# Patient Record
Sex: Female | Born: 1935 | Race: White | Hispanic: No | Marital: Married | Smoking: Former smoker
Health system: Southern US, Community
[De-identification: ages and names within clinical notes are randomized; demographics above are authoritative.]

## PROBLEM LIST (undated history)

## (undated) DIAGNOSIS — Z9229 Personal history of other drug therapy: Secondary | ICD-10-CM

## (undated) DIAGNOSIS — E039 Hypothyroidism, unspecified: Secondary | ICD-10-CM

## (undated) DIAGNOSIS — Z86010 Personal history of colonic polyps: Secondary | ICD-10-CM

## (undated) DIAGNOSIS — I639 Cerebral infarction, unspecified: Secondary | ICD-10-CM

## (undated) DIAGNOSIS — I4891 Unspecified atrial fibrillation: Secondary | ICD-10-CM

## (undated) DIAGNOSIS — M1990 Unspecified osteoarthritis, unspecified site: Secondary | ICD-10-CM

## (undated) DIAGNOSIS — K52831 Collagenous colitis: Secondary | ICD-10-CM

## (undated) DIAGNOSIS — I341 Nonrheumatic mitral (valve) prolapse: Secondary | ICD-10-CM

## (undated) DIAGNOSIS — Z860101 Personal history of adenomatous and serrated colon polyps: Secondary | ICD-10-CM

## (undated) HISTORY — DX: Personal history of adenomatous and serrated colon polyps: Z86.0101

## (undated) HISTORY — DX: Hypothyroidism, unspecified: E03.9

## (undated) HISTORY — DX: Nonrheumatic mitral (valve) prolapse: I34.1

## (undated) HISTORY — DX: Unspecified atrial fibrillation: I48.91

## (undated) HISTORY — DX: Personal history of colonic polyps: Z86.010

## (undated) HISTORY — PX: TONSILLECTOMY: SUR1361

## (undated) HISTORY — DX: Unspecified osteoarthritis, unspecified site: M19.90

## (undated) HISTORY — PX: CHOLECYSTECTOMY: SHX55

## (undated) HISTORY — DX: Collagenous colitis: K52.831

## (undated) HISTORY — DX: Personal history of other drug therapy: Z92.29

## (undated) HISTORY — PX: LUMBAR FUSION: SHX111

## (undated) HISTORY — PX: CATARACT EXTRACTION: SUR2

## (undated) HISTORY — PX: DILATION AND CURETTAGE OF UTERUS: SHX78

## (undated) HISTORY — DX: Cerebral infarction, unspecified: I63.9

---

## 1986-04-28 ENCOUNTER — Encounter (INDEPENDENT_AMBULATORY_CARE_PROVIDER_SITE_OTHER): Payer: Self-pay | Admitting: *Deleted

## 1986-04-30 ENCOUNTER — Encounter (INDEPENDENT_AMBULATORY_CARE_PROVIDER_SITE_OTHER): Payer: Self-pay | Admitting: *Deleted

## 1987-10-05 ENCOUNTER — Encounter (INDEPENDENT_AMBULATORY_CARE_PROVIDER_SITE_OTHER): Payer: Self-pay | Admitting: *Deleted

## 1998-11-08 ENCOUNTER — Encounter: Payer: Self-pay | Admitting: Gastroenterology

## 2002-06-07 ENCOUNTER — Other Ambulatory Visit: Admission: RE | Admit: 2002-06-07 | Discharge: 2002-06-07 | Payer: Self-pay | Admitting: Internal Medicine

## 2003-08-10 ENCOUNTER — Encounter: Admission: RE | Admit: 2003-08-10 | Discharge: 2003-08-10 | Payer: Self-pay | Admitting: Internal Medicine

## 2003-08-22 ENCOUNTER — Encounter: Admission: RE | Admit: 2003-08-22 | Discharge: 2003-08-22 | Payer: Self-pay | Admitting: Internal Medicine

## 2003-11-08 ENCOUNTER — Encounter: Payer: Self-pay | Admitting: Gastroenterology

## 2004-01-18 ENCOUNTER — Ambulatory Visit: Payer: Self-pay | Admitting: Internal Medicine

## 2004-01-30 ENCOUNTER — Ambulatory Visit: Payer: Self-pay | Admitting: Internal Medicine

## 2004-05-17 ENCOUNTER — Ambulatory Visit: Payer: Self-pay | Admitting: Internal Medicine

## 2004-07-17 ENCOUNTER — Ambulatory Visit: Payer: Self-pay | Admitting: Internal Medicine

## 2004-10-17 ENCOUNTER — Ambulatory Visit: Payer: Self-pay | Admitting: Internal Medicine

## 2004-11-06 ENCOUNTER — Inpatient Hospital Stay (HOSPITAL_COMMUNITY): Admission: RE | Admit: 2004-11-06 | Discharge: 2004-11-10 | Payer: Self-pay | Admitting: Orthopaedic Surgery

## 2005-01-17 ENCOUNTER — Ambulatory Visit: Payer: Self-pay | Admitting: Internal Medicine

## 2005-02-20 ENCOUNTER — Ambulatory Visit: Payer: Self-pay | Admitting: Internal Medicine

## 2005-02-20 ENCOUNTER — Other Ambulatory Visit: Admission: RE | Admit: 2005-02-20 | Discharge: 2005-02-20 | Payer: Self-pay | Admitting: Neurosurgery

## 2005-02-20 ENCOUNTER — Encounter: Payer: Self-pay | Admitting: Internal Medicine

## 2005-02-21 ENCOUNTER — Ambulatory Visit: Payer: Self-pay

## 2005-04-23 ENCOUNTER — Ambulatory Visit: Payer: Self-pay | Admitting: Internal Medicine

## 2005-04-28 ENCOUNTER — Ambulatory Visit: Payer: Self-pay | Admitting: Internal Medicine

## 2005-06-24 ENCOUNTER — Ambulatory Visit: Payer: Self-pay | Admitting: Internal Medicine

## 2005-08-22 ENCOUNTER — Encounter: Admission: RE | Admit: 2005-08-22 | Discharge: 2005-08-22 | Payer: Self-pay | Admitting: Internal Medicine

## 2005-08-26 ENCOUNTER — Ambulatory Visit: Payer: Self-pay | Admitting: Internal Medicine

## 2005-09-23 ENCOUNTER — Ambulatory Visit: Payer: Self-pay | Admitting: Internal Medicine

## 2005-10-17 ENCOUNTER — Encounter (INDEPENDENT_AMBULATORY_CARE_PROVIDER_SITE_OTHER): Payer: Self-pay | Admitting: Specialist

## 2005-10-17 ENCOUNTER — Ambulatory Visit (HOSPITAL_COMMUNITY): Admission: RE | Admit: 2005-10-17 | Discharge: 2005-10-17 | Payer: Self-pay | Admitting: Obstetrics and Gynecology

## 2005-12-11 ENCOUNTER — Ambulatory Visit: Payer: Self-pay | Admitting: Internal Medicine

## 2006-01-09 ENCOUNTER — Ambulatory Visit: Payer: Self-pay | Admitting: Internal Medicine

## 2006-02-16 ENCOUNTER — Ambulatory Visit: Payer: Self-pay | Admitting: Internal Medicine

## 2006-02-16 LAB — CONVERTED CEMR LAB
CO2: 29 meq/L (ref 19–32)
Calcium: 9.4 mg/dL (ref 8.4–10.5)
Chloride: 106 meq/L (ref 96–112)
GFR calc non Af Amer: 65 mL/min
Glucose, Bld: 105 mg/dL — ABNORMAL HIGH (ref 70–99)
Phosphorus Concentration: 5 mg/dL — ABNORMAL HIGH (ref 2.3–4.6)
Potassium: 4.4 meq/L (ref 3.5–5.1)
Sodium: 142 meq/L (ref 135–145)

## 2006-02-24 ENCOUNTER — Encounter: Payer: Self-pay | Admitting: Cardiology

## 2006-02-24 ENCOUNTER — Ambulatory Visit: Payer: Self-pay

## 2006-03-17 HISTORY — PX: MITRAL VALVE REPAIR: SHX2039

## 2006-03-30 ENCOUNTER — Ambulatory Visit: Payer: Self-pay | Admitting: Cardiology

## 2006-04-02 ENCOUNTER — Ambulatory Visit: Payer: Self-pay | Admitting: Internal Medicine

## 2006-04-07 ENCOUNTER — Ambulatory Visit: Payer: Self-pay | Admitting: Cardiology

## 2006-04-09 ENCOUNTER — Ambulatory Visit (HOSPITAL_COMMUNITY): Admission: RE | Admit: 2006-04-09 | Discharge: 2006-04-09 | Payer: Self-pay | Admitting: Cardiology

## 2006-04-09 ENCOUNTER — Encounter: Payer: Self-pay | Admitting: Cardiology

## 2006-04-15 ENCOUNTER — Ambulatory Visit: Payer: Self-pay | Admitting: Cardiology

## 2006-04-27 ENCOUNTER — Ambulatory Visit: Payer: Self-pay | Admitting: Cardiology

## 2006-04-27 ENCOUNTER — Ambulatory Visit (HOSPITAL_COMMUNITY): Admission: RE | Admit: 2006-04-27 | Discharge: 2006-04-27 | Payer: Self-pay | Admitting: Cardiovascular Disease

## 2006-04-27 LAB — CONVERTED CEMR LAB: Creatinine, Ser: 0.7 mg/dL (ref 0.4–1.2)

## 2006-05-21 ENCOUNTER — Ambulatory Visit: Payer: Self-pay | Admitting: Internal Medicine

## 2006-07-07 ENCOUNTER — Ambulatory Visit: Payer: Self-pay | Admitting: Internal Medicine

## 2006-07-07 LAB — CONVERTED CEMR LAB
Basophils Absolute: 0 10*3/uL (ref 0.0–0.1)
Basophils Relative: 0.5 % (ref 0.0–1.0)
Eosinophils Absolute: 0.2 10*3/uL (ref 0.0–0.6)
Free T4: 1 ng/dL (ref 0.6–1.6)
HCT: 35 % — ABNORMAL LOW (ref 36.0–46.0)
MCHC: 32.8 g/dL (ref 30.0–36.0)
Monocytes Absolute: 0.5 10*3/uL (ref 0.2–0.7)
Neutro Abs: 2 10*3/uL (ref 1.4–7.7)
Platelets: 308 10*3/uL (ref 150–400)
Potassium: 4.9 meq/L (ref 3.5–5.1)
T3, Free: 5.7 pg/mL — ABNORMAL HIGH (ref 2.3–4.2)
TSH: 0.01 microintl units/mL — ABNORMAL LOW (ref 0.35–5.50)

## 2006-08-11 ENCOUNTER — Ambulatory Visit: Payer: Self-pay | Admitting: Internal Medicine

## 2006-08-12 ENCOUNTER — Ambulatory Visit: Payer: Self-pay | Admitting: Cardiology

## 2006-08-12 LAB — CONVERTED CEMR LAB
Eosinophils Absolute: 0.1 10*3/uL (ref 0.0–0.6)
Eosinophils Relative: 3.7 % (ref 0.0–5.0)
Lymphocytes Relative: 23.4 % (ref 12.0–46.0)
MCHC: 34.4 g/dL (ref 30.0–36.0)
MCV: 92.8 fL (ref 78.0–100.0)
Neutrophils Relative %: 58.7 % (ref 43.0–77.0)
Pap Smear: NORMAL
RBC: 4.07 M/uL (ref 3.87–5.11)
RDW: 12.3 % (ref 11.5–14.6)
WBC: 4 10*3/uL — ABNORMAL LOW (ref 4.5–10.5)

## 2006-09-11 DIAGNOSIS — Z9889 Other specified postprocedural states: Secondary | ICD-10-CM | POA: Insufficient documentation

## 2006-09-11 DIAGNOSIS — M1990 Unspecified osteoarthritis, unspecified site: Secondary | ICD-10-CM | POA: Insufficient documentation

## 2006-09-14 ENCOUNTER — Ambulatory Visit: Payer: Self-pay | Admitting: Internal Medicine

## 2006-09-14 LAB — CONVERTED CEMR LAB
Free T4: 1.1 ng/dL (ref 0.6–1.6)
TSH: <0.03 microintl units/mL — ABNORMAL LOW (ref 0.35–5.50)

## 2006-09-15 ENCOUNTER — Telehealth: Payer: Self-pay | Admitting: *Deleted

## 2006-09-16 ENCOUNTER — Ambulatory Visit: Payer: Self-pay | Admitting: Internal Medicine

## 2006-09-21 ENCOUNTER — Encounter: Payer: Self-pay | Admitting: Internal Medicine

## 2006-09-21 ENCOUNTER — Ambulatory Visit: Payer: Self-pay | Admitting: Internal Medicine

## 2006-10-09 ENCOUNTER — Encounter: Payer: Self-pay | Admitting: Internal Medicine

## 2006-11-06 ENCOUNTER — Ambulatory Visit: Payer: Self-pay | Admitting: Cardiology

## 2006-11-23 ENCOUNTER — Ambulatory Visit: Payer: Self-pay | Admitting: Internal Medicine

## 2006-11-23 DIAGNOSIS — M8580 Other specified disorders of bone density and structure, unspecified site: Secondary | ICD-10-CM | POA: Insufficient documentation

## 2006-11-23 DIAGNOSIS — E039 Hypothyroidism, unspecified: Secondary | ICD-10-CM | POA: Insufficient documentation

## 2006-11-23 LAB — CONVERTED CEMR LAB: T3 Uptake Ratio: 45.9 % — ABNORMAL HIGH (ref 22.5–37.0)

## 2006-12-18 ENCOUNTER — Ambulatory Visit: Payer: Self-pay | Admitting: Internal Medicine

## 2007-02-24 ENCOUNTER — Ambulatory Visit: Payer: Self-pay | Admitting: Internal Medicine

## 2007-02-24 DIAGNOSIS — M19049 Primary osteoarthritis, unspecified hand: Secondary | ICD-10-CM | POA: Insufficient documentation

## 2007-04-01 ENCOUNTER — Ambulatory Visit: Payer: Self-pay | Admitting: Internal Medicine

## 2007-05-25 ENCOUNTER — Encounter: Payer: Self-pay | Admitting: Cardiology

## 2007-05-25 ENCOUNTER — Ambulatory Visit: Payer: Self-pay | Admitting: Cardiology

## 2007-05-25 ENCOUNTER — Ambulatory Visit: Payer: Self-pay

## 2007-06-11 ENCOUNTER — Encounter: Admission: RE | Admit: 2007-06-11 | Discharge: 2007-06-11 | Payer: Self-pay | Admitting: Orthopaedic Surgery

## 2007-07-07 ENCOUNTER — Ambulatory Visit: Payer: Self-pay | Admitting: Internal Medicine

## 2007-07-07 DIAGNOSIS — I480 Paroxysmal atrial fibrillation: Secondary | ICD-10-CM | POA: Insufficient documentation

## 2007-07-07 DIAGNOSIS — I4891 Unspecified atrial fibrillation: Secondary | ICD-10-CM | POA: Insufficient documentation

## 2007-07-07 LAB — CONVERTED CEMR LAB
BUN: 24 mg/dL — ABNORMAL HIGH (ref 6–23)
Chloride: 103 meq/L (ref 96–112)
Creatinine, Ser: 0.8 mg/dL (ref 0.4–1.2)
GFR calc non Af Amer: 75 mL/min
Sodium: 142 meq/L (ref 135–145)

## 2007-08-04 ENCOUNTER — Encounter: Payer: Self-pay | Admitting: Internal Medicine

## 2007-10-15 ENCOUNTER — Encounter: Payer: Self-pay | Admitting: Internal Medicine

## 2007-11-04 ENCOUNTER — Ambulatory Visit: Payer: Self-pay | Admitting: Internal Medicine

## 2007-12-24 ENCOUNTER — Encounter: Admission: RE | Admit: 2007-12-24 | Discharge: 2007-12-24 | Payer: Self-pay | Admitting: Orthopaedic Surgery

## 2007-12-28 ENCOUNTER — Ambulatory Visit: Payer: Self-pay | Admitting: Internal Medicine

## 2008-01-25 ENCOUNTER — Encounter: Payer: Self-pay | Admitting: Internal Medicine

## 2008-01-31 ENCOUNTER — Ambulatory Visit: Payer: Self-pay | Admitting: Internal Medicine

## 2008-01-31 LAB — CONVERTED CEMR LAB
BUN: 27 mg/dL — ABNORMAL HIGH (ref 6–23)
CO2: 28 meq/L (ref 19–32)
Calcium: 9.6 mg/dL (ref 8.4–10.5)
Chloride: 106 meq/L (ref 96–112)
Creatinine, Ser: 0.8 mg/dL (ref 0.4–1.2)
Free T4: 2.8 ng/dL — ABNORMAL HIGH (ref 0.6–1.6)
GFR calc Af Amer: 90 mL/min
GFR calc non Af Amer: 75 mL/min
Glucose, Bld: 95 mg/dL (ref 70–99)
Potassium: 4.2 meq/L (ref 3.5–5.1)
Sodium: 141 meq/L (ref 135–145)
T3, Free: 3.6 pg/mL (ref 2.3–4.2)
TSH: 0.22 microintl units/mL — ABNORMAL LOW (ref 0.35–5.50)
Vit D, 1,25-Dihydroxy: 51 (ref 30–89)

## 2008-03-28 ENCOUNTER — Telehealth (INDEPENDENT_AMBULATORY_CARE_PROVIDER_SITE_OTHER): Payer: Self-pay | Admitting: *Deleted

## 2008-05-31 ENCOUNTER — Ambulatory Visit: Payer: Self-pay | Admitting: Internal Medicine

## 2008-05-31 DIAGNOSIS — L02519 Cutaneous abscess of unspecified hand: Secondary | ICD-10-CM | POA: Insufficient documentation

## 2008-05-31 DIAGNOSIS — L03119 Cellulitis of unspecified part of limb: Secondary | ICD-10-CM

## 2008-06-13 ENCOUNTER — Telehealth: Payer: Self-pay | Admitting: Internal Medicine

## 2008-06-29 ENCOUNTER — Ambulatory Visit: Payer: Self-pay | Admitting: Internal Medicine

## 2008-06-30 ENCOUNTER — Encounter: Payer: Self-pay | Admitting: Internal Medicine

## 2008-07-20 ENCOUNTER — Telehealth: Payer: Self-pay | Admitting: Internal Medicine

## 2008-07-21 ENCOUNTER — Encounter: Payer: Self-pay | Admitting: Internal Medicine

## 2008-07-27 ENCOUNTER — Telehealth: Payer: Self-pay | Admitting: Internal Medicine

## 2008-07-28 ENCOUNTER — Encounter: Payer: Self-pay | Admitting: Internal Medicine

## 2008-08-01 ENCOUNTER — Telehealth: Payer: Self-pay | Admitting: Speech Pathology

## 2008-08-29 ENCOUNTER — Ambulatory Visit: Payer: Self-pay | Admitting: Internal Medicine

## 2008-08-29 LAB — CONVERTED CEMR LAB: TSH: 0.11 microintl units/mL — ABNORMAL LOW (ref 0.35–5.50)

## 2008-09-05 ENCOUNTER — Ambulatory Visit: Payer: Self-pay | Admitting: Internal Medicine

## 2008-09-05 DIAGNOSIS — J309 Allergic rhinitis, unspecified: Secondary | ICD-10-CM | POA: Insufficient documentation

## 2008-09-14 ENCOUNTER — Encounter (INDEPENDENT_AMBULATORY_CARE_PROVIDER_SITE_OTHER): Payer: Self-pay | Admitting: *Deleted

## 2008-09-26 ENCOUNTER — Telehealth: Payer: Self-pay | Admitting: Cardiology

## 2008-10-16 ENCOUNTER — Encounter: Payer: Self-pay | Admitting: Internal Medicine

## 2008-10-17 ENCOUNTER — Ambulatory Visit: Payer: Self-pay | Admitting: Gastroenterology

## 2008-10-23 ENCOUNTER — Ambulatory Visit: Payer: Self-pay | Admitting: Internal Medicine

## 2008-10-23 ENCOUNTER — Encounter: Payer: Self-pay | Admitting: Internal Medicine

## 2008-10-23 LAB — HM DEXA SCAN

## 2008-11-06 ENCOUNTER — Encounter: Payer: Self-pay | Admitting: Cardiology

## 2008-11-06 ENCOUNTER — Ambulatory Visit: Payer: Self-pay

## 2008-11-07 ENCOUNTER — Ambulatory Visit: Payer: Self-pay | Admitting: Gastroenterology

## 2008-11-09 ENCOUNTER — Ambulatory Visit: Payer: Self-pay | Admitting: Cardiology

## 2008-12-26 ENCOUNTER — Ambulatory Visit: Payer: Self-pay | Admitting: Internal Medicine

## 2008-12-26 LAB — CONVERTED CEMR LAB
ALT: 17 units/L (ref 0–35)
Alkaline Phosphatase: 68 units/L (ref 39–117)
Basophils Absolute: 0 10*3/uL (ref 0.0–0.1)
Basophils Relative: 0.8 % (ref 0.0–3.0)
Bilirubin, Direct: 0.1 mg/dL (ref 0.0–0.3)
CO2: 30 meq/L (ref 19–32)
Direct LDL: 146.9 mg/dL
Eosinophils Absolute: 0.1 10*3/uL (ref 0.0–0.7)
GFR calc non Af Amer: 74.21 mL/min (ref >60)
Glucose, Bld: 104 mg/dL — ABNORMAL HIGH (ref 70–99)
Glucose, Urine, Semiquant: NEGATIVE
HCT: 41 % (ref 36.0–46.0)
Lymphs Abs: 1.3 10*3/uL (ref 0.7–4.0)
MCV: 101.5 fL — ABNORMAL HIGH (ref 78.0–100.0)
Monocytes Absolute: 0.4 10*3/uL (ref 0.1–1.0)
Neutro Abs: 2.5 10*3/uL (ref 1.4–7.7)
Neutrophils Relative %: 57.8 % (ref 43.0–77.0)
Platelets: 218 10*3/uL (ref 150.0–400.0)
RBC: 4.04 M/uL (ref 3.87–5.11)
RDW: 13.1 % (ref 11.5–14.6)
Sodium: 143 meq/L (ref 135–145)
Total Protein: 7.2 g/dL (ref 6.0–8.3)
Triglycerides: 97 mg/dL (ref 0.0–149.0)
Urobilinogen, UA: 0.2
VLDL: 19.4 mg/dL (ref 0.0–40.0)
pH: 5.5

## 2009-01-03 ENCOUNTER — Ambulatory Visit: Payer: Self-pay | Admitting: Internal Medicine

## 2009-01-03 DIAGNOSIS — M4120 Other idiopathic scoliosis, site unspecified: Secondary | ICD-10-CM | POA: Insufficient documentation

## 2009-03-06 ENCOUNTER — Ambulatory Visit: Payer: Self-pay | Admitting: Internal Medicine

## 2009-03-06 DIAGNOSIS — I7300 Raynaud's syndrome without gangrene: Secondary | ICD-10-CM | POA: Insufficient documentation

## 2009-04-17 ENCOUNTER — Ambulatory Visit: Payer: Self-pay | Admitting: Internal Medicine

## 2009-04-17 DIAGNOSIS — B354 Tinea corporis: Secondary | ICD-10-CM | POA: Insufficient documentation

## 2009-06-04 ENCOUNTER — Ambulatory Visit: Payer: Self-pay | Admitting: Internal Medicine

## 2009-06-04 DIAGNOSIS — K219 Gastro-esophageal reflux disease without esophagitis: Secondary | ICD-10-CM | POA: Insufficient documentation

## 2009-06-04 LAB — CONVERTED CEMR LAB
Free T4: 2.7 ng/dL — ABNORMAL HIGH (ref 0.6–1.6)
T3, Free: 3 pg/mL (ref 2.3–4.2)
TSH: 0.12 microintl units/mL — ABNORMAL LOW (ref 0.35–5.50)

## 2009-07-02 ENCOUNTER — Telehealth: Payer: Self-pay | Admitting: Internal Medicine

## 2009-07-17 ENCOUNTER — Telehealth: Payer: Self-pay | Admitting: Internal Medicine

## 2009-07-20 ENCOUNTER — Encounter: Payer: Self-pay | Admitting: Internal Medicine

## 2009-07-23 ENCOUNTER — Telehealth: Payer: Self-pay | Admitting: Internal Medicine

## 2009-07-26 ENCOUNTER — Telehealth: Payer: Self-pay | Admitting: Internal Medicine

## 2009-08-15 ENCOUNTER — Telehealth: Payer: Self-pay | Admitting: Internal Medicine

## 2009-08-27 ENCOUNTER — Telehealth: Payer: Self-pay | Admitting: Internal Medicine

## 2009-08-28 ENCOUNTER — Ambulatory Visit: Payer: Self-pay | Admitting: Internal Medicine

## 2009-08-30 ENCOUNTER — Encounter: Payer: Self-pay | Admitting: Internal Medicine

## 2009-09-03 ENCOUNTER — Telehealth (INDEPENDENT_AMBULATORY_CARE_PROVIDER_SITE_OTHER): Payer: Self-pay | Admitting: *Deleted

## 2009-09-03 DIAGNOSIS — R197 Diarrhea, unspecified: Secondary | ICD-10-CM | POA: Insufficient documentation

## 2009-09-05 ENCOUNTER — Ambulatory Visit: Payer: Self-pay | Admitting: Gastroenterology

## 2009-09-05 ENCOUNTER — Emergency Department (HOSPITAL_COMMUNITY): Admission: EM | Admit: 2009-09-05 | Discharge: 2009-09-05 | Payer: Self-pay | Admitting: Emergency Medicine

## 2009-09-05 ENCOUNTER — Telehealth (INDEPENDENT_AMBULATORY_CARE_PROVIDER_SITE_OTHER): Payer: Self-pay | Admitting: *Deleted

## 2009-09-05 ENCOUNTER — Encounter: Payer: Self-pay | Admitting: Cardiology

## 2009-09-05 ENCOUNTER — Encounter: Payer: Self-pay | Admitting: Nurse Practitioner

## 2009-09-05 ENCOUNTER — Ambulatory Visit: Payer: Self-pay | Admitting: Cardiovascular Disease

## 2009-09-10 ENCOUNTER — Telehealth: Payer: Self-pay | Admitting: Nurse Practitioner

## 2009-09-11 ENCOUNTER — Ambulatory Visit: Payer: Self-pay | Admitting: Nurse Practitioner

## 2009-09-11 ENCOUNTER — Encounter: Payer: Self-pay | Admitting: Internal Medicine

## 2009-09-11 LAB — CONVERTED CEMR LAB
IgA: 127 mg/dL (ref 68–378)
Tissue Transglutaminase Ab, IgA: 5.7 units (ref <20)

## 2009-09-19 ENCOUNTER — Encounter (INDEPENDENT_AMBULATORY_CARE_PROVIDER_SITE_OTHER): Payer: Self-pay | Admitting: *Deleted

## 2009-09-20 ENCOUNTER — Telehealth: Payer: Self-pay | Admitting: Nurse Practitioner

## 2009-09-20 ENCOUNTER — Encounter (INDEPENDENT_AMBULATORY_CARE_PROVIDER_SITE_OTHER): Payer: Self-pay | Admitting: *Deleted

## 2009-09-20 DIAGNOSIS — R198 Other specified symptoms and signs involving the digestive system and abdomen: Secondary | ICD-10-CM | POA: Insufficient documentation

## 2009-09-21 ENCOUNTER — Ambulatory Visit: Payer: Self-pay | Admitting: Gastroenterology

## 2009-09-24 ENCOUNTER — Ambulatory Visit: Payer: Self-pay | Admitting: Cardiology

## 2009-09-25 ENCOUNTER — Encounter: Payer: Self-pay | Admitting: Internal Medicine

## 2009-09-27 ENCOUNTER — Ambulatory Visit: Payer: Self-pay | Admitting: Gastroenterology

## 2009-09-28 ENCOUNTER — Telehealth: Payer: Self-pay | Admitting: Gastroenterology

## 2009-10-01 ENCOUNTER — Encounter: Payer: Self-pay | Admitting: Gastroenterology

## 2009-10-02 ENCOUNTER — Encounter: Payer: Self-pay | Admitting: Gastroenterology

## 2009-10-11 ENCOUNTER — Encounter: Payer: Self-pay | Admitting: Cardiology

## 2009-10-11 ENCOUNTER — Ambulatory Visit: Payer: Self-pay

## 2009-10-11 ENCOUNTER — Ambulatory Visit (HOSPITAL_COMMUNITY): Admission: RE | Admit: 2009-10-11 | Discharge: 2009-10-11 | Payer: Self-pay | Admitting: Cardiology

## 2009-10-11 ENCOUNTER — Encounter (INDEPENDENT_AMBULATORY_CARE_PROVIDER_SITE_OTHER): Payer: Self-pay | Admitting: *Deleted

## 2009-10-11 ENCOUNTER — Ambulatory Visit: Payer: Self-pay | Admitting: Cardiology

## 2009-10-22 ENCOUNTER — Ambulatory Visit: Payer: Self-pay | Admitting: Gastroenterology

## 2009-10-22 DIAGNOSIS — Z860100 Personal history of colon polyps, unspecified: Secondary | ICD-10-CM | POA: Insufficient documentation

## 2009-10-22 DIAGNOSIS — K5289 Other specified noninfective gastroenteritis and colitis: Secondary | ICD-10-CM | POA: Insufficient documentation

## 2009-10-29 ENCOUNTER — Encounter: Payer: Self-pay | Admitting: Internal Medicine

## 2009-11-26 ENCOUNTER — Ambulatory Visit: Payer: Self-pay | Admitting: Cardiology

## 2009-12-03 ENCOUNTER — Ambulatory Visit: Payer: Self-pay | Admitting: Cardiovascular Disease

## 2009-12-03 LAB — CONVERTED CEMR LAB: INR: 2.8

## 2009-12-10 ENCOUNTER — Ambulatory Visit: Payer: Self-pay | Admitting: Cardiovascular Disease

## 2009-12-17 ENCOUNTER — Ambulatory Visit: Payer: Self-pay | Admitting: Internal Medicine

## 2009-12-17 LAB — CONVERTED CEMR LAB: POC INR: 2.9

## 2009-12-19 ENCOUNTER — Ambulatory Visit: Payer: Self-pay | Admitting: Internal Medicine

## 2009-12-19 LAB — CONVERTED CEMR LAB
AST: 32 units/L (ref 0–37)
Albumin: 3.9 g/dL (ref 3.5–5.2)
Alkaline Phosphatase: 77 units/L (ref 39–117)
Basophils Absolute: 0.1 10*3/uL (ref 0.0–0.1)
Bilirubin, Direct: 0.1 mg/dL (ref 0.0–0.3)
Blood in Urine, dipstick: NEGATIVE
Calcium: 9.8 mg/dL (ref 8.4–10.5)
Cholesterol: 246 mg/dL — ABNORMAL HIGH (ref 0–200)
Creatinine, Ser: 0.9 mg/dL (ref 0.4–1.2)
Eosinophils Absolute: 0.2 10*3/uL (ref 0.0–0.7)
GFR calc non Af Amer: 69.02 mL/min (ref >60)
Glucose, Bld: 93 mg/dL (ref 70–99)
HDL: 79.3 mg/dL (ref >39.00)
Hemoglobin: 12.9 g/dL (ref 12.0–15.0)
Lymphocytes Relative: 24.9 % (ref 12.0–46.0)
Monocytes Relative: 8.6 % (ref 3.0–12.0)
Neutro Abs: 4.1 10*3/uL (ref 1.4–7.7)
Neutrophils Relative %: 62.7 % (ref 43.0–77.0)
Nitrite: NEGATIVE
Protein, U semiquant: NEGATIVE
RBC: 3.76 M/uL — ABNORMAL LOW (ref 3.87–5.11)
RDW: 13.5 % (ref 11.5–14.6)
Sodium: 145 meq/L (ref 135–145)
Total CHOL/HDL Ratio: 3
Triglycerides: 77 mg/dL (ref 0.0–149.0)
Urobilinogen, UA: 0.2

## 2009-12-24 ENCOUNTER — Ambulatory Visit: Payer: Self-pay | Admitting: Cardiology

## 2009-12-24 LAB — CONVERTED CEMR LAB: POC INR: 2.4

## 2009-12-26 ENCOUNTER — Ambulatory Visit: Payer: Self-pay | Admitting: Internal Medicine

## 2010-01-07 ENCOUNTER — Ambulatory Visit: Payer: Self-pay | Admitting: Internal Medicine

## 2010-01-07 LAB — CONVERTED CEMR LAB: POC INR: 2.6

## 2010-02-04 ENCOUNTER — Ambulatory Visit: Payer: Self-pay | Admitting: Internal Medicine

## 2010-02-04 LAB — CONVERTED CEMR LAB: INR: 3.1

## 2010-02-25 ENCOUNTER — Telehealth: Payer: Self-pay | Admitting: Internal Medicine

## 2010-03-04 ENCOUNTER — Ambulatory Visit: Payer: Self-pay | Admitting: Internal Medicine

## 2010-03-26 ENCOUNTER — Ambulatory Visit
Admission: RE | Admit: 2010-03-26 | Discharge: 2010-03-26 | Payer: Self-pay | Source: Home / Self Care | Attending: Internal Medicine | Admitting: Internal Medicine

## 2010-04-07 ENCOUNTER — Encounter: Payer: Self-pay | Admitting: Cardiovascular Disease

## 2010-04-08 ENCOUNTER — Ambulatory Visit
Admission: RE | Admit: 2010-04-08 | Discharge: 2010-04-08 | Payer: Self-pay | Source: Home / Self Care | Attending: Internal Medicine | Admitting: Internal Medicine

## 2010-04-08 LAB — CONVERTED CEMR LAB: INR: 2.2

## 2010-04-16 NOTE — Progress Notes (Signed)
Summary: stool culture results.  Phone Note Call from Patient   Caller: Patient Call For: Stacie Glaze MD Reason for Call: Privacy/Consent Authorization Summary of Call: Pt is asking for stool results. [phone] Is still having diarrhea. Initial call taken by: Lynann Beaver CMA,  Jul 23, 2009 11:11 AM  Follow-up for Phone Call        per dr Lovell Sheehan- all neg an-start colesit 1scoop once daily  Follow-up by: Willy Eddy, LPN,  Jul 24, 8839 11:30 AM    New/Updated Medications: COLESTID 5 GM PACK (COLESTIPOL HCL) 1 pack once daily Prescriptions: COLESTID 5 GM PACK (COLESTIPOL HCL) 1 pack once daily  #30 x 3   Entered by:   Willy Eddy, LPN   Authorized by:   Stacie Glaze MD   Signed by:   Willy Eddy, LPN on 66/08/3014   Method used:   Electronically to        [facility]* (retail)       [address] [place], [place]  [address]       Ph: [phone]       Fax: [phone]   RxID:   [id number]

## 2010-04-16 NOTE — Progress Notes (Signed)
Summary: Diarrhea  Phone Note From Other Clinic   Caller: [facility] @ Dr Lovell Sheehan Call For: Dr Russella Dar Summary of Call: Diarrhea for 3 or 23month. Would like pt seen this week. Call back to [place] at [phone] Initial call taken by: Leanor Kail [facility],  September 03, 2009 11:23 AM  Follow-up for Phone Call        scheduled to see Willette Cluster RNP 09/05/09 1:30 Follow-up by: Darcey Lee-Ann RN, CGRN,  September 03, 2009 11:52 AM

## 2010-04-16 NOTE — Miscellaneous (Signed)
Summary: gi med entocort  Clinical Lists Changes  Medications: Added new medication of ENTOCORT EC 3 MG  CP24 (BUDESONIDE) Take 3 capsules daily - Signed Rx of ENTOCORT EC 3 MG  CP24 (BUDESONIDE) Take 3 capsules daily;  #90 x 2;  Signed;  Entered by: Eual Fines RN;  Authorized by: Meryl Dare MD Clementeen Graham;  Method used: Electronically to [facility]*, [address] [place], [place], [place]  [address], Ph: [phone], Fax: [phone] Observations: Added new observation of ALLERGY REV: Done (10/02/2009 13:20)    Prescriptions: ENTOCORT EC 3 MG  CP24 (BUDESONIDE) Take 3 capsules daily  #90 x 2   Entered by:   Eual Fines RN   Authorized by:   Meryl Dare MD [facility]   Signed by:   Eual Fines RN on 10/02/2009   Method used:   Electronically to        [facility]* (retail)       [address] [place], [place]  [address]       Ph: [phone]       Fax: [phone]   RxID:   [id number]

## 2010-04-16 NOTE — Progress Notes (Signed)
Summary: sooner appt  Phone Note Call from Patient Call back at Home Phone [phone]   Caller: Patient Call For: Theresa [name] Reason for Call: Talk to Nurse Summary of Call: Patient states that Dr Theresa [name] wanted to see her for a 2-3 weeks f.u after procedure but his first available is 8-31. Initial call taken by: Tawni Levy,  September 28, 2009 4:08 PM  Follow-up for Phone Call        rev scheduled for 10/22/09 10:15.  patient aware Follow-up by: Darcey Nikie RN, CGRN,  October 01, 2009 8:28 AM

## 2010-04-16 NOTE — Progress Notes (Signed)
Summary: Schedule colonoscopy   Celiac testing negative. Proceed with colonoscopy with biopsies.   ---- Converted from flag ---- ---- 09/20/2009 2:21 PM, Lowry Ram NCMA wrote:   ---- 09/13/2009 1:06 PM, Willette Cluster NP wrote: Elita Quick, please get let patient know this plan. Thanks  ---- 09/11/2009 9:02 AM, Meryl Dare MD [facility] wrote: Await celiac panel, and if negative and diarrhea persists, would proceed to colonoscopy as lymphocytic colitis/collagenous colitis can be missed with left sided biopsies from a flex sig.  ---- 09/11/2009 12:05 AM, Willette Cluster NP wrote: This is your patient, please see my office note. I plan to get celiac panel and if negative set her up for flex with biopsies. Your thoughts ??? Thanks ------------------------------  Appended Document: Schedule colonoscopy Made pt appt for Previsit for 09-21-09 Rm 50  at 10:00 AM.  Made appt for the Colonoscopy for 09-27-09 at 3:30 PM with Dr. Claudette Head.  Pt aware.

## 2010-04-16 NOTE — Letter (Signed)
Summary: [facility] Instructions  [facility]  [address] [place], [place] [address]   Phone: [phone]  Fax: [phone]       ATLANTIS DELONG    10/15/1931    MRN: [id number]        Procedure Day /Date:  Thursday 09/27/2009     Arrival Time: 2:30 pm      Procedure Time:  3:30 pm     Location of Procedure:                    _ x_  [facility] (4th Floor)                        PREPARATION FOR COLONOSCOPY WITH MOVIPREP   Starting 5 days prior to your procedure Saturday 7/9 do not eat nuts, seeds, popcorn, corn, beans, peas,  salads, or any raw vegetables.  Do not take any fiber supplements (e.g. Metamucil, Citrucel, and Benefiber).  THE DAY BEFORE YOUR PROCEDURE         DATE: Wednesday 7/13  1.  Drink clear liquids the entire day-NO SOLID FOOD  2.  Do not drink anything colored red or purple.  Avoid juices with pulp.  No orange juice.  3.  Drink at least 64 oz. (8 glasses) of fluid/clear liquids during the day to prevent dehydration and help the prep work efficiently.  CLEAR LIQUIDS INCLUDE: Water Jello Ice Popsicles Tea (sugar ok, no milk/cream) Powdered fruit flavored drinks Coffee (sugar ok, no milk/cream) Gatorade Juice: apple, white grape, white cranberry  Lemonade Clear bullion, consomm, broth Carbonated beverages (any kind) Strained chicken noodle soup Hard Candy                             4.  In the morning, mix first dose of MoviPrep solution:    Empty 1 Pouch A and 1 Pouch B into the disposable container    Add lukewarm drinking water to the top line of the container. Mix to dissolve    Refrigerate (mixed solution should be used within 24 hrs)  5.  Begin drinking the prep at 5:00 p.m. The MoviPrep container is divided by 4 marks.   Every 15 minutes drink the solution down to the next mark (approximately 8 oz) until the full liter is complete.   6.  Follow completed prep with 16 oz of clear liquid of your choice  (Nothing red or purple).  Continue to drink clear liquids until bedtime.  7.  Before going to bed, mix second dose of MoviPrep solution:    Empty 1 Pouch A and 1 Pouch B into the disposable container    Add lukewarm drinking water to the top line of the container. Mix to dissolve    Refrigerate  THE DAY OF YOUR PROCEDURE      DATE: Thursday 7/14  Beginning at 10:30 a.m. (5 hours before procedure):         1. Every 15 minutes, drink the solution down to the next mark (approx 8 oz) until the full liter is complete.  2. Follow completed prep with 16 oz. of clear liquid of your choice.    3. You may drink clear liquids until 1:30 pm (2 HOURS BEFORE PROCEDURE).   MEDICATION INSTRUCTIONS  Unless otherwise instructed, you should take regular prescription medications with a small sip of water   as early as possible the morning  of your procedure.           OTHER INSTRUCTIONS  You will need a responsible adult at least 75 years of age to accompany you and drive you home.   This person must remain in the waiting room during your procedure.  Wear loose fitting clothing that is easily removed.  Leave jewelry and other valuables at home.  However, you may wish to bring a book to read or  an iPod/MP3 player to listen to music as you wait for your procedure to start.  Remove all body piercing jewelry and leave at home.  Total time from sign-in until discharge is approximately 2-3 hours.  You should go home directly after your procedure and rest.  You can resume normal activities the  day after your procedure.  The day of your procedure you should not:   Drive   Make legal decisions   Operate machinery   Drink alcohol   Return to work  You will receive specific instructions about eating, activities and medications before you leave.    The above instructions have been reviewed and explained to me by   Ezra Sites RN  September 21, 2009 10:22 AM     I fully understand  and can verbalize these instructions _____________________________ Date _________

## 2010-04-16 NOTE — Assessment & Plan Note (Signed)
Summary: 3 MNTH ROV//SLM   Vital Signs:  Patient profile:   77 year old female Height:      62 inches Weight:      117 pounds BMI:     21.48 Temp:     98.4 degrees F rectal Pulse rate:   72 / minute Resp:     14 per minute BP sitting:   116 / 70  (left arm)  Vitals Entered By: Willy Eddy, LPN (June 04, 2009 10:41 AM) CC: roa, Abdominal Pain   Primary Care Provider:  Stacie Glaze MD  CC:  roa and Abdominal Pain.  History of Present Illness: monitering for the OA and had finger pain thjat was relieved with the shots the pt has been stable otherwize  Dyspepsia History:      She has no alarm features of dyspepsia including no history of melena, hematochezia, dysphagia, persistent vomiting, or involuntary weight loss > 5%.  There is a prior history of GERD.  She notes that she has never had an upper endoscopy or GI consultation.     Preventive Screening-Counseling & Management  Alcohol-Tobacco     Smoking Status: quit     Year Quit: 1962  Problems Prior to Update: 1)  Dermatophytosis of The Body  (ICD-110.5) 2)  Raynaud's Syndrome  (ICD-443.0) 3)  Scoliosis, Lumbar Spine  (ICD-737.30) 4)  Preventive Health Care  (ICD-V70.0) 5)  Vasomotor Rhinitis  (ICD-477.9) 6)  Cellulitis, Hand, Left  (ICD-682.4) 7)  Osteoarthritis, Hands, Bilateral  (ICD-715.94) 8)  Osteoporosis  (ICD-733.00) 9)  Hypothyroidism  (ICD-244.9) 10)  Mitral Regurgitation  (ICD-424.0) 11)  Mitral Valve Prolapse  (ICD-424.0) 12)  Osteoarthritis  (ICD-715.90) 13)  Hx of Atrial Fibrillation  (ICD-427.31)  Current Problems (verified): 1)  Dermatophytosis of The Body  (ICD-110.5) 2)  Raynaud's Syndrome  (ICD-443.0) 3)  Scoliosis, Lumbar Spine  (ICD-737.30) 4)  Preventive Health Care  (ICD-V70.0) 5)  Vasomotor Rhinitis  (ICD-477.9) 6)  Cellulitis, Hand, Left  (ICD-682.4) 7)  Osteoarthritis, Hands, Bilateral  (ICD-715.94) 8)  Osteoporosis  (ICD-733.00) 9)  Hypothyroidism  (ICD-244.9) 10)   Mitral Regurgitation  (ICD-424.0) 11)  Mitral Valve Prolapse  (ICD-424.0) 12)  Osteoarthritis  (ICD-715.90) 13)  Hx of Atrial Fibrillation  (ICD-427.31)  Medications Prior to Update: 1)  Synthroid 100 Mcg  Tabs (Levothyroxine Sodium) .... Take 1 Tablet By Mouth Once A Day 2)  Cleocin-T 1 % Lotn (Clindamycin Phosphate) .... Apply As Directed 3)  Caltrate 600+d   Tabs (Calcium Carbonate-Vitamin D Tabs) .... Take 1 Tablet By Mouth Two Times A Day 4)  Aspirin 325  Mg  Tabs (Aspirin) .... Take 2 Tablet By Mouth Two Times A Day 5)  Triveen-Cf Nac 6-2-600 Mg Tabs (Methylfol-Methylcob-Acetylcyst) .... One P0 Daily ( Hold For Her To Calls) 6)  Fish Oil   Caps (Omega-3 Fatty Acids Caps) .... Take 1 By Mouth Two Times A Day Qd 7)  Prilosec 20 Mg  Cpdr (Omeprazole) .... One By Mouth Q Od 8)  Metoprolol Succinate 50 Mg  Tb24 (Metoprolol Succinate) .... One in Am and 1/2 in Pm 9)  Glucosamine-Chondroitin 500-400 Mg  Caps (Glucosamine-Chondroitin) .... Two Times A Day 10)  Vitamin D 1000 Unit  Caps (Cholecalciferol) .... Once Daily 11)  Ultram 50 Mg  Tabs (Tramadol Hcl) .... One By Mouth Q 6 Hours As Needed Pain 12)  Muscadine Pus .... Daily 13)  Vitamin C 500 Mg  Tabs (Ascorbic Acid) .Marland Kitchen.. 1 Tab Once Daily 14)  Vitamin  A .... 1 Tab Once Daily 15)  Multi V .... 1 Tab Once Daily 16)  Cyclobenzaprine Hcl 5 Mg Tabs (Cyclobenzaprine Hcl) .... One By Mouth Q Hs 17)  Fluconazole 100 Mg Tabs (Fluconazole) .... One By Mouth Daily For 10 Day  Current Medications (verified): 1)  Synthroid 100 Mcg  Tabs (Levothyroxine Sodium) .... Take 1 Tablet By Mouth Once A Day 2)  Cleocin-T 1 % Lotn (Clindamycin Phosphate) .... Apply As Directed 3)  Caltrate 600+d   Tabs (Calcium Carbonate-Vitamin D Tabs) .... Take 1 Tablet By Mouth Two Times A Day 4)  Aspirin 325  Mg  Tabs (Aspirin) .... Take 2 Tablet By Mouth Two Times A Day 5)  Triveen-Cf Nac 6-2-600 Mg Tabs (Methylfol-Methylcob-Acetylcyst) .... One P0 Daily ( Hold For Her  To Calls) 6)  Fish Oil   Caps (Omega-3 Fatty Acids Caps) .... Take 1 By Mouth Two Times A Day Qd 7)  Prilosec 20 Mg  Cpdr (Omeprazole) .... One By Mouth Q Od 8)  Metoprolol Succinate 50 Mg  Tb24 (Metoprolol Succinate) .... One in Am and 1/2 in Pm 9)  Glucosamine-Chondroitin 500-400 Mg  Caps (Glucosamine-Chondroitin) .... Two Times A Day 10)  Vitamin D 1000 Unit  Caps (Cholecalciferol) .... Once Daily 11)  Ultram 50 Mg  Tabs (Tramadol Hcl) .... One By Mouth Q 6 Hours As Needed Pain 12)  Muscadine Pus .... Daily 13)  Vitamin C 500 Mg  Tabs (Ascorbic Acid) .Marland Kitchen.. 1 Tab Once Daily 14)  Vitamin A .Marland Kitchen.. 1 Tab Once Daily 15)  Multi V .... 1 Tab Once Daily 16)  Cyclobenzaprine Hcl 5 Mg Tabs (Cyclobenzaprine Hcl) .... One By Mouth Q Hs  Allergies (verified): 1)  ! Vioxx 2)  ! * Voltaren Eye Gtts. and Gel  Past History:  Family History: Last updated: 09/11/2006 Family History of Stroke F 1st degree relative <60  Social History: Last updated: 09/11/2006 Married Former Smoker  Risk Factors: Smoking Status: quit (06/04/2009)  Past medical, surgical, family and social histories (including risk factors) reviewed, and no changes noted (except as noted below).  Past Medical History: Reviewed history from 11/08/2008 and no changes required. Atrial fibrillation Osteoarthritis HRT-ovarian MVP Hypothyroidism Osteoporosis  1. Myxomatous degeneration of the mitral valve, status post mitral     valve repair with a good result and now stable. 2. Postoperative atrial fibrillation with no evidence of recurrence.   Past Surgical History: Reviewed history from 05/31/2008 and no changes required. Cholecystectomy Lumbar fusion Tonsillectomy GYN surgery  D andC  Cataract extraction mitral valve repair  2008  Family History: Reviewed history from 09/11/2006 and no changes required. Family History of Stroke F 1st degree relative <60  Social History: Reviewed history from 09/11/2006 and no  changes required. Married Former Smoker  Review of Systems  The patient denies anorexia, fever, weight loss, weight gain, vision loss, decreased hearing, hoarseness, chest pain, syncope, dyspnea on exertion, peripheral edema, prolonged cough, headaches, hemoptysis, abdominal pain, melena, hematochezia, severe indigestion/heartburn, hematuria, incontinence, genital sores, muscle weakness, suspicious skin lesions, transient blindness, difficulty walking, depression, unusual weight change, abnormal bleeding, enlarged lymph nodes, angioedema, and breast masses.    Physical Exam  General:  alert and pale.   Head:  normocephalic and atraumatic.   Eyes:  pupils equal and pupils round.   Ears:  R ear normal and L ear normal.   Nose:  no external deformity and no nasal discharge.   Mouth:  good dentition and pharynx pink and moist.  Neck:  supple.   Lungs:  normal respiratory effort and no wheezes.   Heart:  normal rate, regular rhythm, and no murmur!!!!! Abdomen:  soft and non-tender.   Msk:  decreased ROM, joint tenderness, joint swelling, and redness over joint.   Pulses:  R and L carotid,radial,femoral,dorsalis pedis and posterior tibial pulses are full and equal bilaterally Extremities:  No clubbing, cyanosis, edema, or deformity noted with normal full range of motion of all joints.   Neurologic:  alert & oriented X3 and finger-to-nose normal.     Impression & Recommendations:  Problem # 1:  OSTEOARTHRITIS, HANDS, BILATERAL (ICD-715.94) aspirin 350 two by mouth two times a day  Her updated medication list for this problem includes:    Ultram 50 Mg Tabs (Tramadol hcl) ..... One by mouth q 6 hours as needed pain  Discussed use of medications, application of heat or cold, and exercises.   Problem # 2:  HYPOTHYROIDISM (ICD-244.9)  Her updated medication list for this problem includes:    Synthroid 100 Mcg Tabs (Levothyroxine sodium) .Marland Kitchen... Take 1 tablet by mouth once a day  Labs  Reviewed: TSH: 0.14 (12/26/2008)   Free T4: 1.2 (08/29/2008)    Chol: 252 (12/26/2008)   HDL: 82.40 (12/26/2008)   TG: 97.0 (12/26/2008)  Orders: Venipuncture (24268) TLB-TSH (Thyroid Stimulating Hormone) (84443-TSH) TLB-T4 (Thyrox), Free [phone]) TLB-T3, Free (Triiodothyronine) (84481-T3FREE)  Problem # 3:  MITRAL REGURGITATION (ICD-424.0) post valve surgery Her updated medication list for this problem includes:    Metoprolol Succinate 50 Mg Tb24 (Metoprolol succinate) ..... One in am and 1/2 in pm  Problem # 4:  GASTROESOPHAGEAL REFLUX DISEASE, MILD (ICD-530.81)  Her updated medication list for this problem includes:    Prilosec 20 Mg Cpdr (Omeprazole) ..... One by mouth q od  change with put problems  Complete Medication List: 1)  Synthroid 100 Mcg Tabs (Levothyroxine sodium) .... Take 1 tablet by mouth once a day 2)  Cleocin-t 1 % Lotn (Clindamycin phosphate) .... Apply as directed 3)  Caltrate 600+d Tabs (Calcium carbonate-vitamin d tabs) .... Take 1 tablet by mouth two times a day 4)  Aspirin 325 Mg Tabs (aspirin)  .... Take 2 tablet by mouth two times a day 5)  Triveen-cf Nac 6-2-600 Mg Tabs (Methylfol-methylcob-acetylcyst) .... One p0 daily ( hold for her to calls) 6)  Fish Oil Caps (Omega-3 fatty acids caps) .... Take 1 by mouth two times a day qd 7)  Prilosec 20 Mg Cpdr (Omeprazole) .... One by mouth q od 8)  Metoprolol Succinate 50 Mg Tb24 (Metoprolol succinate) .... One in am and 1/2 in pm 9)  Glucosamine-chondroitin 500-400 Mg Caps (Glucosamine-chondroitin) .... Two times a day 10)  Vitamin D 1000 Unit Caps (Cholecalciferol) .... Once daily 11)  Ultram 50 Mg Tabs (Tramadol hcl) .... One by mouth q 6 hours as needed pain 12)  Muscadine Pus  .... Daily 13)  Vitamin C 500 Mg Tabs (Ascorbic acid) .Marland Kitchen.. 1 tab once daily 14)  Vitamin A  .Marland Kitchen.. 1 tab once daily 15)  Multi V  .... 1 tab once daily 16)  Cyclobenzaprine Hcl 5 Mg Tabs (Cyclobenzaprine hcl) .... One by mouth q  hs  Other Orders: T-Vitamin D (25-Hydroxy) [id number])  Patient Instructions: 1)  If we have a flair of  the eyes Dr Dareen Piano has taken over Dr Renaldo Reel office   OCT CPX

## 2010-04-16 NOTE — Medication Information (Signed)
Summary: rov/jaj  Anticoagulant Therapy  Managed by: Bethena Midget, RN, BSN Referring MD: Juanda Chance PCP: Stacie Glaze MD Supervising MD: Gala Romney MD, Daniel Indication 1: Atrial Fibrillation Lab Used: [facility] [place] Site: [address] INR POC 2.9 INR RANGE 2.0-3.0  Dietary changes: no    Health status changes: yes       Details: having intermittent diarrhea  Bleeding/hemorrhagic complications: no    Recent/future hospitalizations: no    Any changes in medication regimen? no    Recent/future dental: yes     Details: routine cleaning  Any missed doses?: no       Is patient compliant with meds? yes       Allergies: 1)  ! Vioxx 2)  ! * Voltaren Eye Gtts. and Gel  Anticoagulation Management History:      The patient is taking warfarin and comes in today for a routine follow up visit.  Positive risk factors for bleeding include an age of 75 years or older.  The bleeding index is 'intermediate risk'.  Positive CHADS2 values include Age > 75 years old.  Her last INR was 2.8.  Anticoagulation responsible Jocelyn Nold: Bensimhon MD, Reuel Boom.  INR POC: 2.9.  Cuvette Lot#: [id number].  Exp: 01/2011.    Anticoagulation Management Assessment/Plan:      The patient's current anticoagulation dose is Coumadin 5 mg tabs: 1 once daily AS DIRECTED.  The target INR is 2.0-3.0.  The next INR is due 12/24/2009.  Anticoagulation instructions were given to patient.  Results were reviewed/authorized by Bethena Midget, RN, BSN.  She was notified by Ilean Skill D candidate.         Prior Anticoagulation Instructions: INR 4.5  Do not take any Coumadin tomorrow (Tuesday) or Wednesday. Then change dose to 1 tablet everyday except take 1/2 tablet on Tuesdays and Saturdays. Re-check INR in 1 week.   Current Anticoagulation Instructions: INR 2.9  Changed dose to 1/2 tablet on Thursday. Pt will now take 1 tablet everyday except 1/2 tablet on Tuesday, Thursday, and Saturday.

## 2010-04-16 NOTE — Medication Information (Signed)
Summary: rov/cs  Anticoagulant Therapy  Managed by: Inactive Referring MD: Juanda Chance PCP: Stacie Glaze MD Supervising MD: Johney Frame MD, Fayrene Fearing Indication 1: Atrial Fibrillation Lab Used: [facility] [place] Site: [address] INR POC 2.6 INR RANGE 2.0-3.0  Dietary changes: no    Health status changes: no    Bleeding/hemorrhagic complications: no    Recent/future hospitalizations: no    Any changes in medication regimen? yes       Details: Took Amoxicillin 4 tabs prior to dental appt  Recent/future dental: no  Any missed doses?: no       Is patient compliant with meds? yes       Allergies: 1)  ! Vioxx 2)  ! * Voltaren Eye Gtts. and Gel  Anticoagulation Management History:      The patient is taking warfarin and comes in today for a routine follow up visit.  Positive risk factors for bleeding include an age of 75 years or older.  The bleeding index is 'intermediate risk'.  Positive CHADS2 values include Age > 75 years old.  Her last INR was 2.8.  Anticoagulation responsible provider: Chin Wachter MD, Fayrene Fearing.  INR POC: 2.6.  Cuvette Lot#: [id number].  Exp: 01/2011.    Anticoagulation Management Assessment/Plan:      The patient's current anticoagulation dose is Coumadin 5 mg tabs: 1 once daily m,w,f,su--1/2 tue,thur, and sat.  The target INR is 2.0-3.0.  The next INR is due 02/04/2010.  Anticoagulation instructions were given to patient.  Results were reviewed/authorized by Inactive.  She was notified by Bethena Midget, RN, BSN.         Prior Anticoagulation Instructions: INR 2.4  Continue Coumadin as scheduled:  1 tablet every day of the week, except 1/2 tablet on Tuesday, Thursday, and Saturday.  Return to clinic in 2 weeks.    Current Anticoagulation Instructions: INR 2.6 Continue 5mg s daily except 2.5mg s on Tuesdays, Thursdays and Saturdays. Recheck in 4 weeks at [facility] office- Dr Lovell Sheehan at 215pm

## 2010-04-16 NOTE — Miscellaneous (Signed)
  Clinical Lists Changes  Medications: Added new medication of COUMADIN 5 MG TABS (WARFARIN SODIUM) 1 once daily AS DIRECTED - Signed Rx of COUMADIN 5 MG TABS (WARFARIN SODIUM) 1 once daily AS DIRECTED;  #30 x 2;  Signed;  Entered by: Scherrie Bateman, LPN;  Authorized by: Lenoria Farrier, MD, [facility];  Method used: Electronically to [facility]*, [address] [place], [place], [place]  [address], Ph: [phone], Fax: [phone]    Prescriptions: COUMADIN 5 MG TABS (WARFARIN SODIUM) 1 once daily AS DIRECTED  #30 x 2   Entered by:   Scherrie Bateman, LPN   Authorized by:   Lenoria Farrier, MD, [facility]   Signed by:   Scherrie Bateman, LPN on 13/10/6576   Method used:   Electronically to        [facility]* (retail)       [address] [place], [place]  [address]       Ph: [phone]       Fax: [phone]   RxID:   [id number]

## 2010-04-16 NOTE — Assessment & Plan Note (Signed)
Summary: rov per pt call/post echo/lg   Visit Type:  Follow-up Referring Provider:  Peri Jefferson Primary Provider:  Stacie Glaze MD  CC:  no complaints.  History of Present Illness: The patient is 77 years old and returns for management of valvular heart disease and atrial fibrillation. In 2008 she had mitral valve repair at [facility] for mitral regurgitation. Postoperatively she had atrial fibrillation which resolved. She did have one history of atrial fibrillation in the remote past prior to this. She did well after that. On September 25, 1999 she had an episode of atrial fibrillation with a fast heart rate which was asymptomatic and which was noted in the GI clinic. She went to the emergency department and converted with diltiazem and was sent home on diltiazem in addition to her metoprolol. I saw her back in followup and she had no recurrence at that time. We ordered an echocardiogram prior to her visit today. She has had no recurrences since her last visit as far as she can tell although she was asymptomatic with her last recurrence.  We did not put her on Coumadin since he was 12 one. She also drank caffeine prior to this episode which may have been a contributing factor. Her echocardiogram prior to this visit showed an ejection fraction of 55-60%. There was mild to moderate MR which hadn't changed from her last echocardiogram.  She also has collagenous colitis which is managed by Dr. Russella Dar.  She met Dr. Eden Emms during her recent emergency room visit him I can very much. We will arrange followup with Dr. Eden Emms in 6 months.  Current Medications (verified): 1)  Synthroid 100 Mcg  Tabs (Levothyroxine Sodium) .... Take 1 Tablet By Mouth Once A Day 2)  Cleocin-T 1 % Lotn (Clindamycin Phosphate) .... Apply As Directed 3)  Caltrate 600+d   Tabs (Calcium Carbonate-Vitamin D Tabs) .... Take 1 Tablet By Mouth Two Times A Day 4)  Neurpath-B 3-35-2 Mg Tabs (L-Methylfolate-B6-B12) .Marland Kitchen.. 1 Once  Daily 5)  Fish Oil   Caps (Omega-3 Fatty Acids Caps) .... Take 1 By Mouth Two Times A Day Qd 6)  Prilosec 20 Mg  Cpdr (Omeprazole) .... One By Mouth Q Od 7)  Metoprolol Succinate 50 Mg Xr24h-Tab (Metoprolol Succinate) .... Take 1 Tablet  Twice A Day 8)  Glucosamine-Chondroitin 500-400 Mg  Caps (Glucosamine-Chondroitin) .... Two Times A Day 9)  Vitamin C 500 Mg  Tabs (Ascorbic Acid) .Marland Kitchen.. 1 Tab Once Daily 10)  Vitamin A .Marland Kitchen.. 1 Tab Once Daily 11)  Multi V .... 1 Tab Once Daily 12)  Cyclobenzaprine Hcl 5 Mg Tabs (Cyclobenzaprine Hcl) .... One By Mouth Q Hs 13)  Vitamin D 2000 Unit Tabs (Cholecalciferol) .... Once Daily 14)  Etodolac 400 Mg Tabs (Etodolac) .Marland Kitchen.. 1 By Mouth Two Times A Day 15)  Diltiazem Hcl Er Beads 120 Mg Xr24h-Cap (Diltiazem Hcl Er Beads) .... Take One Capsule By Mouth Daily 16)  Pa Resveratrol 250 Mg Caps (Resveratrol) .Marland Kitchen.. 1 By Mouth Once Daily 17)  Aspirin 81 Mg Tbec (Aspirin) .... Take One Tablet By Mouth Daily  Allergies (verified): 1)  ! Vioxx 2)  ! * Voltaren Eye Gtts. and Gel  Past History:  Past Medical History: Reviewed history from 10/22/2009 and no changes required. Atrial fibrillation Osteoarthritis HRT-ovarian MVP Hypothyroidism Osteoporosis 1. Myxomatous degeneration of the mitral valve, status post mitral     valve repair with a good result and now stable. 2. Postoperative atrial fibrillation with no evidence of recurrence.  Hemorrhoids Adenomatous Colon Polyps 04/1986 Collagenous colitis  Review of Systems       ROS is negative except as outlined in HPI.   Vital Signs:  Patient profile:   77 year old female Height:      62 inches Weight:      115 pounds BMI:     21.11 Pulse rate:   92 / minute BP sitting:   126 / 75  (left arm) Cuff size:   regular  Vitals Entered By: Burnett Kanaris, CNA (November 26, 2009 3:19 PM)  Physical Exam  Additional Exam:  Gen. Well-nourished, in no distress   Neck: No JVD, thyroid not enlarged, no carotid  bruits Lungs: No tachypnea, clear without rales, rhonchi or wheezes Cardiovascular: Rhythm regular, PMI not displaced,  heart sounds  normal, no murmurs or gallops, no peripheral edema, pulses normal in all 4 extremities. Abdomen: BS normal, abdomen soft and non-tender without masses or organomegaly, no hepatosplenomegaly. MS: No deformities, no cyanosis or clubbing   Neuro:  No focal sns   Skin:  no lesions    Impression & Recommendations:  Problem # 1:  MITRAL REGURGITATION (ICD-424.0)  She is status post mitral valve repair for mitral regurgitation. A recent echocardiogram shows mild to moderate regurgitation which hasn't changed from her previous echocardiograms. I could not hear a murmur today. I was concerned that the atrial fibrillation might be an indication of some worsening problem with her bowel but this does not appear to be the case. Her updated medication list for this problem includes:    Metoprolol Succinate 100 Mg Xr24h-tab (Metoprolol succinate) .Marland Kitchen... 1 1/2 tab qd  Her updated medication list for this problem includes:    Metoprolol Succinate 100 Mg Xr24h-tab (Metoprolol succinate) .Marland Kitchen... 1 1/2 tab qd  Problem # 2:  Hx of ATRIAL FIBRILLATION (ICD-427.31) She has had paroxysmal fibrillation. As far as we know she hasn't had any recurrence although she was asymptomatic with her previous episode. She is currently on diltiazem and metoprolol for this. Her resting heart rate is fast and I think we'll try to simplify her medications by increasing her metoprolol to control both her resting heart rate and has prophylactics against recurrent atrial fibrillation with fast heart rate. We will DC her diltiazem as we increase her metoprolol.  She is CHAD2 score = 1 and [place] Vasc score = 3 which means risks of stroke is about 3% per year. I did not discuss this on her visit today but I think with this wrist she may be better with Coumadin. She will call and discuss this with her. I will  check with Dr. Russella Dar to see if her colitis puts her at an increased risk of bleeding.  Her updated medication list for this problem includes:    Metoprolol Succinate 50 Mg Xr24h-tab (Metoprolol succinate) .Marland Kitchen... Take 1 tablet  twice a day    Aspirin 81 Mg Tbec (Aspirin) .Marland Kitchen... Take one tablet by mouth daily  Patient Instructions: 1)  Your physician recommends that you schedule a follow-up appointment in: 6 MONTHS WITH DR Eden Emms 2)  Your physician has recommended you make the following change in your medication:  3)  STOP DILTIAZEM 4)  INCREASE METOPROLOL TO 150 MG once daily Prescriptions: METOPROLOL SUCCINATE 100 MG XR24H-TAB (METOPROLOL SUCCINATE) 1 1/2 TAB QD  #135 x 4   Entered by:   Scherrie Bateman, LPN   Authorized by:   Lenoria Farrier, MD, [facility]   Signed by:   Scherrie Bateman,  LPN on 56/21/3086   Method used:   Faxed to ...       [facility])             , [place]         Ph:        Fax: [phone]   RxID:   [id number]   Appended Document: rov per pt call/post echo/lg Based on her [place] Vasc score I think it would be best to treat her with Coumadin. I discussed this with her by phone and her she is agreeable. We will start her on 5 mg of Coumadin daily and have her seen in our Coumadin clinic about 5 days later. I told her there may be other options as I Coumadin and we can consider in the future.

## 2010-04-16 NOTE — Procedures (Signed)
Summary: Soil scientist   Imported By: Sherian Rein 10/22/2009 14:13:02  _____________________________________________________________________  External Attachment:    Type:   Image     Comment:   External Document

## 2010-04-16 NOTE — Progress Notes (Signed)
Summary: lab results    Appended Document: lab results Called pt and advised her that Per Dr Russella Dar and Willette Cluster NP we should order a Colonoscopy. Scheduled the Colonoscopy for 7-14 11. Previsit scheduled for Fri 09-21-09 at 10Am.   Gave order to Dwan Bolt to [employer].

## 2010-04-16 NOTE — Progress Notes (Signed)
Summary: stools loose & watery since April  Phone Note Call from Patient Call back at [facility] Phone [phone]   Summary of Call: Still same problem with bowels, loose stools about 3 during night large amounts fluid very fragmented matter & disintegrates & several small times am. Normal discomfort before bms.   Eating as normal cereal & fruit, sandwich lunch, vegetables & meat supper.  Metronizadole taken, medicines stopped levothyroxine 2d & 1/2 day, back to 1 day now, colestipol no difference & stopped.  Urinating & don't feel bad.  Doesn't drink juice & only milk is in cereal.  Last colon within last couple years normal, Dr. Russella Dar.  Wonders about vit & supp causing.  Evening primorse for thin hair.  Just found on internet that it can cause loose stools.  Will stop it.  Others taking long time.  This started April 6 & no better since.  It doesn't bother her that much days.  [facility] for short term.  [facility] others.  NKDA.  Initial call taken by: Rudy Jew, RN,  August 27, 2009 9:43 AM  Follow-up for Phone Call        agree with holding vitalmin and monitering referral to dr stark iof this does not help repeat stool sudies ( complete)  again Follow-up by: Stacie Glaze MD,  August 27, 2009 2:08 PM  Additional Follow-up for Phone Call Additional follow up Details #1::        Phone Call Completed Additional Follow-up by: Rudy Jew, RN,  August 27, 2009 2:18 PM

## 2010-04-16 NOTE — Procedures (Signed)
Summary: Colonoscopy  Patient: Theresa [name] Note: All result statuses are Final unless otherwise noted.  Tests: (1) Colonoscopy (COL)   COL Colonoscopy           DONE     [facility]     [address]     [place], [place]  [address]           COLONOSCOPY PROCEDURE REPORT     PATIENT:  Theresa [name]  MR#:  [id number]     BIRTHDATE:  10/23/1931, 78 yrs. old  GENDER:  female     ENDOSCOPIST:  Judie Petit T. Russella Dar, MD, [facility]           PROCEDURE DATE:  09/27/2009     PROCEDURE:  Colonoscopy with biopsy     ASA CLASS:  Class II     INDICATIONS:  1) unexplained diarrhea  2) follow-up of polyp  3)     family history of colon cancer adenomatous polyp,1998. Father had     colon cancer.     MEDICATIONS:   Fentanyl 75 mcg IV, Versed 6 mg IV     DESCRIPTION OF PROCEDURE:   After the risks benefits and     alternatives of the procedure were thoroughly explained, informed     consent was obtained.  Digital rectal exam was performed and     revealed no abnormalities.   The LB [id number]-[id number] endoscope     was introduced through the anus and advanced to the terminal ileum     which was intubated for a short distance, without limitations.     The quality of the prep was good, using MoviPrep.  The instrument     was then slowly withdrawn as the colon was fully examined.     <<PROCEDUREIMAGES>>     FINDINGS:  A sessile polyp was found in the sigmoid colon. It was     4 mm in size. The polyp was removed using cold biopsy forceps.  A     normal appearing cecum, ileocecal valve, and appendiceal orifice     were identified. The ascending, hepatic flexure, transverse,     splenic flexure, descending  colon, and rectum appeared     unremarkable. Random biopsies were obtained and sent to pathology.     The terminal ileum appeared normal. Retroflexed views in the     rectum revealed no abnormalities.  The time to cecum =  1.67     minutes. The scope was then withdrawn (time =  8  min) from the     patient and the procedure completed.           COMPLICATIONS:  None           ENDOSCOPIC IMPRESSION:     1) 4 mm sessile polyp in the sigmoid colon     2) Normal terminal ileum     RECOMMENDATIONS:     1) Await pathology results     2) Repeat Colonoscopy in 5 years pending pathology review     3) call office next 1-3 days to schedule followup visit in 2-3     weeks     4) Imodium 1-2 po hs prn           Mindi Akerson T. Russella Dar, MD, [facility]           CC: Stacie Glaze, MD           n.     Rosalie DoctorVenita Lick. Russella Dar  at 09/27/2009 03:44 PM           Marin Comment, [phone]  Note: An exclamation mark (!) indicates a result that was not dispersed into the flowsheet. Document Creation Date: 09/27/2009 3:45 PM _______________________________________________________________________  (1) Order result status: Final Collection or observation date-time: 09/27/2009 15:39 Requested date-time:  Receipt date-time:  Reported date-time:  Referring Physician:   Ordering Physician: Claudette Head [phone]) Specimen Source:  Source: Launa Grill Order Number: [id number] Lab site:   Appended Document: Colonoscopy     Procedures Next Due Date:    Colonoscopy: 09/2014

## 2010-04-16 NOTE — Assessment & Plan Note (Signed)
Summary: diarrhea/sheri   History of Present Illness Visit Type: Follow-up Visit Primary GI MD: Elie Goody MD [facility] Primary Provider: Stacie Glaze MD Requesting Provider: Peri Jefferson Chief Complaint: Diarrhea has been going on since April, was up three times in the night last night. History of Present Illness:   Patient followed by Dr. Russella Dar for history of colon polyps. Her last colonoscopy was Aug. 2010. She is here for evaluation of diarrhea which started April 6th. Prior to that BMs were normal at 1-2 times a day. Her diarrhea is nocturnal  and in the mornings. Usually has 3 episodes during the night and up to six, low volume stools in the morning. She has associated urgency but no cramps. No nausea. No rectal bleeding. Recent 4-5 pound weight loss. Feels fine during the day. No recent medication or dietary changes. Tried Citrucel, it helped but not significantly. PCP gave patient 10 days course of Flagyl but that didn't help. Patient stopped ASA and Lodine for several days but didn't make a difference. Stool studies including C-Diff x1, Culture, O&P, giardia, and cryptosporidium were negative. Stool lactoferrin positive.    GI Review of Systems    Reports abdominal pain.      Denies acid reflux, belching, bloating, chest pain, dysphagia with liquids, dysphagia with solids, heartburn, loss of appetite, nausea, vomiting, vomiting blood, weight loss, and  weight gain.      Reports change in bowel habits and  diarrhea.     Denies anal fissure, black tarry stools, constipation, diverticulosis, fecal incontinence, heme positive stool, hemorrhoids, irritable bowel syndrome, jaundice, light color stool, liver problems, rectal bleeding, and  rectal pain. Preventive Screening-Counseling & Management      Drug Use:  no.      Current Medications (verified): 1)  Synthroid 100 Mcg  Tabs (Levothyroxine Sodium) .... Take 1 Tablet By Mouth Once A Day 2)  Cleocin-T 1 % Lotn (Clindamycin  Phosphate) .... Apply As Directed 3)  Caltrate 600+d   Tabs (Calcium Carbonate-Vitamin D Tabs) .... Take 1 Tablet By Mouth Two Times A Day 4)  Triveen-Cf Nac 6-2-600 Mg Tabs (Methylfol-Methylcob-Acetylcyst) .... One P0 Daily ( Hold For Her To Calls) 5)  Fish Oil   Caps (Omega-3 Fatty Acids Caps) .... Take 1 By Mouth Two Times A Day Qd 6)  Prilosec 20 Mg  Cpdr (Omeprazole) .... One By Mouth Q Od 7)  Metoprolol Succinate 50 Mg  Tb24 (Metoprolol Succinate) .... One in Am and 1/2 in Pm 8)  Glucosamine-Chondroitin 500-400 Mg  Caps (Glucosamine-Chondroitin) .... Two Times A Day 9)  Vitamin D 1000 Unit  Caps (Cholecalciferol) .... Once Daily 10)  [place] Pus .... Daily 11)  Vitamin C 500 Mg  Tabs (Ascorbic Acid) .Marland Kitchen.. 1 Tab Once Daily 12)  Vitamin A .Marland Kitchen.. 1 Tab Once Daily 13)  Multi V .... 1 Tab Once Daily 14)  Cyclobenzaprine Hcl 5 Mg Tabs (Cyclobenzaprine Hcl) .... One By Mouth Q Hs 15)  Vitamin D 2000 Unit Tabs (Cholecalciferol) .... Once Daily 16)  Etodolac 400 Mg Tabs (Etodolac) .Marland Kitchen.. 1 By Mouth Two Times A Day  Allergies (verified): 1)  ! Vioxx 2)  ! * Voltaren Eye Gtts. and Gel  Past History:  Past Medical History: Reviewed history from 11/08/2008 and no changes required. Atrial fibrillation Osteoarthritis HRT-ovarian MVP Hypothyroidism Osteoporosis  1. Myxomatous degeneration of the mitral valve, status post mitral     valve repair with a good result and now stable. 2. Postoperative atrial fibrillation with  no evidence of recurrence.   Past Surgical History: Reviewed history from 05/31/2008 and no changes required. Cholecystectomy Lumbar fusion Tonsillectomy GYN surgery  D andC  Cataract extraction mitral valve repair  2008  Family History: Family History of Stroke F 1st degree relative <60 Family History of Colon Cancer: Father had colon surgery for cancer  Social History: Married Former Smoker Alcohol Use - yes wine daily Illicit Drug Use - no Occupation:   retired Drug Use:  no  Review of Systems       The patient complains of arthritis/joint pain and sleeping problems.    Vital Signs:  Patient profile:   77 year old female Height:      62 inches Weight:      114 pounds BMI:     20.93 BSA:     1.51 Pulse rate:   128 / minute Pulse rhythm:   regular BP sitting:   132 / 84  (right arm)  Vitals Entered By: Merri Ray CMA Duncan Dull) (September 05, 2009 1:32 PM)  Physical Exam  General:  Well developed, well nourished, no acute distress. Head:  Normocephalic and atraumatic. Eyes:  Conjunctiva pink, no icterus.  Mouth:  No oral lesions. Tongue moist.  Neck:  no obvious masses  Lungs:  Clear throughout to auscultation. Heart:  Regular rate and rhythm; no murmurs, rubs,  or bruits. Abdomen:  Abdomen soft, nontender, nondistended. No obvious masses or hepatomegaly.Normal bowel sounds.  Msk:  Symmetrical with no gross deformities. Normal posture. Extremities:  No palmar erythema, no edema.  Neurologic:  Alert and  oriented x4;  grossly normal neurologically. Skin:  Intact without significant lesions or rashes. Cervical Nodes:  No significant cervical adenopathy. Psych:  Alert and cooperative. Normal mood and affect.   Impression & Recommendations:  Problem # 1:  DIARRHEA (ICD-787.91) Assessment New Over two month history of nocturnal and morningtime diarrhea with mild weight loss. Stool studies negative except for lactoferrin. No response to Flagyl. In March her TSH and Free T4 were abnormal suggesting over replacement. Apparently Synthroid held for few days at the time. Rule out hyperthyroidism especially given tachycardia. Rule out celiac. If above negative she may need flexible sigmoidoscopy with random biopsies.   Problem # 2:  TACHYCARDIA (ICD-785.0) Assessment: Deteriorated Heart rate 128-148 in office now. No dizziness, chest pain or SOB-looks okay. No signs of dehydration (not orthostatic, mucous membranes moist. I called  her cardiologist Dr. Eden Emms, we did an EKG at his request and patient found to be in rapid atrial flutter. Dr. Eden Emms advised patient go to ER for rate control and admission by [facility]. Patient was sent to ER. We will arrange for our labs to be done while in the hospital or patient can come back to our lab when acute issues resolve. Patient's husband in waiting room and will take her to ER now.  Orthostatics: sitting pulse 100    and BP   130/68 laying pulse 120    and BP   132/98 Standing pulse 120 and BP 132/100

## 2010-04-16 NOTE — Progress Notes (Signed)
Summary: next step  Phone Note Call from Patient Call back at Home Phone [phone]   Caller: Patient Call For: Dr. Russella Dar Reason for Call: Talk to Nurse Summary of Call: would like to know "where to go from here" after having labs, etc. Initial call taken by: Vallarie Mare,  September 10, 2009 3:29 PM  Follow-up for Phone Call        Patient  wants to know the next step.  Willette Cluster RNP I have pasted the TSH and T4 into her chart please review and advise next step.  She wants to know what the next step is. Follow-up by: Darcey Leslie RN, CGRN,  September 10, 2009 3:57 PM  Additional Follow-up for Phone Call Additional follow up Details #1::        Thyroid okay. Please have her come for celiac panel and if negative then I need to talk with her over telephone about next step. Thanks Additional Follow-up by: Willette Cluster NP,  September 11, 2009 9:17 AM    Additional Follow-up for Phone Call Additional follow up Details #2::    Patient  aware, she will come today for celiac panel.  Follow-up by: Darcey Malya RN, CGRN,  September 11, 2009 9:24 AM

## 2010-04-16 NOTE — Procedures (Signed)
Summary: Colonoscopy/[place]  Colonoscopy/   Imported By: Sherian Rein 10/22/2009 14:11:46  _____________________________________________________________________  External Attachment:    Type:   Image     Comment:   External Document

## 2010-04-16 NOTE — Progress Notes (Signed)
Summary: diarrhea x 3-4 weeks  Phone Note Call from Patient   Caller: Patient Call For: Stacie Glaze MD Reason for Call: Acute Illness, Privacy/Consent Authorization Summary of Call: Pt is still having diarrhea x 3- 4 weeks.  Appetite is fine.  After taking Flagyl, episodes got fewer but still having 4-6 episodes of diarrhea daily. Pt is off ASA and Lodine.  Needs RX for arthritis pain.  [employer]. [phone] Initial call taken by: Lynann Beaver CMA,  Jul 17, 2009 10:49 AM  Follow-up for Phone Call        stool studies-ova and parasite, c&s,c-diff,giardia, and tthen questran 1 scoop two times a day- may have tramadol 1 every 4-6 hours as needed pain.   #30 per dr Lovell Sheehan Follow-up by: Willy Eddy, LPN,  Jul 17, 1912 3:59 PM    New/Updated Medications: TRAMADOL HCL 50 MG TABS (TRAMADOL HCL) one by mouth q 4-6 hrs as needed as needed pain Prescriptions: TRAMADOL HCL 50 MG TABS (TRAMADOL HCL) one by mouth q 4-6 hrs as needed as needed pain  #30 x 0   Entered by:   Lynann Beaver CMA   Authorized by:   Stacie Glaze MD   Signed by:   Lynann Beaver CMA on 07/17/2009   Method used:   Electronically to        [facility]* (retail)       [address] [place], [place]  [address]       Ph: [phone]       Fax: [phone]   RxID:   [id number]  Pt. notified.

## 2010-04-16 NOTE — Assessment & Plan Note (Signed)
Summary: follow up colon/sheri   History of Present Illness Visit Type: Follow-up Visit Primary GI MD: Elie Goody MD [facility] Primary Kavi Almquist: Stacie Glaze MD Requesting Mahamed Zalewski: Peri Jefferson Chief Complaint: follow-up colonoscopy History of Present Illness:   Theresa [name], returns today with complete resolution of her diarrhea after starting Entocort. Collagenous colitis was found on biopsies at colonoscopy. She states that after 2-3 days on Entocort her diarrhea completely abated and she has no gastrointestinal complaints since that time.   GI Review of Systems      Denies abdominal pain, acid reflux, belching, bloating, chest pain, dysphagia with liquids, dysphagia with solids, heartburn, loss of appetite, nausea, vomiting, vomiting blood, weight loss, and  weight gain.        Denies anal fissure, black tarry stools, change in bowel habit, constipation, diarrhea, diverticulosis, fecal incontinence, heme positive stool, hemorrhoids, irritable bowel syndrome, jaundice, light color stool, liver problems, rectal bleeding, and  rectal pain.   Current Medications (verified): 1)  Synthroid 100 Mcg  Tabs (Levothyroxine Sodium) .... Take 1 Tablet By Mouth Once A Day 2)  Cleocin-T 1 % Lotn (Clindamycin Phosphate) .... Apply As Directed 3)  Caltrate 600+d   Tabs (Calcium Carbonate-Vitamin D Tabs) .... Take 1 Tablet By Mouth Two Times A Day 4)  Triveen-Cf Nac 6-2-600 Mg Tabs (Methylfol-Methylcob-Acetylcyst) .... One P0 Daily ( Hold For Her To Calls) 5)  Fish Oil   Caps (Omega-3 Fatty Acids Caps) .... Take 1 By Mouth Two Times A Day Qd 6)  Prilosec 20 Mg  Cpdr (Omeprazole) .... One By Mouth Q Od 7)  Metoprolol Succinate 50 Mg Xr24h-Tab (Metoprolol Succinate) .... Take 1 Tablet  Twice A Day 8)  Glucosamine-Chondroitin 500-400 Mg  Caps (Glucosamine-Chondroitin) .... Two Times A Day 9)  Vitamin C 500 Mg  Tabs (Ascorbic Acid) .Marland Kitchen.. 1 Tab Once Daily 10)  Vitamin A .Marland Kitchen.. 1 Tab Once Daily 11)   Multi V .... 1 Tab Once Daily 12)  Cyclobenzaprine Hcl 5 Mg Tabs (Cyclobenzaprine Hcl) .... One By Mouth Q Hs 13)  Vitamin D 2000 Unit Tabs (Cholecalciferol) .... Once Daily 14)  Etodolac 400 Mg Tabs (Etodolac) .Marland Kitchen.. 1 By Mouth Two Times A Day 15)  Diltiazem Hcl Er Beads 120 Mg Xr24h-Cap (Diltiazem Hcl Er Beads) .... Take One Capsule By Mouth Daily 16)  Entocort Ec 3 Mg  Cp24 (Budesonide) .... Take 3 Capsules Daily 17)  Pa Resveratrol 250 Mg Caps (Resveratrol) .Marland Kitchen.. 1 By Mouth Once Daily  Allergies (verified): 1)  ! Vioxx 2)  ! * Voltaren Eye Gtts. and Gel  Past History:  Past Medical History: Atrial fibrillation Osteoarthritis HRT-ovarian MVP Hypothyroidism Osteoporosis 1. Myxomatous degeneration of the mitral valve, status post mitral     valve repair with a good result and now stable. 2. Postoperative atrial fibrillation with no evidence of recurrence.  Hemorrhoids Adenomatous Colon Polyps 04/1986 Collagenous colitis  Past Surgical History: Reviewed history from 05/31/2008 and no changes required. Cholecystectomy Lumbar fusion Tonsillectomy GYN surgery  D andC  Cataract extraction mitral valve repair  2008  Family History: Reviewed history from 09/05/2009 and no changes required. Family History of Stroke F 1st degree relative <60 Family History of Colon Cancer: Father had colon surgery for cancer  Social History: Reviewed history from 09/05/2009 and no changes required. Married Former Smoker Alcohol Use - yes wine daily Illicit Drug Use - no Occupation:  retired  Review of Systems       The pertinent positives and negatives  are noted as above and in the HPI. All other ROS were reviewed and were negative.   Vital Signs:  Patient profile:   77 year old female Height:      62 inches Weight:      112 pounds BMI:     20.56 Pulse rate:   92 / minute Pulse rhythm:   regular BP sitting:   128 / 78  (left arm)  Vitals Entered By: Milford Cage NCMA (October 22, 2009 10:17 AM)  Physical Exam  General:  Well developed, well nourished, no acute distress. Head:  Normocephalic and atraumatic. Eyes:  PERRLA, no icterus. Mouth:  No deformity or lesions, dentition normal. Lungs:  Clear throughout to auscultation. Heart:  Regular rate and rhythm; no murmurs, rubs,  or bruits. Abdomen:  Soft, nontender and nondistended. No masses, hepatosplenomegaly or hernias noted. Normal bowel sounds. Psych:  Alert and cooperative. Normal mood and affect.  Impression & Recommendations:  Problem # 1:  OTH&UNSPEC NONINFECTIOUS GASTROENTERITIS&COLITIS (ICD-558.9) Collagenous colitis, which has completely responded to Entocort. Plan for Entocort taper and discontinue as outlined. If her symptoms recur while she was traveling in [place], she will resume Entocort 9 mg daily until she returns to [place]. She has another flare in the future, consider the use of a 5-ASA agent.  Problem # 2:  GASTROESOPHAGEAL REFLUX DISEASE, MILD (ICD-530.81) Continue Prilosec 20 mg q. day.  Problem # 3:  PERSONAL HX COLONIC POLYPS (ICD-V12.72) Prior history of adenomatous colon polyps. Surveillance colonoscopy in 09/2014.  Patient Instructions: 1)  Decrease your  Entocort to 2 capsules by mouth every morning x 1 week, then reduce to 1 capsule by mouth every morning x 1 week, then reduce to 1 capsule by mouth every other day x 1 week then discontinue.  2)  Please schedule a follow-up appointment as needed.  3)  Copy sent to : Darryll Capers, MD 4)  The medication list was reviewed and reconciled.  All changed / newly prescribed medications were explained.  A complete medication list was provided to the patient / caregiver.

## 2010-04-16 NOTE — Assessment & Plan Note (Signed)
Summary: Theresa [name]   Visit Type:  Follow-up Referring Provider:  Peri Jefferson Primary Provider:  Stacie Glaze MD  CC:  no complaints.  History of Present Illness: The patient is 77 years old and came in today for an unscheduled visit because of recent atrial fibrillation. She was in the GI office being evaluated for symptoms of diarrhea and was found to be in rapid atrial fibrillation. She was not aware of this. She was sent for emergent with a long and converted. She was sent home on diltiazem by Dr. Eden Emms. She has had no symptomatic recurrences since that time.  she had mitral valve repair at [facility] in 2008 for severe mitral regurgitation. She did have paroxysmal atrial fibrillation postoperatively and was on amiodarone for a short period of time. She also has a remote history of atrial fibrillation. She says she had a cup of caffeinated coffee, which was unusual for her, prior to her recent episode.  Her last echocardiogram performed in August of 2010 did show some moderate mitral regurgitation into a slightly different than her previous echoes.  Current Medications (verified): 1)  Synthroid 100 Mcg  Tabs (Levothyroxine Sodium) .... Take 1 Tablet By Mouth Once A Day 2)  Cleocin-T 1 % Lotn (Clindamycin Phosphate) .... Apply As Directed 3)  Caltrate 600+d   Tabs (Calcium Carbonate-Vitamin D Tabs) .... Take 1 Tablet By Mouth Two Times A Day 4)  Triveen-Cf Nac 6-2-600 Mg Tabs (Methylfol-Methylcob-Acetylcyst) .... One P0 Daily ( Hold For Her To Calls) 5)  Fish Oil   Caps (Omega-3 Fatty Acids Caps) .... Take 1 By Mouth Two Times A Day Qd 6)  Prilosec 20 Mg  Cpdr (Omeprazole) .... One By Mouth Q Od 7)  Metoprolol Succinate 50 Mg  Tb24 (Metoprolol Succinate) .... One in Am and 1/2 in Pm 8)  Glucosamine-Chondroitin 500-400 Mg  Caps (Glucosamine-Chondroitin) .... Two Times A Day 9)  Vitamin D 1000 Unit  Caps (Cholecalciferol) .... Once Daily 10)  Vitamin C 500 Mg  Tabs (Ascorbic Acid)  .Marland Kitchen.. 1 Tab Once Daily 11)  Vitamin A .Marland Kitchen.. 1 Tab Once Daily 12)  Multi V .... 1 Tab Once Daily 13)  Cyclobenzaprine Hcl 5 Mg Tabs (Cyclobenzaprine Hcl) .... One By Mouth Q Hs 14)  Vitamin D 2000 Unit Tabs (Cholecalciferol) .... Once Daily 15)  Etodolac 400 Mg Tabs (Etodolac) .Marland Kitchen.. 1 By Mouth Two Times A Day 16)  Diltiazem Hcl Er Beads 120 Mg Xr24h-Cap (Diltiazem Hcl Er Beads) .... Take One Capsule By Mouth Daily 17)  Moviprep 100 Gm  Solr (Peg-Kcl-Nacl-Nasulf-Na Asc-C) .... As Per Prep Instructions.  Allergies (verified): 1)  ! Vioxx 2)  ! * Voltaren Eye Gtts. and Gel  Past History:  Past Medical History: Reviewed history from 11/08/2008 and no changes required. Atrial fibrillation Osteoarthritis HRT-ovarian MVP Hypothyroidism Osteoporosis  1. Myxomatous degeneration of the mitral valve, status post mitral     valve repair with a good result and now stable. 2. Postoperative atrial fibrillation with no evidence of recurrence.   Review of Systems       ROS is negative except as outlined in HPI.   Vital Signs:  Patient profile:   77 year old female Height:      62 inches Weight:      112 pounds BMI:     20.56 Pulse rate:   95 / minute Resp:     16 per minute BP sitting:   124 / 68  (left arm)  Vitals Entered  By: Marrion Coy, CNA (September 24, 2009 4:25 PM)  Physical Exam  Additional Exam:  Gen. Well-nourished, in no distress   Neck: No JVD, thyroid not enlarged, no carotid bruits Lungs: No tachypnea, clear without rales, rhonchi or wheezes Cardiovascular: Rhythm regular, PMI not displaced,  heart sounds  normal, no murmurs or gallops, no peripheral edema, pulses normal in all 4 extremities. Abdomen: BS normal, abdomen soft and non-tender without masses or organomegaly, no hepatosplenomegaly. MS: No deformities, no cyanosis or clubbing   Neuro:  No focal sns   Skin:  no lesions    Impression & Recommendations:  Problem # 1:  Hx of ATRIAL FIBRILLATION (ICD-427.31) She  has had a recent episode of paroxysmal atrial fibrillation. Unfortunately we don't have ECG documentation. It appears that the ECG performed in GI was not scanned into the system and the ECG from [facility] long emergency room was taken after she converted to sinus rhythm. Her resting heart rate is still little bit fast we'll increase her metoprolol from 50 in the morning and 25 an afternoon to 50 twice a day. We will get a 2-D echocardiogram to reevaluate her valve and make sure the age of fibrillation is not an indication of any new valve dysfunction. I do not hear a murmur today.  her TSH was normal.  She is Chand score 1. We do have precipitating causes for both of her episodes of atrial fibrillation. One occurred postoperatively and the other occurred after caffeine which was unusual for her. We will not committed to Coumadin at this point. Her updated medication list for this problem includes:    Metoprolol Succinate 50 Mg Xr24h-tab (Metoprolol succinate) .Marland Kitchen... Take 1 tablet  twice a day  Orders: EKG w/ Interpretation (93000) Echocardiogram (Echo)  Problem # 2:  MITRAL REGURGITATION (ICD-424.0) She is status post mitral valve repair at [facility]. I don't hear a murmur. We will repeat her echocardiogram to reevaluate this in view of her atrial fibrillation. I'll see her back in September. Her updated medication list for this problem includes:    Metoprolol Succinate 50 Mg Xr24h-tab (Metoprolol succinate) .Marland Kitchen... Take 1 tablet  twice a day  Patient Instructions: 1)  Your physician recommends that you schedule a follow-up appointment in: 3 months with Dr. Juanda Chance 2)  Your physician has recommended you make the following change in your medication: Increase metoprolol to 50mg  twice a day. 3)  Avoid Caffeine 4)  Your physician has requested that you have an echocardiogram.  Echocardiography is a painless test that uses sound waves to create images of your heart. It provides your doctor with information about  the size and shape of your heart and how well your heart's chambers and valves are working.  This procedure takes approximately one hour. There are no restrictions for this procedure.

## 2010-04-16 NOTE — Letter (Signed)
Summary: Outpatient Coinsurance Notice  Outpatient Coinsurance Notice   Imported By: Marylou Mccoy 10/24/2009 18:44:00  _____________________________________________________________________  External Attachment:    Type:   Image     Comment:   External Document

## 2010-04-16 NOTE — Assessment & Plan Note (Signed)
Summary: annual visi9t/bmw   Vital Signs:  Patient profile:   77 year old female Height:      62 inches Weight:      162 pounds BMI:     29.74 Temp:     98.2 degrees F oral Pulse rate:   80 / minute Resp:     14 per minute BP sitting:   114 / 70  (left arm)  Vitals Entered By: Willy Eddy, LPN (December 26, 2009 12:31 PM)  Contraindications/Deferment of Procedures/Staging:    Test/Procedure: PAP Smear    Reason for deferment: patient declined  CC: annual visit for disease management Is Patient Diabetic? No   Primary Care Provider:  Stacie Glaze MD  CC:  annual visit for disease management.  History of Present Illness: The pt was asked about all immunizations, health maint. services that are appropriate to their age and was given guidance on diet exercize  and weight management   reviewed recent Diarhea episode with the working diagnosis of calagenous colitis placed on budesonide and the symptoms stopped almost immediately now tapered off felt well on the steroids!!!  High HDL with high total due to other medications complications would not treat with statin at 89   Dr brody placed on coumadin  Preventive Screening-Counseling & Management  Alcohol-Tobacco     Smoking Status: quit     Year Quit: 1962     Tobacco Counseling: not indicated; no tobacco use  Problems Prior to Update: 1)  Personal Hx Colonic Polyps  (ICD-V12.72) 2)  Oth&unspec Noninfectious Gastroenteritis&colitis  (ICD-558.9) 3)  Change in Bowels  (ICD-787.99) 4)  Tachycardia  (ICD-785.0) 5)  Diarrhea  (ICD-787.91) 6)  Diarrhea  (ICD-787.91) 7)  Gastroesophageal Reflux Disease, Mild  (ICD-530.81) 8)  Dermatophytosis of The Body  (ICD-110.5) 9)  Raynaud's Syndrome  (ICD-443.0) 10)  Scoliosis, Lumbar Spine  (ICD-737.30) 11)  Preventive Health Care  (ICD-V70.0) 12)  Vasomotor Rhinitis  (ICD-477.9) 13)  Cellulitis, Hand, Left  (ICD-682.4) 14)  Osteoarthritis, Hands, Bilateral   (ICD-715.94) 15)  Osteoporosis  (ICD-733.00) 16)  Hypothyroidism  (ICD-244.9) 17)  Mitral Regurgitation  (ICD-424.0) 18)  Mitral Valve Prolapse  (ICD-424.0) 19)  Osteoarthritis  (ICD-715.90) 20)  Hx of Atrial Fibrillation  (ICD-427.31)  Current Problems (verified): 1)  Personal Hx Colonic Polyps  (ICD-V12.72) 2)  Oth&unspec Noninfectious Gastroenteritis&colitis  (ICD-558.9) 3)  Change in Bowels  (ICD-787.99) 4)  Tachycardia  (ICD-785.0) 5)  Diarrhea  (ICD-787.91) 6)  Diarrhea  (ICD-787.91) 7)  Gastroesophageal Reflux Disease, Mild  (ICD-530.81) 8)  Dermatophytosis of The Body  (ICD-110.5) 9)  Raynaud's Syndrome  (ICD-443.0) 10)  Scoliosis, Lumbar Spine  (ICD-737.30) 11)  Preventive Health Care  (ICD-V70.0) 12)  Vasomotor Rhinitis  (ICD-477.9) 13)  Cellulitis, Hand, Left  (ICD-682.4) 14)  Osteoarthritis, Hands, Bilateral  (ICD-715.94) 15)  Osteoporosis  (ICD-733.00) 16)  Hypothyroidism  (ICD-244.9) 17)  Mitral Regurgitation  (ICD-424.0) 18)  Mitral Valve Prolapse  (ICD-424.0) 19)  Osteoarthritis  (ICD-715.90) 20)  Hx of Atrial Fibrillation  (ICD-427.31)  Medications Prior to Update: 1)  Synthroid 100 Mcg  Tabs (Levothyroxine Sodium) .... Take 1 Tablet By Mouth Once A Day 2)  Cleocin-T 1 % Lotn (Clindamycin Phosphate) .... Apply As Directed 3)  Caltrate 600+d   Tabs (Calcium Carbonate-Vitamin D Tabs) .... Take 1 Tablet By Mouth Two Times A Day 4)  Neurpath-B 3-35-2 Mg Tabs (L-Methylfolate-B6-B12) .Marland Kitchen.. 1 Once Daily 5)  Fish Oil   Caps (Omega-3 Fatty Acids Caps) .... Take 1  By Mouth Two Times A Day Qd 6)  Prilosec 20 Mg  Cpdr (Omeprazole) .... One By Mouth Q Od 7)  Metoprolol Succinate 100 Mg Xr24h-Tab (Metoprolol Succinate) .Marland Kitchen.. 1 1/2 Tab Qd 8)  Glucosamine-Chondroitin 500-400 Mg  Caps (Glucosamine-Chondroitin) .... Two Times A Day 9)  Vitamin C 500 Mg  Tabs (Ascorbic Acid) .Marland Kitchen.. 1 Tab Once Daily 10)  Vitamin A .Marland Kitchen.. 1 Tab Once Daily 11)  Multi V .... 1 Tab Once Daily 12)   Cyclobenzaprine Hcl 5 Mg Tabs (Cyclobenzaprine Hcl) .... One By Mouth Q Hs 13)  Vitamin D 2000 Unit Tabs (Cholecalciferol) .... Once Daily 14)  Etodolac 400 Mg Tabs (Etodolac) .Marland Kitchen.. 1 By Mouth Two Times A Day 15)  Diltiazem Hcl Er Beads 120 Mg Xr24h-Cap (Diltiazem Hcl Er Beads) .... Take One Capsule By Mouth Daily 16)  Pa Resveratrol 250 Mg Caps (Resveratrol) .Marland Kitchen.. 1 By Mouth Once Daily 17)  Aspirin 81 Mg Tbec (Aspirin) .... Take One Tablet By Mouth Daily 18)  Coumadin 5 Mg Tabs (Warfarin Sodium) .Marland Kitchen.. 1 Once Daily As Directed  Current Medications (verified): 1)  Synthroid 100 Mcg  Tabs (Levothyroxine Sodium) .... Take 1 Tablet By Mouth Once A Day 2)  Cleocin-T 1 % Lotn (Clindamycin Phosphate) .... Apply As Directed 3)  Caltrate 600+d   Tabs (Calcium Carbonate-Vitamin D Tabs) .... Take 1 Tablet By Mouth Two Times A Day 4)  Folbee 2.5-25-1 Mg Tabs (Folic Acid-Vit B6-Vit B12) .... One By Mouth Daily 5)  Fish Oil   Caps (Omega-3 Fatty Acids Caps) .... Take 1 By Mouth Two Times A Day Qd 6)  Prilosec 20 Mg  Cpdr (Omeprazole) .... One By Mouth Q Od 7)  Metoprolol Succinate 100 Mg Xr24h-Tab (Metoprolol Succinate) .Marland Kitchen.. 1 1/2 Tab Qd 8)  Glucosamine-Chondroitin 500-400 Mg  Caps (Glucosamine-Chondroitin) .... Two Times A Day 9)  Vitamin C 500 Mg  Tabs (Ascorbic Acid) .Marland Kitchen.. 1 Tab Once Daily 10)  Vitamin A .Marland Kitchen.. 1 Tab Once Daily 11)  Multi V .... 1 Tab Once Daily 12)  Cyclobenzaprine Hcl 5 Mg Tabs (Cyclobenzaprine Hcl) .... One By Mouth Q Hs 13)  Vitamin D 2000 Unit Tabs (Cholecalciferol) .... Once Daily 14)  Etodolac 400 Mg Tabs (Etodolac) .Marland Kitchen.. 1 By Mouth Two Times A Day 15)  Pa Resveratrol 250 Mg Caps (Resveratrol) .Marland Kitchen.. 1 By Mouth Once Daily 16)  Aspirin 81 Mg Tbec (Aspirin) .... Take One Tablet By Mouth Daily 17)  Coumadin 5 Mg Tabs (Warfarin Sodium) .Marland Kitchen.. 1 Once Daily M,w,f,su--1/2 Tue,thur, and Sat  Allergies (verified): 1)  ! Vioxx 2)  ! * Voltaren Eye Gtts. and Gel  Past History:  Family  History: Last updated: 09/05/2009 Family History of Stroke F 1st degree relative <60 Family History of Colon Cancer: Father had colon surgery for cancer  Social History: Last updated: 09/05/2009 Married Former Smoker Alcohol Use - yes wine daily Illicit Drug Use - no Occupation:  retired  Risk Factors: Smoking Status: quit (12/26/2009)  Past medical, surgical, family and social histories (including risk factors) reviewed, and no changes noted (except as noted below).  Past Medical History: Reviewed history from 10/22/2009 and no changes required. Atrial fibrillation Osteoarthritis HRT-ovarian MVP Hypothyroidism Osteoporosis 1. Myxomatous degeneration of the mitral valve, status post mitral     valve repair with a good result and now stable. 2. Postoperative atrial fibrillation with no evidence of recurrence.  Hemorrhoids Adenomatous Colon Polyps 04/1986 Collagenous colitis  Past Surgical History: Reviewed history from 05/31/2008 and no  changes required. Cholecystectomy Lumbar fusion Tonsillectomy GYN surgery  D andC  Cataract extraction mitral valve repair  2008  Family History: Reviewed history from 09/05/2009 and no changes required. Family History of Stroke F 1st degree relative <60 Family History of Colon Cancer: Father had colon surgery for cancer  Social History: Reviewed history from 09/05/2009 and no changes required. Married Former Smoker Alcohol Use - yes wine daily Illicit Drug Use - no Occupation:  retired  Review of Systems  The patient denies anorexia, fever, weight loss, weight gain, vision loss, decreased hearing, hoarseness, chest pain, syncope, dyspnea on exertion, peripheral edema, prolonged cough, headaches, hemoptysis, abdominal pain, melena, hematochezia, severe indigestion/heartburn, hematuria, incontinence, genital sores, muscle weakness, suspicious skin lesions, transient blindness, difficulty walking, depression, unusual weight change,  abnormal bleeding, enlarged lymph nodes, angioedema, and breast masses.    Physical Exam  General:  alert and pale.   Head:  normocephalic and atraumatic.   Eyes:  pupils equal and pupils round.   Ears:  R ear normal and L ear normal.   Nose:  no external deformity and no nasal discharge.   Mouth:  good dentition and pharynx pink and moist.   Neck:  supple.   Lungs:  normal respiratory effort and no wheezes.   Heart:  normal rate, regular rhythm, and no murmur!!!!! Abdomen:  soft and non-tender.   Msk:  decreased ROM, joint tenderness, joint swelling, and redness over joint.   Pulses:  R and L carotid,radial,femoral,dorsalis pedis and posterior tibial pulses are full and equal bilaterally Extremities:  No clubbing, cyanosis, edema, or deformity noted with normal full range of motion of all joints.   Neurologic:  alert & oriented X3 and finger-to-nose normal.     Impression & Recommendations:  Problem # 1:  PREVENTIVE HEALTH CARE (ICD-V70.0) The pt was asked about all immunizations, health maint. services that are appropriate to their age and was given guidance on diet exercize  and weight management  Mammogram: normal (11/14/2009) Pap smear: normal (08/12/2006) Colonoscopy: DONE (09/27/2009) Bone Density: normal (10/23/2008) Td Booster: Td (09/15/2006)   Flu Vax: Fluvax 3+ (12/26/2009)   Pneumovax: Pneumovax (Medicare) (09/16/2006) Chol: 246 (12/19/2009)   HDL: 79.30 (12/19/2009)   TG: 77.0 (12/19/2009) TSH: 0.50 (12/19/2009)   Next mammogram due:: 11/2010 (12/26/2009) Next Colonoscopy due:: 09/2014 (09/27/2009) Next Bone Density due:: 10/2010 (01/03/2009)  Discussed using sunscreen, use of alcohol, drug use, self breast exam, routine dental care, routine eye care, schedule for GYN exam, routine physical exam, seat belts, multiple vitamins, osteoporosis prevention, adequate calcium intake in diet, recommendations for immunizations, mammograms and Pap smears.  Discussed exercise and  checking cholesterol.  Discussed gun safety, safe sex, and contraception.  Problem # 2:  TACHYCARDIA (ICD-785.0) PAT is the working diagnosis and the pt was placed on coumadin trial of diltiazem failed and the pt was placed on increased BB the pt due to increased stroke risk place on coumadin the pt has mitral valve repair  Problem # 3:  MITRAL VALVE PROLAPSE (ICD-424.0) with repair Her updated medication list for this problem includes:    Metoprolol Succinate 100 Mg Xr24h-tab (Metoprolol succinate) .Marland Kitchen... 1 1/2 tab qd    Aspirin 81 Mg Tbec (Aspirin) .Marland Kitchen... Take one tablet by mouth daily    Coumadin 5 Mg Tabs (Warfarin sodium) .Marland Kitchen... 1 once daily m,w,f,su--1/2 tue,thur, and sat  Echocardiogram:  - Left ventricle: The cavity size was normal. Wall thickness was     normal. Systolic function was normal. The estimated ejection  fraction was in the range of 55% to 60%. Wall motion was normal;     there were no regional wall motion abnormalities. Doppler     parameters are consistent with high ventricular filling pressure.   - Mitral valve: Prior procedures included surgical repair. Mild to     moderate regurgitation. Valve area by continuity equation (using     LVOT flow): 1.76cm^2.   - Left atrium: The atrium was mildly dilated. (10/11/2009)  Problem # 4:  OTH&UNSPEC NONINFECTIOUS GASTROENTERITIS&COLITIS (ICD-558.9)  if the clolitis is recurrent would d/c the coumadin  Discussed use of medication and role of diet. Encouraged clear liquids and electrolyte replacement fluids. Instructed to call if any signs of worsening dehydration.   Complete Medication List: 1)  Synthroid 100 Mcg Tabs (Levothyroxine sodium) .... Take 1 tablet by mouth once a day 2)  Cleocin-t 1 % Lotn (Clindamycin phosphate) .... Apply as directed 3)  Caltrate 600+d Tabs (Calcium carbonate-vitamin d tabs) .... Take 1 tablet by mouth two times a day 4)  Folbee 2.5-25-1 Mg Tabs (Folic acid-vit b6-vit b12) .... One by  mouth daily 5)  Fish Oil Caps (Omega-3 fatty acids caps) .... Take 1 by mouth two times a day qd 6)  Prilosec 20 Mg Cpdr (Omeprazole) .... One by mouth q od 7)  Metoprolol Succinate 100 Mg Xr24h-tab (Metoprolol succinate) .Marland Kitchen.. 1 1/2 tab qd 8)  Glucosamine-chondroitin 500-400 Mg Caps (Glucosamine-chondroitin) .... Two times a day 9)  Vitamin C 500 Mg Tabs (Ascorbic acid) .Marland Kitchen.. 1 tab once daily 10)  Vitamin A  .Marland Kitchen.. 1 tab once daily 11)  Multi V  .... 1 tab once daily 12)  Cyclobenzaprine Hcl 5 Mg Tabs (Cyclobenzaprine hcl) .... One by mouth q hs 13)  Vitamin D 2000 Unit Tabs (Cholecalciferol) .... Once daily 14)  Etodolac 400 Mg Tabs (Etodolac) .Marland Kitchen.. 1 by mouth two times a day 15)  Pa Resveratrol 250 Mg Caps (Resveratrol) .Marland Kitchen.. 1 by mouth once daily 16)  Aspirin 81 Mg Tbec (Aspirin) .... Take one tablet by mouth daily 17)  Coumadin 5 Mg Tabs (Warfarin sodium) .Marland Kitchen.. 1 once daily m,w,f,su--1/2 tue,thur, and sat  Other Orders: Flu Vaccine 43yrs + MEDICARE PATIENTS (Z6109) Administration Flu vaccine - MCR (U0454)  Patient Instructions: 1)  Please schedule a follow-up appointment in 3 months. Prescriptions: FOLBEE 2.5-25-1 MG TABS (FOLIC ACID-VIT B6-VIT B12) one by mouth daily  #30 x 11   Entered and Authorized by:   Stacie Glaze MD   Signed by:   Stacie Glaze MD on 12/26/2009   Method used:   Electronically to        [facility]* (retail)       [address] [place], [place]  [address]       Ph: [phone]       Fax: [phone]   RxID:   [id number]    Preventive Care Screening  Mammogram:    Date:  11/14/2009    Next Due:  11/2010    Results:  normal      Flu Vaccine Consent Questions     Do you have a history of severe allergic reactions to this vaccine? no    Any prior history of allergic reactions to egg and/or gelatin? no    Do you have a sensitivity to the preservative Thimersol? no    Do you have a past history of Guillan-Barre  Syndrome? no    Do you currently have an acute febrile illness? no    Have you ever had a severe reaction to latex? no    Vaccine information given and explained to patient? yes    Are you currently pregnant? no    Lot Number:[id number]   Exp Date:09/14/2010   Site Given  Left Deltoid IM

## 2010-04-16 NOTE — Miscellaneous (Signed)
Summary: [name] September 05 2009 [name]  Clinical Lists Changes September 05, 2009 [name]                                 CONSULTATION      Theresa [name] is a 77 year old patient of [name].  We are asked to see   her in the [name] for rapid atrial flutter.      The patient initially was being seen in our GI department for some   diarrhea that she has had since April.  She was noted to have a rapid   heart rate.  I had been sent an EKG to our [name] and she   was in rapid atrial flutter at a rate of 140.      In talking to the patient, she really was not aware of such a rapid   heart rate.  She has had some fatigue and shortness of breath, though   she attributed this to the diarrhea that is being worked up.  She is   status post mitral valve repair at [name] in 2008.  I do not have any   records regarding this except that she definitely had an annuloplasty   ring.  I am unsure as to whether she had a maze or had a left atrial   appendage ligated.      Since that time in followup, she has had 2 echos, the most recent echo   done last year showed moderate residual MR with only mild left atrial   enlargement and normal LV function.      The patient normally takes Lopressor 50 in the morning and 25 at night.      As far as I can tell from reading [name] note, she has not had   recurrences of atrial arrhythmia since her surgery.  She did have a   brief period of paroxysmal atrial fibrillation at the time of her   surgery.      She has no documented coronary artery disease.  She is not having chest   pain.  She has mild exertional dyspnea.  There has been no TIA-type   symptoms.  She has not been on blood thinners and her aspirin has   actually been stopped recently due to her diarrhea.  She is currently   asymptomatic while in the emergency [name].  She converted from atrial   flutter at a rate of 140 to a normal sinus  rhythm at a rate of 80.      I discussed with [name]'s followup care.  I do not think that she   needs to have Coumadin started.  I will have her follow up with Dr.   Juanda [name] in 3-4 weeks.  She will continue to have her GI problems worked   up, and we will add Cardizem CD 120 mg to her beta-blocker.      The patient is anxious to go home.      REVIEW OF SYSTEMS:  Her 10-point review of systems is otherwise   negative.      PAST MEDICAL HISTORY:  Remarkable for:   1. Scoliosis.   2. Spinal stenosis.   3. Hypothyroidism.   4. Hypertension.   5. Osteoporosis.   6. History of mitral valve repair in 2008.   7. Recent diarrheal illness.  ALLERGIES:  She denies any allergies.      The patient is happily married.  [name] is her primary care doctor.   Her husband was with her in the [name].  She is active, though she has   been a bit weak lately due to her diarrheal illness.  She does not smoke   or drink.      MEDICATIONS:   1. Levothyroxine.   2. Metoprolol 50 in morning 25 at night.   3. Premarin and aspirin, which have recently been held.      GENERAL:  Remarkable for a thin, somewhat pale female in no distress.   VITAL SIGNS:  Blood pressure is 127/80, pulse is 80 and regular,   respiratory rate 14, afebrile.   HEENT:  Unremarkable.  Carotids are without bruit.  No lymphadenopathy,   thyromegaly, or JVP elevation.   LUNGS:  Clear diaphragmatic motion.  No wheezing.   HEART:  S1and S2 without any murmur at the apex.  She is status post   minimally invasive right thoracotomy for mitral valve repair.   ABDOMEN:  Benign.  Bowel sounds positive.  No AAA.  No tenderness.  No   bruit.  No hepatosplenomegaly, hepatojugular reflux or tenderness.   EXTREMITIES:  Distal pulses are intact with no edema.   NEUROLOGIC:  Nonfocal.   SKIN:  Warm and dry.  No muscle weakness.      EKG reviewed in the [name] was read as accelerated junctional rhythm,   but she was in atrial  flutter at a rate of 140s.  There were no ischemic   changes.  Telemetry review here in the ER shows normal sinus rhythm at a   rate of 80.  She has no current blood work.  Chest x-ray shows mitral   valve repair and annuloplasty ring with no congestive heart failure or   active disease.  There is a retained epicardial pacer wire.      IMPRESSION:   1. Paroxysmal atrial flutter, short lived with only mild dyspnea, not       certainly related to rhythm.  Add Cardizem CD 120 mg a day,       discharge the patient home to follow up with [name].  No       indication for Coumadin at this time.  We would need to review       operative report to see if her left atrial appendage had been       ligated.  The patient will have her Cardizem CD, called into [facility] for 1 month supply.  The[name] may stop this on followup       visit and just increase her Lopressor to 50 b.i.d.  If she were to       have recurrent arrhythmias, it would be reasonable to have her see       our EP doctors since her initial rhythm appeared to be flutter.   2. Diarrheal illness.  Follow up with GI department.  She has been       treated with antibiotics.  There was no clear positive titer for       Clostridium difficile that I could see.  There is no clinical       history to support ischemic colitis.  She had a colonoscopy that       was essentially normal back in 2010 and will follow up with Dr.  Russella Dar for this.   3. Hypothyroidism.  TSH last year was normal.  She does not appear to       be hyperthyroid at this time and follow up TSH and T4 with Dr.       Lovell [name].               The[name]. Theresa Emms, MD, [name]            PCN/MEDQ  D:  09/05/2009  T:  09/06/2009  Job:  [id number]

## 2010-04-16 NOTE — Medication Information (Signed)
Summary: [name] pt/started Coumadin on 11/28/09/5mg  daily/ewj  Anticoagulant Therapy  Managed by: Louann Sjogren, PharmD Referring MD: [name] PCP: Theresa Glaze MD Supervising MD: Excell Seltzer MD,Michael Indication 1: Atrial Fibrillation Lab Used: [facility] [place] Site: [address] INR POC 2.8 INR RANGE 2.0-3.0  Dietary changes: no    Health status changes: no    Bleeding/hemorrhagic complications: no    Recent/future hospitalizations: no    Any changes in medication regimen? no    Recent/future dental: no  Any missed doses?: no       Is patient compliant with meds? yes      Comments: This is a new start of Coumadin for afib-- patient in on day 6 of Coumadin initiation.  Pt started on 5mg  daily.  Pt educated on bleeding risks, dietary concerns, medication interactions, etc.  Given instruction book.  Expressed understanding.   Current Medications (verified): 1)  Synthroid 100 Mcg  Tabs (Levothyroxine Sodium) .... Take 1 Tablet By Mouth Once A Day 2)  Cleocin-T 1 % Lotn (Clindamycin Phosphate) .... Apply As Directed 3)  Caltrate 600+d   Tabs (Calcium Carbonate-Vitamin D Tabs) .... Take 1 Tablet By Mouth Two Times A Day 4)  Neurpath-B 3-35-2 Mg Tabs (L-Methylfolate-B6-B12) .Marland Kitchen.. 1 Once Daily 5)  Fish Oil   Caps (Omega-3 Fatty Acids Caps) .... Take 1 By Mouth Two Times A Day Qd 6)  Prilosec 20 Mg  Cpdr (Omeprazole) .... One By Mouth Q Od 7)  Metoprolol Succinate 100 Mg Xr24h-Tab (Metoprolol Succinate) .Marland Kitchen.. 1 1/2 Tab Qd 8)  Glucosamine-Chondroitin 500-400 Mg  Caps (Glucosamine-Chondroitin) .... Two Times A Day 9)  Vitamin C 500 Mg  Tabs (Ascorbic Acid) .Marland Kitchen.. 1 Tab Once Daily 10)  Vitamin A .Marland Kitchen.. 1 Tab Once Daily 11)  Multi V .... 1 Tab Once Daily 12)  Cyclobenzaprine Hcl 5 Mg Tabs (Cyclobenzaprine Hcl) .... One By Mouth Q Hs 13)  Vitamin D 2000 Unit Tabs (Cholecalciferol) .... Once Daily 14)  Etodolac 400 Mg Tabs (Etodolac) .Marland Kitchen.. 1 By Mouth Two Times A Day 15)   Diltiazem Hcl Er Beads 120 Mg Xr24h-Cap (Diltiazem Hcl Er Beads) .... Take One Capsule By Mouth Daily 16)  Pa Resveratrol 250 Mg Caps (Resveratrol) .Marland Kitchen.. 1 By Mouth Once Daily 17)  Aspirin 81 Mg Tbec (Aspirin) .... Take One Tablet By Mouth Daily 18)  Coumadin 5 Mg Tabs (Warfarin Sodium) .Marland Kitchen.. 1 Once Daily As Directed  Allergies (verified): 1)  ! Vioxx 2)  ! * Voltaren Eye Gtts. and Gel  Anticoagulation Management History:      The patient comes in today for her initial visit for anticoagulation therapy.  Positive risk factors for bleeding include an age of 75 years or older.  The bleeding index is 'intermediate risk'.  Positive CHADS2 values include Age > 95 years old.  Today's INR is 2.8.  Anticoagulation responsible provider: Excell Seltzer MD,Michael.  INR POC: 2.8.  Cuvette Lot#: [id number].    Anticoagulation Management Assessment/Plan:      The patient's current anticoagulation dose is Coumadin 5 mg tabs: 1 once daily AS DIRECTED.  The target INR is 2.0-3.0.  The next INR is due 12/10/2009.  Anticoagulation instructions were given to patient.  Results were reviewed/authorized by Louann Sjogren, PharmD.  She was notified by Louann Sjogren, PharmD.         Current Anticoagulation Instructions: 12/03/2009  INR 2.8  Continue with 5mg  daily.  Follow up to clinic in 1 week.

## 2010-04-16 NOTE — Procedures (Signed)
Summary: Colonoscopy   Colonoscopy  Procedure date:  11/08/2003  Findings:      Results: Hemorrhoids.     Location:  [facility].    Procedures Next Due Date:    Colonoscopy: 11/2008  Colonoscopy  Procedure date:  11/08/2003  Findings:      Results: Hemorrhoids.     Location:  [facility].    Procedures Next Due Date:    Colonoscopy: 11/2008 Patient Name: Theresa, [name]. MRN:  Procedure Procedures: Colonoscopy CPT: [phone].  Personnel: Endoscopist: Venita Lick. Russella Dar, MD, Clementeen Graham.  Exam Location: Exam performed in Outpatient Clinic. Outpatient  Patient Consent: Procedure, Alternatives, Risks and Benefits discussed, consent obtained, from patient. Consent was obtained by the RN.  Indications  Surveillance of: Adenomatous Polyp(s).  Comments: Father with colon ca. History  Current Medications: Patient is not currently taking Coumadin.  Pre-Exam Physical: Performed Nov 08, 2003. Entire physical exam was normal.  Exam Exam: Extent of exam reached: Cecum, extent intended: Cecum.  The cecum was identified by appendiceal orifice and IC valve. Colon retroflexion performed. ASA Classification: II. Tolerance: excellent.  Monitoring: Pulse and BP monitoring, Oximetry used. Supplemental O2 given.  Colon Prep Used Miralax for colon prep. Prep results: good.  Sedation Meds: Patient assessed and found to be appropriate for moderate (conscious) sedation. Fentanyl 75 mcg. given IV. Versed 8 mg. given IV.  Comments: Tortuous colon-moderately difficult procedure Findings NORMAL EXAM: Cecum to Sigmoid Colon.  HEMORRHOIDS: Internal. Size: Small. Not bleeding. Not thrombosed. ICD9: Hemorrhoids, Internal: 455.0.   Assessment  Diagnoses: 455.0: Hemorrhoids, Internal.   Events  Unplanned Interventions: No intervention was required.  Unplanned Events: There were no complications. Plans Medication Plan: Continue current  medications.  Disposition: After procedure patient sent to recovery. After recovery patient sent home.  Scheduling/Referral: Colonoscopy, to [facility] T. Russella Dar, MD, [facility], around Nov 07, 2008.    This report was created from the original endoscopy report, which was reviewed and signed by the above listed endoscopist.    cc: Stacie Glaze, MD

## 2010-04-16 NOTE — Letter (Signed)
Summary: Patient Notice- Colon Biospy Results  [facility]  [address] [place], [place] [address]   Phone: [phone]  Fax: [phone]        October 01, 2009 MRN: [id number]    Theresa [name] [address] [place], [place]  [address]    Dear Ms. Kate Sable,  I am pleased to inform you that the biopsies taken during your recent colonoscopy did not show any evidence of cancer upon pathologic examination. The biopsies showed collagenous colitis.  I recommend that you have follow up colonoscopy in 5 years given your polyps.  Continue with the treatment plan as outlined on the day of your      exam.  Please call us if you are having persistent problems or have questions about your condition that have not been fully answered at this time.  Sincerely,  Meryl Dare MD [facility]  This letter has been electronically signed by your physician.   Appended Document: Patient Notice- Colon Biospy Results letter mailed

## 2010-04-16 NOTE — Progress Notes (Signed)
Summary: continued diarhea.  Phone Note Call from Patient   Caller: Patient Call For: Stacie Glaze MD Summary of Call: Pt is no better.......still having diarrhea>  ?  What next? [phone] Initial call taken by: Lynann Beaver CMA,  August 15, 2009 10:35 AM  Follow-up for Phone Call        if the cloestid is not working hold the synthroid for 2 dats and resume 1/2 tablet call with effect of this in 1 week Follow-up by: Stacie Glaze MD,  August 15, 2009 11:18 AM  Additional Follow-up for Phone Call Additional follow up Details #1::        Pt given Dr Lovell Sheehan" recommendations. Additional Follow-up by: Lynann Beaver CMA,  August 15, 2009 11:31 AM     Appended Document: continued diarhea. Pt continues with diarrhea with the change in her thyroid med.  Diarrhea got worse when she went back to the ASA, so she stopped it.  Arthritis is bothering her, and has gone back on the Lodine 400 mg. daily. [phone] Do you think it could it be the [facility] that she takes at bedtime.

## 2010-04-16 NOTE — Progress Notes (Signed)
  Phone Note Other Incoming   Caller: dr [name] Summary of Call: dr Eden Emms is seeing pt in the ER, will set up appt for pt to see dr Juanda Chance in 3-4 weeks for atrial flutter. script called to pharm per dr Verdis Prime, RN  September 05, 2009 5:12 PM     New/Updated Medications: DILTIAZEM HCL ER BEADS 120 MG XR24H-CAP (DILTIAZEM HCL ER BEADS) Take one capsule by mouth daily Prescriptions: DILTIAZEM HCL ER BEADS 120 MG XR24H-CAP (DILTIAZEM HCL ER BEADS) Take one capsule by mouth daily  #30 x 12   Entered by:   Deliah Goody, RN   Authorized by:   Colon Branch, MD, [facility]   Signed by:   Deliah Goody, RN on 09/05/2009   Method used:   Electronically to        [facility]* (retail)       [address] [place], [place]  [address]       Ph: [phone]       Fax: [phone]   RxID:   [id number]

## 2010-04-16 NOTE — Miscellaneous (Signed)
Summary: [facility]  Clinical Lists Changes  Medications: Added new medication of MOVIPREP 100 GM  SOLR (PEG-KCL-NACL-NASULF-NA ASC-C) As per prep instructions. - Signed Rx of MOVIPREP 100 GM  SOLR (PEG-KCL-NACL-NASULF-NA ASC-C) As per prep instructions.;  #1 x 0;  Signed;  Entered by: Ezra Sites RN;  Authorized by: Meryl Dare MD Clementeen Graham;  Method used: Electronically to [facility]*, [address] [place] [place], [place], [place]  [address], Ph: [phone], Fax: [phone] Observations: Added new observation of ALLERGY REV: Done (09/21/2009 9:54)    Prescriptions: MOVIPREP 100 GM  SOLR (PEG-KCL-NACL-NASULF-NA ASC-C) As per prep instructions.  #1 x 0   Entered by:   Ezra Sites RN   Authorized by:   Meryl Dare MD [facility]   Signed by:   Ezra Sites RN on 09/21/2009   Method used:   Electronically to        [facility]* (retail)       [address] [place], [place]  [address]       Ph: [phone]       Fax: [phone]   RxID:   [id number]

## 2010-04-16 NOTE — Procedures (Signed)
Summary: Colonoscopy/[place]  Colonoscopy/[place]   Imported By: Sherian Rein 10/22/2009 14:09:16  _____________________________________________________________________  External Attachment:    Type:   Image     Comment:   External Document

## 2010-04-16 NOTE — Progress Notes (Signed)
Summary: questions  Phone Note Call from Patient   Caller: Patient Call For: Stacie Glaze MD Summary of Call: How long should she take the Colestd?  Diarrhea is better right now. Can she go back on ASA? Lodine? [phone] Initial call taken by: Lynann Beaver CMA,  Jul 26, 2009 10:02 AM  Follow-up for Phone Call        take for the remainder of week and then next week can take every other day and may start back on lodine and asa now per dr Lovell Sheehan Follow-up by: Willy Eddy, LPN,  Jul 26, 2009 11:34 AM  Additional Follow-up for Phone Call Additional follow up Details #1::        Pt notified and per Dr. Lovell Sheehan .....Marland KitchenMarland KitchenTake ASA 325 mg 2 two times a day and DC Lodine for now. Additional Follow-up by: Lynann Beaver CMA,  Jul 26, 2009 11:41 AM    New/Updated Medications: ASPIRIN 325 MG TABS (ASPIRIN) 2 by mouth bid

## 2010-04-16 NOTE — Assessment & Plan Note (Signed)
Summary: ARTHIRITIS IN FINGERS/REQ INJ/MED CHECK/CJR   Vital Signs:  Patient profile:   77 year old female Height:      62 inches Weight:      118 pounds BMI:     21.66 Temp:     98.2 degrees F oral Pulse rate:   76 / minute Resp:     14 per minute BP sitting:   132 / 70  (left arm)  Vitals Entered By: Willy Eddy, LPN (April 17, 2009 1:53 PM) CC: c/o finger joint pain- requesting injection in 2 joints   Primary Care Provider:  Stacie Glaze MD  CC:  c/o finger joint pain- requesting injection in 2 joints.  History of Present Illness: increased pan in the finger joints wth hx of osteoarthritis follow up for hypothrodism reveiwed the tsh resistant rash under arms previouslyu felt to be  "ring worm"? improved with the lotrimen but then recurred itches  and sometime burns   Preventive Screening-Counseling & Management  Alcohol-Tobacco     Smoking Status: quit     Year Quit: 1962  Current Problems (verified): 1)  Raynaud's Syndrome  (ICD-443.0) 2)  Scoliosis, Lumbar Spine  (ICD-737.30) 3)  Preventive Health Care  (ICD-V70.0) 4)  Vasomotor Rhinitis  (ICD-477.9) 5)  Cellulitis, Hand, Left  (ICD-682.4) 6)  Osteoarthritis, Hands, Bilateral  (ICD-715.94) 7)  Osteoporosis  (ICD-733.00) 8)  Hypothyroidism  (ICD-244.9) 9)  Mitral Regurgitation  (ICD-424.0) 10)  Mitral Valve Prolapse  (ICD-424.0) 11)  Osteoarthritis  (ICD-715.90) 12)  Hx of Atrial Fibrillation  (ICD-427.31)  Current Medications (verified): 1)  Synthroid 100 Mcg  Tabs (Levothyroxine Sodium) .... Take 1 Tablet By Mouth Once A Day 2)  Cleocin-T 1 % Lotn (Clindamycin Phosphate) .... Apply As Directed 3)  Caltrate 600+d   Tabs (Calcium Carbonate-Vitamin D Tabs) .... Take 1 Tablet By Mouth Two Times A Day 4)  Aspirin 81 Mg  Tabs (Aspirin) .... Take 1 Tablet By Mouth Once A Day 5)  Triveen-Cf Nac 6-2-600 Mg Tabs (Methylfol-Methylcob-Acetylcyst) .... One P0 Daily ( Hold For Her To Calls) 6)  Fish Oil    Caps (Omega-3 Fatty Acids Caps) .... Take 1 By Mouth Two Times A Day Qd 7)  Prilosec 20 Mg  Cpdr (Omeprazole) .... Once Daily 8)  Metoprolol Succinate 50 Mg  Tb24 (Metoprolol Succinate) .... One in Am and 1/2 in Pm 9)  Etodolac 400 Mg  Tabs (Etodolac) .... One By Mouth Bid 10)  Glucosamine-Chondroitin 500-400 Mg  Caps (Glucosamine-Chondroitin) .... Two Times A Day 11)  Vitamin D 1000 Unit  Caps (Cholecalciferol) .... Once Daily 12)  Ultram 50 Mg  Tabs (Tramadol Hcl) .... One By Mouth Q 6 Hours As Needed Pain 13)  Muscadine Pus .... Daily 14)  Vitamin C 500 Mg  Tabs (Ascorbic Acid) .Marland Kitchen.. 1 Tab Once Daily 15)  Vitamin A .Marland Kitchen.. 1 Tab Once Daily 16)  Multi V .... 1 Tab Once Daily 17)  Pennsaid 1.5 % Soln (Diclofenac Sodium) .... Apply To Hand Joints 10 Drops  Four  Time A Day 18)  Cyclobenzaprine Hcl 5 Mg Tabs (Cyclobenzaprine Hcl) .... One By Mouth Q Hs  Allergies: 1)  ! Vioxx 2)  ! * Voltaren Eye Gtts. and Gel   Impression & Recommendations:  Problem # 1:  DERMATOPHYTOSIS OF THE BODY (ICD-110.5) Assessment Unchanged diflucan 100 mg by mouth daily for 10 dasy and continue the topical applications  Problem # 2:  OSTEOARTHRITIS, HANDS, BILATERAL (ICD-715.94) Assessment: Deteriorated  Informed  consent obtained and then three finger jjoint was prepped in a sterile manor and 10 mg depo and 1/8 cc 1% lidocaine injected into the synovial space. After care discussed. Pt tolerated procedure well.  The following medications were removed from the medication list:    Etodolac 400 Mg Tabs (Etodolac) ..... One by mouth bid Her updated medication list for this problem includes:    Ultram 50 Mg Tabs (Tramadol hcl) ..... One by mouth q 6 hours as needed pain  Discussed use of medications, application of heat or cold, and exercises.   Orders: Joint Aspirate / Injection, Small (11914) Depo-Medrol 20mg  (J1020)  Problem # 3:  RAYNAUD'S SYNDROME (ICD-443.0) increased aspirin   Problem # 4:   HYPOTHYROIDISM (ICD-244.9)  Her updated medication list for this problem includes:    Synthroid 100 Mcg Tabs (Levothyroxine sodium) .Marland Kitchen... Take 1 tablet by mouth once a day  Labs Reviewed: TSH: 0.14 (12/26/2008)   Free T4: 1.2 (08/29/2008)    Chol: 252 (12/26/2008)   HDL: 82.40 (12/26/2008)   TG: 97.0 (12/26/2008)  Complete Medication List: 1)  Synthroid 100 Mcg Tabs (Levothyroxine sodium) .... Take 1 tablet by mouth once a day 2)  Cleocin-t 1 % Lotn (Clindamycin phosphate) .... Apply as directed 3)  Caltrate 600+d Tabs (Calcium carbonate-vitamin d tabs) .... Take 1 tablet by mouth two times a day 4)  Aspirin 325 Mg Tabs (aspirin)  .... Take 2 tablet by mouth two times a day 5)  Triveen-cf Nac 6-2-600 Mg Tabs (Methylfol-methylcob-acetylcyst) .... One p0 daily ( hold for her to calls) 6)  Fish Oil Caps (Omega-3 fatty acids caps) .... Take 1 by mouth two times a day qd 7)  Prilosec 20 Mg Cpdr (Omeprazole) .... One by mouth q od 8)  Metoprolol Succinate 50 Mg Tb24 (Metoprolol succinate) .... One in am and 1/2 in pm 9)  Glucosamine-chondroitin 500-400 Mg Caps (Glucosamine-chondroitin) .... Two times a day 10)  Vitamin D 1000 Unit Caps (Cholecalciferol) .... Once daily 11)  Ultram 50 Mg Tabs (Tramadol hcl) .... One by mouth q 6 hours as needed pain 12)  Muscadine Pus  .... Daily 13)  Vitamin C 500 Mg Tabs (Ascorbic acid) .Marland Kitchen.. 1 tab once daily 14)  Vitamin A  .Marland Kitchen.. 1 tab once daily 15)  Multi V  .... 1 tab once daily 16)  Cyclobenzaprine Hcl 5 Mg Tabs (Cyclobenzaprine hcl) .... One by mouth q hs 17)  Fluconazole 100 Mg Tabs (Fluconazole) .... One by mouth daily for 10 day  Patient Instructions: 1)  Please schedule a follow-up appointment keep march appointment Prescriptions: FLUCONAZOLE 100 MG TABS (FLUCONAZOLE) one by mouth daily for 10 day  #10 x 0   Entered and Authorized by:   Stacie Glaze MD   Signed by:   Stacie Glaze MD on 04/17/2009   Method used:   Electronically to         [facility]* (retail)       [address] [place], [place]  [address]       Ph: [phone]       Fax: [phone]   RxID:   [id number]   Appended Document: Orders Update    Clinical Lists Changes  Orders: Added new Service order of Prescription Created Electronically [phone]) - Signed

## 2010-04-16 NOTE — Medication Information (Signed)
Summary: rov/sel  Anticoagulant Therapy  Managed by: Weston Brass, PharmD Referring MD: Juanda Chance PCP: Stacie Glaze MD Supervising MD: Antoine Poche MD, Fayrene Fearing Indication 1: Atrial Fibrillation Lab Used: [facility] [place] Site: [address] INR POC 2.4 INR RANGE 2.0-3.0  Dietary changes: no    Health status changes: no    Bleeding/hemorrhagic complications: no    Recent/future hospitalizations: no    Any changes in medication regimen? no    Recent/future dental: yes     Details: Has tooth cleaning appointment towards end of October  Any missed doses?: no       Is patient compliant with meds? yes       Allergies: 1)  ! Vioxx 2)  ! * Voltaren Eye Gtts. and Gel  Anticoagulation Management History:      The patient is taking warfarin and comes in today for a routine follow up visit.  Positive risk factors for bleeding include an age of 75 years or older.  The bleeding index is 'intermediate risk'.  Positive CHADS2 values include Age > 26 years old.  Her last INR was 2.8.  Anticoagulation responsible provider: Antoine Poche MD, Fayrene Fearing.  INR POC: 2.4.  Cuvette Lot#: [id number].  Exp: 01/2011.    Anticoagulation Management Assessment/Plan:      The patient's current anticoagulation dose is Coumadin 5 mg tabs: 1 once daily AS DIRECTED.  The target INR is 2.0-3.0.  The next INR is due 01/07/2010.  Anticoagulation instructions were given to patient.  Results were reviewed/authorized by Weston Brass, PharmD.  She was notified by Haynes Hoehn, PharmD Candidate.         Prior Anticoagulation Instructions: INR 2.9  Changed dose to 1/2 tablet on Thursday. Pt will now take 1 tablet everyday except 1/2 tablet on Tuesday, Thursday, and Saturday.  Current Anticoagulation Instructions: INR 2.4  Continue Coumadin as scheduled:  1 tablet every day of the week, except 1/2 tablet on Tuesday, Thursday, and Saturday.  Return to clinic in 2 weeks.

## 2010-04-16 NOTE — Miscellaneous (Signed)
Summary: Appointment Canceled  Appointment status changed to canceled by [employer] on 09/25/2009 10:42 AM.  Cancellation Comments --------------------- echo/mitral valve repair/hm  Appointment Information ----------------------- Appt Type:  CARDIOLOGY ANCILLARY VISIT      Date:  Wednesday, November 28, 2009      Time:  9:30 AM for 60 min   Urgency:  Routine   Made By:  Pearson Grippe  To Visit:  [id number]    Reason:  echo/mitral valve repair/hm  Appt Comments ------------- -- 09/25/09 10:42: ([facility]) CANCELED -- echo/mitral valve repair/hm -- 09/25/09 8:50: ([facility]) BOOKED -- Routine CARDIOLOGY ANCILLARY VISIT at 11/28/2009 9:30 AM for 60 min echo/mitral valve repair/hm

## 2010-04-16 NOTE — Progress Notes (Signed)
Summary: loose stools x2 weeks  Phone Note Call from Patient Call back at [facility] Phone [phone]   Summary of Call: Diarhea & loose stools x 2 weeks.  Taking Citracel  3-4 days hoping it would firm up stool.  Has helped a litltle.  2Xs  one am & one pm that are still very loose.  No fever.  No abd pain or nausea.  Wondered if med changes a month or so ago would be cause.  Started ASA 325 mg 2 two times a day in place of lodine.  Triveen-CF mac, generic for cerefollin, started.  Prilosec back up to one daily about a week ago thinking that part of problem.  Vit D increased to 2000 from 1000 International Units.  Mucle relaxant cyclobenzaprine a new med at hs.  [facility] or [facility] for longterm changes.  NKDA.   Initial call taken by: Rudy Jew, RN,  July 02, 2009 12:12 PM  Follow-up for Phone Call        per dr Lovell Sheehan- stop asa and give flagyl 500 two times a day for 10 days Follow-up by: Willy Eddy, LPN,  July 02, 2009 12:44 PM  Additional Follow-up for Phone Call Additional follow up Details #1::        Phone Call Completed Additional Follow-up by: Rudy Jew, RN,  July 02, 2009 12:57 PM    New/Updated Medications: FLAGYL 500 MG TABS (METRONIDAZOLE) One two times a day x 10 days Prescriptions: FLAGYL 500 MG TABS (METRONIDAZOLE) One two times a day x 10 days  #20 x 0   Entered by:   Rudy Jew, RN   Authorized by:   Stacie Glaze MD   Signed by:   Rudy Jew, RN on 07/02/2009   Method used:   Electronically to        [facility]* (retail)       [address] [place], [place]  [address]       Ph: [phone]       Fax: [phone]   RxID:   [id number]

## 2010-04-16 NOTE — Medication Information (Signed)
Summary: rov/amr  Anticoagulant Therapy  Managed by: Weston Brass, PharmD Referring MD: Juanda Chance PCP: Stacie Glaze MD Supervising MD: Excell [place] MD,Michael Indication 1: Atrial Fibrillation Lab Used: [facility] [place] Site: [address] INR POC 4.5 INR RANGE 2.0-3.0  Dietary changes: no    Health status changes: yes       Details: Has had some diarrhea over the past week (thinks it may be because of the Coumadin)  Bleeding/hemorrhagic complications: no    Recent/future hospitalizations: no    Any changes in medication regimen? no    Recent/future dental: no  Any missed doses?: no       Is patient compliant with meds? yes       Allergies: 1)  ! Vioxx 2)  ! * Voltaren Eye Gtts. and Gel  Anticoagulation Management History:      The patient is taking warfarin and comes in today for a routine follow up visit.  Positive risk factors for bleeding include an age of 75 years or older.  The bleeding index is 'intermediate risk'.  Positive CHADS2 values include Age > 75 years old.  Her last INR was 2.8.  Anticoagulation responsible provider: Excell [place] MD,Michael.  INR POC: 4.5.  Cuvette Lot#: [id number].  Exp: 01/2011.    Anticoagulation Management Assessment/Plan:      The patient's current anticoagulation dose is Coumadin 5 mg tabs: 1 once daily AS DIRECTED.  The target INR is 2.0-3.0.  The next INR is due 12/17/2009.  Anticoagulation instructions were given to patient.  Results were reviewed/authorized by Weston Brass, PharmD.  She was notified by Harrel Carina, PharmD candidate.         Prior Anticoagulation Instructions: 12/03/2009  INR 2.8  Continue with 5mg  daily.  Follow up to clinic in 1 week.  Current Anticoagulation Instructions: INR 4.5  Do not take any Coumadin tomorrow (Tuesday) or Wednesday. Then change dose to 1 tablet everyday except take 1/2 tablet on Tuesdays and Saturdays. Re-check INR in 1 week.

## 2010-04-18 NOTE — Assessment & Plan Note (Signed)
Summary: 3 MNTH ROV//SLM   Vital Signs:  Patient profile:   77 year old female Height:      62 inches Weight:      110 pounds BMI:     20.19 Temp:     98.2 degrees F oral Pulse rate:   68 / minute Resp:     14 per minute BP sitting:   122 / 70  (left arm)  Vitals Entered By: Willy Eddy, LPN (March 26, 2010 10:11 AM) CC: roa--, Hypertension Management Is Patient Diabetic? No   Primary Care Provider:  Stacie Glaze MD  CC:  roa-- and Hypertension Management.  History of Present Illness: The pt has been stable on the coumadin but does not report and complications from therapy. He is complaint and the PT have been relatively stable. GERD has been stable on the prilosec without gastritis ( increased risk wit the coumadin noted) Severe OA with multiple joint deformaties... hx of medrol injections. Has back pain and stiffness in the AM ( has back "hardware") AF stable     Hypertension History:      She denies headache, chest pain, palpitations, dyspnea with exertion, orthopnea, PND, peripheral edema, visual symptoms, neurologic problems, syncope, and side effects from treatment.  the metoprolol is for control of the AF.        Positive major cardiovascular risk factors include female age 15 years old or older.  Negative major cardiovascular risk factors include non-tobacco-user status.     Preventive Screening-Counseling & Management  Alcohol-Tobacco     Smoking Status: quit     Year Quit: 1962     Tobacco Counseling: not indicated; no tobacco use  Problems Prior to Update: 1)  Coumadin Therapy  (ICD-V58.61) 2)  Encounter For Therapeutic Drug Monitoring  (ICD-V58.83) 3)  Personal Hx Colonic Polyps  (ICD-V12.72) 4)  Oth&unspec Noninfectious Gastroenteritis&colitis  (ICD-558.9) 5)  Change in Bowels  (ICD-787.99) 6)  Atrial Fibrillation, Paroxysmal  (ICD-427.31) 7)  Diarrhea  (ICD-787.91) 8)  Diarrhea  (ICD-787.91) 9)  Gastroesophageal Reflux Disease, Mild   (ICD-530.81) 10)  Dermatophytosis of The Body  (ICD-110.5) 11)  Raynaud's Syndrome  (ICD-443.0) 12)  Scoliosis, Lumbar Spine  (ICD-737.30) 13)  Preventive Health Care  (ICD-V70.0) 14)  Vasomotor Rhinitis  (ICD-477.9) 15)  Cellulitis, Hand, Left  (ICD-682.4) 16)  Osteoarthritis, Hands, Bilateral  (ICD-715.94) 17)  Osteoporosis  (ICD-733.00) 18)  Hypothyroidism  (ICD-244.9) 19)  Mitral Regurgitation  (ICD-424.0) 20)  Mitral Valve Prolapse  (ICD-424.0) 21)  Osteoarthritis  (ICD-715.90) 22)  Hx of Atrial Fibrillation  (ICD-427.31)  Current Problems (verified): 1)  Coumadin Therapy  (ICD-V58.61) 2)  Encounter For Therapeutic Drug Monitoring  (ICD-V58.83) 3)  Personal Hx Colonic Polyps  (ICD-V12.72) 4)  Oth&unspec Noninfectious Gastroenteritis&colitis  (ICD-558.9) 5)  Change in Bowels  (ICD-787.99) 6)  Atrial Fibrillation, Paroxysmal  (ICD-427.31) 7)  Diarrhea  (ICD-787.91) 8)  Diarrhea  (ICD-787.91) 9)  Gastroesophageal Reflux Disease, Mild  (ICD-530.81) 10)  Dermatophytosis of The Body  (ICD-110.5) 11)  Raynaud's Syndrome  (ICD-443.0) 12)  Scoliosis, Lumbar Spine  (ICD-737.30) 13)  Preventive Health Care  (ICD-V70.0) 14)  Vasomotor Rhinitis  (ICD-477.9) 15)  Cellulitis, Hand, Left  (ICD-682.4) 16)  Osteoarthritis, Hands, Bilateral  (ICD-715.94) 17)  Osteoporosis  (ICD-733.00) 18)  Hypothyroidism  (ICD-244.9) 19)  Mitral Regurgitation  (ICD-424.0) 20)  Mitral Valve Prolapse  (ICD-424.0) 21)  Osteoarthritis  (ICD-715.90) 22)  Hx of Atrial Fibrillation  (ICD-427.31)  Medications Prior to Update: 1)  Synthroid 100  Mcg  Tabs (Levothyroxine Sodium) .... Take 1 Tablet By Mouth Once A Day 2)  Cleocin-T 1 % Lotn (Clindamycin Phosphate) .... Apply As Directed 3)  Caltrate 600+d   Tabs (Calcium Carbonate-Vitamin D Tabs) .... Take 1 Tablet By Mouth Two Times A Day 4)  Folbee 2.5-25-1 Mg Tabs (Folic Acid-Vit B6-Vit B12) .... One By Mouth Daily 5)  Fish Oil   Caps (Omega-3 Fatty Acids Caps)  .... Take 1 By Mouth Two Times A Day Qd 6)  Prilosec 20 Mg  Cpdr (Omeprazole) .... One By Mouth Q Od 7)  Metoprolol Succinate 100 Mg Xr24h-Tab (Metoprolol Succinate) .Marland Kitchen.. 1 1/2 Tab Qd 8)  Glucosamine-Chondroitin 500-400 Mg  Caps (Glucosamine-Chondroitin) .... Two Times A Day 9)  Vitamin C 500 Mg  Tabs (Ascorbic Acid) .Marland Kitchen.. 1 Tab Once Daily 10)  Vitamin A .Marland Kitchen.. 1 Tab Once Daily 11)  Multi V .... 1 Tab Once Daily 12)  Cyclobenzaprine Hcl 5 Mg Tabs (Cyclobenzaprine Hcl) .... One By Mouth Q Hs 13)  Vitamin D 2000 Unit Tabs (Cholecalciferol) .... Once Daily 14)  Etodolac 400 Mg Tabs (Etodolac) .Marland Kitchen.. 1 By Mouth Two Times A Day 15)  Pa Resveratrol 250 Mg Caps (Resveratrol) .Marland Kitchen.. 1 By Mouth Once Daily 16)  Aspirin 81 Mg Tbec (Aspirin) .... Take One Tablet By Mouth Daily 17)  Coumadin 5 Mg Tabs (Warfarin Sodium) .Marland Kitchen.. 1 Once Daily M,w,f,su--1/2 Tue,thur, and Sat  Current Medications (verified): 1)  Synthroid 100 Mcg  Tabs (Levothyroxine Sodium) .... Take 1 Tablet By Mouth Once A Day 2)  Cleocin-T 1 % Lotn (Clindamycin Phosphate) .... Apply As Directed 3)  Caltrate 600+d   Tabs (Calcium Carbonate-Vitamin D Tabs) .... Take 1 Tablet By Mouth Two Times A Day 4)  Folbee 2.5-25-1 Mg Tabs (Folic Acid-Vit B6-Vit B12) .... One By Mouth Daily 5)  Fish Oil   Caps (Omega-3 Fatty Acids Caps) .... Take 1 By Mouth Two Times A Day Qd 6)  Prilosec 20 Mg  Cpdr (Omeprazole) .... One By Mouth Q Od 7)  Metoprolol Succinate 100 Mg Xr24h-Tab (Metoprolol Succinate) .Marland Kitchen.. 1 1/2 Tab Qd 8)  Glucosamine-Chondroitin 500-400 Mg  Caps (Glucosamine-Chondroitin) .... Two Times A Day 9)  Vitamin C 500 Mg  Tabs (Ascorbic Acid) .Marland Kitchen.. 1 Tab Once Daily 10)  Vitamin A .Marland Kitchen.. 1 Tab Once Daily 11)  Multi V .... 1 Tab Once Daily 12)  Cyclobenzaprine Hcl 5 Mg Tabs (Cyclobenzaprine Hcl) .... One By Mouth Q Hs 13)  Vitamin D 2000 Unit Tabs (Cholecalciferol) .... Once Daily 14)  Etodolac 400 Mg Tabs (Etodolac) .Marland Kitchen.. 1 By Mouth Two Times A Day 15)  Pa  Resveratrol 250 Mg Caps (Resveratrol) .Marland Kitchen.. 1 By Mouth Once Daily 16)  Aspirin 81 Mg Tbec (Aspirin) .... Take One Tablet By Mouth Daily 17)  Coumadin 5 Mg Tabs (Warfarin Sodium) .Marland Kitchen.. 1 Once Daily M,w,f,su--1/2 Tue,thur, and Sat 18)  Viviscal Hair Supplment .Marland Kitchen.. 1 Two Times A Day  Allergies (verified): 1)  ! Vioxx 2)  ! * Voltaren Eye Gtts. and Gel  Past History:  Family History: Last updated: 09/05/2009 Family History of Stroke F 1st degree relative <60 Family History of Colon Cancer: Father had colon surgery for cancer  Social History: Last updated: 09/05/2009 Married Former Smoker Alcohol Use - yes wine daily Illicit Drug Use - no Occupation:  retired  Risk Factors: Smoking Status: quit (03/26/2010)  Past medical, surgical, family and social histories (including risk factors) reviewed, and no changes noted (except as noted below).  Past Medical History: Reviewed history from 10/22/2009 and no changes required. Atrial fibrillation Osteoarthritis HRT-ovarian MVP Hypothyroidism Osteoporosis 1. Myxomatous degeneration of the mitral valve, status post mitral     valve repair with a good result and now stable. 2. Postoperative atrial fibrillation with no evidence of recurrence.  Hemorrhoids Adenomatous Colon Polyps 04/1986 Collagenous colitis  Past Surgical History: Reviewed history from 05/31/2008 and no changes required. Cholecystectomy Lumbar fusion Tonsillectomy GYN surgery  D andC  Cataract extraction mitral valve repair  2008  Family History: Reviewed history from 09/05/2009 and no changes required. Family History of Stroke F 1st degree relative <60 Family History of Colon Cancer: Father had colon surgery for cancer  Social History: Reviewed history from 09/05/2009 and no changes required. Married Former Smoker Alcohol Use - yes wine daily Illicit Drug Use - no Occupation:  retired  Review of Systems  The patient denies anorexia, fever, weight loss,  weight gain, vision loss, decreased hearing, hoarseness, chest pain, syncope, dyspnea on exertion, peripheral edema, prolonged cough, headaches, hemoptysis, abdominal pain, melena, hematochezia, severe indigestion/heartburn, hematuria, incontinence, genital sores, muscle weakness, suspicious skin lesions, transient blindness, difficulty walking, depression, unusual weight change, abnormal bleeding, enlarged lymph nodes, angioedema, and breast masses.         severe OA  Physical Exam  General:  alert and pale.   Head:  normocephalic and atraumatic.   Eyes:  pupils equal and pupils round.   Ears:  R ear normal and L ear normal.   Nose:  no external deformity and no nasal discharge.   Mouth:  good dentition and pharynx pink and moist.   Neck:  supple.  full ROM.   Lungs:  normal respiratory effort and no wheezes.   Abdomen:  soft and non-tender.   Msk:  severe MCP deformitiesjoint tenderness and joint swelling.   Neurologic:  alert & oriented X3 and gait normal.     Impression & Recommendations:  Problem # 1:  COUMADIN THERAPY (ICD-V58.61) Assessment Unchanged reviewed PT  PT in 2 weeks  Problem # 2:  ATRIAL FIBRILLATION, PAROXYSMAL (ICD-427.31) Assessment: Unchanged in SR today Her updated medication list for this problem includes:    Metoprolol Succinate 100 Mg Xr24h-tab (Metoprolol succinate) .Marland Kitchen... 1 1/2 tab qd    Aspirin 81 Mg Tbec (Aspirin) .Marland Kitchen... Take one tablet by mouth daily    Coumadin 5 Mg Tabs (Warfarin sodium) .Marland Kitchen... 1 once daily m,w,f,su--1/2 tue,thur, and sat  Reviewed the following: INR: 2.2 (03/04/2010) Coumadin Dose (weekly): 27.50 mg (01/07/2010) Prior Coumadin Dose (weekly): 27.50 mg (01/07/2010) Next Protime: 4 weeks (dated on 03/04/2010)  Problem # 3:  GASTROESOPHAGEAL REFLUX DISEASE, MILD (ICD-530.81) Assessment: Unchanged  Her updated medication list for this problem includes:    Prilosec 20 Mg Cpdr (Omeprazole) ..... One by mouth q od  Labs  Reviewed: Hgb: 12.9 (12/19/2009)   Hct: 38.1 (12/19/2009)  Problem # 4:  OSTEOARTHRITIS, HANDS, BILATERAL (ICD-715.94) Assessment: Deteriorated  Her updated medication list for this problem includes:    Etodolac 400 Mg Tabs (Etodolac) .Marland Kitchen... 1 by mouth two times a day    Aspirin 81 Mg Tbec (Aspirin) .Marland Kitchen... Take one tablet by mouth daily  Discussed use of medications, application of heat or cold, and exercises.   Complete Medication List: 1)  Synthroid 100 Mcg Tabs (Levothyroxine sodium) .... Take 1 tablet by mouth once a day 2)  Cleocin-t 1 % Lotn (Clindamycin phosphate) .... Apply as directed 3)  Caltrate 600+d Tabs (Calcium carbonate-vitamin d tabs) .... Take  1 tablet by mouth two times a day 4)  Folbee 2.5-25-1 Mg Tabs (Folic acid-vit b6-vit b12) .... One by mouth daily 5)  Fish Oil Caps (Omega-3 fatty acids caps) .... Take 1 by mouth two times a day qd 6)  Prilosec 20 Mg Cpdr (Omeprazole) .... One by mouth q od 7)  Metoprolol Succinate 100 Mg Xr24h-tab (Metoprolol succinate) .Marland Kitchen.. 1 1/2 tab qd 8)  Glucosamine-chondroitin 500-400 Mg Caps (Glucosamine-chondroitin) .... Two times a day 9)  Vitamin C 500 Mg Tabs (Ascorbic acid) .Marland Kitchen.. 1 tab once daily 10)  Vitamin A  .Marland Kitchen.. 1 tab once daily 11)  Multi V  .... 1 tab once daily 12)  Cyclobenzaprine Hcl 5 Mg Tabs (Cyclobenzaprine hcl) .... One by mouth q hs 13)  Vitamin D 2000 Unit Tabs (Cholecalciferol) .... Once daily 14)  Etodolac 400 Mg Tabs (Etodolac) .Marland Kitchen.. 1 by mouth two times a day 15)  Pa Resveratrol 250 Mg Caps (Resveratrol) .Marland Kitchen.. 1 by mouth once daily 16)  Aspirin 81 Mg Tbec (Aspirin) .... Take one tablet by mouth daily 17)  Coumadin 5 Mg Tabs (Warfarin sodium) .Marland Kitchen.. 1 once daily m,w,f,su--1/2 tue,thur, and sat 18)  Viviscal Hair Supplment  .Marland Kitchen.. 1 two times a day  Hypertension Assessment/Plan:      The patient's hypertensive risk group is category B: At least one risk factor (excluding diabetes) with no target organ damage.  Today's  blood pressure is 122/70.  Her blood pressure goal is < 140/90.  Patient Instructions: 1)  Please schedule a follow-up appointment in 5  months.   Orders Added: 1)  Est. Patient Level IV [04540]

## 2010-04-18 NOTE — Assessment & Plan Note (Signed)
Summary: PT // RS  Nurse Visit   Allergies: 1)  ! Vioxx 2)  ! * Voltaren Eye Gtts. and Gel Laboratory Results   Blood Tests      INR: 2.2   (Normal Range: 0.88-1.12   Therap INR: 2.0-3.5) Comments: Rita Ohara  March 04, 2010 2:57 PM     Orders Added: 1)  Est. Patient Level I [99211]   ANTICOAGULATION RECORD PREVIOUS REGIMEN & LAB RESULTS Anticoagulation Diagnosis:  Atrial Fibrillation on  12/03/2009 Previous INR Goal Range:  2.0-3.0 on  12/03/2009 Previous INR:  3.1 on  02/04/2010 Previous Coumadin Dose(mg):  2.5mg  on tue,thu & sat 5mg  on other days on  02/04/2010 Previous Regimen:  Same Dose on  02/04/2010  NEW REGIMEN & LAB RESULTS Current INR: 2.2 Regimen: same  Repeat testing in: 4 weeks  Anticoagulation Visit Questionnaire Coumadin dose missed/changed:  No Abnormal Bleeding Symptoms:  No  Any diet changes including alcohol intake, vegetables or greens since the last visit:  No Any illnesses or hospitalizations since the last visit:  No Any signs of clotting since the last visit (including chest discomfort, dizziness, shortness of breath, arm tingling, slurred speech, swelling or redness in leg):  No  MEDICATIONS SYNTHROID 100 MCG  TABS (LEVOTHYROXINE SODIUM) Take 1 tablet by mouth once a day CLEOCIN-T 1 % LOTN (CLINDAMYCIN PHOSPHATE) apply as directed CALTRATE 600+D   TABS (CALCIUM CARBONATE-VITAMIN D TABS) Take 1 tablet by mouth two times a day FOLBEE 2.5-25-1 MG TABS (FOLIC ACID-VIT B6-VIT B12) one by mouth daily FISH OIL   CAPS (OMEGA-3 FATTY ACIDS CAPS) Take 1 by mouth two times a day qd PRILOSEC 20 MG  CPDR (OMEPRAZOLE) one by mouth q od METOPROLOL SUCCINATE 100 MG XR24H-TAB (METOPROLOL SUCCINATE) 1 1/2 TAB QD GLUCOSAMINE-CHONDROITIN 500-400 MG  CAPS (GLUCOSAMINE-CHONDROITIN) two times a day VITAMIN C 500 MG  TABS (ASCORBIC ACID) 1 tab once daily * VITAMIN A 1 tab once daily * MULTI V 1 tab once daily CYCLOBENZAPRINE HCL 5 MG TABS  (CYCLOBENZAPRINE HCL) ONE by mouth Q hs VITAMIN D 2000 UNIT TABS (CHOLECALCIFEROL) once daily ETODOLAC 400 MG TABS (ETODOLAC) 1 by mouth two times a day PA RESVERATROL 250 MG CAPS (RESVERATROL) 1 by mouth once daily ASPIRIN 81 MG TBEC (ASPIRIN) Take one tablet by mouth daily COUMADIN 5 MG TABS (WARFARIN SODIUM) 1 once daily m,w,f,su--1/2 tue,thur, and sat

## 2010-04-18 NOTE — Progress Notes (Signed)
Summary: Pt req script for Coumadin 5mg  to [facility]   Phone Note Refill Request Call back at Home Phone [phone]   Refills Requested: Medication #1:  COUMADIN 5 MG TABS 1 once daily m   Dosage confirmed as above?Dosage Confirmed Pls call this in to [facility] pharmacy.      Method Requested: Telephone to [facility] Initial call taken by: Lucy Antigua,  February 25, 2010 9:38 AM    Prescriptions: COUMADIN 5 MG TABS (WARFARIN SODIUM) 1 once daily m,w,f,su--1/2 tue,thur, and sat  #100 x 3   Entered by:   Willy Eddy, LPN   Authorized by:   Stacie Glaze MD   Signed by:   Willy Eddy, LPN on 09/81/1914   Method used:   Electronically to        [facility]* (retail)             ,          Ph: [phone]       Fax: [phone]   RxID:   [id number]

## 2010-04-18 NOTE — Assessment & Plan Note (Signed)
Summary: PT/NJR  Nurse Visit   Allergies: 1)  ! Vioxx 2)  ! * Voltaren Eye Gtts. and Gel Laboratory Results   Blood Tests      INR: 2.2   (Normal Range: 0.88-1.12   Therap INR: 2.0-3.5) Comments: Rita Ohara  April 08, 2010 1:54 PM     Orders Added: 1)  Est. Patient Level I [99211] 2)  Protime [[id number]]   ANTICOAGULATION RECORD PREVIOUS REGIMEN & LAB RESULTS Anticoagulation Diagnosis:  Atrial Fibrillation on  12/03/2009 Previous INR Goal Range:  2.0-3.0 on  12/03/2009 Previous INR:  2.2 on  03/04/2010 Previous Coumadin Dose(mg):  2.5mg  on tue,thu & sat 5mg  on other days on  02/04/2010 Previous Regimen:  same on  03/04/2010  NEW REGIMEN & LAB RESULTS Current INR: 2.2 Regimen: same  Repeat testing in: 4 weeks  Anticoagulation Visit Questionnaire Coumadin dose missed/changed:  No Abnormal Bleeding Symptoms:  No  Any diet changes including alcohol intake, vegetables or greens since the last visit:  No Any illnesses or hospitalizations since the last visit:  No Any signs of clotting since the last visit (including chest discomfort, dizziness, shortness of breath, arm tingling, slurred speech, swelling or redness in leg):  No  MEDICATIONS SYNTHROID 100 MCG  TABS (LEVOTHYROXINE SODIUM) Take 1 tablet by mouth once a day CLEOCIN-T 1 % LOTN (CLINDAMYCIN PHOSPHATE) apply as directed CALTRATE 600+D   TABS (CALCIUM CARBONATE-VITAMIN D TABS) Take 1 tablet by mouth two times a day FOLBEE 2.5-25-1 MG TABS (FOLIC ACID-VIT B6-VIT B12) one by mouth daily FISH OIL   CAPS (OMEGA-3 FATTY ACIDS CAPS) Take 1 by mouth two times a day qd PRILOSEC 20 MG  CPDR (OMEPRAZOLE) one by mouth q od METOPROLOL SUCCINATE 100 MG XR24H-TAB (METOPROLOL SUCCINATE) 1 1/2 TAB QD GLUCOSAMINE-CHONDROITIN 500-400 MG  CAPS (GLUCOSAMINE-CHONDROITIN) two times a day VITAMIN C 500 MG  TABS (ASCORBIC ACID) 1 tab once daily * VITAMIN A 1 tab once daily * MULTI V 1 tab once daily CYCLOBENZAPRINE HCL 5 MG  TABS (CYCLOBENZAPRINE HCL) ONE by mouth Q hs VITAMIN D 2000 UNIT TABS (CHOLECALCIFEROL) once daily ETODOLAC 400 MG TABS (ETODOLAC) 1 by mouth two times a day PA RESVERATROL 250 MG CAPS (RESVERATROL) 1 by mouth once daily ASPIRIN 81 MG TBEC (ASPIRIN) Take one tablet by mouth daily COUMADIN 5 MG TABS (WARFARIN SODIUM) 1 once daily m,w,f,su--1/2 tue,thur, and sat * VIVISCAL HAIR SUPPLMENT 1 two times a day

## 2010-05-06 ENCOUNTER — Other Ambulatory Visit: Payer: Medicare Other | Admitting: Internal Medicine

## 2010-05-06 DIAGNOSIS — I4891 Unspecified atrial fibrillation: Secondary | ICD-10-CM

## 2010-05-15 ENCOUNTER — Other Ambulatory Visit: Payer: Self-pay | Admitting: Internal Medicine

## 2010-05-25 ENCOUNTER — Encounter: Payer: Self-pay | Admitting: Cardiovascular Disease

## 2010-06-02 LAB — COMPREHENSIVE METABOLIC PANEL
AST: 23 U/L (ref 0–37)
Albumin: 3.6 g/dL (ref 3.5–5.2)
BUN: 28 mg/dL — ABNORMAL HIGH (ref 6–23)
Calcium: 8.9 mg/dL (ref 8.4–10.5)
Creatinine, Ser: 0.95 mg/dL (ref 0.4–1.2)
GFR calc Af Amer: >60 mL/min (ref >60)
Total Bilirubin: 0.8 mg/dL (ref 0.3–1.2)
Total Protein: 6 g/dL (ref 6.0–8.3)

## 2010-06-02 LAB — CBC
MCH: 32.7 pg (ref 26.0–34.0)
MCHC: 33.1 g/dL (ref 30.0–36.0)
MCV: 98.7 fL (ref 78.0–100.0)
Platelets: 254 10*3/uL (ref 150–400)
RBC: 3.91 MIL/uL (ref 3.87–5.11)
RDW: 14.3 % (ref 11.5–15.5)

## 2010-06-02 LAB — DIFFERENTIAL
Basophils Absolute: 0 10*3/uL (ref 0.0–0.1)
Eosinophils Relative: 3 % (ref 0–5)
Lymphocytes Relative: 21 % (ref 12–46)
Lymphs Abs: 1.1 10*3/uL (ref 0.7–4.0)
Monocytes Absolute: 0.5 10*3/uL (ref 0.1–1.0)
Neutro Abs: 3.3 10*3/uL (ref 1.7–7.7)

## 2010-06-02 LAB — POCT CARDIAC MARKERS
CKMB, poc: <1 ng/mL — ABNORMAL LOW (ref 1.0–8.0)
Myoglobin, poc: 47.5 ng/mL (ref 12–200)

## 2010-06-04 ENCOUNTER — Other Ambulatory Visit: Payer: Medicare Other

## 2010-06-04 DIAGNOSIS — I4891 Unspecified atrial fibrillation: Secondary | ICD-10-CM

## 2010-06-04 NOTE — Patient Instructions (Signed)
Same dose 

## 2010-06-07 ENCOUNTER — Encounter: Payer: Self-pay | Admitting: Cardiovascular Disease

## 2010-06-07 ENCOUNTER — Ambulatory Visit (INDEPENDENT_AMBULATORY_CARE_PROVIDER_SITE_OTHER): Payer: Medicare Other | Admitting: Cardiovascular Disease

## 2010-06-07 DIAGNOSIS — I4891 Unspecified atrial fibrillation: Secondary | ICD-10-CM

## 2010-06-07 DIAGNOSIS — I059 Rheumatic mitral valve disease, unspecified: Secondary | ICD-10-CM

## 2010-06-07 NOTE — Progress Notes (Signed)
The patient is 77 years old and returns for management of valvular heart disease and atrial fibrillation. In 2008 she had mitral valve repair at [facility] for mitral regurgitation. Postoperatively she had atrial fibrillation which resolved. She did have one history of atrial fibrillation in the remote past prior to this. She did well after that. On September 24, 2009 she had an episode of atrial fibrillation with a fast heart rate which was asymptomatic and which was noted in the GI clinic. She went to the emergency department and converted with diltiazem and was sent home on diltiazem in addition to her metoprolol.  She was started on coumadin then.  She has had Rx INR's with no difficulty.  She has been observing SBE prophylaxis. She also has collagenous colitis which is managed by Dr. Russella Dar.  Reviewed last echo 7/11 with mild to moderate MR through repair and normal EF  ROS: Denies fever, malais, weight loss, blurry vision, decreased visual acuity, cough, sputum, SOB, hemoptysis, pleuritic pain, palpitaitons, heartburn, abdominal pain, melena, lower extremity edema, claudication, or rash.   General: Affect appropriate Healthy:  appears stated age HEENT: normal Neck supple with no adenopathy JVP normal no bruits no thyromegaly Lungs clear with no wheezing and good diaphragmatic motion Heart:  S1/S2 Mr murmur no rub, gallop or click PMI normal Abdomen: benighn, BS positve, no tenderness, no AAA no bruit.  No HSM or HJR Distal pulses intact with no bruits No edema Neuro non-focal Skin warm and dry No muscular weakness   Current Outpatient Prescriptions  Medication Sig Dispense Refill  . Ascorbic Acid (VITAMIN C) 500 MG tablet Take 500 mg by mouth daily.        Marland Kitchen aspirin 81 MG EC tablet Take 81 mg by mouth daily.        . Calcium Carbonate-Vitamin D (CALTRATE 600+D) 600-400 MG-UNIT per tablet Take 1 tablet by mouth 2 (two) times daily.        . Cholecalciferol (VITAMIN D) 2000 UNITS  CAPS Take 1 capsule by mouth daily.        . clindamycin (CLEOCIN T) 1 % lotion APPLY AS DIRECTED  60 mL  5  . cyclobenzaprine (FLEXERIL) 5 MG tablet Take 5 mg by mouth at bedtime.        Marland Kitchen etodolac (LODINE) 400 MG tablet Take 400 mg by mouth 2 (two) times daily.        . fish oil-omega-3 fatty acids 1000 MG capsule 2 tabs po qd      . Folic Acid-Vit B6-Vit B12 (FOLBEE) 2.5-25-1 MG TABS Take 1 tablet by mouth daily.        Marland Kitchen glucosamine-chondroitin 500-400 MG tablet Take 1 tablet by mouth 2 (two) times daily.        Marland Kitchen levothyroxine (SYNTHROID, LEVOTHROID) 100 MCG tablet Take 100 mcg by mouth daily.        . metoprolol (TOPROL-XL) 100 MG 24 hr tablet Take 100 mg by mouth daily. TAKE 1 AND 1/2 TABS QD       . Multiple Vitamin (MULTIVITAMIN) tablet Take 1 tablet by mouth daily.        Marland Kitchen omeprazole (PRILOSEC) 20 MG capsule Take 20 mg by mouth daily.        Marland Kitchen Resveratrol 250 MG CAPS Take 1 capsule by mouth daily.        . vitamin A 7500 UNIT capsule Take 7,500 Units by mouth daily.        Marland Kitchen warfarin (COUMADIN) 5 MG  tablet Take 5 mg by mouth as directed.          Allergies  Rofecoxib   Assessment and Plan

## 2010-06-07 NOTE — Assessment & Plan Note (Signed)
S/P repair [facility] 2008  Echo 10/11/09 mild to moderate MR normal EF  F/U echo 7/12

## 2010-06-07 NOTE — Patient Instructions (Signed)
Your physician recommends that you schedule a follow-up appointment in: July with an ultasound

## 2010-06-07 NOTE — Assessment & Plan Note (Signed)
Continue anticoagulation F/U clinic.  Rate control is fine

## 2010-07-04 ENCOUNTER — Ambulatory Visit (INDEPENDENT_AMBULATORY_CARE_PROVIDER_SITE_OTHER): Payer: Medicare Other | Admitting: Internal Medicine

## 2010-07-04 DIAGNOSIS — I4891 Unspecified atrial fibrillation: Secondary | ICD-10-CM

## 2010-07-04 NOTE — Patient Instructions (Signed)
Same dose of coumadin 

## 2010-07-30 NOTE — Assessment & Plan Note (Signed)
[facility]CARE                            CARDIOLOGY OFFICE NOTE   Theresa, [name]                      MRN:          [id number]  DATE:05/25/2007                            DOB:          Nov 11, 1931    PRIMARY CARE PHYSICIAN:  Dr. Darryll Capers.   CLINICAL HISTORY:  Theresa [name] returned for follow-up management of  her valvular heart disease.  She developed severe mitral regurgitation  related to myxomatous degeneration of her mitral valve and underwent  mitral valve repair by Dr. Silvestre Mesi at [facility] in March 2008.  She  did quite well.  She did have postoperative atrial fibrillation which  resolved but she was temporarily on sotalol and Coumadin.   She has had no recent symptoms of shortness of breath, palpitations or  chest pain.   PAST MEDICAL HISTORY:  Significant for  1. Hypothyroidism which is treated.  2. She also has GERD.   CURRENT MEDICATIONS:  1. Levothyroxine.  2. Metoprolol 50 mg in the morning and 25 mg in the evening.  This was      just increased by Dr. Lovell Sheehan because of fast heart rate.  3. She also takes Premarin, aspirin.   PHYSICAL EXAMINATION:  VITAL SIGNS:  Blood pressure was 122/72 and the  pulse 74 and regular.  NECK:  There was no venous distension.  The carotid pulses were full  without bruits.  CHEST:  Clear without rales or rhonchi.  CARDIAC:  Rhythm was regular.  Heart sounds were normal.  S2 was  possibly slightly accentuated.  I could hear no murmur.  There was no  gallop.  ABDOMEN:  Soft with normal bowel sounds.  There was no  hepatosplenomegaly.  EXTREMITIES:  Distal pulses were full with no peripheral edema.   We did an echocardiogram prior to visit today and she had a myxomatous  redundancy of the mitral valve involving the anterior leaflet and there  was trace mitral regurgitation only with a gradient of 3 mm.  She had  mild dilatation of the left atrium and her left ventricular end-  diastolic dimension had decreased to 41.   IMPRESSION:  1. Myxomatous degeneration of the mitral valve, status post mitral      valve repair with a good result and now stable.  2. Postoperative atrial fibrillation with no evidence of recurrence.   RECOMMENDATIONS:  I think Ms. Rehm is doing quite well.  We will not  make any change in her medication.  I spoke with Dr. Silvestre Mesi last year  and he did recommend subacute bacterial endocarditis prophylaxis  although he acknowledged this was at some deviance from the standard  guideline, so we will continue that recommendation.  I will plan to see  her back in a year, will do an echo on her before that visit.  We may  need to do echoes every year after that.     Bruce Elvera Lennox Juanda Chance, MD, [facility]  Electronically Signed    BRB/MedQ  DD: 05/25/2007  DT: 05/26/2007  Job #: [id number]

## 2010-07-30 NOTE — Letter (Signed)
November 06, 2006    Brion Aliment. Glower, MD  Division of Cardiovascular and Thoracic Surgery  [facility] [address]  [place], [place] [address]  FAX: [phone]   RE:  Theresa, [name]  MRN:  [id number]  /  DOB:  June 27, 1931   Dear Roe Coombs:   I saw Theresa [name] back for followup visit today. She is doing  extremely well and has no heart murmur and is back to all her regular  activities.   She mentioned to you that you would recommend that she have SBE  prophylaxis realizing that this was a deviation from the revised  guidelines. Just wanted to make sure that we were together on this. If  you could please let us know what your formal recommendations is  regarding SBE prophylaxis for Theresa [name] after her mitral valve  repair.   Hope you are doing well.    Sincerely,      Bruce R. Juanda Chance, MD, [facility]  Electronically Signed    BRB/MedQ  DD: 11/06/2006  DT: 11/07/2006  Job #: [id number]

## 2010-07-30 NOTE — Assessment & Plan Note (Signed)
[facility]ALTH[facility]                            CARDIOLOGY OFFICE NOTE   Theresa [name], Theresa [name]                      MRN:          [id number]  DATE:08/12/2006                            DOB:          Dec 14, 1931    PRIMARY CARE PHYSICIAN:  Dr. Darryll Capers.   CLINICAL HISTORY:  Theresa [name] returned for her followup visit after  her recent mitral valve surgery at [facility] by Dr. Silvestre Mesi. She  had developed mitral regurgitation with symptoms of dyspnea on exertion  and was evaluated with an echocardiogram, a transesophageal  echocardiogram, and CT angiography. We referred her to Dr. Silvestre Mesi at  [facility] and she underwent noninvasive repair of her mitral  valve. She did well and has gradually increased her activities. She  developed some right-sided chest pain last week and then a rash, and Dr.  Cato Mulligan made a diagnosis of shingles.   She and her husband are now back to walking their 2 miles a day. She has  had LV function.   PAST MEDICAL HISTORY:  Significant for history of paroxysmal atrial  fibrillation and GERD.   Her current medications include; thyroid, Lodine, Fosamax, Prilosec,  sotalol, Lasix, potassium, chloride.   On examination, the blood pressure is 120/74, and the pulse 100 and  regular.  There was no venous distention. The carotid pulses were full without  bruits.  CHEST: Clear.  CARDIAC: Rhythm was regular. The heart sounds were normal and I could  hear no murmur.  ABDOMEN: Soft with normal bowel sounds. There is no hepatosplenomegaly.  Peripheral pulses were full. There is no peripheral edema.   An ECG was normal with a heart rate of 100.   IMPRESSION:  1. Two months status post mitral valve repair for severe mitral      regurgitation.  2. Good left ventricular function.  3. History of paroxysmal atrial fibrillation.  4. Gastroesophageal reflux disease.   RECOMMENDATIONS:  I think Theresa [name] is doing quite well. We will  get  a repeat CBC to see if her hemoglobin has normalized. We will add Toprol  25 because of her relative  tachycardia. I will plan to see her back in follow-up in 3 months. My  inclination is to keep her on sotalol longterm since she does have a  history of paroxysmal atrial fibrillation.     Bruce Elvera Lennox Juanda Chance, MD, [facility]  Electronically Signed    BRB/MedQ  DD: 08/12/2006  DT: 08/12/2006  Job #: [id number]   cc:   Theresa [name]

## 2010-07-30 NOTE — Assessment & Plan Note (Signed)
[name]                            CARDIOLOGY OFFICE NOTE   BLESS, LISENBY                      MRN:          [id number]  DATE:11/06/2006                            DOB:          10-25-1931    PRIMARY CARE PHYSICIAN:  Theresa Glaze, MD   CLINICAL HISTORY:  Theresa [name] was back for followup management of valvular  heart disease. She had developed severe mitral regurgitation and  underwent mitral valve repair by Dr.  Silvestre [name] at [facility] on March  31 of this year. The surgery went well. She had perioperative atrial  fibrillation, which resolved and she was discharged on sotalol.  According to [name], Dr.  Silvestre [name] had also recommended SBE  prophylaxis which he acknowledged was deviation from our standard  guidelines.   She has felt quite well and has had no recurrent chest pain, shortness  of breath, or palpitations.   PAST MEDICAL HISTORY:  1. Hypothyroidism, which has been treated. She recently was put on a      new medication and her TSH level was less than 0.03 and Dr.      Lovell [name] has reduced her medication and has a followup pending.  2. Gastroesophageal reflux disease.   CURRENT MEDICATIONS:  1. Sotalol.  2. Lasix.  3. Potassium.  4. Toprol.  5. Levothyroxine.   PHYSICAL EXAMINATION:  Blood pressure was 113/69, pulse 86 and regular.  There was no venous distention. The carotid pulses were full without  bruits.  CHEST: Was clear without rales or rhonchi.  CARDIAC: Rhythm was regular. I can hear no murmurs or gallops.  ABDOMEN: Was soft with normal bowel sounds. There was no  hepatosplenomegaly.  The peripheral pulses were full and there was no peripheral edema.   IMPRESSION:  1. Status post mitral valve repair for severe mitral regurgitation      March 2009.  2. Good left ventricular function.  3. Perioperative paroxysmal atrial fibrillation.  4. Gastroesophageal reflux disease.  5. Iatrogenic hyperthyroidism.   RECOMMENDATIONS:  I think Theresa [name] is doing very well. Her atrial  fibrillation occurred related to her mitral regurgitation from surgery  so I am hopeful that she will not have recurrence and I think we can get  by without rhythm control medications. We have asked her to stop the  sotalol and increase her Toprol from 25 to 50 a day. Dr.  Lovell [name] has  already adjusted the thyroid medication and plans to followup on this. I  will plan to see her back the year anniversary of her surgery which will  be March of 2009. Will plan to do a 2D echocardiogram prior to that  visit. Will also plan to stop her Lasix and potassium. I will  communicate with Dr.  Silvestre [name] regarding the need for SBE prophylaxis.     Bruce Elvera Lennox Juanda Chance, MD, [facility]  Electronically Signed    BRB/MedQ  DD: 11/06/2006  DT: 11/07/2006  Job #: [id number]   cc:   Theresa Glaze, MD

## 2010-08-02 NOTE — Discharge Summary (Signed)
NAMECOOPER, STAMP               ACCOUNT NO.:  [id number]   MEDICAL RECORD NO.:  [id number]          PATIENT TYPE:  INP   LOCATION:  5006                         FACILITY:  [facility]   PHYSICIAN:  Sharolyn Douglas, M.D.        DATE OF BIRTH:  07/05/1931   DATE OF ADMISSION:  11/06/2004  DATE OF DISCHARGE:  11/10/2004                                 DISCHARGE SUMMARY   ADMITTING DIAGNOSES:  1.  Degenerative scoliosis and spinal stenosis L2-S1.  2.  Hypothyroidism.  3.  Hypertension.  4.  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  5.  Osteoporosis.  6.  Hypokalemia.   DISCHARGE DIAGNOSES:  1.  Status post L3-S1 posterior spinal fusion.  2.  Postoperative anemia requiring transfusion.  3.  Postoperative hyperglycemia.  4.  Postoperative hypokalemia.   PROCEDURE:  On November 06, 2004, the patient was taken to the operating room  for an L3-S1 posterior spinal fusion with pedicle screws as well as an L4-L5  and L5-S1 PLIF. The surgeon was Sharolyn Douglas, MD. Assistant: Verlin Fester,  PAC. Anesthesia used was general.   CONSULTS:  None.   LABS:  Preoperative CBC with diff had a white count of 3.23, hematocrit of  35.2, MCV of 109.1, RBW % of 15.9, otherwise, within normal limits.  Postoperatively, on postop day 1, the patient's hemoglobin was 8.9,  hematocrit of 25.5. The following day after transfusion, increased up to  10.8 and 31.2 and then back to 8.6 and increased up to 11.7 and 33.5. PT,  INR and PTT preoperatively within normal limits. Complete metabolic panel  preoperatively within normal limits with the exception of glucose of 101 and  an AST of 42. Postoperatively, basic metabolic panel was monitored x 3 days.  She did have an elevated glucose ranging from 112-160 through those 3 days  and one episode of decreased potassium to 3.3 on November 08, 2004 and calcium  was decreased throughout her postoperative course ranging from 7.8 to 8.1.  UA preoperatively was negative. Blood typing on August 18 was  type O, Rh  type positive, antibody screen negative. EKG from November 01, 2004 shows  normal sinus rhythm, left ventricular hypertrophy, no uptracing, read by  Chanda Busing. X-rays on August 23: Portal spinal fusion intraoperatively  for localization and to confirm screw placement L3-S1. August 27: X-rays  lumbar previews again showed L3-S1 fusion in good position. No complication  features.   BRIEF HISTORY:  The patient is a 77 year old female who has had longstanding  problems with her back that have been progressively getting worse over the  years. The pain has gotten to this point it is interfering with her  activities of daily living and she is suffering on a daily basis. She has  gotten to the point that her epidural steroid injections are not giving her  any lasting relief. Her activity modification and medications are not  helping her either. It was thought her best course of management because of  the progression of scoliosis and her significant dysfunction and disability  would be a posterior spinal fusion procedure  and decompression. Dr. Noel Gerold  discussed this with her at length the risks and benefits of this procedure  and she indicated understanding and opted to proceed with the surgery.   HOSPITAL COURSE:  On November 06, 2004, the patient was taken to the operating  room for the above procedure. She tolerated the procedure well without any  intraoperative complications and was transferred to the recovery room in  stable condition.   Routine orthopedic spine protocol was followed postoperatively and she did  relatively well and did not develop any significant postoperative  complications.   On postoperative day 1 she did have anemia of 8.9. She was mildly  symptomatic and therefore it was felt in the best interest to undergo a  transfusion. We did discuss this with her and she agreed and consented to  this. She was transfused 2 units of packed red blood cells and she did  very  well. I did not know of any complications with this. Her hemoglobin did  increase over the next couple of days as previously dictated.   Physical therapy worked with her on a daily basis on a progressive  ambulation program, brace use as well as back precautions and she did well  with them getting to the point that she was independent, safe and understood  all precautions prior to discharge.   She did develop mild postoperative hypokalemia on postoperative day 2 but  after advancing her diet this did resolve with no further intervention. The  patient's dressing was changed on postoperative day 2. Hemovac drain was  discontinued without difficulty and daily dressing changes were done until  the day of discharge. Incision continued to look good throughout her  hospital stay.   By November 10, 2004, the patient had met all orthopedic goals. She was stable  and ready for discharge from a medical standpoint as well.   DISCHARGE PLAN:  The patient is a 77 year old female status post posterior  spinal fusion.  Activity: Daily ambulation program, brace should be on when  she is up, back precautions at all times, no lifting heavier than 5 pounds,  daily dressing changes. Follow-up 2 weeks postoperatively in Dr. [facility]iles  clinic, call for an appointment.   MEDICATIONS ON DISCHARGE:  1.  Vicodin for pain.  2.  Robaxin for muscle pain.  3.  Multivitamin daily.  4.  Calcium 1200 daily.  5.  Laxative as needed.  6.  Colace 100 mg twice daily as needed.  7.  Resume home medications.   DIET:  Regular home diet.   CONDITION ON DISCHARGE:  Stable, improved.   DISPOSITION:  The patient will be discharged to her home with home health,  physical therapy with Genevieve Norlander and occupational therapy as well as her family  physician.      Verlin Fester, P.A.      Sharolyn Douglas, M.D.  Electronically Signed    CM/MEDQ  D:  02/26/2005  T:  02/27/2005  Job:  [id number]   cc:   Sharolyn Douglas, M.D. Fax:  [phone]

## 2010-08-02 NOTE — Op Note (Signed)
Theresa [name], Theresa [name]               ACCOUNT NO.:  [id number]   MEDICAL RECORD NO.:  [id number]          PATIENT TYPE:  AMB   LOCATION:  SDC                           FACILITY:  [facility]   PHYSICIAN:  Carrington Clamp, M.D. DATE OF BIRTH:  [birth year]   DATE OF PROCEDURE:  10/17/2005  DATE OF DISCHARGE:                                 OPERATIVE REPORT   PREOPERATIVE DIAGNOSIS:  Postmenopausal bleeding and thickened endometrium.   POSTOPERATIVE DIAGNOSIS:  Postmenopausal bleeding and thickened endometrium.   PROCEDURE:  Dilation and curettage with hysteroscopy.   SURGEON:  Carrington Clamp, M.D.   ASSISTANT:  None.   ANESTHESIA:  General.   SPECIMENS:  Endometrial curettings.   ESTIMATED BLOOD LOSS:  Minimal.   IV FLUIDS:  not excessive   URINE OUTPUT:  Not measured.   COMPLICATIONS:  None.   FINDINGS:  Atrophic endometrium without any masses or evidence of polyps or  fibroids.   COUNTS:  Correct x3.   MEDICATIONS:  Ampicillin preoperatively.   TECHNIQUE:  After adequate LMA anesthesia was achieved, the patient was  prepped and draped in sterile fashion in dorsal lithotomy position.  Speculum was placed in the vagina and it was noted that the cervix was  tethered posteriorly to the posterior vaginal wall but the cervical os was  able to be seen.  The bladder had been emptied with a red rubber catheter  and the cervix was then grasped with a single-tooth tenaculum.  A soft os  dilator was placed in the os to begin dilation.  The Pratt dilators then  were used to dilate the cervix up to a level with which the scope could be  placed in.  The uterus sounded to about 6.5 cm.  The scope was then placed  in the endometrial canal and the above findings noted.  The polyp forceps  was used to try and remove some tissue near the right cornu.  This was  followed by a sharp curettage with the curette.  It was uncertain if there  was a small polyp in the right cornu area as  the  endometrium appeared shaggy after this curetting.  Sharp curettage and  polyp forceps were used to remove as much tissue as possible though there  was not and overwhelming amount.  All instruments were then withdrawn from  the vagina and the patient returned to recovery room in stable condition.      Carrington Clamp, M.D.  Electronically Signed     MH/MEDQ  D:  10/17/2005  T:  10/17/2005  Job:  [id number]   cc:   Stacie Glaze, MD  [address] [place]  [place] [address]

## 2010-08-02 NOTE — Assessment & Plan Note (Signed)
[name]                            CARDIOLOGY OFFICE NOTE   Theresa [name], Theresa [name]                      MRN:          [id number]  DATE:03/30/2006                            DOB:          05-Nov-1931    Primary care physician and referring physician is Dr. Darryll Capers.   REASON FOR REFERRAL:  Heart murmur and worsening mitral regurgitation on  echocardiography.   CLINICAL HISTORY:  Theresa [name] is 77 years old.  She has a previous  history of mitral valve prolapse and mitral regurgitation.  She also has  a history of paroxysmal atrial fibrillation.  She has done quite well  since that time.  Several weeks ago, while walking up the hill, she  noticed increased shortness of breath which was much more pronounced  than usual.  She had 3 similar episodes of this, and had an  echocardiogram done on December 11th ordered by Dr. Lovell Sheehan, which  showed a thickening of the mitral valve with moderate prolapse of the  anterior leaflet, and severe mitral regurgitation with an eccentric jet  directed posteriorly.  The left atrium was moderately dilated.  This  represented a change from her previous echocardiogram performed in 2005  where the mitral regurgitation was only moderate.   She says that after these 3 episodes of shortness of breath, she has had  no other episodes of shortness of breath.  She has had no chest pain and  no palpitations.   PAST MEDICAL HISTORY:  Significant for previous gallbladder surgery and  lumbar spine surgery by Dr. Noel Gerold.  She has a past history of paroxysmal  atrial fibrillation.  There is no history of diabetes, hypertension, or  hyperlipidemia.   CURRENT MEDICATIONS:  1. Thyroid.  2. Metoprolol for her atrial fibrillation.  3. Minocycline.  4. Cleocin.  5. AcipHex.  6. Glucosamine.  7. Aspirin.  8. Fish oil.   SOCIAL HISTORY:  She has just been married for the first time 5 years  ago.  She has no children.  She  quit smoking about 40 years ago.   FAMILY HISTORY:  Her father died of probable dementia.  Her mother had a  stroke and died at age 23.   REVIEW OF SYSTEMS:  Positive for back pain related to her lumbosacral  spine disease.   PHYSICAL EXAMINATION:  Today the blood pressure was 140/70, pulse 65 and  regular (her normal blood pressure is 120-130).  There was no venous distension.  The carotid pulses were full and there  were no bruits.  CHEST:  Clear without rales or rhonchi.  CARDIAC:  Rhythm was regular.  First heart sound was normal.  Second  heart sound was nominally split, and the S2 was somewhat accentuated.  There was a grade 3/6 pansystolic murmur with late accentuation at the  apex and left sternal edge, running to the axial and to the base.  There  was no diastolic murmur, I could not hear a definite gallop.  ABDOMEN:  Soft with normal bowel sounds.  There was no  hepatosplenomegaly.  Peripheral pulses were full and  there was no  peripheral edema.  MUSCULOSKELETAL SYSTEM:  Showed no deformities.  SKIN:  Warm and dry.  NEUROLOGIC EXAMINATION:  Showed no focal or neurological signs.   Electrocardiogram was normal.   IMPRESSION:  1. Mitral valve prolapse with evidence of severe mitral regurgitation      by transthoracic echocardiography, worse than previous the previous      echo.  2. Symptoms of shortness of breath with exertion.  3. History of paroxysmal atrial fibrillation.  4. History of scleritis.  5. History of lumbosacral spine disease.   RECOMMENDATIONS:  Although Theresa [name] had 3 episodes of shortness of  breath, she says that, overall, now she is not having those symptoms,  and she feels like her exercise tolerance is similar to what it was 6  months ago.  Nevertheless, I think she should be evaluated further with  transesophageal echo, and we made arrangements for her to come in the  week after next for this with Dr. Jens Som.  I told her I would be in   touch by phone after we have the results of her echo.   ADDENDUM:  She has an unusual bluish discoloration around her eyes.  I  am certainly not sure what this is, and Dr. Lovell Sheehan is working her up  for autoimmune causes for this.     Bruce Elvera Lennox Juanda Chance, MD, [facility]  Electronically Signed    BRB/MedQ  DD: 03/30/2006  DT: 03/31/2006  Job #: [id number]   cc:   Stacie Glaze, MD

## 2010-08-02 NOTE — Op Note (Signed)
NAMEMADGIE, DHALIWAL               ACCOUNT NO.:  [id number]   MEDICAL RECORD NO.:  [id number]          PATIENT TYPE:  INP   LOCATION:  5006                         FACILITY:  [facility]   PHYSICIAN:  Sharolyn Douglas, M.D.        DATE OF BIRTH:  03/04/1932   DATE OF PROCEDURE:  11/06/2004  DATE OF DISCHARGE:                                 OPERATIVE REPORT   DIAGNOSIS:  1.  Degenerative lumbar scoliosis.  2.  Spinal stenosis.  3.  Intractable back and right greater than left leg pain.  4.  Rheumatoid arthritis steroid dependent.  5.  Osteoporosis.   PROCEDURE:  1.  L3-S1 lumbar laminectomy with wide decompression of the thecal sac and      nerve roots bilaterally.  2.  L3-S1 posterior spinal arthrodesis.  3.  Segmental pedicle screw instrumentation, L3-S1 using the [employer]      [employer] system.  4.  Transforaminal lumbar interbody fusion, L4-5 and L5-S1 with placement of      two PEEK cages.  5.  Local autogenous bone graft supplemented with Grafton allograft.  6.  Bone marrow aspiration.   SURGEON:  Sharolyn Douglas, MD.   ASSISTANT:  Verlin Fester, P.A.   ANESTHESIA:  General endotracheal.   COMPLICATIONS:  None.   INDICATIONS:  The patient is a very pleasant 77 year old female with  intractable back and right greater than left lower extremity pain. She has a  severe collapsing degenerative scoliosis with underlying spinal stenosis and  rotatory subluxations. She has failed to respond to conservative measures  and has elected to undergo lumbar decompression and fusion in hopes of  improving her symptoms. Risk and benefits were reviewed and she elected to  proceed.   PROCEDURE:  The patient was identified in the holding area, taken to the  operating room underwent general endotracheal anesthesia without difficulty  given prophylactic IV antibiotics. Carefully turned prone onto the Acromed  four-poster positioning frame. All bony prominences padded. Face and eyes  protected at all  times. Back prepped, draped usual sterile fashion. Midline  incision made from L2 down to the sacrum. Dissection was carried sharply  through the deep fascia. The paraspinal muscles were elevated out to the  tips of the transverse processes of L3, L4, L5 and sacral ala bilaterally.  The spine was very degenerative and the facette joints hypertrophied and  deformed from the scoliosis. On the right side the L3-4 and L4-5 joints were  subluxated and collapsed upon each other. Deep retractors were placed an  intraoperative x-ray taken to confirm location. We then completed a wide  laminectomy by removing the entire spinous process and lamina of L4 and L5.  We removed the inferior two thirds of the L3 lamina and spinous process  bearing the interspinous ligaments between L2 and L3 to try and prevent  adjacent segment instability. The laminectomy was completed by carrying out  the decompression flush with the pedicles. We found severe spinal stenosis  at L4-5 secondary to the rotatory subluxation and enlargement of the  ligamentum flavum and facette joints. Loupes and headlight  magnification  utilized to carefully remove the adherent ligamentum and facette cyst from  the thecal sac. The nerve roots were identified and decompressed. The L4  nerve root was being severely compressed within the L4-5 foramen secondary  to the rotatory subluxation and collapse of the L4-5 disk space. The L5-S1  foramen was also quite stenotic on the left side secondary to up down  stenosis and disk osteophyte complex within the foramen itself. We therefore  elected to complete transforaminal lumbar interbody fusions at L5-S1 on the  left side and L4-5 on the right side to further decompress the foramina. We  turned our attention to placing pedicle screws at L3, L4, L5 and S1  bilaterally. We utilized an anatomic probing technique. We could also  palpate the pedicles from within the spinal canal. 6.5 mm screws were  used  at L3, L4 and L5.  7.5 mm screws used in the sacrum. Considering the  patient's age and history of steroid use, the screw purchase was quite good.  Before placing the screws, bone marrow was aspirated from the vertebral  bodies using a fenestrated tap. This bone marrow was mixed with the local  bone graft which had been obtained from the laminectomy as well as with the  Grafton allograft. After placing the screws, they were stimulated with  triggered EMGs. The left L4 screw had a threshold of 6 and therefore that  screw was removed and the pedicle carefully palpated again. There may have  been a small breech medially secondary to the vertebral rotation at this  level. We therefore redirected the screw more laterally. The pedicle was  explored from within the spinal canal and there did not appear to be any  impingement of the neural structures. We then turned our attention to doing  the transforaminal lumbar interbody fusions. On the left side at L5-S1, the  transverse exiting nerve roots were identified.  Free running EMGs were  monitored. Sharp annulotomy completed. Radical diskectomy carried across to  the contralateral side using curved curettes. The disk space was distracted  up to 9 mm. The disk space was then packed with local bone graft and bone  marrow aspirate. A 9 mm PEEK cage was then packed with bone graft. The cage  was inserted into the interspace, carefully tamped anteriorly and across the  midline. We then reevaluated the L5-S1 foramen and found that it was widely  patent. We then completed a similar procedure on the right side at L4-5. At  this level there was a rotatory subluxation which partially reduced as we  distracted the interspace. We utilized a 7 mm spacer at this level. We  achieved excellent distraction of the foramen which was now widely patent.  There were no deleterious changes in the free running EMGs throughout the decompression or TLIF procedure. We  then completed the posterior arthrodesis  by decorticating the transverse processes of L3, L4, L5 and sacral ala  bilaterally. The remaining bone graft was packed into the lateral gutters.  We place 100 mm titanium rods which were bent into lordosis into the  polyaxial screw heads. Compression was applied on the left side at L4-5 and  on the right side at L5-S1 before shearing off the locking caps.. A cross  connector was placed. The wound was irrigated. Gelfoam was left over the  exposed epidural space. Final x-ray was taken confirming appropriate  positioning of the instrumentation. Deep Hemovac drain left in place. Deep  fascia closed with a  running #1 Vicryl suture. Subcutaneous layer closed  with 2-0 Vicryl, followed by a running 3-0 subcuticular Vicryl suture on the  skin edges. Benzoin, Steri-Strips placed.  Sterile dressing applied. The  patient was extubated without difficulty, transferred to recovery in stable  condition.      Sharolyn Douglas, M.D.  Electronically Signed     MC/MEDQ  D:  11/06/2004  T:  11/07/2004  Job:  [id number]

## 2010-08-02 NOTE — H&P (Signed)
NAME:  Theresa [name], Theresa [name] NO.:  [id number]   MEDICAL RECORD NO.:  [id number]          PATIENT TYPE:  INP   LOCATION:                               FACILITY:  [facility]   PHYSICIAN:  Sharolyn Douglas, M.D.        DATE OF BIRTH:  [birth year]   DATE OF ADMISSION:  11/06/2004  DATE OF DISCHARGE:                                HISTORY & PHYSICAL   CHIEF COMPLAINT:  Pain in my back and my left leg.   HISTORY OF PRESENT ILLNESS:  This 77 year old lady seen by [place] for continued  progressive problems  concerning pain into her lumbrosacral area with  radiation to the buttocks and into the right leg.  She has degenerative  scoliosis well documented by x-ray.  She has had epidural steroid injections  in the past which has given her short term relief.  She now has noted that  the overall quality of her life and her ability to do day to day activities  is markedly interfered by the pain.  She has had conservative care which  first was offered by Dr. Darryll Capers, her primary care physician.  MRI has  shown advanced, multi level degenerative spondylosis and neuro compromise at  multiple sites.  Her severe degenerative changes are noted at L2-L3, L4-L5  with foraminal stenosis seen at L4-5.  L5-S1 is also markedly involved.  After much discussion including risks and benefits of surgery as well as the  fact that overall conservative care has not provided her with any long-term  relief, it was decided to go ahead with surgical intervention, with an L2-S1  laminectomy with fusion.  This history and physical is performed in our  office on November 01, 2004 and the patient is fitted with an EBI lumbosacral  orthosis which will be used postoperatively.   This patient has been cleared preoperatively by Dr. Darryll Capers for this  surgical procedure.   PAST MEDICAL HISTORY:  This patient has no medical allergies.  Current  medications are Armour thyroid 120 mg 1 daily (micrograms), Toprol 50 mg  t.i.d.,  Minocycline 100 mg daily, Cleocin-T lotion b.i.d., Imuran 100 mg  daily (not taken at present time), prednisone 2.5 mg q.o.d., Voltaren 100 mg  1 daily (will stop prior to surgery), Benadryl 25 mg h.s., Fosamax,  Serefolin 1 cap daily. AcipHex 20 mg 1 daily, multivitamin, vitamin A,  glucosamine chondroitin, Caltrate, aspirin 81 mg once a day (will stop prior  to surgery) and vitamin C and vitamin D.   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   Surgically, she has had tonsillectomy, adenoidectomy back in the 1940s,  cholecystectomy in 1997.   FAMILY HISTORY:  Positive for cancer in the father, mini strokes in the  mother, as well as osteoarthritis.  Father had esophageal diverticula.   SOCIAL HISTORY:  The patient is married, retired, has no intake of tobacco  products, has 1-3 glasses of wine daily.  No children.  Her husband is  Pricilla Handler who will be primary caregiver after surgery.   REVIEW OF SYSTEMS:  CNS:  No seizure or paralysis or double vision, but the  patient does have radiculitis as mentioned above from the lumbar spine.  CARDIOVASCULAR:  No chest pain, no angina or orthopnea.  RESPIRATORY:  No  productive cough or shortness of breath.  GASTROINTESTINAL:  No nausea,  vomiting, or bloody stool. GENITOURINARY:  No discharge or hematuria.  MUSCULOSKELETAL:  Primarily in history of present illness.   PHYSICAL EXAMINATION:  Alert and cooperative, friendly, 77 year old lady who  looks younger than her stated age.  Vital signs are blood pressure 130/80,  pulse 76, respirations 16.  HEENT:  Normocephalic, PERRLA.  Oropharynx is clear.  CHEST:  Clear to auscultation, no rhonchi, no rales.  HEART:  Regular rate and rhythm.  There is a grade 4/6 holosystolic murmur  heard best at the apex.  ABDOMEN:  Soft, nontender.  Liver and spleen not felt.  GENITALIA:  Rectal, pelvic, breast not done,  not important to present  illness.  EXTREMITIES:  The patient had negative straight leg raise bilaterally, is  tender to palpitation in the lumbar spine.   ADMITTING DIAGNOSES:  Degenerative scoliosis with spinal stenosis L2 to S1.   PLAN:  The patient will undergo L2-S1 laminectomy and posterior spinal  fusion with pedicle screws, allograft and bone, morphogenic protein.  Should  she have any medical problems we will certainly ask Dr. Darryll Capers to  follow along with [place] during this patient's hospitalization.      Dooley L. Cherlynn June.      Sharolyn Douglas, M.D.  Electronically Signed    DLU/MEDQ  D:  11/02/2004  T:  11/02/2004  Job:  [id number]   cc:   Stacie Glaze, M.D. [facility]  [address] [place]  [place] [address]

## 2010-08-02 NOTE — Assessment & Plan Note (Signed)
[facility]                            CARDIOLOGY OFFICE NOTE   Theresa [name], Theresa [name]                      MRN:          [id number]  DATE:04/15/2006                            DOB:          1931-07-19    PRIMARY CARE PHYSICIAN:  Dr. Darryll Capers.   CLINICAL HISTORY:  Theresa [name] returned for a followup visit after her  recent evaluation with a transesophageal echocardiogram.  She is 77  years old and has a history of mitral valve prolapse and mitral  regurgitation.  Recently, she developed some transient symptoms of  shortness of breath, and was evaluated by Dr. Lovell Sheehan who felt her heart  murmur was louder, and did an echocardiogram which showed her mitral  regurgitation had become severe with a posteriorly directed jet.  I saw  her in consultation.  We arranged for her to have a transesophageal  echo.  The transesophageal echo showed moderate mitral valve prolapse  involving the anterior leaflet with severe mitral regurgitation with an  eccentric jet directed posteriorly.  The left atrium was moderately  dilated, and the left ventricular function was well preserved with an  ejection fraction of 55%-65%.  The estimated pulmonary artery pressure  was mildly elevated.   She came back today to talk about the results of her TE, and plan  further treatment strategies.   Her past medical history is significant for a lumbar spine surgery and  gallbladder surgery.  She has a remote history of paroxysmal atrial  fibrillation.  There is no history of hypertension, diabetes, or  hyperlipidemia.   Her current medications include:  1. Thyroid supplement.  2. Metoprolol.  3. Minocycline.  4. AcipHex.  5. Lodine.  6. Aspirin.  7. Fish oil.   On examination today, blood pressure is 123/72 and the pulse is 71 and  regular.  There was no venous distension.  The carotid pulses were full without  bruits.  The chest was clear without rales or rhonchi.  The cardiac rhythm was regular.  The heart sounds were normal.  There  was a grade 3/6, high pitched, systolic murmur with late systolic  accentuation at the apex, and toward the axilla, left sternal border and  the base.  The abdomen was soft with normal bowel sounds.  Peripheral pulses were  full and there was no peripheral edema.   IMPRESSION:  1. Severe mitral regurgitation related to mitral valve prolapse.  2. Good left ventricular function.  3. History of paroxysmal atrial fibrillation.   RECOMMENDATIONS:  I discussed with Theresa [name] regarding the possible  need for mitral valve repair, despite the fact she is having minimal  symptoms.  She has had a few episodes of shortness of breath with  exertion when she walks with her husband, which she does on a regular  basis every morning.  She feels that has returned to baseline however,  and that her exercise tolerance is as good now as it was a year ago.  I  told her I thought if the valve could be repaired with a high  probability of success, this  was probably the best option.  Will plan to  arrange for Dr. Silvestre Mesi at [facility] to review her transthoracic  and transesophageal echocardiograms regarding the feasibility of repair.  She will check to make sure that her insurance will cover [facility]  [facility].  I told her I would be in touch with her by phone as soon as  I have had communication with Dr. Judd Gaudier.     Bruce Elvera Lennox Juanda Chance, MD, [facility]  Electronically Signed    BRB/MedQ  DD: 04/15/2006  DT: 04/15/2006  Job #: [id number]   cc:   Stacie Glaze, MD

## 2010-08-05 ENCOUNTER — Ambulatory Visit (INDEPENDENT_AMBULATORY_CARE_PROVIDER_SITE_OTHER): Payer: Medicare Other | Admitting: Internal Medicine

## 2010-08-05 DIAGNOSIS — I4891 Unspecified atrial fibrillation: Secondary | ICD-10-CM

## 2010-08-05 LAB — POCT INR: INR: 1.5

## 2010-08-05 NOTE — Patient Instructions (Signed)
Take 5 mg only tomorrow then back to Same dose

## 2010-08-27 ENCOUNTER — Encounter: Payer: Self-pay | Admitting: Internal Medicine

## 2010-08-27 ENCOUNTER — Ambulatory Visit (INDEPENDENT_AMBULATORY_CARE_PROVIDER_SITE_OTHER): Payer: Medicare Other | Admitting: Internal Medicine

## 2010-08-27 DIAGNOSIS — M810 Age-related osteoporosis without current pathological fracture: Secondary | ICD-10-CM

## 2010-08-27 DIAGNOSIS — M19049 Primary osteoarthritis, unspecified hand: Secondary | ICD-10-CM

## 2010-08-27 DIAGNOSIS — E039 Hypothyroidism, unspecified: Secondary | ICD-10-CM

## 2010-08-27 DIAGNOSIS — I059 Rheumatic mitral valve disease, unspecified: Secondary | ICD-10-CM

## 2010-08-27 DIAGNOSIS — I4891 Unspecified atrial fibrillation: Secondary | ICD-10-CM

## 2010-08-27 MED ORDER — METHYLPREDNISOLONE ACETATE 40 MG/ML IJ SUSP: 20.0000 mg | Freq: Once | INTRAMUSCULAR | Status: DC

## 2010-08-27 NOTE — Assessment & Plan Note (Signed)
Patient has joint deformity of the MCPs on all fingers with exception of the ring finger on the left hand they are the joint appears to have some sent synovitis and may respond to steroid injection.  Informed consent was obtained and 20 mg of Depo-Medrol was injected into the joint space patient tolerated the procedure well.

## 2010-09-05 ENCOUNTER — Ambulatory Visit (INDEPENDENT_AMBULATORY_CARE_PROVIDER_SITE_OTHER): Payer: Medicare Other | Admitting: Internal Medicine

## 2010-09-05 DIAGNOSIS — I4891 Unspecified atrial fibrillation: Secondary | ICD-10-CM

## 2010-09-05 LAB — POCT INR: INR: 1.6

## 2010-09-05 NOTE — Patient Instructions (Addendum)
Take 5mg . Today. Then take 2.5mg  on Mondays and Thursdays and all other days take 5mg . Check in 3 weeks

## 2010-09-26 ENCOUNTER — Ambulatory Visit: Payer: Medicare Other | Admitting: Internal Medicine

## 2010-09-26 DIAGNOSIS — I4891 Unspecified atrial fibrillation: Secondary | ICD-10-CM

## 2010-09-26 LAB — POCT INR: INR: 1.9

## 2010-09-26 NOTE — Patient Instructions (Signed)
Same dose,Take 2.5mg  on Monday and Thursday and all other days take 5mg . Dr. Kirtland Bouchard approved

## 2010-10-02 ENCOUNTER — Telehealth: Payer: Self-pay | Admitting: *Deleted

## 2010-10-02 ENCOUNTER — Other Ambulatory Visit (HOSPITAL_COMMUNITY): Payer: Self-pay | Admitting: Cardiovascular Disease

## 2010-10-02 ENCOUNTER — Ambulatory Visit (INDEPENDENT_AMBULATORY_CARE_PROVIDER_SITE_OTHER): Payer: Medicare Other | Admitting: Cardiovascular Disease

## 2010-10-02 ENCOUNTER — Encounter: Payer: Self-pay | Admitting: Cardiovascular Disease

## 2010-10-02 ENCOUNTER — Ambulatory Visit (HOSPITAL_COMMUNITY): Payer: Medicare Other | Attending: Cardiovascular Disease | Admitting: Radiology

## 2010-10-02 DIAGNOSIS — M4120 Other idiopathic scoliosis, site unspecified: Secondary | ICD-10-CM | POA: Insufficient documentation

## 2010-10-02 DIAGNOSIS — I059 Rheumatic mitral valve disease, unspecified: Secondary | ICD-10-CM

## 2010-10-02 DIAGNOSIS — I4891 Unspecified atrial fibrillation: Secondary | ICD-10-CM

## 2010-10-02 DIAGNOSIS — I7300 Raynaud's syndrome without gangrene: Secondary | ICD-10-CM | POA: Insufficient documentation

## 2010-10-02 DIAGNOSIS — I379 Nonrheumatic pulmonary valve disorder, unspecified: Secondary | ICD-10-CM | POA: Insufficient documentation

## 2010-10-02 DIAGNOSIS — I079 Rheumatic tricuspid valve disease, unspecified: Secondary | ICD-10-CM | POA: Insufficient documentation

## 2010-10-02 NOTE — Progress Notes (Signed)
The patient is 77 years old and returns for management of valvular heart disease and atrial fibrillation. In 2008 she had mitral valve repair at [facility] for mitral regurgitation. Postoperatively she had atrial fibrillation which resolved. She did have one history of atrial fibrillation in the remote past prior to this. She did well after that. On September 24, 2009 she had an episode of atrial fibrillation with a fast heart rate which was asymptomatic and which was noted in the GI clinic. She went to the emergency department and converted with diltiazem and was sent home on diltiazem in addition to her metoprolol. She was started on coumadin then. She has had Rx INR's with no difficulty. She has been observing SBE prophylaxis. She also has collagenous colitis which is managed by Dr. Russella Dar.   Reviewed last echo 7/11 with mild to moderate MR through repair and normal EF Reviewed echo today and mild MR with good EF  She is an elderly frail light weight female and at higher risk of bleeding on coumadin.  I showed her how to take her pulse.  I think the risk of bleeding (especially since she has been running high)  Is higher than risk of CVA so long as she is in NSR.  Will stop coumadin  ROS: Denies fever, malais, weight loss, blurry vision, decreased visual acuity, cough, sputum, SOB, hemoptysis, pleuritic pain, palpitaitons, heartburn, abdominal pain, melena, lower extremity edema, claudication, or rash.  All other systems reviewed and negative  General: Affect appropriate Healthy:  appears stated age HEENT: normal Neck supple with no adenopathy JVP normal no bruits no thyromegaly Lungs clear with no wheezing and good diaphragmatic motion Heart:  S1/S2 SEM  murmur,rub, gallop or click PMI normal Abdomen: benighn, BS positve, no tenderness, no AAA no bruit.  No HSM or HJR Distal pulses intact with no bruits No edema Neuro non-focal Skin warm and dry No muscular weakness   Current Outpatient  Prescriptions  Medication Sig Dispense Refill  . Ascorbic Acid (VITAMIN C) 500 MG tablet Take 500 mg by mouth daily.        Marland Kitchen aspirin 81 MG EC tablet Take 81 mg by mouth daily.        . Calcium Carbonate-Vitamin D (CALTRATE 600+D) 600-400 MG-UNIT per tablet Take 1 tablet by mouth 2 (two) times daily.        . Cholecalciferol (VITAMIN D) 2000 UNITS CAPS Take 1 capsule by mouth daily.        . clindamycin (CLEOCIN T) 1 % lotion APPLY AS DIRECTED  60 mL  5  . cyclobenzaprine (FLEXERIL) 5 MG tablet Take 5 mg by mouth at bedtime.        Marland Kitchen etodolac (LODINE) 400 MG tablet Take 400 mg by mouth 2 (two) times daily.        . fish oil-omega-3 fatty acids 1000 MG capsule 2 tabs po qd      . Folic Acid-Vit B6-Vit B12 (FOLBEE) 2.5-25-1 MG TABS Take 1 tablet by mouth daily.        Marland Kitchen glucosamine-chondroitin 500-400 MG tablet Take 1 tablet by mouth 2 (two) times daily.        Marland Kitchen levothyroxine (SYNTHROID, LEVOTHROID) 100 MCG tablet Take 100 mcg by mouth daily.        . metoprolol (TOPROL-XL) 100 MG 24 hr tablet Take 100 mg by mouth daily. TAKE 1 AND 1/2 TABS QD       . Multiple Vitamin (MULTIVITAMIN) tablet Take 1 tablet by  mouth daily.        Marland Kitchen omeprazole (PRILOSEC) 20 MG capsule Take 20 mg by mouth daily.        Marland Kitchen Resveratrol 250 MG CAPS Take 1 capsule by mouth daily.        . vitamin A 7500 UNIT capsule Take 7,500 Units by mouth daily.        Marland Kitchen warfarin (COUMADIN) 5 MG tablet Take 5 mg by mouth as directed.         Current Facility-Administered Medications  Medication Dose Route Frequency Provider Last Rate Last Dose  . methylPREDNISolone acetate (DEPO-MEDROL) injection 20 mg  20 mg Intra-articular Once Carrie Mew        Allergies  Rofecoxib  Electrocardiogram: NSR 80 PR 216 PVC  Assessment and Plan

## 2010-10-02 NOTE — Telephone Encounter (Signed)
FYI:  Dr. Eden Emms has d/c patient's coumadin.

## 2010-10-02 NOTE — Assessment & Plan Note (Signed)
Stable MV repair by echo today  Continue SBE prophylaxis

## 2010-10-02 NOTE — Patient Instructions (Signed)
Your physician recommends that you schedule a follow-up appointment in: 6 MONTHS WITH DR [facility]  Your physician has recommended you make the following change in your medication: STOP COUMADIN

## 2010-10-02 NOTE — Assessment & Plan Note (Signed)
Been in NSR over a year.  Higher risk of bleeding.  Stop coumadin and she will monitor her pulse for irregularities

## 2010-10-02 NOTE — Telephone Encounter (Signed)
Dr jenkins is aware 

## 2010-10-24 ENCOUNTER — Ambulatory Visit: Payer: Medicare Other

## 2010-10-30 ENCOUNTER — Other Ambulatory Visit: Payer: Self-pay | Admitting: Internal Medicine

## 2010-11-05 ENCOUNTER — Encounter: Payer: Self-pay | Admitting: Internal Medicine

## 2010-11-11 ENCOUNTER — Other Ambulatory Visit: Payer: Self-pay | Admitting: Internal Medicine

## 2010-11-12 ENCOUNTER — Other Ambulatory Visit: Payer: Self-pay | Admitting: Internal Medicine

## 2010-12-12 ENCOUNTER — Telehealth: Payer: Self-pay | Admitting: Internal Medicine

## 2010-12-12 NOTE — Telephone Encounter (Signed)
Pt called and wants to know when she had last tetanus shot? Wondering if she can get one on Tuesday when she comes in for Flu vax? Pls advise.

## 2010-12-12 NOTE — Telephone Encounter (Signed)
Needs tdap to go on trip- last td was 2009 -will get when receiving flu sh otd

## 2010-12-17 ENCOUNTER — Ambulatory Visit (INDEPENDENT_AMBULATORY_CARE_PROVIDER_SITE_OTHER): Payer: Medicare Other

## 2010-12-17 ENCOUNTER — Other Ambulatory Visit (INDEPENDENT_AMBULATORY_CARE_PROVIDER_SITE_OTHER): Payer: Medicare Other | Admitting: Internal Medicine

## 2010-12-17 DIAGNOSIS — Z23 Encounter for immunization: Secondary | ICD-10-CM

## 2010-12-17 DIAGNOSIS — Z0000 Encounter for general adult medical examination without abnormal findings: Secondary | ICD-10-CM

## 2011-01-23 ENCOUNTER — Other Ambulatory Visit (INDEPENDENT_AMBULATORY_CARE_PROVIDER_SITE_OTHER): Payer: Medicare Other

## 2011-01-23 DIAGNOSIS — Z79899 Other long term (current) drug therapy: Secondary | ICD-10-CM

## 2011-01-23 DIAGNOSIS — Z0000 Encounter for general adult medical examination without abnormal findings: Secondary | ICD-10-CM

## 2011-01-23 LAB — CBC WITH DIFFERENTIAL/PLATELET
Eosinophils Absolute: 0.3 10*3/uL (ref 0.0–0.7)
Lymphs Abs: 1.9 10*3/uL (ref 0.7–4.0)
MCHC: 33.7 g/dL (ref 30.0–36.0)
MCV: 99.6 fl (ref 78.0–100.0)
Monocytes Absolute: 0.5 10*3/uL (ref 0.1–1.0)
Neutrophils Relative %: 56.4 % (ref 43.0–77.0)
Platelets: 303 10*3/uL (ref 150.0–400.0)

## 2011-01-23 LAB — POCT URINALYSIS DIPSTICK
Nitrite, UA: NEGATIVE
Spec Grav, UA: 1.025
pH, UA: 5.5

## 2011-01-23 LAB — BASIC METABOLIC PANEL
BUN: 32 mg/dL — ABNORMAL HIGH (ref 6–23)
CO2: 29 mEq/L (ref 19–32)
Chloride: 105 mEq/L (ref 96–112)
Creatinine, Ser: 0.9 mg/dL (ref 0.4–1.2)
Glucose, Bld: 107 mg/dL — ABNORMAL HIGH (ref 70–99)

## 2011-01-23 LAB — LIPID PANEL
Cholesterol: 263 mg/dL — ABNORMAL HIGH (ref 0–200)
Total CHOL/HDL Ratio: 3
Triglycerides: 69 mg/dL (ref 0.0–149.0)

## 2011-01-23 LAB — TSH: TSH: 0.86 u[IU]/mL (ref 0.35–5.50)

## 2011-01-23 LAB — HEPATIC FUNCTION PANEL
Bilirubin, Direct: 0.1 mg/dL (ref 0.0–0.3)
Total Bilirubin: 1 mg/dL (ref 0.3–1.2)
Total Protein: 7.2 g/dL (ref 6.0–8.3)

## 2011-01-29 ENCOUNTER — Encounter: Payer: Medicare Other | Admitting: Internal Medicine

## 2011-01-30 ENCOUNTER — Ambulatory Visit (INDEPENDENT_AMBULATORY_CARE_PROVIDER_SITE_OTHER): Payer: Medicare Other | Admitting: Internal Medicine

## 2011-01-30 ENCOUNTER — Encounter: Payer: Self-pay | Admitting: Internal Medicine

## 2011-01-30 VITALS — BP 136/80 | HR 72 | Temp 98.2°F | Resp 16 | Ht 61.0 in | Wt 116.0 lb

## 2011-01-30 DIAGNOSIS — Z0000 Encounter for general adult medical examination without abnormal findings: Secondary | ICD-10-CM

## 2011-01-30 NOTE — Progress Notes (Signed)
Subjective:    Patient ID: Theresa [name], female    DOB: 10-16-1931, 77 y.o.   MRN: [id number]  HPI Patient presents for annual health maintenance physical examination.  Her past medical history is significant for paroxysmal atrial fibrillation mitral valve prolapse and osteoarthritis.  She has mild/moderate gastroesophageal reflux   Review of Systems  Constitutional: Negative for activity change, appetite change and fatigue.  HENT: Negative for ear pain, congestion, neck pain, postnasal drip and sinus pressure.   Eyes: Negative for redness and visual disturbance.  Respiratory: Negative for cough, shortness of breath and wheezing.   Gastrointestinal: Negative for abdominal pain and abdominal distention.  Genitourinary: Negative for dysuria, frequency and menstrual problem.  Musculoskeletal: Negative for myalgias, joint swelling and arthralgias.  Skin: Negative for rash and wound.  Neurological: Negative for dizziness, weakness and headaches.  Hematological: Negative for adenopathy. Does not bruise/bleed easily.  Psychiatric/Behavioral: Negative for sleep disturbance and decreased concentration.   Past Medical History  Diagnosis Date  . Arrhythmia     AFIB  . Osteoarthritis   . Osteoporosis   . History of postmenopausal HRT   . MVP (mitral valve prolapse)   . Thyroid disease     HYPOTHYROIDISM  . Hx of adenomatous colonic polyps   . Collagenous colitis     History   Social History  . Marital Status: Married    Spouse Name: N/A    Number of Children: N/A  . Years of Education: N/A   Occupational History  . RETIRED    Social History Main Topics  . Smoking status: Former Games developer  . Smokeless tobacco: Not on file  . Alcohol Use: Yes     DRINKS WINE DAILY  . Drug Use: No  . Sexually Active: Not Currently   Other Topics Concern  . Not on file   Social History Narrative  . No narrative on file    Past Surgical History  Procedure Date  . Cholecystectomy   .  Lumbar fusion   . Tonsillectomy   . Dilation and curettage of uterus   . Cataract extraction   . Mitral valve repair 2008    Family History  Problem Relation Age of Onset  . Cancer Father     COLON  . Stroke Neg Hx     1ST DEGREE RELATIVE <60    Allergies  Allergen Reactions  . Rofecoxib     REACTION: Elevated LFT's    Current Outpatient Prescriptions on File Prior to Visit  Medication Sig Dispense Refill  . Ascorbic Acid (VITAMIN C) 500 MG tablet Take 500 mg by mouth daily.        Marland Kitchen aspirin 81 MG EC tablet Take 81 mg by mouth daily.        . Calcium Carbonate-Vitamin D (CALTRATE 600+D) 600-400 MG-UNIT per tablet Take 1 tablet by mouth 2 (two) times daily.        . Cholecalciferol (VITAMIN D) 2000 UNITS CAPS Take 1 capsule by mouth daily.        . clindamycin (CLEOCIN T) 1 % lotion APPLY AS DIRECTED  60 mL  5  . cyclobenzaprine (FLEXERIL) 5 MG tablet TAKE 1 TABLET AT BEDTIME  90 tablet  1  . etodolac (LODINE) 400 MG tablet TAKE 1 TABLET TWICE A DAY  180 tablet  1  . fish oil-omega-3 fatty acids 1000 MG capsule 2 tabs po qd      . FOLBEE 2.5-25-1 MG TABS TAKE ONE TABLET BY MOUTH EVERY DAY  30 each  10  . glucosamine-chondroitin 500-400 MG tablet Take 1 tablet by mouth 2 (two) times daily.        Marland Kitchen levothyroxine (SYNTHROID, LEVOTHROID) 100 MCG tablet TAKE 1 TABLET DAILY  90 tablet  1  . metoprolol (TOPROL-XL) 100 MG 24 hr tablet Take 100 mg by mouth daily. TAKE 1 AND 1/2 TABS QD       . Multiple Vitamin (MULTIVITAMIN) tablet Take 1 tablet by mouth daily.        Marland Kitchen omeprazole (PRILOSEC) 20 MG capsule TAKE 1 CAPSULE DAILY  90 capsule  1  . Resveratrol 250 MG CAPS Take 1 capsule by mouth daily.        . vitamin A 7500 UNIT capsule Take 7,500 Units by mouth daily.         Current Facility-Administered Medications on File Prior to Visit  Medication Dose Route Frequency Provider Last Rate Last Dose  . methylPREDNISolone acetate (DEPO-MEDROL) injection 20 mg  20 mg Intra-articular  Once Carrie Mew        BP 136/80  Pulse 72  Temp 98.2 F (36.8 C)  Resp 16  Ht 5\' 1"  (1.549 m)  Wt 116 lb (52.617 kg)  BMI 21.92 kg/m2       Objective:   Physical Exam  Nursing note and vitals reviewed. Constitutional: She is oriented to person, place, and time. She appears well-developed and well-nourished. No distress.  HENT:  Head: Normocephalic and atraumatic.  Right Ear: External ear normal.  Left Ear: External ear normal.  Nose: Nose normal.  Mouth/Throat: Oropharynx is clear and moist.  Eyes: Conjunctivae and EOM are normal. Pupils are equal, round, and reactive to light.  Neck: Normal range of motion. Neck supple. No JVD present. No tracheal deviation present. No thyromegaly present.  Cardiovascular: Normal rate, regular rhythm, normal heart sounds and intact distal pulses.   No murmur heard. Pulmonary/Chest: Effort normal and breath sounds normal. She has no wheezes. She exhibits no tenderness.  Abdominal: Soft. Bowel sounds are normal.  Musculoskeletal: Normal range of motion. She exhibits no edema and no tenderness.  Lymphadenopathy:    She has no cervical adenopathy.  Neurological: She is alert and oriented to person, place, and time. She has normal reflexes. No cranial nerve deficit.  Skin: Skin is warm and dry. She is not diaphoretic.  Psychiatric: She has a normal mood and affect. Her behavior is normal.          Assessment & Plan:   This is a routine physical examination for this healthy  Female. Reviewed all health maintenance protocols including mammography colonoscopy bone density and reviewed appropriate screening labs. Her immunization history was reviewed as well as her current medications and allergies refills of her chronic medications were given and the plan for yearly health maintenance was discussed all orders and referrals were made as appropriate.  Generally doing well her primary complaint is the osteoarthritis of her hands at this  point she does not want to do any injection therapy we discussed the possibility of using a topical diclofenac preparation samples of this progression given to the patient to try.  Atrial fibrillation appears to be stable she is followed by cardiology she has not been on any anticoagulation at this point because of the paroxysmal nature of rate it was decided to discontinue the Coumadin

## 2011-02-27 ENCOUNTER — Telehealth: Payer: Self-pay | Admitting: *Deleted

## 2011-02-27 MED ORDER — BECLOMETHASONE DIPROPIONATE 80 MCG/ACT NA AERS: 2.0000 | INHALATION_SPRAY | Freq: Every day | NASAL | Status: DC

## 2011-02-27 NOTE — Telephone Encounter (Signed)
Was given samples of q nasal wants script send [facility]

## 2011-03-03 ENCOUNTER — Telehealth: Payer: Self-pay

## 2011-03-03 MED ORDER — FLUTICASONE PROPIONATE 50 MCG/ACT NA SUSP: 2.0000 | Freq: Every day | NASAL | Status: DC

## 2011-03-03 NOTE — Telephone Encounter (Signed)
Per dr Lovell Sheehan may call in generic flonase--

## 2011-03-03 NOTE — Telephone Encounter (Signed)
Pt called and stated that Q-nasalwas not covered by her insurance and it cost $100 at [facility].  Pt would like to know if a generic could be sent in or another rx that would be cheaper.  Pls advise.

## 2011-03-03 NOTE — Telephone Encounter (Signed)
Sent in

## 2011-03-19 ENCOUNTER — Telehealth: Payer: Self-pay

## 2011-03-19 NOTE — Telephone Encounter (Signed)
Pt was treated in [place] for a stroke.  Pt is home and needs an appt with Dr. Lovell Sheehan in 1 week.  Pt's records from [place] are being faxed.  Pls advise.

## 2011-03-19 NOTE — Telephone Encounter (Signed)
Ov given for 1-3-husband aware

## 2011-03-20 ENCOUNTER — Encounter: Payer: Self-pay | Admitting: Internal Medicine

## 2011-03-20 ENCOUNTER — Encounter: Payer: Self-pay | Admitting: Neurology

## 2011-03-20 ENCOUNTER — Ambulatory Visit (INDEPENDENT_AMBULATORY_CARE_PROVIDER_SITE_OTHER): Payer: Medicare Other | Admitting: Internal Medicine

## 2011-03-20 DIAGNOSIS — I69922 Dysarthria following unspecified cerebrovascular disease: Secondary | ICD-10-CM

## 2011-03-20 DIAGNOSIS — E039 Hypothyroidism, unspecified: Secondary | ICD-10-CM

## 2011-03-20 DIAGNOSIS — I6350 Cerebral infarction due to unspecified occlusion or stenosis of unspecified cerebral artery: Secondary | ICD-10-CM

## 2011-03-20 DIAGNOSIS — IMO0002 Reserved for concepts with insufficient information to code with codable children: Secondary | ICD-10-CM

## 2011-03-20 DIAGNOSIS — I63512 Cerebral infarction due to unspecified occlusion or stenosis of left middle cerebral artery: Secondary | ICD-10-CM

## 2011-03-20 NOTE — Progress Notes (Signed)
Subjective:    Patient ID: Theresa [name], female    DOB: [birth year], 78 y.o.   MRN: [id number]  HPI Patient is a 78 year old white female who had an acute infarct of the brain in December of 2012 diagnosed at the [facility] an MRI showed a 3.8 cm x 3 cm left parietal acute infarct without hemorrhage. She had carotid Dopplers which were normal and an echo with a bubble test that did not show any problems with the septum of the heart. There was some comment about ejection fraction or diastolic dysfunction we do not have a copy of the echocardiogram. In the discharge summary it simply states that the echocardiogram showed mitral stenosis with moderate mitral regurgitation. She does have a history of atrial fibrillation states that during the hospitalization they did not comment on her being out of she is on Toprol-XL 100 one and a half tablet daily.  She is currently on a combination of Lipitor and Plavix the aspirin was held at the time of her discharge.  Due to her valvular disease she was placed on one of the newer agents    Review of Systems  Constitutional: Negative for activity change, appetite change and fatigue.  HENT: Negative for ear pain, congestion, neck pain, postnasal drip and sinus pressure.   Eyes: Negative for redness and visual disturbance.  Respiratory: Negative for cough, shortness of breath and wheezing.   Gastrointestinal: Negative for abdominal pain and abdominal distention.  Genitourinary: Negative for dysuria, frequency and menstrual problem.  Musculoskeletal: Negative for myalgias, joint swelling and arthralgias.  Skin: Negative for rash and wound.  Neurological: Positive for facial asymmetry, speech difficulty, weakness and numbness. Negative for dizziness and headaches.  Hematological: Negative for adenopathy. Does not bruise/bleed easily.  Psychiatric/Behavioral: Negative for sleep disturbance and decreased concentration.       Past Medical History   Diagnosis Date  . Arrhythmia     AFIB  . Osteoarthritis   . Osteoporosis   . History of postmenopausal HRT   . MVP (mitral valve prolapse)   . Thyroid disease     HYPOTHYROIDISM  . Hx of adenomatous colonic polyps   . Collagenous colitis     History   Social History  . Marital Status: Married    Spouse Name: N/A    Number of Children: N/A  . Years of Education: N/A   Occupational History  . RETIRED    Social History Main Topics  . Smoking status: Former Games developer  . Smokeless tobacco: Not on file  . Alcohol Use: Yes     DRINKS WINE DAILY  . Drug Use: No  . Sexually Active: Not Currently   Other Topics Concern  . Not on file   Social History Narrative  . No narrative on file    Past Surgical History  Procedure Date  . Cholecystectomy   . Lumbar fusion   . Tonsillectomy   . Dilation and curettage of uterus   . Cataract extraction   . Mitral valve repair 2008    Family History  Problem Relation Age of Onset  . Cancer Father     COLON  . Stroke Neg Hx     1ST DEGREE RELATIVE <60    Allergies  Allergen Reactions  . Rofecoxib     REACTION: Elevated LFT's    Current Outpatient Prescriptions on File Prior to Visit  Medication Sig Dispense Refill  . Ascorbic Acid (VITAMIN C) 500 MG tablet Take 500 mg by mouth  daily.        . Beclomethasone Dipropionate 80 MCG/ACT AERS Place 2 sprays into the nose daily.  3 Inhaler  3  . Calcium Carbonate-Vitamin D (CALTRATE 600+D) 600-400 MG-UNIT per tablet Take 1 tablet by mouth 2 (two) times daily.        . Cholecalciferol (VITAMIN D) 2000 UNITS CAPS Take 1 capsule by mouth daily.        . clindamycin (CLEOCIN T) 1 % lotion APPLY AS DIRECTED  60 mL  5  . cyclobenzaprine (FLEXERIL) 5 MG tablet TAKE 1 TABLET AT BEDTIME  90 tablet  1  . etodolac (LODINE) 400 MG tablet TAKE 1 TABLET TWICE A DAY  180 tablet  1  . fish oil-omega-3 fatty acids 1000 MG capsule 2 tabs po qd      . fluticasone (FLONASE) 50 MCG/ACT nasal spray  Place 2 sprays into the nose daily.  16 g  6  . FOLBEE 2.5-25-1 MG TABS TAKE ONE TABLET BY MOUTH EVERY DAY  30 each  10  . glucosamine-chondroitin 500-400 MG tablet Take 1 tablet by mouth 2 (two) times daily.        Marland Kitchen levothyroxine (SYNTHROID, LEVOTHROID) 100 MCG tablet TAKE 1 TABLET DAILY  90 tablet  1  . metoprolol (TOPROL-XL) 100 MG 24 hr tablet Take 100 mg by mouth daily. TAKE 1 AND 1/2 TABS QD       . Multiple Vitamin (MULTIVITAMIN) tablet Take 1 tablet by mouth daily.        Marland Kitchen omeprazole (PRILOSEC) 20 MG capsule TAKE 1 CAPSULE DAILY  90 capsule  1  . Resveratrol 250 MG CAPS Take 1 capsule by mouth daily.        . vitamin A 7500 UNIT capsule Take 7,500 Units by mouth daily.         Current Facility-Administered Medications on File Prior to Visit  Medication Dose Route Frequency Provider Last Rate Last Dose  . methylPREDNISolone acetate (DEPO-MEDROL) injection 20 mg  20 mg Intra-articular Once Nanda Quinton Ayvah Caroll        BP 150/90  Pulse 72  Temp 97.9 F (36.6 C)  Resp 16  Ht 5\' 1"  (1.549 m)  Wt 112 lb (50.803 kg)  BMI 21.16 kg/m2    Objective:   Physical Exam  Nursing note and vitals reviewed. Constitutional: She is oriented to person, place, and time. She appears well-developed and well-nourished. No distress.  HENT:  Head: Normocephalic and atraumatic.  Right Ear: External ear normal.  Left Ear: External ear normal.  Nose: Nose normal.  Mouth/Throat: Oropharynx is clear and moist.  Eyes: Conjunctivae and EOM are normal. Pupils are equal, round, and reactive to light.  Neck: Normal range of motion. Neck supple. No JVD present. No tracheal deviation present. No thyromegaly present.  Cardiovascular: Normal rate, regular rhythm, normal heart sounds and intact distal pulses.   No murmur heard. Pulmonary/Chest: Effort normal and breath sounds normal. She has no wheezes. She exhibits no tenderness.  Abdominal: Soft. Bowel sounds are normal.  Musculoskeletal: Normal range of  motion. She exhibits no edema and no tenderness.  Lymphadenopathy:    She has no cervical adenopathy.  Neurological: She is alert and oriented to person, place, and time. She displays abnormal reflex. No cranial nerve deficit. Coordination normal.       Equal grips  Skin: Skin is warm and dry. She is not diaphoretic.  Psychiatric: She has a normal mood and affect. Her behavior is normal.  Assessment & Plan:  Patient has a history of atrial fibrillation and had an acute left brain infarct. Her blood pressure is well controlled and she was placed on Plavix and Lipitor but not started on aspirin she has a history of mitral valvular disease therefore was not started on one of the newer anticoagulation agents she was not placed on Coumadin.  The residual from the stroke is primarily dysarthria she has clear mentation and has equal grips she had some minor right-sided weakness that has completely resolved.  I am going to refer her to Dr. Harley Alto wall for neurological evaluation and to determine whether or not a baby aspirin should be added to the Plavix.  I do not have a copy of her echocardiogram and we'll need to obtain the full study.  I believe she should be referred to speech and occupational therapy  I feel that she should be on coumadin but will defer to neurology and cardiology.

## 2011-03-20 NOTE — Patient Instructions (Signed)
The patient is instructed to continue all medications as prescribed. Schedule followup with check out clerk upon leaving the clinic  

## 2011-03-21 LAB — TSH: TSH: 1.55 u[IU]/mL (ref 0.35–5.50)

## 2011-03-25 ENCOUNTER — Ambulatory Visit: Payer: Medicare Other | Attending: Internal Medicine

## 2011-03-25 DIAGNOSIS — Z5189 Encounter for other specified aftercare: Secondary | ICD-10-CM | POA: Insufficient documentation

## 2011-03-25 DIAGNOSIS — R209 Unspecified disturbances of skin sensation: Secondary | ICD-10-CM | POA: Insufficient documentation

## 2011-03-25 DIAGNOSIS — I69920 Aphasia following unspecified cerebrovascular disease: Secondary | ICD-10-CM | POA: Insufficient documentation

## 2011-03-27 ENCOUNTER — Other Ambulatory Visit: Payer: Self-pay | Admitting: Cardiovascular Disease

## 2011-03-27 MED ORDER — METOPROLOL SUCCINATE ER 100 MG PO TB24: 100.0000 mg | ORAL_TABLET | Freq: Every day | ORAL | Status: DC

## 2011-03-28 ENCOUNTER — Ambulatory Visit: Payer: Medicare Other | Admitting: Physical Therapy

## 2011-03-28 ENCOUNTER — Other Ambulatory Visit: Payer: Self-pay | Admitting: *Deleted

## 2011-03-28 ENCOUNTER — Ambulatory Visit: Payer: Medicare Other | Admitting: *Deleted

## 2011-03-28 MED ORDER — ETODOLAC 400 MG PO TABS: 400.0000 mg | ORAL_TABLET | Freq: Every day | ORAL | Status: DC

## 2011-03-28 MED ORDER — LEVOTHYROXINE SODIUM 100 MCG PO TABS: 100.0000 ug | ORAL_TABLET | ORAL | Status: DC

## 2011-03-28 MED ORDER — OMEPRAZOLE 20 MG PO CPDR: 20.0000 mg | DELAYED_RELEASE_CAPSULE | Freq: Every day | ORAL | Status: DC

## 2011-03-28 MED ORDER — CYCLOBENZAPRINE HCL 5 MG PO TABS: 5.0000 mg | ORAL_TABLET | Freq: Every evening | ORAL | Status: DC | PRN

## 2011-04-01 ENCOUNTER — Ambulatory Visit: Payer: Medicare Other

## 2011-04-01 ENCOUNTER — Other Ambulatory Visit: Payer: Self-pay | Admitting: Cardiovascular Disease

## 2011-04-01 MED ORDER — METOPROLOL SUCCINATE ER 100 MG PO TB24: ORAL_TABLET | ORAL | Status: DC

## 2011-04-07 ENCOUNTER — Other Ambulatory Visit: Payer: Self-pay | Admitting: *Deleted

## 2011-04-07 ENCOUNTER — Ambulatory Visit: Payer: Medicare Other

## 2011-04-07 MED ORDER — CYCLOBENZAPRINE HCL 5 MG PO TABS: 5.0000 mg | ORAL_TABLET | Freq: Every evening | ORAL | Status: DC | PRN

## 2011-04-07 MED ORDER — ETODOLAC 400 MG PO TABS: 400.0000 mg | ORAL_TABLET | Freq: Every day | ORAL | Status: DC

## 2011-04-07 MED ORDER — LEVOTHYROXINE SODIUM 100 MCG PO TABS: 100.0000 ug | ORAL_TABLET | ORAL | Status: DC

## 2011-04-07 MED ORDER — OMEPRAZOLE 20 MG PO CPDR: 20.0000 mg | DELAYED_RELEASE_CAPSULE | Freq: Every day | ORAL | Status: DC

## 2011-04-08 ENCOUNTER — Encounter: Payer: Self-pay | Admitting: Cardiovascular Disease

## 2011-04-08 ENCOUNTER — Ambulatory Visit (INDEPENDENT_AMBULATORY_CARE_PROVIDER_SITE_OTHER): Payer: Medicare Other | Admitting: Cardiovascular Disease

## 2011-04-08 DIAGNOSIS — I4891 Unspecified atrial fibrillation: Secondary | ICD-10-CM

## 2011-04-08 DIAGNOSIS — Z8673 Personal history of transient ischemic attack (TIA), and cerebral infarction without residual deficits: Secondary | ICD-10-CM

## 2011-04-08 DIAGNOSIS — I639 Cerebral infarction, unspecified: Secondary | ICD-10-CM

## 2011-04-08 DIAGNOSIS — I059 Rheumatic mitral valve disease, unspecified: Secondary | ICD-10-CM

## 2011-04-08 DIAGNOSIS — I6350 Cerebral infarction due to unspecified occlusion or stenosis of unspecified cerebral artery: Secondary | ICD-10-CM

## 2011-04-08 HISTORY — DX: Personal history of transient ischemic attack (TIA), and cerebral infarction without residual deficits: Z86.73

## 2011-04-08 NOTE — Assessment & Plan Note (Signed)
Dont think Plavix will be protective of recurrent CVA.  Has appt with Dr Modesto Charon 2/12  With history of MV disease and PAF start coumadin

## 2011-04-08 NOTE — Assessment & Plan Note (Signed)
IN NSR and no mention of afib during stroke.  However with previous PAF and known MV disease with high likelyhood of embolic stroke would stop plavix and start coumadin.  She will get PT checked at Dr Lovell Sheehan office which is where she use to have it done

## 2011-04-08 NOTE — Assessment & Plan Note (Signed)
NO clincial MS MR stable  SBE prophylaxis

## 2011-04-08 NOTE — Patient Instructions (Signed)
Your physician wants you to follow-up in: 3 months.  You will receive a reminder letter in the mail two months in advance. If you don't receive a letter, please call our office to schedule the follow-up appointment.  Stop Plavix and Aspirin.  Start Coumdain 5mg  daily for 3 days and have PT/INR checked on Friday 04/11/2011.

## 2011-04-08 NOTE — Progress Notes (Signed)
The patient is 78 years old and returns for management of valvular heart disease and atrial fibrillation. In 2008 she had mitral valve repair at [facility] for mitral regurgitation. Postoperatively she had atrial fibrillation which resolved. She did have one history of atrial fibrillation in the remote past prior to this. She did well after that. On September 24, 2009 she had an episode of atrial fibrillation with a fast heart rate which was asymptomatic and which was noted in the GI clinic. She went to the emergency department and converted with diltiazem and was sent home on diltiazem in addition to her metoprolol. She was started on coumadin then. She has had Rx INR's with no difficulty. She has been observing SBE prophylaxis. She also has collagenous colitis which is managed by Dr. Russella Dar.  Reviewed last echo 7/11 with mild to moderate MR through repair and normal EF  Reviewed echo 7/12 and mild MR with good EF  She is an elderly frail light weight female and at higher risk of bleeding on coumadin. I showed her how to take her pulse. I think the risk of bleeding (especially since she has been running high) Is higher than risk of CVA so long as she is in NSR. Coumadin stopped 7/12  CVA of the brain in December of 2012 diagnosed at the [facility] an MRI showed a 3.8 cm x 3 cm left parietal acute infarct without hemorrhage. She had carotid Dopplers which were normal and an echo with a bubble test that did not show any problems with the septum of the heart. There was some comment about ejection fraction or diastolic dysfunction we do not have a copy of the echocardiogram. In the discharge summary it simply states that the echocardiogram showed mitral stenosis with moderate mitral regurgitation. Obvioiusly they did not appreciate mitral repair  No mention of afib during hospitalization  She is currently on a combination of Lipitor and Plavix the aspirin was held at the time of her discharge.  Due  to her valvular disease she was placed on one of the newer agents      ROS: Denies fever, malais, weight loss, blurry vision, decreased visual acuity, cough, sputum, SOB, hemoptysis, pleuritic pain, palpitaitons, heartburn, abdominal pain, melena, lower extremity edema, claudication, or rash.  All other systems reviewed and negative  General: Affect appropriate Healthy:  appears stated age HEENT: normal Neck supple with no adenopathy JVP normal no bruits no thyromegaly Lungs clear with no wheezing and good diaphragmatic motion Heart:  S1/S2 MRmurmur,rub, gallop or click PMI normal Abdomen: benighn, BS positve, no tenderness, no AAA no bruit.  No HSM or HJR Distal pulses intact with no bruits No edema Neuro non-focal dysarthria improved Skin warm and dry No muscular weakness   Current Outpatient Prescriptions  Medication Sig Dispense Refill  . Ascorbic Acid (VITAMIN C) 500 MG tablet Take 500 mg by mouth daily.        Marland Kitchen atorvastatin (LIPITOR) 20 MG tablet Take 20 mg by mouth daily.        . Beclomethasone Dipropionate 80 MCG/ACT AERS Place 2 sprays into the nose daily.  3 Inhaler  3  . Calcium Carbonate-Vitamin D (CALTRATE 600+D) 600-400 MG-UNIT per tablet Take 1 tablet by mouth 2 (two) times daily.        . Cholecalciferol (VITAMIN D) 2000 UNITS CAPS Take 1 capsule by mouth daily.        . clindamycin (CLEOCIN T) 1 % lotion APPLY AS DIRECTED  60  mL  5  . clopidogrel (PLAVIX) 75 MG tablet Take 75 mg by mouth daily.        . cyclobenzaprine (FLEXERIL) 5 MG tablet Take 1 tablet (5 mg total) by mouth at bedtime as needed for muscle spasms.  90 tablet  3  . etodolac (LODINE) 400 MG tablet Take 400 mg by mouth 2 (two) times daily.      . fish oil-omega-3 fatty acids 1000 MG capsule 2 tabs po qd      . fluticasone (FLONASE) 50 MCG/ACT nasal spray Place 2 sprays into the nose daily.  16 g  6  . FOLBEE 2.5-25-1 MG TABS TAKE ONE TABLET BY MOUTH EVERY DAY  30 each  10  .  glucosamine-chondroitin 500-400 MG tablet Take 1 tablet by mouth 2 (two) times daily.        Marland Kitchen levothyroxine (SYNTHROID, LEVOTHROID) 100 MCG tablet Take 1 tablet (100 mcg total) by mouth 1 day or 1 dose.  90 tablet  3  . metoprolol succinate (TOPROL-XL) 100 MG 24 hr tablet TAKE 1 AND 1/2 TABS (150 MG) daily  135 tablet  2  . Multiple Vitamin (MULTIVITAMIN) tablet Take 1 tablet by mouth daily.        Marland Kitchen omeprazole (PRILOSEC) 20 MG capsule Take 1 capsule (20 mg total) by mouth daily.  90 capsule  3  . Resveratrol 250 MG CAPS Take 1 capsule by mouth daily.        . vitamin A 7500 UNIT capsule Take 7,500 Units by mouth daily.         Current Facility-Administered Medications  Medication Dose Route Frequency Provider Last Rate Last Dose  . methylPREDNISolone acetate (DEPO-MEDROL) injection 20 mg  20 mg Intra-articular Once Carrie Mew, MD        Allergies  Rofecoxib  Electrocardiogram:  Assessment and Plan

## 2011-04-09 ENCOUNTER — Ambulatory Visit: Payer: Medicare Other

## 2011-04-11 ENCOUNTER — Other Ambulatory Visit (INDEPENDENT_AMBULATORY_CARE_PROVIDER_SITE_OTHER): Payer: Medicare Other | Admitting: Internal Medicine

## 2011-04-11 ENCOUNTER — Other Ambulatory Visit: Payer: Medicare Other

## 2011-04-11 DIAGNOSIS — I4891 Unspecified atrial fibrillation: Secondary | ICD-10-CM

## 2011-04-11 DIAGNOSIS — I639 Cerebral infarction, unspecified: Secondary | ICD-10-CM

## 2011-04-11 DIAGNOSIS — I6350 Cerebral infarction due to unspecified occlusion or stenosis of unspecified cerebral artery: Secondary | ICD-10-CM

## 2011-04-11 LAB — POCT INR: INR: 1.9

## 2011-04-11 NOTE — Patient Instructions (Signed)
  Latest dosing instructions   Total Sun Mon Tue Wed Thu Fri Sat   15 5 mg Hold Hold Hold Hold 5 mg 5 mg    (5 mg1) Hold Hold Hold Hold (5 mg1) (5 mg1)

## 2011-04-14 ENCOUNTER — Ambulatory Visit: Payer: Medicare Other

## 2011-04-14 DIAGNOSIS — Z5181 Encounter for therapeutic drug level monitoring: Secondary | ICD-10-CM

## 2011-04-14 DIAGNOSIS — Z7901 Long term (current) use of anticoagulants: Secondary | ICD-10-CM

## 2011-04-14 DIAGNOSIS — I4891 Unspecified atrial fibrillation: Secondary | ICD-10-CM

## 2011-04-14 NOTE — Patient Instructions (Signed)
  Latest dosing instructions   Total Sun Mon Tue Wed Thu Fri Sat   30 5 mg 2.5 mg 5 mg 5 mg 2.5 mg 5 mg 5 mg    (5 mg1) (5 mg0.5) (5 mg1) (5 mg1) (5 mg0.5) (5 mg1) (5 mg1)        

## 2011-04-16 ENCOUNTER — Ambulatory Visit: Payer: Medicare Other

## 2011-04-21 ENCOUNTER — Ambulatory Visit: Payer: Medicare Other | Attending: Internal Medicine | Admitting: Speech Pathology

## 2011-04-21 DIAGNOSIS — I69920 Aphasia following unspecified cerebrovascular disease: Secondary | ICD-10-CM | POA: Insufficient documentation

## 2011-04-21 DIAGNOSIS — IMO0001 Reserved for inherently not codable concepts without codable children: Secondary | ICD-10-CM | POA: Insufficient documentation

## 2011-04-23 NOTE — Progress Notes (Signed)
  Subjective:    Patient ID: Theresa [name], female    DOB: [birth year], 78 y.o.   MRN: [id number]  HPI    Review of Systems     Objective:   Physical Exam        Assessment & Plan:

## 2011-04-24 ENCOUNTER — Encounter: Payer: Self-pay | Admitting: Internal Medicine

## 2011-04-24 ENCOUNTER — Ambulatory Visit (INDEPENDENT_AMBULATORY_CARE_PROVIDER_SITE_OTHER): Payer: Medicare Other | Admitting: Internal Medicine

## 2011-04-24 VITALS — BP 130/78 | HR 72 | Temp 98.3°F | Resp 16 | Ht 61.0 in | Wt 113.0 lb

## 2011-04-24 DIAGNOSIS — I4891 Unspecified atrial fibrillation: Secondary | ICD-10-CM

## 2011-04-24 DIAGNOSIS — E785 Hyperlipidemia, unspecified: Secondary | ICD-10-CM

## 2011-04-24 DIAGNOSIS — I6990 Unspecified sequelae of unspecified cerebrovascular disease: Secondary | ICD-10-CM

## 2011-04-24 MED ORDER — WARFARIN SODIUM 5 MG PO TABS: 5.0000 mg | ORAL_TABLET | Freq: Every day | ORAL | Status: DC

## 2011-04-24 MED ORDER — ATORVASTATIN CALCIUM 20 MG PO TABS: 20.0000 mg | ORAL_TABLET | Freq: Every day | ORAL | Status: DC

## 2011-04-24 NOTE — Progress Notes (Signed)
  Subjective:    Patient ID: Theresa [name], female    DOB: 18-Apr-1931, 78 y.o.   MRN: [id number]  HPI patient is a 78 year old white female who had a probable embolic stroke due to atrial patient who presents for monitoring.  Has history of hypothyroidism a history of mitral valve prolapse and a history of paroxysmal atrial fibrillation.  She is doing well on her Coumadin. She has questions about continuing the statin. And she has completed her physical therapy for her speech deficits and physical deficits.  She has a chief complaint of difficulty cutting her toenails and some difficulty with persisting with speech    Review of Systems  Constitutional: Negative for activity change, appetite change and fatigue.  HENT: Negative for ear pain, congestion, neck pain, postnasal drip and sinus pressure.   Eyes: Negative for redness and visual disturbance.  Respiratory: Negative for cough, shortness of breath and wheezing.   Gastrointestinal: Negative for abdominal pain and abdominal distention.  Genitourinary: Negative for dysuria, frequency and menstrual problem.  Musculoskeletal: Negative for myalgias, joint swelling and arthralgias.  Skin: Negative for rash and wound.  Neurological: Negative for dizziness, weakness and headaches.  Hematological: Negative for adenopathy. Does not bruise/bleed easily.  Psychiatric/Behavioral: Negative for sleep disturbance and decreased concentration.       Objective:   Physical Exam  Nursing note and vitals reviewed. Constitutional: She is oriented to person, place, and time. She appears well-developed and well-nourished. No distress.  HENT:  Head: Normocephalic and atraumatic.  Right Ear: External ear normal.  Left Ear: External ear normal.  Nose: Nose normal.  Mouth/Throat: Oropharynx is clear and moist.  Eyes: Conjunctivae and EOM are normal. Pupils are equal, round, and reactive to light.  Neck: Normal range of motion. Neck supple. No JVD  present. No tracheal deviation present. No thyromegaly present.  Cardiovascular: Normal rate, regular rhythm, normal heart sounds and intact distal pulses.   No murmur heard. Pulmonary/Chest: Effort normal and breath sounds normal. She has no wheezes. She exhibits no tenderness.  Abdominal: Soft. Bowel sounds are normal.  Musculoskeletal: Normal range of motion. She exhibits no edema and no tenderness.  Lymphadenopathy:    She has no cervical adenopathy.  Neurological: She is alert and oriented to person, place, and time. She has normal reflexes. No cranial nerve deficit.  Skin: Skin is warm and dry. She is not diaphoretic.  Psychiatric: She has a normal mood and affect. Her behavior is normal.          Assessment & Plan:  We discussed using tongue posterior is and vocal exercises such as "the star shine brightly "and "right on the tip of the tongue" as tools to help her with her speech. We gave her the indication that this will take several months to fully make her change from one side of the brain to other automatic rather than have to be purposeful. We emphasized the need to continue exercise We emphasized compliance with anticoagulation In we believe that the added benefit of cholesterol lowering in stroke risk reduction is worth continuing the Lipitor at this time We will review her mitral valve disease to see if One of the newer agents might be possible or if we have to continue Coumadin therapy. I have spent more than 30 minutes examining this patient face-to-face of which over half was spent in counseling

## 2011-04-24 NOTE — Patient Instructions (Signed)
Tongue twisters and  Vocal exercizes

## 2011-04-29 ENCOUNTER — Encounter: Payer: Self-pay | Admitting: Neurology

## 2011-04-29 ENCOUNTER — Ambulatory Visit (INDEPENDENT_AMBULATORY_CARE_PROVIDER_SITE_OTHER): Payer: Medicare Other | Admitting: Neurology

## 2011-04-29 VITALS — BP 130/80 | HR 80 | Wt 113.0 lb

## 2011-04-29 DIAGNOSIS — I6930 Unspecified sequelae of cerebral infarction: Secondary | ICD-10-CM

## 2011-04-29 DIAGNOSIS — Z8673 Personal history of transient ischemic attack (TIA), and cerebral infarction without residual deficits: Secondary | ICD-10-CM

## 2011-04-29 NOTE — Progress Notes (Signed)
Dear Dr. Lovell Sheehan,  Thank you for having me see Theresa [name] in consultation today at [facility] for her problem with an ischemic stroke.  As you may recall, she is a 78 y.o. year old female with a history of ophthalmic migraines,  paroxysmal atrial fibrillation as well as MVP who had the sudden onset of aphasia and right facial weakness and right hand numbness on 12/27.  She was seen emergently in [place] and an MRI brain revealed a left parietal ischemic stroke.  Carotid doppler was unremarkable and echo revealed mitral stenosis and mitral regurg although they did not know she had an MVR.  She was discharged on clopidogrel and Lipitor having been on aspirin before this.  Upon seeing you and Dr. Eden Emms it was felt that she her stroke was likely from her PAF.  She has had no other events since then.  She has had periods of up to 45 minutes of sparkles in her eyes that move slowly across her vision.  She has had six of these events since her stroke.  This is increased from before the stroke, but she has had a long history of having these "ophthalmic migraines".  She feels her speech is improving.  She notes no other current deficits.  Past Medical History  Diagnosis Date  . Arrhythmia     AFIB  . Osteoarthritis   . Osteoporosis   . History of postmenopausal HRT   . MVP (mitral valve prolapse)   . Thyroid disease     HYPOTHYROIDISM  . Hx of adenomatous colonic polyps   . Collagenous colitis     Past Surgical History  Procedure Date  . Cholecystectomy   . Lumbar fusion   . Tonsillectomy   . Dilation and curettage of uterus   . Cataract extraction   . Mitral valve repair 2008    History   Social History  . Marital Status: Married    Spouse Name: N/A    Number of Children: N/A  . Years of Education: N/A   Occupational History  . RETIRED    Social History Main Topics  . Smoking status: Former Smoker    Quit date: 04/28/1966  . Smokeless tobacco: Never Used  . Alcohol  Use: Yes     DRINKS WINE DAILY  . Drug Use: No  . Sexually Active: Not Currently   Other Topics Concern  . None   Social History Narrative  . None    Family History  Problem Relation Age of Onset  . Cancer Father     COLON  . Stroke Neg Hx     1ST DEGREE RELATIVE <60    Current Outpatient Prescriptions on File Prior to Visit  Medication Sig Dispense Refill  . Ascorbic Acid (VITAMIN C) 500 MG tablet Take 500 mg by mouth daily.        Marland Kitchen atorvastatin (LIPITOR) 20 MG tablet Take 1 tablet (20 mg total) by mouth daily.  90 tablet  3  . Calcium Carbonate-Vitamin D (CALTRATE 600+D) 600-400 MG-UNIT per tablet Take 1 tablet by mouth 2 (two) times daily.        . Cholecalciferol (VITAMIN D) 2000 UNITS CAPS Take 1 capsule by mouth daily.        . clindamycin (CLEOCIN T) 1 % lotion APPLY AS DIRECTED  60 mL  5  . cyclobenzaprine (FLEXERIL) 5 MG tablet Take 1 tablet (5 mg total) by mouth at bedtime as needed for muscle spasms.  90 tablet  3  . etodolac (LODINE) 400 MG tablet Take 400 mg by mouth 2 (two) times daily.      . fish oil-omega-3 fatty acids 1000 MG capsule 2 tabs po qd      . FOLBEE 2.5-25-1 MG TABS TAKE ONE TABLET BY MOUTH EVERY DAY  30 each  10  . glucosamine-chondroitin 500-400 MG tablet Take 1 tablet by mouth 2 (two) times daily.        Marland Kitchen levothyroxine (SYNTHROID, LEVOTHROID) 100 MCG tablet Take 1 tablet (100 mcg total) by mouth 1 day or 1 dose.  90 tablet  3  . metoprolol succinate (TOPROL-XL) 100 MG 24 hr tablet TAKE 1 AND 1/2 TABS (150 MG) daily  135 tablet  2  . Multiple Vitamin (MULTIVITAMIN) tablet Take 1 tablet by mouth daily.        Marland Kitchen omeprazole (PRILOSEC) 20 MG capsule Take 1 capsule (20 mg total) by mouth daily.  90 capsule  3  . Resveratrol 250 MG CAPS Take 1 capsule by mouth daily.        . vitamin A 7500 UNIT capsule Take 7,500 Units by mouth daily.        Marland Kitchen warfarin (COUMADIN) 5 MG tablet Take 1 tablet (5 mg total) by mouth daily.  90 tablet  3  . fluticasone  (FLONASE) 50 MCG/ACT nasal spray Place 2 sprays into the nose daily.  16 g  6   Current Facility-Administered Medications on File Prior to Visit  Medication Dose Route Frequency Provider Last Rate Last Dose  . methylPREDNISolone acetate (DEPO-MEDROL) injection 20 mg  20 mg Intra-articular Once Carrie Mew, MD        Allergies  Allergen Reactions  . Rofecoxib     REACTION: Elevated LFT's      ROS:  13 systems were reviewed and are notable for numbness in her feet at night.  She also has severe arthritis of her hands.  All other review of systems are unremarkable.   Examination:  Filed Vitals:   04/29/11 1311  BP: 130/80  Pulse: 80  Weight: 113 lb (51.256 kg)     In general, well appearing older women.  Cardiovascular: The patient has a regular rate and rhythm, loud S1 no bruits.  Fundoscopy:  Disks are flat. Vessel caliber within normal limits.  Mental status:   The patient is oriented to person, place and time. Recent and remote memory are intact. Attention span and concentration are normal. Language including repetition, naming, following commands are intact. Fund of knowledge of current and historical events, as well as vocabulary are normal.  Spontaneous speech is impaired.  Naming intact.  Repetition slow.    Cranial Nerves: Pupils are equally round and reactive to light. Visual fields full to confrontation. Extraocular movements are intact without nystagmus. Facial sensation and muscles of mastication are intact. Muscles of facial expression reveal mild right lower facial droop. Hearing intact to bilateral finger rub. Tongue protrusion, uvula, palate midline.  Shoulder shrug intact  Motor:  The patient has normal bulk and tone, no pronator drift.  There are no adventitious movements.  5/5 muscle strength bilaterally.  Reflexes:   Biceps  Triceps Brachioradialis Knee Ankle  Right 3+  3+  3+   3+ 0  Left  2+  2+  2+   2+ 0  Toes down  Coordination:   Normal finger to nose.  No dysdiadokinesia.  Sensation is decreased in feet in length dep manner to vibration and temperature.  Gait  and Station are normal.   Romberg is negative Reports were reviewed from outside hospital.  MRI was reported as showing a solitary acute ischemic lesion in the left parietal lobe.  Impression: Ischemic stroke likely secondary to paroxysmal afib.  Patient also has signs of a subclinical peripheral neuropathy.   Recommendations: 1.  Ischemic stroke - Obviously I agree with her need for coumadin.  I am going to get copies of the MRI brain to see if there truly is one lesion.  I doubt this will change my management for now, but this will convince me it was embolic if there are scattered lesions.  If she had another event, then I would get an MRA of her head and neck. 2.  Subclinical peripheral neuropathy - I forgot to have the patients blood drawn.  I have put in screening labs for her to have drawn at her next visit with yourself, Dr. Eden Emms or myself.  I would hold off on an EMG for now.   We will see the patient back in 2 months.  Thank you for having [place] see Wendall Mola in consultation.  Feel free to contact me with any questions.  Lupita Raider Modesto Charon, MD [facility], [place] [address] [place], [place] [address] Phone: [phone] Fax: [phone].

## 2011-04-29 NOTE — Patient Instructions (Signed)
We will see you on April 30th at 9:30am.

## 2011-05-05 ENCOUNTER — Ambulatory Visit (INDEPENDENT_AMBULATORY_CARE_PROVIDER_SITE_OTHER): Payer: Medicare Other | Admitting: Internal Medicine

## 2011-05-05 DIAGNOSIS — I4891 Unspecified atrial fibrillation: Secondary | ICD-10-CM

## 2011-05-05 LAB — POCT INR: INR: 2.2

## 2011-05-05 NOTE — Patient Instructions (Signed)
  Latest dosing instructions   Total Sun Mon Tue Wed Thu Fri Sat   30 5 mg 2.5 mg 5 mg 5 mg 2.5 mg 5 mg 5 mg    (5 mg1) (5 mg0.5) (5 mg1) (5 mg1) (5 mg0.5) (5 mg1) (5 mg1)        

## 2011-05-06 ENCOUNTER — Other Ambulatory Visit: Payer: Self-pay | Admitting: *Deleted

## 2011-05-08 ENCOUNTER — Telehealth: Payer: Self-pay | Admitting: *Deleted

## 2011-05-08 NOTE — Telephone Encounter (Signed)
Pt is having cataract surgery in March, and Dr. Dagoberto Ligas wants to know how long to keep her off Coumadin?  Dr. Dagoberto Ligas is ok up to 3 days.  Please let her know.

## 2011-05-08 NOTE — Telephone Encounter (Signed)
Per dr Lovell Sheehan- 3 days is fine

## 2011-05-08 NOTE — Telephone Encounter (Signed)
Notified pt. 

## 2011-05-30 ENCOUNTER — Encounter: Payer: Self-pay | Admitting: Internal Medicine

## 2011-05-30 ENCOUNTER — Ambulatory Visit (INDEPENDENT_AMBULATORY_CARE_PROVIDER_SITE_OTHER): Payer: Medicare Other | Admitting: Internal Medicine

## 2011-05-30 VITALS — BP 110/74 | HR 72 | Temp 98.2°F | Resp 16 | Ht 61.0 in | Wt 110.0 lb

## 2011-05-30 DIAGNOSIS — E079 Disorder of thyroid, unspecified: Secondary | ICD-10-CM

## 2011-05-30 DIAGNOSIS — I4891 Unspecified atrial fibrillation: Secondary | ICD-10-CM

## 2011-05-30 DIAGNOSIS — I69922 Dysarthria following unspecified cerebrovascular disease: Secondary | ICD-10-CM | POA: Insufficient documentation

## 2011-05-30 DIAGNOSIS — I6990 Unspecified sequelae of unspecified cerebrovascular disease: Secondary | ICD-10-CM

## 2011-05-30 DIAGNOSIS — L84 Corns and callosities: Secondary | ICD-10-CM

## 2011-05-30 DIAGNOSIS — E039 Hypothyroidism, unspecified: Secondary | ICD-10-CM

## 2011-05-30 DIAGNOSIS — G589 Mononeuropathy, unspecified: Secondary | ICD-10-CM

## 2011-05-30 LAB — POCT INR: INR: 2

## 2011-05-30 NOTE — Progress Notes (Signed)
Addended by: Rita Ohara R on: 05/30/2011 10:21 AM   Modules accepted: Orders

## 2011-05-30 NOTE — Progress Notes (Signed)
Subjective:    Patient ID: Theresa [name], female    DOB: Oct 17, 1931, 78 y.o.   MRN: [id number]  HPI Foot calluses and requests referral to the podiatrist Patient is a 78 year old female who is followed for hypothyroidism hyperlipidemia and late effects of an ischemic left middle cerebral artery stroke.  She has been followed by Dr.Wong in neurology and he has requested that we do nutritional labs which I assume are the B12 B6 folic acid and a sedimentation rate because she has had associated neuropathy which I believe is not due to her stroke but possibly related to her long history of hypothyroidism or possible other idiopathic etiology.      Review of Systems  Constitutional: Negative for activity change, appetite change and fatigue.  HENT: Negative for ear pain, congestion, neck pain, postnasal drip and sinus pressure.   Eyes: Negative for redness and visual disturbance.  Respiratory: Negative for cough, shortness of breath and wheezing.   Gastrointestinal: Negative for abdominal pain and abdominal distention.  Genitourinary: Negative for dysuria, frequency and menstrual problem.  Musculoskeletal: Negative for myalgias, joint swelling and arthralgias.  Skin: Negative for rash and wound.  Neurological: Negative for dizziness, weakness and headaches.  Hematological: Negative for adenopathy. Does not bruise/bleed easily.  Psychiatric/Behavioral: Negative for sleep disturbance and decreased concentration.   Past Medical History  Diagnosis Date  . Arrhythmia     AFIB  . Osteoarthritis   . Osteoporosis   . History of postmenopausal HRT   . MVP (mitral valve prolapse)   . Thyroid disease     HYPOTHYROIDISM  . Hx of adenomatous colonic polyps   . Collagenous colitis     History   Social History  . Marital Status: Married    Spouse Name: N/A    Number of Children: N/A  . Years of Education: N/A   Occupational History  . RETIRED    Social History Main Topics  . Smoking  status: Former Smoker    Quit date: 04/28/1966  . Smokeless tobacco: Never Used  . Alcohol Use: Yes     DRINKS WINE DAILY  . Drug Use: No  . Sexually Active: Not Currently   Other Topics Concern  . Not on file   Social History Narrative  . No narrative on file    Past Surgical History  Procedure Date  . Cholecystectomy   . Lumbar fusion   . Tonsillectomy   . Dilation and curettage of uterus   . Cataract extraction   . Mitral valve repair 2008    Family History  Problem Relation Age of Onset  . Cancer Father     COLON  . Stroke Neg Hx     1ST DEGREE RELATIVE <60    Allergies  Allergen Reactions  . Rofecoxib     REACTION: Elevated LFT's    Current Outpatient Prescriptions on File Prior to Visit  Medication Sig Dispense Refill  . Ascorbic Acid (VITAMIN C) 500 MG tablet Take 500 mg by mouth daily.        Marland Kitchen atorvastatin (LIPITOR) 20 MG tablet Take 1 tablet (20 mg total) by mouth daily.  90 tablet  3  . Calcium Carbonate-Vitamin D (CALTRATE 600+D) 600-400 MG-UNIT per tablet Take 1 tablet by mouth 2 (two) times daily.        . Cholecalciferol (VITAMIN D) 2000 UNITS CAPS Take 1 capsule by mouth daily.        . clindamycin (CLEOCIN T) 1 % lotion APPLY AS DIRECTED  60 mL  5  . cyclobenzaprine (FLEXERIL) 5 MG tablet Take 1 tablet (5 mg total) by mouth at bedtime as needed for muscle spasms.  90 tablet  3  . etodolac (LODINE) 400 MG tablet Take 400 mg by mouth 2 (two) times daily.      . fish oil-omega-3 fatty acids 1000 MG capsule 2 tabs po qd      . fluticasone (FLONASE) 50 MCG/ACT nasal spray Place 2 sprays into the nose daily.  16 g  6  . FOLBEE 2.5-25-1 MG TABS TAKE ONE TABLET BY MOUTH EVERY DAY  30 each  10  . glucosamine-chondroitin 500-400 MG tablet Take 1 tablet by mouth 2 (two) times daily.        Marland Kitchen levothyroxine (SYNTHROID, LEVOTHROID) 100 MCG tablet Take 1 tablet (100 mcg total) by mouth 1 day or 1 dose.  90 tablet  3  . metoprolol succinate (TOPROL-XL) 100 MG 24  hr tablet TAKE 1 AND 1/2 TABS (150 MG) daily  135 tablet  2  . Multiple Vitamin (MULTIVITAMIN) tablet Take 1 tablet by mouth daily.        Marland Kitchen omeprazole (PRILOSEC) 20 MG capsule Take 1 capsule (20 mg total) by mouth daily.  90 capsule  3  . vitamin A 7500 UNIT capsule Take 7,500 Units by mouth daily.         Current Facility-Administered Medications on File Prior to Visit  Medication Dose Route Frequency Provider Last Rate Last Dose  . methylPREDNISolone acetate (DEPO-MEDROL) injection 20 mg  20 mg Intra-articular Once Stacie Glaze, MD        BP 110/74  Pulse 72  Temp 98.2 F (36.8 C)  Resp 16  Ht 5\' 1"  (1.549 m)  Wt 110 lb (49.896 kg)  BMI 20.78 kg/m2       Objective:   Physical Exam  Nursing note and vitals reviewed. Constitutional: She appears well-developed and well-nourished.  HENT:  Head: Normocephalic and atraumatic.       dysarthria  Eyes: Conjunctivae and EOM are normal.  Neck: Normal range of motion. Neck supple.  Cardiovascular: Normal rate and regular rhythm.   Pulmonary/Chest: Effort normal and breath sounds normal.  Neurological: A cranial nerve deficit is present. Coordination abnormal.  Skin: Skin is warm and dry.          Assessment & Plan:  We will draw a B6 B12 and folic acid today as well as look a sedimentation rate for peripheral neuropathy we will refer her to a podiatrist for the calluses and blood blister formation on the feet.  We will continue her current medications are thyroid and her current dose she continues Coumadin for anticoagulation given her history of atrial fibrillation and a stroke

## 2011-05-30 NOTE — Patient Instructions (Addendum)
The patient is instructed to continue all medications as prescribed. Schedule followup with check out clerk upon leaving the clinic     Latest dosing instructions   Total Sun Mon Tue Wed Thu Fri Sat   30 5 mg 2.5 mg 5 mg 5 mg 2.5 mg 5 mg 5 mg    (5 mg1) (5 mg0.5) (5 mg1) (5 mg1) (5 mg0.5) (5 mg1) (5 mg1)

## 2011-06-02 ENCOUNTER — Ambulatory Visit: Payer: Medicare Other

## 2011-06-02 DIAGNOSIS — G589 Mononeuropathy, unspecified: Secondary | ICD-10-CM

## 2011-06-03 LAB — METHYLMALONIC ACID, SERUM: Methylmalonic Acid, Quant: <0.09 umol/L (ref <0.40)

## 2011-06-03 LAB — FOLATE: Folate: >20 ng/mL

## 2011-06-11 NOTE — Progress Notes (Addendum)
Reviewed MRI -? all appear to be left MCA distribution infarcts, but may be multiple foci.  Particularly left MCA and left lateral ventricle choroid plexus.  consistent with embolic phenomenon.

## 2011-06-17 NOTE — Patient Instructions (Signed)
The patient is instructed to continue all medications as prescribed. Schedule followup with check out clerk upon leaving the clinic  

## 2011-06-24 ENCOUNTER — Encounter (INDEPENDENT_AMBULATORY_CARE_PROVIDER_SITE_OTHER): Payer: Medicare Other

## 2011-06-24 ENCOUNTER — Other Ambulatory Visit: Payer: Self-pay | Admitting: Cardiology

## 2011-06-24 DIAGNOSIS — I739 Peripheral vascular disease, unspecified: Secondary | ICD-10-CM

## 2011-06-24 DIAGNOSIS — E1159 Type 2 diabetes mellitus with other circulatory complications: Secondary | ICD-10-CM

## 2011-06-27 ENCOUNTER — Ambulatory Visit (INDEPENDENT_AMBULATORY_CARE_PROVIDER_SITE_OTHER): Payer: Medicare Other | Admitting: Internal Medicine

## 2011-06-27 DIAGNOSIS — I4891 Unspecified atrial fibrillation: Secondary | ICD-10-CM

## 2011-06-27 NOTE — Patient Instructions (Signed)
  Latest dosing instructions   Total Sun Mon Tue Wed Thu Fri Sat   30 5 mg 2.5 mg 5 mg 5 mg 2.5 mg 5 mg 5 mg    (5 mg1) (5 mg0.5) (5 mg1) (5 mg1) (5 mg0.5) (5 mg1) (5 mg1)        

## 2011-07-01 ENCOUNTER — Telehealth: Payer: Self-pay | Admitting: Cardiovascular Disease

## 2011-07-01 ENCOUNTER — Ambulatory Visit: Payer: Medicare Other | Admitting: Neurology

## 2011-07-01 ENCOUNTER — Other Ambulatory Visit: Payer: Self-pay | Admitting: Internal Medicine

## 2011-07-01 NOTE — Telephone Encounter (Signed)
Doppler faxed to [facility]/ [phone] 07/01/11/KM

## 2011-07-15 ENCOUNTER — Other Ambulatory Visit (INDEPENDENT_AMBULATORY_CARE_PROVIDER_SITE_OTHER): Payer: Medicare Other

## 2011-07-15 ENCOUNTER — Ambulatory Visit (INDEPENDENT_AMBULATORY_CARE_PROVIDER_SITE_OTHER): Payer: Medicare Other | Admitting: Neurology

## 2011-07-15 ENCOUNTER — Encounter: Payer: Self-pay | Admitting: Neurology

## 2011-07-15 VITALS — BP 126/84 | HR 88 | Wt 114.0 lb

## 2011-07-15 DIAGNOSIS — G609 Hereditary and idiopathic neuropathy, unspecified: Secondary | ICD-10-CM

## 2011-07-15 LAB — VITAMIN B12: Vitamin B-12: 1329 pg/mL — ABNORMAL HIGH (ref 211–911)

## 2011-07-15 NOTE — Progress Notes (Signed)
Dear Dr. Lovell Sheehan,  I saw  Theresa [name] back in [facility] for her problem with left parietal ischemic stroke.  As you may recall, she is a 78 y.o. year old female with a history of MVR, newly diagnosed PAF, ophthalmic migraines who had the sudden onset of aphasia and right facial weakness in 12/27.  MRI brain revealed a left parietal ischemic stroke, that appeared embolic to my eyes.  Carotid doppler was unremarkable and echo revealed MS and MR although they did not know she had an MVR.  She was initially switched to Plavix, but this was change to coumadin by Dr. Eden Emms due to her history of PAF.    She has had no other new events.  She was having relatively frequent ophthalmic migraines after her stroke but these have remitted.  She is still having problems with her speech.  At her first appointment, she was noted to have some subclinical signs of peripheral neuropathy.  Unfortunately, I did not check her labs at that time, but you were kind enough to check a B6, MMA, flate and sed rate.  These were unremarkable.  She does say that in November she had some time where she had pain in the ends of her toes.  She saw podiatry who thought she might have Raynaud's phenomenon, although she denies "attacks" brought on by cold.  She has had a remission of the painful toes, although still gets color changes in her feet.  Medical history, social history, and family history were reviewed and have not changed since the last clinic visit.  Current Outpatient Prescriptions on File Prior to Visit  Medication Sig Dispense Refill  . Ascorbic Acid (VITAMIN C) 500 MG tablet Take 500 mg by mouth daily.        Marland Kitchen atorvastatin (LIPITOR) 20 MG tablet Take 1 tablet (20 mg total) by mouth daily.  90 tablet  3  . Calcium Carbonate-Vitamin D (CALTRATE 600+D) 600-400 MG-UNIT per tablet Take 1 tablet by mouth 2 (two) times daily.        . Cholecalciferol (VITAMIN D) 2000 UNITS CAPS Take 1 capsule by mouth daily.         . clindamycin (CLEOCIN T) 1 % lotion APPLY AS DIRECTED  60 mL  5  . cyclobenzaprine (FLEXERIL) 5 MG tablet Take 1 tablet (5 mg total) by mouth at bedtime as needed for muscle spasms.  90 tablet  3  . etodolac (LODINE) 400 MG tablet Take 400 mg by mouth 2 (two) times daily.      . fish oil-omega-3 fatty acids 1000 MG capsule 2 tabs po qd      . fluticasone (FLONASE) 50 MCG/ACT nasal spray Place 2 sprays into the nose daily.  16 g  6  . FOLBEE 2.5-25-1 MG TABS TAKE ONE TABLET BY MOUTH EVERY DAY  30 each  10  . glucosamine-chondroitin 500-400 MG tablet Take 1 tablet by mouth 2 (two) times daily.        Marland Kitchen levothyroxine (SYNTHROID, LEVOTHROID) 100 MCG tablet Take 1 tablet (100 mcg total) by mouth 1 day or 1 dose.  90 tablet  3  . metoprolol succinate (TOPROL-XL) 100 MG 24 hr tablet TAKE 1 AND 1/2 TABS (150 MG) daily  135 tablet  2  . Multiple Vitamin (MULTIVITAMIN) tablet Take 1 tablet by mouth daily.        Marland Kitchen omeprazole (PRILOSEC) 20 MG capsule Take 1 capsule (20 mg total) by mouth daily.  90 capsule  3  . vitamin A 7500 UNIT capsule Take 7,500 Units by mouth daily.        Marland Kitchen warfarin (COUMADIN) 5 MG tablet Take by mouth daily. Takes 5 mg qd except 2.5 on Monday and thursday       Current Facility-Administered Medications on File Prior to Visit  Medication Dose Route Frequency Provider Last Rate Last Dose  . methylPREDNISolone acetate (DEPO-MEDROL) injection 20 mg  20 mg Intra-articular Once Stacie Glaze, MD        Allergies  Allergen Reactions  . Rofecoxib     REACTION: Elevated LFT's    ROS:  13 systems were reviewed and are notable for history of back pain after lumbar fusion.  All other review of systems are unremarkable.  Exam: . Filed Vitals:   07/15/11 0914  BP: 126/84  Pulse: 88  Weight: 114 lb (51.71 kg)    In general, well appearing older women.  Mental status:   The patient is oriented to person, place and time. Recent and remote memory are intact. Attention span  and concentration are normal. Language including repetition, naming, following commands are intact. Fund of knowledge of current and historical events, as well as vocabulary are normal.  + hesitancy of speech.  Cranial Nerves:  Visual fields full to confrontation. Extraocular movements are intact without nystagmus. Facial sensation and muscles of mastication are intact. Muscles of facial expression are symmetric. Tongue protrusion, uvula, palate midline.  Shoulder shrug intact  Motor:  Normal bulk and tone, no drift and 5/5 muscle strength bilaterally.  Reflexes:   Biceps  Triceps  Brachioradialis   Knee  Ankle  Right  3+   3+    3+    3+  0  Left  2+   2+    2+    2+  0  Toes down.  Sensation:  Absent vibration at toes, normal at fingers.  Decreased temp to wrist, and lower calf.  Normal position.   Coordination:  Normal finger to nose  Gait:  Mildly antalgic gait with the right leg impaired.  Romberg negative.  Impression/Recommendations: 1.  Ischemic stroke - Her major risk factor, the atrial fibrillation is obvious mitigated to some degree by the coumadin.  I didn't see an LDL, and we usually like to see this below 100 for secondary prevention, but given her high HDL, I am not sure I am as worried about this.  A repeat lipid panel probably makes sense when she returns to see you, but I will leave this up to you. 2.  ?PN - she clinically has a peripheral neuropathy.  I am going to check and SPEP, B12, RF and ANA although I think these are unlikely to be abnormal.  We will just follow her clinically.  If she begins to have pain again we can try a neuropathic medication.  We will see the patient back in  6 months.  Lupita Raider Modesto Charon, MD [facility], [place]

## 2011-07-15 NOTE — Patient Instructions (Signed)
Go to the basement to have your labs drawn today.  We will send you a reminder in the mail to make your six month follow up appointment.

## 2011-07-16 LAB — ANA: Anti Nuclear Antibody(ANA): NEGATIVE

## 2011-07-17 LAB — PROTEIN ELECTROPHORESIS, SERUM
Alpha-2-Globulin: 8 % (ref 7.1–11.8)
Beta Globulin: 5.3 % (ref 4.7–7.2)
Total Protein, Serum Electrophoresis: 6.9 g/dL (ref 6.0–8.3)

## 2011-07-21 ENCOUNTER — Telehealth: Payer: Self-pay

## 2011-07-21 NOTE — Telephone Encounter (Signed)
Pt notified of normal lab results.

## 2011-07-21 NOTE — Progress Notes (Signed)
Pt.notified

## 2011-07-22 ENCOUNTER — Ambulatory Visit: Payer: Medicare Other

## 2011-07-22 DIAGNOSIS — I4891 Unspecified atrial fibrillation: Secondary | ICD-10-CM

## 2011-07-22 DIAGNOSIS — Z8673 Personal history of transient ischemic attack (TIA), and cerebral infarction without residual deficits: Secondary | ICD-10-CM

## 2011-07-22 LAB — POCT INR: INR: 2.8

## 2011-07-22 NOTE — Patient Instructions (Signed)
  Latest dosing instructions   Total Sun Mon Tue Wed Thu Fri Sat   30 5 mg 2.5 mg 5 mg 5 mg 2.5 mg 5 mg 5 mg    (5 mg1) (5 mg0.5) (5 mg1) (5 mg1) (5 mg0.5) (5 mg1) (5 mg1)        

## 2011-08-20 ENCOUNTER — Ambulatory Visit (INDEPENDENT_AMBULATORY_CARE_PROVIDER_SITE_OTHER): Payer: Medicare Other | Admitting: Family

## 2011-08-20 DIAGNOSIS — I4891 Unspecified atrial fibrillation: Secondary | ICD-10-CM

## 2011-08-20 DIAGNOSIS — Z8673 Personal history of transient ischemic attack (TIA), and cerebral infarction without residual deficits: Secondary | ICD-10-CM

## 2011-08-20 LAB — POCT INR: INR: 2

## 2011-08-20 NOTE — Patient Instructions (Signed)
Continue same dosage and recheck in 4 weeks.     Latest dosing instructions   Total Glynis Smiles Tue Wed Thu Fri Sat   30 5 mg 2.5 mg 5 mg 5 mg 2.5 mg 5 mg 5 mg    (5 mg1) (5 mg0.5) (5 mg1) (5 mg1) (5 mg0.5) (5 mg1) (5 mg1)

## 2011-08-25 ENCOUNTER — Encounter: Payer: Self-pay | Admitting: Cardiovascular Disease

## 2011-08-25 ENCOUNTER — Ambulatory Visit (INDEPENDENT_AMBULATORY_CARE_PROVIDER_SITE_OTHER): Payer: Medicare Other | Admitting: Cardiovascular Disease

## 2011-08-25 VITALS — BP 140/78 | HR 76 | Ht 61.0 in | Wt 112.0 lb

## 2011-08-25 DIAGNOSIS — I7300 Raynaud's syndrome without gangrene: Secondary | ICD-10-CM

## 2011-08-25 DIAGNOSIS — I4891 Unspecified atrial fibrillation: Secondary | ICD-10-CM

## 2011-08-25 DIAGNOSIS — Z8673 Personal history of transient ischemic attack (TIA), and cerebral infarction without residual deficits: Secondary | ICD-10-CM

## 2011-08-25 NOTE — Assessment & Plan Note (Signed)
Likely embolic.  Continue coumadin.  May need lovenox bridge in future if coumadin stopped

## 2011-08-25 NOTE — Progress Notes (Signed)
Patient ID: Theresa [name], female   DOB: [birth year], 78 y.o.   MRN: [id number] The patient is 78 years old and returns for management of valvular heart disease and atrial fibrillation. In 2008 she had mitral valve repair at [facility] for mitral regurgitation. Postoperatively she had atrial fibrillation which resolved. She did have one history of atrial fibrillation in the remote past prior to this. She did well after that. On September 24, 2009 she had an episode of atrial fibrillation with a fast heart rate which was asymptomatic and which was noted in the GI clinic. She went to the emergency department and converted with diltiazem and was sent home on diltiazem in addition to her metoprolol. She was started on coumadin then. She has had Rx INR's with no difficulty. She has been observing SBE prophylaxis. She also has collagenous colitis which is managed by Dr. Russella Dar.  Reviewed last echo 7/11 with mild to moderate MR through repair and normal EF  Reviewed echo 7/12 and mild MR with good EF   CVA of the brain in December of 2012 diagnosed at the [facility] an MRI showed a 3.8 cm x 3 cm left parietal acute infarct without hemorrhage. She had carotid Dopplers which were normal and an echo with a bubble test that did not show any problems with the septum of the heart. There was some comment about ejection fraction or diastolic dysfunction we do not have a copy of the echocardiogram. In the discharge summary it simply states that the echocardiogram showed mitral stenosis with moderate mitral regurgitation. Obvioiusly they did not appreciate mitral repair  No mention of afib during hospitalization   Currently on coumadin with no bleeding problems  INR chart reviewed and RX  ? Raynauds in the winter no fixed PVD improved now.  W/U by podiatrist   ROS: Denies fever, malais, weight loss, blurry vision, decreased visual acuity, cough, sputum, SOB, hemoptysis, pleuritic pain, palpitaitons, heartburn,  abdominal pain, melena, lower extremity edema, claudication, or rash.  All other systems reviewed and negative  General: Affect appropriate Healthy:  appears stated age HEENT: normal Neck supple with no adenopathy JVP normal no bruits no thyromegaly Lungs clear with no wheezing and good diaphragmatic motion Heart:  S1/S2 no murmur, no rub, gallop or click PMI normal Abdomen: benighn, BS positve, no tenderness, no AAA no bruit.  No HSM or HJR Distal pulses intact with no bruits No edema Neuro non-focal Skin warm and dry Arthritic changes in hands No muscular weakness   Current Outpatient Prescriptions  Medication Sig Dispense Refill  . Ascorbic Acid (VITAMIN C) 500 MG tablet Take 500 mg by mouth daily.        Marland Kitchen atorvastatin (LIPITOR) 20 MG tablet Take 1 tablet (20 mg total) by mouth daily.  90 tablet  3  . Calcium Carbonate-Vitamin D (CALTRATE 600+D) 600-400 MG-UNIT per tablet Take 1 tablet by mouth 2 (two) times daily.        . Cholecalciferol (VITAMIN D) 2000 UNITS CAPS Take 1 capsule by mouth daily.        . clindamycin (CLEOCIN T) 1 % lotion APPLY AS DIRECTED  60 mL  5  . cyclobenzaprine (FLEXERIL) 5 MG tablet Take 1 tablet (5 mg total) by mouth at bedtime as needed for muscle spasms.  90 tablet  3  . etodolac (LODINE) 400 MG tablet Take 400 mg by mouth 2 (two) times daily.      . fish oil-omega-3 fatty acids 1000 MG  capsule 2 tabs po qd      . fluticasone (FLONASE) 50 MCG/ACT nasal spray Place 2 sprays into the nose daily.  16 g  6  . FOLBEE 2.5-25-1 MG TABS TAKE ONE TABLET BY MOUTH EVERY DAY  30 each  10  . glucosamine-chondroitin 500-400 MG tablet Take 1 tablet by mouth 2 (two) times daily.        Marland Kitchen levothyroxine (SYNTHROID, LEVOTHROID) 100 MCG tablet Take 1 tablet (100 mcg total) by mouth 1 day or 1 dose.  90 tablet  3  . metoprolol succinate (TOPROL-XL) 100 MG 24 hr tablet TAKE 1 AND 1/2 TABS (150 MG) daily  135 tablet  2  . Multiple Vitamin (MULTIVITAMIN) tablet Take 1  tablet by mouth daily.        Marland Kitchen omeprazole (PRILOSEC) 20 MG capsule Take 1 capsule (20 mg total) by mouth daily.  90 capsule  3  . Resveratrol 250 MG CAPS Take by mouth daily.      . vitamin A 7500 UNIT capsule Take 7,500 Units by mouth daily.        Marland Kitchen warfarin (COUMADIN) 5 MG tablet Take by mouth daily. Takes 5 mg qd except 2.5 on Monday and thursday       Current Facility-Administered Medications  Medication Dose Route Frequency Provider Last Rate Last Dose  . methylPREDNISolone acetate (DEPO-MEDROL) injection 20 mg  20 mg Intra-articular Once Stacie Glaze, MD        Allergies  Rofecoxib  Electrocardiogram:  Assessment and Plan

## 2011-08-25 NOTE — Patient Instructions (Signed)
Your physician wants you to follow-up in: YEAR WITH DR NISHAN  You will receive a reminder letter in the mail two months in advance. If you don't receive a letter, please call our office to schedule the follow-up appointment.  Your physician recommends that you continue on your current medications as directed. Please refer to the Current Medication list given to you today. 

## 2011-08-25 NOTE — Assessment & Plan Note (Signed)
Maint NSR  With CVA maint coumadin and beta blocker

## 2011-08-25 NOTE — Assessment & Plan Note (Signed)
No fixed PAD.  Keep feet warm.  Improvied this summer

## 2011-09-01 ENCOUNTER — Ambulatory Visit (INDEPENDENT_AMBULATORY_CARE_PROVIDER_SITE_OTHER): Payer: Medicare Other | Admitting: Internal Medicine

## 2011-09-01 ENCOUNTER — Encounter: Payer: Self-pay | Admitting: Internal Medicine

## 2011-09-01 VITALS — BP 132/80 | HR 72 | Temp 98.2°F | Resp 16 | Ht 61.0 in | Wt 112.0 lb

## 2011-09-01 DIAGNOSIS — M19049 Primary osteoarthritis, unspecified hand: Secondary | ICD-10-CM

## 2011-09-01 DIAGNOSIS — I7300 Raynaud's syndrome without gangrene: Secondary | ICD-10-CM

## 2011-09-01 DIAGNOSIS — M5416 Radiculopathy, lumbar region: Secondary | ICD-10-CM

## 2011-09-01 DIAGNOSIS — E039 Hypothyroidism, unspecified: Secondary | ICD-10-CM

## 2011-09-01 DIAGNOSIS — IMO0002 Reserved for concepts with insufficient information to code with codable children: Secondary | ICD-10-CM

## 2011-09-01 DIAGNOSIS — I4891 Unspecified atrial fibrillation: Secondary | ICD-10-CM

## 2011-09-01 NOTE — Patient Instructions (Signed)
Back Exercises Back exercises help treat and prevent back injuries. The goal of back exercises is to increase the strength of your abdominal and back muscles and the flexibility of your back. These exercises should be started when you no longer have back pain. Back exercises include:  Pelvic Tilt. Lie on your back with your knees bent. Tilt your pelvis until the lower part of your back is against the floor. Hold this position 5 to 10 sec and repeat 5 to 10 times.   Knee to Chest. Pull first 1 knee up against your chest and hold for 20 to 30 seconds, repeat this with the other knee, and then both knees. This may be done with the other leg straight or bent, whichever feels better.   Sit-Ups or Curl-Ups. Bend your knees 90 degrees. Start with tilting your pelvis, and do a partial, slow sit-up, lifting your trunk only 30 to 45 degrees off the floor. Take at least 2 to 3 seconds for each sit-up. Do not do sit-ups with your knees out straight. If partial sit-ups are difficult, simply do the above but with only tightening your abdominal muscles and holding it as directed.   Hip-Lift. Lie on your back with your knees flexed 90 degrees. Push down with your feet and shoulders as you raise your hips a couple inches off the floor; hold for 10 seconds, repeat 5 to 10 times.   Back arches. Lie on your stomach, propping yourself up on bent elbows. Slowly press on your hands, causing an arch in your low back. Repeat 3 to 5 times. Any initial stiffness and discomfort should lessen with repetition over time.   Shoulder-Lifts. Lie face down with arms beside your body. Keep hips and torso pressed to floor as you slowly lift your head and shoulders off the floor.  Do not overdo your exercises, especially in the beginning. Exercises may cause you some mild back discomfort which lasts for a few minutes; however, if the pain is more severe, or lasts for more than 15 minutes, do not continue exercises until you see your  caregiver. Improvement with exercise therapy for back problems is slow.  See your caregivers for assistance with developing a proper back exercise program. Document Released: 04/10/2004 Document Revised: 02/20/2011 Document Reviewed: 03/03/2005 [employer] Patient Information 2012 [employer]. 

## 2011-09-01 NOTE — Progress Notes (Signed)
Subjective:    Patient ID: Theresa [name], female    DOB: 13-Sep-1931, 78 y.o.   MRN: [id number]  HPI  Neuropathy vs radiculopathy of right leg Present history of stroke with speech deficit.  History of chronic atrial fibrillation and mitral valve disorder   Review of Systems  Constitutional: Negative for activity change, appetite change and fatigue.  HENT: Negative for ear pain, congestion, neck pain, postnasal drip and sinus pressure.   Eyes: Negative for redness and visual disturbance.  Respiratory: Negative for cough, shortness of breath and wheezing.   Gastrointestinal: Negative for abdominal pain and abdominal distention.  Genitourinary: Negative for dysuria, frequency and menstrual problem.  Musculoskeletal: Negative for myalgias, joint swelling and arthralgias.  Skin: Negative for rash and wound.  Neurological: Negative for dizziness, weakness and headaches.  Hematological: Negative for adenopathy. Does not bruise/bleed easily.  Psychiatric/Behavioral: Negative for disturbed wake/sleep cycle and decreased concentration.   The patient is instructed to continue all medications as prescribed. Schedule followup with check out clerk upon leaving the clinic Past Medical History  Diagnosis Date  . Arrhythmia     AFIB  . Osteoarthritis   . Osteoporosis   . History of postmenopausal HRT   . MVP (mitral valve prolapse)   . Thyroid disease     HYPOTHYROIDISM  . Hx of adenomatous colonic polyps   . Collagenous colitis     History   Social History  . Marital Status: Married    Spouse Name: N/A    Number of Children: N/A  . Years of Education: N/A   Occupational History  . RETIRED    Social History Main Topics  . Smoking status: Former Smoker    Quit date: 04/28/1966  . Smokeless tobacco: Never Used  . Alcohol Use: Yes     DRINKS WINE DAILY  . Drug Use: No  . Sexually Active: Not Currently   Other Topics Concern  . Not on file   Social History Narrative  . No  narrative on file    Past Surgical History  Procedure Date  . Cholecystectomy   . Lumbar fusion   . Tonsillectomy   . Dilation and curettage of uterus   . Cataract extraction   . Mitral valve repair 2008    Family History  Problem Relation Age of Onset  . Cancer Father     COLON  . Stroke Neg Hx     1ST DEGREE RELATIVE <60    Allergies  Allergen Reactions  . Rofecoxib     REACTION: Elevated LFT's    Current Outpatient Prescriptions on File Prior to Visit  Medication Sig Dispense Refill  . Ascorbic Acid (VITAMIN C) 500 MG tablet Take 500 mg by mouth daily.        Marland Kitchen atorvastatin (LIPITOR) 20 MG tablet Take 1 tablet (20 mg total) by mouth daily.  90 tablet  3  . Calcium Carbonate-Vitamin D (CALTRATE 600+D) 600-400 MG-UNIT per tablet Take 1 tablet by mouth 2 (two) times daily.        . Cholecalciferol (VITAMIN D) 2000 UNITS CAPS Take 1 capsule by mouth daily.        . clindamycin (CLEOCIN T) 1 % lotion APPLY AS DIRECTED  60 mL  5  . cyclobenzaprine (FLEXERIL) 5 MG tablet Take 1 tablet (5 mg total) by mouth at bedtime as needed for muscle spasms.  90 tablet  3  . etodolac (LODINE) 400 MG tablet Take 400 mg by mouth 2 (two) times daily.      Marland Kitchen  fish oil-omega-3 fatty acids 1000 MG capsule 2 tabs po qd      . fluticasone (FLONASE) 50 MCG/ACT nasal spray Place 2 sprays into the nose daily.  16 g  6  . FOLBEE 2.5-25-1 MG TABS TAKE ONE TABLET BY MOUTH EVERY DAY  30 each  10  . glucosamine-chondroitin 500-400 MG tablet Take 1 tablet by mouth 2 (two) times daily.        Marland Kitchen levothyroxine (SYNTHROID, LEVOTHROID) 100 MCG tablet Take 1 tablet (100 mcg total) by mouth 1 day or 1 dose.  90 tablet  3  . metoprolol succinate (TOPROL-XL) 100 MG 24 hr tablet TAKE 1 AND 1/2 TABS (150 MG) daily  135 tablet  2  . Multiple Vitamin (MULTIVITAMIN) tablet Take 1 tablet by mouth daily.        Marland Kitchen omeprazole (PRILOSEC) 20 MG capsule Take 1 capsule (20 mg total) by mouth daily.  90 capsule  3  . vitamin A  7500 UNIT capsule Take 7,500 Units by mouth daily.        Marland Kitchen warfarin (COUMADIN) 5 MG tablet Take by mouth daily. Takes 5 mg qd except 2.5 on Monday and thursday       Current Facility-Administered Medications on File Prior to Visit  Medication Dose Route Frequency Provider Last Rate Last Dose  . methylPREDNISolone acetate (DEPO-MEDROL) injection 20 mg  20 mg Intra-articular Once Stacie Glaze, MD        BP 132/80  Pulse 72  Temp 98.2 F (36.8 C)  Resp 16  Ht 5\' 1"  (1.549 m)  Wt 112 lb (50.803 kg)  BMI 21.16 kg/m2        Objective:   Physical Exam  Nursing note and vitals reviewed. Constitutional: She is oriented to person, place, and time. She appears well-developed and well-nourished. No distress.  HENT:  Head: Normocephalic and atraumatic.  Right Ear: External ear normal.  Left Ear: External ear normal.  Nose: Nose normal.  Mouth/Throat: Oropharynx is clear and moist.  Eyes: Conjunctivae and EOM are normal. Pupils are equal, round, and reactive to light.  Neck: Normal range of motion. Neck supple. No JVD present. No tracheal deviation present. No thyromegaly present.  Cardiovascular: Normal rate, regular rhythm, normal heart sounds and intact distal pulses.   No murmur heard. Pulmonary/Chest: Effort normal and breath sounds normal. She has no wheezes. She exhibits no tenderness.  Abdominal: Soft. Bowel sounds are normal.  Musculoskeletal: Normal range of motion. She exhibits no edema and no tenderness.  Lymphadenopathy:    She has no cervical adenopathy.  Neurological: She is alert and oriented to person, place, and time. She has normal reflexes. No cranial nerve deficit.  Skin: Skin is warm and dry. She is not diaphoretic.  Psychiatric: She has a normal mood and affect. Her behavior is normal.          Assessment & Plan:  Stable status post CVA.  Atrial fibrillation stable. The differential diagnosis for her leg numbness includes radiculopathy and neuropathy I  do not believe this is a progression of her stroke.   Slightly this represents a radiculopathy from her back due to her history of scoliosis and disc disease.   We discussed physical therapy for her back and the use of an anti-inflammatory Lodine as well as Flexeril back exercises given to the patient

## 2011-09-02 ENCOUNTER — Ambulatory Visit (INDEPENDENT_AMBULATORY_CARE_PROVIDER_SITE_OTHER)
Admission: RE | Admit: 2011-09-02 | Discharge: 2011-09-02 | Disposition: A | Discharge status: Home / Self Care | Payer: Medicare Other | Source: Ambulatory Visit | Attending: Internal Medicine | Admitting: Internal Medicine

## 2011-09-02 DIAGNOSIS — M5416 Radiculopathy, lumbar region: Secondary | ICD-10-CM

## 2011-09-02 DIAGNOSIS — IMO0002 Reserved for concepts with insufficient information to code with codable children: Secondary | ICD-10-CM

## 2011-09-08 ENCOUNTER — Telehealth: Payer: Self-pay | Admitting: Internal Medicine

## 2011-09-08 DIAGNOSIS — M1990 Unspecified osteoarthritis, unspecified site: Secondary | ICD-10-CM

## 2011-09-08 NOTE — Telephone Encounter (Signed)
Loosened screw- dr Noel Gerold did prior surgery - per dr Lovell Sheehan- go back to dr Noel Gerold

## 2011-09-08 NOTE — Telephone Encounter (Signed)
Patient calling to see if x-ray results are in from last Monday.

## 2011-09-17 ENCOUNTER — Ambulatory Visit (INDEPENDENT_AMBULATORY_CARE_PROVIDER_SITE_OTHER): Payer: Medicare Other | Admitting: Family

## 2011-09-17 DIAGNOSIS — I4891 Unspecified atrial fibrillation: Secondary | ICD-10-CM

## 2011-09-17 DIAGNOSIS — Z8673 Personal history of transient ischemic attack (TIA), and cerebral infarction without residual deficits: Secondary | ICD-10-CM

## 2011-09-17 LAB — POCT INR: INR: 3

## 2011-09-17 NOTE — Patient Instructions (Addendum)
Continue same dose Take 2.5 mg on mondays and thursdays, 5 mg on other days, check in 6 weeks    Latest dosing instructions   Total Sun Mon Tue Wed Thu Fri Sat   30 5 mg 2.5 mg 5 mg 5 mg 2.5 mg 5 mg 5 mg    (5 mg1) (5 mg0.5) (5 mg1) (5 mg1) (5 mg0.5) (5 mg1) (5 mg1)        

## 2011-10-14 ENCOUNTER — Telehealth: Payer: Self-pay | Admitting: Internal Medicine

## 2011-10-14 ENCOUNTER — Ambulatory Visit (INDEPENDENT_AMBULATORY_CARE_PROVIDER_SITE_OTHER): Payer: Medicare Other | Admitting: Family Medicine

## 2011-10-14 ENCOUNTER — Encounter: Payer: Self-pay | Admitting: Family Medicine

## 2011-10-14 VITALS — BP 142/80 | HR 80 | Temp 98.2°F | Wt 113.0 lb

## 2011-10-14 DIAGNOSIS — W57XXXA Bitten or stung by nonvenomous insect and other nonvenomous arthropods, initial encounter: Secondary | ICD-10-CM

## 2011-10-14 DIAGNOSIS — T148XXA Other injury of unspecified body region, initial encounter: Secondary | ICD-10-CM

## 2011-10-14 NOTE — Telephone Encounter (Signed)
Caller: Theresa [name]/Patient; PCP: Darryll Capers; CB#: [phone]; ; ; Call regarding Insect Bite 10/07/11;  black "horse fly" appearing biting fly stung her on the L lateral ankle.  States initially thought it was improving over the course of the week, but notes over past 24 hours,  a rash has appeared around the sting; mildly itchy,  reddened 2" x 2" patch.  Afebrile.  Per protocol, See Within 4 Hours' disposition.  Per Epic, no appt slots available with Dr. Lovell Sheehan or Ms. Orvan Falconer in that time frame.  Info to office for staff/provider review/workin/callback. MAY REACH PATIENT AT [phone].

## 2011-10-14 NOTE — Progress Notes (Signed)
  Subjective:    Patient ID: Theresa [name], female    DOB: 09/09/1931, 78 y.o.   MRN: [id number]  HPI Here to look at her left lower leg for changes after an insect bite that occurred one week ago. She felt a sharp sting that day and saw an insect flying around her leg. At first there was an area of intense inflammation around the bite, and then this started to resolve. The redness and swelling has improved a lot, however several days ago she noticed some redness appearing below the bite. This alarmed her even though there is no pain at all. She has felt fine in general this entire time, no fever or HAs or myalgias.    Review of Systems  Constitutional: Negative.   Skin: Positive for color change.       Objective:   Physical Exam  Constitutional: She appears well-developed and well-nourished.  Skin:       The left lower leg has a tiny bite mark which is slightly swollen but not tender. There is an area of macular erythema below this mark about 4 cm in diameter. No warmth or tenderness.           Assessment & Plan:  The area of redness represents a delayed reaction to the inflammation from the original bite. This seems to be going through the normal stages of healing. No signs of infection. Recheck prn

## 2011-10-14 NOTE — Telephone Encounter (Signed)
Appt scheduled to see Dr. Clent Ridges at 3 pm

## 2011-10-17 ENCOUNTER — Other Ambulatory Visit: Payer: Self-pay | Admitting: Internal Medicine

## 2011-10-29 ENCOUNTER — Ambulatory Visit (INDEPENDENT_AMBULATORY_CARE_PROVIDER_SITE_OTHER): Payer: Medicare Other | Admitting: Family

## 2011-10-29 DIAGNOSIS — Z8673 Personal history of transient ischemic attack (TIA), and cerebral infarction without residual deficits: Secondary | ICD-10-CM

## 2011-10-29 NOTE — Patient Instructions (Signed)
Continue same dose Take 2.5 mg on mondays and thursdays, 5 mg on other days, check in 6 weeks    Latest dosing instructions   Total Sun Mon Tue Wed Thu Fri Sat   30 5 mg 2.5 mg 5 mg 5 mg 2.5 mg 5 mg 5 mg    (5 mg1) (5 mg0.5) (5 mg1) (5 mg1) (5 mg0.5) (5 mg1) (5 mg1)

## 2011-11-10 ENCOUNTER — Encounter: Payer: Self-pay | Admitting: Internal Medicine

## 2011-11-13 ENCOUNTER — Emergency Department (HOSPITAL_COMMUNITY): Payer: Medicare Other

## 2011-11-13 ENCOUNTER — Encounter (HOSPITAL_COMMUNITY): Payer: Self-pay | Admitting: Emergency Medicine

## 2011-11-13 ENCOUNTER — Emergency Department (HOSPITAL_COMMUNITY)
Admission: EM | Admit: 2011-11-13 | Discharge: 2011-11-13 | Disposition: A | Discharge status: Home / Self Care | Payer: Medicare Other | Attending: Emergency Medicine | Admitting: Emergency Medicine

## 2011-11-13 ENCOUNTER — Telehealth: Payer: Self-pay | Admitting: Internal Medicine

## 2011-11-13 ENCOUNTER — Ambulatory Visit: Payer: Medicare Other | Admitting: Internal Medicine

## 2011-11-13 DIAGNOSIS — I10 Essential (primary) hypertension: Secondary | ICD-10-CM

## 2011-11-13 DIAGNOSIS — Z7901 Long term (current) use of anticoagulants: Secondary | ICD-10-CM | POA: Insufficient documentation

## 2011-11-13 DIAGNOSIS — G9389 Other specified disorders of brain: Secondary | ICD-10-CM | POA: Insufficient documentation

## 2011-11-13 DIAGNOSIS — I499 Cardiac arrhythmia, unspecified: Secondary | ICD-10-CM

## 2011-11-13 DIAGNOSIS — R42 Dizziness and giddiness: Secondary | ICD-10-CM | POA: Insufficient documentation

## 2011-11-13 LAB — CBC WITH DIFFERENTIAL/PLATELET
Basophils Absolute: 0.1 10*3/uL (ref 0.0–0.1)
Basophils Relative: 1 % (ref 0–1)
Eosinophils Absolute: 0.2 10*3/uL (ref 0.0–0.7)
Eosinophils Relative: 3 % (ref 0–5)
HCT: 40.7 % (ref 36.0–46.0)
Hemoglobin: 13.9 g/dL (ref 12.0–15.0)
Lymphocytes Relative: 24 % (ref 12–46)
Lymphs Abs: 1.6 10*3/uL (ref 0.7–4.0)
MCH: 32.8 pg (ref 26.0–34.0)
MCHC: 34.2 g/dL (ref 30.0–36.0)
MCV: 96 fL (ref 78.0–100.0)
Monocytes Absolute: 0.5 10*3/uL (ref 0.1–1.0)
Monocytes Relative: 8 % (ref 3–12)
Neutro Abs: 4.4 10*3/uL (ref 1.7–7.7)
Neutrophils Relative %: 65 % (ref 43–77)
Platelets: 231 10*3/uL (ref 150–400)
RBC: 4.24 MIL/uL (ref 3.87–5.11)
RDW: 14 % (ref 11.5–15.5)
WBC: 6.8 10*3/uL (ref 4.0–10.5)

## 2011-11-13 LAB — COMPREHENSIVE METABOLIC PANEL
ALT: 22 U/L (ref 0–35)
AST: 37 U/L (ref 0–37)
Albumin: 4.2 g/dL (ref 3.5–5.2)
Alkaline Phosphatase: 85 U/L (ref 39–117)
BUN: 26 mg/dL — ABNORMAL HIGH (ref 6–23)
CO2: 29 mEq/L (ref 19–32)
Calcium: 10.4 mg/dL (ref 8.4–10.5)
Chloride: 103 mEq/L (ref 96–112)
Creatinine, Ser: 0.89 mg/dL (ref 0.50–1.10)
GFR calc Af Amer: 70 mL/min — ABNORMAL LOW (ref >90)
GFR calc non Af Amer: 60 mL/min — ABNORMAL LOW (ref >90)
Glucose, Bld: 95 mg/dL (ref 70–99)
Potassium: 4 mEq/L (ref 3.5–5.1)
Sodium: 140 mEq/L (ref 135–145)
Total Bilirubin: 0.7 mg/dL (ref 0.3–1.2)
Total Protein: 7.3 g/dL (ref 6.0–8.3)

## 2011-11-13 LAB — PROTIME-INR
INR: 3.46 — ABNORMAL HIGH (ref 0.00–1.49)
Prothrombin Time: 35.3 seconds — ABNORMAL HIGH (ref 11.6–15.2)

## 2011-11-13 NOTE — ED Provider Notes (Signed)
7:41 PM Pt w a stroke earlier this year presents to the ER w cc dizziness. Care resumed from [facility] who's plan is disposition pending MRI results to r/o new onset acute stroke. If MRI findings show no new intracranial abnormality, pt can be dc w f-u with Dr. Modesto Charon. Pt re-evaluated & on exam: BP 146/88  Pulse 94  Temp 98 F (36.7 C) (Oral)  Resp 18  SpO2 98%, NAD, heart w/ RRR, lungs CTAB, Chest & abd non-tender, no peripheral edema or calf tenderness.  MRI IMPRESSION: 1. Remote this encephalomalacia along the posterior left frontal lobe with some remote blood products along the sylvian fissure. 2. Moderate generalized atrophy. 3. No acute intracranial abnormality. 4. Moderate spondylosis of the upper cervical spine with grade I/II anterolisthesis at C3-4 and C4-5.  Spoke with radiologist to confirm that there were no new acute abnormalities. Discussed hx adn presentation and Dr. Alfredo Batty believes images are consistent with previous old infarct that are still improving. There is no evidence of acute injury. Pt has been ambulated and states that her symptoms have improved since being in the ER. As per plan of previous provider pt will be dc with instructions to follow up with her neurologist.    Jaci Carrel, PA-C 11/13/11 1957

## 2011-11-13 NOTE — ED Notes (Signed)
Pt taken to ct 

## 2011-11-13 NOTE — ED Notes (Signed)
Pt here for balance problems last night after sitting for  Awhile watching movie and drinking wine and then she stood up and walked to bed and stumbled from chair to cough. Hx of stroke and speech deficits noted.

## 2011-11-13 NOTE — ED Notes (Signed)
Pt is on coumadin

## 2011-11-13 NOTE — ED Notes (Signed)
Patient is very upset about the waiting time.  States " I have been waiting 2 hours and [facility] has always been like this."  Tried to explain/reassure patient that he will be seen and that he is next to go back to a room.  Patient walked away when I tried to reassure him.  Patient has been pacing the waiting room. Tried to apologize but patient walked away as I was speaking to him

## 2011-11-13 NOTE — ED Provider Notes (Signed)
History     CSN: [id number]  Arrival date & time 11/13/11  1026   First MD Initiated Contact with Patient 11/13/11 1347      Chief Complaint  Patient presents with  . Dizziness    (Consider location/radiation/quality/duration/timing/severity/associated sxs/prior treatment) HPI Comments: Patient with h/o stroke earlier this year, was hospitalized in [place], [place], presented with aphasia at that time -- presents today. She states that over the past few days she has felt "wobbly" when she walks. This has been intermittent and short-lived. Last night while the patient was watching TV she became acutely dizzy and states that she could not stand up from the couch and walk. Symptoms did not change with position. Patient states that for about 5 minutes she noticed a change in her disequilibrium. She denies a spinning sensation or feeling lightheaded. This gradually resolved back to her baseline. Patient presents today for evaluation of this. She denies confusion, trouble talking, weakness in her arms or her legs, facial droop, numbness/tingling/weakness in her extremities or face. She denies falling or hitting her head. She denies headache. Patient denies other symptoms of illness. Onset acute. Course is resolved. Nothing makes symptoms better worse.  The history is provided by the patient and the spouse.    Past Medical History  Diagnosis Date  . Arrhythmia     AFIB  . Osteoarthritis   . Osteoporosis   . History of postmenopausal HRT   . MVP (mitral valve prolapse)   . Thyroid disease     HYPOTHYROIDISM  . Hx of adenomatous colonic polyps   . Collagenous colitis     Past Surgical History  Procedure Date  . Cholecystectomy   . Lumbar fusion   . Tonsillectomy   . Dilation and curettage of uterus   . Cataract extraction   . Mitral valve repair 2008    Family History  Problem Relation Age of Onset  . Cancer Father     COLON  . Stroke Neg Hx     1ST DEGREE RELATIVE <60     History  Substance Use Topics  . Smoking status: Former Smoker    Quit date: 04/28/1966  . Smokeless tobacco: Never Used  . Alcohol Use: 3.5 oz/week    7 drink(s) per week    OB History    Grav Para Term Preterm Abortions TAB SAB Ect Mult Living                  Review of Systems  Constitutional: Negative for fever.  HENT: Negative for sore throat and rhinorrhea.   Eyes: Negative for redness.  Respiratory: Negative for cough.   Cardiovascular: Negative for chest pain.  Gastrointestinal: Negative for nausea, vomiting, abdominal pain and diarrhea.  Genitourinary: Negative for dysuria.  Musculoskeletal: Positive for gait problem. Negative for myalgias.  Skin: Negative for rash.  Neurological: Positive for dizziness. Negative for facial asymmetry, speech difficulty, light-headedness, numbness and headaches.    Allergies  Rofecoxib  Home Medications   Current Outpatient Rx  Name Route Sig Dispense Refill  . VITAMIN C 500 MG PO TABS Oral Take 500 mg by mouth daily.      . ATORVASTATIN CALCIUM 20 MG PO TABS Oral Take 1 tablet (20 mg total) by mouth daily. 90 tablet 3  . CALCIUM CARBONATE-VITAMIN D 600-400 MG-UNIT PO TABS Oral Take 1 tablet by mouth 2 (two) times daily.      Marland Kitchen VITAMIN D 2000 UNITS PO CAPS Oral Take 1 capsule by mouth  daily.      Marland Kitchen CLINDAMYCIN PHOSPHATE 1 % EX LOTN Topical Apply 1 application topically at bedtime. Apply to face    . CYCLOBENZAPRINE HCL 5 MG PO TABS Oral Take 1 tablet (5 mg total) by mouth at bedtime as needed for muscle spasms. 90 tablet 3  . ETODOLAC 400 MG PO TABS Oral Take 400 mg by mouth 2 (two) times daily.    . OMEGA-3 FATTY ACIDS 1000 MG PO CAPS Oral Take 1 g by mouth 2 (two) times daily.     Marland Kitchen FLUTICASONE PROPIONATE 50 MCG/ACT NA SUSP Nasal Place 2 sprays into the nose daily. 16 g 6  . FOLIC ACID-VIT B6-VIT B12 2.5-25-1 MG PO TABS Oral Take 1 tablet by mouth daily.    Marland Kitchen GLUCOSAMINE-CHONDROITIN 500-400 MG PO TABS Oral Take 1 tablet by  mouth 2 (two) times daily.      Marland Kitchen LEVOTHYROXINE SODIUM 100 MCG PO TABS Oral Take 100 mcg by mouth daily.    Marland Kitchen METOPROLOL SUCCINATE ER 100 MG PO TB24 Oral Take 150 mg by mouth daily. Take with or immediately following a meal.    . ONE-DAILY MULTI VITAMINS PO TABS Oral Take 1 tablet by mouth daily.      Marland Kitchen OMEPRAZOLE 20 MG PO CPDR Oral Take 1 capsule (20 mg total) by mouth daily. 90 capsule 3  . VITAMIN A 8000 UNITS PO CAPS Oral Take 8,000 Units by mouth daily.    . WARFARIN SODIUM 5 MG PO TABS Oral Take 2.5-5 mg by mouth daily. Takes 5mg  daily except 2.5mg  on Monday and thursday      BP 149/107  Pulse 117  Temp 98.1 F (36.7 C) (Oral)  Resp 16  SpO2 99%  Physical Exam  Nursing note and vitals reviewed. Constitutional: She is oriented to person, place, and time. She appears well-developed and well-nourished.  HENT:  Head: Normocephalic and atraumatic.  Right Ear: Tympanic membrane, external ear and ear canal normal.  Left Ear: Tympanic membrane, external ear and ear canal normal.  Nose: Nose normal.  Mouth/Throat: Uvula is midline, oropharynx is clear and moist and mucous membranes are normal. Mucous membranes are not dry.  Eyes: Conjunctivae, EOM and lids are normal. Pupils are equal, round, and reactive to light. Right eye exhibits no discharge. Left eye exhibits no discharge. Right eye exhibits no nystagmus. Left eye exhibits no nystagmus.  Neck: Trachea normal and normal range of motion. Neck supple. Normal carotid pulses and no JVD present. No muscular tenderness present. Carotid bruit is not present. No tracheal deviation present.  Cardiovascular: S1 normal, S2 normal, normal heart sounds, intact distal pulses and normal pulses.  An irregularly irregular rhythm present. Frequent extrasystoles are present. Tachycardia present.  Exam reveals no decreased pulses.   No murmur heard. Pulmonary/Chest: Effort normal and breath sounds normal. No respiratory distress. She has no wheezes. She  exhibits no tenderness.  Abdominal: Soft. Normal aorta and bowel sounds are normal. There is no tenderness. There is no rebound and no guarding.  Musculoskeletal: Normal range of motion.       Cervical back: She exhibits normal range of motion, no tenderness and no bony tenderness.       Thoracic back: She exhibits no tenderness and no bony tenderness.       Lumbar back: She exhibits no tenderness and no bony tenderness.  Neurological: She is alert and oriented to person, place, and time. She has normal strength and normal reflexes. No cranial nerve deficit or  sensory deficit. She displays a negative Romberg sign. Coordination normal. GCS eye subscore is 4. GCS verbal subscore is 5. GCS motor subscore is 6.  Skin: Skin is warm and dry. She is not diaphoretic. No cyanosis. No pallor.  Psychiatric: She has a normal mood and affect.    ED Course  Procedures (including critical care time)  Labs Reviewed  COMPREHENSIVE METABOLIC PANEL - Abnormal; Notable for the following:    BUN 26 (*)     GFR calc non Af Amer 60 (*)     GFR calc Af Amer 70 (*)     All other components within normal limits  PROTIME-INR - Abnormal; Notable for the following:    Prothrombin Time 35.3 (*)     INR 3.46 (*)     All other components within normal limits  CBC WITH DIFFERENTIAL   Ct Head Wo Contrast  11/13/2011  *RADIOLOGY REPORT*  Clinical Data: Imbalance.  Evaluate for stroke.  CT HEAD WITHOUT CONTRAST  Technique:  Contiguous axial images were obtained from the base of the skull through the vertex without contrast.  Comparison: None.  Findings: There is no evidence for acute infarction, intracranial hemorrhage, mass lesion, hydrocephalus, or extra-axial fluid.  Mild atrophy is present.  Slight chronic microvascular ischemic change. Calvarium intact.  Vascular calcification.  Clear sinuses and mastoids.  IMPRESSION: Chronic changes as described.  No skull fracture or intracranial hemorrhage.   Original Report  Authenticated By: Elsie Stain, M.D.      1. Dizziness, nonspecific     2:06 PM Patient seen and examined. Work-up initiated.   Vital signs reviewed and are as follows: Filed Vitals:   11/13/11 1038  BP: 149/107  Pulse: 117  Temp: 98.1 F (36.7 C)  Resp: 16   Patient seen by and discussed with Dr. Juleen China. Will attempt to move patient to CDU.   4:53 PM Handoff to both Earlie Lou and Katrinka Blazing NP in CDU. They will follow-up on MRI results and d/c to home if negative.   MDM  Patient with episode of disequilibrium yesterday, now resolved. Patient has a history of strokes. She is appropriately anticoagulated, actually mildly supratherapeutic today. CT head is negative. Given concern for possible posterior TIA/stroke, MRI has been ordered and is pending. If this is negative the patient can be discharged home with followup with her primary care physician. Workup was otherwise unrevealing.        Renne Crigler, [place] 11/13/11 1731

## 2011-11-13 NOTE — ED Notes (Signed)
Had been watching a movie last night  and got up and got really dizzy then it went away lasted 10 mins( also had an episode on Sunday felt fine after). Today was dizzy but it has not gone away bp was high this am she states  No new numbness or tingling she states  Had numbness from  Rt hand fingers from last stroke

## 2011-11-13 NOTE — Telephone Encounter (Signed)
Caller: [name]/Patient; Patient Name: Theresa [name]; PCP: Darryll Capers (Adults only); Best Callback Phone Number: [phone].  Called re episodes of jittery/shakiness. Onset 11/09/11. Afebrile. Noted loss of balance 11/12/11 at 2200 when got up from watching TV for > 5 minutes before it improved.  No loss of balance this morning but continues to feel jittery.  BP was "low" but unable to recall with pulse of 65.  BP 171/138 L arm/ sitting,  P 105 now at 0926. Denies chest pain.  Advised to see MD now for pulse rate is suddenly more than 90 beats/minutes and known coronary artery disease per Irregular Heartbeat guideline.  Dr Lovell Sheehan has no appointments.  PA Orvan Falconer out of office.  Appointment scheduled with Dr Amador Cunas at [facility] 11/13/11. Called office/Holly for permission to send to ED instead of office  appointment for tachycardia with hypertension and recent dizziness. Per Bonnie/office nurse due to pt history of recent CVA,  advised to go to ED now and 1015 appointment cancelled.

## 2011-11-14 ENCOUNTER — Telehealth: Payer: Self-pay | Admitting: Internal Medicine

## 2011-11-14 ENCOUNTER — Encounter: Payer: Self-pay | Admitting: Family Medicine

## 2011-11-14 ENCOUNTER — Ambulatory Visit (INDEPENDENT_AMBULATORY_CARE_PROVIDER_SITE_OTHER): Payer: Medicare Other | Admitting: Family Medicine

## 2011-11-14 VITALS — BP 132/80 | HR 132 | Temp 97.9°F | Wt 111.0 lb

## 2011-11-14 DIAGNOSIS — I4891 Unspecified atrial fibrillation: Secondary | ICD-10-CM

## 2011-11-14 DIAGNOSIS — R42 Dizziness and giddiness: Secondary | ICD-10-CM

## 2011-11-14 DIAGNOSIS — Z8673 Personal history of transient ischemic attack (TIA), and cerebral infarction without residual deficits: Secondary | ICD-10-CM

## 2011-11-14 NOTE — ED Provider Notes (Signed)
Medical screening examination/treatment/procedure(s) were performed by non-physician practitioner and as supervising physician I was immediately available for consultation/collaboration.  Jones Skene, M.D.     Jones Skene, MD 11/14/11 [phone]

## 2011-11-14 NOTE — Progress Notes (Signed)
  Subjective:    Patient ID: Theresa [name], female    DOB: Jan 26, 1932, 78 y.o.   MRN: [id number]  HPI Here for a follow up to an ER visit yesterday for some dizziness she had experienced several days ago. No chest pain or SOB. She has had some recent strokes so so went to the ER for this. She had a head CT and a brain MRI which showed the old lesions but no acute lesions. She has atrial fibrillation and is on Coumadin. Her BP on arrival to the ER was 149/107 with a pulse of 117. On her DC home BP was 146/88 with a pulse of 94. No changes to her medications were made. She was told to follow up with Dr. Lovell Sheehan. She has felt fine since coming home but she is worried about the elevated BPs.   Review of Systems  Constitutional: Negative.   Respiratory: Negative.   Cardiovascular: Negative.   Neurological: Negative.        Objective:   Physical Exam  Constitutional: She is oriented to person, place, and time. She appears well-developed and well-nourished. No distress.  Cardiovascular: Normal heart sounds and intact distal pulses.   No murmur heard.      Irregular rhythm, rapid rate   Pulmonary/Chest: Effort normal and breath sounds normal.  Lymphadenopathy:    She has no cervical adenopathy.  Neurological: She is alert and oriented to person, place, and time. She has normal reflexes. No cranial nerve deficit. She exhibits normal muscle tone. Coordination normal.          Assessment & Plan:  She seems to be doing well although her ventricular rate and BP is a little high. She will increase her Metoprolol to 2 whole tablets a day (total of 200 mg) and follow up with Cardiology as scheduled on 11-24-11.

## 2011-11-14 NOTE — Telephone Encounter (Signed)
Caller: [name]/Patient; Patient Name: Theresa [name]; PCP: Darryll Capers (Adults only); Best Callback Phone Number: [phone].  Call regarding Blood Pressure Check after visiting Emergency Room on 8-29.  No meds given for treatment.  Patient advised to follow up with Dr Lovell Sheehan asap. Patient had dizziness, jittery on 8-29 that took her to Emergency Room.  Patient denies any symptoms at time of call.  All emergent symptoms ruled out per Hyptertension Protocol.  Appointment scheduled with Dr Clent Ridges at 1515 on 8-30 due to PA [facility] not available and no appointments with Dr Lovell Sheehan.  Patient verbalized understanding.

## 2011-11-22 NOTE — ED Provider Notes (Signed)
Medical screening examination/treatment/procedure(s) were conducted as a shared visit with non-physician practitioner(s) and myself.  I personally evaluated the patient during the encounter.  Patient with some difficulty with balance. She doesn't really describe vertigo or dizziness. Concern for central etiology although cerebellar exam is normal. CT is unremarkable. Patient is actually anticoagulated and INR slightly supratherapeutic. Will MR to further evaluate.  Raeford Razor, MD 11/22/11 [phone]

## 2011-11-24 ENCOUNTER — Ambulatory Visit (INDEPENDENT_AMBULATORY_CARE_PROVIDER_SITE_OTHER): Payer: Medicare Other | Admitting: Physician Assistant

## 2011-11-24 ENCOUNTER — Encounter: Payer: Self-pay | Admitting: Physician Assistant

## 2011-11-24 VITALS — BP 140/90 | HR 117 | Ht 61.0 in | Wt 113.0 lb

## 2011-11-24 DIAGNOSIS — Z8673 Personal history of transient ischemic attack (TIA), and cerebral infarction without residual deficits: Secondary | ICD-10-CM

## 2011-11-24 DIAGNOSIS — I4891 Unspecified atrial fibrillation: Secondary | ICD-10-CM

## 2011-11-24 DIAGNOSIS — I059 Rheumatic mitral valve disease, unspecified: Secondary | ICD-10-CM

## 2011-11-24 MED ORDER — METOPROLOL SUCCINATE ER 100 MG PO TB24: ORAL_TABLET | ORAL | Status: DC

## 2011-11-24 NOTE — Patient Instructions (Addendum)
Your physician has recommended you make the following change in your medication: CHANGE TOPROL XL 100 MG TAKE 1 AND 1/2 (HALF) = 150 MG TWICE DAILY  Your physician has requested that you have an echocardiogram DX MITRAL VALVE DISORDER, A-FIB 11/26/11 @ 9:30 AM. Echocardiography is a painless test that uses sound waves to create images of your heart. It provides your doctor with information about the size and shape of your heart and how well your heart's chambers and valves are working. This procedure takes approximately one hour. There are no restrictions for this procedure.   Your physician recommends that you schedule a follow-up appointment in: 12/16/11 @ 11 AM WITH DR. Eden Emms

## 2011-11-24 NOTE — Addendum Note (Signed)
Addended by: Tarri Fuller on: 11/24/2011 04:45 PM   Modules accepted: Orders

## 2011-11-24 NOTE — Progress Notes (Signed)
[address] [place], [place]  [address] Phone: [phone] Fax:  [phone]  Date:  11/24/2011   Name:  Theresa [name]   DOB:  01-18-1932   MRN:  [id number]  PCP:  Carrie Mew, MD  Primary Cardiologist:  Dr. [name]  Primary Electrophysiologist:  None    History of Present Illness: Theresa [name] is a 78 y.o. female who returns for follow up on AFib.  She has a history of mitral regurgitation, status post mitral valve repair at [facility] in 2008, paroxysmal atrial fibrillation, prior stroke in 12/12 at [facility] (felt to be embolic), on chronic Coumadin therapy, colitis, Raynaud's.  She was seen in the emergency room 11/13/11 with dizziness. Head CT demonstrated atrophy and chronic microvascular ischemic changes, no acute findings. MRI did not show anything acute.  EKG demonstrated atrial fibrillation with a heart rate of 122. She did see her PCP in followup who adjusted her Toprol for rate control. She denies any further dizziness since going to the emergency room. She describes the dizziness as a feeling of imbalance. She denies a spinning sensation. She denies syncope or near-syncope. She denies significant dyspnea. She denies chest pain. She denies orthopnea, PND or edema.   Past Medical History  Diagnosis Date  . Osteoarthritis   . Osteoporosis   . History of postmenopausal HRT   . MVP (mitral valve prolapse)     a. s/p MV repair at [facility] in 2008;  b. Echocardiogram 7/12: EF 50%, status post mitral valve repair with mild MR and minimal MS, mean gradient 4, moderate LAE, LA diam 55 mm; mild RAE, PASP 28-32;   Marland Kitchen Hypothyroidism   . Hx of adenomatous colonic polyps   . Collagenous colitis   . Atrial fibrillation     AFIB  . CVA (cerebral vascular accident)     a.  L parietal CVA by MRI 12/12, likely embolic;  b.  TEE by report with neg bubble study;  c.  carotid dopplers  12/12:  + plaque, no sig ICA stenosis     Current  Outpatient Prescriptions  Medication Sig Dispense Refill  . Ascorbic Acid (VITAMIN C) 500 MG tablet Take 500 mg by mouth daily.        Marland Kitchen atorvastatin (LIPITOR) 20 MG tablet Take 1 tablet (20 mg total) by mouth daily.  90 tablet  3  . Calcium Carbonate-Vitamin D (CALTRATE 600+D) 600-400 MG-UNIT per tablet Take 1 tablet by mouth 2 (two) times daily.        . Cholecalciferol (VITAMIN D) 2000 UNITS CAPS Take 1 capsule by mouth daily.        . clindamycin (CLEOCIN T) 1 % lotion Apply 1 application topically at bedtime. Apply to face      . cyclobenzaprine (FLEXERIL) 5 MG tablet Take 1 tablet (5 mg total) by mouth at bedtime as needed for muscle spasms.  90 tablet  3  . etodolac (LODINE) 400 MG tablet Take 400 mg by mouth 2 (two) times daily.      . fish oil-omega-3 fatty acids 1000 MG capsule Take 1 g by mouth 2 (two) times daily.       . fluticasone (FLONASE) 50 MCG/ACT nasal spray Place 2 sprays into the nose daily.  16 g  6  . Folic Acid-Vit B6-Vit B12 (FOLBEE) 2.5-25-1 MG TABS Take 1 tablet by mouth daily.      Marland Kitchen glucosamine-chondroitin 500-400 MG tablet Take 1 tablet by mouth  2 (two) times daily.        Marland Kitchen levothyroxine (SYNTHROID, LEVOTHROID) 100 MCG tablet Take 100 mcg by mouth daily.      . metoprolol succinate (TOPROL-XL) 100 MG 24 hr tablet Take 200 mg by mouth daily. Take with or immediately following a meal.      . Multiple Vitamin (MULTIVITAMIN) tablet Take 1 tablet by mouth daily.        Marland Kitchen omeprazole (PRILOSEC) 20 MG capsule Take 1 capsule (20 mg total) by mouth daily.  90 capsule  3  . vitamin A 8000 UNIT capsule Take 8,000 Units by mouth daily.      Marland Kitchen warfarin (COUMADIN) 5 MG tablet Take 2.5-5 mg by mouth daily. Takes 5mg  daily except 2.5mg  on Monday and thursday       Current Facility-Administered Medications  Medication Dose Route Frequency Provider Last Rate Last Dose  . methylPREDNISolone acetate (DEPO-MEDROL) injection 20 mg  20 mg Intra-articular Once Stacie Glaze, MD         Allergies: Allergies  Allergen Reactions  . Rofecoxib     REACTION: Elevated LFT's    History  Substance Use Topics  . Smoking status: Former Smoker    Quit date: 04/28/1966  . Smokeless tobacco: Never Used  . Alcohol Use: 3.5 oz/week    7 drink(s) per week     ROS:  Please see the history of present illness.   No bleeding problems.  All other systems reviewed and negative.   PHYSICAL EXAM: VS:  BP 140/90  Pulse 117  Ht 5\' 1"  (1.549 m)  Wt 113 lb (51.256 kg)  BMI 21.35 kg/m2 Well nourished, well developed, in no acute distress HEENT: normal Neck: no JVD Cardiac:  normal S1, S2; rapid irregular rhythm Lungs:  clear to auscultation bilaterally, no wheezing, rhonchi or rales Abd: soft, nontender, no hepatomegaly Ext: no edema Skin: warm and dry Neuro:  CNs 2-12 intact, no focal abnormalities noted  EKG:  Atrial fibrillation, normal axis, heart rate 102      ASSESSMENT AND PLAN:  1. Atrial Fibrillation:   Lab Results  Component Value Date   INR 3.46* 11/13/2011   INR 2.9 10/29/2011   INR 3.0 09/17/2011    Rate is uncontrolled.  She remains on Coumadin. Her INR has been therapeutic going back several months. I reviewed her case today with Dr. Johney Frame (DOD). We discussed the possibility of proceeding with cardioversion. Given her enlarged left atrium, the likelihood she would remain in sinus rhythm a small. He suggested continuing with rate control therapy and anticoagulation. Antiarrhythmic options would include amiodarone or dofetilide. Initially, I will increase her Toprol XL to 150 mg every 12 hours. I will also arrange a followup echocardiogram. She will followup with Dr. Eden Emms in 3-4 weeks.  2. Mitral Regurgitation, Status Post Mitral Valve Repair: Obtain followup echocardiogram as noted.  3. History of Stroke: She remains on Coumadin as noted. Recent MRI negative for recent stroke.  4. Hypertension: Blood pressures are elevated. Adjust Toprol as  noted.  Signed, Tereso Newcomer, PA-C  3:40 PM 11/24/2011

## 2011-11-25 ENCOUNTER — Telehealth: Payer: Self-pay | Admitting: Cardiovascular Disease

## 2011-11-25 NOTE — Telephone Encounter (Signed)
Pt called re pt instruction sheet . PT WAS REVIEWING  INSTRUCTIONS AND NOTED  UNDER FACILITY ADMINISTERED MEDS HAD LISTED  INJECTION OF METHYLPREDNISOLONE PT DID NOT HAVE DONE  DISCUSSED WITH SALLY PUTT PAC  ,SAID MED WAS ON MEDICATION LIST AND THAT IS WHY  IT SHOWED UP  MED REMOVED  AND PT AWARE .Zack Seal

## 2011-11-25 NOTE — Telephone Encounter (Signed)
New problem:  Was seen on yesterday by Tereso Newcomer, questioning why it was mark on paper that she had an injection. Patient states she did not.

## 2011-11-25 NOTE — Addendum Note (Signed)
Addended by: Scherrie Bateman E on: 11/25/2011 10:46 AM   Modules accepted: Orders

## 2011-11-26 ENCOUNTER — Ambulatory Visit (HOSPITAL_COMMUNITY): Payer: Medicare Other | Attending: Cardiology

## 2011-11-26 DIAGNOSIS — I4891 Unspecified atrial fibrillation: Secondary | ICD-10-CM | POA: Insufficient documentation

## 2011-11-26 DIAGNOSIS — Z87891 Personal history of nicotine dependence: Secondary | ICD-10-CM | POA: Insufficient documentation

## 2011-11-26 DIAGNOSIS — I059 Rheumatic mitral valve disease, unspecified: Secondary | ICD-10-CM | POA: Insufficient documentation

## 2011-11-26 DIAGNOSIS — I379 Nonrheumatic pulmonary valve disorder, unspecified: Secondary | ICD-10-CM | POA: Insufficient documentation

## 2011-11-26 DIAGNOSIS — I369 Nonrheumatic tricuspid valve disorder, unspecified: Secondary | ICD-10-CM | POA: Insufficient documentation

## 2011-11-26 NOTE — Progress Notes (Signed)
Echocardiogram performed.  

## 2011-11-28 ENCOUNTER — Telehealth: Payer: Self-pay | Admitting: *Deleted

## 2011-11-28 NOTE — Telephone Encounter (Signed)
Message copied by Tarri Fuller on Fri Nov 28, 2011 12:40 PM ------      Message from: [place], [place] T      Created: Thu Nov 27, 2011  2:56 PM       EF unchanged since prior echo (normal).      Left Atrium mod to severely enlarged.        Continue with current treatment plan.      Tereso Newcomer, PA-C  2:55 PM 11/27/2011

## 2011-11-28 NOTE — Telephone Encounter (Signed)
pt notified about echo w/verbal understanding

## 2011-12-02 ENCOUNTER — Ambulatory Visit (INDEPENDENT_AMBULATORY_CARE_PROVIDER_SITE_OTHER): Payer: Medicare Other | Admitting: Internal Medicine

## 2011-12-02 ENCOUNTER — Encounter: Payer: Self-pay | Admitting: Internal Medicine

## 2011-12-02 VITALS — BP 152/80 | HR 88 | Temp 98.2°F | Resp 16 | Ht <= 58 in | Wt 113.0 lb

## 2011-12-02 DIAGNOSIS — Z23 Encounter for immunization: Secondary | ICD-10-CM

## 2011-12-02 DIAGNOSIS — E039 Hypothyroidism, unspecified: Secondary | ICD-10-CM

## 2011-12-02 DIAGNOSIS — R55 Syncope and collapse: Secondary | ICD-10-CM

## 2011-12-02 DIAGNOSIS — M419 Scoliosis, unspecified: Secondary | ICD-10-CM

## 2011-12-02 DIAGNOSIS — Z8679 Personal history of other diseases of the circulatory system: Secondary | ICD-10-CM

## 2011-12-02 DIAGNOSIS — I4891 Unspecified atrial fibrillation: Secondary | ICD-10-CM

## 2011-12-02 DIAGNOSIS — M4120 Other idiopathic scoliosis, site unspecified: Secondary | ICD-10-CM

## 2011-12-02 LAB — T4, FREE: Free T4: 1.74 ng/dL — ABNORMAL HIGH (ref 0.60–1.60)

## 2011-12-02 MED ORDER — TRAMADOL HCL 50 MG PO TABS: 50.0000 mg | ORAL_TABLET | Freq: Three times a day (TID) | ORAL | Status: DC | PRN

## 2011-12-02 NOTE — Patient Instructions (Addendum)
The patient is instructed to continue all medications as prescribed. Schedule followup with check out clerk upon leaving the clinic  

## 2011-12-02 NOTE — Progress Notes (Signed)
Subjective:    Patient ID: Theresa [name], female    DOB: 05/28/1931, 78 y.o.   MRN: [id number]  HPI  Follow up "syncopal" or dizzy episodes post acute care visit to the ER with lads, echo and MRI. Pt felt dizzy and noted drift to the left with near falling due to  The pt is on toprol 150 mg BID  Review of Systems  Constitutional: Negative for activity change, appetite change and fatigue.  HENT: Positive for congestion. Negative for ear pain, neck pain, postnasal drip and sinus pressure.   Eyes: Negative for redness and visual disturbance.  Respiratory: Negative for cough, shortness of breath and wheezing.   Cardiovascular: Positive for palpitations.  Gastrointestinal: Negative for abdominal pain and abdominal distention.  Genitourinary: Negative for dysuria, frequency and menstrual problem.  Musculoskeletal: Negative for myalgias, joint swelling and arthralgias.  Skin: Negative for rash and wound.  Neurological: Positive for weakness. Negative for dizziness and headaches.  Hematological: Negative for adenopathy. Does not bruise/bleed easily.  Psychiatric/Behavioral: Negative for disturbed wake/sleep cycle and decreased concentration.   Past Medical History  Diagnosis Date  . Osteoarthritis   . Osteoporosis   . History of postmenopausal HRT   . MVP (mitral valve prolapse)     a. s/p MV repair at [facility] in 2008;  b. Echocardiogram 7/12: EF 50%, status post mitral valve repair with mild MR and minimal MS, mean gradient 4, moderate LAE, LA diam 55 mm; mild RAE, PASP 28-32;   Marland Kitchen Hypothyroidism   . Hx of adenomatous colonic polyps   . Collagenous colitis   . Atrial fibrillation     AFIB  . CVA (cerebral vascular accident)     a.  L parietal CVA by MRI 12/12, likely embolic;  b.  TEE by report with neg bubble study;  c.  carotid dopplers  12/12:  + plaque, no sig ICA stenosis     History   Social History  . Marital Status: Married    Spouse Name: N/A    Number of Children: N/A    . Years of Education: N/A   Occupational History  . RETIRED    Social History Main Topics  . Smoking status: Former Smoker    Quit date: 04/28/1966  . Smokeless tobacco: Never Used  . Alcohol Use: 3.5 oz/week    7 drink(s) per week  . Drug Use: No  . Sexually Active: Not Currently   Other Topics Concern  . Not on file   Social History Narrative  . No narrative on file    Past Surgical History  Procedure Date  . Cholecystectomy   . Lumbar fusion   . Tonsillectomy   . Dilation and curettage of uterus   . Cataract extraction   . Mitral valve repair 2008    Family History  Problem Relation Age of Onset  . Cancer Father     COLON  . Stroke Neg Hx     1ST DEGREE RELATIVE <60    Allergies  Allergen Reactions  . Rofecoxib     REACTION: Elevated LFT's    Current Outpatient Prescriptions on File Prior to Visit  Medication Sig Dispense Refill  . Ascorbic Acid (VITAMIN C) 500 MG tablet Take 500 mg by mouth daily.        Marland Kitchen atorvastatin (LIPITOR) 20 MG tablet Take 1 tablet (20 mg total) by mouth daily.  90 tablet  3  . Calcium Carbonate-Vitamin D (CALTRATE 600+D) 600-400 MG-UNIT per tablet Take 1 tablet  by mouth 2 (two) times daily.        . Cholecalciferol (VITAMIN D) 2000 UNITS CAPS Take 1 capsule by mouth daily.        . clindamycin (CLEOCIN T) 1 % lotion Apply 1 application topically at bedtime. Apply to face      . cyclobenzaprine (FLEXERIL) 5 MG tablet Take 1 tablet (5 mg total) by mouth at bedtime as needed for muscle spasms.  90 tablet  3  . etodolac (LODINE) 400 MG tablet Take 400 mg by mouth 2 (two) times daily.      . fish oil-omega-3 fatty acids 1000 MG capsule Take 1 g by mouth 2 (two) times daily.       . fluticasone (FLONASE) 50 MCG/ACT nasal spray Place 2 sprays into the nose daily.  16 g  6  . Folic Acid-Vit B6-Vit B12 (FOLBEE) 2.5-25-1 MG TABS Take 1 tablet by mouth daily.      Marland Kitchen glucosamine-chondroitin 500-400 MG tablet Take 1 tablet by mouth 2 (two)  times daily.        Marland Kitchen levothyroxine (SYNTHROID, LEVOTHROID) 100 MCG tablet Take 100 mcg by mouth daily.      . metoprolol succinate (TOPROL-XL) 100 MG 24 hr tablet TAKE 1 AND 1/2 (HALF) TABLETS = 150 MG TWICE DAILY  180 tablet  3  . Multiple Vitamin (MULTIVITAMIN) tablet Take 1 tablet by mouth daily.        Marland Kitchen omeprazole (PRILOSEC) 20 MG capsule Take 1 capsule (20 mg total) by mouth daily.  90 capsule  3  . vitamin A 8000 UNIT capsule Take 8,000 Units by mouth daily.      Marland Kitchen warfarin (COUMADIN) 5 MG tablet Take 2.5-5 mg by mouth daily. Takes 5mg  daily except 2.5mg  on Monday and thursday        BP 152/80  Pulse 88  Temp 98.2 F (36.8 C)  Resp 16  Ht 4' (1.219 m)  Wt 113 lb (51.256 kg)  BMI 34.48 kg/m2       Objective:   Physical Exam  Nursing note and vitals reviewed. Constitutional: She is oriented to person, place, and time. She appears well-developed and well-nourished. No distress.  HENT:  Head: Normocephalic and atraumatic.  Right Ear: External ear normal.  Left Ear: External ear normal.  Nose: Nose normal.  Mouth/Throat: Oropharynx is clear and moist.  Eyes: Conjunctivae normal and EOM are normal. Pupils are equal, round, and reactive to light.  Neck: Normal range of motion. Neck supple. No JVD present. No tracheal deviation present. No thyromegaly present.  Cardiovascular: Intact distal pulses.   Murmur heard. Pulmonary/Chest: Effort normal and breath sounds normal. She has no wheezes. She exhibits no tenderness.  Abdominal: Soft. Bowel sounds are normal.  Musculoskeletal: Normal range of motion. She exhibits no edema and no tenderness.  Lymphadenopathy:    She has no cervical adenopathy.  Neurological: She is alert and oriented to person, place, and time. She has normal reflexes. No cranial nerve deficit.  Skin: Skin is warm and dry. She is not diaphoretic.  Psychiatric: She has a normal mood and affect. Her behavior is normal.          Assessment & Plan:    Status post mitral bowel prepare with dilated left atrium and chronic atrial fibrillation.  The syncopal episodes that she has been experiencing appear to be related to rate and did not have an apparent and neurologic etiology.  MRI did not show any evidence of acute stroke.  She is in rapid A. fib today and I believe that she may need a different approach with an antiarrhythmic than just the metoprolol since increasing the Lopressor to 300 mg total daily dose has not resulted in a significant rate control. I reviewed the findings of the emergency room and the echo MRI and CT scans as well as laboratory values.  She was slightly over 3.5 on the INR but she does have valvular repair and atrial fibrillation There is no evidence of electrolyte abnormality or anemia  I think we should consider a referral to an EP specialist to consider amiodarone?  The patient has multifactorial gait and balance issues from a combination of the prior stroke scoliosis of the spine.  I think she would benefit from physical therapy especially with balance issues Monitor  tsh, t3 f and t12f

## 2011-12-08 NOTE — Progress Notes (Signed)
Quick Note:  Called and spoke with pt and pt is aware of results. Pt states she just ordered 100 mcg from [facility] and wants to know how she should cut these pills. Pls advise. ______

## 2011-12-09 ENCOUNTER — Other Ambulatory Visit: Payer: Self-pay | Admitting: *Deleted

## 2011-12-09 MED ORDER — LEVOTHYROXINE SODIUM 88 MCG PO TABS: 88.0000 ug | ORAL_TABLET | Freq: Every day | ORAL | Status: DC

## 2011-12-10 ENCOUNTER — Ambulatory Visit (INDEPENDENT_AMBULATORY_CARE_PROVIDER_SITE_OTHER): Payer: Medicare Other | Admitting: Family

## 2011-12-10 DIAGNOSIS — Z8673 Personal history of transient ischemic attack (TIA), and cerebral infarction without residual deficits: Secondary | ICD-10-CM

## 2011-12-10 LAB — POCT INR: INR: 3.5

## 2011-12-10 NOTE — Patient Instructions (Signed)
Hold Coumadin tomorrow and take 1/2 on Friday. Continue same dose Take 2.5 mg on mondays and thursdays, 5 mg on other days, check in 3-4 weeks    Latest dosing instructions   Total Sun Mon Tue Wed Thu Fri Sat   30 5 mg 2.5 mg 5 mg 5 mg 2.5 mg 5 mg 5 mg    (5 mg1) (5 mg0.5) (5 mg1) (5 mg1) (5 mg0.5) (5 mg1) (5 mg1)

## 2011-12-16 ENCOUNTER — Ambulatory Visit (INDEPENDENT_AMBULATORY_CARE_PROVIDER_SITE_OTHER): Payer: Medicare Other | Admitting: Cardiovascular Disease

## 2011-12-16 ENCOUNTER — Encounter: Payer: Self-pay | Admitting: Cardiovascular Disease

## 2011-12-16 VITALS — BP 169/92 | HR 87 | Wt 115.0 lb

## 2011-12-16 DIAGNOSIS — I4891 Unspecified atrial fibrillation: Secondary | ICD-10-CM

## 2011-12-16 DIAGNOSIS — I059 Rheumatic mitral valve disease, unspecified: Secondary | ICD-10-CM

## 2011-12-16 NOTE — Patient Instructions (Signed)
Your physician wants you to follow-up in:  6 MONTHS WITH DR NISHAN  You will receive a reminder letter in the mail two months in advance. If you don't receive a letter, please call our office to schedule the follow-up appointment. Your physician recommends that you continue on your current medications as directed. Please refer to the Current Medication list given to you today. 

## 2011-12-16 NOTE — Assessment & Plan Note (Signed)
S/P repair with good result by recent echo  SBE prophylaxis

## 2011-12-16 NOTE — Progress Notes (Signed)
Patient ID: Theresa [name], female   DOB: 10-21-1931, 78 y.o.   MRN: [id number] She has a history of mitral regurgitation, status post mitral valve repair at [facility] in 2008, paroxysmal atrial fibrillation, prior stroke in 12/12 at [facility] (felt to be embolic), on chronic Coumadin therapy, colitis, Raynaud's. She was seen in the emergency room 11/13/11 with dizziness. Head CT demonstrated atrophy and chronic microvascular ischemic changes, no acute findings. MRI did not show anything acute. EKG demonstrated atrial fibrillation with a heart rate of 122. She did see her PCP in followup who adjusted her Toprol for rate control. She denies any further dizziness since going to the emergency room. She describes the dizziness as a feeling of imbalance. She denies a spinning sensation. She denies syncope or near-syncope. She denies significant dyspnea. She denies chest pain. She denies orthopnea, PND or edema.  Rate control better with higher Toprol and anticoagulation has been good with no bleeding  Echo 11/26/11 Study Conclusions  - Left ventricle: The cavity size was normal. Wall thickness was normal. The estimated ejection fraction was 50%. Diffuse hypokinesis. - Mitral valve: S/P repair with mild residual MR Mild regurgitation. - Left atrium: The atrium was moderately to severely dilated. - Atrial septum: No defect or patent foramen ovale was identified.      ROS: Denies fever, malais, weight loss, blurry vision, decreased visual acuity, cough, sputum, SOB, hemoptysis, pleuritic pain, palpitaitons, heartburn, abdominal pain, melena, lower extremity edema, claudication, or rash.  All other systems reviewed and negative  General: Affect appropriate Healthy:  appears stated age HEENT: normal Neck supple with no adenopathy JVP normal no bruits no thyromegaly Lungs clear with no wheezing and good diaphragmatic motion Heart:  S1/S2 no murmur, no rub, gallop or click PMI  normal Abdomen: benighn, BS positve, no tenderness, no AAA no bruit.  No HSM or HJR Distal pulses intact with no bruits No edema Neuro non-focal Skin warm and dry No muscular weakness   Current Outpatient Prescriptions  Medication Sig Dispense Refill  . Ascorbic Acid (VITAMIN C) 500 MG tablet Take 500 mg by mouth daily.        Marland Kitchen atorvastatin (LIPITOR) 20 MG tablet Take 1 tablet (20 mg total) by mouth daily.  90 tablet  3  . Calcium Carbonate-Vitamin D (CALTRATE 600+D) 600-400 MG-UNIT per tablet Take 1 tablet by mouth 2 (two) times daily.        . Cholecalciferol (VITAMIN D) 2000 UNITS CAPS Take 1 capsule by mouth daily.        . clindamycin (CLEOCIN T) 1 % lotion Apply 1 application topically at bedtime. Apply to face      . cyclobenzaprine (FLEXERIL) 5 MG tablet Take 1 tablet (5 mg total) by mouth at bedtime as needed for muscle spasms.  90 tablet  3  . etodolac (LODINE) 400 MG tablet Take 400 mg by mouth 2 (two) times daily.      . fish oil-omega-3 fatty acids 1000 MG capsule Take 1 g by mouth 2 (two) times daily.       . fluticasone (FLONASE) 50 MCG/ACT nasal spray Place 2 sprays into the nose daily.  16 g  6  . Folic Acid-Vit B6-Vit B12 (FOLBEE) 2.5-25-1 MG TABS Take 1 tablet by mouth daily.      Marland Kitchen glucosamine-chondroitin 500-400 MG tablet Take 1 tablet by mouth 2 (two) times daily.        Marland Kitchen levothyroxine (SYNTHROID, LEVOTHROID) 88 MCG tablet Take 1 tablet (  88 mcg total) by mouth daily.  90 tablet  3  . metoprolol succinate (TOPROL-XL) 100 MG 24 hr tablet TAKE 1 AND 1/2 (HALF) TABLETS = 150 MG TWICE DAILY  180 tablet  3  . Multiple Vitamin (MULTIVITAMIN) tablet Take 1 tablet by mouth daily.        Marland Kitchen omeprazole (PRILOSEC) 20 MG capsule Take 1 capsule (20 mg total) by mouth daily.  90 capsule  3  . traMADol (ULTRAM) 50 MG tablet Take 1 tablet (50 mg total) by mouth every 8 (eight) hours as needed for pain.  60 tablet  3  . vitamin A 8000 UNIT capsule Take 8,000 Units by mouth daily.       Marland Kitchen warfarin (COUMADIN) 5 MG tablet Take 2.5-5 mg by mouth daily. Takes 5mg  daily except 2.5mg  on Monday and thursday        Allergies  Rofecoxib  Electrocardiogram  11/24/11  afib rate 103 no ischemic changes  Assessment and Plan

## 2011-12-16 NOTE — Assessment & Plan Note (Signed)
Good rate control and anticoagulaiton  

## 2012-01-06 ENCOUNTER — Ambulatory Visit (INDEPENDENT_AMBULATORY_CARE_PROVIDER_SITE_OTHER): Payer: Medicare Other | Admitting: Family

## 2012-01-06 DIAGNOSIS — Z8673 Personal history of transient ischemic attack (TIA), and cerebral infarction without residual deficits: Secondary | ICD-10-CM

## 2012-01-06 NOTE — Patient Instructions (Addendum)
Hold Coumadin tomorrow. Reduce dose to 2.5 mg on Monday, Wednesday, and Friday, 5 mg on other days, Recheck Jan 28, 2012    Latest dosing instructions   Total Glynis Smiles Tue Wed Thu Fri Sat   27.5 5 mg 2.5 mg 5 mg 2.5 mg 5 mg 2.5 mg 5 mg    (5 mg1) (5 mg0.5) (5 mg1) (5 mg0.5) (5 mg1) (5 mg0.5) (5 mg1)

## 2012-01-07 ENCOUNTER — Encounter: Payer: Self-pay | Admitting: Internal Medicine

## 2012-01-07 ENCOUNTER — Ambulatory Visit (INDEPENDENT_AMBULATORY_CARE_PROVIDER_SITE_OTHER): Payer: Medicare Other | Admitting: Internal Medicine

## 2012-01-07 VITALS — BP 160/80 | HR 83 | Ht 62.0 in | Wt 110.0 lb

## 2012-01-07 DIAGNOSIS — I4891 Unspecified atrial fibrillation: Secondary | ICD-10-CM

## 2012-01-07 NOTE — Patient Instructions (Signed)
I will call you in the morning with the time and details of the cardioversion.

## 2012-01-07 NOTE — Assessment & Plan Note (Signed)
The patient has persistent atrial fibrillation manifesting as best as we know August 2013 with tachycardia and congestive failure.  While her left atrial dimension is quite large and suggests that maintaining a sinus rhythm is unlikely, the fact that she was in sinus rhythm for a couple of years prior to that event makes it worth while I think to  Attempt to restore sinus rhythm in with initially without antiarrhythmic drug support. The only antiarrhythmic drugs which is likely to work with the left atrial size would be amiodarone but if her symptoms truly attributable to atrial fibrillation I think it is worth the undertaking and the potential risks of the drug. She does also have a relatively rapid rate with mild activity and so if we cannot restore sinus rhythm and maintain it augmented rate control would be important.  She is currently on Coumadin with a supratherapeutic INR. Will anticipate undertaking cardioversion prior to her trip to [place] this weekend.  Her beta blocker dose, she thought had been increased, looking back to the notes from June/13 they have been stable.

## 2012-01-07 NOTE — Progress Notes (Signed)
ELECTROPHYSIOLOGY CONSULT NOTE  Patient ID: Theresa [name], MRN: [id number], DOB/AGE: May 04, 1931 78 y.o. Admit date: (Not on file) Date of Consult: 01/07/2012  Primary Physician: Carrie Mew, MD Primary Cardiologist: PN` Chief Complaint: afib   HPI Theresa [name] is a 78 y.o. female  seen at the request of Dr. Lovell Sheehan because of atrial fibrillation which is persistent.   She has a history of prior mitral valve repair at [facility] 2008 for mitral valve prolapse with echocardiograms 9/13 demonstrating intact repair normal left ventricular function and moderate-severe left atrial enlargement  She has a history of a prior CVA and anticoagulation with warfarin  She presented to the hospital in August with newly identified atrial fibrillation and a rapid ventricular response. Echo demonstrated left atrial enlargement at 5.5 and so was elected not to pursue cardioversion. Her metoprolol was increased. She has had progressive fatigue and shortness of breath. She has not had peripheral edema. He has had no tachycardia palpitations.  Past Medical History  Diagnosis Date  . Osteoarthritis   . Osteoporosis   . History of postmenopausal HRT   . MVP (mitral valve prolapse)     a. s/p MV repair at [facility] in 2008;  b. Echocardiogram 7/12: EF 50%, status post mitral valve repair with mild MR and minimal MS, mean gradient 4, moderate LAE, LA diam 55 mm; mild RAE, PASP 28-32;   Marland Kitchen Hypothyroidism   . Hx of adenomatous colonic polyps   . Collagenous colitis   . Atrial fibrillation     AFIB  . CVA (cerebral vascular accident)     a.  L parietal CVA by MRI 12/12, likely embolic;  b.  TEE by report with neg bubble study;  c.  carotid dopplers  12/12:  + plaque, no sig ICA stenosis       Surgical History:  Past Surgical History  Procedure Date  . Cholecystectomy   . Lumbar fusion   . Tonsillectomy   . Dilation and curettage of uterus   . Cataract extraction   . Mitral valve repair 2008      Home Meds: Prior to Admission medications   Medication Sig Start Date End Date Taking? Authorizing Provider  Ascorbic Acid (VITAMIN C) 500 MG tablet Take 500 mg by mouth daily.     Yes Historical Provider, MD  atorvastatin (LIPITOR) 20 MG tablet Take 1 tablet (20 mg total) by mouth daily. 04/24/11  Yes Stacie Glaze, MD  Calcium Carbonate-Vitamin D (CALTRATE 600+D) 600-400 MG-UNIT per tablet Take 1 tablet by mouth 2 (two) times daily.     Yes Historical Provider, MD  Cholecalciferol (VITAMIN D) 2000 UNITS CAPS Take 1 capsule by mouth daily.     Yes Historical Provider, MD  clindamycin (CLEOCIN T) 1 % lotion Apply 1 application topically at bedtime. Apply to face   Yes Historical Provider, MD  cyclobenzaprine (FLEXERIL) 5 MG tablet Take 1 tablet (5 mg total) by mouth at bedtime as needed for muscle spasms. 04/07/11  Yes Stacie Glaze, MD  etodolac (LODINE) 400 MG tablet Take 400 mg by mouth 2 (two) times daily. 04/07/11  Yes Stacie Glaze, MD  fish oil-omega-3 fatty acids 1000 MG capsule Take 1 g by mouth 2 (two) times daily.    Yes Historical Provider, MD  fluticasone (FLONASE) 50 MCG/ACT nasal spray Place 2 sprays into the nose daily. 03/03/11 03/02/12 Yes Stacie Glaze, MD  Folic Acid-Vit B6-Vit B12 (FOLBEE) 2.5-25-1 MG TABS Take 1 tablet by mouth  daily.   Yes Historical Provider, MD  glucosamine-chondroitin 500-400 MG tablet Take 1 tablet by mouth 2 (two) times daily.     Yes Historical Provider, MD  levothyroxine (SYNTHROID, LEVOTHROID) 88 MCG tablet Take 1 tablet (88 mcg total) by mouth daily. 12/09/11  Yes Stacie Glaze, MD  metoprolol succinate (TOPROL-XL) 100 MG 24 hr tablet TAKE 1 AND 1/2 (HALF) TABLETS = 150 MG TWICE DAILY 11/24/11  Yes Beatrice Lecher, PA  Multiple Vitamin (MULTIVITAMIN) tablet Take 1 tablet by mouth daily.     Yes Historical Provider, MD  omeprazole (PRILOSEC) 20 MG capsule Take 1 capsule (20 mg total) by mouth daily. 04/07/11  Yes Stacie Glaze, MD  traMADol (ULTRAM)  50 MG tablet Take 1 tablet (50 mg total) by mouth every 8 (eight) hours as needed for pain. 12/02/11  Yes Stacie Glaze, MD  vitamin A 8000 UNIT capsule Take 8,000 Units by mouth daily.   Yes Historical Provider, MD  warfarin (COUMADIN) 5 MG tablet Take 2.5-5 mg by mouth daily. Takes 5mg  daily except 2.5mg  on Monday and thursday 04/24/11  Yes Stacie Glaze, MD      Allergies:  Allergies  Allergen Reactions  . Rofecoxib     REACTION: Elevated LFT's    History   Social History  . Marital Status: Married    Spouse Name: N/A    Number of Children: N/A  . Years of Education: N/A   Occupational History  . RETIRED    Social History Main Topics  . Smoking status: Former Smoker    Quit date: 04/28/1966  . Smokeless tobacco: Never Used  . Alcohol Use: 3.5 oz/week    7 drink(s) per week  . Drug Use: No  . Sexually Active: Not Currently   Other Topics Concern  . Not on file   Social History Narrative  . No narrative on file     Family History  Problem Relation Age of Onset  . Cancer Father     COLON  . Stroke Neg Hx     1ST DEGREE RELATIVE <60     ROS:  Please see the history of present illness.   Anxiety and depression and some memory issues related to the stroke  All other systems reviewed and negative.    Physical Exam: Blood pressure 160/80, pulse 83, height 5\' 2"  (1.575 m), weight 110 lb (49.896 kg). General: Well developed, well nourished female in no acute distress. Head: Normocephalic, atraumatic, sclera non-icteric, no xanthomas, nares are without discharge. Lymph Nodes:  none Back: without /kyphosis, no CVA tendersness Neck: Negative for carotid bruits. JVD not elevated. Lungs: Clear bilaterally to auscultation without wheezes, rales, or rhonchi. Breathing is unlabored. Heart: Irregularly irregular with a loud S2 and an early systolic murmur , rubs, or gallops appreciated. Abdomen: Soft, non-tender, non-distended with normoactive bowel sounds. No  hepatomegaly. No rebound/guarding. No obvious abdominal masses. Msk:  Strength and tone appear normal for age. Extremities: No clubbing or cyanosis. No edema.  Distal pedal pulses are 2+ and equal bilaterally. Skin: Warm and Dry Neuro: Alert and oriented X 3. CN III-XII intact Grossly normal sensory and motor function . Psych:  Responds to questions appropriately with a normal affect.      Labs: Cardiac Enzymes No results found for this basename: CKTOTAL:4,CKMB:4,TROPONINI:4 in the last 72 hours CBC Lab Results  Component Value Date   WBC 6.8 11/13/2011   HGB 13.9 11/13/2011   HCT 40.7 11/13/2011   MCV 96.0  11/13/2011   PLT 231 11/13/2011   PROTIME:  Basename 01/06/12  LABPROT --  INR 4.0   Chemistry No results found for this basename: NA,K,CL,CO2,BUN,CREATININE,CALCIUM,LABALBU,PROT,BILITOT,ALKPHOS,ALT,AST,GLUCOSE in the last 168 hours Lipids Lab Results  Component Value Date   CHOL 263* 01/23/2011   HDL 104.50 01/23/2011   TRIG 69.0 01/23/2011   BNP No results found for this basename: probnp   Miscellaneous No results found for this basename: DDIMER    Radiology/Studies:  No results found.  EKG: Atrial fibrillation at 83 Intervals-/0.09/0.36 Nonspecific ST changes with left axis deviation   Assessment and Plan: Sherryl Manges

## 2012-01-08 ENCOUNTER — Encounter: Payer: Self-pay | Admitting: *Deleted

## 2012-01-08 LAB — BASIC METABOLIC PANEL
Calcium: 10.1 mg/dL (ref 8.4–10.5)
Creatinine, Ser: 1 mg/dL (ref 0.4–1.2)
GFR: 56.91 mL/min — ABNORMAL LOW (ref >60.00)
Sodium: 148 mEq/L — ABNORMAL HIGH (ref 135–145)

## 2012-01-08 LAB — CBC WITH DIFFERENTIAL/PLATELET
Basophils Absolute: 0.1 10*3/uL (ref 0.0–0.1)
Eosinophils Relative: 2.5 % (ref 0.0–5.0)
Hemoglobin: 13.1 g/dL (ref 12.0–15.0)
Lymphocytes Relative: 24 % (ref 12.0–46.0)
Lymphs Abs: 1.6 10*3/uL (ref 0.7–4.0)
Monocytes Absolute: 0.5 10*3/uL (ref 0.1–1.0)
Monocytes Relative: 7.9 % (ref 3.0–12.0)
Neutro Abs: 4.2 10*3/uL (ref 1.4–7.7)
Neutrophils Relative %: 64.4 % (ref 43.0–77.0)
RBC: 4.09 Mil/uL (ref 3.87–5.11)
WBC: 6.5 10*3/uL (ref 4.5–10.5)

## 2012-01-08 LAB — PROTIME-INR
INR: 3.4 ratio — ABNORMAL HIGH (ref 0.8–1.0)
Prothrombin Time: 35 s — ABNORMAL HIGH (ref 10.2–12.4)

## 2012-01-09 ENCOUNTER — Encounter (HOSPITAL_COMMUNITY)
Admission: RE | Disposition: A | Discharge status: Home / Self Care | Payer: Self-pay | Source: Ambulatory Visit | Attending: Internal Medicine

## 2012-01-09 ENCOUNTER — Encounter (HOSPITAL_COMMUNITY): Payer: Self-pay | Admitting: *Deleted

## 2012-01-09 ENCOUNTER — Encounter (HOSPITAL_COMMUNITY): Payer: Self-pay

## 2012-01-09 ENCOUNTER — Ambulatory Visit (HOSPITAL_COMMUNITY)
Admission: RE | Admit: 2012-01-09 | Discharge: 2012-01-09 | Disposition: A | Discharge status: Home / Self Care | Payer: Medicare Other | Source: Ambulatory Visit | Attending: Internal Medicine | Admitting: Internal Medicine

## 2012-01-09 ENCOUNTER — Ambulatory Visit (HOSPITAL_COMMUNITY): Payer: Medicare Other | Admitting: *Deleted

## 2012-01-09 DIAGNOSIS — Z7901 Long term (current) use of anticoagulants: Secondary | ICD-10-CM | POA: Insufficient documentation

## 2012-01-09 DIAGNOSIS — I4891 Unspecified atrial fibrillation: Secondary | ICD-10-CM

## 2012-01-09 HISTORY — PX: CARDIOVERSION: SHX1299

## 2012-01-09 SURGERY — CARDIOVERSION
Anesthesia: General

## 2012-01-09 MED ORDER — SODIUM CHLORIDE 0.9 % IV SOLN
INTRAVENOUS | Status: DC | PRN
  Administered 2012-01-09: 13:00:00 via INTRAVENOUS

## 2012-01-09 MED ORDER — PROPOFOL 10 MG/ML IV BOLUS
INTRAVENOUS | Status: DC | PRN
  Administered 2012-01-09: 60 mg via INTRAVENOUS

## 2012-01-09 MED ORDER — LIDOCAINE HCL (CARDIAC) 20 MG/ML IV SOLN
INTRAVENOUS | Status: DC | PRN
  Administered 2012-01-09: 60 mg via INTRAVENOUS

## 2012-01-09 NOTE — Anesthesia Postprocedure Evaluation (Signed)
  Anesthesia Post-op Note  Patient: Theresa [name]  Procedure(s) Performed: Procedure(s) (LRB) with comments: CARDIOVERSION (N/A)  Patient Location: PACU, Cath Lab and Endoscopy Unit  Anesthesia Type: General  Level of Consciousness: awake and alert   Airway and Oxygen Therapy: Patient Spontanous Breathing  Post-op Pain: none  Post-op Assessment: Post-op Vital signs reviewed, Patient's Cardiovascular Status Stable, Respiratory Function Stable, Patent Airway, No signs of Nausea or vomiting and Pain level controlled  Post-op Vital Signs: stable  Complications: No apparent anesthesia complications

## 2012-01-09 NOTE — H&P (View-Only) (Signed)
 ELECTROPHYSIOLOGY CONSULT NOTE  Patient ID: Theresa [name], MRN: [id number], DOB/AGE: 76/09/1931 76 y.o. Admit date: (Not on file) Date of Consult: 01/07/2012  Primary Physician: JENKINS,JOHN EDWARD, MD Primary Cardiologist: PN` Chief Complaint: afib   HPI Theresa [name] is a 78 y.o. female  seen at the request of Dr. Jenkins because of atrial fibrillation which is persistent.   She has a history of prior mitral valve repair at [facility] 2008 for mitral valve prolapse with echocardiograms 9/13 demonstrating intact repair normal left ventricular function and moderate-severe left atrial enlargement  She has a history of a prior CVA and anticoagulation with warfarin  She presented to the hospital in August with newly identified atrial fibrillation and a rapid ventricular response. Echo demonstrated left atrial enlargement at 5.5 and so was elected not to pursue cardioversion. Her metoprolol was increased. She has had progressive fatigue and shortness of breath. She has not had peripheral edema. He has had no tachycardia palpitations.  Past Medical History  Diagnosis Date  . Osteoarthritis   . Osteoporosis   . History of postmenopausal HRT   . MVP (mitral valve prolapse)     a. s/p MV repair at [facility] in 2008;  b. Echocardiogram 7/12: EF 50%, status post mitral valve repair with mild MR and minimal MS, mean gradient 4, moderate LAE, LA diam 55 mm; mild RAE, PASP 28-32;   . Hypothyroidism   . Hx of adenomatous colonic polyps   . Collagenous colitis   . Atrial fibrillation     AFIB  . CVA (cerebral vascular accident)     a.  L parietal CVA by MRI 12/12, likely embolic;  b.  TEE by report with neg bubble study;  c.  carotid dopplers  12/12:  + plaque, no sig ICA stenosis       Surgical History:  Past Surgical History  Procedure Date  . Cholecystectomy   . Lumbar fusion   . Tonsillectomy   . Dilation and curettage of uterus   . Cataract extraction   . Mitral valve repair 2008      Home Meds: Prior to Admission medications   Medication Sig Start Date End Date Taking? Authorizing Provider  Ascorbic Acid (VITAMIN C) 500 MG tablet Take 500 mg by mouth daily.     Yes Historical Provider, MD  atorvastatin (LIPITOR) 20 MG tablet Take 1 tablet (20 mg total) by mouth daily. 04/24/11  Yes John E Jenkins, MD  Calcium Carbonate-Vitamin D (CALTRATE 600+D) 600-400 MG-UNIT per tablet Take 1 tablet by mouth 2 (two) times daily.     Yes Historical Provider, MD  Cholecalciferol (VITAMIN D) 2000 UNITS CAPS Take 1 capsule by mouth daily.     Yes Historical Provider, MD  clindamycin (CLEOCIN T) 1 % lotion Apply 1 application topically at bedtime. Apply to face   Yes Historical Provider, MD  cyclobenzaprine (FLEXERIL) 5 MG tablet Take 1 tablet (5 mg total) by mouth at bedtime as needed for muscle spasms. 04/07/11  Yes John E Jenkins, MD  etodolac (LODINE) 400 MG tablet Take 400 mg by mouth 2 (two) times daily. 04/07/11  Yes John E Jenkins, MD  fish oil-omega-3 fatty acids 1000 MG capsule Take 1 g by mouth 2 (two) times daily.    Yes Historical Provider, MD  fluticasone (FLONASE) 50 MCG/ACT nasal spray Place 2 sprays into the nose daily. 03/03/11 03/02/12 Yes John E Jenkins, MD  Folic Acid-Vit B6-Vit B12 (FOLBEE) 2.5-25-1 MG TABS Take 1 tablet by mouth   daily.   Yes Historical Provider, MD  glucosamine-chondroitin 500-400 MG tablet Take 1 tablet by mouth 2 (two) times daily.     Yes Historical Provider, MD  levothyroxine (SYNTHROID, LEVOTHROID) 88 MCG tablet Take 1 tablet (88 mcg total) by mouth daily. 12/09/11  Yes John E Jenkins, MD  metoprolol succinate (TOPROL-XL) 100 MG 24 hr tablet TAKE 1 AND 1/2 (HALF) TABLETS = 150 MG TWICE DAILY 11/24/11  Yes Scott T Weaver, PA  Multiple Vitamin (MULTIVITAMIN) tablet Take 1 tablet by mouth daily.     Yes Historical Provider, MD  omeprazole (PRILOSEC) 20 MG capsule Take 1 capsule (20 mg total) by mouth daily. 04/07/11  Yes John E Jenkins, MD  traMADol (ULTRAM)  50 MG tablet Take 1 tablet (50 mg total) by mouth every 8 (eight) hours as needed for pain. 12/02/11  Yes John E Jenkins, MD  vitamin A 8000 UNIT capsule Take 8,000 Units by mouth daily.   Yes Historical Provider, MD  warfarin (COUMADIN) 5 MG tablet Take 2.5-5 mg by mouth daily. Takes 5mg daily except 2.5mg on Monday and thursday 04/24/11  Yes John E Jenkins, MD      Allergies:  Allergies  Allergen Reactions  . Rofecoxib     REACTION: Elevated LFT's    History   Social History  . Marital Status: Married    Spouse Name: N/A    Number of Children: N/A  . Years of Education: N/A   Occupational History  . RETIRED    Social History Main Topics  . Smoking status: Former Smoker    Quit date: 04/28/1966  . Smokeless tobacco: Never Used  . Alcohol Use: 3.5 oz/week    7 drink(s) per week  . Drug Use: No  . Sexually Active: Not Currently   Other Topics Concern  . Not on file   Social History Narrative  . No narrative on file     Family History  Problem Relation Age of Onset  . Cancer Father     COLON  . Stroke Neg Hx     1ST DEGREE RELATIVE <60     ROS:  Please see the history of present illness.   Anxiety and depression and some memory issues related to the stroke  All other systems reviewed and negative.    Physical Exam: Blood pressure 160/80, pulse 83, height 5' 2" (1.575 m), weight 110 lb (49.896 kg). General: Well developed, well nourished female in no acute distress. Head: Normocephalic, atraumatic, sclera non-icteric, no xanthomas, nares are without discharge. Lymph Nodes:  none Back: without /kyphosis, no CVA tendersness Neck: Negative for carotid bruits. JVD not elevated. Lungs: Clear bilaterally to auscultation without wheezes, rales, or rhonchi. Breathing is unlabored. Heart: Irregularly irregular with a loud S2 and an early systolic murmur , rubs, or gallops appreciated. Abdomen: Soft, non-tender, non-distended with normoactive bowel sounds. No  hepatomegaly. No rebound/guarding. No obvious abdominal masses. Msk:  Strength and tone appear normal for age. Extremities: No clubbing or cyanosis. No edema.  Distal pedal pulses are 2+ and equal bilaterally. Skin: Warm and Dry Neuro: Alert and oriented X 3. CN III-XII intact Grossly normal sensory and motor function . Psych:  Responds to questions appropriately with a normal affect.      Labs: Cardiac Enzymes No results found for this basename: CKTOTAL:4,CKMB:4,TROPONINI:4 in the last 72 hours CBC Lab Results  Component Value Date   WBC 6.8 11/13/2011   HGB 13.9 11/13/2011   HCT 40.7 11/13/2011   MCV 96.0   11/13/2011   PLT 231 11/13/2011   PROTIME:  Basename 01/06/12  LABPROT --  INR 4.0   Chemistry No results found for this basename: NA,K,CL,CO2,BUN,CREATININE,CALCIUM,LABALBU,PROT,BILITOT,ALKPHOS,ALT,AST,GLUCOSE in the last 168 hours Lipids Lab Results  Component Value Date   CHOL 263* 01/23/2011   HDL 104.50 01/23/2011   TRIG 69.0 01/23/2011   BNP No results found for this basename: probnp   Miscellaneous No results found for this basename: DDIMER    Radiology/Studies:  No results found.  EKG: Atrial fibrillation at 83 Intervals-/0.09/0.36 Nonspecific ST changes with left axis deviation   Assessment and Plan: Taneeka Curtner   

## 2012-01-09 NOTE — Preoperative (Signed)
Beta Blockers   Reason not to administer Beta Blockers:Not Applicable 

## 2012-01-09 NOTE — Brief Op Note (Signed)
01/09/2012  2:30 PM  PATIENT:  Theresa [name]  78 y.o. female  PRE-OPERATIVE DIAGNOSIS:  a-fib   PROCEDURE:  Procedure(s) (LRB) with comments: CARDIOVERSION (N/A)  SURGEON:  Surgeon(s) and Role:    [name]  PHYSICIAN ASSISTANT:   Patient anesthetized by anesthesia with 60 mg Propofol IV  With the pads in the AP position the patient was cardioverted with 150 J synchronized biphasic energy to SR.

## 2012-01-09 NOTE — Addendum Note (Signed)
Addendum  created 01/09/12 1453 by Bedelia Person, MD   Modules edited:Anesthesia Events, Notes Section

## 2012-01-09 NOTE — Interval H&P Note (Signed)
History and Physical Interval Note:  01/09/2012 2:26 PM  Theresa [name]  has presented today for surgery, with the diagnosis of a-fib  The various methods of treatment have been discussed with the patient and family. After consideration of risks, benefits and other options for treatment, the patient has consented to  Procedure(s) (LRB) with comments: CARDIOVERSION (N/A) as a surgical intervention .  The patient's history has been reviewed, patient examined, no change in status, stable for surgery.  I have reviewed the patient's chart and labs.  Questions were answered to the patient's satisfaction.     [name]

## 2012-01-09 NOTE — Anesthesia Postprocedure Evaluation (Signed)
  Anesthesia Post-op Note  Patient: Theresa [name]  Procedure(s) Performed: Procedure(s) (LRB) with comments: CARDIOVERSION (N/A)  Patient Location: PACU and Endoscopy Unit  Anesthesia Type: MAC  Level of Consciousness: awake, alert  and oriented  Airway and Oxygen Therapy: Patient Spontanous Breathing and Patient connected to nasal cannula oxygen  Post-op Pain: none  Post-op Assessment: Post-op Vital signs reviewed, Patient's Cardiovascular Status Stable, Respiratory Function Stable, Patent Airway and No signs of Nausea or vomiting  Post-op Vital Signs: Reviewed and stable  Complications: No apparent anesthesia complications

## 2012-01-09 NOTE — Anesthesia Postprocedure Evaluation (Signed)
  Anesthesia Post-op Note  Patient: Theresa [name]  Procedure(s) Performed: Procedure(s) (LRB) with comments: CARDIOVERSION (N/A)  Patient Location: PACU and Endoscopy Unit  Anesthesia Type: General  Level of Consciousness: awake  Airway and Oxygen Therapy: Patient Spontanous Breathing  Post-op Pain: none  Post-op Assessment: Post-op Vital signs reviewed, Patient's Cardiovascular Status Stable, Respiratory Function Stable, Patent Airway, No signs of Nausea or vomiting, Adequate PO intake and Pain level controlled  Post-op Vital Signs: stable  Complications: No apparent anesthesia complications

## 2012-01-09 NOTE — Progress Notes (Signed)
Pt here for DCCV, INRs reviewed and therapeutic, orders written.  INR    Date/Time Value Range Status  01/07/2012  5:06 PM 3.4* 0.8 - 1.0 ratio Final  01/06/2012 4.0   Final  12/10/2011 3.5   Final  11/13/2011 11:42 AM 3.46* 0.00 - 1.49 Final  10/29/2011 2.9   Final  09/17/2011 3.0   Final  08/20/2011 2.0   Final  07/22/2011  2:30 PM 2.8   Final  04/08/2010  1:49 PM 2.2   Final  03/04/2010  2:21 PM 2.2   Final  02/04/2010  2:04 PM 3.1   Final  12/03/2009  1:52 PM 2.8   Final

## 2012-01-09 NOTE — Anesthesia Preprocedure Evaluation (Signed)
Anesthesia Evaluation  Patient identified by MRN, date of birth, ID band Patient awake    Reviewed: Allergy & Precautions, H&P , NPO status , Patient's Chart, lab work & pertinent test results  Airway  TM Distance: >3 FB Neck ROM: Full    Dental   Pulmonary  breath sounds clear to auscultation        Cardiovascular + dysrhythmias Atrial Fibrillation + Valvular Problems/Murmurs MVP Rhythm:Irregular Rate:Normal     Neuro/Psych CVA    GI/Hepatic GERD-  ,  Endo/Other  Hypothyroidism   Renal/GU      Musculoskeletal   Abdominal   Peds  Hematology   Anesthesia Other Findings   Reproductive/Obstetrics                           Anesthesia Physical Anesthesia Plan  ASA: III  Anesthesia Plan: General   Post-op Pain Management:    Induction: Intravenous  Airway Management Planned: Mask  Additional Equipment:   Intra-op Plan:   Post-operative Plan:   Informed Consent: I have reviewed the patients History and Physical, chart, labs and discussed the procedure including the risks, benefits and alternatives for the proposed anesthesia with the patient or authorized representative who has indicated his/her understanding and acceptance.     Plan Discussed with: CRNA and Surgeon  Anesthesia Plan Comments:         Anesthesia Quick Evaluation

## 2012-01-09 NOTE — Progress Notes (Signed)
Cardioversion 150 joules at 1440-converted to sinus rhythm.

## 2012-01-09 NOTE — Addendum Note (Signed)
Addendum  created 01/09/12 1454 by Bedelia Person, MD   Modules edited:Anesthesia Events, Notes Section

## 2012-01-09 NOTE — Transfer of Care (Signed)
Immediate Anesthesia Transfer of Care Note  Patient: Theresa [name]  Procedure(s) Performed: Procedure(s) (LRB) with comments: CARDIOVERSION (N/A)  Patient Location: PACU and Endoscopy Unit  Anesthesia Type: MAC  Level of Consciousness: awake, alert  and oriented  Airway & Oxygen Therapy: Patient Spontanous Breathing and Patient connected to nasal cannula oxygen  Post-op Assessment: Report given to PACU RN, Post -op Vital signs reviewed and stable and Patient moving all extremities  Post vital signs: Reviewed and stable  Complications: No apparent anesthesia complications

## 2012-01-12 ENCOUNTER — Encounter (HOSPITAL_COMMUNITY): Payer: Self-pay | Admitting: Internal Medicine

## 2012-01-28 ENCOUNTER — Encounter: Payer: Medicare Other | Admitting: Family

## 2012-01-28 ENCOUNTER — Ambulatory Visit: Payer: Medicare Other | Admitting: Internal Medicine

## 2012-01-28 ENCOUNTER — Ambulatory Visit (INDEPENDENT_AMBULATORY_CARE_PROVIDER_SITE_OTHER): Payer: Medicare Other | Admitting: Family

## 2012-01-28 ENCOUNTER — Telehealth: Payer: Self-pay | Admitting: Internal Medicine

## 2012-01-28 DIAGNOSIS — Z8673 Personal history of transient ischemic attack (TIA), and cerebral infarction without residual deficits: Secondary | ICD-10-CM

## 2012-01-28 DIAGNOSIS — I4891 Unspecified atrial fibrillation: Secondary | ICD-10-CM

## 2012-01-28 LAB — POCT INR: INR: 2.6

## 2012-01-28 MED ORDER — ONDANSETRON HCL 8 MG PO TABS: 8.0000 mg | ORAL_TABLET | Freq: Three times a day (TID) | ORAL | Status: DC | PRN

## 2012-01-28 NOTE — Telephone Encounter (Signed)
Caller: Ara/Patient; Patient Name: Theresa [name]; PCP: Darryll Capers (Adults only); Best Callback Phone Number: [phone].  Patient calling about weight loss over past 2 weeks while on a cruise in [place].  States has had nausea for several days, and had one episode of vomiting AM 01/28/12.  Afebrile.  Denies diarrhea, pain, or other emergent symptoms per vomiting protocol; advised appt within 72 hours.  Patient has coumadin appt scheduled 01/28/12 1500 with Ms. Orvan Falconer; no appts available with Ms. Orvan Falconer at that time.  Info to office for staff review/appt management.   May reach patient at [phone].

## 2012-01-28 NOTE — Telephone Encounter (Signed)
padonda talked with pt while she was here for  inr--thinks maybe a virus- keep hydrated and call back if not better

## 2012-01-28 NOTE — Patient Instructions (Signed)
Continue same dose . Take 2.5 mg on Monday, Wed, and Friday.  5 mg on other days, Recheck in 4 weeks.     Latest dosing instructions   Total Sun Mon Tue Wed Thu Fri Sat   27.5 5 mg 2.5 mg 5 mg 2.5 mg 5 mg 2.5 mg 5 mg    (5 mg1) (5 mg0.5) (5 mg1) (5 mg0.5) (5 mg1) (5 mg0.5) (5 mg1)        

## 2012-01-28 NOTE — Telephone Encounter (Signed)
Message given to padonda and she will talk with pt when she comes in a t 3pm for pro-time

## 2012-01-30 ENCOUNTER — Encounter: Payer: Medicare Other | Admitting: Family

## 2012-02-04 ENCOUNTER — Ambulatory Visit (INDEPENDENT_AMBULATORY_CARE_PROVIDER_SITE_OTHER): Payer: Medicare Other | Admitting: Internal Medicine

## 2012-02-04 ENCOUNTER — Encounter: Payer: Self-pay | Admitting: Internal Medicine

## 2012-02-04 VITALS — BP 150/80 | HR 72 | Temp 98.0°F | Resp 16 | Ht 61.75 in | Wt 107.0 lb

## 2012-02-04 DIAGNOSIS — I059 Rheumatic mitral valve disease, unspecified: Secondary | ICD-10-CM

## 2012-02-04 DIAGNOSIS — I10 Essential (primary) hypertension: Secondary | ICD-10-CM

## 2012-02-04 DIAGNOSIS — K219 Gastro-esophageal reflux disease without esophagitis: Secondary | ICD-10-CM

## 2012-02-04 DIAGNOSIS — E039 Hypothyroidism, unspecified: Secondary | ICD-10-CM

## 2012-02-04 DIAGNOSIS — I6990 Unspecified sequelae of unspecified cerebrovascular disease: Secondary | ICD-10-CM

## 2012-02-04 LAB — CBC WITH DIFFERENTIAL/PLATELET
Basophils Absolute: 0.1 10*3/uL (ref 0.0–0.1)
Eosinophils Absolute: 0.1 10*3/uL (ref 0.0–0.7)
Hemoglobin: 13.4 g/dL (ref 12.0–15.0)
Lymphocytes Relative: 28.8 % (ref 12.0–46.0)
MCHC: 33.3 g/dL (ref 30.0–36.0)
MCV: 96.9 fl (ref 78.0–100.0)
Monocytes Absolute: 0.7 10*3/uL (ref 0.1–1.0)
Neutro Abs: 4.1 10*3/uL (ref 1.4–7.7)
RDW: 14 % (ref 11.5–14.6)

## 2012-02-04 LAB — T3, FREE: T3, Free: 2.5 pg/mL (ref 2.3–4.2)

## 2012-02-04 MED ORDER — NEBIVOLOL HCL 10 MG PO TABS: 10.0000 mg | ORAL_TABLET | Freq: Every day | ORAL | Status: DC

## 2012-02-04 NOTE — Patient Instructions (Signed)
The patient is instructed to continue all medications as prescribed. Schedule followup with check out clerk upon leaving the clinic  

## 2012-02-04 NOTE — Progress Notes (Signed)
Subjective:    Patient ID: Theresa [name], female    DOB: 09/26/1931, 77 y.o.   MRN: [id number]  HPI  Patient with scoliosis and moderately severe OA in hands. Has dropped out of choir Had cardioversion and has been in SR Has increased mult-ijoint pain Mild dull ache in right side of head Blood pressure has been elevated  Review of Systems  Constitutional: Positive for fatigue. Negative for activity change and appetite change.  HENT: Positive for congestion and neck stiffness. Negative for ear pain, neck pain, postnasal drip and sinus pressure.   Eyes: Negative for redness and visual disturbance.  Respiratory: Positive for shortness of breath. Negative for cough and wheezing.   Cardiovascular: Positive for palpitations and leg swelling.  Gastrointestinal: Negative for abdominal pain and abdominal distention.  Genitourinary: Negative for dysuria, frequency and menstrual problem.  Musculoskeletal: Positive for back pain and joint swelling. Negative for myalgias and arthralgias.  Skin: Negative for rash and wound.  Neurological: Positive for tremors and weakness. Negative for dizziness and headaches.  Hematological: Negative for adenopathy. Does not bruise/bleed easily.  Psychiatric/Behavioral: Negative for sleep disturbance and decreased concentration.   Past Medical History  Diagnosis Date  . Osteoarthritis   . Osteoporosis   . History of postmenopausal HRT   . MVP (mitral valve prolapse)     a. s/p MV repair at [facility] in 2008;  b. Echocardiogram 7/12: EF 50%, status post mitral valve repair with mild MR and minimal MS, mean gradient 4, moderate LAE, LA diam 55 mm; mild RAE, PASP 28-32;   Marland Kitchen Hypothyroidism   . Hx of adenomatous colonic polyps   . Collagenous colitis   . Atrial fibrillation     AFIB  . CVA (cerebral vascular accident)     a.  L parietal CVA by MRI 12/12, likely embolic;  b.  TEE by report with neg bubble study;  c.  carotid dopplers  12/12:  + plaque, no sig ICA  stenosis     History   Social History  . Marital Status: Married    Spouse Name: N/A    Number of Children: N/A  . Years of Education: N/A   Occupational History  . RETIRED    Social History Main Topics  . Smoking status: Former Smoker    Quit date: 04/28/1966  . Smokeless tobacco: Never Used  . Alcohol Use: 3.5 oz/week    7 drink(s) per week  . Drug Use: No  . Sexually Active: Not Currently   Other Topics Concern  . Not on file   Social History Narrative  . No narrative on file    Past Surgical History  Procedure Date  . Cholecystectomy   . Lumbar fusion   . Tonsillectomy   . Dilation and curettage of uterus   . Cataract extraction   . Mitral valve repair 2008  . Cardioversion 01/09/2012    Procedure: CARDIOVERSION;  Surgeon: Dolores Patty, MD;  Location: [facility] ENDOSCOPY;  Service: Cardiovascular;  Laterality: N/A;    Family History  Problem Relation Age of Onset  . Cancer Father     COLON  . Stroke Neg Hx     1ST DEGREE RELATIVE <60    Allergies  Allergen Reactions  . Rofecoxib     REACTION: Elevated LFT's    Current Outpatient Prescriptions on File Prior to Visit  Medication Sig Dispense Refill  . Ascorbic Acid (VITAMIN C) 500 MG tablet Take 500 mg by mouth daily.        Marland Kitchen  atorvastatin (LIPITOR) 20 MG tablet Take 1 tablet (20 mg total) by mouth daily.  90 tablet  3  . Calcium Carbonate-Vitamin D (CALTRATE 600+D) 600-400 MG-UNIT per tablet Take 1 tablet by mouth 2 (two) times daily.        . Cholecalciferol (VITAMIN D) 2000 UNITS CAPS Take 1 capsule by mouth daily.        . clindamycin (CLEOCIN T) 1 % lotion Apply 1 application topically at bedtime. Apply to face      . cyclobenzaprine (FLEXERIL) 5 MG tablet Take 1 tablet (5 mg total) by mouth at bedtime as needed for muscle spasms.  90 tablet  3  . etodolac (LODINE) 400 MG tablet Take 400 mg by mouth 2 (two) times daily.      . fish oil-omega-3 fatty acids 1000 MG capsule Take 1 g by mouth 2 (two)  times daily.       . fluticasone (FLONASE) 50 MCG/ACT nasal spray Place 2 sprays into the nose daily.  16 g  6  . Folic Acid-Vit B6-Vit B12 (FOLBEE) 2.5-25-1 MG TABS Take 1 tablet by mouth daily.      Marland Kitchen glucosamine-chondroitin 500-400 MG tablet Take 1 tablet by mouth 2 (two) times daily.        Marland Kitchen levothyroxine (SYNTHROID, LEVOTHROID) 88 MCG tablet Take 1 tablet (88 mcg total) by mouth daily.  90 tablet  3  . metoprolol succinate (TOPROL-XL) 100 MG 24 hr tablet TAKE 1 AND 1/2 (HALF) TABLETS = 150 MG TWICE DAILY  180 tablet  3  . Multiple Vitamin (MULTIVITAMIN) tablet Take 1 tablet by mouth daily.        Marland Kitchen omeprazole (PRILOSEC) 20 MG capsule Take 1 capsule (20 mg total) by mouth daily.  90 capsule  3  . ondansetron (ZOFRAN) 8 MG tablet Take 1 tablet (8 mg total) by mouth every 8 (eight) hours as needed for nausea.  20 tablet  0  . traMADol (ULTRAM) 50 MG tablet Take 1 tablet (50 mg total) by mouth every 8 (eight) hours as needed for pain.  60 tablet  3  . vitamin A 8000 UNIT capsule Take 8,000 Units by mouth daily.      Marland Kitchen warfarin (COUMADIN) 5 MG tablet Take 2.5-5 mg by mouth daily. Takes 5mg  daily except 2.5mg  on Monday and thursday        BP 150/80  Pulse 72  Temp 98 F (36.7 C)  Resp 16  Ht 5' 1.75" (1.568 m)  Wt 107 lb (48.535 kg)  BMI 19.73 kg/m2        Objective:   Physical Exam  Nursing note and vitals reviewed. Constitutional: She is oriented to person, place, and time. She appears well-developed and well-nourished. No distress.  HENT:  Head: Normocephalic and atraumatic.  Eyes: Conjunctivae normal are normal. Pupils are equal, round, and reactive to light.  Neck: Normal range of motion. Neck supple. No JVD present. No tracheal deviation present. No thyromegaly present.  Cardiovascular: Normal rate, regular rhythm and intact distal pulses.   Murmur heard. Pulmonary/Chest: Effort normal and breath sounds normal. She has no wheezes. She exhibits no tenderness.  Abdominal:  Soft. Bowel sounds are normal.  Musculoskeletal: Normal range of motion. She exhibits tenderness. She exhibits no edema.  Lymphadenopathy:    She has no cervical adenopathy.  Neurological: She is alert and oriented to person, place, and time. She displays abnormal reflex. No cranial nerve deficit. Coordination abnormal.  Skin: Skin is warm and dry. She  is not diaphoretic.  Psychiatric: She has a normal mood and affect. Her behavior is normal.          Assessment & Plan:  Weight loss.... appetite change noted Increased blood pressure and head aches without energy. Early awakening at 4 AM Polyarticular arthritis Hx of hypothyroidism noted adjustment of T4 last OV

## 2012-02-13 ENCOUNTER — Ambulatory Visit: Payer: Medicare Other | Admitting: Internal Medicine

## 2012-02-20 ENCOUNTER — Telehealth: Payer: Self-pay | Admitting: Internal Medicine

## 2012-02-20 NOTE — Telephone Encounter (Signed)
Caller: Beva/Patient; Phone: [phone]; Reason for Call: States blood pressure is better, but is concerned about the patient information packet which she states warns of using with thyroid disease or Reynaud's disease.  Per manufacturer's info, no symptoms of increased reynauds or overactive thyroid; last level done 02/04/12 when medication was prescribed.  To follow up at next office visit; callback parameters given.  Krs/can

## 2012-02-20 NOTE — Telephone Encounter (Signed)
Told pt to please discuss with padonda at coumadin appointment 12/10-pt agrees

## 2012-02-24 ENCOUNTER — Ambulatory Visit (INDEPENDENT_AMBULATORY_CARE_PROVIDER_SITE_OTHER): Payer: Medicare Other | Admitting: Family

## 2012-02-24 DIAGNOSIS — Z8673 Personal history of transient ischemic attack (TIA), and cerebral infarction without residual deficits: Secondary | ICD-10-CM

## 2012-02-24 NOTE — Patient Instructions (Addendum)
Continue same dose . Take 2.5 mg on Monday, Wed, and Friday.  5 mg on other days, Recheck in 4 weeks.     Latest dosing instructions   Total Sun Mon Tue Wed Thu Fri Sat   27.5 5 mg 2.5 mg 5 mg 2.5 mg 5 mg 2.5 mg 5 mg    (5 mg1) (5 mg0.5) (5 mg1) (5 mg0.5) (5 mg1) (5 mg0.5) (5 mg1)

## 2012-03-03 ENCOUNTER — Telehealth: Payer: Self-pay | Admitting: Internal Medicine

## 2012-03-03 NOTE — Telephone Encounter (Signed)
Pt still having same concerns as before about her med. She feels edgy, agitated, and is concerned because she has both thyroid disease and Reynaud's disease. I reassured patient Theresa [name] would call her back. Phone: [phone]

## 2012-03-03 NOTE — Telephone Encounter (Signed)
Will see dr Kirtland Bouchard tomorrow

## 2012-03-04 ENCOUNTER — Encounter: Payer: Self-pay | Admitting: Internal Medicine

## 2012-03-04 ENCOUNTER — Ambulatory Visit (INDEPENDENT_AMBULATORY_CARE_PROVIDER_SITE_OTHER): Payer: Medicare Other | Admitting: Internal Medicine

## 2012-03-04 VITALS — BP 170/100 | HR 95 | Temp 98.0°F | Resp 18 | Wt 107.0 lb

## 2012-03-04 DIAGNOSIS — I10 Essential (primary) hypertension: Secondary | ICD-10-CM | POA: Insufficient documentation

## 2012-03-04 DIAGNOSIS — I4891 Unspecified atrial fibrillation: Secondary | ICD-10-CM

## 2012-03-04 DIAGNOSIS — I7300 Raynaud's syndrome without gangrene: Secondary | ICD-10-CM

## 2012-03-04 MED ORDER — NEBIVOLOL HCL 5 MG PO TABS: 5.0000 mg | ORAL_TABLET | Freq: Every day | ORAL | Status: DC

## 2012-03-04 MED ORDER — DILTIAZEM HCL ER 180 MG PO CP24: 180.0000 mg | ORAL_CAPSULE | Freq: Every day | ORAL | Status: DC

## 2012-03-04 NOTE — Progress Notes (Signed)
Subjective:    Patient ID: Theresa [name], female    DOB: 11-21-1931, 78 y.o.   MRN: [id number]  HPI  78 year old patient who has a history of atrial fibrillation. She underwent cardioversion approximately 2 months ago. She is on chronic Coumadin anticoagulation following an embolic stroke. One month ago she was placed on bysystolic 10 mg daily. For the past week she has had increasing blood pressure readings. She says her heart rate has averaged from 75-85. She feels this medication has caused side effects including being more jittery and agitated. She also is concerned about the beta blocker therapy worsening Clydie Braun eyes phenomenon  Past Medical History  Diagnosis Date  . Osteoarthritis   . Osteoporosis   . History of postmenopausal HRT   . MVP (mitral valve prolapse)     a. s/p MV repair at [facility] in 2008;  b. Echocardiogram 7/12: EF 50%, status post mitral valve repair with mild MR and minimal MS, mean gradient 4, moderate LAE, LA diam 55 mm; mild RAE, PASP 28-32;   Marland Kitchen Hypothyroidism   . Hx of adenomatous colonic polyps   . Collagenous colitis   . Atrial fibrillation     AFIB  . CVA (cerebral vascular accident)     a.  L parietal CVA by MRI 12/12, likely embolic;  b.  TEE by report with neg bubble study;  c.  carotid dopplers  12/12:  + plaque, no sig ICA stenosis     History   Social History  . Marital Status: Married    Spouse Name: N/A    Number of Children: N/A  . Years of Education: N/A   Occupational History  . RETIRED    Social History Main Topics  . Smoking status: Former Smoker    Quit date: 04/28/1966  . Smokeless tobacco: Never Used  . Alcohol Use: 3.5 oz/week    7 drink(s) per week  . Drug Use: No  . Sexually Active: Not Currently   Other Topics Concern  . Not on file   Social History Narrative  . No narrative on file    Past Surgical History  Procedure Date  . Cholecystectomy   . Lumbar fusion   . Tonsillectomy   . Dilation and curettage of uterus    . Cataract extraction   . Mitral valve repair 2008  . Cardioversion 01/09/2012    Procedure: CARDIOVERSION;  Surgeon: Dolores Patty, MD;  Location: [facility] ENDOSCOPY;  Service: Cardiovascular;  Laterality: N/A;    Family History  Problem Relation Age of Onset  . Cancer Father     COLON  . Stroke Neg Hx     1ST DEGREE RELATIVE <60    Allergies  Allergen Reactions  . Rofecoxib     REACTION: Elevated LFT's    Current Outpatient Prescriptions on File Prior to Visit  Medication Sig Dispense Refill  . Ascorbic Acid (VITAMIN C) 500 MG tablet Take 500 mg by mouth daily.        Marland Kitchen atorvastatin (LIPITOR) 20 MG tablet Take 1 tablet (20 mg total) by mouth daily.  90 tablet  3  . Calcium Carbonate-Vitamin D (CALTRATE 600+D) 600-400 MG-UNIT per tablet Take 1 tablet by mouth 2 (two) times daily.        . Cholecalciferol (VITAMIN D) 2000 UNITS CAPS Take 1 capsule by mouth daily.        . clindamycin (CLEOCIN T) 1 % lotion Apply 1 application topically at bedtime. Apply to face      .  cyclobenzaprine (FLEXERIL) 5 MG tablet Take 1 tablet (5 mg total) by mouth at bedtime as needed for muscle spasms.  90 tablet  3  . etodolac (LODINE) 400 MG tablet Take 400 mg by mouth 2 (two) times daily.      . fish oil-omega-3 fatty acids 1000 MG capsule Take 1 g by mouth 2 (two) times daily.       . fluticasone (FLONASE) 50 MCG/ACT nasal spray Place 2 sprays into the nose daily.  16 g  6  . Folic Acid-Vit B6-Vit B12 (FOLBEE) 2.5-25-1 MG TABS Take 1 tablet by mouth daily.      Marland Kitchen glucosamine-chondroitin 500-400 MG tablet Take 1 tablet by mouth 2 (two) times daily.        Marland Kitchen levothyroxine (SYNTHROID, LEVOTHROID) 88 MCG tablet Take 1 tablet (88 mcg total) by mouth daily.  90 tablet  3  . Multiple Vitamin (MULTIVITAMIN) tablet Take 1 tablet by mouth daily.        . nebivolol (BYSTOLIC) 10 MG tablet Take 1 tablet (10 mg total) by mouth daily.  30 tablet  11  . omeprazole (PRILOSEC) 20 MG capsule Take 1 capsule (20 mg  total) by mouth daily.  90 capsule  3  . ondansetron (ZOFRAN) 8 MG tablet Take 1 tablet (8 mg total) by mouth every 8 (eight) hours as needed for nausea.  20 tablet  0  . traMADol (ULTRAM) 50 MG tablet Take 1 tablet (50 mg total) by mouth every 8 (eight) hours as needed for pain.  60 tablet  3  . vitamin A 8000 UNIT capsule Take 8,000 Units by mouth daily.      Marland Kitchen warfarin (COUMADIN) 5 MG tablet Take 2.5-5 mg by mouth daily. Takes 5mg  daily except 2.5mg  on Monday and thursday        BP 170/100  Pulse 95  Temp 98 F (36.7 C) (Oral)  Resp 18  Wt 107 lb (48.535 kg)  SpO2 96%       Review of Systems  Constitutional: Negative.   HENT: Negative for hearing loss, congestion, sore throat, rhinorrhea, dental problem, sinus pressure and tinnitus.   Eyes: Negative for pain, discharge and visual disturbance.  Respiratory: Negative for cough and shortness of breath.   Cardiovascular: Negative for chest pain, palpitations and leg swelling.  Gastrointestinal: Negative for nausea, vomiting, abdominal pain, diarrhea, constipation, blood in stool and abdominal distention.  Genitourinary: Negative for dysuria, urgency, frequency, hematuria, flank pain, vaginal bleeding, vaginal discharge, difficulty urinating, vaginal pain and pelvic pain.  Musculoskeletal: Negative for joint swelling, arthralgias and gait problem.  Skin: Negative for rash.  Neurological: Negative for dizziness, syncope, speech difficulty, weakness, numbness and headaches.  Hematological: Negative for adenopathy.  Psychiatric/Behavioral: Negative for behavioral problems, dysphoric mood and agitation. The patient is nervous/anxious.        Objective:   Physical Exam  Constitutional: She is oriented to person, place, and time. She appears well-developed and well-nourished.       Blood pressure 170-180/90-100  HENT:  Head: Normocephalic.  Right Ear: External ear normal.  Left Ear: External ear normal.  Mouth/Throat: Oropharynx  is clear and moist.  Eyes: Conjunctivae normal and EOM are normal. Pupils are equal, round, and reactive to light.  Neck: Normal range of motion. Neck supple. No thyromegaly present.  Cardiovascular: Normal rate, regular rhythm, normal heart sounds and intact distal pulses.        Frequent ectopics but probably normal sinus rhythm  Pulmonary/Chest: Effort normal and  breath sounds normal.  Abdominal: Soft. Bowel sounds are normal. She exhibits no mass. There is no tenderness.  Musculoskeletal: Normal range of motion.  Lymphadenopathy:    She has no cervical adenopathy.  Neurological: She is alert and oriented to person, place, and time.  Skin: Skin is warm and dry. No rash noted.  Psychiatric: She has a normal mood and affect. Her behavior is normal.          Assessment & Plan:   Hypertension. We'll taper and discontinue Bystolic and substitute diltiazem History of Raynaud's History of atrial fibrillation

## 2012-03-04 NOTE — Patient Instructions (Signed)
Decreased Bystolic  To 5 mg daily for 1 week and then 5 mg every other day for 2 weeks Diltiazem 180 one tablet daily  Please check your blood pressure on a regular basis.  If it is consistently greater than 150/90, please make an office appointment.  Return in one month for follow-up

## 2012-03-23 ENCOUNTER — Ambulatory Visit (INDEPENDENT_AMBULATORY_CARE_PROVIDER_SITE_OTHER): Payer: Medicare Other | Admitting: Family

## 2012-03-23 DIAGNOSIS — I4891 Unspecified atrial fibrillation: Secondary | ICD-10-CM

## 2012-03-23 DIAGNOSIS — Z8673 Personal history of transient ischemic attack (TIA), and cerebral infarction without residual deficits: Secondary | ICD-10-CM

## 2012-03-23 LAB — POCT INR: INR: 1.9

## 2012-03-23 NOTE — Patient Instructions (Addendum)
Take an extra 1/2 tab today only. Continue same dose . Take 2.5 mg on Monday, Wed, and Friday.  5 mg on other days, Recheck in 4 weeks.     Latest dosing instructions   Total Sun Mon Tue Wed Thu Fri Sat   27.5 5 mg 2.5 mg 5 mg 2.5 mg 5 mg 2.5 mg 5 mg    (5 mg1) (5 mg0.5) (5 mg1) (5 mg0.5) (5 mg1) (5 mg0.5) (5 mg1)

## 2012-04-01 ENCOUNTER — Encounter: Payer: Self-pay | Admitting: Internal Medicine

## 2012-04-01 ENCOUNTER — Ambulatory Visit (INDEPENDENT_AMBULATORY_CARE_PROVIDER_SITE_OTHER): Payer: Medicare Other | Admitting: Internal Medicine

## 2012-04-01 VITALS — BP 150/90 | HR 104 | Temp 97.6°F | Resp 16 | Wt 110.0 lb

## 2012-04-01 DIAGNOSIS — I10 Essential (primary) hypertension: Secondary | ICD-10-CM

## 2012-04-01 DIAGNOSIS — M1990 Unspecified osteoarthritis, unspecified site: Secondary | ICD-10-CM

## 2012-04-01 DIAGNOSIS — I7300 Raynaud's syndrome without gangrene: Secondary | ICD-10-CM

## 2012-04-01 DIAGNOSIS — I4891 Unspecified atrial fibrillation: Secondary | ICD-10-CM

## 2012-04-01 NOTE — Progress Notes (Signed)
Subjective:    Patient ID: Theresa [name], female    DOB: 14-Nov-1931, 79 y.o.   MRN: [id number]  HPI  79 year old patient who has a history of mitral valve disease paroxysmal atrial fibrillation status post cardioversion on chronic Coumadin anticoagulation.  In the past the patient had been on metoprolol at a final dose of 150 mg twice a day for blood pressure and pulse control.  She was switched to bystolic do to perceived side effects and has been concerned about an elevated pulse rate.  She felt that she did not tolerate Bystolic well and this was tapered and discontinued and diltiazem substituted about one month ago. She has been off beta blocker therapy for one week and has noted the pulse rates in the 85-90 range. At home she states her blood pressure has been reasonably well controlled.  She was also concerned about beta blocker therapy aggravating Raynaud's disease  Past Medical History  Diagnosis Date  . Osteoarthritis   . Osteoporosis   . History of postmenopausal HRT   . MVP (mitral valve prolapse)     a. s/p MV repair at [facility] in 2008;  b. Echocardiogram 7/12: EF 50%, status post mitral valve repair with mild MR and minimal MS, mean gradient 4, moderate LAE, LA diam 55 mm; mild RAE, PASP 28-32;   Marland Kitchen Hypothyroidism   . Hx of adenomatous colonic polyps   . Collagenous colitis   . Atrial fibrillation     AFIB  . CVA (cerebral vascular accident)     a.  L parietal CVA by MRI 12/12, likely embolic;  b.  TEE by report with neg bubble study;  c.  carotid dopplers  12/12:  + plaque, no sig ICA stenosis     History   Social History  . Marital Status: Married    Spouse Name: N/A    Number of Children: N/A  . Years of Education: N/A   Occupational History  . RETIRED    Social History Main Topics  . Smoking status: Former Smoker    Quit date: 04/28/1966  . Smokeless tobacco: Never Used  . Alcohol Use: 3.5 oz/week    7 drink(s) per week  . Drug Use: No  . Sexually Active: Not  Currently   Other Topics Concern  . Not on file   Social History Narrative  . No narrative on file    Past Surgical History  Procedure Date  . Cholecystectomy   . Lumbar fusion   . Tonsillectomy   . Dilation and curettage of uterus   . Cataract extraction   . Mitral valve repair 2008  . Cardioversion 01/09/2012    Procedure: CARDIOVERSION;  Surgeon: Dolores Patty, MD;  Location: [facility] ENDOSCOPY;  Service: Cardiovascular;  Laterality: N/A;    Family History  Problem Relation Age of Onset  . Cancer Father     COLON  . Stroke Neg Hx     1ST DEGREE RELATIVE <60    Allergies  Allergen Reactions  . Rofecoxib     REACTION: Elevated LFT's    Current Outpatient Prescriptions on File Prior to Visit  Medication Sig Dispense Refill  . Ascorbic Acid (VITAMIN C) 500 MG tablet Take 500 mg by mouth daily.        . Calcium Carbonate-Vitamin D (CALTRATE 600+D) 600-400 MG-UNIT per tablet Take 1 tablet by mouth 2 (two) times daily.        . Cholecalciferol (VITAMIN D) 2000 UNITS CAPS Take 1 capsule by  mouth daily.        . clindamycin (CLEOCIN T) 1 % lotion Apply 1 application topically at bedtime. Apply to face      . cyclobenzaprine (FLEXERIL) 5 MG tablet Take 1 tablet (5 mg total) by mouth at bedtime as needed for muscle spasms.  90 tablet  3  . diltiazem (DILACOR XR) 180 MG 24 hr capsule Take 1 capsule (180 mg total) by mouth daily.  90 capsule  1  . etodolac (LODINE) 400 MG tablet Take 400 mg by mouth 2 (two) times daily.      . fish oil-omega-3 fatty acids 1000 MG capsule Take 1 g by mouth 2 (two) times daily.       . Folic Acid-Vit B6-Vit B12 (FOLBEE) 2.5-25-1 MG TABS Take 1 tablet by mouth daily.      Marland Kitchen glucosamine-chondroitin 500-400 MG tablet Take 1 tablet by mouth 2 (two) times daily.        Marland Kitchen levothyroxine (SYNTHROID, LEVOTHROID) 88 MCG tablet Take 1 tablet (88 mcg total) by mouth daily.  90 tablet  3  . Multiple Vitamin (MULTIVITAMIN) tablet Take 1 tablet by mouth daily.         Marland Kitchen omeprazole (PRILOSEC) 20 MG capsule Take 1 capsule (20 mg total) by mouth daily.  90 capsule  3  . ondansetron (ZOFRAN) 8 MG tablet Take 1 tablet (8 mg total) by mouth every 8 (eight) hours as needed for nausea.  20 tablet  0  . traMADol (ULTRAM) 50 MG tablet Take 1 tablet (50 mg total) by mouth every 8 (eight) hours as needed for pain.  60 tablet  3  . vitamin A 8000 UNIT capsule Take 8,000 Units by mouth daily.      Marland Kitchen warfarin (COUMADIN) 5 MG tablet Take 2.5-5 mg by mouth daily. Takes 5mg  daily except 2.5mg  on Monday and thursday      . atorvastatin (LIPITOR) 20 MG tablet Take 1 tablet (20 mg total) by mouth daily.  90 tablet  3  . fluticasone (FLONASE) 50 MCG/ACT nasal spray Place 2 sprays into the nose daily.  16 g  6    BP 150/90  Pulse 104  Temp 97.6 F (36.4 C) (Oral)  Resp 16  Wt 110 lb (49.896 kg)  SpO2 98%       Review of Systems     Objective:   Physical Exam  Constitutional: She appears well-developed and well-nourished. No distress.       Blood pressure 150/90 Pulse rate 98 O2 saturation 98  Cardiovascular: Normal rate and regular rhythm.           Assessment & Plan:   Hypertension paroxysmal atrial fibrillation.  Patient is quite concerned about her elevated pulse rate. She is aware that this is related to tapering and discontinuation of beta blocker therapy. Options were discussed. It was elected to resume metoprolol 100 mg twice a day with possible titration to a prior dose of 150 twice a day. Diltiazem will be discontinued after a few days. Will recheck in 4 weeks

## 2012-04-01 NOTE — Patient Instructions (Signed)
Start metoprolol 100 mg twice daily  Discontinue diltiazem after 3 days  Return in one month for followup  Limit your sodium (Salt) intake

## 2012-04-20 ENCOUNTER — Ambulatory Visit (INDEPENDENT_AMBULATORY_CARE_PROVIDER_SITE_OTHER): Payer: Medicare Other | Admitting: Family

## 2012-04-20 DIAGNOSIS — I4891 Unspecified atrial fibrillation: Secondary | ICD-10-CM

## 2012-04-20 DIAGNOSIS — Z8673 Personal history of transient ischemic attack (TIA), and cerebral infarction without residual deficits: Secondary | ICD-10-CM

## 2012-04-20 NOTE — Patient Instructions (Addendum)
2.5 mg on Monday, Wed, and Friday.  5 mg on other days, Recheck in 4 weeks.     Latest dosing instructions   Total Sun Mon Tue Wed Thu Fri Sat   27.5 5 mg 2.5 mg 5 mg 2.5 mg 5 mg 2.5 mg 5 mg    (5 mg1) (5 mg0.5) (5 mg1) (5 mg0.5) (5 mg1) (5 mg0.5) (5 mg1)

## 2012-05-07 ENCOUNTER — Encounter: Payer: Self-pay | Admitting: Internal Medicine

## 2012-05-07 ENCOUNTER — Ambulatory Visit (INDEPENDENT_AMBULATORY_CARE_PROVIDER_SITE_OTHER): Payer: Medicare Other | Admitting: Internal Medicine

## 2012-05-07 VITALS — BP 160/84 | HR 96 | Temp 98.2°F | Resp 16 | Ht 61.75 in | Wt 110.0 lb

## 2012-05-07 DIAGNOSIS — I10 Essential (primary) hypertension: Secondary | ICD-10-CM

## 2012-05-07 DIAGNOSIS — R000 Tachycardia, unspecified: Secondary | ICD-10-CM

## 2012-05-07 DIAGNOSIS — I059 Rheumatic mitral valve disease, unspecified: Secondary | ICD-10-CM

## 2012-05-07 DIAGNOSIS — I6990 Unspecified sequelae of unspecified cerebrovascular disease: Secondary | ICD-10-CM

## 2012-05-07 DIAGNOSIS — E039 Hypothyroidism, unspecified: Secondary | ICD-10-CM

## 2012-05-07 DIAGNOSIS — I4891 Unspecified atrial fibrillation: Secondary | ICD-10-CM

## 2012-05-07 MED ORDER — LEVOTHYROXINE SODIUM 75 MCG PO TABS: 75.0000 ug | ORAL_TABLET | Freq: Every day | ORAL | Status: DC

## 2012-05-07 NOTE — Patient Instructions (Signed)
Stay on Lopressor and decreased her thyroid dose to 75 mcg

## 2012-05-07 NOTE — Addendum Note (Signed)
Addended by: Alfred Levins D on: 05/07/2012 11:39 AM   Modules accepted: Orders

## 2012-05-07 NOTE — Progress Notes (Signed)
  Subjective:    Patient ID: Theresa [name], female    DOB: [birth year], 79 y.o.   MRN: [id number]  HPI Had been on diltiazem per Dr Kirtland Bouchard for increased HR and HTN now is on BB with toprol The blood pressure had responded with variable pulse On coumadin for AF and CVA risks Bring a chart of random BP's  All in range   70-80 Has not seen AF on BP machine  ( hx of conversion)  Rate 110  With Sinus arrythmia    Review of Systems  Constitutional: Negative for activity change, appetite change and fatigue.  HENT: Negative for ear pain, congestion, neck pain, postnasal drip and sinus pressure.   Eyes: Negative for redness and visual disturbance.  Respiratory: Negative for cough, shortness of breath and wheezing.   Gastrointestinal: Negative for abdominal pain and abdominal distention.  Genitourinary: Negative for dysuria, frequency and menstrual problem.  Musculoskeletal: Negative for myalgias, joint swelling and arthralgias.  Skin: Negative for rash and wound.  Neurological: Negative for dizziness, weakness and headaches.  Hematological: Negative for adenopathy. Does not bruise/bleed easily.  Psychiatric/Behavioral: Negative for sleep disturbance and decreased concentration.       Objective:   Physical Exam  Nursing note and vitals reviewed. Constitutional: She is oriented to person, place, and time. She appears well-developed and well-nourished. No distress.  HENT:  Head: Normocephalic and atraumatic.  Right Ear: External ear normal.  Left Ear: External ear normal.  Nose: Nose normal.  Mouth/Throat: Oropharynx is clear and moist.  Eyes: Conjunctivae and EOM are normal. Pupils are equal, round, and reactive to light.  Neck: Normal range of motion. Neck supple. No JVD present. No tracheal deviation present. No thyromegaly present.  Cardiovascular: Normal rate.   No murmur heard. tachcardia  Pulmonary/Chest: Effort normal and breath sounds normal. She has no wheezes. She exhibits no  tenderness.  Abdominal: Soft. Bowel sounds are normal.  Musculoskeletal: She exhibits edema and tenderness.  dengerative hand changes  Lymphadenopathy:    She has no cervical adenopathy.  Neurological: She is alert and oriented to person, place, and time. She has normal reflexes. No cranial nerve deficit.  Skin: She is not diaphoretic.  Psychiatric: She has a normal mood and affect. Her behavior is normal.          Assessment & Plan:  Tachyrhythmia  And PB issues with hx of AF/ AF With increased rate concern for AFluttler Increased anxiety  And mild flushing  Mostly at night after dinner. Monitor thyroid and decrease to 75 Keep on BB EKG to make sure not in flutter  I suspect coumadin and synthroid interaction Consider newer oral agent

## 2012-05-11 ENCOUNTER — Telehealth: Payer: Self-pay | Admitting: Internal Medicine

## 2012-05-11 DIAGNOSIS — E785 Hyperlipidemia, unspecified: Secondary | ICD-10-CM

## 2012-05-11 MED ORDER — ATORVASTATIN CALCIUM 20 MG PO TABS: 20.0000 mg | ORAL_TABLET | Freq: Every day | ORAL | Status: DC

## 2012-05-11 NOTE — Telephone Encounter (Signed)
Pt needs 10 day supply of atorvastatin 20 mg call into [facility]. Pt is waiting on mail order

## 2012-05-11 NOTE — Telephone Encounter (Signed)
done

## 2012-05-17 ENCOUNTER — Encounter: Payer: Self-pay | Admitting: Cardiovascular Disease

## 2012-05-17 ENCOUNTER — Ambulatory Visit (INDEPENDENT_AMBULATORY_CARE_PROVIDER_SITE_OTHER): Payer: Medicare Other | Admitting: Cardiovascular Disease

## 2012-05-17 ENCOUNTER — Ambulatory Visit (INDEPENDENT_AMBULATORY_CARE_PROVIDER_SITE_OTHER): Payer: Medicare Other

## 2012-05-17 VITALS — BP 150/80 | HR 92 | Wt 109.0 lb

## 2012-05-17 DIAGNOSIS — E039 Hypothyroidism, unspecified: Secondary | ICD-10-CM

## 2012-05-17 DIAGNOSIS — I059 Rheumatic mitral valve disease, unspecified: Secondary | ICD-10-CM

## 2012-05-17 DIAGNOSIS — Z8673 Personal history of transient ischemic attack (TIA), and cerebral infarction without residual deficits: Secondary | ICD-10-CM

## 2012-05-17 DIAGNOSIS — I4891 Unspecified atrial fibrillation: Secondary | ICD-10-CM

## 2012-05-17 DIAGNOSIS — I10 Essential (primary) hypertension: Secondary | ICD-10-CM

## 2012-05-17 DIAGNOSIS — M4120 Other idiopathic scoliosis, site unspecified: Secondary | ICD-10-CM

## 2012-05-17 LAB — POCT INR: INR: 2.1

## 2012-05-17 NOTE — Assessment & Plan Note (Signed)
Thought to be embolic due to PAF  Life long coumadin or novel agent

## 2012-05-17 NOTE — Progress Notes (Signed)
Patient ID: Theresa [name], female   DOB: Jan 02, 1932, 79 y.o.   MRN: [id number] She has a history of mitral regurgitation, status post mitral valve repair at [facility] in 2008, paroxysmal atrial fibrillation, prior stroke in 12/12 at [facility] (felt to be embolic), on chronic Coumadin therapy, colitis, Raynaud's. She was seen in the emergency room 11/13/11 with dizziness. Head CT demonstrated atrophy and chronic microvascular ischemic changes, no acute findings. MRI did not show anything acute. EKG demonstrated atrial fibrillation with a heart rate of 122. She did see her PCP in followup who adjusted her Toprol for rate control.  Subsequently cardioverted by Dr Tenny Craw 10/13   Echo 11/26/11  Study Conclusions  - Left ventricle: The cavity size was normal. Wall thickness was normal. The estimated ejection fraction was 50%. Diffuse hypokinesis. - Mitral valve: S/P repair with mild residual MR Mild regurgitation. - Left atrium: The atrium was moderately to severely dilated. - Atrial septum: No defect or patent foramen ovale was identified.  She has had multiple changes to her BP meds by Dr Kirtland Bouchard and Lovell Sheehan.  Did not tolerate bystolic then was on cardizem and then toprol.  Synthroid lowered To f/u with Dr Lovell Sheehan in March  ROS: Denies fever, malais, weight loss, blurry vision, decreased visual acuity, cough, sputum, SOB, hemoptysis, pleuritic pain, palpitaitons, heartburn, abdominal pain, melena, lower extremity edema, claudication, or rash.  All other systems reviewed and negative  General: Affect appropriate Thin white female with scoliosis HEENT: normal Neck supple with no adenopathy JVP normal no bruits no thyromegaly Lungs clear with no wheezing and good diaphragmatic motion Heart:  S1/S2 no murmur, no rub, gallop or click PMI normal Abdomen: benighn, BS positve, no tenderness, no AAA no bruit.  No HSM or HJR Distal pulses intact with no bruits No edema Neuro  non-focal Skin warm and dry No muscular weakness Arthritic changes in hands   Current Outpatient Prescriptions  Medication Sig Dispense Refill  . Ascorbic Acid (VITAMIN C) 500 MG tablet Take 500 mg by mouth daily.        Marland Kitchen atorvastatin (LIPITOR) 20 MG tablet Take 1 tablet (20 mg total) by mouth daily.  10 tablet  3  . Calcium Carbonate-Vitamin D (CALTRATE 600+D) 600-400 MG-UNIT per tablet Take 1 tablet by mouth 2 (two) times daily.        . Cholecalciferol (VITAMIN D) 2000 UNITS CAPS Take 1 capsule by mouth daily.        . clindamycin (CLEOCIN T) 1 % lotion Apply 1 application topically at bedtime. Apply to face      . cyclobenzaprine (FLEXERIL) 5 MG tablet Take 1 tablet (5 mg total) by mouth at bedtime as needed for muscle spasms.  90 tablet  3  . etodolac (LODINE) 400 MG tablet Take 400 mg by mouth 2 (two) times daily.      . fish oil-omega-3 fatty acids 1000 MG capsule Take 1 g by mouth 2 (two) times daily.       . Folic Acid-Vit B6-Vit B12 (FOLBEE) 2.5-25-1 MG TABS Take 1 tablet by mouth daily.      Marland Kitchen glucosamine-chondroitin 500-400 MG tablet Take 1 tablet by mouth 2 (two) times daily.        Marland Kitchen levothyroxine (SYNTHROID, LEVOTHROID) 75 MCG tablet Take 1 tablet (75 mcg total) by mouth daily.  90 tablet  3  . metoprolol (LOPRESSOR) 100 MG tablet Take 100 mg by mouth 2 (two) times daily.      Marland Kitchen  Multiple Vitamin (MULTIVITAMIN) tablet Take 1 tablet by mouth daily.        . Multiple Vitamins-Minerals (ICAPS AREDS FORMULA PO) Take 2 capsules by mouth daily.      Marland Kitchen omeprazole (PRILOSEC) 20 MG capsule Take 1 capsule (20 mg total) by mouth daily.  90 capsule  3  . ondansetron (ZOFRAN) 8 MG tablet Take 1 tablet (8 mg total) by mouth every 8 (eight) hours as needed for nausea.  20 tablet  0  . traMADol (ULTRAM) 50 MG tablet Take 1 tablet (50 mg total) by mouth every 8 (eight) hours as needed for pain.  60 tablet  3  . vitamin A 8000 UNIT capsule Take 8,000 Units by mouth daily.      Marland Kitchen warfarin  (COUMADIN) 5 MG tablet Take 2.5-5 mg by mouth daily. Takes 5mg  daily except 2.5mg  on Monday and thursday       No current facility-administered medications for this visit.    Allergies  Rofecoxib  Electrocardiogram: 05/07/12  SR rate 72  RSR' otherwise normal  Assessment and Plan

## 2012-05-17 NOTE — Assessment & Plan Note (Signed)
S/P repair No murmur mild MR on echo 9/13  SBE prophylaxis

## 2012-05-17 NOTE — Assessment & Plan Note (Signed)
Synthroid decreased by Lovell Sheehan  F/U TSH

## 2012-05-17 NOTE — Assessment & Plan Note (Signed)
F/U Theresa [name]  On high dose metoprolol.  Consider adding ACE

## 2012-05-17 NOTE — Assessment & Plan Note (Signed)
Maint NSR no palpitations.  Discussed changing to xarelto She will look into cost  Will check INR here today to save her a trip to [employer]

## 2012-05-17 NOTE — Assessment & Plan Note (Signed)
Mild chronic pain.  Discussed limiting NSAI's with coumadin  Can use Tramadol

## 2012-05-17 NOTE — Patient Instructions (Signed)
Your physician wants you to follow-up in:   6 MONTHS WITH DR Haywood Filler will receive a reminder letter in the mail two months in advance. If you don't receive a letter, please call our office to schedule the follow-up appointment. Your physician recommends that you continue on your current medications as directed. Please refer to the Current Medication list given to you today.  CHECK ON Theresa [name]

## 2012-05-18 ENCOUNTER — Encounter: Payer: Medicare Other | Admitting: Family

## 2012-06-15 ENCOUNTER — Ambulatory Visit (INDEPENDENT_AMBULATORY_CARE_PROVIDER_SITE_OTHER): Payer: Medicare Other | Admitting: Family

## 2012-06-15 DIAGNOSIS — I4891 Unspecified atrial fibrillation: Secondary | ICD-10-CM

## 2012-06-15 DIAGNOSIS — Z8673 Personal history of transient ischemic attack (TIA), and cerebral infarction without residual deficits: Secondary | ICD-10-CM

## 2012-06-15 NOTE — Patient Instructions (Addendum)
Take an extra 1/2 tab today. Continue on same dosage 5mg  daily except 2.5mg  on Mondays, Wednesdays and Fridays.  Recheck in 3 weeks.   Anticoagulation Dose Instructions as of 06/15/2012     Glynis Smiles Tue Wed Thu Fri Sat   New Dose 5 mg 2.5 mg 5 mg 2.5 mg 5 mg 2.5 mg 5 mg    Description       Take an extra 1/2 tab today. Continue on same dosage 5mg  daily except 2.5mg  on Mondays, Wednesdays and Fridays.  Recheck in 3 weeks.

## 2012-06-23 ENCOUNTER — Telehealth: Payer: Self-pay | Admitting: Internal Medicine

## 2012-06-23 NOTE — Telephone Encounter (Signed)
Theresa [name], since Olegario Messier is out, can you call pt to sched please?

## 2012-06-23 NOTE — Telephone Encounter (Signed)
She already has thyroid test orders in lab, just needs to make appointment for  lab

## 2012-06-23 NOTE — Telephone Encounter (Signed)
Pt is sch for 4-16

## 2012-06-23 NOTE — Telephone Encounter (Signed)
Caller: Tran/Patient; Phone: [phone]; Reason for Call: Called to ask if MD would like to order thryroid tests before appointment 07/07/12.  Please call back.

## 2012-06-30 ENCOUNTER — Other Ambulatory Visit (INDEPENDENT_AMBULATORY_CARE_PROVIDER_SITE_OTHER): Payer: Medicare Other

## 2012-06-30 DIAGNOSIS — E039 Hypothyroidism, unspecified: Secondary | ICD-10-CM

## 2012-07-07 ENCOUNTER — Encounter: Payer: Medicare Other | Admitting: Family

## 2012-07-07 ENCOUNTER — Ambulatory Visit (INDEPENDENT_AMBULATORY_CARE_PROVIDER_SITE_OTHER): Payer: Medicare Other | Admitting: Family

## 2012-07-07 ENCOUNTER — Ambulatory Visit: Payer: Medicare Other | Admitting: Internal Medicine

## 2012-07-07 DIAGNOSIS — Z8673 Personal history of transient ischemic attack (TIA), and cerebral infarction without residual deficits: Secondary | ICD-10-CM

## 2012-07-07 DIAGNOSIS — I4891 Unspecified atrial fibrillation: Secondary | ICD-10-CM

## 2012-07-07 NOTE — Patient Instructions (Addendum)
Take an extra 1/2 tab today. Continue on same dosage 5mg  daily except 2.5mg  on Mondays, and Fridays.  Recheck in 3 weeks.   Anticoagulation Dose Instructions as of 07/07/2012     Theresa [name] Tue Wed Thu Fri Sat   New Dose 5 mg 2.5 mg 5 mg 5 mg 5 mg 2.5 mg 5 mg    Description       Take an extra 1/2 tab today. Continue on same dosage 5mg  daily except 2.5mg  on Mondays, and Fridays.  Recheck in 3 weeks.

## 2012-07-09 ENCOUNTER — Ambulatory Visit (INDEPENDENT_AMBULATORY_CARE_PROVIDER_SITE_OTHER): Payer: Medicare Other | Admitting: Internal Medicine

## 2012-07-09 ENCOUNTER — Encounter: Payer: Self-pay | Admitting: Internal Medicine

## 2012-07-09 VITALS — BP 136/84 | HR 80 | Temp 98.1°F | Resp 16 | Ht 61.75 in | Wt 110.0 lb

## 2012-07-09 DIAGNOSIS — I10 Essential (primary) hypertension: Secondary | ICD-10-CM

## 2012-07-09 DIAGNOSIS — M19049 Primary osteoarthritis, unspecified hand: Secondary | ICD-10-CM

## 2012-07-09 DIAGNOSIS — I4891 Unspecified atrial fibrillation: Secondary | ICD-10-CM

## 2012-07-09 MED ORDER — ETODOLAC 400 MG PO TABS: 400.0000 mg | ORAL_TABLET | Freq: Two times a day (BID) | ORAL | Status: DC

## 2012-07-09 MED ORDER — WARFARIN SODIUM 5 MG PO TABS: 2.5000 mg | ORAL_TABLET | Freq: Every day | ORAL | Status: DC

## 2012-07-09 MED ORDER — METOPROLOL TARTRATE 100 MG PO TABS: 100.0000 mg | ORAL_TABLET | Freq: Two times a day (BID) | ORAL | Status: DC

## 2012-07-09 NOTE — Patient Instructions (Signed)
The patient is instructed to continue all medications as prescribed. Schedule followup with check out clerk upon leaving the clinic  

## 2012-07-09 NOTE — Progress Notes (Signed)
Subjective:    Patient ID: Theresa [name], female    DOB: [birth year], 79 y.o.   MRN: [id number]  HPI revieiwed INR Discussion of synthroid medications and recent labs Blood pressure stable AF stable S/P CVA with mild dysarthria stable     Review of Systems  Constitutional: Negative for activity change, appetite change and fatigue.  HENT: Negative for ear pain, congestion, neck pain, postnasal drip and sinus pressure.   Eyes: Negative for redness and visual disturbance.  Respiratory: Negative for cough, shortness of breath and wheezing.   Gastrointestinal: Negative for abdominal pain and abdominal distention.  Genitourinary: Negative for dysuria, frequency and menstrual problem.  Musculoskeletal: Negative for myalgias, joint swelling and arthralgias.  Skin: Negative for rash and wound.  Neurological: Positive for speech difficulty. Negative for dizziness, weakness and headaches.  Hematological: Negative for adenopathy. Does not bruise/bleed easily.  Psychiatric/Behavioral: Negative for sleep disturbance and decreased concentration.   Past Medical History  Diagnosis Date  . Osteoarthritis   . Osteoporosis   . History of postmenopausal HRT   . MVP (mitral valve prolapse)     a. s/p MV repair at [facility] in 2008;  b. Echocardiogram 7/12: EF 50%, status post mitral valve repair with mild MR and minimal MS, mean gradient 4, moderate LAE, LA diam 55 mm; mild RAE, PASP 28-32;   Marland Kitchen Hypothyroidism   . Hx of adenomatous colonic polyps   . Collagenous colitis   . Atrial fibrillation     AFIB  . CVA (cerebral vascular accident)     a.  L parietal CVA by MRI 12/12, likely embolic;  b.  TEE by report with neg bubble study;  c.  carotid dopplers  12/12:  + plaque, no sig ICA stenosis     History   Social History  . Marital Status: Married    Spouse Name: N/A    Number of Children: N/A  . Years of Education: N/A   Occupational History  . RETIRED    Social History Main Topics  .  Smoking status: Former Smoker    Quit date: 04/28/1966  . Smokeless tobacco: Never Used  . Alcohol Use: 3.5 oz/week    7 drink(s) per week  . Drug Use: No  . Sexually Active: Not Currently   Other Topics Concern  . Not on file   Social History Narrative  . No narrative on file    Past Surgical History  Procedure Laterality Date  . Cholecystectomy    . Lumbar fusion    . Tonsillectomy    . Dilation and curettage of uterus    . Cataract extraction    . Mitral valve repair  2008  . Cardioversion  01/09/2012    Procedure: CARDIOVERSION;  Surgeon: Dolores Patty, MD;  Location: [facility] ENDOSCOPY;  Service: Cardiovascular;  Laterality: N/A;    Family History  Problem Relation Age of Onset  . Cancer Father     COLON  . Stroke Neg Hx     1ST DEGREE RELATIVE <60    Allergies  Allergen Reactions  . Rofecoxib     REACTION: Elevated LFT's    Current Outpatient Prescriptions on File Prior to Visit  Medication Sig Dispense Refill  . Ascorbic Acid (VITAMIN C) 500 MG tablet Take 500 mg by mouth daily.        Marland Kitchen atorvastatin (LIPITOR) 20 MG tablet Take 1 tablet (20 mg total) by mouth daily.  10 tablet  3  . Calcium Carbonate-Vitamin D (CALTRATE 600+D)  600-400 MG-UNIT per tablet Take 1 tablet by mouth 2 (two) times daily.        . Cholecalciferol (VITAMIN D) 2000 UNITS CAPS Take 1 capsule by mouth daily.        . clindamycin (CLEOCIN T) 1 % lotion Apply 1 application topically at bedtime. Apply to face      . cyclobenzaprine (FLEXERIL) 5 MG tablet Take 1 tablet (5 mg total) by mouth at bedtime as needed for muscle spasms.  90 tablet  3  . etodolac (LODINE) 400 MG tablet Take 400 mg by mouth 2 (two) times daily.      . fish oil-omega-3 fatty acids 1000 MG capsule Take 1 g by mouth 2 (two) times daily.       . Folic Acid-Vit B6-Vit B12 (FOLBEE) 2.5-25-1 MG TABS Take 1 tablet by mouth daily.      Marland Kitchen glucosamine-chondroitin 500-400 MG tablet Take 1 tablet by mouth 2 (two) times daily.         Marland Kitchen levothyroxine (SYNTHROID, LEVOTHROID) 75 MCG tablet Take 1 tablet (75 mcg total) by mouth daily.  90 tablet  3  . metoprolol (LOPRESSOR) 100 MG tablet Take 100 mg by mouth 2 (two) times daily.      . Multiple Vitamin (MULTIVITAMIN) tablet Take 1 tablet by mouth daily.        . Multiple Vitamins-Minerals (ICAPS AREDS FORMULA PO) Take 2 capsules by mouth daily.      . ondansetron (ZOFRAN) 8 MG tablet Take 1 tablet (8 mg total) by mouth every 8 (eight) hours as needed for nausea.  20 tablet  0  . traMADol (ULTRAM) 50 MG tablet Take 1 tablet (50 mg total) by mouth every 8 (eight) hours as needed for pain.  60 tablet  3  . vitamin A 8000 UNIT capsule Take 8,000 Units by mouth daily.      Marland Kitchen warfarin (COUMADIN) 5 MG tablet Take 2.5-5 mg by mouth daily. Takes 5mg  daily except 2.5mg  on Monday and thursday       No current facility-administered medications on file prior to visit.    BP 136/84  Pulse 80  Temp(Src) 98.1 F (36.7 C)  Resp 16  Ht 5' 1.75" (1.568 m)  Wt 110 lb (49.896 kg)  BMI 20.29 kg/m2        Objective:   Physical Exam  Nursing note and vitals reviewed. Constitutional: She is oriented to person, place, and time. She appears well-developed and well-nourished. No distress.  HENT:  Head: Normocephalic and atraumatic.  Eyes: Conjunctivae and EOM are normal. Pupils are equal, round, and reactive to light.  Neck: Normal range of motion. Neck supple. No JVD present. No tracheal deviation present. No thyromegaly present.  Cardiovascular: Normal rate and regular rhythm.   Murmur heard. Pulmonary/Chest: Effort normal and breath sounds normal. She has no wheezes. She exhibits no tenderness.  Abdominal: Soft. Bowel sounds are normal.  Musculoskeletal: Normal range of motion. She exhibits no edema and no tenderness.  Lymphadenopathy:    She has no cervical adenopathy.  Neurological: She is alert and oriented to person, place, and time. She has normal reflexes. No cranial nerve  deficit.  Skin: Skin is warm and dry. She is not diaphoretic.          Assessment & Plan:  Continue to adjust thyroid we have lowered the dose to 75 mcg with an excellent TSH T3 free and T4 free.  Blood pressure stable his current as well as pulse. Patient  is stable affect from the stroke with mild dysarthria.  Patient had recent adjustment of Coumadin therapy for chronic atrial fibrillation and has followup Coumadin adjustment visit on May 15

## 2012-07-29 ENCOUNTER — Ambulatory Visit (INDEPENDENT_AMBULATORY_CARE_PROVIDER_SITE_OTHER): Payer: Medicare Other | Admitting: Family

## 2012-07-29 ENCOUNTER — Encounter: Payer: Medicare Other | Admitting: Family

## 2012-07-29 DIAGNOSIS — Z8673 Personal history of transient ischemic attack (TIA), and cerebral infarction without residual deficits: Secondary | ICD-10-CM

## 2012-07-29 DIAGNOSIS — I4891 Unspecified atrial fibrillation: Secondary | ICD-10-CM

## 2012-07-29 NOTE — Patient Instructions (Addendum)
Take an extra 1/2 tab today. Continue on same dosage 5mg  daily except 2.5mg  on Mondays.  Recheck in 3 weeks.   Anticoagulation Dose Instructions as of 07/29/2012     Glynis Smiles Tue Wed Thu Fri Sat   New Dose 5 mg 2.5 mg 5 mg 5 mg 5 mg 5 mg 5 mg    Description       Take an extra 1/2 tab today. Continue on same dosage 5mg  daily except 2.5mg  on Mondays.  Recheck in 3 weeks.

## 2012-08-03 ENCOUNTER — Other Ambulatory Visit: Payer: Self-pay | Admitting: *Deleted

## 2012-08-03 DIAGNOSIS — M19049 Primary osteoarthritis, unspecified hand: Secondary | ICD-10-CM

## 2012-08-03 MED ORDER — CYCLOBENZAPRINE HCL 5 MG PO TABS: 5.0000 mg | ORAL_TABLET | Freq: Every evening | ORAL | Status: DC | PRN

## 2012-08-03 MED ORDER — ETODOLAC 400 MG PO TABS: 400.0000 mg | ORAL_TABLET | Freq: Two times a day (BID) | ORAL | Status: DC

## 2012-08-19 ENCOUNTER — Ambulatory Visit (INDEPENDENT_AMBULATORY_CARE_PROVIDER_SITE_OTHER): Payer: Medicare Other | Admitting: Family

## 2012-08-19 DIAGNOSIS — I4891 Unspecified atrial fibrillation: Secondary | ICD-10-CM

## 2012-08-19 DIAGNOSIS — Z8673 Personal history of transient ischemic attack (TIA), and cerebral infarction without residual deficits: Secondary | ICD-10-CM

## 2012-08-19 LAB — POCT INR: INR: 2

## 2012-08-19 NOTE — Patient Instructions (Addendum)
Continue on same dosage 5mg  daily except 2.5mg  on Mondays.  Recheck in 4 weeks.   Anticoagulation Dose Instructions as of 08/19/2012     Glynis Smiles Tue Wed Thu Fri Sat   New Dose 5 mg 2.5 mg 5 mg 5 mg 5 mg 5 mg 5 mg    Description       Continue on same dosage 5mg  daily except 2.5mg  on Mondays.  Recheck in 4 weeks.

## 2012-09-16 ENCOUNTER — Ambulatory Visit (INDEPENDENT_AMBULATORY_CARE_PROVIDER_SITE_OTHER): Payer: Medicare Other | Admitting: Family

## 2012-09-16 DIAGNOSIS — Z8673 Personal history of transient ischemic attack (TIA), and cerebral infarction without residual deficits: Secondary | ICD-10-CM

## 2012-09-16 NOTE — Patient Instructions (Addendum)
Continue on same dosage 5mg  daily except 2.5mg  on Mondays.  Recheck in 4 weeks.   Anticoagulation Dose Instructions as of 09/16/2012     Glynis Smiles Tue Wed Thu Fri Sat   New Dose 5 mg 2.5 mg 5 mg 5 mg 5 mg 5 mg 5 mg    Description       Continue on same dosage 5mg  daily except 2.5mg  on Mondays.  Recheck in 4 weeks.

## 2012-09-29 ENCOUNTER — Telehealth: Payer: Self-pay | Admitting: Internal Medicine

## 2012-09-29 ENCOUNTER — Telehealth: Payer: Self-pay | Admitting: Cardiovascular Disease

## 2012-09-29 MED ORDER — LEVOTHYROXINE SODIUM 75 MCG PO TABS: 75.0000 ug | ORAL_TABLET | Freq: Every day | ORAL | Status: DC

## 2012-09-29 NOTE — Telephone Encounter (Signed)
I spoke with the pt and she said that her irregular heart beat has been off and on for some time. I offered to have the pt come into the office today for an EKG but the pt refused at this time stating that she "did not feel this was urgent". The pt said Dr York Ram had just called her back and they have scheduled her to be seen on 10/07/12 at 2:30.  I advised the pt to call our office with any other questions or concerns.  Pt agreed.

## 2012-09-29 NOTE — Telephone Encounter (Signed)
Patient Information:  Caller Name: Ailie  Phone: [phone]  Patient: Theresa [name], Theresa [name]  Gender: Female  DOB: [birth year]  Age: 79 Years  PCP: Darryll Capers (Adults only)  Office Follow Up:  Does the office need to follow up with this patient?: Yes  Instructions For The Office: Please call back ASAP regarding work in appointment today; prefers to see only Dr Lovell Sheehan.  RN Note:  No dizziness present today.  Went for 2 miles walk this morning.  Last carioversion in October 2013. Erratic pulse present with tachycardia. All appointments for 09/29/12 are full.  reqeusts to see only dr Lovell Sheehan.  Symptoms  Reason For Call & Symptoms: Intermittent dizziness, episodes of hypotension and irregular pulse noted on BP machine.  BP this morning after 2 mile walk; 97/69 P 113 & irregular at 0845. Pulse registered irregular 4 times out of 10 times 09/28/12.  Reviewed Health History In EMR: Yes  Reviewed Medications In EMR: Yes  Reviewed Allergies In EMR: Yes  Reviewed Surgeries / Procedures: Yes  Date of Onset of Symptoms: 09/15/2012  Treatments Tried: drinking > fluids  Treatments Tried Worked: No  Guideline(s) Used:  Heart Rate and Heartbeat Questions  Disposition Per Guideline:   See Today in Office  Reason For Disposition Reached:   History of heart disease (i.e., heart attack, bypass surgery, angina, angioplasty)  Advice Given:  Reassurance  Occasional extra heart beats are experienced by most everyone. Lack of sleep, stress, and caffeinated beverages can make palpitations worse.  Health Basics  Sleep: Try to get sufficient amount of sleep. Lack of sleep can aggravate palpitations. Most people need 7-8 hours of sleep each night.  Avoid Caffeine  Avoid caffeine-containing beverages (Reason: caffeine is a stimulant and can aggravate palpitations).  Limit Alcohol:  Limit your alcohol consumption to no more than 2 drinks a day. Ideally, eliminate alcohol entirely for the next 2 weeks.  Call  Back If:  Chest pain, lightheadedness, or difficulty breathing occurs  Heart beating more than 130 beats / minute  More than 3 extra or skipped beats / minute  You become worse.  Patient Will Follow Care Advice:  YES

## 2012-09-29 NOTE — Telephone Encounter (Signed)
Appointment give for 7/24/at 2:30

## 2012-09-29 NOTE — Telephone Encounter (Signed)
New problem  Pt states she is having sporadic irregular heart beats. She said she spoke with her PCP this morning and was advised to call here.

## 2012-09-29 NOTE — Telephone Encounter (Signed)
Talked with pt and she was told ,she needs to call cardiologist

## 2012-10-07 ENCOUNTER — Other Ambulatory Visit: Payer: Self-pay | Admitting: *Deleted

## 2012-10-07 ENCOUNTER — Encounter: Payer: Self-pay | Admitting: Internal Medicine

## 2012-10-07 ENCOUNTER — Ambulatory Visit (INDEPENDENT_AMBULATORY_CARE_PROVIDER_SITE_OTHER): Payer: Self-pay | Admitting: Internal Medicine

## 2012-10-07 VITALS — BP 142/80 | HR 80 | Temp 98.2°F | Resp 16 | Ht 61.75 in | Wt 110.0 lb

## 2012-10-07 DIAGNOSIS — I10 Essential (primary) hypertension: Secondary | ICD-10-CM

## 2012-10-07 DIAGNOSIS — I482 Chronic atrial fibrillation, unspecified: Secondary | ICD-10-CM

## 2012-10-07 DIAGNOSIS — I4891 Unspecified atrial fibrillation: Secondary | ICD-10-CM

## 2012-10-07 LAB — BASIC METABOLIC PANEL
CO2: 30 mEq/L (ref 19–32)
Glucose, Bld: 111 mg/dL — ABNORMAL HIGH (ref 70–99)
Potassium: 5.2 mEq/L — ABNORMAL HIGH (ref 3.5–5.1)
Sodium: 141 mEq/L (ref 135–145)

## 2012-10-07 MED ORDER — DIGOXIN 125 MCG PO TABS: 0.0625 mg | ORAL_TABLET | Freq: Every day | ORAL | Status: DC

## 2012-10-07 MED ORDER — METOPROLOL TARTRATE 100 MG PO TABS: 100.0000 mg | ORAL_TABLET | Freq: Two times a day (BID) | ORAL | Status: DC

## 2012-10-07 NOTE — Progress Notes (Signed)
  Subjective:    Patient ID: Theresa [name], female    DOB: [birth year], 79 y.o.   MRN: [id number]  HPI Patient presents with mild dizziness but symptoms of irregular heart rate she has noticed a variation in her pulse she brings with her recording of several days her blood pressures that showed by her blood pressure monitor when she had a regular beats noted the irregular beats appeared to be higher than mid 70s although she did have one episode where her blood pressure was 99/64 with a reported pulse of 98 which I suspect was secondary to atrial fibrillation.     Review of Systems  Constitutional: Negative for activity change, appetite change and fatigue.  HENT: Negative for ear pain, congestion, neck pain, postnasal drip and sinus pressure.   Eyes: Negative for redness and visual disturbance.  Respiratory: Negative for cough and wheezing.   Cardiovascular: Positive for palpitations.  Gastrointestinal: Negative for abdominal pain and abdominal distention.  Genitourinary: Negative for dysuria, frequency and menstrual problem.  Musculoskeletal: Negative for myalgias, joint swelling and arthralgias.  Skin: Negative for rash and wound.  Neurological: Positive for light-headedness. Negative for dizziness, weakness and headaches.  Hematological: Negative for adenopathy. Does not bruise/bleed easily.  Psychiatric/Behavioral: Negative for sleep disturbance and decreased concentration.       Objective:   Physical Exam  Constitutional: She appears well-developed and well-nourished.  HENT:  Head: Atraumatic.  Eyes: Conjunctivae are normal. Pupils are equal, round, and reactive to light.  Cardiovascular:  Murmur heard. Irregular rate  Pulmonary/Chest: Effort normal and breath sounds normal.  Abdominal: Soft.  Skin: Skin is warm and dry.          Assessment & Plan:  Possible add digoxin After discussion with cardiology we will begin low-dose digoxin with a digoxin level in 3  weeks Has followup with cardiology plan she will report via my chart about what her rate is on her EKG machine and her blood pressure and she has hypotension were to slow heart rate we will discontinue the digoxin immediately Obtain a basic metabolic panel today to ensure that her renal function is stable  We will convert her from Coumadin to zarelto after obtaining a renal function Possible

## 2012-10-12 ENCOUNTER — Telehealth: Payer: Self-pay | Admitting: Internal Medicine

## 2012-10-12 NOTE — Telephone Encounter (Signed)
Talked with pt and her bp is running around 114/80 and pulse is in the 80-90 range. Pt advise dr Lovell Sheehan will return in am with answer about xarelto.

## 2012-10-12 NOTE — Telephone Encounter (Signed)
Per dr Dorothyann Peng is 2.1- therefore take 5 mg today and tomorrow take 10 mg,then on Thursday 7/31 come in for inr and if inr is 2.5 ,may stop coumadin and start xaralto 20 mg q d.  Pt  Is aware.

## 2012-10-12 NOTE — Telephone Encounter (Signed)
PT called regarding the instructions that Dr. Lovell Sheehan sent her through [facility]. Pt would like to speak with him regarding changing her medication. Please assist.

## 2012-10-13 ENCOUNTER — Other Ambulatory Visit: Payer: Self-pay | Admitting: *Deleted

## 2012-10-14 ENCOUNTER — Ambulatory Visit (INDEPENDENT_AMBULATORY_CARE_PROVIDER_SITE_OTHER): Payer: Self-pay | Admitting: General Practice

## 2012-10-14 ENCOUNTER — Other Ambulatory Visit: Payer: Self-pay | Admitting: General Practice

## 2012-10-14 DIAGNOSIS — Z8673 Personal history of transient ischemic attack (TIA), and cerebral infarction without residual deficits: Secondary | ICD-10-CM

## 2012-10-14 MED ORDER — RIVAROXABAN 20 MG PO TABS: 20.0000 mg | ORAL_TABLET | Freq: Every day | ORAL | Status: DC

## 2012-10-19 ENCOUNTER — Other Ambulatory Visit: Payer: Self-pay | Admitting: *Deleted

## 2012-10-19 MED ORDER — FOLIC ACID-VIT B6-VIT B12 2.5-25-1 MG PO TABS: 1.0000 | ORAL_TABLET | Freq: Every day | ORAL | Status: DC

## 2012-10-28 ENCOUNTER — Other Ambulatory Visit: Payer: Self-pay

## 2012-10-28 DIAGNOSIS — I482 Chronic atrial fibrillation, unspecified: Secondary | ICD-10-CM

## 2012-11-08 ENCOUNTER — Encounter: Payer: Self-pay | Admitting: Internal Medicine

## 2012-11-08 ENCOUNTER — Ambulatory Visit (INDEPENDENT_AMBULATORY_CARE_PROVIDER_SITE_OTHER): Payer: Self-pay | Admitting: Internal Medicine

## 2012-11-08 VITALS — BP 110/70 | HR 64 | Temp 98.2°F | Resp 16 | Ht 61.75 in | Wt 110.0 lb

## 2012-11-08 DIAGNOSIS — T887XXA Unspecified adverse effect of drug or medicament, initial encounter: Secondary | ICD-10-CM

## 2012-11-08 DIAGNOSIS — I480 Paroxysmal atrial fibrillation: Secondary | ICD-10-CM

## 2012-11-08 DIAGNOSIS — E039 Hypothyroidism, unspecified: Secondary | ICD-10-CM

## 2012-11-08 DIAGNOSIS — G4700 Insomnia, unspecified: Secondary | ICD-10-CM

## 2012-11-08 DIAGNOSIS — L719 Rosacea, unspecified: Secondary | ICD-10-CM

## 2012-11-08 DIAGNOSIS — I4891 Unspecified atrial fibrillation: Secondary | ICD-10-CM

## 2012-11-08 MED ORDER — CLINDAMYCIN PHOSPHATE 1 % EX LOTN: 1.0000 "application " | TOPICAL_LOTION | Freq: Two times a day (BID) | CUTANEOUS | Status: DC

## 2012-11-08 MED ORDER — CYCLOBENZAPRINE HCL 5 MG PO TABS: 5.0000 mg | ORAL_TABLET | Freq: Every evening | ORAL | Status: DC | PRN

## 2012-11-08 NOTE — Patient Instructions (Signed)
The patient is instructed to continue all medications as prescribed. Schedule followup with check out clerk upon leaving the clinic  

## 2012-11-08 NOTE — Progress Notes (Signed)
Subjective:    Patient ID: Theresa [name], female    DOB: June 11, 1931, 79 y.o.   MRN: [id number]  HPI Atrial fibrillation Stable HTN Discussion of clindamycin lotion for facial rash Added digoxin with signification resolution of PAF episodes Taking flexiril for sleep and has noted groggy feelings in AM   Review of Systems  Constitutional: Negative for activity change, appetite change and fatigue.  HENT: Negative for ear pain, congestion, neck pain, postnasal drip and sinus pressure.   Eyes: Negative for redness and visual disturbance.  Respiratory: Negative for cough, shortness of breath and wheezing.   Gastrointestinal: Negative for abdominal pain and abdominal distention.  Genitourinary: Negative for dysuria, frequency and menstrual problem.  Musculoskeletal: Negative for myalgias, joint swelling and arthralgias.  Skin: Negative for rash and wound.  Neurological: Negative for dizziness, weakness and headaches.  Hematological: Negative for adenopathy. Does not bruise/bleed easily.  Psychiatric/Behavioral: Negative for sleep disturbance and decreased concentration.   Past Medical History  Diagnosis Date  . Osteoarthritis   . Osteoporosis   . History of postmenopausal HRT   . MVP (mitral valve prolapse)     a. s/p MV repair at [facility] in 2008;  b. Echocardiogram 7/12: EF 50%, status post mitral valve repair with mild MR and minimal MS, mean gradient 4, moderate LAE, LA diam 55 mm; mild RAE, PASP 28-32;   Marland Kitchen Hypothyroidism   . Hx of adenomatous colonic polyps   . Collagenous colitis   . Atrial fibrillation     AFIB  . CVA (cerebral vascular accident)     a.  L parietal CVA by MRI 12/12, likely embolic;  b.  TEE by report with neg bubble study;  c.  carotid dopplers  12/12:  + plaque, no sig ICA stenosis     History   Social History  . Marital Status: Married    Spouse Name: N/A    Number of Children: N/A  . Years of Education: N/A   Occupational History  . RETIRED     Social History Main Topics  . Smoking status: Former Smoker    Quit date: 04/28/1966  . Smokeless tobacco: Never Used  . Alcohol Use: 3.5 oz/week    7 drink(s) per week  . Drug Use: No  . Sexual Activity: Not Currently   Other Topics Concern  . Not on file   Social History Narrative  . No narrative on file    Past Surgical History  Procedure Laterality Date  . Cholecystectomy    . Lumbar fusion    . Tonsillectomy    . Dilation and curettage of uterus    . Cataract extraction    . Mitral valve repair  2008  . Cardioversion  01/09/2012    Procedure: CARDIOVERSION;  Surgeon: Dolores Patty, MD;  Location: [facility] ENDOSCOPY;  Service: Cardiovascular;  Laterality: N/A;    Family History  Problem Relation Age of Onset  . Cancer Father     COLON  . Stroke Neg Hx     1ST DEGREE RELATIVE <60    Allergies  Allergen Reactions  . Rofecoxib     REACTION: Elevated LFT's    Current Outpatient Prescriptions on File Prior to Visit  Medication Sig Dispense Refill  . Ascorbic Acid (VITAMIN C) 500 MG tablet Take 500 mg by mouth daily.        Marland Kitchen atorvastatin (LIPITOR) 20 MG tablet Take 1 tablet (20 mg total) by mouth daily.  10 tablet  3  . Calcium  Carbonate-Vitamin D (CALTRATE 600+D) 600-400 MG-UNIT per tablet Take 1 tablet by mouth 2 (two) times daily.        . Cholecalciferol (VITAMIN D) 2000 UNITS CAPS Take 1 capsule by mouth daily.        . digoxin (LANOXIN) 0.125 MG tablet Take 0.5 tablets (0.0625 mg total) by mouth daily.  30 tablet  3  . etodolac (LODINE) 400 MG tablet Take 1 tablet (400 mg total) by mouth 2 (two) times daily.  180 tablet  3  . fish oil-omega-3 fatty acids 1000 MG capsule Take 1 g by mouth 2 (two) times daily.       . Folic Acid-Vit B6-Vit B12 (FOLBEE) 2.5-25-1 MG TABS tablet Take 1 tablet by mouth daily.  30 tablet  11  . glucosamine-chondroitin 500-400 MG tablet Take 1 tablet by mouth 2 (two) times daily.        Marland Kitchen levothyroxine (SYNTHROID, LEVOTHROID) 75  MCG tablet Take 75 mcg by mouth daily before breakfast.      . metoprolol (LOPRESSOR) 100 MG tablet Take 1 tablet (100 mg total) by mouth 2 (two) times daily.  180 tablet  3  . Multiple Vitamin (MULTIVITAMIN) tablet Take 1 tablet by mouth daily.        . Multiple Vitamins-Minerals (ICAPS AREDS FORMULA PO) Take 2 capsules by mouth daily.      . ondansetron (ZOFRAN) 8 MG tablet Take 1 tablet (8 mg total) by mouth every 8 (eight) hours as needed for nausea.  20 tablet  0  . Rivaroxaban (XARELTO) 20 MG TABS Take 1 tablet (20 mg total) by mouth daily.  30 tablet  2  . traMADol (ULTRAM) 50 MG tablet Take 1 tablet (50 mg total) by mouth every 8 (eight) hours as needed for pain.  60 tablet  3  . vitamin A 8000 UNIT capsule Take 8,000 Units by mouth daily.       No current facility-administered medications on file prior to visit.    BP 110/70  Pulse 64  Temp(Src) 98.2 F (36.8 C)  Resp 16  Ht 5' 1.75" (1.568 m)  Wt 110 lb (49.896 kg)  BMI 20.29 kg/m2       Objective:   Physical Exam  Constitutional: She is oriented to person, place, and time. She appears well-developed and well-nourished. No distress.  HENT:  Head: Normocephalic and atraumatic.  Eyes: Conjunctivae and EOM are normal. Pupils are equal, round, and reactive to light.  Neck: Normal range of motion. Neck supple. No JVD present. No tracheal deviation present. No thyromegaly present.  Cardiovascular: Normal rate and regular rhythm.   No murmur heard. Pulmonary/Chest: Effort normal and breath sounds normal. She has no wheezes. She exhibits no tenderness.  Abdominal: Soft. Bowel sounds are normal.  Musculoskeletal: Normal range of motion. She exhibits no edema and no tenderness.  Lymphadenopathy:    She has no cervical adenopathy.  Neurological: She is alert and oriented to person, place, and time. She has normal reflexes. No cranial nerve deficit.  dysarthria  Skin: Skin is warm and dry. She is not diaphoretic.  Psychiatric:  She has a normal mood and affect. Her behavior is normal.          Assessment & Plan:  CBC and digoxin level Dig level at 0.8 Rate stable in SR today and palpitations have resolved! Blood pressures stable  insomnia Continue low dose dig!

## 2012-11-11 ENCOUNTER — Encounter: Payer: Self-pay | Admitting: Cardiovascular Disease

## 2012-11-11 ENCOUNTER — Ambulatory Visit (INDEPENDENT_AMBULATORY_CARE_PROVIDER_SITE_OTHER): Payer: Self-pay | Admitting: Cardiovascular Disease

## 2012-11-11 VITALS — BP 138/66 | HR 66 | Wt 108.0 lb

## 2012-11-11 DIAGNOSIS — I059 Rheumatic mitral valve disease, unspecified: Secondary | ICD-10-CM

## 2012-11-11 DIAGNOSIS — Z8673 Personal history of transient ischemic attack (TIA), and cerebral infarction without residual deficits: Secondary | ICD-10-CM

## 2012-11-11 DIAGNOSIS — I4891 Unspecified atrial fibrillation: Secondary | ICD-10-CM

## 2012-11-11 NOTE — Assessment & Plan Note (Signed)
No recurrence on anticoagulation

## 2012-11-11 NOTE — Assessment & Plan Note (Signed)
S/P repair with trivial residual MR on echo 2013  Stable SBE prophylaxis

## 2012-11-11 NOTE — Patient Instructions (Signed)
Your physician wants you to follow-up in:  6 MONTHS WITH DR NISHAN  You will receive a reminder letter in the mail two months in advance. If you don't receive a letter, please call our office to schedule the follow-up appointment. Your physician recommends that you continue on your current medications as directed. Please refer to the Current Medication list given to you today. 

## 2012-11-11 NOTE — Assessment & Plan Note (Signed)
Continue anticoagulation and rate control drugs Seems to be in NSR today.  She records her BP at home and seems to think she hasn't had afib in over a month

## 2012-11-11 NOTE — Progress Notes (Signed)
Patient ID: Theresa [name], female   DOB: 09-Nov-1931, 79 y.o.   MRN: [id number] She has a history of mitral regurgitation, status post mitral valve repair at [facility] in 2008, paroxysmal atrial fibrillation, prior stroke in 12/12 at [facility] (felt to be embolic), on chronic Coumadin therapy, colitis, Raynaud's. She was seen in the emergency room 11/13/11 with dizziness. Head CT demonstrated atrophy and chronic microvascular ischemic changes, no acute findings. MRI did not show anything acute. EKG demonstrated atrial fibrillation with a heart rate of 122. She did see her PCP in followup who adjusted her Toprol for rate control. Subsequently cardioverted by Dr Tenny Craw 10/13  Echo 11/26/11  Study Conclusions  - Left ventricle: The cavity size was normal. Wall thickness was normal. The estimated ejection fraction was 50%. Diffuse hypokinesis. - Mitral valve: S/P repair with mild residual MR Mild regurgitation. - Left atrium: The atrium was moderately to severely dilated. - Atrial septum: No defect or patent foramen ovale was identified.  BP stable with beta blocker  Seems like she has infrequent PAF  ROS: Denies fever, malais, weight loss, blurry vision, decreased visual acuity, cough, sputum, SOB, hemoptysis, pleuritic pain, palpitaitons, heartburn, abdominal pain, melena, lower extremity edema, claudication, or rash.  All other systems reviewed and negative  General: Affect appropriate Frail elderly female with arthritic hand changes HEENT: normal Neck supple with no adenopathy JVP normal no bruits no thyromegaly Lungs clear with no wheezing and good diaphragmatic motion Heart:  S1/S2 no murmur, no rub, gallop or click PMI normal Abdomen: benighn, BS positve, no tenderness, no AAA no bruit.  No HSM or HJR Distal pulses intact with no bruits No edema Neuro non-focal Skin warm and dry No muscular weakness   Current Outpatient Prescriptions  Medication Sig Dispense  Refill  . Ascorbic Acid (VITAMIN C) 500 MG tablet Take 500 mg by mouth daily.        Marland Kitchen atorvastatin (LIPITOR) 20 MG tablet Take 1 tablet (20 mg total) by mouth daily.  10 tablet  3  . Calcium Carbonate-Vitamin D (CALTRATE 600+D) 600-400 MG-UNIT per tablet Take 1 tablet by mouth 2 (two) times daily.        . Cholecalciferol (VITAMIN D) 2000 UNITS CAPS Take 1 capsule by mouth daily.        . clindamycin (CLEOCIN T) 1 % lotion Apply 1 application topically 2 (two) times daily. Apply to face for rosacea  60 mL  3  . cyclobenzaprine (FLEXERIL) 5 MG tablet Take 1 tablet (5 mg total) by mouth at bedtime as needed and may repeat dose one time if needed for muscle spasms.  30 tablet  1  . digoxin (LANOXIN) 0.125 MG tablet Take 0.5 tablets (0.0625 mg total) by mouth daily.  30 tablet  3  . Esomeprazole Magnesium (NEXIUM PO) Take 1 tablet by mouth daily.      Marland Kitchen etodolac (LODINE) 400 MG tablet Take 1 tablet (400 mg total) by mouth 2 (two) times daily.  180 tablet  3  . fish oil-omega-3 fatty acids 1000 MG capsule Take 1 g by mouth 2 (two) times daily.       . Folic Acid-Vit B6-Vit B12 (FOLBEE) 2.5-25-1 MG TABS tablet Take 1 tablet by mouth daily.  30 tablet  11  . glucosamine-chondroitin 500-400 MG tablet Take 1 tablet by mouth 2 (two) times daily.        Marland Kitchen levothyroxine (SYNTHROID, LEVOTHROID) 75 MCG tablet Take 75 mcg by mouth daily before breakfast.      .  metoprolol (LOPRESSOR) 100 MG tablet Take 1 tablet (100 mg total) by mouth 2 (two) times daily.  180 tablet  3  . Multiple Vitamin (MULTIVITAMIN) tablet Take 1 tablet by mouth daily.        . Multiple Vitamins-Minerals (ICAPS AREDS FORMULA PO) Take 2 capsules by mouth daily.      . ondansetron (ZOFRAN) 8 MG tablet Take 1 tablet (8 mg total) by mouth every 8 (eight) hours as needed for nausea.  20 tablet  0  . Rivaroxaban (XARELTO) 20 MG TABS Take 1 tablet (20 mg total) by mouth daily.  30 tablet  2  . traMADol (ULTRAM) 50 MG tablet Take 1 tablet (50 mg  total) by mouth every 8 (eight) hours as needed for pain.  60 tablet  3  . vitamin A 8000 UNIT capsule Take 8,000 Units by mouth daily.       No current facility-administered medications for this visit.    Allergies  Rofecoxib  Electrocardiogram: 7/24  Afib rate 82 RSR'  Assessment and Plan

## 2012-11-17 ENCOUNTER — Encounter: Payer: Self-pay | Admitting: Internal Medicine

## 2012-12-10 ENCOUNTER — Telehealth: Payer: Self-pay | Admitting: Internal Medicine

## 2012-12-10 DIAGNOSIS — M419 Scoliosis, unspecified: Secondary | ICD-10-CM

## 2012-12-10 MED ORDER — TRAMADOL HCL 50 MG PO TABS: 50.0000 mg | ORAL_TABLET | Freq: Three times a day (TID) | ORAL | Status: DC | PRN

## 2012-12-10 NOTE — Telephone Encounter (Signed)
rx sent in electronically 

## 2012-12-10 NOTE — Telephone Encounter (Signed)
Pt states that she requested her traMADol (ULTRAM) 50 MG tablet to be refilled, through [facility] [address]. I do not see a refill on file. Please assist.

## 2012-12-14 ENCOUNTER — Other Ambulatory Visit: Payer: Self-pay | Admitting: *Deleted

## 2012-12-14 DIAGNOSIS — M419 Scoliosis, unspecified: Secondary | ICD-10-CM

## 2012-12-14 MED ORDER — TRAMADOL HCL 50 MG PO TABS: 50.0000 mg | ORAL_TABLET | Freq: Three times a day (TID) | ORAL | Status: DC | PRN

## 2012-12-22 ENCOUNTER — Ambulatory Visit (INDEPENDENT_AMBULATORY_CARE_PROVIDER_SITE_OTHER): Payer: Medicare Other

## 2012-12-22 DIAGNOSIS — Z23 Encounter for immunization: Secondary | ICD-10-CM

## 2013-01-10 ENCOUNTER — Other Ambulatory Visit (INDEPENDENT_AMBULATORY_CARE_PROVIDER_SITE_OTHER): Payer: Medicare Other

## 2013-01-10 DIAGNOSIS — E039 Hypothyroidism, unspecified: Secondary | ICD-10-CM

## 2013-01-10 DIAGNOSIS — T887XXA Unspecified adverse effect of drug or medicament, initial encounter: Secondary | ICD-10-CM

## 2013-01-10 DIAGNOSIS — I480 Paroxysmal atrial fibrillation: Secondary | ICD-10-CM

## 2013-01-10 LAB — CBC WITH DIFFERENTIAL/PLATELET
Basophils Absolute: 0.1 10*3/uL (ref 0.0–0.1)
Basophils Relative: 1.3 % (ref 0.0–3.0)
Eosinophils Relative: 3.2 % (ref 0.0–5.0)
Hemoglobin: 13.5 g/dL (ref 12.0–15.0)
Lymphocytes Relative: 17.1 % (ref 12.0–46.0)
Monocytes Relative: 7.9 % (ref 3.0–12.0)
Neutro Abs: 5.5 10*3/uL (ref 1.4–7.7)
RBC: 4.14 Mil/uL (ref 3.87–5.11)
RDW: 14.8 % — ABNORMAL HIGH (ref 11.5–14.6)
WBC: 7.8 10*3/uL (ref 4.5–10.5)

## 2013-01-10 LAB — BASIC METABOLIC PANEL
GFR: 58.79 mL/min — ABNORMAL LOW (ref >60.00)
Potassium: 4 mEq/L (ref 3.5–5.1)
Sodium: 138 mEq/L (ref 135–145)

## 2013-01-10 LAB — TSH: TSH: 1.71 u[IU]/mL (ref 0.35–5.50)

## 2013-01-12 ENCOUNTER — Telehealth: Payer: Self-pay | Admitting: *Deleted

## 2013-01-12 NOTE — Telephone Encounter (Signed)
See telephone message

## 2013-01-12 NOTE — Telephone Encounter (Signed)
Pt walked in with husband. Had just left dr Boykin Nearing ,opthamologist office.  Pt states she has had slurred speech and drooping mouth.  Dr Dagoberto Ligas wanted her to come here today.  Pt states this started about 3 weeks ago when she was on a cruise and she woke up with slurred speech and some blurred vision, they did check her out and told her she would be all right,but this sx continue, a nd when seeing dr Priscille Kluver today he wanted her to come her.  Tried to have patient go to ed,but refused,and tried to have her see md here and she refused.  States she waiting to see dr Lovell Sheehan at Monday ov..told pt to come in and see dr Lovell Sheehan in am at 8:30 , but go to ed if sx worsens. Pt promised. Sent message to dr Lovell Sheehan, and I thinks he preferred her to go to ed.  Called patient back and talked to husband told him that dr Lovell Sheehan, really wanted her to go to ed, so he states he will take her.  Her bp was 144/80 and pulse was 72.

## 2013-01-13 ENCOUNTER — Ambulatory Visit (INDEPENDENT_AMBULATORY_CARE_PROVIDER_SITE_OTHER): Payer: Medicare Other | Admitting: Internal Medicine

## 2013-01-13 ENCOUNTER — Encounter: Payer: Self-pay | Admitting: Internal Medicine

## 2013-01-13 VITALS — BP 126/78 | HR 72 | Temp 98.3°F | Resp 16 | Ht 61.75 in | Wt 107.0 lb

## 2013-01-13 DIAGNOSIS — I6990 Unspecified sequelae of unspecified cerebrovascular disease: Secondary | ICD-10-CM

## 2013-01-13 DIAGNOSIS — Z8673 Personal history of transient ischemic attack (TIA), and cerebral infarction without residual deficits: Secondary | ICD-10-CM

## 2013-01-13 DIAGNOSIS — I69922 Dysarthria following unspecified cerebrovascular disease: Secondary | ICD-10-CM

## 2013-01-13 DIAGNOSIS — R4182 Altered mental status, unspecified: Secondary | ICD-10-CM

## 2013-01-13 DIAGNOSIS — I10 Essential (primary) hypertension: Secondary | ICD-10-CM

## 2013-01-13 DIAGNOSIS — R739 Hyperglycemia, unspecified: Secondary | ICD-10-CM

## 2013-01-13 DIAGNOSIS — R7309 Other abnormal glucose: Secondary | ICD-10-CM

## 2013-01-13 DIAGNOSIS — G40209 Localization-related (focal) (partial) symptomatic epilepsy and epileptic syndromes with complex partial seizures, not intractable, without status epilepticus: Secondary | ICD-10-CM

## 2013-01-13 LAB — POCT URINALYSIS DIPSTICK
Blood, UA: NEGATIVE
Ketones, UA: NEGATIVE
Protein, UA: NEGATIVE
pH, UA: 5.5

## 2013-01-13 NOTE — Progress Notes (Signed)
Subjective:    Patient ID: Theresa [name], female    DOB: March 07, 1932, 79 y.o.   MRN: [id number]  HPI Patient had an episode of slurred speech two weeks ago She was seen to nod off and had transient LOC with difficulty in stimulation. Afterward she was "fine". The next morning she was disoriented and had confused speech and lost of time orientation. She recovered slowed and has periodic episodes of confusion after sleep. She was seen by EMS in [place] and she was not seen to have focal findings.  Prior to the trip she had some episodes of ST memory loss and episodes of confusion around operating common objects such as a telephone  Increased pain and numbness in fingers with severe OA/RA  Today slight increase in facial droop and slurring of speech.     Review of Systems  Constitutional: Positive for activity change, fatigue and unexpected weight change.  HENT: Positive for sinus pressure.   Eyes: Negative.   Respiratory: Negative.   Cardiovascular: Negative.   Gastrointestinal: Negative.   Endocrine:       Possible hypoglycemia  Genitourinary: Negative for urgency and frequency.  Neurological: Positive for seizures, facial asymmetry and numbness.       Possible seizure activity  Hematological: Bruises/bleeds easily.  Psychiatric/Behavioral: Positive for sleep disturbance and decreased concentration. The patient is nervous/anxious.    Past Medical History  Diagnosis Date  . Osteoarthritis   . Osteoporosis   . History of postmenopausal HRT   . MVP (mitral valve prolapse)     a. s/p MV repair at [facility] in 2008;  b. Echocardiogram 7/12: EF 50%, status post mitral valve repair with mild MR and minimal MS, mean gradient 4, moderate LAE, LA diam 55 mm; mild RAE, PASP 28-32;   Marland Kitchen Hypothyroidism   . Hx of adenomatous colonic polyps   . Collagenous colitis   . Atrial fibrillation     AFIB  . CVA (cerebral vascular accident)     a.  L parietal CVA by MRI 12/12, likely embolic;  b.   TEE by report with neg bubble study;  c.  carotid dopplers  12/12:  + plaque, no sig ICA stenosis     History   Social History  . Marital Status: Married    Spouse Name: N/A    Number of Children: N/A  . Years of Education: N/A   Occupational History  . RETIRED    Social History Main Topics  . Smoking status: Former Smoker    Quit date: 04/28/1966  . Smokeless tobacco: Never Used  . Alcohol Use: 3.5 oz/week    7 drink(s) per week  . Drug Use: No  . Sexual Activity: Not Currently   Other Topics Concern  . Not on file   Social History Narrative  . No narrative on file    Past Surgical History  Procedure Laterality Date  . Cholecystectomy    . Lumbar fusion    . Tonsillectomy    . Dilation and curettage of uterus    . Cataract extraction    . Mitral valve repair  2008  . Cardioversion  01/09/2012    Procedure: CARDIOVERSION;  Surgeon: Dolores Patty, MD;  Location: [facility] ENDOSCOPY;  Service: Cardiovascular;  Laterality: N/A;    Family History  Problem Relation Age of Onset  . Cancer Father     COLON  . Stroke Neg Hx     1ST DEGREE RELATIVE <60    Allergies  Allergen Reactions  .  Rofecoxib     REACTION: Elevated LFT's    Current Outpatient Prescriptions on File Prior to Visit  Medication Sig Dispense Refill  . Ascorbic Acid (VITAMIN C) 500 MG tablet Take 500 mg by mouth daily.        Marland Kitchen atorvastatin (LIPITOR) 20 MG tablet Take 1 tablet (20 mg total) by mouth daily.  10 tablet  3  . Calcium Carbonate-Vitamin D (CALTRATE 600+D) 600-400 MG-UNIT per tablet Take 1 tablet by mouth 2 (two) times daily.        . Cholecalciferol (VITAMIN D) 2000 UNITS CAPS Take 1 capsule by mouth daily.        . clindamycin (CLEOCIN T) 1 % lotion Apply 1 application topically 2 (two) times daily. Apply to face for rosacea  60 mL  3  . cyclobenzaprine (FLEXERIL) 5 MG tablet Take 1 tablet (5 mg total) by mouth at bedtime as needed and may repeat dose one time if needed for muscle  spasms.  30 tablet  1  . digoxin (LANOXIN) 0.125 MG tablet Take 0.5 tablets (0.0625 mg total) by mouth daily.  30 tablet  3  . Esomeprazole Magnesium (NEXIUM PO) Take 1 tablet by mouth daily as needed.       . etodolac (LODINE) 400 MG tablet Take 1 tablet (400 mg total) by mouth 2 (two) times daily.  180 tablet  3  . fish oil-omega-3 fatty acids 1000 MG capsule Take 1 g by mouth 2 (two) times daily.       . Folic Acid-Vit B6-Vit B12 (FOLBEE) 2.5-25-1 MG TABS tablet Take 1 tablet by mouth daily.  30 tablet  11  . glucosamine-chondroitin 500-400 MG tablet Take 1 tablet by mouth 2 (two) times daily.        Marland Kitchen levothyroxine (SYNTHROID, LEVOTHROID) 75 MCG tablet Take 75 mcg by mouth daily before breakfast.      . metoprolol (LOPRESSOR) 100 MG tablet Take 1 tablet (100 mg total) by mouth 2 (two) times daily.  180 tablet  3  . Multiple Vitamin (MULTIVITAMIN) tablet Take 1 tablet by mouth daily.        . Multiple Vitamins-Minerals (ICAPS AREDS FORMULA PO) Take 2 capsules by mouth daily.      . ondansetron (ZOFRAN) 8 MG tablet Take 1 tablet (8 mg total) by mouth every 8 (eight) hours as needed for nausea.  20 tablet  0  . Rivaroxaban (XARELTO) 20 MG TABS Take 1 tablet (20 mg total) by mouth daily.  30 tablet  2  . traMADol (ULTRAM) 50 MG tablet Take 1 tablet (50 mg total) by mouth every 8 (eight) hours as needed for pain.  60 tablet  2  . vitamin A 8000 UNIT capsule Take 8,000 Units by mouth daily.       No current facility-administered medications on file prior to visit.    BP 126/78  Pulse 72  Temp(Src) 98.3 F (36.8 C)  Resp 16  Ht 5' 1.75" (1.568 m)  Wt 107 lb (48.535 kg)  BMI 19.74 kg/m2        Objective:   Physical Exam  Constitutional: She appears well-nourished. No distress.  HENT:  Head: Atraumatic.  Right Ear: External ear normal.  Left Ear: External ear normal.  Eyes: EOM are normal. Pupils are equal, round, and reactive to light.  Accommodation and ability to follow  Neck:  Neck supple.  Cardiovascular:  Murmur heard. Regular rate and rhythm  Ranging from 60-70 beats per minute  Pulmonary/Chest:  Breath sounds normal.  Abdominal: Bowel sounds are normal.  Musculoskeletal: She exhibits edema and tenderness.  Marked arthritic changes to the hands  Skin: Skin is warm and dry. She is not diaphoretic.  Psychiatric: She has a normal mood and affect. Her behavior is normal.          Assessment & Plan:  Stop the lodine and replace with 81 mg ASA Check labs for thyroid, CBC, CMET and digoxin Consider MRI and EEG Neurology referral ASAP Depression may be an overlay. Has changed from coumadin to xarelto  Differential diagnoses 1 seizure activity due to prior stroke 2 extension of prior stroke versus TIA 3 hypoglycemic episodes  We will check an A1c for the MRI and EEG and referral to neurology watch her diet eat 3 square meals and a  snack a day as well as avoid high glucose foods   I have spent more than 30 minutes examining this patient face-to-face of which over half was spent in counseling

## 2013-01-13 NOTE — Patient Instructions (Signed)
Stop the Lodine Begin 81 mg aspirin daily

## 2013-01-17 ENCOUNTER — Ambulatory Visit (INDEPENDENT_AMBULATORY_CARE_PROVIDER_SITE_OTHER): Payer: Medicare Other | Admitting: Neurology

## 2013-01-17 ENCOUNTER — Other Ambulatory Visit: Payer: Self-pay | Admitting: Internal Medicine

## 2013-01-17 ENCOUNTER — Encounter: Payer: Medicare Other | Admitting: Internal Medicine

## 2013-01-17 ENCOUNTER — Encounter: Payer: Self-pay | Admitting: Neurology

## 2013-01-17 VITALS — BP 144/80 | HR 62 | Temp 97.5°F | Ht 61.0 in | Wt 104.0 lb

## 2013-01-17 DIAGNOSIS — R55 Syncope and collapse: Secondary | ICD-10-CM

## 2013-01-17 DIAGNOSIS — F05 Delirium due to known physiological condition: Secondary | ICD-10-CM

## 2013-01-17 MED ORDER — METHYLPREDNISOLONE (PAK) 4 MG PO TABS: ORAL_TABLET | ORAL | Status: DC

## 2013-01-17 NOTE — Progress Notes (Signed)
NEUROLOGY CONSULTATION NOTE  PHYLLISTINE DOMINGOS MRN: [id number] DOB: [birth year]  Referring provider: Dr. Lovell Sheehan Primary care provider: Dr. Lovell Sheehan  Reason for consult:  Episode of loss of consciousness.  Episodes of confusion.  HISTORY OF PRESENT ILLNESS: Theresa [name] is a 79 year old right-hand woman with history of stroke, ophthalmic migraines, paroxysmal atrial fibrillation and MVP who presents for seizures.  He was previously followed by Dr. Denton Meek, who has since left [facility].  She is accompanied by her husband.  Records and images were personally reviewed where available.    About two weeks ago, while on a riverboat cruise in [place], she had a transient episode of loss of consciousness.  They were sitting in the lounge and she was looking at the newsletter.  She began nodding her head and she lost consciousness.  She remained in the chair, stiff and dropped the newsletter.  Her husband went to get help.  It lasted about 5 minutes and she woke up.  She was not confused when she woke up.  She did not have any convulsions, repetitive movements, urinary incontinence or tongue biting.  The next morning, she woke up and was confused.  She knew what words she wanted to say but had difficulty how to use them.  She understood her husband.  She was not amnestic to the event.  She thought she was still at home and didn't know they were already on the trip.  Once she and her husband left their cabin, she was fully oriented.  The following morning, she woke up early and was talking about taking souvenirs back home.  She fell back to sleep and later in the morning she was fine.  The EMS on the boat discussed sending her out to a hospital but felt that she was not in any danger at this point.  Since getting home, she has had difficulty performing simple daily tasks.  She has had difficulty operating the TV, the microwave, stove and the telephone.  She does not have any focal weakness.    She  sustained a left parietal ischemic stroke presenting as aphasia, right facial weakness and right hand numbness in December 2012, likely cardio-embolic.  She has subsequently been on anticoagulation and currently is on Xarelto.    Since the stroke, she would have periods of mild disorientation.  It is not too bad.  Usually, when she is riding with her husband, she is not sure if they should make a left or right to get to their destination.  This is something she used to be sharp about.  She has history of arthritis.  Over the past 2 days, she complains of neck and shoulder pain.  This correlates with stopping the Flexeril and etodolac  10/ 27 & 30/14:  Hgb A1c 5.6, TSH 1.71, WBC 7.8, Hgb 13.5, HCT 39.9, PLT 207, Na 138, K 4.0, glucose 136, BUN 25, Cr 1.0  11/13/11 MRI BRAIN WO: chronic left posterior frontal infarct with some remote blood products along the sylvian fissure.  PAST MEDICAL HISTORY: Past Medical History  Diagnosis Date  . Osteoarthritis   . Osteoporosis   . History of postmenopausal HRT   . MVP (mitral valve prolapse)     a. s/p MV repair at [facility] in 2008;  b. Echocardiogram 7/12: EF 50%, status post mitral valve repair with mild MR and minimal MS, mean gradient 4, moderate LAE, LA diam 55 mm; mild RAE, PASP 28-32;   Marland Kitchen Hypothyroidism   .  Hx of adenomatous colonic polyps   . Collagenous colitis   . Atrial fibrillation     AFIB  . CVA (cerebral vascular accident)     a.  L parietal CVA by MRI 12/12, likely embolic;  b.  TEE by report with neg bubble study;  c.  carotid dopplers  12/12:  + plaque, no sig ICA stenosis     PAST SURGICAL HISTORY: Past Surgical History  Procedure Laterality Date  . Cholecystectomy    . Lumbar fusion    . Tonsillectomy    . Dilation and curettage of uterus    . Cataract extraction    . Mitral valve repair  2008  . Cardioversion  01/09/2012    Procedure: CARDIOVERSION;  Surgeon: Dolores Patty, MD;  Location: [facility] ENDOSCOPY;  Service:  Cardiovascular;  Laterality: N/A;    MEDICATIONS: Current Outpatient Prescriptions on File Prior to Visit  Medication Sig Dispense Refill  . Ascorbic Acid (VITAMIN C) 500 MG tablet Take 500 mg by mouth daily.        Marland Kitchen atorvastatin (LIPITOR) 20 MG tablet Take 1 tablet (20 mg total) by mouth daily.  10 tablet  3  . Calcium Carbonate-Vitamin D (CALTRATE 600+D) 600-400 MG-UNIT per tablet Take 1 tablet by mouth 2 (two) times daily.        . Cholecalciferol (VITAMIN D) 2000 UNITS CAPS Take 1 capsule by mouth daily.        . clindamycin (CLEOCIN T) 1 % lotion Apply 1 application topically 2 (two) times daily. Apply to face for rosacea  60 mL  3  . digoxin (LANOXIN) 0.125 MG tablet Take 0.5 tablets (0.0625 mg total) by mouth daily.  30 tablet  3  . Esomeprazole Magnesium (NEXIUM PO) Take 1 tablet by mouth daily as needed.       . fish oil-omega-3 fatty acids 1000 MG capsule Take 1 g by mouth 2 (two) times daily.       . Folic Acid-Vit B6-Vit B12 (FOLBEE) 2.5-25-1 MG TABS tablet Take 1 tablet by mouth daily.  30 tablet  11  . glucosamine-chondroitin 500-400 MG tablet Take 1 tablet by mouth 2 (two) times daily.        Marland Kitchen levothyroxine (SYNTHROID, LEVOTHROID) 75 MCG tablet Take 75 mcg by mouth daily before breakfast.      . metoprolol (LOPRESSOR) 100 MG tablet Take 1 tablet (100 mg total) by mouth 2 (two) times daily.  180 tablet  3  . Multiple Vitamin (MULTIVITAMIN) tablet Take 1 tablet by mouth daily.        . Multiple Vitamins-Minerals (ICAPS AREDS FORMULA PO) Take 2 capsules by mouth daily.      . Rivaroxaban (XARELTO) 20 MG TABS Take 1 tablet (20 mg total) by mouth daily.  30 tablet  2  . vitamin A 8000 UNIT capsule Take 8,000 Units by mouth daily.      . ondansetron (ZOFRAN) 8 MG tablet Take 1 tablet (8 mg total) by mouth every 8 (eight) hours as needed for nausea.  20 tablet  0  . traMADol (ULTRAM) 50 MG tablet Take 1 tablet (50 mg total) by mouth every 8 (eight) hours as needed for pain.  60  tablet  2   No current facility-administered medications on file prior to visit.    ALLERGIES: Allergies  Allergen Reactions  . Rofecoxib     REACTION: Elevated LFT's    FAMILY HISTORY: Family History  Problem Relation Age of Onset  . Cancer  Father     COLON  . Stroke Neg Hx     1ST DEGREE RELATIVE <60    SOCIAL HISTORY: History   Social History  . Marital Status: Married    Spouse Name: N/A    Number of Children: N/A  . Years of Education: N/A   Occupational History  . RETIRED    Social History Main Topics  . Smoking status: Former Smoker    Quit date: 04/28/1966  . Smokeless tobacco: Never Used  . Alcohol Use: 3.5 oz/week    7 drink(s) per week     Comment: rarely  . Drug Use: No  . Sexual Activity: Not Currently   Other Topics Concern  . Not on file   Social History Narrative  . No narrative on file    REVIEW OF SYSTEMS: Constitutional: No fevers, chills, or sweats, no generalized fatigue, change in appetite Eyes: No visual changes, double vision, eye pain Ear, nose and throat: No hearing loss, ear pain, nasal congestion, sore throat Cardiovascular: No chest pain, palpitations Respiratory:  No shortness of breath at rest or with exertion, wheezes GastrointestinaI: No nausea, vomiting, diarrhea, abdominal pain, fecal incontinence Genitourinary:  No dysuria, urinary retention or frequency Musculoskeletal:  No neck pain, back pain Integumentary: No rash, pruritus, skin lesions Neurological: as above Psychiatric: No depression, insomnia, anxiety Endocrine: No palpitations, fatigue, diaphoresis, mood swings, change in appetite, change in weight, increased thirst Hematologic/Lymphatic:  No anemia, purpura, petechiae. Allergic/Immunologic: no itchy/runny eyes, nasal congestion, recent allergic reactions, rashes  PHYSICAL EXAM: Filed Vitals:   01/17/13 0748  BP: 144/80  Pulse: 62  Temp: 97.5 F (36.4 C)   General: No acute distress Head:   Normocephalic/atraumatic Neck: supple, no paraspinal tenderness, full range of motion Back: No paraspinal tenderness Heart: regular rate and rhythm Lungs: Clear to auscultation bilaterally. Vascular: No carotid bruits. Neurological Exam: Mental status: alert and oriented to person, place, and time, speech fluent and not dysarthric, language intact.  No hemineglect. Cranial nerves: CN I: not tested CN II: pupils equal, round and reactive to light, visual fields intact, fundi unremarkable. CN III, IV, VI:  full range of motion, no nystagmus, no ptosis CN V: facial sensation intact CN VII: reduced right nasolabial fold CN VIII: hearing intact CN IX, X: gag intact, uvula midline CN XI: sternocleidomastoid and trapezius muscles intact CN XII: tongue midline Bulk & Tone: normal, no fasciculations. Motor: 5/5 throughout Sensation: mild reduced vibration in toes.  Pinprick intact.  No extinction to double simultaneous tactile stimulation. Deep Tendon Reflexes: 1+ throughout, toes down Finger to nose testing: normal Heel to shin: normal Gait: normal stance, side bend of torso to the left.  No ataxia.  Able to walk in tandem. Romberg negative.  IMPRESSION: Episode of loss of consciousness and episodes of confusion.  With history of stroke, a seizure is a possibility.  However, since she has persistent symptoms consistent with apraxia, so I suspect more likely a stroke.  She reportedly has some minor difficulty with direction since her last stroke.  These aren't really episodes, however.  PLAN: 1.  MRI of brain as scheduled to look for stroke 2.  EEG to look for increased seizure tendency. 3.  I will hold off on starting an anticonvulsant.  We will see what the tests show and how she does over the next couple of weeks. 4.  Follow up in 4 weeks.  45 minutes spent with patient and husband, over 50% spent counseling and coordinating care.  Thank you for allowing me to take part in the care of  this patient.  Shon Millet, DO  CC: Darryll Capers, MD

## 2013-01-17 NOTE — Addendum Note (Signed)
Addended byEverlena Cooper, ADAM R on: 01/17/2013 10:32 AM   Modules accepted: Level of Service

## 2013-01-17 NOTE — Progress Notes (Signed)
  Subjective:    Patient ID: Theresa [name], female    DOB: 07-18-1931, 79 y.o.   MRN: [id number]  HPI This encounter was the medication only encounter   Review of Systems     Objective:   Physical Exam        Assessment & Plan:

## 2013-01-17 NOTE — Patient Instructions (Addendum)
Not clear if you had a seizure or maybe a stroke. 1. We will see what the MRI shows 2.  We will schedule an EEG to look for evidence of increased risk of seizures 3.  We will not start any seizure medication now.  We will see what the EEG and MRI shows and if you should have any other episodes over the next month.  Follow up in one month.  Your sleep deprived EEG is scheduled at [facility] on Tuesday, November 11th at 8:00 am.  Please arrive at first floor admitting fifteen minutes prior to your appointment. Stay awake from midnight until your test time. Avoid caffeine. Enter the hospital at the Entrance A off of [address].    [phone].

## 2013-01-22 ENCOUNTER — Ambulatory Visit
Admission: RE | Admit: 2013-01-22 | Discharge: 2013-01-22 | Disposition: A | Discharge status: Home / Self Care | Payer: Medicare Other | Source: Ambulatory Visit | Attending: Internal Medicine | Admitting: Internal Medicine

## 2013-01-22 DIAGNOSIS — I69922 Dysarthria following unspecified cerebrovascular disease: Secondary | ICD-10-CM

## 2013-01-22 DIAGNOSIS — R4182 Altered mental status, unspecified: Secondary | ICD-10-CM

## 2013-01-22 DIAGNOSIS — I6990 Unspecified sequelae of unspecified cerebrovascular disease: Secondary | ICD-10-CM

## 2013-01-22 DIAGNOSIS — Z8673 Personal history of transient ischemic attack (TIA), and cerebral infarction without residual deficits: Secondary | ICD-10-CM

## 2013-01-22 MED ORDER — GADOBENATE DIMEGLUMINE 529 MG/ML IV SOLN
10.0000 mL | Freq: Once | INTRAVENOUS | Status: AC | PRN
  Administered 2013-01-22: 10 mL via INTRAVENOUS

## 2013-01-24 ENCOUNTER — Telehealth: Payer: Self-pay | Admitting: Internal Medicine

## 2013-01-24 ENCOUNTER — Other Ambulatory Visit: Payer: Self-pay | Admitting: Internal Medicine

## 2013-01-24 MED ORDER — KETOROLAC TROMETHAMINE 10 MG PO TABS: 10.0000 mg | ORAL_TABLET | Freq: Three times a day (TID) | ORAL | Status: DC | PRN

## 2013-01-24 NOTE — Telephone Encounter (Signed)
Patient Information:  Caller Name: Molly Maduro  Phone: [phone]  Patient: Theresa [name], Theresa [name]  Gender: Female  DOB: Dec 07, 1931  Age: 79 Years  PCP: Darryll Capers (Adults only)  Office Follow Up:  Does the office need to follow up with this patient?: Yes  Instructions For The Office: Office please review and call husband regarding pain medication; pt is requesting medication early today because she needs to get sleep during the day due testing  RN Note:  Husband states that he doesn't want to be seen in the office; feels that this is due to the Lodine being discontinued;  requesting something for shoulder/neck pain and possibly something for sleep if Dr Lovell Sheehan feels that would not interfer with her testing for tomorrow. Requesting Rx be called in early today because she needs to sleep during the day today and then be up at midnight for the sleep deprivation EEG.  Symptoms  Reason For Call & Symptoms: Husband is calling and states that wife was in [place] and blacked out on her trip; around 01/02/13; MD is aware; Lodine was discontinued and MRI and EEG were ordered; Medrol dose pack was called in 01/17/13 for neck pain; MRI was completed on 01/22/13; last of the Medrol dose pack was completed on 01/22/13; pt has not slept now due to the neck pain; she is scheduled for the sleep deprived EEG scheduled for 11/11/4; husband is calling to request something  to help with her neck and shoulder pain to use now and after the EEG sinc the Lodine was discontinued  Reviewed Health History In EMR: Yes  Reviewed Medications In EMR: Yes  Reviewed Allergies In EMR: Yes  Reviewed Surgeries / Procedures: Yes  Date of Onset of Symptoms: 01/17/2013  Treatments Tried: Medrol dose pack  Treatments Tried Worked: No  Guideline(s) Used:  Neck Pain or Stiffness  Disposition Per Guideline:   See Within 3 Days in Office  Reason For Disposition Reached:   Moderate neck pain (e.g., interferes with normal activities like  work or school)  Advice Given:  N/A  Patient Refused Recommendation:  Patient Requests Prescription  Husband requesting something for pain to help pt sleep during the day today since she is scheduled for sleep deprivation EEG tomorrow

## 2013-01-24 NOTE — Telephone Encounter (Signed)
Per dr Lovell Sheehan- take toradol 10 tid prn may have 310

## 2013-01-25 ENCOUNTER — Ambulatory Visit (HOSPITAL_COMMUNITY)
Admission: RE | Admit: 2013-01-25 | Discharge: 2013-01-25 | Disposition: A | Discharge status: Home / Self Care | Payer: Medicare Other | Source: Ambulatory Visit | Attending: Neurology | Admitting: Neurology

## 2013-01-25 DIAGNOSIS — R404 Transient alteration of awareness: Secondary | ICD-10-CM | POA: Insufficient documentation

## 2013-01-25 DIAGNOSIS — R55 Syncope and collapse: Secondary | ICD-10-CM

## 2013-01-25 NOTE — Progress Notes (Signed)
EEG Completed; Results Pending  

## 2013-01-26 NOTE — Procedures (Signed)
ELECTROENCEPHALOGRAM REPORT  Date of Study: 01/25/2013  Patient's Name: Theresa [name] MRN: [id number] Date of Birth: August 23, 1931  Indication: Episode of loss of consciousness.  Medications: Ketorolac Levothyroxine Tramadol Metoprolol Digoxin  Technical Summary: This is a multichannel digital EEG recording, using the international 10-20 placement system.  Spike detection software was employed.  Description: The EEG background is symmetric and of low-amplitude, with a posterior dominant rhythm of 8-9 Hz, which is reactive to eye opening and closing.  Diffuse beta activity is seen, with a bilateral frontal preponderance.  Intermittent slowing in the left temporal region is seen.  No focal or generalized epileptiform discharges are seen.  Stage II sleep is briefly seen, with normal and symmetric sleep patterns.  Hyperventilation and photic stimulation were not performed.  ECG was not recorded.  Impression: This is a normal routine EEG of the awake and asleep states, without activating procedures.  Intermittent left temporal slowing is of uncertain significance in this patient's age group.  A normal study does not rule out the possibility of a seizure disorder in this patient.  Adam R. Everlena Cooper, DO

## 2013-01-28 ENCOUNTER — Telehealth: Payer: Self-pay | Admitting: Internal Medicine

## 2013-01-28 NOTE — Telephone Encounter (Signed)
Pt had mri done on 01-22-13 and EEG done on 01-25-13 with Dr Everlena Cooper. Pt would like results of both tests. Pt stated its ok to leave detail message on ans machine.

## 2013-01-28 NOTE — Telephone Encounter (Signed)
Good news no new stroke or evidence of a seizure Unclear what happened but it was not a stroke

## 2013-01-28 NOTE — Telephone Encounter (Signed)
Pt aware.

## 2013-01-28 NOTE — Telephone Encounter (Signed)
Left message for pt to call back  °

## 2013-01-31 ENCOUNTER — Encounter: Payer: Self-pay | Admitting: Internal Medicine

## 2013-02-16 ENCOUNTER — Ambulatory Visit (INDEPENDENT_AMBULATORY_CARE_PROVIDER_SITE_OTHER): Payer: Medicare Other | Admitting: Neurology

## 2013-02-16 ENCOUNTER — Encounter: Payer: Self-pay | Admitting: Neurology

## 2013-02-16 VITALS — BP 140/80 | HR 72 | Temp 97.4°F | Ht 62.0 in | Wt 106.0 lb

## 2013-02-16 DIAGNOSIS — Z8673 Personal history of transient ischemic attack (TIA), and cerebral infarction without residual deficits: Secondary | ICD-10-CM

## 2013-02-16 DIAGNOSIS — F411 Generalized anxiety disorder: Secondary | ICD-10-CM

## 2013-02-16 DIAGNOSIS — R4189 Other symptoms and signs involving cognitive functions and awareness: Secondary | ICD-10-CM

## 2013-02-16 NOTE — Progress Notes (Signed)
NEUROLOGY FOLLOW UP OFFICE NOTE  SHARMEKA PALMISANO [id number]  HISTORY OF PRESENT ILLNESS: Theresa [name] is a 79 year old right-hand woman with history of stroke, ophthalmic migraines, paroxysmal atrial fibrillation and MVP who follows up for episode of loss of awareness.  She is accompanied by her husband.  Records and images were personally reviewed where available.    In October 2014, while on a riverboat cruise in [place], she had a transient episode of loss of consciousness.  They were sitting in the lounge and she was looking at the newsletter.  She began nodding her head and she lost consciousness.  She remained in the chair, stiff and dropped the newsletter.  Her husband went to get help.  It lasted about 5 minutes and she woke up.  She was not confused when she woke up.  She did not have any convulsions, repetitive movements, urinary incontinence or tongue biting.  The next morning, she woke up and was confused.  She knew what words she wanted to say but had difficulty how to use them.  She understood her husband.  She was not amnestic to the event.  She thought she was still at home and didn't know they were already on the trip.  Once she and her husband left their cabin, she was fully oriented.  The following morning, she woke up early and was talking about taking souvenirs back home.  She fell back to sleep and later in the morning she was fine.  The EMS on the boat discussed sending her out to a hospital but felt that she was not in any danger at this point.  Since getting home, she has had difficulty performing simple daily tasks.  She has had difficulty operating the microwave, stove and the telephone.  She has also had more difficulty with direction in the car.  She currently does not drive.  She has had some cognitive difficulty since her stroke, but seems more pronounced since this last episode.  Due to residual numbness in right hand from the stroke, she has some trouble operating the TV  remote.  Since last visit, there has been some improvement in performing these tasks.  She does not have any focal weakness.  She notes more difficulty with word-finding, particularly when talking with other people, especially people she doesn't know well.  She feels self-conscious.   She has not had any more spells.  She sustained a left parietal ischemic stroke presenting as aphasia, right facial weakness and right hand numbness in December 2012, likely cardio-embolic.  She has subsequently been on anticoagulation and currently is on Xarelto.    Since the stroke, she would have periods of mild disorientation.  It is not too bad.  Usually, when she is riding with her husband, she is not sure if they should make a left or right to get to their destination.  This is something she used to be sharp about.  MRI of brain performed 01/22/13 revealed stable remote hemorrhagic infarct in posterior left frontal lobe and stable remote lacunar infarcts in cerebellum, but no acute stroke.  EEG performed 01/25/13 was normal.  PAST MEDICAL HISTORY: Past Medical History  Diagnosis Date  . Osteoarthritis   . Osteoporosis   . History of postmenopausal HRT   . MVP (mitral valve prolapse)     a. s/p MV repair at [facility] in 2008;  b. Echocardiogram 7/12: EF 50%, status post mitral valve repair with mild MR and minimal MS, mean  gradient 4, moderate LAE, LA diam 55 mm; mild RAE, PASP 28-32;   Marland Kitchen Hypothyroidism   . Hx of adenomatous colonic polyps   . Collagenous colitis   . Atrial fibrillation     AFIB  . CVA (cerebral vascular accident)     a.  L parietal CVA by MRI 12/12, likely embolic;  b.  TEE by report with neg bubble study;  c.  carotid dopplers  12/12:  + plaque, no sig ICA stenosis     MEDICATIONS: Current Outpatient Prescriptions on File Prior to Visit  Medication Sig Dispense Refill  . Ascorbic Acid (VITAMIN C) 500 MG tablet Take 500 mg by mouth daily.        Marland Kitchen atorvastatin (LIPITOR) 20 MG tablet Take  1 tablet (20 mg total) by mouth daily.  10 tablet  3  . Calcium Carbonate-Vitamin D (CALTRATE 600+D) 600-400 MG-UNIT per tablet Take 1 tablet by mouth 2 (two) times daily.        . Cholecalciferol (VITAMIN D) 2000 UNITS CAPS Take 1 capsule by mouth daily.        . clindamycin (CLEOCIN T) 1 % lotion Apply 1 application topically 2 (two) times daily. Apply to face for rosacea  60 mL  3  . digoxin (LANOXIN) 0.125 MG tablet Take 0.5 tablets (0.0625 mg total) by mouth daily.  30 tablet  3  . Esomeprazole Magnesium (NEXIUM PO) Take 1 tablet by mouth daily as needed.       . fish oil-omega-3 fatty acids 1000 MG capsule Take 1 g by mouth 2 (two) times daily.       . Folic Acid-Vit B6-Vit B12 (FOLBEE) 2.5-25-1 MG TABS tablet Take 1 tablet by mouth daily.  30 tablet  11  . glucosamine-chondroitin 500-400 MG tablet Take 1 tablet by mouth 2 (two) times daily.        Marland Kitchen ketorolac (TORADOL) 10 MG tablet Take 1 tablet (10 mg total) by mouth 3 (three) times daily as needed.  10 tablet  0  . levothyroxine (SYNTHROID, LEVOTHROID) 75 MCG tablet Take 75 mcg by mouth daily before breakfast.      . metoprolol (LOPRESSOR) 100 MG tablet Take 1 tablet (100 mg total) by mouth 2 (two) times daily.  180 tablet  3  . Multiple Vitamin (MULTIVITAMIN) tablet Take 1 tablet by mouth daily.        . Multiple Vitamins-Minerals (ICAPS AREDS FORMULA PO) Take 2 capsules by mouth daily.      . ondansetron (ZOFRAN) 8 MG tablet Take 1 tablet (8 mg total) by mouth every 8 (eight) hours as needed for nausea.  20 tablet  0  . traMADol (ULTRAM) 50 MG tablet Take 1 tablet (50 mg total) by mouth every 8 (eight) hours as needed for pain.  60 tablet  2  . vitamin A 8000 UNIT capsule Take 8,000 Units by mouth daily.      Carlena Hurl 20 MG TABS tablet TAKE ONE TABLET BY MOUTH ONCE DAILY  30 tablet  6   No current facility-administered medications on file prior to visit.    ALLERGIES: Allergies  Allergen Reactions  . Rofecoxib     REACTION:  Elevated LFT's    FAMILY HISTORY: Family History  Problem Relation Age of Onset  . Cancer Father     COLON  . Stroke Neg Hx     1ST DEGREE RELATIVE <60    SOCIAL HISTORY: History   Social History  . Marital Status: Married  Spouse Name: N/A    Number of Children: N/A  . Years of Education: N/A   Occupational History  . RETIRED    Social History Main Topics  . Smoking status: Former Smoker    Quit date: 04/28/1966  . Smokeless tobacco: Never Used  . Alcohol Use: 3.5 oz/week    7 drink(s) per week     Comment: rarely  . Drug Use: No  . Sexual Activity: Not Currently   Other Topics Concern  . Not on file   Social History Narrative  . No narrative on file    REVIEW OF SYSTEMS: Constitutional: No fevers, chills, or sweats, no generalized fatigue, change in appetite Eyes: No visual changes, double vision, eye pain Ear, nose and throat: No hearing loss, ear pain, nasal congestion, sore throat Cardiovascular: No chest pain, palpitations Respiratory:  No shortness of breath at rest or with exertion, wheezes GastrointestinaI: No nausea, vomiting, diarrhea, abdominal pain, fecal incontinence Genitourinary:  No dysuria, urinary retention or frequency Musculoskeletal:  No neck pain, back pain Integumentary: No rash, pruritus, skin lesions Neurological: as above Psychiatric: No depression, insomnia, anxiety Endocrine: No palpitations, fatigue, diaphoresis, mood swings, change in appetite, change in weight, increased thirst Hematologic/Lymphatic:  No anemia, purpura, petechiae. Allergic/Immunologic: no itchy/runny eyes, nasal congestion, recent allergic reactions, rashes  PHYSICAL EXAM: Filed Vitals:   02/16/13 0911  BP: 140/80  Pulse: 72  Temp: 97.4 F (36.3 C)   General: No acute distress Head:  Normocephalic/atraumatic Neck: supple, no paraspinal tenderness, full range of motion Heart:  Regular rate and rhythm Lungs:  Clear to auscultation bilaterally Back:  No paraspinal tenderness Neurological Exam: alert and oriented to person, place, and time. Speech fluent but occasionally hesitant and not dysarthric, able to name, write, repeat, read and follow 3 step commands across midline.  Able to spell WORLD backward.  Able to recall 3/3 words after a couple of minutes.  Able to copy intersecting pentagons correctly.  MMSE 30/30.  CN II-XII intact.  Bulk and tone normal, muscle strength 5/5 throughout.  Sensation to light touch intact.  Deep tendon reflexes 2+.  Finger to nose intact.  Gait normal, Romberg negative.  IMPRESSION: Recent episode of loss of awareness and responsiveness with some mild symptoms of apraxia.  MRI is negative but I still think she may have had a small stroke.  I don't suspect seizure at this time.  Regarding cognitive issues, it may be vascular cognitive impairment given that symptoms correlate with her stroke and symptoms have progressed since recent stroke.  However, I think stress and anxiety is also playing a role as well.  PLAN: 1.  She is already on Xarelto for secondary stroke prevention. 2.  I recommend discussing with Dr. Lovell Sheehan about appropriate treatment to address stress and anxiety.  If this is addressed, hopefully she will note improvement in cognition. 3.  Follow up in 6 months to re-evaluate.  If no improvement or worsening of cognition, then consider neuropsychological testing. 4.  Recommend no driving given her episode of unexplained loss of awareness as well as disorientation while in the car. 5.  Follow up sooner if needed.  30 minutes spent with patient and her husband, over 50% spent counseling and coordinating care.  Shon Millet, DO  CC: Darryll Capers, MD

## 2013-02-16 NOTE — Patient Instructions (Signed)
At this point, I think you may have had another stroke that wasn't present on MRI.  You are already on Xarelto and there really isn't any other changes we can make.  In terms of the memory problems, it may be related to prior stroke or may be related to anxiety.  I recommend discussing with Dr. Lovell Sheehan about addressing this anxiety and see if things improve.  I would like to see you in 6 months to re-evaluate.  If the memory is still a problem, then I recommend neuropsychological testing.  At this time, I do not recommend driving.  If there is any concern, please call and I will see you sooner if necessary.

## 2013-02-25 ENCOUNTER — Encounter: Payer: Self-pay | Admitting: *Deleted

## 2013-02-28 ENCOUNTER — Encounter: Payer: Self-pay | Admitting: Internal Medicine

## 2013-02-28 ENCOUNTER — Ambulatory Visit (INDEPENDENT_AMBULATORY_CARE_PROVIDER_SITE_OTHER): Payer: Medicare Other | Admitting: Internal Medicine

## 2013-02-28 VITALS — BP 130/70 | HR 60 | Temp 97.6°F | Resp 16 | Ht 62.0 in | Wt 108.0 lb

## 2013-02-28 DIAGNOSIS — E039 Hypothyroidism, unspecified: Secondary | ICD-10-CM

## 2013-02-28 DIAGNOSIS — I4891 Unspecified atrial fibrillation: Secondary | ICD-10-CM

## 2013-02-28 DIAGNOSIS — I059 Rheumatic mitral valve disease, unspecified: Secondary | ICD-10-CM

## 2013-02-28 DIAGNOSIS — F4323 Adjustment disorder with mixed anxiety and depressed mood: Secondary | ICD-10-CM

## 2013-02-28 MED ORDER — PRESERVISION AREDS 2 PO CAPS: 1.0000 | ORAL_CAPSULE | Freq: Every day | ORAL | Status: DC

## 2013-02-28 MED ORDER — ESCITALOPRAM OXALATE 5 MG PO TABS: 5.0000 mg | ORAL_TABLET | Freq: Every day | ORAL | Status: DC

## 2013-02-28 NOTE — Patient Instructions (Signed)
The patient is instructed to continue all medications as prescribed. Schedule followup with check out clerk upon leaving the clinic  

## 2013-02-28 NOTE — Progress Notes (Signed)
Pre visit review using our clinic review tool, if applicable. No additional management support is needed unless otherwise documented below in the visit note. 

## 2013-02-28 NOTE — Progress Notes (Signed)
Subjective:    Patient ID: Theresa [name], female    DOB: Oct 24, 1931, 79 y.o.   MRN: [id number]  HPI This is a 79 year old female is recovering from a acute stroke risk factors include atrial fibrillation and mitral valve disease.  She also has a loose stool syndrome to 3 loose stools a day I believe that irritable bowel and anxiety do play a role in this as well as intestinal flora Reviewed the neurology consultation  Review of Systems  Constitutional: Negative for activity change, appetite change and fatigue.  HENT: Negative for congestion, ear pain, postnasal drip and sinus pressure.   Eyes: Negative for redness and visual disturbance.  Respiratory: Negative for cough, shortness of breath and wheezing.   Gastrointestinal: Negative for abdominal pain and abdominal distention.  Genitourinary: Negative for dysuria, frequency and menstrual problem.  Musculoskeletal: Negative for arthralgias, joint swelling, myalgias and neck pain.  Skin: Negative for rash and wound.  Neurological: Positive for tremors, facial asymmetry, weakness and light-headedness. Negative for dizziness and headaches.       Dysarthria  Hematological: Negative for adenopathy. Does not bruise/bleed easily.  Psychiatric/Behavioral: Negative for sleep disturbance and decreased concentration.   Past Medical History  Diagnosis Date  . Osteoarthritis   . Osteoporosis   . History of postmenopausal HRT   . MVP (mitral valve prolapse)     a. s/p MV repair at [facility] in 2008;  b. Echocardiogram 7/12: EF 50%, status post mitral valve repair with mild MR and minimal MS, mean gradient 4, moderate LAE, LA diam 55 mm; mild RAE, PASP 28-32;   Marland Kitchen Hypothyroidism   . Hx of adenomatous colonic polyps   . Collagenous colitis   . Atrial fibrillation     AFIB  . CVA (cerebral vascular accident)     a.  L parietal CVA by MRI 12/12, likely embolic;  b.  TEE by report with neg bubble study;  c.  carotid dopplers  12/12:  + plaque, no sig  ICA stenosis     History   Social History  . Marital Status: Married    Spouse Name: N/A    Number of Children: N/A  . Years of Education: N/A   Occupational History  . RETIRED    Social History Main Topics  . Smoking status: Former Smoker    Quit date: 04/28/1966  . Smokeless tobacco: Never Used  . Alcohol Use: 3.5 oz/week    7 drink(s) per week     Comment: rarely  . Drug Use: No  . Sexual Activity: Not Currently   Other Topics Concern  . Not on file   Social History Narrative  . No narrative on file    Past Surgical History  Procedure Laterality Date  . Cholecystectomy    . Lumbar fusion    . Tonsillectomy    . Dilation and curettage of uterus    . Cataract extraction    . Mitral valve repair  2008  . Cardioversion  01/09/2012    Procedure: CARDIOVERSION;  Surgeon: Dolores Patty, MD;  Location: [facility] ENDOSCOPY;  Service: Cardiovascular;  Laterality: N/A;    Family History  Problem Relation Age of Onset  . Cancer Father     COLON  . Stroke Neg Hx     1ST DEGREE RELATIVE <60    Allergies  Allergen Reactions  . Rofecoxib     REACTION: Elevated LFT's    Current Outpatient Prescriptions on File Prior to Visit  Medication Sig Dispense  Refill  . Ascorbic Acid (VITAMIN C) 500 MG tablet Take 500 mg by mouth daily.        Marland Kitchen atorvastatin (LIPITOR) 20 MG tablet Take 1 tablet (20 mg total) by mouth daily.  10 tablet  3  . Calcium Carbonate-Vitamin D (CALTRATE 600+D) 600-400 MG-UNIT per tablet Take 1 tablet by mouth 2 (two) times daily.        . Cholecalciferol (VITAMIN D) 2000 UNITS CAPS Take 1 capsule by mouth daily.        . clindamycin (CLEOCIN T) 1 % lotion Apply 1 application topically 2 (two) times daily. Apply to face for rosacea  60 mL  3  . digoxin (LANOXIN) 0.125 MG tablet Take 0.5 tablets (0.0625 mg total) by mouth daily.  30 tablet  3  . Esomeprazole Magnesium (NEXIUM PO) Take 1 tablet by mouth daily as needed.       . etodolac (LODINE) 400 MG  tablet Take 400 mg by mouth 2 (two) times daily.      . Folic Acid-Vit B6-Vit B12 (FOLBEE) 2.5-25-1 MG TABS tablet Take 1 tablet by mouth daily.  30 tablet  11  . glucosamine-chondroitin 500-400 MG tablet Take 1 tablet by mouth 2 (two) times daily.        Marland Kitchen levothyroxine (SYNTHROID, LEVOTHROID) 75 MCG tablet Take 75 mcg by mouth daily before breakfast.      . metoprolol (LOPRESSOR) 100 MG tablet Take 1 tablet (100 mg total) by mouth 2 (two) times daily.  180 tablet  3  . Multiple Vitamin (MULTIVITAMIN) tablet Take 1 tablet by mouth daily.        . Multiple Vitamins-Minerals (ICAPS AREDS FORMULA PO) Take 2 capsules by mouth daily.      . vitamin A 8000 UNIT capsule Take 8,000 Units by mouth daily.      Carlena Hurl 20 MG TABS tablet TAKE ONE TABLET BY MOUTH ONCE DAILY  30 tablet  6   No current facility-administered medications on file prior to visit.    BP 130/70  Pulse 60  Temp(Src) 97.6 F (36.4 C)  Resp 16  Ht 5\' 2"  (1.575 m)  Wt 108 lb (48.988 kg)  BMI 19.75 kg/m2       Objective:   Physical Exam  Nursing note and vitals reviewed. Constitutional: She is oriented to person, place, and time. She appears well-developed and well-nourished. No distress.  HENT:  Head: Normocephalic and atraumatic.  Left Ear: External ear normal.  Eyes: Conjunctivae and EOM are normal. Pupils are equal, round, and reactive to light.  Neck: Normal range of motion. Neck supple. No JVD present. No tracheal deviation present. No thyromegaly present.  Cardiovascular: Normal rate, regular rhythm and intact distal pulses.   Murmur heard. Pulmonary/Chest: Effort normal and breath sounds normal. She has no wheezes. She exhibits no tenderness.  Abdominal: Soft. Bowel sounds are normal.  Musculoskeletal: Normal range of motion. She exhibits no edema and no tenderness.  Lymphadenopathy:    She has no cervical adenopathy.  Neurological: She is alert and oriented to person, place, and time. She has normal  reflexes. No cranial nerve deficit. Coordination abnormal.  Skin: Skin is warm and dry. She is not diaphoretic.  Psychiatric: Her behavior is normal.          Assessment & Plan:  Recovering from CVA with increased level of functioning.  Discussion of anxiety and mild depression with trial of Lexapro 5 mg by mouth daily.  Discussion of IBS and frequent  bowel syndrome with discussion of probiotics.   I have spent more than 30 minutes examining this patient face-to-face of which over half was spent in counseling

## 2013-03-21 ENCOUNTER — Emergency Department (HOSPITAL_COMMUNITY): Payer: Medicare Other

## 2013-03-21 ENCOUNTER — Inpatient Hospital Stay (HOSPITAL_COMMUNITY)
Admission: EM | Admit: 2013-03-21 | Discharge: 2013-03-22 | DRG: 069 | Disposition: A | Discharge status: Home / Self Care | Payer: Medicare Other | Attending: Internal Medicine | Admitting: Internal Medicine

## 2013-03-21 ENCOUNTER — Encounter (HOSPITAL_COMMUNITY): Payer: Self-pay | Admitting: Emergency Medicine

## 2013-03-21 ENCOUNTER — Inpatient Hospital Stay (HOSPITAL_COMMUNITY): Payer: Medicare Other

## 2013-03-21 DIAGNOSIS — Z9889 Other specified postprocedural states: Secondary | ICD-10-CM | POA: Diagnosis present

## 2013-03-21 DIAGNOSIS — M8580 Other specified disorders of bone density and structure, unspecified site: Secondary | ICD-10-CM | POA: Diagnosis present

## 2013-03-21 DIAGNOSIS — Z87891 Personal history of nicotine dependence: Secondary | ICD-10-CM

## 2013-03-21 DIAGNOSIS — E039 Hypothyroidism, unspecified: Secondary | ICD-10-CM | POA: Diagnosis present

## 2013-03-21 DIAGNOSIS — M810 Age-related osteoporosis without current pathological fracture: Secondary | ICD-10-CM | POA: Diagnosis present

## 2013-03-21 DIAGNOSIS — I4891 Unspecified atrial fibrillation: Secondary | ICD-10-CM | POA: Diagnosis present

## 2013-03-21 DIAGNOSIS — M6281 Muscle weakness (generalized): Secondary | ICD-10-CM

## 2013-03-21 DIAGNOSIS — Z79899 Other long term (current) drug therapy: Secondary | ICD-10-CM

## 2013-03-21 DIAGNOSIS — Z7901 Long term (current) use of anticoagulants: Secondary | ICD-10-CM

## 2013-03-21 DIAGNOSIS — Z8673 Personal history of transient ischemic attack (TIA), and cerebral infarction without residual deficits: Secondary | ICD-10-CM | POA: Diagnosis present

## 2013-03-21 DIAGNOSIS — I10 Essential (primary) hypertension: Secondary | ICD-10-CM | POA: Diagnosis present

## 2013-03-21 DIAGNOSIS — R197 Diarrhea, unspecified: Secondary | ICD-10-CM | POA: Diagnosis present

## 2013-03-21 DIAGNOSIS — I480 Paroxysmal atrial fibrillation: Secondary | ICD-10-CM | POA: Diagnosis present

## 2013-03-21 DIAGNOSIS — M4120 Other idiopathic scoliosis, site unspecified: Secondary | ICD-10-CM | POA: Diagnosis present

## 2013-03-21 DIAGNOSIS — G459 Transient cerebral ischemic attack, unspecified: Principal | ICD-10-CM | POA: Diagnosis present

## 2013-03-21 DIAGNOSIS — R471 Dysarthria and anarthria: Secondary | ICD-10-CM

## 2013-03-21 DIAGNOSIS — I059 Rheumatic mitral valve disease, unspecified: Secondary | ICD-10-CM | POA: Diagnosis present

## 2013-03-21 LAB — CBC WITH DIFFERENTIAL/PLATELET
BASOS ABS: 0.1 10*3/uL (ref 0.0–0.1)
Basophils Relative: 1 % (ref 0–1)
EOS PCT: 2 % (ref 0–5)
Eosinophils Absolute: 0.1 10*3/uL (ref 0.0–0.7)
HCT: 41 % (ref 36.0–46.0)
Hemoglobin: 13.5 g/dL (ref 12.0–15.0)
Lymphocytes Relative: 25 % (ref 12–46)
Lymphs Abs: 1.6 10*3/uL (ref 0.7–4.0)
MCH: 32.3 pg (ref 26.0–34.0)
MCHC: 32.9 g/dL (ref 30.0–36.0)
MCV: 98.1 fL (ref 78.0–100.0)
Monocytes Absolute: 0.6 10*3/uL (ref 0.1–1.0)
Monocytes Relative: 10 % (ref 3–12)
Neutro Abs: 3.8 10*3/uL (ref 1.7–7.7)
Neutrophils Relative %: 62 % (ref 43–77)
PLATELETS: 156 10*3/uL (ref 150–400)
RBC: 4.18 MIL/uL (ref 3.87–5.11)
RDW: 13.9 % (ref 11.5–15.5)
WBC: 6.2 10*3/uL (ref 4.0–10.5)

## 2013-03-21 LAB — TROPONIN I: Troponin I: <0.3 ng/mL (ref <0.30)

## 2013-03-21 LAB — COMPREHENSIVE METABOLIC PANEL
Albumin: 3.7 g/dL (ref 3.5–5.2)
Alkaline Phosphatase: 74 U/L (ref 39–117)
BUN: 27 mg/dL — ABNORMAL HIGH (ref 6–23)
Calcium: 9.4 mg/dL (ref 8.4–10.5)
Chloride: 103 mEq/L (ref 96–112)
GFR calc Af Amer: 76 mL/min — ABNORMAL LOW (ref >90)
Potassium: 4.3 mEq/L (ref 3.7–5.3)
Total Bilirubin: 0.7 mg/dL (ref 0.3–1.2)
Total Protein: 6.5 g/dL (ref 6.0–8.3)

## 2013-03-21 LAB — RAPID URINE DRUG SCREEN, HOSP PERFORMED
Amphetamines: NOT DETECTED
Barbiturates: NOT DETECTED
Benzodiazepines: NOT DETECTED
Cocaine: NOT DETECTED
Opiates: NOT DETECTED
Tetrahydrocannabinol: NOT DETECTED

## 2013-03-21 LAB — URINALYSIS, ROUTINE W REFLEX MICROSCOPIC
Glucose, UA: NEGATIVE mg/dL
Hgb urine dipstick: NEGATIVE
Ketones, ur: NEGATIVE mg/dL
Leukocytes, UA: NEGATIVE
Nitrite: NEGATIVE
Protein, ur: NEGATIVE mg/dL
Specific Gravity, Urine: 1.007 (ref 1.005–1.030)
Urobilinogen, UA: 0.2 mg/dL (ref 0.0–1.0)
pH: 6.5 (ref 5.0–8.0)

## 2013-03-21 LAB — POCT I-STAT, CHEM 8
BUN: 27 mg/dL — ABNORMAL HIGH (ref 6–23)
Calcium, Ion: 1.25 mmol/L (ref 1.13–1.30)
Chloride: 102 meq/L (ref 96–112)
Creatinine, Ser: 1.1 mg/dL (ref 0.50–1.10)
Glucose, Bld: 86 mg/dL (ref 70–99)
HCT: 41 % (ref 36.0–46.0)
Hemoglobin: 13.9 g/dL (ref 12.0–15.0)
Potassium: 4.2 meq/L (ref 3.7–5.3)
Sodium: 141 meq/L (ref 137–147)
TCO2: 27 mmol/L (ref 0–100)

## 2013-03-21 LAB — PROTIME-INR
INR: 1.11 (ref 0.00–1.49)
Prothrombin Time: 14.1 seconds (ref 11.6–15.2)

## 2013-03-21 LAB — COMPREHENSIVE METABOLIC PANEL WITH GFR
ALT: 20 U/L (ref 0–35)
AST: 32 U/L (ref 0–37)
CO2: 29 meq/L (ref 19–32)
Creatinine, Ser: 0.83 mg/dL (ref 0.50–1.10)
GFR calc non Af Amer: 65 mL/min — ABNORMAL LOW (ref >90)
Glucose, Bld: 88 mg/dL (ref 70–99)
Sodium: 141 meq/L (ref 137–147)

## 2013-03-21 LAB — ETHANOL: Alcohol, Ethyl (B): <11 mg/dL (ref 0–11)

## 2013-03-21 LAB — GLUCOSE, CAPILLARY: Glucose-Capillary: 78 mg/dL (ref 70–99)

## 2013-03-21 LAB — APTT: aPTT: 31 seconds (ref 24–37)

## 2013-03-21 MED ORDER — DIGOXIN 250 MCG PO TABS
0.2500 mg | ORAL_TABLET | Freq: Every day | ORAL | Status: DC
  Administered 2013-03-21: 0.25 mg via ORAL
  Filled 2013-03-21: qty 2

## 2013-03-21 MED ORDER — SACCHAROMYCES BOULARDII 250 MG PO CAPS
250.0000 mg | ORAL_CAPSULE | Freq: Two times a day (BID) | ORAL | Status: DC
  Administered 2013-03-21 – 2013-03-22 (×2): 250 mg via ORAL
  Filled 2013-03-21 (×3): qty 1

## 2013-03-21 MED ORDER — ESCITALOPRAM OXALATE 5 MG PO TABS
5.0000 mg | ORAL_TABLET | Freq: Every day | ORAL | Status: DC
  Administered 2013-03-22: 5 mg via ORAL
  Filled 2013-03-21: qty 1

## 2013-03-21 MED ORDER — LEVOTHYROXINE SODIUM 75 MCG PO TABS
75.0000 ug | ORAL_TABLET | Freq: Every day | ORAL | Status: DC
  Administered 2013-03-22: 75 ug via ORAL
  Filled 2013-03-21 (×2): qty 1

## 2013-03-21 MED ORDER — CLINDAMYCIN PHOSPHATE 1 % EX LOTN: 1.0000 "application " | TOPICAL_LOTION | Freq: Two times a day (BID) | CUTANEOUS | Status: DC

## 2013-03-21 MED ORDER — ETODOLAC 400 MG PO TABS
400.0000 mg | ORAL_TABLET | Freq: Two times a day (BID) | ORAL | Status: DC
  Administered 2013-03-21 – 2013-03-22 (×2): 400 mg via ORAL
  Filled 2013-03-21 (×3): qty 1

## 2013-03-21 MED ORDER — DIGOXIN 0.0625 MG HALF TABLET
0.0625 mg | ORAL_TABLET | Freq: Every day | ORAL | Status: DC
  Administered 2013-03-22: 0.0625 mg via ORAL
  Filled 2013-03-21: qty 1

## 2013-03-21 MED ORDER — VITAMIN C 500 MG PO TABS
500.0000 mg | ORAL_TABLET | Freq: Every day | ORAL | Status: DC
  Administered 2013-03-21: 500 mg via ORAL
  Filled 2013-03-21 (×2): qty 1

## 2013-03-21 MED ORDER — ATORVASTATIN CALCIUM 80 MG PO TABS
80.0000 mg | ORAL_TABLET | Freq: Every evening | ORAL | Status: DC
  Administered 2013-03-21: 80 mg via ORAL
  Filled 2013-03-21 (×2): qty 1

## 2013-03-21 MED ORDER — RIVAROXABAN 20 MG PO TABS
20.0000 mg | ORAL_TABLET | Freq: Every day | ORAL | Status: DC
  Administered 2013-03-21: 20 mg via ORAL
  Filled 2013-03-21 (×2): qty 1

## 2013-03-21 MED ORDER — METOPROLOL TARTRATE 100 MG PO TABS
100.0000 mg | ORAL_TABLET | Freq: Two times a day (BID) | ORAL | Status: DC
  Administered 2013-03-21: 100 mg via ORAL
  Filled 2013-03-21 (×3): qty 1

## 2013-03-21 NOTE — ED Notes (Signed)
Diet tray ordered for patient.

## 2013-03-21 NOTE — ED Notes (Signed)
To CT  Via str

## 2013-03-21 NOTE — ED Notes (Signed)
Attempted to call report x1, states they will call back

## 2013-03-21 NOTE — ED Notes (Signed)
Pt arrived from home by [facility]. Pt was doing laundry and stated to EMS that she turned while picking up laundry and suddenly had left sided weakness and slurred speech. Stated that her husband was across the hall and she had a hard time getting his attention, stated that she was trying to grip the laundry detergent with her left hand and was unable to grip. Hx of "small stroke" in 2012. Afib on monitor with hx. BP-150/90 HR-60-112. When EMS arrived pt symptoms had cleared up.

## 2013-03-21 NOTE — ED Notes (Signed)
Pt to have MRI completed then will be taken upstairs to admission bed

## 2013-03-21 NOTE — H&P (Signed)
[facility] History and Physical  Theresa [name] [id number] DOB: 1931/12/04 DOA: 03/21/2013  Referring physician: Dorna Mai PCP: [name], MD  Specialists: neurology has been consulted  Chief Complaint: TIA ?  HPI: Theresa [name] is a 80 y.o. female, known h/o MV repair 2008 @ [facility](Dr. Evelina Dun), prior CVA 02/2011 [place] followed chronically by Dr. Johnsie Cancel S./P. cardioversion 10/13-currently on systemic anticoagulation+digoxin, hypothyroidism, history of colonic polyps, collagenous colitis, osteoporosis, osteoarthritis came to [facility] ed 03/21/2013 with onset of weakness in the left upper extremity and lower extremity along with slurred speech this morning at 9 AM when she was picking up her laundry. She states that she seemed to have a little bit of pain in her back and then when she straightened up and relieved herself, the weakness started she could not hold the detergent steady in called her husband in a slurred manner. Time last normal was around 9 AM TPA not given as was on Zaroelto had rapid resolution of symptomatology  She tells me that when she was in the [place] of [place] in November, she had a similar episode overseas where she blacked out and confused but came  back to her normal self and this happened twice which is why she was referred on return to the [place] to Dr. Melvern Sample of [facility]  MRI performed 01/22/2013 showed a stable remote hemorrhagic infarct posterior left frontal lobe and stable remote like an infarct in the cerebellum but no stroke and EEG 01/25/13 was normal Her last prior stroke was noted to be December 2000 well likely cardioembolic and she presented at that time with it easier, right facial weakness and right hand numbness.  Evaluation =BUN 27, creatinine 1.1, hemoglobin 13.9, hematocrit 41 Small bilirubin tox screen negative Troponin 0.30 CT head =normal CT appearance the brain, stable atrophy and white matter  disease, remote left frontal lobe infarct EKG = rate controlled atrial fibrillation axis 0, no ST-T wave changes that are acute, no significant change from prior EKG   Review of Systems: The patient denies fever chills chest pain blurred vision double vision She has diarrhea since she started Lexapro and acidophilus which is resolving She has no dark stool or tarry stool She has no current weakness on any one side of body she's not been dizzy. She has no sick contacts  Past Medical History  Diagnosis Date  . Osteoarthritis   . Osteoporosis   . History of postmenopausal HRT   . MVP (mitral valve prolapse)     a. s/p MV repair at [facility] in 2008;  b. Echocardiogram 7/12: EF 50%, status post mitral valve repair with mild MR and minimal MS, mean gradient 4, moderate LAE, LA diam 55 mm; mild RAE, PASP 28-32;   Marland Kitchen Hypothyroidism   . Hx of adenomatous colonic polyps   . Collagenous colitis   . Atrial fibrillation     AFIB  . CVA (cerebral vascular accident)     a.  L parietal CVA by MRI [id number], likely embolic;  b.  TEE by report with neg bubble study;  c.  carotid dopplers  12/12:  + plaque, no sig ICA stenosis    Past Surgical History  Procedure Laterality Date  . Cholecystectomy    . Lumbar fusion    . Tonsillectomy    . Dilation and curettage of uterus    . Cataract extraction    . Mitral valve repair  2008  . Cardioversion  01/09/2012  Procedure: CARDIOVERSION;  Surgeon: Jolaine Artist, MD;  Location: [facility] ENDOSCOPY;  Service: Cardiovascular;  Laterality: N/A;   Social History:  History   Social History Narrative  . No narrative on file    Allergies  Allergen Reactions  . Rofecoxib     REACTION: Elevated LFT's    Family History  Problem Relation Age of Onset  . Cancer Father     COLON  . Stroke Neg Hx     1ST DEGREE RELATIVE <60     Prior to Admission medications   Medication Sig Start Date End Date Taking? Authorizing Provider  Ascorbic Acid (VITAMIN C) 500 MG  tablet Take 500 mg by mouth daily.     Yes Historical Provider, MD  atorvastatin (LIPITOR) 20 MG tablet Take 20 mg by mouth every evening.   Yes Historical Provider, MD  Calcium Carbonate-Vitamin D (CALTRATE 600+D) 600-400 MG-UNIT per tablet Take 1 tablet by mouth 2 (two) times daily.     Yes Historical Provider, MD  Cholecalciferol (VITAMIN D) 2000 UNITS CAPS Take 2,000 Units by mouth daily.    Yes Historical Provider, MD  clindamycin (CLEOCIN T) 1 % lotion Apply 1 application topically 2 (two) times daily. Apply to face for rosacea 11/08/12  Yes Ricard Dillon, MD  digoxin (LANOXIN) 0.125 MG tablet Take 0.5 tablets (0.0625 mg total) by mouth daily. 10/07/12  Yes Ricard Dillon, MD  escitalopram (LEXAPRO) 5 MG tablet Take 1 tablet (5 mg total) by mouth daily. 02/28/13  Yes Ricard Dillon, MD  etodolac (LODINE) 400 MG tablet Take 400 mg by mouth 2 (two) times daily.   Yes Historical Provider, MD  Folic Acid-Vit A3-FTD D22 (FOLBEE) 2.5-25-1 MG TABS tablet Take 1 tablet by mouth daily. 10/19/12  Yes Ricard Dillon, MD  glucosamine-chondroitin 500-400 MG tablet Take 1 tablet by mouth 2 (two) times daily.     Yes Historical Provider, MD  levothyroxine (SYNTHROID, LEVOTHROID) 75 MCG tablet Take 75 mcg by mouth daily before breakfast.   Yes Historical Provider, MD  metoprolol (LOPRESSOR) 100 MG tablet Take 1 tablet (100 mg total) by mouth 2 (two) times daily. 10/07/12  Yes Ricard Dillon, MD  Multiple Vitamin (MULTIVITAMIN) tablet Take 1 tablet by mouth every evening.    Yes Historical Provider, MD  Multiple Vitamins-Minerals (PRESERVISION AREDS 2 PO) Take 1 tablet by mouth 2 (two) times daily.   Yes Historical Provider, MD  Rivaroxaban (XARELTO) 20 MG TABS tablet Take 20 mg by mouth daily.   Yes Historical Provider, MD  saccharomyces boulardii (FLORASTOR) 250 MG capsule Take 250 mg by mouth 2 (two) times daily.   Yes Historical Provider, MD  vitamin A 8000 UNIT capsule Take 8,000 Units by mouth daily.   Yes  Historical Provider, MD   Physical Exam: Filed Vitals:   03/21/13 1034 03/21/13 1052 03/21/13 1134 03/21/13 1227  BP:  160/103 142/74 146/73  Pulse:  92 70 73  Temp:  98.3 F (36.8 C)    Resp:  19 16 20   SpO2: 98% 97% 97% 99%      General:  There is no weight on file to calculate BMI.  Eyes: EOMI NCAT, eyes move equally to direct confrontation and vision by direct confrontation is normal  ENT: soft supple, mildly elevated JVD, 3 cm  Neck: see above  Cardiovascular: S1-S2 irregularly irregular rate controlled 100 110 range  Respiratory: clinically clear no added sound  Abdomen: soft nontender nondistended no rebound no guarding, scaphoid  Skin: no lower extremity edema  Musculoskeletal: range of motion intact  Psychiatric: euthymic  Neurologic: power 5/5, DTR = 3/3, sensory intact to cold touch, range of motion intact to major muscle groups, uvula midline, smile symmetric, Babinski negative, no sensory deficit  Labs on Admission:  Basic Metabolic Panel:  Recent Labs Lab 03/21/13 1204 03/21/13 1216  NA 141 141  K 4.3 4.2  CL 103 102  CO2 29  --   GLUCOSE 88 86  BUN 27* 27*  CREATININE 0.83 1.10  CALCIUM 9.4  --    Liver Function Tests:  Recent Labs Lab 03/21/13 1204  AST 32  ALT 20  ALKPHOS 74  BILITOT 0.7  PROT 6.5  ALBUMIN 3.7   No results found for this basename: LIPASE, AMYLASE,  in the last 168 hours No results found for this basename: AMMONIA,  in the last 168 hours CBC:  Recent Labs Lab 03/21/13 1204 03/21/13 1216  WBC 6.2  --   NEUTROABS 3.8  --   HGB 13.5 13.9  HCT 41.0 41.0  MCV 98.1  --   PLT 156  --    Cardiac Enzymes:  Recent Labs Lab 03/21/13 1207  TROPONINI <0.30    BNP (last 3 results) No results found for this basename: PROBNP,  in the last 8760 hours CBG: No results found for this basename: GLUCAP,  in the last 168 hours  Radiological Exams on Admission: Ct Head Wo Contrast  03/21/2013   CLINICAL DATA:   Sudden onset of left-sided weakness and slurred speech.  EXAM: CT HEAD WITHOUT CONTRAST  TECHNIQUE: Contiguous axial images were obtained from the base of the skull through the vertex without intravenous contrast.  COMPARISON:  MRI brain 01/22/2013.  FINDINGS: No acute cortical infarct, hemorrhage, or mass lesion is present. Mild generalized atrophy and diffuse white matter disease is similar to the prior exam. A remote left frontal lobe infarct is again seen. The ventricles are of normal size. No significant extra-axial fluid collection is present.  The paranasal sinuses and mastoid air cells are clear. The osseous skull is intact.  IMPRESSION: 1. Normal CT appearance of the brain. 2. Stable atrophy and white matter disease. 3. Remote left frontal lobe infarct.   Electronically Signed   By: Lawrence Santiago M.D.   On: 03/21/2013 11:56    EKG: Independently reviewed. See above  Assessment/Plan Principal Problem:   TIA (transient ischemic attack)-neurology already has seen the patient.  Patient will be discussed and rounding stroke M.D. Will confer with cardiology as to best type of anticoagulation as she has now had 2 episodes of while on systemic anticoagulation for atrial fibrillation.  I will defer this decision to them.  I suspect she may need a TEE given moderate 2 severe enlargement of the left atrium-once again rounding stroke M.D. To make this decision We will get HbA1c, fasting lipid panel, MRI brain, echocardiogram, carotid Dopplers She is back at her baseline and actually walks 2 miles every day and is highly functional other than for driving as she's been told by her neurologist not to do this I suspect she will not need the help of physical therapy Active Problems:   HYPOTHYROIDISM-TSH in 4-6 weeks as is on replacement   Mitral valve disorders-followup outpatient with cardiology-echo pending however I am not sure of her symptoms could be related to her mitral valve repair--her echocardiogram  11/26/11 showed mild residualstenosis, moderate regurgitation and echo should be able to tell us if there is any  deficit or defect with regards to that. It is unlikely however.     Atrial fibrillation-Chad2vasc=7-this correlates with the 21% of event without anticoagulation.  She currently systemically anticoagulated. Because her A. Fib is in the 110-120 range, I discussed with neurologist Dr. Aram Beecham regarding rate controlling agents-he prefers that we do not give anything to drop her blood pressure below 160 over 94 right now and therefore I will give her a one-time dose of digoxin 0.125 stat.  If her heart rate does not come under better control one could trial repeating this vs. Starting amiodarone as a load and then gradually down titrate over the next 2 weeks however I would defer to them this decision to cardiology who will need to be consulted at that time   OSTEOPOROSIS-continue replacement as an outpatient   SCOLIOSIS, LUMBAR SPINE-monitor closely.   Diarrhea-likely iatrogenic and related to multiple medications as reported to me by her.   Hypertension-aim to keep blood pressure above 160/100-metoprolol held, see above discussion regarding digoxin loading in the emergency room   Time spent: [place], [facility] Pager [phone] If 7PM-7AM, please contact night-coverage [email or web address] Password [email or web address] 03/21/2013, 2:49 PM

## 2013-03-21 NOTE — ED Provider Notes (Signed)
I saw and evaluated the patient, reviewed the resident's note and I agree with the findings and plan.  EKG Interpretation   At time 10:49 shows atrial fib at rate 75, RSR` in lead V2, seen previously, non specific ST changes, abn ECG.       Pt with a h/o stroke in [place], [place] in 2012, seen several months ago for TIA, had garbled speech and confusions lasting 5 minutes at home this AM.  Will need TIA work up again, discuss with Neurohospitalist.  Pt is on xarelto for atrial fib.  BP is marginally high, but not critical.  Currently, speech is at baseline per spouse, no arm drift, no focal weakness.  No facial droop.  Plan to obtain head CT, discuss with neurohospitalist and consider admission for further TIA assessment.       Saddie Benders. Minha Fulco, MD 03/21/13 [phone]

## 2013-03-21 NOTE — ED Notes (Signed)
Pt states that she bent to put a  Load laundry in and came up and could not speak or use her left side tried to get her husbands attention and by the time she got his attention and they called 911 all had gotten better pt has passed hx of stroke in past

## 2013-03-21 NOTE — Consult Note (Signed)
Referring Physician: Ghim    Chief Complaint: transient left arm and leg weakness with dysarthria  HPI:                                                                                                                                         Theresa [name] is an 80 y.o. female who was doing laundry this morning when she bent down and noted her left arm was weak. She was unable to pick her left foot up from the carpet and when she tried to call her husband she was unable to get her words out.  This last lasted for about seconds to a few minutes.  This has fully resolved. She was recently seen by Dr. Tomi Likens for possible TIA. Placed on Xeralto for Afib.   At this time she is back to baseline.   Date last known well: Date: 03/21/2012 Time last known well: Unable to determine tPA Given: No: on Xeralto and resolution of symptoms  Past Medical History  Diagnosis Date  . Osteoarthritis   . Osteoporosis   . History of postmenopausal HRT   . MVP (mitral valve prolapse)     a. s/p MV repair at [facility] in 2008;  b. Echocardiogram 7/12: EF 50%, status post mitral valve repair with mild MR and minimal MS, mean gradient 4, moderate LAE, LA diam 55 mm; mild RAE, PASP 28-32;   Marland Kitchen Hypothyroidism   . Hx of adenomatous colonic polyps   . Collagenous colitis   . Atrial fibrillation     AFIB  . CVA (cerebral vascular accident)     a.  L parietal CVA by MRI 92/42, likely embolic;  b.  TEE by report with neg bubble study;  c.  carotid dopplers  12/12:  + plaque, no sig ICA stenosis     Past Surgical History  Procedure Laterality Date  . Cholecystectomy    . Lumbar fusion    . Tonsillectomy    . Dilation and curettage of uterus    . Cataract extraction    . Mitral valve repair  2008  . Cardioversion  01/09/2012    Procedure: CARDIOVERSION;  Surgeon: Jolaine Artist, MD;  Location: [facility] ENDOSCOPY;  Service: Cardiovascular;  Laterality: N/A;    Family History  Problem Relation Age of Onset  . Cancer Father      COLON  . Stroke Neg Hx     1ST DEGREE RELATIVE <60   Social History:  reports that she quit smoking about 46 years ago. She has never used smokeless tobacco. She reports that she drinks about 3.5 ounces of alcohol per week. She reports that she does not use illicit drugs.  Allergies:  Allergies  Allergen Reactions  . Rofecoxib     REACTION: Elevated LFT's    Medications:  No current facility-administered medications for this encounter.   Current Outpatient Prescriptions  Medication Sig Dispense Refill  . Ascorbic Acid (VITAMIN C) 500 MG tablet Take 500 mg by mouth daily.        Marland Kitchen atorvastatin (LIPITOR) 20 MG tablet Take 20 mg by mouth every evening.      . Calcium Carbonate-Vitamin D (CALTRATE 600+D) 600-400 MG-UNIT per tablet Take 1 tablet by mouth 2 (two) times daily.        . Cholecalciferol (VITAMIN D) 2000 UNITS CAPS Take 2,000 Units by mouth daily.       . clindamycin (CLEOCIN T) 1 % lotion Apply 1 application topically 2 (two) times daily. Apply to face for rosacea  60 mL  3  . digoxin (LANOXIN) 0.125 MG tablet Take 0.5 tablets (0.0625 mg total) by mouth daily.  30 tablet  3  . escitalopram (LEXAPRO) 5 MG tablet Take 1 tablet (5 mg total) by mouth daily.  30 tablet  3  . etodolac (LODINE) 400 MG tablet Take 400 mg by mouth 2 (two) times daily.      . Folic Acid-Vit I6-OEH O12 (FOLBEE) 2.5-25-1 MG TABS tablet Take 1 tablet by mouth daily.  30 tablet  11  . glucosamine-chondroitin 500-400 MG tablet Take 1 tablet by mouth 2 (two) times daily.        Marland Kitchen levothyroxine (SYNTHROID, LEVOTHROID) 75 MCG tablet Take 75 mcg by mouth daily before breakfast.      . metoprolol (LOPRESSOR) 100 MG tablet Take 1 tablet (100 mg total) by mouth 2 (two) times daily.  180 tablet  3  . Multiple Vitamin (MULTIVITAMIN) tablet Take 1 tablet by mouth every evening.       . Multiple  Vitamins-Minerals (PRESERVISION AREDS 2 PO) Take 1 tablet by mouth 2 (two) times daily.      . Rivaroxaban (XARELTO) 20 MG TABS tablet Take 20 mg by mouth daily.      Marland Kitchen saccharomyces boulardii (FLORASTOR) 250 MG capsule Take 250 mg by mouth 2 (two) times daily.      . vitamin A 8000 UNIT capsule Take 8,000 Units by mouth daily.         ROS:                                                                                                                                       History obtained from the patient  General ROS: negative for - chills, fatigue, fever, night sweats, weight gain or weight loss Psychological ROS: negative for - behavioral disorder, hallucinations, memory difficulties, mood swings or suicidal ideation Ophthalmic ROS: negative for - blurry vision, double vision, eye pain or loss of vision ENT ROS: negative for - epistaxis, nasal discharge, oral lesions, sore throat, tinnitus or vertigo Allergy and Immunology ROS: negative for - hives or itchy/watery eyes Hematological and Lymphatic ROS: negative for - bleeding problems, bruising or swollen  lymph nodes Endocrine ROS: negative for - galactorrhea, hair pattern changes, polydipsia/polyuria or temperature intolerance Respiratory ROS: negative for - cough, hemoptysis, shortness of breath or wheezing Cardiovascular ROS: negative for - chest pain, dyspnea on exertion, edema or irregular heartbeat Gastrointestinal ROS: negative for - abdominal pain, diarrhea, hematemesis, nausea/vomiting or stool incontinence Genito-Urinary ROS: negative for - dysuria, hematuria, incontinence or urinary frequency/urgency Musculoskeletal ROS: negative for - joint swelling or muscular weakness Neurological ROS: as noted in HPI Dermatological ROS: negative for rash and skin lesion changes  Neurologic Examination:                                                                                                      Blood pressure 146/73, pulse 73,  temperature 98.3 F (36.8 C), resp. rate 20, SpO2 99.00%.  Mental Status: Alert, oriented, thought content appropriate.  Speech hesitant without evidence of aphasia.  Able to follow 3 step commands without difficulty. Cranial Nerves: II: Discs flat bilaterally; Visual fields showed possible right upper quadrantopia, pupils anisocoric with left  41m larger than right, round, reactive to light and accommodation III,IV, VI: ptosis not present, extra-ocular motions intact bilaterally V,VII: smile symmetric, facial light touch sensation normal bilaterally VIII: hearing normal bilaterally IX,X: gag reflex present XI: bilateral shoulder shrug XII: midline tongue extension without atrophy or fasciculations  Motor: Right : Upper extremity   5/5    Left:     Upper extremity   5/5  Lower extremity   5/5     Lower extremity   5/5 Tone and bulk:normal tone throughout; no atrophy noted Sensory: Pinprick and light touch intact throughout, bilaterally Deep Tendon Reflexes:  Right: Upper Extremity   Left: Upper extremity   biceps (C-5 to C-6) 1/4   biceps (C-5 to C-6) 1/4 tricep (C7) 1/4    triceps (C7) 1/4 Brachioradialis (C6) 1/4  Brachioradialis (C6) 1/4  Lower Extremity Lower Extremity  quadriceps (L-2 to L-4) 2/4   quadriceps (L-2 to L-4) 2/4 Achilles (S1) 2/4   Achilles (S1) 2/4  Plantars: Right: downgoing   Left: downgoing Cerebellar: normal finger-to-nose,  normal heel-to-shin test Gait: not assessed due to multiple leads CV: pulses palpable throughout    Results for orders placed during the hospital encounter of 03/21/13 (from the past 48 hour(s))  CBC WITH DIFFERENTIAL     Status: None   Collection Time    03/21/13 12:04 PM      Result Value Range   WBC 6.2  4.0 - 10.5 K/uL   RBC 4.18  3.87 - 5.11 MIL/uL   Hemoglobin 13.5  12.0 - 15.0 g/dL   HCT 41.0  36.0 - 46.0 %   MCV 98.1  78.0 - 100.0 fL   MCH 32.3  26.0 - 34.0 pg   MCHC 32.9  30.0 - 36.0 g/dL   RDW 13.9  11.5 - 15.5 %    Platelets 156  150 - 400 K/uL   Neutrophils Relative % 62  43 - 77 %   Neutro Abs 3.8  1.7 - 7.7 K/uL   Lymphocytes Relative  25  12 - 46 %   Lymphs Abs 1.6  0.7 - 4.0 K/uL   Monocytes Relative 10  3 - 12 %   Monocytes Absolute 0.6  0.1 - 1.0 K/uL   Eosinophils Relative 2  0 - 5 %   Eosinophils Absolute 0.1  0.0 - 0.7 K/uL   Basophils Relative 1  0 - 1 %   Basophils Absolute 0.1  0.0 - 0.1 K/uL  ETHANOL     Status: None   Collection Time    03/21/13 12:04 PM      Result Value Range   Alcohol, Ethyl (B) <11  0 - 11 mg/dL   Comment:            LOWEST DETECTABLE LIMIT FOR     SERUM ALCOHOL IS 11 mg/dL     FOR MEDICAL PURPOSES ONLY  PROTIME-INR     Status: None   Collection Time    03/21/13 12:04 PM      Result Value Range   Prothrombin Time 14.1  11.6 - 15.2 seconds   INR 1.11  0.00 - 1.49  APTT     Status: None   Collection Time    03/21/13 12:04 PM      Result Value Range   aPTT 31  24 - 37 seconds  COMPREHENSIVE METABOLIC PANEL     Status: Abnormal   Collection Time    03/21/13 12:04 PM      Result Value Range   Sodium 141  137 - 147 mEq/L   Potassium 4.3  3.7 - 5.3 mEq/L   Chloride 103  96 - 112 mEq/L   CO2 29  19 - 32 mEq/L   Glucose, Bld 88  70 - 99 mg/dL   BUN 27 (*) 6 - 23 mg/dL   Creatinine, Ser 0.83  0.50 - 1.10 mg/dL   Calcium 9.4  8.4 - 10.5 mg/dL   Total Protein 6.5  6.0 - 8.3 g/dL   Albumin 3.7  3.5 - 5.2 g/dL   AST 32  0 - 37 U/L   ALT 20  0 - 35 U/L   Alkaline Phosphatase 74  39 - 117 U/L   Total Bilirubin 0.7  0.3 - 1.2 mg/dL   GFR calc non Af Amer 65 (*) >90 mL/min   GFR calc Af Amer 76 (*) >90 mL/min   Comment: (NOTE)     The eGFR has been calculated using the CKD EPI equation.     This calculation has not been validated in all clinical situations.     eGFR's persistently <90 mL/min signify possible Chronic Kidney     Disease.  TROPONIN I     Status: None   Collection Time    03/21/13 12:07 PM      Result Value Range   Troponin I <0.30   <0.30 ng/mL   Comment:            Due to the release kinetics of cTnI,     a negative result within the first hours     of the onset of symptoms does not rule out     myocardial infarction with certainty.     If myocardial infarction is still suspected,     repeat the test at appropriate intervals.  POCT I-STAT, CHEM 8     Status: Abnormal   Collection Time    03/21/13 12:16 PM      Result Value Range   Sodium 141  137 -  147 mEq/L   Potassium 4.2  3.7 - 5.3 mEq/L   Chloride 102  96 - 112 mEq/L   BUN 27 (*) 6 - 23 mg/dL   Creatinine, Ser 1.10  0.50 - 1.10 mg/dL   Glucose, Bld 86  70 - 99 mg/dL   Calcium, Ion 1.25  1.13 - 1.30 mmol/L   TCO2 27  0 - 100 mmol/L   Hemoglobin 13.9  12.0 - 15.0 g/dL   HCT 41.0  36.0 - 46.0 %  URINE RAPID DRUG SCREEN (HOSP PERFORMED)     Status: None   Collection Time    03/21/13 12:42 PM      Result Value Range   Opiates NONE DETECTED  NONE DETECTED   Cocaine NONE DETECTED  NONE DETECTED   Benzodiazepines NONE DETECTED  NONE DETECTED   Amphetamines NONE DETECTED  NONE DETECTED   Tetrahydrocannabinol NONE DETECTED  NONE DETECTED   Barbiturates NONE DETECTED  NONE DETECTED   Comment:            DRUG SCREEN FOR MEDICAL PURPOSES     ONLY.  IF CONFIRMATION IS NEEDED     FOR ANY PURPOSE, NOTIFY LAB     WITHIN 5 DAYS.                LOWEST DETECTABLE LIMITS     FOR URINE DRUG SCREEN     Drug Class       Cutoff (ng/mL)     Amphetamine      1000     Barbiturate      200     Benzodiazepine   606     Tricyclics       301     Opiates          300     Cocaine          300     THC              50  URINALYSIS, ROUTINE W REFLEX MICROSCOPIC     Status: Abnormal   Collection Time    03/21/13 12:42 PM      Result Value Range   Color, Urine YELLOW  YELLOW   APPearance CLEAR  CLEAR   Specific Gravity, Urine 1.007  1.005 - 1.030   pH 6.5  5.0 - 8.0   Glucose, UA NEGATIVE  NEGATIVE mg/dL   Hgb urine dipstick NEGATIVE  NEGATIVE   Bilirubin Urine SMALL (*)  NEGATIVE   Ketones, ur NEGATIVE  NEGATIVE mg/dL   Protein, ur NEGATIVE  NEGATIVE mg/dL   Urobilinogen, UA 0.2  0.0 - 1.0 mg/dL   Nitrite NEGATIVE  NEGATIVE   Leukocytes, UA NEGATIVE  NEGATIVE   Comment: MICROSCOPIC NOT DONE ON URINES WITH NEGATIVE PROTEIN, BLOOD, LEUKOCYTES, NITRITE, OR GLUCOSE <1000 mg/dL.   Ct Head Wo Contrast  03/21/2013   CLINICAL DATA:  Sudden onset of left-sided weakness and slurred speech.  EXAM: CT HEAD WITHOUT CONTRAST  TECHNIQUE: Contiguous axial images were obtained from the base of the skull through the vertex without intravenous contrast.  COMPARISON:  MRI brain 01/22/2013.  FINDINGS: No acute cortical infarct, hemorrhage, or mass lesion is present. Mild generalized atrophy and diffuse white matter disease is similar to the prior exam. A remote left frontal lobe infarct is again seen. The ventricles are of normal size. No significant extra-axial fluid collection is present.  The paranasal sinuses and mastoid air cells are clear. The osseous skull is intact.  IMPRESSION:  1. Normal CT appearance of the brain. 2. Stable atrophy and white matter disease. 3. Remote left frontal lobe infarct.   Electronically Signed   By: Lawrence Santiago M.D.   On: 03/21/2013 11:56    Assessment and plan discussed with with attending physician and they are in agreement.    Etta Quill PA-C [facility] [phone]  03/21/2013, 2:02 PM   Assessment: 80 y.o. female presenting with transient left arm and leg weakness in the setting of Afib on Xeralto. Likely TIA given risk factors and active Afib.   Stroke Risk Factors - atrial fibrillation   Recommend: 1. HgbA1c, fasting lipid panel 2. MRI brain only 3. PT consult, OT consult, Speech consult 4. Echocardiogram 5. Carotid dopplers 6. Prophylactic therapy-continue Xeralto 7. Risk  Factor modification  Patient seen and examined together with physician assistant and I concur with the assessment and plan.  Dorian Pod,  MD

## 2013-03-21 NOTE — ED Notes (Signed)
Pt given blanket and  Neuro here to see pt

## 2013-03-21 NOTE — ED Provider Notes (Signed)
CSN: [id number]     Arrival date & time 03/21/13  1033 History   First MD Initiated Contact with Patient 03/21/13 1044     Chief Complaint  Patient presents with  . Transient Ischemic Attack   Patient is a 80 y.o. female presenting with Acute Neurological Problem.  Cerebrovascular Accident This is a new problem. The current episode started today. Episode frequency: once. The problem has been resolved. Associated symptoms include weakness. Pertinent negatives include no abdominal pain, chest pain, chills, fever, headaches, nausea, numbness or vomiting. Nothing aggravates the symptoms. She has tried acetaminophen for the symptoms.   Patient was doing laundry when she had sudden onset of left sided weakness and slurred speech. Symptoms resolved within 10 minutes. Now asymptomatic. Has been in her usual state of health last several days. Was doing laundry at symptoms onset.    Past Medical History  Diagnosis Date  . Osteoarthritis   . Osteoporosis   . History of postmenopausal HRT   . MVP (mitral valve prolapse)     a. s/p MV repair at [facility] in 2008;  b. Echocardiogram 7/12: EF 50%, status post mitral valve repair with mild MR and minimal MS, mean gradient 4, moderate LAE, LA diam 55 mm; mild RAE, PASP 28-32;   Marland Kitchen Hypothyroidism   . Hx of adenomatous colonic polyps   . Collagenous colitis   . Atrial fibrillation     AFIB  . CVA (cerebral vascular accident)     a.  L parietal CVA by MRI 10/17, likely embolic;  b.  TEE by report with neg bubble study;  c.  carotid dopplers  12/12:  + plaque, no sig ICA stenosis    Past Surgical History  Procedure Laterality Date  . Cholecystectomy    . Lumbar fusion    . Tonsillectomy    . Dilation and curettage of uterus    . Cataract extraction    . Mitral valve repair  2008  . Cardioversion  01/09/2012    Procedure: CARDIOVERSION;  Surgeon: Jolaine Artist, MD;  Location: [facility] ENDOSCOPY;  Service: Cardiovascular;  Laterality: N/A;   Family History   Problem Relation Age of Onset  . Cancer Father     COLON  . Stroke Neg Hx     1ST DEGREE RELATIVE <60   History  Substance Use Topics  . Smoking status: Former Smoker    Quit date: 04/28/1966  . Smokeless tobacco: Never Used  . Alcohol Use: 3.5 oz/week    7 drink(s) per week     Comment: rarely   OB History   Grav Para Term Preterm Abortions TAB SAB Ect Mult Living                 Review of Systems  Constitutional: Negative for fever and chills.  Respiratory: Negative for shortness of breath.   Cardiovascular: Negative for chest pain.  Gastrointestinal: Negative for nausea, vomiting and abdominal pain.  Neurological: Positive for weakness. Negative for numbness and headaches.  All other systems reviewed and are negative.   Allergies  Rofecoxib  Home Medications   Current Outpatient Rx  Name  Route  Sig  Dispense  Refill  . Ascorbic Acid (VITAMIN C) 500 MG tablet   Oral   Take 500 mg by mouth daily.           Marland Kitchen atorvastatin (LIPITOR) 20 MG tablet   Oral   Take 20 mg by mouth every evening.         Marland Kitchen  Calcium Carbonate-Vitamin D (CALTRATE 600+D) 600-400 MG-UNIT per tablet   Oral   Take 1 tablet by mouth 2 (two) times daily.           . Cholecalciferol (VITAMIN D) 2000 UNITS CAPS   Oral   Take 2,000 Units by mouth daily.          . clindamycin (CLEOCIN T) 1 % lotion   Topical   Apply 1 application topically 2 (two) times daily. Apply to face for rosacea   60 mL   3   . digoxin (LANOXIN) 0.125 MG tablet   Oral   Take 0.5 tablets (0.0625 mg total) by mouth daily.   30 tablet   3   . escitalopram (LEXAPRO) 5 MG tablet   Oral   Take 1 tablet (5 mg total) by mouth daily.   30 tablet   3   . etodolac (LODINE) 400 MG tablet   Oral   Take 400 mg by mouth 2 (two) times daily.         . Folic Acid-Vit Q000111Q 123456 (FOLBEE) 2.5-25-1 MG TABS tablet   Oral   Take 1 tablet by mouth daily.   30 tablet   11   . glucosamine-chondroitin 500-400 MG  tablet   Oral   Take 1 tablet by mouth 2 (two) times daily.           Marland Kitchen levothyroxine (SYNTHROID, LEVOTHROID) 75 MCG tablet   Oral   Take 75 mcg by mouth daily before breakfast.         . metoprolol (LOPRESSOR) 100 MG tablet   Oral   Take 1 tablet (100 mg total) by mouth 2 (two) times daily.   180 tablet   3   . Multiple Vitamin (MULTIVITAMIN) tablet   Oral   Take 1 tablet by mouth every evening.          . Multiple Vitamins-Minerals (PRESERVISION AREDS 2 PO)   Oral   Take 1 tablet by mouth 2 (two) times daily.         . Rivaroxaban (XARELTO) 20 MG TABS tablet   Oral   Take 20 mg by mouth daily.         Marland Kitchen saccharomyces boulardii (FLORASTOR) 250 MG capsule   Oral   Take 250 mg by mouth 2 (two) times daily.         . vitamin A 8000 UNIT capsule   Oral   Take 8,000 Units by mouth daily.          BP 142/75  Pulse 85  Temp(Src) 98.3 F (36.8 C)  Resp 17  SpO2 97% Physical Exam  Nursing note and vitals reviewed. Constitutional: She is oriented to person, place, and time. She appears well-developed and well-nourished. No distress.  HENT:  Head: Normocephalic and atraumatic.  Eyes: Conjunctivae are normal. Pupils are equal, round, and reactive to light.  Neck: Normal range of motion. Neck supple.  Cardiovascular: Normal rate and regular rhythm.  Exam reveals no gallop and no friction rub.   No murmur heard. Pulmonary/Chest: Effort normal and breath sounds normal.  Abdominal: Soft. She exhibits no distension. There is no tenderness.  Musculoskeletal: Normal range of motion. She exhibits no edema and no tenderness.  Neurological: She is alert and oriented to person, place, and time. She has normal strength and normal reflexes. No cranial nerve deficit or sensory deficit.  Skin: Skin is warm and dry.  Psychiatric: She has a normal mood and affect.  ED Course  Procedures (including critical care time) Labs Review Labs Reviewed  COMPREHENSIVE METABOLIC  PANEL - Abnormal; Notable for the following:    BUN 27 (*)    GFR calc non Af Amer 65 (*)    GFR calc Af Amer 76 (*)    All other components within normal limits  URINALYSIS, ROUTINE W REFLEX MICROSCOPIC - Abnormal; Notable for the following:    Bilirubin Urine SMALL (*)    All other components within normal limits  POCT I-STAT, CHEM 8 - Abnormal; Notable for the following:    BUN 27 (*)    All other components within normal limits  CBC WITH DIFFERENTIAL  ETHANOL  PROTIME-INR  APTT  TROPONIN I  URINE RAPID DRUG SCREEN (HOSP PERFORMED)   Imaging Review Ct Head Wo Contrast  03/21/2013   CLINICAL DATA:  Sudden onset of left-sided weakness and slurred speech.  EXAM: CT HEAD WITHOUT CONTRAST  TECHNIQUE: Contiguous axial images were obtained from the base of the skull through the vertex without intravenous contrast.  COMPARISON:  MRI brain 01/22/2013.  FINDINGS: No acute cortical infarct, hemorrhage, or mass lesion is present. Mild generalized atrophy and diffuse white matter disease is similar to the prior exam. A remote left frontal lobe infarct is again seen. The ventricles are of normal size. No significant extra-axial fluid collection is present.  The paranasal sinuses and mastoid air cells are clear. The osseous skull is intact.  IMPRESSION: 1. Normal CT appearance of the brain. 2. Stable atrophy and white matter disease. 3. Remote left frontal lobe infarct.   Electronically Signed   By: Lawrence Santiago M.D.   On: 03/21/2013 11:56    EKG Interpretation   None      MDM   Patient was doing laundry when she had sudden onset of left sided weakness and slurred speech. Symptoms now resolved. Non-focal neurological exam. VSS. Well appearing. CT head normal. Labs unremarkable. Likely TIA. Neuro consulted and recommended MRI and further TIA workup. The patient was admitted to hospitalist in HDS condition.   1. TIA (transient ischemic attack)         Donita Brooks, MD 03/21/13 1529

## 2013-03-22 DIAGNOSIS — I369 Nonrheumatic tricuspid valve disorder, unspecified: Secondary | ICD-10-CM

## 2013-03-22 DIAGNOSIS — I4891 Unspecified atrial fibrillation: Secondary | ICD-10-CM

## 2013-03-22 LAB — HEMOGLOBIN A1C
Hgb A1c MFr Bld: 5.5 % (ref <5.7)
Mean Plasma Glucose: 111 mg/dL (ref <117)

## 2013-03-22 LAB — GLUCOSE, CAPILLARY: GLUCOSE-CAPILLARY: 99 mg/dL (ref 70–99)

## 2013-03-22 LAB — LIPID PANEL
Cholesterol: 119 mg/dL (ref 0–200)
HDL: 79 mg/dL (ref >39)
LDL CALC: 31 mg/dL (ref 0–99)
Total CHOL/HDL Ratio: 1.5 RATIO
Triglycerides: 44 mg/dL (ref <150)
VLDL: 9 mg/dL (ref 0–40)

## 2013-03-22 MED ORDER — RIVAROXABAN 20 MG PO TABS: 20.0000 mg | ORAL_TABLET | Freq: Every day | ORAL | Status: DC

## 2013-03-22 MED ORDER — METOPROLOL TARTRATE 50 MG PO TABS
50.0000 mg | ORAL_TABLET | Freq: Two times a day (BID) | ORAL | Status: DC
  Administered 2013-03-22: 50 mg via ORAL
  Filled 2013-03-22: qty 1

## 2013-03-22 NOTE — Discharge Summary (Signed)
Physician Discharge Summary  Theresa [name] [id number] DOB: 1931-11-11 DOA: 03/21/2013  PCP: Theresa Haber, MD  Admit date: 03/21/2013 Discharge date: 03/22/2013  Time spent: 40 minutes  Recommendations for Outpatient Follow-up:  Home with outpt PCP follow up  Discharge Diagnoses:  Principal Problem:   TIA (transient ischemic attack)  Active Problems:   HYPOTHYROIDISM   Mitral valve disorders   Atrial fibrillation   OSTEOPOROSIS   SCOLIOSIS, LUMBAR SPINE   Hypertension   Discharge Condition: fair  Diet recommendation: cardiac  Filed Weights   03/21/13 1946  Weight: 47 kg (103 lb 9.9 oz)    History of present illness:  Please refer to admission H&P for details, but in brief, 80 y.o. female, known h/o MV repair 2008 @ [facility](Dr. Evelina Dun), prior CVA 02/2011 [facility], Paroxysmal  A. fib followed chronically by Dr. Johnsie Cancel S./P. cardioversion 10/13-currently on systemic anticoagulation+digoxin, hypothyroidism, history of colonic polyps, collagenous colitis, osteoporosis, osteoarthritis came to [facility] ED on 03/21/2013 with onset of weakness in the left upper extremity and lower extremity along with slurred speech this morning at 9 AM when she was picking up her laundry. She states that she seemed to have a little bit of pain in her back and then when she straightened up and relieved herself, the weakness started she could not hold the detergent steady in called her husband in a slurred manner.  Time last normal was around 9 AM  TPA not given as was on Zaroelto had rapid resolution of symptomatology.   Hospital Course:  TIA Admitted to telemetry  head CT and MRI brain on admission unremarkable. 2D echo with normal EF. Carotid doppler with non significant stenosis. A1C and lipid panel wnl. Symptoms resolved. Patient reports taking xarelto in am. instructed her to take with supper for better absorption. Appreciate neuro recs. Continue statin   Afib Rate elevated on  admission. Resumed digoxin. Lowered metoprolol for permissive HTN. Rate stable today. Will resume home dose.  Hypothyroidism TSH normal. continue synthroid      Procedures: MRI BRAIN, 2d echo  Consultations:  neurology  Discharge Exam: Filed Vitals:   03/22/13 0812  BP: 151/76  Pulse: 72  Temp: 97.9 F (36.6 C)  Resp: 16    General: elderly female in NAD HEENT: no pallor, moist oral mucosa Chest: clear b/l,m no added sounds CVS: S1&S2 Irregular, no added sounds Abd: soft, NT, ND BS+ Ext: warm, no edema CNS: AAOX3,   Discharge Instructions   Future Appointments Provider Department Dept Phone   05/10/2013 10:00 AM Josue Hector, MD [place] [phone]   07/01/2013 4:00 PM Ricard Dillon, MD [place] [facility] [place]   08/17/2013 10:30 AM Adam Melvern Sample, DO [facility] Neurology [facility] [phone]       Medication List         atorvastatin 20 MG tablet  Commonly known as:  LIPITOR  Take 20 mg by mouth every evening.     CALTRATE 600+D 600-400 MG-UNIT per tablet  Generic drug:  Calcium Carbonate-Vitamin D  Take 1 tablet by mouth 2 (two) times daily.     clindamycin 1 % lotion  Commonly known as:  CLEOCIN T  Apply 1 application topically 2 (two) times daily. Apply to face for rosacea     digoxin 0.125 MG tablet  Commonly known as:  LANOXIN  Take 0.5 tablets (0.0625 mg total) by mouth daily.     escitalopram 5 MG tablet  Commonly known as:  LEXAPRO  Take 1 tablet (5 mg total) by mouth daily.     etodolac 400 MG tablet  Commonly known as:  LODINE  Take 400 mg by mouth 2 (two) times daily.     Folic Acid-Vit Q000111Q 123456 2.5-25-1 MG Tabs tablet  Commonly known as:  FOLBEE  Take 1 tablet by mouth daily.     glucosamine-chondroitin 500-400 MG tablet  Take 1 tablet by mouth 2 (two) times daily.     levothyroxine 75 MCG tablet  Commonly known as:  SYNTHROID, LEVOTHROID  Take 75 mcg by mouth daily before  breakfast.     metoprolol 100 MG tablet  Commonly known as:  LOPRESSOR  Take 1 tablet (100 mg total) by mouth 2 (two) times daily.     multivitamin tablet  Take 1 tablet by mouth every evening.     PRESERVISION AREDS 2 PO  Take 1 tablet by mouth 2 (two) times daily.     Rivaroxaban 20 MG Tabs tablet  Commonly known as:  XARELTO  Take 1 tablet (20 mg total) by mouth daily with supper.     saccharomyces boulardii 250 MG capsule  Commonly known as:  FLORASTOR  Take 250 mg by mouth 2 (two) times daily.     vitamin A 8000 UNIT capsule  Take 8,000 Units by mouth daily.     vitamin C 500 MG tablet  Commonly known as:  ASCORBIC ACID  Take 500 mg by mouth daily.     Vitamin D 2000 UNITS Caps  Take 2,000 Units by mouth daily.       Allergies  Allergen Reactions  . Rofecoxib     REACTION: Elevated LFT's       Follow-up Information   Follow up with Theresa Haber, MD In 1 week.   Specialty:  Internal Medicine   Contact information:   [address] [place] [id number] [phone]        The results of significant diagnostics from this hospitalization (including imaging, microbiology, ancillary and laboratory) are listed below for reference.    Significant Diagnostic Studies: Ct Head Wo Contrast  03/21/2013   CLINICAL DATA:  Sudden onset of left-sided weakness and slurred speech.  EXAM: CT HEAD WITHOUT CONTRAST  TECHNIQUE: Contiguous axial images were obtained from the base of the skull through the vertex without intravenous contrast.  COMPARISON:  MRI brain 01/22/2013.  FINDINGS: No acute cortical infarct, hemorrhage, or mass lesion is present. Mild generalized atrophy and diffuse white matter disease is similar to the prior exam. A remote left frontal lobe infarct is again seen. The ventricles are of normal size. No significant extra-axial fluid collection is present.  The paranasal sinuses and mastoid air cells are clear. The osseous skull is intact.   IMPRESSION: 1. Normal CT appearance of the brain. 2. Stable atrophy and white matter disease. 3. Remote left frontal lobe infarct.   Electronically Signed   By: Lawrence Santiago M.D.   On: 03/21/2013 11:56   Mr Jodene Nam Head Wo Contrast  03/21/2013   CLINICAL DATA:  Weakness of the left upper extremity de and lower extremity. Slurred speech.  EXAM: MRI HEAD WITHOUT CONTRAST  MRA HEAD WITHOUT CONTRAST  TECHNIQUE: Multiplanar, multiecho pulse sequences of the brain and surrounding structures were obtained without intravenous contrast. Angiographic images of the head were obtained using MRA technique without contrast.  COMPARISON:  Head CT same day  FINDINGS: MRI HEAD FINDINGS  Diffusion imaging does not show any acute or subacute infarction.  The brainstem is normal. There are old small vessel cerebellar infarctions. The cerebral hemispheres show generalized atrophy with a few old small vessel infarctions of the deep white matter. There is an old left parietal cortical and subcortical infarction. No mass lesion, acute hemorrhage, hydrocephalus or extra-axial collection. No pituitary mass. No inflammatory sinus disease.  There is extensive degenerative change in kyphotic deformity in the cervical spine, not primarily evaluated.  MRA HEAD FINDINGS  Both internal carotid arteries are widely patent into the brain. The anterior and middle cerebral vessels are patent without proximal stenosis, aneurysm or vascular malformation. Both vertebral arteries are patent with the right being dominant. Left vertebral artery is a small vessel but does give some supply to the basilar. No basilar stenosis. Posterior circulation branch vessels appear normal.  IMPRESSION: No acute infarction. Generalized atrophy. Old small vessel cerebellar infarctions. Old left parietal cortical and subcortical infarction.  No major vessel occlusion or correctable proximal stenosis.   Electronically Signed   By: Nelson Chimes M.D.   On: 03/21/2013 19:34   Mr  Brain Wo Contrast  03/21/2013   CLINICAL DATA:  Weakness of the left upper extremity de and lower extremity. Slurred speech.  EXAM: MRI HEAD WITHOUT CONTRAST  MRA HEAD WITHOUT CONTRAST  TECHNIQUE: Multiplanar, multiecho pulse sequences of the brain and surrounding structures were obtained without intravenous contrast. Angiographic images of the head were obtained using MRA technique without contrast.  COMPARISON:  Head CT same day  FINDINGS: MRI HEAD FINDINGS  Diffusion imaging does not show any acute or subacute infarction.  The brainstem is normal. There are old small vessel cerebellar infarctions. The cerebral hemispheres show generalized atrophy with a few old small vessel infarctions of the deep white matter. There is an old left parietal cortical and subcortical infarction. No mass lesion, acute hemorrhage, hydrocephalus or extra-axial collection. No pituitary mass. No inflammatory sinus disease.  There is extensive degenerative change in kyphotic deformity in the cervical spine, not primarily evaluated.  MRA HEAD FINDINGS  Both internal carotid arteries are widely patent into the brain. The anterior and middle cerebral vessels are patent without proximal stenosis, aneurysm or vascular malformation. Both vertebral arteries are patent with the right being dominant. Left vertebral artery is a small vessel but does give some supply to the basilar. No basilar stenosis. Posterior circulation branch vessels appear normal.  IMPRESSION: No acute infarction. Generalized atrophy. Old small vessel cerebellar infarctions. Old left parietal cortical and subcortical infarction.  No major vessel occlusion or correctable proximal stenosis.   Electronically Signed   By: Nelson Chimes M.D.   On: 03/21/2013 19:34    Microbiology: No results found for this or any previous visit (from the past 240 hour(s)).   Labs: Basic Metabolic Panel:  Recent Labs Lab 03/21/13 1204 03/21/13 1216  NA 141 141  K 4.3 4.2  CL 103 102   CO2 29  --   GLUCOSE 88 86  BUN 27* 27*  CREATININE 0.83 1.10  CALCIUM 9.4  --    Liver Function Tests:  Recent Labs Lab 03/21/13 1204  AST 32  ALT 20  ALKPHOS 74  BILITOT 0.7  PROT 6.5  ALBUMIN 3.7   No results found for this basename: LIPASE, AMYLASE,  in the last 168 hours No results found for this basename: AMMONIA,  in the last 168 hours CBC:  Recent Labs Lab 03/21/13 1204 03/21/13 1216  WBC 6.2  --   NEUTROABS 3.8  --   HGB 13.5 13.9  HCT 41.0 41.0  MCV 98.1  --   PLT 156  --    Cardiac Enzymes:  Recent Labs Lab 03/21/13 1207  TROPONINI <0.30   BNP: BNP (last 3 results) No results found for this basename: PROBNP,  in the last 8760 hours CBG:  Recent Labs Lab 03/21/13 2300 03/22/13 0655  GLUCAP 78 99       Signed:  Gaynelle Pastrana  [facility] 03/22/2013, 1:43 PM

## 2013-03-22 NOTE — Progress Notes (Signed)
Pt d/c to home by car with family. Assessment stable. Pt verbalizes understanding of d/c instructions. 

## 2013-03-22 NOTE — Progress Notes (Signed)
   CARE MANAGEMENT NOTE 03/22/2013  Patient:  Theresa [name], Theresa [name]   Account Number:  [id number]  Date Initiated:  03/22/2013  Documentation initiated by:  Olga Coaster  Subjective/Objective Assessment:   ADMITTED WITH TIA     Action/Plan:   CM FOLLOWING FOR DCP   Anticipated DC Date:  03/24/2013   Anticipated DC Plan:  AWAITING FOR PT/OT EVALS FOR DISPOSITION NEEDS     DC Planning Services  CM consult         Status of service:  In process, will continue to follow Medicare Important Message given?  NA - LOS <3 / Initial given by admissions (If response is "NO", the following Medicare IM given date fields will be blank)  Per UR Regulation:  Reviewed for med. necessity/level of care/duration of stay  Comments:  1/6/2015Mindi Slicker RN,BSN,MHA [phone]

## 2013-03-22 NOTE — Progress Notes (Signed)
Echo Lab  2D Echocardiogram completed.  [place], RDCS 03/22/2013 10:33 AM

## 2013-03-22 NOTE — Discharge Instructions (Signed)
Transient Ischemic Attack A transient ischemic attack (TIA) is a "warning stroke" that causes stroke-like symptoms. A TIA does not cause lasting damage to the brain. It is important to know when to get help and what to do to prevent stroke or death.  HOME CARE   Take all medicines exactly as told by your doctor. Understand all your medicine instructions.  You may need to take aspirin or warfarin medicine. Take warfarin exactly as told.  Taking too much or too little warfarin is dangerous. Blood tests must be done as often as told by your doctor. These blood tests help your doctor make sure the amount of warfarin you are taking is right. A PT blood test measures how long it takes for blood to clot. Your PT is used to calculate another value called an INR. Your PT and INR help your doctor adjust your warfarin dosage.  Food can cause problems with warfarin and affect the results of your blood tests. This is true for foods high in vitamin K. Spinach, kale, broccoli, cabbage, collard and turnip greens, brussels sprouts, peas, cauliflower, seaweed, and parsley are high in vitamin K as well as beef and pork liver, green tea, and soybean oil. Eat the same amount of food high in vitamin K. Avoid major changes in your diet. Tell your doctor before changing your diet. Talk to a food specialist (dietitian) if you have questions.  Many medicines can cause problems with warfarin and affect your PT and INR. Tell your doctor about all medicines you take. This includes vitamins and dietary pills (supplements). Be careful with aspirin and medicines that relieve redness, soreness, and puffiness (inflammation). Do not take or stop medicines unless your doctor tells you to.  Warfarin can cause a lot of bruising or bleeding. Hold pressure over cuts for longer than normal. Talk to your doctor about other side effects of warfarin.  Avoid sports or activities that may cause injury or bleeding.  Be careful when you shave,  floss your teeth, or use sharp objects.  Avoid alcoholic drinks or drink very little alcohol while taking warfarin. Tell your doctor if you change how much alcohol you drink.  Tell your dentist and other doctors that you take warfarin before procedures.  Eat 5 or more servings of fruits and vegetables a day.  Follow your diet program as told, if you are given one.  Keep a healthy weight.  Stay active. Try to get at least 30 minutes of activity on most or all days.  Do not smoke.  Limit how much alcohol you drink even if you are not taking warfarin. Moderate alcohol use is:  No more than 2 drinks each day for men.  No more than 1 drink each day for women who are not pregnant.  Stop abusing drugs.  Keep your home safe so you do not fall. Try:  Putting grab bars in the bedroom and bathroom.  Raising toilet seats.  Putting a seat in the shower.  Keep all doctor visits a told. GET HELP IF:  Your personality changes.  You have trouble swallowing.  You are seeing two of everything.  You are dizzy.  You have a fever.  Your skin starts to break down. GET HELP RIGHT AWAY IF:  The symptoms below may be a sign of an emergency. Do not wait to see if the symptoms go away. Call for help (911 in [place] Do not drive yourself to the hospital.  You have sudden weakness or numbness on   the face, arm, or leg (especially on one side of the body).  You have sudden trouble walking or moving your arms or legs.  You have sudden confusion.  You have trouble talking or understanding.  You have sudden trouble seeing in one or both eyes.  You lose your balance or your movements are not smooth.  You have a sudden, severe headache with no known cause.  You have new chest pain or you feel your heart beating in a unsteady way.  You are partly or totally unaware of what is going on around you. MAKE SURE YOU:   Understand these instructions.  Will watch your condition.  Will get  help right away if you are not doing well or get worse. Document Released: 12/11/2007 Document Revised: 02/18/2012 Document Reviewed: 04/26/2009 Exit[employer] Patient Information 2014 [employer].  

## 2013-03-22 NOTE — Progress Notes (Signed)
VASCULAR LAB PRELIMINARY  PRELIMINARY  PRELIMINARY  PRELIMINARY  Carotid duplex completed.    Preliminary report:  Bilateral:  1-39% ICA stenosis.  Vertebral artery flow is antegrade.      Tiawanna Luchsinger, RVT 03/22/2013, 10:31 AM

## 2013-03-22 NOTE — Progress Notes (Signed)
Stroke Team Progress Note  HISTORY Theresa [name] is an 80 y.o. female who was doing laundry this morning 03/21/2012 when she bent down and noted her left arm was weak. She was unable to pick her left foot up from the carpet and when she tried to call her husband she was unable to get her words out. This last lasted for about seconds to a few minutes. This has fully resolved. She was recently seen by Dr. Tomi Likens for possible TIA. Placed on Xeralto for Afib. At this time she is back to baseline. Patient was not a TPA candidate secondary to being on Xarelto and resolution of symptoms. She was admitted for further evaluation and treatment.  SUBJECTIVE Patient had only a 5-10 minute episode of left hand and leg weakness and heaviness. She had a previous episode of possible syncope on a cruise. She states she was taking xarelto with breakfast with only cereal and yogurt and was yet to take her daily dose when she had her TIA.  OBJECTIVE Most recent Vital Signs: Filed Vitals:   03/22/13 0200 03/22/13 0404 03/22/13 0500 03/22/13 0812  BP: 127/68 147/62 135/60 151/76  Pulse: 102 64 67 72  Temp: 97.3 F (36.3 C) 97.4 F (36.3 C) 97.7 F (36.5 C) 97.9 F (36.6 C)  TempSrc: Oral Oral Oral Oral  Resp: 18 18 16 16   Height:      Weight:      SpO2: 96% 95% 98% 100%   CBG (last 3)   Recent Labs  03/21/13 2300 03/22/13 0655  GLUCAP 78 99    IV Fluid Intake:     MEDICATIONS  . atorvastatin  80 mg Oral QPM  . clindamycin  1 application Topical BID  . digoxin  0.0625 mg Oral Daily  . escitalopram  5 mg Oral Daily  . etodolac  400 mg Oral BID  . levothyroxine  75 mcg Oral QAC breakfast  . metoprolol  50 mg Oral BID  . Rivaroxaban  20 mg Oral Q supper  . saccharomyces boulardii  250 mg Oral BID  . vitamin C  500 mg Oral Daily   PRN:    CLINICALLY SIGNIFICANT STUDIES Basic Metabolic Panel:  Recent Labs Lab 03/21/13 1204 03/21/13 1216  NA 141 141  K 4.3 4.2  CL 103 102  CO2 29  --    GLUCOSE 88 86  BUN 27* 27*  CREATININE 0.83 1.10  CALCIUM 9.4  --    Liver Function Tests:  Recent Labs Lab 03/21/13 1204  AST 32  ALT 20  ALKPHOS 74  BILITOT 0.7  PROT 6.5  ALBUMIN 3.7   CBC:  Recent Labs Lab 03/21/13 1204 03/21/13 1216  WBC 6.2  --   NEUTROABS 3.8  --   HGB 13.5 13.9  HCT 41.0 41.0  MCV 98.1  --   PLT 156  --    Coagulation:  Recent Labs Lab 03/21/13 1204  LABPROT 14.1  INR 1.11   Cardiac Enzymes:  Recent Labs Lab 03/21/13 1207  TROPONINI <0.30   Urinalysis:  Recent Labs Lab 03/21/13 1242  COLORURINE YELLOW  LABSPEC 1.007  PHURINE 6.5  GLUCOSEU NEGATIVE  HGBUR NEGATIVE  BILIRUBINUR SMALL*  KETONESUR NEGATIVE  PROTEINUR NEGATIVE  UROBILINOGEN 0.2  NITRITE NEGATIVE  LEUKOCYTESUR NEGATIVE   Lipid Panel    Component Value Date/Time   CHOL 119 03/22/2013 0515   TRIG 44 03/22/2013 0515   HDL 79 03/22/2013 0515   CHOLHDL 1.5 03/22/2013 0515  VLDL 9 03/22/2013 0515   LDLCALC 31 03/22/2013 0515   HgbA1C  Lab Results  Component Value Date   HGBA1C 5.6 01/13/2013    Urine Drug Screen:     Component Value Date/Time   LABOPIA NONE DETECTED 03/21/2013 1242   COCAINSCRNUR NONE DETECTED 03/21/2013 1242   LABBENZ NONE DETECTED 03/21/2013 1242   AMPHETMU NONE DETECTED 03/21/2013 1242   THCU NONE DETECTED 03/21/2013 1242   LABBARB NONE DETECTED 03/21/2013 1242    Alcohol Level:  Recent Labs Lab 03/21/13 [place] <11    CT of the brain  03/21/2013    1. Normal CT appearance of the brain. 2. Stable atrophy and white matter disease. 3. Remote left frontal lobe infarct.    MRI of the brain  03/21/2013   No acute infarction. Generalized atrophy. Old small vessel cerebellar infarctions. Old left parietal cortical and subcortical infarction.    MRA of the brain  03/21/2013    No major vessel occlusion or correctable proximal stenosis.   2D Echocardiogram  EF 55% with no source of embolus.   Carotid Doppler  No evidence of hemodynamically significant  internal carotid artery stenosis. Vertebral artery flow is antegrade.   Therapy Recommendations no therapy needs anticipated  Physical Exam   Pleasant elderly Caucasian lady currently not in distress.Awake alert. Afebrile. Head is nontraumatic. Neck is supple without bruit. Hearing is normal. Cardiac exam no murmur or gallop. Lungs are clear to auscultation. Distal pulses are well felt. Neurological Exam ;   Awake  Alert oriented x 3. Normal speech and language.eye movements full without nystagmus.fundi were not visualized. Vision acuity and fields appear normal. Hearing is normal. Palatal movements are normal. Face symmetric. Tongue midline. Normal strength, tone, reflexes and coordination. Normal sensation. Gait deferred. ASSESSMENT Theresa [name] is a 80 y.o. female presenting with transient left arm and left leg weakness, dysarthria. Imaging confirms no acute infarct. Dx:  Right brain TIA, felt to be  embolic secondary to known atrial fibrillation.  On xarelto prior to admission - was taking it in the morning with a light meal. Symptoms occurred just prior to breakfast and her taking her usual dose. Concerned level may have been low. Encouraged pt to take with a heavier meal.    Hx left parietal CVA 03/50, embolic atrial fibrillation   Hospital day # 1  TREATMENT/PLAN  Continue xarelto for secondary stroke prevention. Take with a big meal. In order to decrease hepatitic first pass effect and early clearance  Follow up with Dr. Loretta Plume, pt's neurologist prior to admission  No further stroke workup indicated. Patient has a 10-15% risk of having another stroke over the next year, the highest risk is within 2 weeks of the most recent stroke/TIA (risk of having a stroke following a stroke or TIA is the same). Ongoing risk factor control by Primary Care Physician Stroke Service will sign off. Please call should any needs arise.   Burnetta Sabin, MSN, RN, ANVP-BC, ANP-BC, [facility] Pager: [phone] 03/22/2013 12:01 PM  I have personally obtained a history, examined the patient, evaluated imaging results, and formulated the assessment and plan of care. I agree with the above. Antony Contras, MD

## 2013-03-22 NOTE — Progress Notes (Signed)
Pt did receive 100 mg of metoprolol last night per order and had some non-sustained bradys. Pt's HR will occasionally drop into the 50s, 40s and the lowewest was 38 and then come right back again. Pt was asymptomatic during those episodes. Will continue to monitor pt-----Cayleigh Paull, rn

## 2013-03-22 NOTE — Progress Notes (Signed)
Pt admitted to 4n25, pt is alert x4, vital signs are stable, pt has a hx of A-fib, and is in A-fib, HR controlled, md was aware in the ed before pt got on the floor. Assessment and admission hx has been completed. Will continue to monitor pt------Yanis Larin, rn

## 2013-03-23 ENCOUNTER — Telehealth: Payer: Self-pay | Admitting: Internal Medicine

## 2013-03-23 NOTE — Telephone Encounter (Signed)
Pt had imaging studies of the brain performed this week and was told there were no significant findings but pt should follow up with PCP.  Pt needs to know if Dr. Arnoldo Morale has reviewed these results and if the patient needs to be seen.

## 2013-03-24 ENCOUNTER — Other Ambulatory Visit: Payer: Self-pay | Admitting: *Deleted

## 2013-03-24 ENCOUNTER — Telehealth: Payer: Self-pay | Admitting: *Deleted

## 2013-03-24 NOTE — Telephone Encounter (Signed)
Transitional care  admit date:03/21/2013 Discharge date:03/22/2013  Discharge diagnoses: Principal problem:  TIA Active problems:  hypothyroidism  MVP  atrial fibrillation  osteoporosis  scoliosis,lumbar spine  hypertension  Discharge condition: fair  Recommendations for outpatient follow-up Home with outpt PCP follow up  Talked with patient and she went to ed because of slurred speech, weakness in upper extremity and lower extremity.  She is concerned about results of head MRI and anxious to discuss with Dr Arnoldo Morale.  States she feels almost normal ow and is ambulating around in the house as she was before going to ed.  She is knowledgeable and compliant with all medication regimen.  Husband helps with he healthcare.  Patient has a post hospital follow up with dr Arnoldo Morale on 03-24-2013.

## 2013-03-25 ENCOUNTER — Ambulatory Visit (INDEPENDENT_AMBULATORY_CARE_PROVIDER_SITE_OTHER): Payer: Medicare Other | Admitting: Internal Medicine

## 2013-03-25 ENCOUNTER — Encounter: Payer: Self-pay | Admitting: Internal Medicine

## 2013-03-25 VITALS — BP 130/80 | HR 72 | Temp 97.7°F | Resp 16 | Ht 61.0 in | Wt 104.0 lb

## 2013-03-25 DIAGNOSIS — G459 Transient cerebral ischemic attack, unspecified: Secondary | ICD-10-CM

## 2013-03-25 DIAGNOSIS — I10 Essential (primary) hypertension: Secondary | ICD-10-CM

## 2013-03-25 DIAGNOSIS — I4891 Unspecified atrial fibrillation: Secondary | ICD-10-CM

## 2013-03-25 NOTE — Progress Notes (Signed)
Pre visit review using our clinic review tool, if applicable. No additional management support is needed unless otherwise documented below in the visit note. 

## 2013-03-25 NOTE — Progress Notes (Signed)
Subjective:    Patient ID: Theresa [name], female    DOB: Nov 10, 1931, 80 y.o.   MRN: [id number]  HPI Was admitted for 24 hours of observation for TIA Resumed the digoxin and lowered the lopressor Was discharged Monday   And the lopressor dose was reversed due to the fact that the observed bradycardia was during sleep. No lasting symptoms reported At baseline  Open issues blood pressures control Proper use of the xarelto dosing with meds AF control Use of NSAIDS with xarelto    Review of Systems  Constitutional: Negative for activity change, appetite change and fatigue.  HENT: Negative for congestion, ear pain, postnasal drip and sinus pressure.   Eyes: Negative for redness and visual disturbance.  Respiratory: Positive for shortness of breath and wheezing. Negative for cough.   Gastrointestinal: Negative for abdominal pain and abdominal distention.  Genitourinary: Negative for dysuria, frequency and menstrual problem.  Musculoskeletal: Negative for arthralgias, joint swelling, myalgias and neck pain.  Skin: Negative for rash and wound.  Neurological: Positive for dizziness, weakness, light-headedness and numbness. Negative for headaches.  Hematological: Negative for adenopathy. Does not bruise/bleed easily.  Psychiatric/Behavioral: Negative for sleep disturbance and decreased concentration.   Past Medical History  Diagnosis Date  . Osteoarthritis   . Osteoporosis   . History of postmenopausal HRT   . MVP (mitral valve prolapse)     a. s/p MV repair at [facility] in 2008;  b. Echocardiogram 7/12: EF 50%, status post mitral valve repair with mild MR and minimal MS, mean gradient 4, moderate LAE, LA diam 55 mm; mild RAE, PASP 28-32;   Marland Kitchen Hypothyroidism   . Hx of adenomatous colonic polyps   . Collagenous colitis   . Atrial fibrillation     AFIB  . CVA (cerebral vascular accident)     a.  L parietal CVA by MRI 40/98, likely embolic;  b.  TEE by report with neg bubble study;  c.   carotid dopplers  12/12:  + plaque, no sig ICA stenosis     History   Social History  . Marital Status: Married    Spouse Name: N/A    Number of Children: N/A  . Years of Education: N/A   Occupational History  . RETIRED    Social History Main Topics  . Smoking status: Former Smoker    Quit date: 04/28/1966  . Smokeless tobacco: Never Used  . Alcohol Use: 0.6 oz/week    1 Glasses of wine per week     Comment: 1 small glass with luch and dinner  . Drug Use: No  . Sexual Activity: Not Currently   Other Topics Concern  . Not on file   Social History Narrative  . No narrative on file    Past Surgical History  Procedure Laterality Date  . Cholecystectomy    . Lumbar fusion    . Tonsillectomy    . Dilation and curettage of uterus    . Cataract extraction    . Mitral valve repair  2008  . Cardioversion  01/09/2012    Procedure: CARDIOVERSION;  Surgeon: Jolaine Artist, MD;  Location: [facility] ENDOSCOPY;  Service: Cardiovascular;  Laterality: N/A;    Family History  Problem Relation Age of Onset  . Cancer Father     COLON  . Stroke Neg Hx     1ST DEGREE RELATIVE <60    Allergies  Allergen Reactions  . Rofecoxib     REACTION: Elevated LFT's    Current  Outpatient Prescriptions on File Prior to Visit  Medication Sig Dispense Refill  . Ascorbic Acid (VITAMIN C) 500 MG tablet Take 500 mg by mouth daily.        Marland Kitchen atorvastatin (LIPITOR) 20 MG tablet Take 20 mg by mouth every evening.      . Calcium Carbonate-Vitamin D (CALTRATE 600+D) 600-400 MG-UNIT per tablet Take 1 tablet by mouth 2 (two) times daily.        . Cholecalciferol (VITAMIN D) 2000 UNITS CAPS Take 2,000 Units by mouth daily.       . clindamycin (CLEOCIN T) 1 % lotion Apply 1 application topically 2 (two) times daily. Apply to face for rosacea  60 mL  3  . digoxin (LANOXIN) 0.125 MG tablet Take 0.5 tablets (0.0625 mg total) by mouth daily.  30 tablet  3  . escitalopram (LEXAPRO) 5 MG tablet Take 1 tablet (5  mg total) by mouth daily.  30 tablet  3  . etodolac (LODINE) 400 MG tablet Take 400 mg by mouth 2 (two) times daily.      . Folic Acid-Vit S9-QZR A07 (FOLBEE) 2.5-25-1 MG TABS tablet Take 1 tablet by mouth daily.  30 tablet  11  . glucosamine-chondroitin 500-400 MG tablet Take 1 tablet by mouth 2 (two) times daily.        Marland Kitchen levothyroxine (SYNTHROID, LEVOTHROID) 75 MCG tablet Take 75 mcg by mouth daily before breakfast.      . metoprolol (LOPRESSOR) 100 MG tablet Take 1 tablet (100 mg total) by mouth 2 (two) times daily.  180 tablet  3  . Multiple Vitamin (MULTIVITAMIN) tablet Take 1 tablet by mouth every evening.       . Multiple Vitamins-Minerals (PRESERVISION AREDS 2 PO) Take 1 tablet by mouth 2 (two) times daily.      . Rivaroxaban (XARELTO) 20 MG TABS tablet Take 1 tablet (20 mg total) by mouth daily with supper.  30 tablet  0  . saccharomyces boulardii (FLORASTOR) 250 MG capsule Take 250 mg by mouth 2 (two) times daily.      . vitamin A 8000 UNIT capsule Take 8,000 Units by mouth daily.       No current facility-administered medications on file prior to visit.    BP 130/80  Pulse 72  Temp(Src) 97.7 F (36.5 C)  Resp 16  Ht 5\' 1"  (1.549 m)  Wt 104 lb (47.174 kg)  BMI 19.66 kg/m2       Objective:   Physical Exam  Nursing note and vitals reviewed. Constitutional: She is oriented to person, place, and time. She appears well-developed and well-nourished. No distress.  HENT:  Head: Normocephalic and atraumatic.  Right Ear: External ear normal.  Left Ear: External ear normal.  Nose: Nose normal.  Mouth/Throat: Oropharynx is clear and moist.  Eyes: Conjunctivae and EOM are normal. Pupils are equal, round, and reactive to light.  Neck: Normal range of motion. Neck supple. No JVD present. No tracheal deviation present. No thyromegaly present.  Cardiovascular:  No murmur heard. Irregular rate  Pulmonary/Chest: Effort normal and breath sounds normal. She has no wheezes. She  exhibits no tenderness.  Abdominal: Soft. Bowel sounds are normal.  Musculoskeletal: Normal range of motion. She exhibits no edema and no tenderness.  Lymphadenopathy:    She has no cervical adenopathy.  Neurological: She is alert and oriented to person, place, and time. She has normal reflexes. No cranial nerve deficit.  Skin: Skin is warm and dry. She is not diaphoretic.  Psychiatric: She has a normal mood and affect. Her behavior is normal.          Assessment & Plan:  The paitient is a 80 yo female with AF ( rate controlled and blood pressure controlled) on dig, lopressor and xarelto.she was admitted for 24-hour observation for any TIA symptoms that had completely resolved by the time she got to the hospital.  She has a history of mitral valve repair she has a history of prior CVA in 2012. She presented with a history of slurred speech and weakness when she was picking up her laundry the symptoms had resolved by the time she was evaluated in the emergency room she was not a candidate and is not a candidate for TPA due to the use of xarelto anticoagulant. During her observation stay an MRI was performed which showed no new lesions and the patient was instructed to take her anticoagulant with a meal to increase its effectiveness.  Today we discussed the changes made in the hospital and agreed with continuing the Lopressor at its current dose and the digoxin at the current dose on examination she is at her baseline and her heart rate is between 60 and 70 but she is in atrial fibrillation.  We discussed the red flag markers for her for calling 911 given the fact that she is already on an oral anticoagulant and would not be a candidate for TPA does wet flag salt progressive neurologic symptoms that started out with a minor symptom and began to spread with additional symptoms such as weakness confusion visual change would indicate an immediate need to be evaluated her symptomology resolve after  15-20 minutes then she would be safe to report that event to her primary care physician and neurologist without evaluation.

## 2013-03-25 NOTE — Patient Instructions (Signed)
Symptomology of a mild TIA it is safe to observe that for 15-20 minutes since she withdrawn and affect with little anticoagulant.  Exceptions to this would be if you  had stopped her oral anticoagulant for any reason or if the symptoms were progressive. And if the symptoms lasted longer than 45 min even if there were "mild"  Of course if the symptoms associated with chest pain shortness of breath sweating or nausea should be evaluated immediately

## 2013-04-20 DIAGNOSIS — I10 Essential (primary) hypertension: Secondary | ICD-10-CM

## 2013-04-20 DIAGNOSIS — I4891 Unspecified atrial fibrillation: Secondary | ICD-10-CM

## 2013-04-20 DIAGNOSIS — G459 Transient cerebral ischemic attack, unspecified: Secondary | ICD-10-CM

## 2013-05-10 ENCOUNTER — Ambulatory Visit (INDEPENDENT_AMBULATORY_CARE_PROVIDER_SITE_OTHER): Payer: Medicare Other | Admitting: Cardiovascular Disease

## 2013-05-10 ENCOUNTER — Encounter: Payer: Self-pay | Admitting: Cardiovascular Disease

## 2013-05-10 VITALS — BP 150/80 | Ht 61.0 in | Wt 105.4 lb

## 2013-05-10 DIAGNOSIS — I4891 Unspecified atrial fibrillation: Secondary | ICD-10-CM

## 2013-05-10 DIAGNOSIS — G459 Transient cerebral ischemic attack, unspecified: Secondary | ICD-10-CM

## 2013-05-10 DIAGNOSIS — I10 Essential (primary) hypertension: Secondary | ICD-10-CM

## 2013-05-10 DIAGNOSIS — I059 Rheumatic mitral valve disease, unspecified: Secondary | ICD-10-CM

## 2013-05-10 NOTE — Assessment & Plan Note (Signed)
03/22/13  No residual MR post repair EF 55%  Needs SBE with ring in place

## 2013-05-10 NOTE — Assessment & Plan Note (Signed)
Indicates it usually runs ok at home  Continue to monitor per Dr Regis Bill and add low dose ACE if running high

## 2013-05-10 NOTE — Progress Notes (Signed)
Patient ID: Theresa [name], female   DOB: 11-15-1931, 80 y.o.   MRN: [id number] She has a history of mitral regurgitation, status post mitral valve repair at [facility] in 2008, paroxysmal atrial fibrillation, prior stroke in 12/12 at [facility] (felt to be embolic), on chronic Coumadin therapy, colitis, Raynaud's. She was seen in the emergency room 11/13/11 with dizziness. Head CT demonstrated atrophy and chronic microvascular ischemic changes, no acute findings. MRI did not show anything acute. EKG demonstrated atrial fibrillation with a heart rate of 122. She did see her PCP in followup who adjusted her Toprol for rate control. Subsequently cardioverted by Dr Harrington Challenger 10/13  Echo 11/26/11  Study Conclusions  - Left ventricle: The cavity size was normal. Wall thickness was normal. The estimated ejection fraction was 50%. Diffuse hypokinesis. - Mitral valve: S/P repair with mild residual MR Mild regurgitation. - Left atrium: The atrium was moderately to severely dilated. - Atrial septum: No defect or patent foramen ovale was identified.  BP stable with beta blocker Seems like she has infrequent PAF      ROS: Denies fever, malais, weight loss, blurry vision, decreased visual acuity, cough, sputum, SOB, hemoptysis, pleuritic pain, palpitaitons, heartburn, abdominal pain, melena, lower extremity edema, claudication, or rash.  All other systems reviewed and negative  General: Affect appropriate Healthy:  appears stated age 64: normal Neck supple with no adenopathy JVP normal no bruits no thyromegaly Lungs clear with no wheezing and good diaphragmatic motion Heart:  S1/S2 no murmur, no rub, gallop or click PMI normal Abdomen: benighn, BS positve, no tenderness, no AAA no bruit.  No HSM or HJR Distal pulses intact with no bruits No edema Neuro non-focal Skin warm and dry No muscular weakness   Current Outpatient Prescriptions  Medication Sig Dispense Refill  .  Ascorbic Acid (VITAMIN C) 500 MG tablet Take 500 mg by mouth daily.        Marland Kitchen atorvastatin (LIPITOR) 20 MG tablet Take 20 mg by mouth every evening.      . Calcium Carbonate-Vitamin D (CALTRATE 600+D) 600-400 MG-UNIT per tablet Take 1 tablet by mouth 2 (two) times daily.        . Cholecalciferol (VITAMIN D) 2000 UNITS CAPS Take 2,000 Units by mouth daily.       . clindamycin (CLEOCIN T) 1 % lotion Apply 1 application topically 2 (two) times daily. Apply to face for rosacea  60 mL  3  . digoxin (LANOXIN) 0.125 MG tablet Take 0.5 tablets (0.0625 mg total) by mouth daily.  30 tablet  3  . escitalopram (LEXAPRO) 5 MG tablet Take 1 tablet (5 mg total) by mouth daily.  30 tablet  3  . etodolac (LODINE) 400 MG tablet Take 400 mg by mouth 2 (two) times daily.      . Folic Acid-Vit G1-WEX H37 (FOLBEE) 2.5-25-1 MG TABS tablet Take 1 tablet by mouth daily.  30 tablet  11  . glucosamine-chondroitin 500-400 MG tablet Take 1 tablet by mouth 2 (two) times daily.        Marland Kitchen levothyroxine (SYNTHROID, LEVOTHROID) 75 MCG tablet Take 75 mcg by mouth daily before breakfast.      . metoprolol (LOPRESSOR) 100 MG tablet Take 1 tablet (100 mg total) by mouth 2 (two) times daily.  180 tablet  3  . Multiple Vitamin (MULTIVITAMIN) tablet Take 1 tablet by mouth every evening.       . Multiple Vitamins-Minerals (PRESERVISION AREDS 2 PO) Take 1 tablet by mouth 2 (  two) times daily.      . Rivaroxaban (XARELTO) 20 MG TABS tablet Take 1 tablet (20 mg total) by mouth daily with supper.  30 tablet  0  . saccharomyces boulardii (FLORASTOR) 250 MG capsule Take 250 mg by mouth 2 (two) times daily.      . vitamin A 8000 UNIT capsule Take 8,000 Units by mouth daily.       No current facility-administered medications for this visit.    Allergies  Rofecoxib  Electrocardiogram:  1/6  Afib rate 75 otherwise normal   Assessment and Plan

## 2013-05-10 NOTE — Assessment & Plan Note (Signed)
Previous failed cardioversion.  Continue rate control and anticoagulation

## 2013-05-10 NOTE — Patient Instructions (Signed)
Your physician wants you to follow-up in:   [place]  ECHO [employer] will receive a reminder letter in the mail two months in advance. If you don't receive a letter, please call our office to schedule the follow-up appointment. Your physician recommends that you continue on your current medications as directed. Please refer to the Current Medication list given to you today. Your physician has requested that you have an echocardiogram. Echocardiography is a painless test that uses sound waves to create images of your heart. It provides your doctor with information about the size and shape of your heart and how well your heart's chambers and valves are working. This procedure takes approximately one hour. There are no restrictions for this procedure.

## 2013-05-10 NOTE — Assessment & Plan Note (Signed)
Continue xarelto  Weighs 102 lbs with normal Cr  TIA on xarelto so would not lower dose

## 2013-05-20 ENCOUNTER — Telehealth: Payer: Self-pay | Admitting: Internal Medicine

## 2013-05-20 DIAGNOSIS — I482 Chronic atrial fibrillation, unspecified: Secondary | ICD-10-CM

## 2013-05-20 MED ORDER — DIGOXIN 125 MCG PO TABS: 0.0625 mg | ORAL_TABLET | Freq: Every day | ORAL | Status: DC

## 2013-05-20 NOTE — Telephone Encounter (Signed)
rx sent in electronically 

## 2013-05-20 NOTE — Telephone Encounter (Signed)
Pt needs refill of digoxin (LANOXIN) 0.125 MG tablet Pt will be out Sunday and needs today if possible.  Pt has called pharm several times . Pt has appt 4/17 for fu. [employer]

## 2013-06-27 ENCOUNTER — Other Ambulatory Visit: Payer: Self-pay | Admitting: Internal Medicine

## 2013-07-01 ENCOUNTER — Ambulatory Visit: Payer: Medicare Other | Admitting: Internal Medicine

## 2013-07-01 ENCOUNTER — Ambulatory Visit (INDEPENDENT_AMBULATORY_CARE_PROVIDER_SITE_OTHER): Payer: Medicare Other | Admitting: Internal Medicine

## 2013-07-01 ENCOUNTER — Encounter: Payer: Self-pay | Admitting: Internal Medicine

## 2013-07-01 VITALS — BP 120/80 | HR 58 | Temp 97.4°F | Wt 106.0 lb

## 2013-07-01 DIAGNOSIS — I4891 Unspecified atrial fibrillation: Secondary | ICD-10-CM

## 2013-07-01 DIAGNOSIS — I6350 Cerebral infarction due to unspecified occlusion or stenosis of unspecified cerebral artery: Secondary | ICD-10-CM

## 2013-07-01 DIAGNOSIS — I639 Cerebral infarction, unspecified: Secondary | ICD-10-CM

## 2013-07-01 MED ORDER — RIVAROXABAN 20 MG PO TABS: 20.0000 mg | ORAL_TABLET | Freq: Every day | ORAL | Status: DC

## 2013-07-01 NOTE — Progress Notes (Signed)
Subjective:    Patient ID: Theresa [name], female    DOB: 10/22/1931, 80 y.o.   MRN: [id number]  HPI Follow up for anticoagulation on xarelto and atrial fib started probiotics   Review of Systems  Constitutional: Positive for fatigue. Negative for activity change and appetite change.  HENT: Negative for congestion, ear pain, postnasal drip and sinus pressure.   Eyes: Negative for redness and visual disturbance.  Respiratory: Negative for cough, shortness of breath and wheezing.   Gastrointestinal: Negative for abdominal pain and abdominal distention.  Genitourinary: Negative for dysuria, frequency and menstrual problem.  Musculoskeletal: Negative for arthralgias, joint swelling, myalgias and neck pain.  Skin: Negative for rash and wound.  Neurological: Positive for speech difficulty and weakness. Negative for dizziness and headaches.  Hematological: Negative for adenopathy. Does not bruise/bleed easily.  Psychiatric/Behavioral: Negative for sleep disturbance and decreased concentration.   Past Medical History  Diagnosis Date  . Osteoarthritis   . Osteoporosis   . History of postmenopausal HRT   . MVP (mitral valve prolapse)     a. s/p MV repair at [facility] in 2008;  b. Echocardiogram 7/12: EF 50%, status post mitral valve repair with mild MR and minimal MS, mean gradient 4, moderate LAE, LA diam 55 mm; mild RAE, PASP 28-32;   Marland Kitchen Hypothyroidism   . Hx of adenomatous colonic polyps   . Collagenous colitis   . Atrial fibrillation     AFIB  . CVA (cerebral vascular accident)     a.  L parietal CVA by MRI 69/67, likely embolic;  b.  TEE by report with neg bubble study;  c.  carotid dopplers  12/12:  + plaque, no sig ICA stenosis     History   Social History  . Marital Status: Married    Spouse Name: N/A    Number of Children: N/A  . Years of Education: N/A   Occupational History  . RETIRED    Social History Main Topics  . Smoking status: Former Smoker    Quit date:  04/28/1966  . Smokeless tobacco: Never Used  . Alcohol Use: 0.6 oz/week    1 Glasses of wine per week     Comment: 1 small glass with luch and dinner  . Drug Use: No  . Sexual Activity: Not Currently   Other Topics Concern  . Not on file   Social History Narrative  . No narrative on file    Past Surgical History  Procedure Laterality Date  . Cholecystectomy    . Lumbar fusion    . Tonsillectomy    . Dilation and curettage of uterus    . Cataract extraction    . Mitral valve repair  2008  . Cardioversion  01/09/2012    Procedure: CARDIOVERSION;  Surgeon: Jolaine Artist, MD;  Location: [facility] ENDOSCOPY;  Service: Cardiovascular;  Laterality: N/A;    Family History  Problem Relation Age of Onset  . Cancer Father     COLON  . Stroke Neg Hx     1ST DEGREE RELATIVE <60    Allergies  Allergen Reactions  . Rofecoxib     REACTION: Elevated LFT's    Current Outpatient Prescriptions on File Prior to Visit  Medication Sig Dispense Refill  . Ascorbic Acid (VITAMIN C) 500 MG tablet Take 500 mg by mouth daily.        Marland Kitchen atorvastatin (LIPITOR) 20 MG tablet Take 20 mg by mouth every evening.      . Calcium  Carbonate-Vitamin D (CALTRATE 600+D) 600-400 MG-UNIT per tablet Take 1 tablet by mouth 2 (two) times daily.        . Cholecalciferol (VITAMIN D) 2000 UNITS CAPS Take 2,000 Units by mouth daily.       . clindamycin (CLEOCIN T) 1 % lotion Apply 1 application topically 2 (two) times daily. Apply to face for rosacea  60 mL  3  . digoxin (LANOXIN) 0.125 MG tablet Take 0.5 tablets (0.0625 mg total) by mouth daily.  30 tablet  1  . escitalopram (LEXAPRO) 5 MG tablet TAKE ONE TABLET BY MOUTH ONCE DAILY  30 tablet  0  . etodolac (LODINE) 400 MG tablet Take 400 mg by mouth 2 (two) times daily.      . Folic Acid-Vit Q1-JHE R74 (FOLBEE) 2.5-25-1 MG TABS tablet Take 1 tablet by mouth daily.  30 tablet  11  . glucosamine-chondroitin 500-400 MG tablet Take 1 tablet by mouth 2 (two) times daily.         Marland Kitchen levothyroxine (SYNTHROID, LEVOTHROID) 75 MCG tablet Take 75 mcg by mouth daily before breakfast.      . metoprolol (LOPRESSOR) 100 MG tablet Take 1 tablet (100 mg total) by mouth 2 (two) times daily.  180 tablet  3  . Multiple Vitamin (MULTIVITAMIN) tablet Take 1 tablet by mouth every evening.       . Multiple Vitamins-Minerals (PRESERVISION AREDS 2 PO) Take 1 tablet by mouth 2 (two) times daily.      . Rivaroxaban (XARELTO) 20 MG TABS tablet Take 1 tablet (20 mg total) by mouth daily with supper.  30 tablet  0  . saccharomyces boulardii (FLORASTOR) 250 MG capsule Take 250 mg by mouth daily.       . vitamin A 8000 UNIT capsule Take 8,000 Units by mouth daily.       No current facility-administered medications on file prior to visit.    BP 120/80  Pulse 58  Temp(Src) 97.4 F (36.3 C) (Oral)  Wt 106 lb (48.081 kg)       Objective:   Physical Exam  Nursing note reviewed. Constitutional: She is oriented to person, place, and time. She appears well-developed and well-nourished. No distress.  HENT:  Head: Normocephalic and atraumatic.  Eyes: Conjunctivae and EOM are normal. Pupils are equal, round, and reactive to light.  Neck: Normal range of motion. Neck supple. No JVD present. No tracheal deviation present. No thyromegaly present.  Cardiovascular: Normal rate and intact distal pulses.   Murmur heard. irregular  Pulmonary/Chest: Effort normal and breath sounds normal. She has no wheezes. She exhibits no tenderness.  Abdominal: Soft. Bowel sounds are normal.  Musculoskeletal: Normal range of motion. She exhibits no edema and no tenderness.  Lymphadenopathy:    She has no cervical adenopathy.  Neurological: She is alert and oriented to person, place, and time. She has normal reflexes. No cranial nerve deficit.  Skin: Skin is warm and dry. She is not diaphoretic.          Assessment & Plan:  Blood pressure good No further episodes of slurred speech Dr Loretta Plume following  her and has not been driving Has been 3 moths without an event June 1 may drive if no further episodes  Lipids at goal in January  Possible reduction of xarelto  Discussion of dosage Walking every AM

## 2013-07-25 ENCOUNTER — Other Ambulatory Visit: Payer: Self-pay | Admitting: Internal Medicine

## 2013-08-17 ENCOUNTER — Ambulatory Visit (INDEPENDENT_AMBULATORY_CARE_PROVIDER_SITE_OTHER): Payer: Medicare Other | Admitting: Neurology

## 2013-08-17 ENCOUNTER — Encounter: Payer: Self-pay | Admitting: Neurology

## 2013-08-17 VITALS — BP 104/66 | HR 68 | Resp 16 | Ht 62.21 in | Wt 105.5 lb

## 2013-08-17 DIAGNOSIS — F329 Major depressive disorder, single episode, unspecified: Secondary | ICD-10-CM

## 2013-08-17 DIAGNOSIS — R4189 Other symptoms and signs involving cognitive functions and awareness: Secondary | ICD-10-CM

## 2013-08-17 DIAGNOSIS — G459 Transient cerebral ischemic attack, unspecified: Secondary | ICD-10-CM

## 2013-08-17 DIAGNOSIS — F3289 Other specified depressive episodes: Secondary | ICD-10-CM

## 2013-08-17 DIAGNOSIS — F32A Depression, unspecified: Secondary | ICD-10-CM

## 2013-08-17 NOTE — Progress Notes (Signed)
NEUROLOGY FOLLOW UP OFFICE NOTE  Theresa [name] [id number]  HISTORY OF PRESENT ILLNESS: Theresa [name] is an 80 year old right-hand woman with history of stroke, ophthalmic migraines, paroxysmal atrial fibrillation (on Xarelto) and MVP who follows up for episode of loss of awareness and cognitive deficits.  She is accompanied by her husband.  Records personally reviewed.    UPDATE: She was admitted to [facility] on 03/21/13 for left upper and lower extremity weakness and slurred speech.  She was not a tPA candidate due to use of Xarelto.  MRI of the brain revealed no acute infarct.  2D echo revealed normal EF of 55%.  Carotid doppler revealed no hemodynamically significant stenosis.  INR was 1.11, Hgb A1c 5.5 and LDL 31.  She endorsed compliance with Xarelto and was instructed to start taking with supper for better absorption.  Digoxin was resumed and Lopressor was lowered due to bradycardia.  Last visit, she expressed feeling depressed and Dr. Arnoldo Morale started her on Lexapro 5mg  daily.  She felt that it helped initially but she feels she is starting to feel more depressed.  She also notes feeling more indecisive about some things, mainly deciding which clothes to wear.  She denies any confusion or episodes of altered consciousness.  She has not driven since the TIA in January.  She did recently renew her license and passed the written exam.  HISTORY: In October 2014, while on a riverboat cruise in [place], she had a transient episode of loss of consciousness.  They were sitting in the lounge and she was looking at the newsletter.  She began nodding her head and she lost consciousness.  She remained in the chair, stiff and dropped the newsletter.  Her husband went to get help.  It lasted about 5 minutes and she woke up.  She was not confused when she woke up.  She did not have any convulsions, repetitive movements, urinary incontinence or tongue biting.  The next morning, she woke up and was confused.   She knew what words she wanted to say but had difficulty how to use them.  She understood her husband.  She was not amnestic to the event.  She thought she was still at home and didn't know they were already on the trip.  Once she and her husband left their cabin, she was fully oriented.  The following morning, she woke up early and was talking about taking souvenirs back home.  She fell back to sleep and later in the morning she was fine.  The EMS on the boat discussed sending her out to a hospital but felt that she was not in any danger at this point.  Since getting home, she has had difficulty performing simple daily tasks.  She has had difficulty operating the microwave, stove and the telephone.  She has also had more difficulty with direction in the car.  She currently does not drive.  She has had some cognitive difficulty since her stroke, but seems more pronounced since this last episode. Due to residual numbness in right hand from the stroke, she has some trouble operating the TV remote.  Since last visit, there has been some improvement in performing these tasks.  She does not have any focal weakness.  She notes more difficulty with word-finding, particularly when talking with other people, especially people she doesn't know well.  She feels self-conscious.   She has not had any more spells.  She sustained a left parietal ischemic stroke presenting  as aphasia, right facial weakness and right hand numbness in December 2012, likely cardio-embolic.  She has subsequently been on anticoagulation and currently is on Xarelto.    Since the stroke, she would have periods of mild disorientation.  It is not too bad.  Usually, when she is riding with her husband, she is not sure if they should make a left or right to get to their destination.  This is something she used to be sharp about.  MRI of brain performed 01/22/13 revealed stable remote hemorrhagic infarct in posterior left frontal lobe and stable remote  lacunar infarcts in cerebellum, but no acute stroke.  EEG performed 01/25/13 was normal. MMSE performed 02/16/14 was 30/30  PAST MEDICAL HISTORY: Past Medical History  Diagnosis Date  . Osteoarthritis   . Osteoporosis   . History of postmenopausal HRT   . MVP (mitral valve prolapse)     a. s/p MV repair at [facility] in 2008;  b. Echocardiogram 7/12: EF 50%, status post mitral valve repair with mild MR and minimal MS, mean gradient 4, moderate LAE, LA diam 55 mm; mild RAE, PASP 28-32;   Marland Kitchen Hypothyroidism   . Hx of adenomatous colonic polyps   . Collagenous colitis   . Atrial fibrillation     AFIB  . CVA (cerebral vascular accident)     a.  L parietal CVA by MRI 82/42, likely embolic;  b.  TEE by report with neg bubble study;  c.  carotid dopplers  12/12:  + plaque, no sig ICA stenosis     MEDICATIONS: Current Outpatient Prescriptions on File Prior to Visit  Medication Sig Dispense Refill  . Ascorbic Acid (VITAMIN C) 500 MG tablet Take 500 mg by mouth daily.        Marland Kitchen atorvastatin (LIPITOR) 20 MG tablet Take 20 mg by mouth every evening.      . Calcium Carbonate-Vitamin D (CALTRATE 600+D) 600-400 MG-UNIT per tablet Take 1 tablet by mouth 2 (two) times daily.        . Cholecalciferol (VITAMIN D) 2000 UNITS CAPS Take 2,000 Units by mouth daily.       . clindamycin (CLEOCIN T) 1 % lotion Apply 1 application topically 2 (two) times daily. Apply to face for rosacea  60 mL  3  . digoxin (LANOXIN) 0.125 MG tablet Take 0.5 tablets (0.0625 mg total) by mouth daily.  30 tablet  1  . escitalopram (LEXAPRO) 5 MG tablet TAKE ONE TABLET BY MOUTH ONCE DAILY  30 tablet  0  . etodolac (LODINE) 400 MG tablet Take 400 mg by mouth 2 (two) times daily.      . Folic Acid-Vit P5-TIR W43 (FOLBEE) 2.5-25-1 MG TABS tablet Take 1 tablet by mouth daily.  30 tablet  11  . glucosamine-chondroitin 500-400 MG tablet Take 1 tablet by mouth 2 (two) times daily.        Marland Kitchen levothyroxine (SYNTHROID, LEVOTHROID) 75 MCG tablet  Take 75 mcg by mouth daily before breakfast.      . metoprolol (LOPRESSOR) 100 MG tablet Take 1 tablet (100 mg total) by mouth 2 (two) times daily.  180 tablet  3  . Multiple Vitamin (MULTIVITAMIN) tablet Take 1 tablet by mouth every evening.       . Multiple Vitamins-Minerals (PRESERVISION AREDS 2 PO) Take 1 tablet by mouth 2 (two) times daily.      . rivaroxaban (XARELTO) 20 MG TABS tablet Take 1 tablet (20 mg total) by mouth daily with supper.  [place]  tablet  3  . saccharomyces boulardii (FLORASTOR) 250 MG capsule Take 250 mg by mouth daily.       . vitamin A 8000 UNIT capsule Take 8,000 Units by mouth daily.       No current facility-administered medications on file prior to visit.    ALLERGIES: Allergies  Allergen Reactions  . Rofecoxib     REACTION: Elevated LFT's    FAMILY HISTORY: Family History  Problem Relation Age of Onset  . Cancer Father     COLON  . Stroke Neg Hx     1ST DEGREE RELATIVE <60    SOCIAL HISTORY: History   Social History  . Marital Status: Married    Spouse Name: N/A    Number of Children: N/A  . Years of Education: N/A   Occupational History  . RETIRED    Social History Main Topics  . Smoking status: Former Smoker    Quit date: 04/28/1966  . Smokeless tobacco: Never Used  . Alcohol Use: 0.6 oz/week    1 Glasses of wine per week     Comment: 1 small glass with luch and dinner  . Drug Use: No  . Sexual Activity: Not Currently   Other Topics Concern  . Not on file   Social History Narrative  . No narrative on file    REVIEW OF SYSTEMS: Constitutional: No fevers, chills, or sweats, no generalized fatigue, change in appetite Eyes: No visual changes, double vision, eye pain Ear, nose and throat: No hearing loss, ear pain, nasal congestion, sore throat Cardiovascular: No chest pain, palpitations Respiratory:  No shortness of breath at rest or with exertion, wheezes GastrointestinaI: No nausea, vomiting, diarrhea, abdominal pain, fecal  incontinence Genitourinary:  No dysuria, urinary retention or frequency Musculoskeletal:  No neck pain, back pain Integumentary: No rash, pruritus, skin lesions Neurological: as above Psychiatric: No depression, insomnia, anxiety Endocrine: No palpitations, fatigue, diaphoresis, mood swings, change in appetite, change in weight, increased thirst Hematologic/Lymphatic:  No anemia, purpura, petechiae. Allergic/Immunologic: no itchy/runny eyes, nasal congestion, recent allergic reactions, rashes  PHYSICAL EXAM: Filed Vitals:   08/17/13 1025  BP: 104/66  Pulse: 68  Resp: 16   General: No acute distress Head:  Normocephalic/atraumatic Neck: supple, no paraspinal tenderness, full range of motion Heart:  Regular rate and rhythm Lungs:  Clear to auscultation bilaterally Back: No paraspinal tenderness Neurological Exam: alert and oriented to person, place, and time. Attention span and concentration intact, recent and remote memory intact, fund of knowledge intact.  Speech hesitant but not dysarthric, Able to name, repeat, and follow 3 step commands across midline.  Produced 10 words in one minute on naming fluency .  CN II-XII intact. Fundi not visualized.  Bulk and tone normal, muscle strength 5/5 throughout. Vibration sensation reduced in the toes.  Pinprick intact. Deep tendon reflexes 2+ throughout except absent in the ankles, toes downgoing. Finger to nose intact. Gait with normal stride, Romberg negative.  IMPRESSION: TIA Cognitive changes, improved. Depression  PLAN: 1.  Consider increasing Lexapro to 10mg  daily 2.  Xarelto for secondary stroke prevention 3.  May resume driving.  30 minutes spent with patient, over 50% spent counseling and coordinating care.  Follow up in 6 months or as needed.  Metta Clines, DO  CC:  Benay Pillow, MD

## 2013-08-17 NOTE — Patient Instructions (Signed)
1.  Ask Dr. Arnoldo Morale about increasing the Lexapro to 10mg  2.  Continue the Xarelto 3.  I think it would be okay to resume driving. 4.  Follow up in 6 months or sooner if needed.

## 2013-08-18 ENCOUNTER — Encounter: Payer: Self-pay | Admitting: Internal Medicine

## 2013-08-19 MED ORDER — ESCITALOPRAM OXALATE 10 MG PO TABS: 10.0000 mg | ORAL_TABLET | Freq: Every day | ORAL | Status: DC

## 2013-09-14 ENCOUNTER — Telehealth: Payer: Self-pay | Admitting: Internal Medicine

## 2013-09-14 DIAGNOSIS — I482 Chronic atrial fibrillation, unspecified: Secondary | ICD-10-CM

## 2013-09-14 NOTE — Telephone Encounter (Signed)
Theresa [name], [name] is requesting re-fill on digoxin (LANOXIN) 0.125 MG tablet

## 2013-09-15 MED ORDER — DIGOXIN 125 MCG PO TABS: 0.0625 mg | ORAL_TABLET | Freq: Every day | ORAL | Status: DC

## 2013-09-15 NOTE — Telephone Encounter (Signed)
Rx sent to pharmacy   

## 2013-09-30 ENCOUNTER — Ambulatory Visit (INDEPENDENT_AMBULATORY_CARE_PROVIDER_SITE_OTHER): Payer: Medicare Other | Admitting: Physician Assistant

## 2013-09-30 ENCOUNTER — Encounter: Payer: Self-pay | Admitting: Physician Assistant

## 2013-09-30 VITALS — BP 110/74 | HR 66 | Temp 97.9°F | Resp 18 | Wt 104.0 lb

## 2013-09-30 DIAGNOSIS — Z09 Encounter for follow-up examination after completed treatment for conditions other than malignant neoplasm: Secondary | ICD-10-CM

## 2013-09-30 DIAGNOSIS — I4891 Unspecified atrial fibrillation: Secondary | ICD-10-CM

## 2013-09-30 NOTE — Progress Notes (Signed)
Pre visit review using our clinic review tool, if applicable. No additional management support is needed unless otherwise documented below in the visit note. 

## 2013-09-30 NOTE — Progress Notes (Signed)
Subjective:    Patient ID: Theresa [name], female    DOB: 08-May-1931, 80 y.o.   MRN: [id number]  HPI Patient is a 80 y.o. female presenting for follow up. Afib- Pt with hx of CVA in 2013, and TIA in 2014, followed by neurology, Dr. Tomi Likens, and Cardiology, Dr. Johnsie Cancel. On Xarelto for anticoagulation. Pt has been without an event for over 6 months, and is now allowed to drive per Neurology. She has not had ablation. She denies syncope, HA, chest pain, SOB.  Lipids were controlled in January of this year. Stable on Lipitor, she is tolerating this medication well, and denies adverse effects to medication.  No history of DM II, recent insurance home visit requested have A1c drawn at visit today. Pt denies with fevers, chills, nausea, vomiting, diarrhea.    Review of Systems As per HPI and are otherwise negative.    Past Medical History  Diagnosis Date  . Osteoarthritis   . Osteoporosis   . History of postmenopausal HRT   . MVP (mitral valve prolapse)     a. s/p MV repair at [facility] in 2008;  b. Echocardiogram 7/12: EF 50%, status post mitral valve repair with mild MR and minimal MS, mean gradient 4, moderate LAE, LA diam 55 mm; mild RAE, PASP 28-32;   Marland Kitchen Hypothyroidism   . Hx of adenomatous colonic polyps   . Collagenous colitis   . Atrial fibrillation     AFIB  . CVA (cerebral vascular accident)     a.  L parietal CVA by MRI 09/32, likely embolic;  b.  TEE by report with neg bubble study;  c.  carotid dopplers  12/12:  + plaque, no sig ICA stenosis     History   Social History  . Marital Status: Married    Spouse Name: N/A    Number of Children: N/A  . Years of Education: N/A   Occupational History  . RETIRED    Social History Main Topics  . Smoking status: Former Smoker    Quit date: 04/28/1966  . Smokeless tobacco: Never Used  . Alcohol Use: 0.6 oz/week    1 Glasses of wine per week     Comment: 1 small glass with luch and dinner  . Drug Use: No  . Sexual Activity: Not  Currently   Other Topics Concern  . Not on file   Social History Narrative  . No narrative on file    Past Surgical History  Procedure Laterality Date  . Cholecystectomy    . Lumbar fusion    . Tonsillectomy    . Dilation and curettage of uterus    . Cataract extraction    . Mitral valve repair  2008  . Cardioversion  01/09/2012    Procedure: CARDIOVERSION;  Surgeon: Jolaine Artist, MD;  Location: [facility] ENDOSCOPY;  Service: Cardiovascular;  Laterality: N/A;    Family History  Problem Relation Age of Onset  . Cancer Father     COLON  . Stroke Neg Hx     1ST DEGREE RELATIVE <60    Allergies  Allergen Reactions  . Rofecoxib     REACTION: Elevated LFT's    Current Outpatient Prescriptions on File Prior to Visit  Medication Sig Dispense Refill  . Ascorbic Acid (VITAMIN C) 500 MG tablet Take 500 mg by mouth daily.        Marland Kitchen atorvastatin (LIPITOR) 20 MG tablet Take 20 mg by mouth every evening.      Marland Kitchen  Calcium Carbonate-Vitamin D (CALTRATE 600+D) 600-400 MG-UNIT per tablet Take 1 tablet by mouth 2 (two) times daily.        . Cholecalciferol (VITAMIN D) 2000 UNITS CAPS Take 2,000 Units by mouth daily.       . clindamycin (CLEOCIN T) 1 % lotion Apply 1 application topically 2 (two) times daily. Apply to face for rosacea  60 mL  3  . digoxin (LANOXIN) 0.125 MG tablet Take 0.5 tablets (0.0625 mg total) by mouth daily.  30 tablet  3  . escitalopram (LEXAPRO) 10 MG tablet Take 1 tablet (10 mg total) by mouth daily.  30 tablet  11  . etodolac (LODINE) 400 MG tablet Take 400 mg by mouth 2 (two) times daily.      . Folic Acid-Vit Z3-YQM V78 (FOLBEE) 2.5-25-1 MG TABS tablet Take 1 tablet by mouth daily.  30 tablet  11  . glucosamine-chondroitin 500-400 MG tablet Take 1 tablet by mouth 2 (two) times daily.        Marland Kitchen levothyroxine (SYNTHROID, LEVOTHROID) 75 MCG tablet Take 75 mcg by mouth daily before breakfast.      . metoprolol (LOPRESSOR) 100 MG tablet Take 1 tablet (100 mg total) by  mouth 2 (two) times daily.  180 tablet  3  . Multiple Vitamin (MULTIVITAMIN) tablet Take 1 tablet by mouth every evening.       . Multiple Vitamins-Minerals (PRESERVISION AREDS 2 PO) Take 1 tablet by mouth 2 (two) times daily.      . rivaroxaban (XARELTO) 20 MG TABS tablet Take 1 tablet (20 mg total) by mouth daily with supper.  90 tablet  3  . saccharomyces boulardii (FLORASTOR) 250 MG capsule Take 250 mg by mouth daily.       . vitamin A 8000 UNIT capsule Take 8,000 Units by mouth daily.       No current facility-administered medications on file prior to visit.    EXAM: BP 110/74  Pulse 66  Temp(Src) 97.9 F (36.6 C) (Oral)  Resp 18  Wt 104 lb (47.174 kg)     Objective:   Physical Exam  Nursing note and vitals reviewed. Constitutional: She is oriented to person, place, and time. She appears well-developed and well-nourished. No distress.  HENT:  Head: Normocephalic and atraumatic.  Eyes: Conjunctivae and EOM are normal. Pupils are equal, round, and reactive to light.  Neck: Normal range of motion.  Cardiovascular: Intact distal pulses.   Irregular rate and rhythm.   Pulmonary/Chest: Effort normal and breath sounds normal. No respiratory distress. She exhibits no tenderness.  Musculoskeletal: Normal range of motion. She exhibits no edema.  Neurological: She is alert and oriented to person, place, and time.  Skin: Skin is warm and dry. No rash noted. She is not diaphoretic. No erythema. No pallor.  Psychiatric: She has a normal mood and affect. Her behavior is normal. Judgment and thought content normal.      Lab Results  Component Value Date   WBC 6.2 03/21/2013   HGB 13.9 03/21/2013   HCT 41.0 03/21/2013   PLT 156 03/21/2013   GLUCOSE 86 03/21/2013   CHOL 119 03/22/2013   TRIG 44 03/22/2013   HDL 79 03/22/2013   LDLDIRECT 155.5 01/23/2011   LDLCALC 31 03/22/2013   ALT 20 03/21/2013   AST 32 03/21/2013   NA 141 03/21/2013   K 4.2 03/21/2013   CL 102 03/21/2013   CREATININE 1.10 03/21/2013    BUN 27* 03/21/2013   CO2 29 03/21/2013  TSH 1.71 01/10/2013   INR 1.11 03/21/2013   HGBA1C 5.5 03/22/2013        Assessment & Plan:  Theresa [name] was seen today for 3 month follow up.  Diagnoses and associated orders for this visit:  Follow up Comments: Insurance requests updated A1c, last in january, will order. No hx of DM. Also requests Lipids due to CVA hx. - Hemoglobin A1c; Future - Lipid Panel; Future  Atrial fibrillation, unspecified Comments: On xarelto therapy, hx of CVA, TIA. No recent events. Managed by both Cardiology and Neurology. - Hemoglobin A1c; Future - Lipid Panel; Future    Pt will schedule appointment to establish with new PCP prior to leaving.   Pt not fasting, so will schedule appointment today with lab to come in fasting for labs.  Return precautions provided, and patient handout on A-fib.  Plan to follow up as needed, or for worsening or persistent symptoms despite treatment.  Patient Instructions  Schedule an appointment at the front desk to return fasting for a lab appointment for blood work.  You will need to schedule an annual physical, check up front to see what provider you are suppose to establish with.   If emergency symptoms discussed during visit developed, seek medical attention immediately.  Followup as needed, or for worsening or persistent symptoms despite treatment.

## 2013-09-30 NOTE — Patient Instructions (Signed)
Schedule an appointment at the front desk to return fasting for a lab appointment for blood work.  You will need to schedule an annual physical, check up front to see what provider you are suppose to establish with.   If emergency symptoms discussed during visit developed, seek medical attention immediately.  Followup as needed, or for worsening or persistent symptoms despite treatment.    Atrial Fibrillation Atrial fibrillation is a condition that causes your heart to beat irregularly. It may also cause your heart to beat faster than normal. Atrial fibrillation can prevent your heart from pumping blood normally. It increases your risk of stroke and heart problems. HOME CARE  Take medications as told by your doctor.  Only take medications that your doctor says are safe. Some medications can make the condition worse or happen again.  If blood thinners were prescribed by your doctor, take them exactly as told. Too much can cause bleeding. Too little and you will not have the needed protection against stroke and other problems.  Perform blood tests at home if told by your doctor.  Perform blood tests exactly as told by your doctor.  Do not drink alcohol.  Do not drink beverages with caffeine such as coffee, soda, and some teas.  Maintain a healthy weight.  Do not use diet pills unless your doctor says they are safe. They may make heart problems worse.  Follow diet instructions as told by your doctor.  Exercise regularly as told by your doctor.  Keep all follow-up appointments. GET HELP RIGHT AWAY IF:   You have chest or belly (abdominal) pain.  You feel sick to your stomach (nauseous)  You suddenly have swollen feet and ankles.  You feel dizzy.  You face, arms, or legs feel numb or weak.  There is a change in your vision or speech.  You notice a change in the speed, rhythm, or strength of your heartbeat.  You suddenly begin peeing (urinating) more often.  You get  tired more easily when moving or exercising. MAKE SURE YOU:   Understand these instructions.  Will watch your condition.  Will get help right away if you are not doing well or get worse. Document Released: 12/11/2007 Document Revised: 06/28/2012 Document Reviewed: 04/13/2012 [facility] Patient Information 2015 [place], [place]. This information is not intended to replace advice given to you by your health care provider. Make sure you discuss any questions you have with your health care provider.

## 2013-10-03 ENCOUNTER — Other Ambulatory Visit: Payer: Medicare Other

## 2013-10-05 ENCOUNTER — Other Ambulatory Visit (INDEPENDENT_AMBULATORY_CARE_PROVIDER_SITE_OTHER): Payer: Medicare Other

## 2013-10-05 DIAGNOSIS — Z09 Encounter for follow-up examination after completed treatment for conditions other than malignant neoplasm: Secondary | ICD-10-CM

## 2013-10-05 DIAGNOSIS — I4891 Unspecified atrial fibrillation: Secondary | ICD-10-CM

## 2013-10-05 LAB — LIPID PANEL
Cholesterol: 134 mg/dL (ref 0–200)
HDL: 77.2 mg/dL
LDL Cholesterol: 46 mg/dL (ref 0–99)
NonHDL: 56.8
Total CHOL/HDL Ratio: 2
Triglycerides: 52 mg/dL (ref 0.0–149.0)
VLDL: 10.4 mg/dL (ref 0.0–40.0)

## 2013-10-05 LAB — HEMOGLOBIN A1C: Hgb A1c MFr Bld: 5.4 % (ref 4.6–6.5)

## 2013-10-28 ENCOUNTER — Telehealth: Payer: Self-pay | Admitting: Internal Medicine

## 2013-10-28 NOTE — Telephone Encounter (Signed)
Pt request refill of the following: levothyroxine (SYNTHROID, LEVOTHROID) 75 MCG tablet,metoprolol (LOPRESSOR) 100 MG tablet     Phamacy:

## 2013-11-01 MED ORDER — LEVOTHYROXINE SODIUM 75 MCG PO TABS: 75.0000 ug | ORAL_TABLET | Freq: Every day | ORAL | Status: DC

## 2013-11-01 NOTE — Telephone Encounter (Signed)
rx sent in electronically 

## 2013-11-02 ENCOUNTER — Telehealth: Payer: Self-pay | Admitting: Internal Medicine

## 2013-11-02 NOTE — Telephone Encounter (Signed)
[facility]MAIL [facility] - [place], [place] is requesting re-fills on the following: levothyroxine (SYNTHROID, LEVOTHROID) 75 MCG tablet metoprolol (LOPRESSOR) 100 MG tablet

## 2013-11-03 MED ORDER — LEVOTHYROXINE SODIUM 75 MCG PO TABS: 75.0000 ug | ORAL_TABLET | Freq: Every day | ORAL | Status: DC

## 2013-11-03 MED ORDER — METOPROLOL TARTRATE 100 MG PO TABS: 100.0000 mg | ORAL_TABLET | Freq: Two times a day (BID) | ORAL | Status: DC

## 2013-11-03 NOTE — Telephone Encounter (Signed)
rx sent in electronically 

## 2013-11-05 ENCOUNTER — Encounter: Payer: Self-pay | Admitting: Gastroenterology

## 2013-11-08 ENCOUNTER — Other Ambulatory Visit: Payer: Self-pay | Admitting: Internal Medicine

## 2013-11-16 ENCOUNTER — Ambulatory Visit (INDEPENDENT_AMBULATORY_CARE_PROVIDER_SITE_OTHER): Payer: Medicare Other | Admitting: Cardiovascular Disease

## 2013-11-16 ENCOUNTER — Encounter: Payer: Self-pay | Admitting: Cardiovascular Disease

## 2013-11-16 VITALS — BP 114/62 | HR 64 | Ht 62.0 in | Wt 105.0 lb

## 2013-11-16 DIAGNOSIS — I480 Paroxysmal atrial fibrillation: Secondary | ICD-10-CM

## 2013-11-16 DIAGNOSIS — I4891 Unspecified atrial fibrillation: Secondary | ICD-10-CM

## 2013-11-16 DIAGNOSIS — I059 Rheumatic mitral valve disease, unspecified: Secondary | ICD-10-CM

## 2013-11-16 NOTE — Patient Instructions (Signed)
Your physician wants you to follow-up in:  6 MONTHS WITH DR NISHAN  You will receive a reminder letter in the mail two months in advance. If you don't receive a letter, please call our office to schedule the follow-up appointment. Your physician recommends that you continue on your current medications as directed. Please refer to the Current Medication list given to you today. 

## 2013-11-16 NOTE — Progress Notes (Signed)
Patient ID: Theresa [name], female   DOB: 09-25-1931, 80 y.o.   MRN: [id number] She has a history of mitral regurgitation, status post mitral valve repair at [facility] in 2008, paroxysmal atrial fibrillation, prior stroke in 12/12 at [facility] (felt to be embolic), on chronic Coumadin therapy, colitis, Raynaud's. She was seen in the emergency room 11/13/11 with dizziness. Head CT demonstrated atrophy and chronic microvascular ischemic changes, no acute findings. MRI did not show anything acute. EKG demonstrated atrial fibrillation with a heart rate of 122. She did see her PCP in followup who adjusted her Toprol for rate control. Subsequently cardioverted by Dr Harrington Challenger 10/13  Echo 11/26/11  Study Conclusions  - Left ventricle: The cavity size was normal. Wall thickness was normal. The estimated ejection fraction was 50%. Diffuse hypokinesis. - Mitral valve: S/P repair with mild residual MR Mild regurgitation. - Left atrium: The atrium was moderately to severely dilated. - Atrial septum: No defect or patent foramen ovale was identified.  BP stable with beta blocker Seems like she has infrequent PAF Walking 2 miles with husband in mornings      ROS: Denies fever, malais, weight loss, blurry vision, decreased visual acuity, cough, sputum, SOB, hemoptysis, pleuritic pain, palpitaitons, heartburn, abdominal pain, melena, lower extremity edema, claudication, or rash.  All other systems reviewed and negative  General: Affect appropriate Thin frail female HEENT: normal Neck supple with no adenopathy JVP normal no bruits no thyromegaly Lungs clear with no wheezing and good diaphragmatic motion Heart:  S1/S2 no murmur, no rub, gallop or click PMI normal Abdomen: benighn, BS positve, no tenderness, no AAA no bruit.  No HSM or HJR Distal pulses intact with no bruits No edema Neuro non-focal Skin warm and dry No muscular weakness   Current Outpatient Prescriptions  Medication  Sig Dispense Refill  . Ascorbic Acid (VITAMIN C) 500 MG tablet Take 500 mg by mouth daily.        Marland Kitchen atorvastatin (LIPITOR) 20 MG tablet Take 20 mg by mouth every evening.      . Calcium Carbonate-Vitamin D (CALTRATE 600+D) 600-400 MG-UNIT per tablet Take 1 tablet by mouth 2 (two) times daily.        . Cholecalciferol (VITAMIN D) 2000 UNITS CAPS Take 2,000 Units by mouth daily.       . clindamycin (CLEOCIN T) 1 % lotion Apply 1 application topically 2 (two) times daily. Apply to face for rosacea  60 mL  3  . digoxin (LANOXIN) 0.125 MG tablet Take 0.5 tablets (0.0625 mg total) by mouth daily.  30 tablet  3  . escitalopram (LEXAPRO) 10 MG tablet Take 1 tablet (10 mg total) by mouth daily.  30 tablet  11  . etodolac (LODINE) 400 MG tablet Take 400 mg by mouth 2 (two) times daily.      Marland Kitchen FOLBEE 2.5-25-1 MG TABS tablet TAKE ONE TABLET BY MOUTH ONCE DAILY  30 tablet  7  . glucosamine-chondroitin 500-400 MG tablet Take 1 tablet by mouth 2 (two) times daily.        Marland Kitchen levothyroxine (SYNTHROID, LEVOTHROID) 75 MCG tablet Take 1 tablet (75 mcg total) by mouth daily before breakfast.  90 tablet  1  . metoprolol (LOPRESSOR) 100 MG tablet Take 1 tablet (100 mg total) by mouth 2 (two) times daily.  180 tablet  1  . Multiple Vitamin (MULTIVITAMIN) tablet Take 1 tablet by mouth every evening.       . Multiple Vitamins-Minerals (PRESERVISION AREDS 2 PO)  Take 1 tablet by mouth 2 (two) times daily.      Marland Kitchen OVER THE COUNTER MEDICATION Pt uses `herbal form lotion-  camu  Clear- and an herbal pill to help with roseca      . rivaroxaban (XARELTO) 20 MG TABS tablet Take 1 tablet (20 mg total) by mouth daily with supper.  90 tablet  3  . saccharomyces boulardii (FLORASTOR) 250 MG capsule Take 250 mg by mouth daily.       . vitamin A 8000 UNIT capsule Take 8,000 Units by mouth daily.       No current facility-administered medications for this visit.    Allergies  Rofecoxib  Electrocardiogram:  afib rate 75     Assessment and Plan

## 2013-11-16 NOTE — Assessment & Plan Note (Signed)
S/P repair with normal EF and no sig residual EF  SBE

## 2013-11-16 NOTE — Assessment & Plan Note (Signed)
Maint mostly NSR on xarelto no palpitations

## 2013-11-23 ENCOUNTER — Encounter: Payer: Self-pay | Admitting: Family Medicine

## 2013-11-23 ENCOUNTER — Ambulatory Visit (INDEPENDENT_AMBULATORY_CARE_PROVIDER_SITE_OTHER): Payer: Medicare Other | Admitting: Family Medicine

## 2013-11-23 VITALS — BP 140/76 | HR 64 | Temp 97.7°F | Wt 106.0 lb

## 2013-11-23 DIAGNOSIS — F325 Major depressive disorder, single episode, in full remission: Secondary | ICD-10-CM | POA: Insufficient documentation

## 2013-11-23 DIAGNOSIS — L719 Rosacea, unspecified: Secondary | ICD-10-CM | POA: Insufficient documentation

## 2013-11-23 DIAGNOSIS — E039 Hypothyroidism, unspecified: Secondary | ICD-10-CM

## 2013-11-23 DIAGNOSIS — I4891 Unspecified atrial fibrillation: Secondary | ICD-10-CM

## 2013-11-23 DIAGNOSIS — F329 Major depressive disorder, single episode, unspecified: Secondary | ICD-10-CM

## 2013-11-23 DIAGNOSIS — F32A Depression, unspecified: Secondary | ICD-10-CM

## 2013-11-23 DIAGNOSIS — I480 Paroxysmal atrial fibrillation: Secondary | ICD-10-CM

## 2013-11-23 DIAGNOSIS — Z23 Encounter for immunization: Secondary | ICD-10-CM

## 2013-11-23 DIAGNOSIS — F3289 Other specified depressive episodes: Secondary | ICD-10-CM

## 2013-11-23 NOTE — Assessment & Plan Note (Signed)
Continued levothyroxine 75 mcg. Symptomatically well-controlled. Check TSH in 3-4 months

## 2013-11-23 NOTE — Assessment & Plan Note (Signed)
Irritability has been decreased on Lexapro. Continue current medication

## 2013-11-23 NOTE — Patient Instructions (Signed)
Wonderful to meet you today!   Let's check your thyroid in 3-4 months. See me for a visit at that time.   Otherwise let me know if you need anything between now and then.   Thanks, Dr. Yong Channel

## 2013-11-23 NOTE — Progress Notes (Signed)
Theresa Reddish, MD Phone: [phone]  Subjective:  Patient presents today to establish care with me as their new primary care provider. Patient was formerly a patient of Dr. Arnoldo Morale. Chief complaint-noted.   Atrial Fibrillation Doing well on digoxin Xarelto metoprolol.  ROS-no palpitations or chest pain  Hypothyroidism Taking levothyroxine 75 mcg. Last checked in October. ROS-no constipation or recent weight change  Depression States she was much more irritable before she was on Lexapro. She is doing well currently ROS-no SI HI  The following were reviewed and entered/updated in epic: Past Medical History  Diagnosis Date  . Osteoarthritis   . Osteoporosis   . History of postmenopausal HRT   . MVP (mitral valve prolapse)     a. s/p MV repair at [facility] in 2008;  b. Echocardiogram 7/12: EF 50%, status post mitral valve repair with mild MR and minimal MS, mean gradient 4, moderate LAE, LA diam 55 mm; mild RAE, PASP 28-32;   Marland Kitchen Hypothyroidism   . Hx of adenomatous colonic polyps   . Collagenous colitis   . Atrial fibrillation     AFIB  . CVA (cerebral vascular accident)     a.  L parietal CVA by MRI 49/70, likely embolic;  b.  TEE by report with neg bubble study;  c.  carotid dopplers  12/12:  + plaque, no sig ICA stenosis    Patient Active Problem List   Diagnosis Date Noted  . TIA (transient ischemic attack) 03/21/2013    Priority: High  . Hx of ischemic left MCA stroke 04/08/2011    Priority: High  . Atrial fibrillation 07/07/2007    Priority: High  . Mitral valve disorders 09/11/2006    Priority: High  . Depression 11/23/2013    Priority: Medium  . Hypertension 03/04/2012    Priority: Medium  . HYPOTHYROIDISM 11/23/2006    Priority: Medium  . Rosacea 11/23/2013    Priority: Low  . 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 PERSONAL HX COLONIC POLYPS 10/22/2009    Priority: Low  . CHANGE IN BOWELS 09/20/2009    Priority:  Low  . GASTROESOPHAGEAL REFLUX DISEASE, MILD 06/04/2009    Priority: Low  . Raynaud's syndrome 03/06/2009    Priority: Low  . SCOLIOSIS, LUMBAR SPINE 01/03/2009    Priority: Low  . OSTEOARTHRITIS, HANDS, BILATERAL 02/24/2007    Priority: Low  . OSTEOPOROSIS 11/23/2006    Priority: Low  . OSTEOARTHRITIS 09/11/2006    Priority: Low   Past Surgical History  Procedure Laterality Date  . Cholecystectomy    . Lumbar fusion    . Tonsillectomy    . Dilation and curettage of uterus    . Cataract extraction    . Mitral valve repair  2008  . Cardioversion  01/09/2012    Procedure: CARDIOVERSION;  Surgeon: Jolaine Artist, MD;  Location: [facility] ENDOSCOPY;  Service: Cardiovascular;  Laterality: N/A;    Family History  Problem Relation Age of Onset  . Cancer Father     COLON  . Stroke Neg Hx     1ST DEGREE RELATIVE <60    Medications- reviewed and updated Current Outpatient Prescriptions  Medication Sig Dispense Refill  . atorvastatin (LIPITOR) 20 MG tablet Take 20 mg by mouth every evening.      . Calcium Carbonate-Vitamin D (CALTRATE 600+D) 600-400 MG-UNIT per tablet Take 1 tablet by mouth 2 (two) times daily.        . Cholecalciferol (VITAMIN D)  2000 UNITS CAPS Take 2,000 Units by mouth daily.       . clindamycin (CLEOCIN T) 1 % lotion Apply 1 application topically 2 (two) times daily. Apply to face for rosacea  60 mL  3  . digoxin (LANOXIN) 0.125 MG tablet Take 0.5 tablets (0.0625 mg total) by mouth daily.  30 tablet  3  . escitalopram (LEXAPRO) 10 MG tablet Take 1 tablet (10 mg total) by mouth daily.  30 tablet  11  . etodolac (LODINE) 400 MG tablet Take 400 mg by mouth 2 (two) times daily.      Marland Kitchen FOLBEE 2.5-25-1 MG TABS tablet TAKE ONE TABLET BY MOUTH ONCE DAILY  30 tablet  7  . glucosamine-chondroitin 500-400 MG tablet Take 1 tablet by mouth 2 (two) times daily.        Marland Kitchen levothyroxine (SYNTHROID, LEVOTHROID) 75 MCG tablet Take 1 tablet (75 mcg total) by mouth daily before  breakfast.  90 tablet  1  . metoprolol (LOPRESSOR) 100 MG tablet Take 1 tablet (100 mg total) by mouth 2 (two) times daily.  180 tablet  1  . Multiple Vitamin (MULTIVITAMIN) tablet Take 1 tablet by mouth every evening.       . Multiple Vitamins-Minerals (PRESERVISION AREDS 2 PO) Take 1 tablet by mouth 2 (two) times daily.      Marland Kitchen OVER THE COUNTER MEDICATION Pt uses `herbal form lotion-  camu  Clear- and an herbal pill to help with roseca      . rivaroxaban (XARELTO) 20 MG TABS tablet Take 1 tablet (20 mg total) by mouth daily with supper.  90 tablet  3  . saccharomyces boulardii (FLORASTOR) 250 MG capsule Take 250 mg by mouth daily.       . vitamin A 8000 UNIT capsule Take 8,000 Units by mouth daily.      . Ascorbic Acid (VITAMIN C) 500 MG tablet Take 500 mg by mouth daily.         No current facility-administered medications for this visit.    Allergies-reviewed and updated Allergies  Allergen Reactions  . Rofecoxib     REACTION: Elevated LFT's    History   Social History  . Marital Status: Married    Spouse Name: N/A    Number of Children: N/A  . Years of Education: N/A   Occupational History  . RETIRED    Social History Main Topics  . Smoking status: Former Smoker    Quit date: 04/28/1966  . Smokeless tobacco: Never Used  . Alcohol Use: 0.6 oz/week    1 Glasses of wine per week     Comment: 1 small glass with luch and dinner  . Drug Use: No  . Sexual Activity: Not Currently   Other Topics Concern  . None   Social History Narrative   Married for 51 years-1st marriage for both. No kids. No pets.       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 Oropharynx normal Neck: no thyromegaly CV: Irregularly irregular, no murmur, rub or gallop Lungs: CTAB no crackles, wheeze,  rhonchi Abdomen: soft/nontender/nondistended/normal bowel sounds.  Ext: no edema Skin: warm, dry, no rash, no brusing/bleeding Neuro: slow speech, 5/5 strength in grip and with arm flexion  Assessment/Plan:  Atrial fibrillation Rate controlled on metoprolol. Also on low-dose digoxin. Xarelto for anticoagulation. Has had TIAs while on current dose of Xarelto so cannot decreased dose. It had another stroke or TIA perhaps could consider aspirin but thankfully neurology on board to help with decision-making  HYPOTHYROIDISM Continued levothyroxine 75 mcg. Symptomatically well-controlled. Check TSH in 3-4 months  Depression Irritability has been decreased on Lexapro. Continue current medication

## 2013-11-23 NOTE — Assessment & Plan Note (Signed)
Rate controlled on metoprolol. Also on low-dose digoxin. Xarelto for anticoagulation. Has had TIAs while on current dose of Xarelto so cannot decreased dose. It had another stroke or TIA perhaps could consider aspirin but thankfully neurology on board to help with decision-making

## 2013-11-24 ENCOUNTER — Telehealth: Payer: Self-pay | Admitting: Family Medicine

## 2013-11-24 DIAGNOSIS — E039 Hypothyroidism, unspecified: Secondary | ICD-10-CM

## 2013-11-24 NOTE — Telephone Encounter (Signed)
Pt said Dr Yong Channel want to check her thyroid  In 3 months if so there are no orders in for lab work .

## 2013-11-24 NOTE — Telephone Encounter (Signed)
The order is in

## 2013-11-29 NOTE — Telephone Encounter (Signed)
lmovm for pt to call and schedule lab for tsh

## 2013-11-30 NOTE — Telephone Encounter (Signed)
Lab scheduled °

## 2013-12-20 ENCOUNTER — Telehealth: Payer: Self-pay | Admitting: Family Medicine

## 2013-12-20 MED ORDER — ETODOLAC 400 MG PO TABS: 400.0000 mg | ORAL_TABLET | Freq: Two times a day (BID) | ORAL | Status: DC

## 2013-12-20 MED ORDER — ATORVASTATIN CALCIUM 20 MG PO TABS: 20.0000 mg | ORAL_TABLET | Freq: Every evening | ORAL | Status: DC

## 2013-12-20 NOTE — Telephone Encounter (Signed)
[facility] (MAIL ORDER) [facility] - [place], [place] is requesting 90 day re-fill on etodolac (LODINE) 400 MG tablet and atorvastatin (LIPITOR) 20 MG tablet

## 2013-12-20 NOTE — Telephone Encounter (Signed)
Medication refilled

## 2014-02-07 ENCOUNTER — Encounter: Payer: Self-pay | Admitting: Family Medicine

## 2014-02-07 ENCOUNTER — Telehealth: Payer: Self-pay

## 2014-02-07 DIAGNOSIS — Z0100 Encounter for examination of eyes and vision without abnormal findings: Secondary | ICD-10-CM

## 2014-02-07 NOTE — Telephone Encounter (Signed)
error 

## 2014-02-15 ENCOUNTER — Encounter: Payer: Self-pay | Admitting: Neurology

## 2014-02-15 ENCOUNTER — Ambulatory Visit (INDEPENDENT_AMBULATORY_CARE_PROVIDER_SITE_OTHER): Payer: Medicare Other | Admitting: Neurology

## 2014-02-15 VITALS — BP 104/66 | HR 66 | Temp 97.4°F | Resp 16 | Ht 63.0 in | Wt 108.6 lb

## 2014-02-15 DIAGNOSIS — R413 Other amnesia: Secondary | ICD-10-CM

## 2014-02-15 DIAGNOSIS — F329 Major depressive disorder, single episode, unspecified: Secondary | ICD-10-CM

## 2014-02-15 DIAGNOSIS — F32A Depression, unspecified: Secondary | ICD-10-CM

## 2014-02-15 DIAGNOSIS — Z8673 Personal history of transient ischemic attack (TIA), and cerebral infarction without residual deficits: Secondary | ICD-10-CM

## 2014-02-15 NOTE — Patient Instructions (Signed)
1.  Continue Xarelto and Lexapro 2.  Memory seems okay on today's exam.  Continue to monitor 3.  Follow up in one year for e-evaluation (including memory testing, MOCA)

## 2014-02-15 NOTE — Progress Notes (Signed)
NEUROLOGY FOLLOW UP OFFICE NOTE  DODI LEU [id number]  HISTORY OF PRESENT ILLNESS: Theresa [name] is an 80 year old right-hand woman with ophthalmic migraines, paroxysmal atrial fibrillation (on Xarelto), MVP and history of stroke and TIA who follows up for episode of loss of awareness and cognitive deficits.  She is accompanied by her husband.  Records personally reviewed.    UPDATE: She is doing well.  No recurrent spells..  Still has some problems with speech in terms of getting words out, as well as trace weakness in the right hand.  Otherwise, her husband is still concerned about memory.  For example, she and her husband recently saw a movie.  Her husband insisted they saw the movie before, but she does not recall.  Otherwise, she does not forget conversations, repeat questions, or get disoriented.  She has decided to continue not driving.  She feels depression is under better control since increasing the Lexapro.  HISTORY: In October 2014, while on a riverboat cruise in [place], she had a transient episode of loss of consciousness.  They were sitting in the lounge and she was looking at the newsletter.  She began nodding her head and she lost consciousness.  She remained in the chair, stiff and dropped the newsletter.  Her husband went to get help.  It lasted about 5 minutes and she woke up.  She was not confused when she woke up.  She did not have any convulsions, repetitive movements, urinary incontinence or tongue biting.  The next morning, she woke up and was confused.  She knew what words she wanted to say but had difficulty how to use them.  She understood her husband.  She was not amnestic to the event.  She thought she was still at home and didn't know they were already on the trip.  Once she and her husband left their cabin, she was fully oriented.  The following morning, she woke up early and was talking about taking souvenirs back home.  She fell back to sleep and later in the  morning she was fine.  The EMS on the boat discussed sending her out to a hospital but felt that she was not in any danger at this point.  Since getting home, she has had difficulty performing simple daily tasks.  She has had difficulty operating the microwave, stove and the telephone.  She has also had more difficulty with direction in the car.  She currently does not drive.  She has had some cognitive difficulty since her stroke, but seems more pronounced since this last episode. Due to residual numbness in right hand from the stroke, she has some trouble operating the TV remote.  Since last visit, there has been some improvement in performing these tasks.  She does not have any focal weakness.  She notes more difficulty with word-finding, particularly when talking with other people, especially people she doesn't know well.  She feels self-conscious.   She has not had any more spells.  She sustained a left parietal ischemic stroke presenting as aphasia, right facial weakness and right hand numbness in December 2012, likely cardio-embolic.  She has subsequently been on anticoagulation and currently is on Xarelto.    Since the stroke, she would have periods of mild disorientation.  It is not too bad.  Usually, when she is riding with her husband, she is not sure if they should make a left or right to get to their destination.  This is something she  used to be sharp about.  MRI of brain performed 01/22/13 revealed stable remote hemorrhagic infarct in posterior left frontal lobe and stable remote lacunar infarcts in cerebellum, but no acute stroke.  She was admitted to [facility] on 03/21/13 for left upper and lower extremity weakness and slurred speech.  She was not a tPA candidate due to use of Xarelto.  MRI of the brain revealed no acute infarct.  2D echo revealed normal EF of 55%.  Carotid doppler revealed no hemodynamically significant stenosis.  INR was 1.11, Hgb A1c 5.5 and LDL 31.  She endorsed compliance  with Xarelto and was instructed to start taking with supper for better absorption.  Digoxin was resumed and Lopressor was lowered due to bradycardia.  PAST MEDICAL HISTORY: Past Medical History  Diagnosis Date  . Osteoarthritis   . Osteoporosis   . History of postmenopausal HRT   . MVP (mitral valve prolapse)     a. s/p MV repair at [facility] in 2008;  b. Echocardiogram 7/12: EF 50%, status post mitral valve repair with mild MR and minimal MS, mean gradient 4, moderate LAE, LA diam 55 mm; mild RAE, PASP 28-32;   Marland Kitchen Hypothyroidism   . Hx of adenomatous colonic polyps   . Collagenous colitis   . Atrial fibrillation     AFIB  . CVA (cerebral vascular accident)     a.  L parietal CVA by MRI 16/07, likely embolic;  b.  TEE by report with neg bubble study;  c.  carotid dopplers  12/12:  + plaque, no sig ICA stenosis     MEDICATIONS: Current Outpatient Prescriptions on File Prior to Visit  Medication Sig Dispense Refill  . Ascorbic Acid (VITAMIN C) 500 MG tablet Take 500 mg by mouth daily.      Marland Kitchen atorvastatin (LIPITOR) 20 MG tablet Take 1 tablet (20 mg total) by mouth every evening. 90 tablet 0  . Calcium Carbonate-Vitamin D (CALTRATE 600+D) 600-400 MG-UNIT per tablet Take 1 tablet by mouth 2 (two) times daily.      . Cholecalciferol (VITAMIN D) 2000 UNITS CAPS Take 2,000 Units by mouth daily.     . clindamycin (CLEOCIN T) 1 % lotion Apply 1 application topically 2 (two) times daily. Apply to face for rosacea 60 mL 3  . digoxin (LANOXIN) 0.125 MG tablet Take 0.5 tablets (0.0625 mg total) by mouth daily. 30 tablet 3  . escitalopram (LEXAPRO) 10 MG tablet Take 1 tablet (10 mg total) by mouth daily. 30 tablet 11  . etodolac (LODINE) 400 MG tablet Take 1 tablet (400 mg total) by mouth 2 (two) times daily. 90 tablet 0  . FOLBEE 2.5-25-1 MG TABS tablet TAKE ONE TABLET BY MOUTH ONCE DAILY 30 tablet 7  . glucosamine-chondroitin 500-400 MG tablet Take 1 tablet by mouth 2 (two) times daily.      Marland Kitchen  levothyroxine (SYNTHROID, LEVOTHROID) 75 MCG tablet Take 1 tablet (75 mcg total) by mouth daily before breakfast. 90 tablet 1  . metoprolol (LOPRESSOR) 100 MG tablet Take 1 tablet (100 mg total) by mouth 2 (two) times daily. 180 tablet 1  . Multiple Vitamin (MULTIVITAMIN) tablet Take 1 tablet by mouth every evening.     . Multiple Vitamins-Minerals (PRESERVISION AREDS 2 PO) Take 1 tablet by mouth 2 (two) times daily.    Marland Kitchen OVER THE COUNTER MEDICATION Pt uses `herbal form lotion-  camu  Clear- and an herbal pill to help with roseca    . rivaroxaban (XARELTO) 20 MG  TABS tablet Take 1 tablet (20 mg total) by mouth daily with supper. 90 tablet 3  . saccharomyces boulardii (FLORASTOR) 250 MG capsule Take 250 mg by mouth daily.     . vitamin A 8000 UNIT capsule Take 8,000 Units by mouth daily.     No current facility-administered medications on file prior to visit.    ALLERGIES: Allergies  Allergen Reactions  . Rofecoxib     REACTION: Elevated LFT's    FAMILY HISTORY: Family History  Problem Relation Age of Onset  . Cancer Father     COLON  . Stroke Neg Hx     1ST DEGREE RELATIVE <60    SOCIAL HISTORY: History   Social History  . Marital Status: Married    Spouse Name: N/A    Number of Children: N/A  . Years of Education: N/A   Occupational History  . RETIRED    Social History Main Topics  . Smoking status: Former Smoker    Quit date: 04/28/1966  . Smokeless tobacco: Never Used  . Alcohol Use: 0.6 oz/week    1 Glasses of wine per week     Comment: 1 small glass with luch and dinner  . Drug Use: No  . Sexual Activity: Not Currently   Other Topics Concern  . Not on file   Social History Narrative   Married for 47 years-1st marriage for both. No kids. No pets.       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   Lived in [place] for 10 years prior (masters in music and music education)-undergrad at [employer]: eating out, opera in [place]: Constitutional: No fevers, chills, or sweats, no generalized fatigue, change in appetite Eyes: No visual changes, double vision, eye pain Ear, nose and throat: No hearing loss, ear pain, nasal congestion, sore throat Cardiovascular: No chest pain, palpitations Respiratory:  No shortness of breath at rest or with exertion, wheezes GastrointestinaI: No nausea, vomiting, diarrhea, abdominal pain, fecal incontinence Genitourinary:  No dysuria, urinary retention or frequency Musculoskeletal:  No neck pain, back pain Integumentary: No rash, pruritus, skin lesions Neurological: as above Psychiatric: No depression, insomnia, anxiety Endocrine: No palpitations, fatigue, diaphoresis, mood swings, change in appetite, change in weight, increased thirst Hematologic/Lymphatic:  No anemia, purpura, petechiae. Allergic/Immunologic: no itchy/runny eyes, nasal congestion, recent allergic reactions, rashes  PHYSICAL EXAM: Filed Vitals:   02/15/14 1030  BP: 104/66  Pulse: 66  Temp: 97.4 F (36.3 C)  Resp: 16   General: No acute distress Head:  Normocephalic/atraumatic Eyes:  Fundoscopic exam unremarkable without vessel changes, exudates, hemorrhages or papilledema. Neck: supple, no paraspinal tenderness, full range of motion Heart:  Regular rate and rhythm Lungs:  Clear to auscultation bilaterally Back: No paraspinal tenderness Neurological Exam: alert and oriented to person, place, and time. Attention span and concentration intact, recent and remote memory intact, fund of knowledge intact.  Speech occasionally hesitant but overall fluent and not dysarthric, language intact.   MMSE - Mini Mental State Exam 02/15/2014  Orientation to time 5  Orientation to Place 5  Registration 3  Attention/ Calculation 3  Recall 3  Language- name 2 objects 2  Language- repeat 1  Language- follow 3 step command 3  Language- read & follow direction 1  Write a sentence 1  Copy design 1    Total score 28   CN II-XII intact. Fundoscopic exam  unremarkable without vessel changes, exudates, hemorrhages or papilledema.  Bulk and tone normal, muscle strength 5/5 throughout.  Sensation to light touch, temperature and vibration intact.  Deep tendon reflexes 2+ throughout except absent at the ankles.  Finger to nose and heel to shin testing intact.  Gait normal.  IMPRESSION: History of TIA Depression, stable Memory problems, minimal  PLAN: 1.  Continue Xarelto and Lexapro 2.  Memory seems okay on today's exam.  Continue to monitor 3.  Follow up in one year for e-evaluation (including memory testing, MOCA)  15 minutes spent with patient, over 50% spent coordinating care  Theresa Clines, DO  CC: Garret Reddish, MD

## 2014-02-23 ENCOUNTER — Other Ambulatory Visit (INDEPENDENT_AMBULATORY_CARE_PROVIDER_SITE_OTHER): Payer: Medicare Other

## 2014-02-23 DIAGNOSIS — E039 Hypothyroidism, unspecified: Secondary | ICD-10-CM

## 2014-02-23 LAB — TSH: TSH: 1.01 u[IU]/mL (ref 0.35–4.50)

## 2014-03-02 ENCOUNTER — Encounter: Payer: Self-pay | Admitting: Family Medicine

## 2014-03-02 ENCOUNTER — Ambulatory Visit (INDEPENDENT_AMBULATORY_CARE_PROVIDER_SITE_OTHER): Payer: Medicare Other | Admitting: Family Medicine

## 2014-03-02 VITALS — BP 150/90 | Temp 97.9°F | Wt 108.0 lb

## 2014-03-02 DIAGNOSIS — M19042 Primary osteoarthritis, left hand: Secondary | ICD-10-CM

## 2014-03-02 DIAGNOSIS — N183 Chronic kidney disease, stage 3 unspecified: Secondary | ICD-10-CM | POA: Insufficient documentation

## 2014-03-02 DIAGNOSIS — Z23 Encounter for immunization: Secondary | ICD-10-CM

## 2014-03-02 DIAGNOSIS — E038 Other specified hypothyroidism: Secondary | ICD-10-CM

## 2014-03-02 DIAGNOSIS — N182 Chronic kidney disease, stage 2 (mild): Secondary | ICD-10-CM

## 2014-03-02 DIAGNOSIS — E034 Atrophy of thyroid (acquired): Secondary | ICD-10-CM

## 2014-03-02 DIAGNOSIS — M19041 Primary osteoarthritis, right hand: Secondary | ICD-10-CM

## 2014-03-02 NOTE — Patient Instructions (Signed)
Prevnar today.   Thyroid looks great.   Still in atrial fibrillation  Glad you got a good report from neurology.   Continue etodolac for the arthritis in your hands

## 2014-03-02 NOTE — Progress Notes (Signed)
Garret Reddish, MD Phone: [phone]  Subjective:   Theresa [name] is a 80 y.o. year old very pleasant female patient who presents with the following:  Hypothyroidism-well controlled On thyroid medication-levothyroxine 61mcg ROS-No hair or nail changes. No heat/cold intolerance except feet get cold due to raynauds. No constipation or diarrhea- twice a day bowel movement. Denies shakiness or anxiety. Some aches and pains perceived to be age related.   Osteoarthritis of the fingers-controlled She has swollen PIP joints on all digits. She takes etodolac 400 mg twice a day. No recent worsening in her pain. ROS-no erythematous or warm joints  Past Medical History- Patient Active Problem List   Diagnosis Date Noted  . TIA (transient ischemic attack) 03/21/2013    Priority: High  . Hx of ischemic left MCA stroke 04/08/2011    Priority: High  . Atrial fibrillation 07/07/2007    Priority: High  . Mitral valve disorders 09/11/2006    Priority: High  . CKD (chronic kidney disease), stage II 03/02/2014    Priority: Medium  . Depression 11/23/2013    Priority: Medium  . Hypertension 03/04/2012    Priority: Medium  . Hypothyroidism 11/23/2006    Priority: Medium  . OSTEOPOROSIS 11/23/2006    Priority: Medium  . Rosacea 11/23/2013    Priority: Low  . 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 PERSONAL HX COLONIC POLYPS 10/22/2009    Priority: Low  . CHANGE IN BOWELS 09/20/2009    Priority: Low  . GASTROESOPHAGEAL REFLUX DISEASE, MILD 06/04/2009    Priority: Low  . Raynaud's syndrome 03/06/2009    Priority: Low  . SCOLIOSIS, LUMBAR SPINE 01/03/2009    Priority: Low  . Osteoarthritis 09/11/2006    Priority: Low   Medications- reviewed and updated Current Outpatient Prescriptions  Medication Sig Dispense Refill  . Ascorbic Acid (VITAMIN C) 500 MG tablet Take 500 mg by mouth daily.      Marland Kitchen atorvastatin (LIPITOR) 20 MG tablet Take 1  tablet (20 mg total) by mouth every evening. 90 tablet 0  . Calcium Carbonate-Vitamin D (CALTRATE 600+D) 600-400 MG-UNIT per tablet Take 1 tablet by mouth 2 (two) times daily.      . Cholecalciferol (VITAMIN D) 2000 UNITS CAPS Take 2,000 Units by mouth daily.     . clindamycin (CLEOCIN T) 1 % lotion Apply 1 application topically 2 (two) times daily. Apply to face for rosacea 60 mL 3  . digoxin (LANOXIN) 0.125 MG tablet Take 0.5 tablets (0.0625 mg total) by mouth daily. 30 tablet 3  . escitalopram (LEXAPRO) 10 MG tablet Take 1 tablet (10 mg total) by mouth daily. 30 tablet 11  . etodolac (LODINE) 400 MG tablet Take 1 tablet (400 mg total) by mouth 2 (two) times daily. 90 tablet 0  . FOLBEE 2.5-25-1 MG TABS tablet TAKE ONE TABLET BY MOUTH ONCE DAILY 30 tablet 7  . glucosamine-chondroitin 500-400 MG tablet Take 1 tablet by mouth 2 (two) times daily.      Marland Kitchen levothyroxine (SYNTHROID, LEVOTHROID) 75 MCG tablet Take 1 tablet (75 mcg total) by mouth daily before breakfast. 90 tablet 1  . metoprolol (LOPRESSOR) 100 MG tablet Take 1 tablet (100 mg total) by mouth 2 (two) times daily. 180 tablet 1  . Multiple Vitamin (MULTIVITAMIN) tablet Take 1 tablet by mouth every evening.     . Multiple Vitamins-Minerals (PRESERVISION AREDS 2 PO) Take 1 tablet by mouth 2 (two) times daily.    Marland Kitchen OVER  THE COUNTER MEDICATION Pt uses `herbal form lotion-  camu  Clear- and an herbal pill to help with roseca    . rivaroxaban (XARELTO) 20 MG TABS tablet Take 1 tablet (20 mg total) by mouth daily with supper. 90 tablet 3  . saccharomyces boulardii (FLORASTOR) 250 MG capsule Take 250 mg by mouth daily.     . vitamin A 8000 UNIT capsule Take 8,000 Units by mouth daily.     No current facility-administered medications for this visit.    Objective: BP 150/90 mmHg  Temp(Src) 97.9 F (36.6 C)  Wt 108 lb (48.988 kg) Gen: NAD, resting comfortably in chair, thin No thyromegaly CV: irregularly irregular, no mrg Lungs: CTAB no  crackles, wheeze, rhonchi Abdomen: soft/nontender/nondistended/normal bowel sounds.  Ext: no edema Skin: warm, dry, no rash MSK: all PIP joints on bilatteral hands swollen but no tenderness to touch Neuro: reduced sensation on fingers 1 and 2 on right hand from prior stroke   Assessment/Plan:  Hypothyroidism Well-controlled on levothyroxine 75 mcg. Make sure to repeat every 6-12 months  Osteoarthritis Well-controlled on etodolac 400 mg twice a day. Need to monitor her GFR which hangs around 60-70. I would say she progressed below 55 we may need to consider tramadol as an alternative   Return precautions advised. Follow up BP next visit-2 weeks ago BP <110/80 and increased today. Will monitor for now.   Orders Placed This Encounter  Procedures  . Pneumococcal conjugate vaccine 13-valent

## 2014-03-02 NOTE — Assessment & Plan Note (Signed)
Well-controlled on etodolac 400 mg twice a day. Need to monitor her GFR which hangs around 60-70. I would say she progressed below 55 we may need to consider tramadol as an alternative

## 2014-03-02 NOTE — Assessment & Plan Note (Signed)
Well-controlled on levothyroxine 75 mcg. Make sure to repeat every 6-12 months

## 2014-03-03 LAB — HM MAMMOGRAPHY: HM Mammogram: NORMAL

## 2014-03-06 ENCOUNTER — Encounter: Payer: Self-pay | Admitting: Family Medicine

## 2014-03-20 ENCOUNTER — Other Ambulatory Visit: Payer: Medicare Other

## 2014-03-27 ENCOUNTER — Ambulatory Visit: Payer: Medicare Other | Admitting: Family Medicine

## 2014-04-21 ENCOUNTER — Telehealth: Payer: Self-pay | Admitting: Family Medicine

## 2014-04-21 MED ORDER — ETODOLAC 400 MG PO TABS: 400.0000 mg | ORAL_TABLET | Freq: Two times a day (BID) | ORAL | Status: DC

## 2014-04-25 MED ORDER — ETODOLAC 400 MG PO TABS: ORAL_TABLET | ORAL | Status: DC

## 2014-04-25 NOTE — Telephone Encounter (Signed)
Pt needs her original rx for etodolac (LODINE) 400 MG tablet sent [facility]. They never received a new request and that is why pt is out. But pt take 2 /BID. So [facility] we [facility] was only a 15 days supply;   Pt has appt in june

## 2014-04-26 NOTE — Telephone Encounter (Signed)
Medication sent in. 

## 2014-05-08 ENCOUNTER — Other Ambulatory Visit: Payer: Self-pay | Admitting: Family Medicine

## 2014-05-08 MED ORDER — METOPROLOL TARTRATE 100 MG PO TABS: 100.0000 mg | ORAL_TABLET | Freq: Two times a day (BID) | ORAL | Status: DC

## 2014-05-08 MED ORDER — ATORVASTATIN CALCIUM 20 MG PO TABS: 20.0000 mg | ORAL_TABLET | Freq: Every evening | ORAL | Status: DC

## 2014-05-14 NOTE — Progress Notes (Signed)
Patient ID: Theresa [name], female   DOB: 05-03-1931, 81 y.o.   MRN: [id number] 81 y.o.  has a history of mitral regurgitation, status post mitral valve repair at [facility] in 2008, paroxysmal atrial fibrillation, prior stroke in 12/12 at [facility] (felt to be embolic), on chronic Coumadin therapy, colitis, Raynaud's. She was seen in the emergency room 11/13/11 with dizziness. Head CT demonstrated atrophy and chronic microvascular ischemic changes, no acute findings. MRI did not show anything acute. EKG demonstrated atrial fibrillation with a heart rate of 122. She did see her PCP in followup who adjusted her Toprol for rate control. Subsequently cardioverted by Dr Harrington Challenger 10/13  Echo 11/26/11  Study Conclusions  - Left ventricle: The cavity size was normal. Wall thickness was normal. The estimated ejection fraction was 50%. Diffuse hypokinesis. - Mitral valve: S/P repair with mild residual MR Mild regurgitation. - Left atrium: The atrium was moderately to severely dilated. - Atrial septum: No defect or patent foramen ovale was identified.  BP stable with beta blocker Seems like she has infrequent PAF Walking 2 miles with husband in mornings No bleeding issues   ROS: Denies fever, malais, weight loss, blurry vision, decreased visual acuity, cough, sputum, SOB, hemoptysis, pleuritic pain, palpitaitons, heartburn, abdominal pain, melena, lower extremity edema, claudication, or rash.  All other systems reviewed and negative  General: Affect appropriate Thin frail female HEENT: normal Neck supple with no adenopathy JVP normal no bruits no thyromegaly Lungs clear with no wheezing and good diaphragmatic motion Heart:  S1/S2 no murmur, no rub, gallop or click PMI normal Abdomen: benighn, BS positve, no tenderness, no AAA no bruit.  No HSM or HJR Distal pulses intact with no bruits No edema Neuro non-focal Skin warm and dry No muscular weakness   Current Outpatient  Prescriptions  Medication Sig Dispense Refill  . Ascorbic Acid (VITAMIN C) 500 MG tablet Take 500 mg by mouth daily.      Marland Kitchen atorvastatin (LIPITOR) 20 MG tablet Take 1 tablet (20 mg total) by mouth every evening. 90 tablet 0  . Calcium Carbonate-Vitamin D (CALTRATE 600+D) 600-400 MG-UNIT per tablet Take 1 tablet by mouth 2 (two) times daily.      . Cholecalciferol (VITAMIN D) 2000 UNITS CAPS Take 2,000 Units by mouth daily.     . clindamycin (CLEOCIN T) 1 % lotion Apply 1 application topically 2 (two) times daily. Apply to face for rosacea 60 mL 3  . digoxin (LANOXIN) 0.125 MG tablet Take 0.5 tablets (0.0625 mg total) by mouth daily. 30 tablet 3  . escitalopram (LEXAPRO) 10 MG tablet Take 1 tablet (10 mg total) by mouth daily. 30 tablet 11  . etodolac (LODINE) 400 MG tablet Take two tablets by mouth 2 times daily. 120 tablet 2  . FOLBEE 2.5-25-1 MG TABS tablet TAKE ONE TABLET BY MOUTH ONCE DAILY 30 tablet 7  . glucosamine-chondroitin 500-400 MG tablet Take 1 tablet by mouth 2 (two) times daily.      Marland Kitchen levothyroxine (SYNTHROID, LEVOTHROID) 75 MCG tablet Take 1 tablet (75 mcg total) by mouth daily before breakfast. 90 tablet 1  . metoprolol (LOPRESSOR) 100 MG tablet Take 1 tablet (100 mg total) by mouth 2 (two) times daily. 180 tablet 1  . Multiple Vitamin (MULTIVITAMIN) tablet Take 1 tablet by mouth every evening.     . Multiple Vitamins-Minerals (PRESERVISION AREDS 2 PO) Take 1 tablet by mouth 2 (two) times daily.    Marland Kitchen OVER THE COUNTER MEDICATION Pt uses `herbal  form lotion-  camu  Clear- and an herbal pill to help with roseca    . rivaroxaban (XARELTO) 20 MG TABS tablet Take 1 tablet (20 mg total) by mouth daily with supper. 90 tablet 3  . saccharomyces boulardii (FLORASTOR) 250 MG capsule Take 250 mg by mouth daily.     . vitamin A 8000 UNIT capsule Take 8,000 Units by mouth daily.     No current facility-administered medications for this visit.     Allergies  Rofecoxib  Electrocardiogram:  afib rate 75  11/16/13  Assessment and Plan

## 2014-05-15 ENCOUNTER — Ambulatory Visit: Payer: Medicare Other | Admitting: Cardiovascular Disease

## 2014-05-15 ENCOUNTER — Encounter: Payer: Self-pay | Admitting: Cardiovascular Disease

## 2014-05-15 ENCOUNTER — Ambulatory Visit (INDEPENDENT_AMBULATORY_CARE_PROVIDER_SITE_OTHER): Payer: Medicare Other | Admitting: Cardiovascular Disease

## 2014-05-15 VITALS — BP 122/80 | HR 69 | Ht 61.0 in | Wt 107.2 lb

## 2014-05-15 DIAGNOSIS — I10 Essential (primary) hypertension: Secondary | ICD-10-CM

## 2014-05-15 DIAGNOSIS — I4891 Unspecified atrial fibrillation: Secondary | ICD-10-CM

## 2014-05-15 NOTE — Assessment & Plan Note (Signed)
SR today  No afib continue current meds and xarelto  Had CVA post MV surgery may need lovenox bridging in future if anticoagulation needs to be held

## 2014-05-15 NOTE — Patient Instructions (Signed)
Your physician wants you to follow-up in:  6 MONTHS WITH DR NISHAN  You will receive a reminder letter in the mail two months in advance. If you don't receive a letter, please call our office to schedule the follow-up appointment. Your physician recommends that you continue on your current medications as directed. Please refer to the Current Medication list given to you today. 

## 2014-05-15 NOTE — Assessment & Plan Note (Signed)
Well controlled.  Continue current medications and low sodium Dash type diet.    

## 2014-05-15 NOTE — Assessment & Plan Note (Signed)
MV repair 2005 [facility] no residual MR  EF normal SBE prophylaxis

## 2014-05-16 ENCOUNTER — Other Ambulatory Visit: Payer: Self-pay | Admitting: Family Medicine

## 2014-05-16 DIAGNOSIS — I482 Chronic atrial fibrillation, unspecified: Secondary | ICD-10-CM

## 2014-05-16 NOTE — Telephone Encounter (Signed)
Pt rx request for lanoxin 0.125mg  tab

## 2014-05-17 MED ORDER — DIGOXIN 125 MCG PO TABS: 0.0625 mg | ORAL_TABLET | Freq: Every day | ORAL | Status: DC

## 2014-05-17 NOTE — Telephone Encounter (Signed)
rx sent in electronically 

## 2014-06-13 ENCOUNTER — Other Ambulatory Visit: Payer: Self-pay | Admitting: Family Medicine

## 2014-06-13 MED ORDER — LEVOTHYROXINE SODIUM 75 MCG PO TABS: 75.0000 ug | ORAL_TABLET | Freq: Every day | ORAL | Status: DC

## 2014-06-13 NOTE — Telephone Encounter (Signed)
Refill request for Levothyroxin 75 mcg take 1 po qd and send to [facility], also a 90 day supply. I did send script [facility].

## 2014-07-03 ENCOUNTER — Telehealth: Payer: Self-pay | Admitting: Family Medicine

## 2014-07-03 ENCOUNTER — Other Ambulatory Visit: Payer: Self-pay

## 2014-07-03 MED ORDER — ETODOLAC 400 MG PO TABS: ORAL_TABLET | ORAL | Status: DC

## 2014-07-03 NOTE — Telephone Encounter (Signed)
Medication refilled

## 2014-07-03 NOTE — Telephone Encounter (Signed)
Pt said she  Takes etodolac 400 mg one tablet twice a day and not 2 tablets 4 times a day. Pt send in correct rx [facility] for 90 day supply w/refills

## 2014-07-11 ENCOUNTER — Other Ambulatory Visit: Payer: Self-pay | Admitting: *Deleted

## 2014-07-11 MED ORDER — FOLIC ACID-VIT B6-VIT B12 2.5-25-1 MG PO TABS: 1.0000 | ORAL_TABLET | Freq: Every day | ORAL | Status: DC

## 2014-07-21 ENCOUNTER — Telehealth: Payer: Self-pay

## 2014-07-21 DIAGNOSIS — I4891 Unspecified atrial fibrillation: Secondary | ICD-10-CM

## 2014-07-21 DIAGNOSIS — I639 Cerebral infarction, unspecified: Secondary | ICD-10-CM

## 2014-07-21 MED ORDER — RIVAROXABAN 20 MG PO TABS: 20.0000 mg | ORAL_TABLET | Freq: Every day | ORAL | Status: DC

## 2014-07-21 NOTE — Telephone Encounter (Signed)
[facility] refill request for rivaroxaban (XARELTO) 20 MG TABS tablet

## 2014-07-21 NOTE — Telephone Encounter (Signed)
Medication refilled through [facility]

## 2014-08-01 ENCOUNTER — Telehealth: Payer: Self-pay | Admitting: Family Medicine

## 2014-08-01 MED ORDER — ATORVASTATIN CALCIUM 20 MG PO TABS: 20.0000 mg | ORAL_TABLET | Freq: Every evening | ORAL | Status: DC

## 2014-08-01 NOTE — Telephone Encounter (Signed)
Medication refilled

## 2014-08-01 NOTE — Telephone Encounter (Signed)
Pt needs refill on atorvastatin 20 mg #90 w/refills send to [facility]

## 2014-08-18 ENCOUNTER — Telehealth: Payer: Self-pay | Admitting: Family Medicine

## 2014-08-18 MED ORDER — ESCITALOPRAM OXALATE 10 MG PO TABS: 10.0000 mg | ORAL_TABLET | Freq: Every day | ORAL | Status: DC

## 2014-08-18 NOTE — Telephone Encounter (Signed)
Pt needs refill on generic lexapro 10 mg #30 call into [facility]. Pt has about 2 pills left

## 2014-08-18 NOTE — Telephone Encounter (Signed)
Refill sent in

## 2014-09-01 ENCOUNTER — Encounter: Payer: Self-pay | Admitting: Family Medicine

## 2014-09-01 ENCOUNTER — Ambulatory Visit (INDEPENDENT_AMBULATORY_CARE_PROVIDER_SITE_OTHER): Payer: Medicare Other | Admitting: Family Medicine

## 2014-09-01 ENCOUNTER — Ambulatory Visit: Payer: Medicare Other | Admitting: Family Medicine

## 2014-09-01 ENCOUNTER — Ambulatory Visit: Payer: Self-pay | Admitting: Family Medicine

## 2014-09-01 VITALS — BP 120/72 | HR 73 | Temp 98.1°F | Wt 108.0 lb

## 2014-09-01 DIAGNOSIS — E875 Hyperkalemia: Secondary | ICD-10-CM

## 2014-09-01 DIAGNOSIS — E785 Hyperlipidemia, unspecified: Secondary | ICD-10-CM

## 2014-09-01 DIAGNOSIS — E038 Other specified hypothyroidism: Secondary | ICD-10-CM | POA: Diagnosis not present

## 2014-09-01 DIAGNOSIS — I10 Essential (primary) hypertension: Secondary | ICD-10-CM

## 2014-09-01 DIAGNOSIS — D7589 Other specified diseases of blood and blood-forming organs: Secondary | ICD-10-CM

## 2014-09-01 DIAGNOSIS — M19041 Primary osteoarthritis, right hand: Secondary | ICD-10-CM | POA: Diagnosis not present

## 2014-09-01 DIAGNOSIS — E034 Atrophy of thyroid (acquired): Secondary | ICD-10-CM

## 2014-09-01 DIAGNOSIS — M19042 Primary osteoarthritis, left hand: Secondary | ICD-10-CM

## 2014-09-01 LAB — BASIC METABOLIC PANEL
BUN: 30 mg/dL — ABNORMAL HIGH (ref 6–23)
CALCIUM: 10.4 mg/dL (ref 8.4–10.5)
CO2: 29 mEq/L (ref 19–32)
Chloride: 103 mEq/L (ref 96–112)
Creatinine, Ser: 1.03 mg/dL (ref 0.40–1.20)
GFR: 54.63 mL/min — AB (ref >60.00)
Glucose, Bld: 96 mg/dL (ref 70–99)
Potassium: 6 mEq/L — ABNORMAL HIGH (ref 3.5–5.1)
SODIUM: 138 meq/L (ref 135–145)

## 2014-09-01 LAB — CBC
HCT: 42.8 % (ref 36.0–46.0)
HEMOGLOBIN: 14.1 g/dL (ref 12.0–15.0)
MCHC: 33.1 g/dL (ref 30.0–36.0)
MCV: 100.6 fl — AB (ref 78.0–100.0)
PLATELETS: 188 10*3/uL (ref 150.0–400.0)
RBC: 4.25 Mil/uL (ref 3.87–5.11)
RDW: 13.9 % (ref 11.5–15.5)
WBC: 6 10*3/uL (ref 4.0–10.5)

## 2014-09-01 LAB — TSH: TSH: 0.95 u[IU]/mL (ref 0.35–4.50)

## 2014-09-01 LAB — LDL CHOLESTEROL, DIRECT: LDL DIRECT: 50 mg/dL

## 2014-09-01 NOTE — Assessment & Plan Note (Signed)
S: controlled on metoprolol alone. Improved from last visit with me on 03/02/14 BP Readings from Last 3 Encounters:  09/01/14 120/72  05/15/14 122/80  03/02/14 150/90  A/P: continue current rx: metoprolol 100mg  BID

## 2014-09-01 NOTE — Assessment & Plan Note (Signed)
S: controlled on last check with atorvastatin 20mg  A/P: last LDL <100, repeat today. <70 even more ideal with history CVA and was 55 last July.

## 2014-09-01 NOTE — Assessment & Plan Note (Signed)
S: controlled on  Levothyroxine 75 mcg Lab Results  Component Value Date   TSH 1.01 02/23/2014  A/P: check TSH today, continue current rx

## 2014-09-01 NOTE — Patient Instructions (Signed)
No changes  Update meds today  Glad things are going well  Check in 6 months from now

## 2014-09-01 NOTE — Assessment & Plan Note (Signed)
S: controlled in hands and low back. Occasionally has some right elbow pain where had prior fracture as well.  A/P: discussed with CKD II and history CVA, nsaids not ideal. If any worsening in creatinine or recurrent CVA, would permanently discontinue. Continue etodolac for now but check bmet

## 2014-09-01 NOTE — Progress Notes (Signed)
Garret Reddish, MD  Subjective:  Theresa [name] is a 81 y.o. year old very pleasant female patient who presents with:  See problem oriented charting- ROS-no unintentional weight loss or gain, does have intermittent constipation then diarrhea-better on probiotics  Denies any CP, HA,  blurry vision, LE edema.   Past Medical History- a fib, history MVR, hx CVA and TIA, osteopenia, depression  Medications- reviewed and updated Current Outpatient Prescriptions  Medication Sig Dispense Refill  . Ascorbic Acid (VITAMIN C) 500 MG tablet Take 500 mg by mouth daily.      Marland Kitchen atorvastatin (LIPITOR) 20 MG tablet Take 1 tablet (20 mg total) by mouth every evening. 90 tablet 3  . Calcium Carbonate-Vitamin D (CALTRATE 600+D) 600-400 MG-UNIT per tablet Take 1 tablet by mouth 2 (two) times daily.      . Cholecalciferol (VITAMIN D) 2000 UNITS CAPS Take 2,000 Units by mouth daily.     . clindamycin (CLEOCIN T) 1 % lotion Apply 1 application topically 2 (two) times daily. Apply to face for rosacea 60 mL 3  . digoxin (LANOXIN) 0.125 MG tablet Take 0.5 tablets (0.0625 mg total) by mouth daily. 30 tablet 3  . escitalopram (LEXAPRO) 10 MG tablet Take 1 tablet (10 mg total) by mouth daily. 30 tablet 11  . etodolac (LODINE) 400 MG tablet Take one tablet twice daily. 180 tablet 3  . Folic Acid-Vit T4-SFK C12 (FOLBEE) 2.5-25-1 MG TABS tablet Take 1 tablet by mouth daily. 30 tablet 5  . glucosamine-chondroitin 500-400 MG tablet Take 1 tablet by mouth 2 (two) times daily.      Marland Kitchen levothyroxine (SYNTHROID, LEVOTHROID) 75 MCG tablet Take 1 tablet (75 mcg total) by mouth daily before breakfast. 90 tablet 0  . metoprolol (LOPRESSOR) 100 MG tablet Take 1 tablet (100 mg total) by mouth 2 (two) times daily. 180 tablet 1  . Multiple Vitamin (MULTIVITAMIN) tablet Take 1 tablet by mouth every evening.     . Multiple Vitamins-Minerals (PRESERVISION AREDS 2 PO) Take 1 tablet by mouth 2 (two) times daily.    Marland Kitchen OVER THE COUNTER  MEDICATION Pt uses `herbal form lotion-  camu  Clear- and an herbal pill to help with roseca    . rivaroxaban (XARELTO) 20 MG TABS tablet Take 1 tablet (20 mg total) by mouth daily with supper. 90 tablet 3  . saccharomyces boulardii (FLORASTOR) 250 MG capsule Take 250 mg by mouth daily.     . vitamin A 8000 UNIT capsule Take 8,000 Units by mouth daily.     Objective: BP 120/72 mmHg  Pulse 73  Temp(Src) 98.1 F (36.7 C)  Wt 108 lb (48.988 kg) Gen: NAD, resting comfortably No thyromegaly CV: RRR no murmurs rubs or gallops (not in a fib today) Lungs: CTAB no crackles, wheeze, rhonchi Abdomen: soft/nontender/nondistended/normal bowel sounds. No rebound or guarding.  Ext: no edema Skin: warm, dry, no rash Neuro: grossly normal, moves all extremities  Assessment/Plan:  Hypothyroidism S: controlled on  Levothyroxine 75 mcg Lab Results  Component Value Date   TSH 1.01 02/23/2014  A/P: check TSH today, continue current rx   Hypertension S: controlled on metoprolol alone. Improved from last visit with me on 03/02/14 BP Readings from Last 3 Encounters:  09/01/14 120/72  05/15/14 122/80  03/02/14 150/90  A/P: continue current rx: metoprolol 100mg  BID   Osteoarthritis S: controlled in hands and low back. Occasionally has some right elbow pain where had prior fracture as well.  A/P: discussed with CKD II and  history CVA, nsaids not ideal. If any worsening in creatinine or recurrent CVA, would permanently discontinue. Continue etodolac for now but check bmet   Hyperlipidemia S: controlled on last check with atorvastatin 20mg  A/P: last LDL <100, repeat today. <70 even more ideal with history CVA and was 73 last July.    6 months f/u. Consider CPE for next 6 month visit.   Orders Placed This Encounter  Procedures  . LDL cholesterol, direct    [place]  . Basic metabolic panel    [place]  . TSH    [place]  . CBC    [place]

## 2014-09-04 ENCOUNTER — Other Ambulatory Visit (INDEPENDENT_AMBULATORY_CARE_PROVIDER_SITE_OTHER): Payer: Medicare Other

## 2014-09-04 DIAGNOSIS — D7589 Other specified diseases of blood and blood-forming organs: Secondary | ICD-10-CM

## 2014-09-04 DIAGNOSIS — E875 Hyperkalemia: Secondary | ICD-10-CM

## 2014-09-04 LAB — BASIC METABOLIC PANEL
BUN: 29 mg/dL — AB (ref 6–23)
CALCIUM: 10.4 mg/dL (ref 8.4–10.5)
CO2: 30 meq/L (ref 19–32)
CREATININE: 1.05 mg/dL (ref 0.40–1.20)
Chloride: 104 mEq/L (ref 96–112)
GFR: 53.43 mL/min — AB (ref >60.00)
GLUCOSE: 130 mg/dL — AB (ref 70–99)
Potassium: 5 mEq/L (ref 3.5–5.1)
Sodium: 139 mEq/L (ref 135–145)

## 2014-09-04 LAB — FOLATE

## 2014-09-04 LAB — VITAMIN B12: Vitamin B-12: >1500 pg/mL — ABNORMAL HIGH (ref 211–911)

## 2014-09-04 NOTE — Addendum Note (Signed)
Addended by: Clyde Lundborg A on: 09/04/2014 08:10 AM   Modules accepted: Orders

## 2014-09-07 ENCOUNTER — Encounter: Payer: Self-pay | Admitting: Family Medicine

## 2014-09-13 ENCOUNTER — Other Ambulatory Visit: Payer: Self-pay | Admitting: Family Medicine

## 2014-10-17 ENCOUNTER — Telehealth: Payer: Self-pay | Admitting: Family Medicine

## 2014-10-17 MED ORDER — METOPROLOL TARTRATE 100 MG PO TABS: 100.0000 mg | ORAL_TABLET | Freq: Two times a day (BID) | ORAL | Status: DC

## 2014-10-17 NOTE — Telephone Encounter (Signed)
Medication refilled

## 2014-10-17 NOTE — Telephone Encounter (Signed)
Pt request refill of the following: metoprolol (LOPRESSOR) 100 MG tablet   Phamacy: [facility]

## 2014-11-20 NOTE — Progress Notes (Signed)
Patient ID: Theresa [name], female   DOB: February 15, 1932, 81 y.o.   MRN: [id number] 81 y.o.  has a history of mitral regurgitation, status post mitral valve repair at [facility] in 2008, paroxysmal atrial fibrillation, prior stroke in 12/12 at [facility] (felt to be embolic), on chronic Coumadin therapy, colitis, Raynaud's. She was seen in the emergency room 11/13/11 with dizziness. Head CT demonstrated atrophy and chronic microvascular ischemic changes, no acute findings. MRI did not show anything acute. EKG demonstrated atrial fibrillation with a heart rate of 122. She did see her PCP in followup who adjusted her Toprol for rate control. Subsequently cardioverted by Dr Harrington Challenger 10/13  Echo 11/26/11  Study Conclusions  - Left ventricle: The cavity size was normal. Wall thickness was normal. The estimated ejection fraction was 50%. Diffuse hypokinesis. - Mitral valve: S/P repair with mild residual MR Mild regurgitation. - Left atrium: The atrium was moderately to severely dilated. - Atrial septum: No defect or patent foramen ovale was Identified.  Last echo 03/22/13  EF normal 55% and intact MV repair with no residual MR    BP stable with beta blocker Seems like she has infrequent PAF Walking 2 miles with husband in mornings No bleeding issues   ROS: Denies fever, malais, weight loss, blurry vision, decreased visual acuity, cough, sputum, SOB, hemoptysis, pleuritic pain, palpitaitons, heartburn, abdominal pain, [name], lower extremity edema, claudication, or rash.  All other systems reviewed and negative  General: Affect appropriate Thin frail female HEENT: normal Neck supple with no adenopathy JVP normal no bruits no thyromegaly Lungs clear with no wheezing and good diaphragmatic motion Heart:  S1/S2 no murmur, no rub, gallop or click PMI normal Abdomen: benighn, BS positve, no tenderness, no AAA no bruit.  No HSM or HJR Distal pulses intact with no bruits No edema Neuro  non-focal Skin warm and dry No muscular weakness   Current Outpatient Prescriptions  Medication Sig Dispense Refill  . Ascorbic Acid (VITAMIN C) 500 MG tablet Take 500 mg by mouth daily.      Marland Kitchen atorvastatin (LIPITOR) 20 MG tablet Take 1 tablet (20 mg total) by mouth every evening. 90 tablet 3  . Calcium Carbonate-Vitamin D (CALTRATE 600+D) 600-400 MG-UNIT per tablet Take 1 tablet by mouth 2 (two) times daily.      . Cholecalciferol (VITAMIN D) 2000 UNITS CAPS Take 2,000 Units by mouth daily.     . digoxin (LANOXIN) 0.125 MG tablet Take 0.5 tablets (0.0625 mg total) by mouth daily. 30 tablet 3  . escitalopram (LEXAPRO) 10 MG tablet Take 1 tablet (10 mg total) by mouth daily. 30 tablet 11  . etodolac (LODINE) 400 MG tablet Take 400 mg by mouth daily.    . Folic Acid-Vit R4-BUL A45 (FOLBEE) 2.5-25-1 MG TABS tablet Take 1 tablet by mouth daily. 30 tablet 5  . glucosamine-chondroitin 500-400 MG tablet Take 1 tablet by mouth 2 (two) times daily.      Marland Kitchen levothyroxine (SYNTHROID, LEVOTHROID) 75 MCG tablet TAKE ONE TABLET (75 MCG TOTAL) BY MOUTH ONCE DAILY BEFORE BREAKFAST 90 tablet 2  . metoprolol (LOPRESSOR) 100 MG tablet Take 1 tablet (100 mg total) by mouth 2 (two) times daily. 180 tablet 2  . Multiple Vitamin (MULTIVITAMIN) tablet Take 1 tablet by mouth every evening.     . Multiple Vitamins-Minerals (PRESERVISION AREDS 2 PO) Take 1 tablet by mouth 2 (two) times daily.    Marland Kitchen OVER THE COUNTER MEDICATION Pt uses `herbal form lotion-  camu  Clear- and an herbal pill to help with roseca    . rivaroxaban (XARELTO) 20 MG TABS tablet Take 1 tablet (20 mg total) by mouth daily with supper. 90 tablet 3  . saccharomyces boulardii (FLORASTOR) 250 MG capsule Take 250 mg by mouth daily.     . vitamin A 8000 UNIT capsule Take 8,000 Units by mouth daily.     No current facility-administered medications for this visit.    Allergies  Rofecoxib  Electrocardiogram:  afib rate 75  11/16/13  Assessment and  Plan MV Repair: SBE prophylaxis  No murmur on exam intact by echo 2015 PAF: maint NSR on xarelto Anticoagulation no bleeding issues renal function normal followed by Dr Yong Channel Thyroid:  Lab Results  Component Value Date   TSH 0.95 09/01/2014   Chol:    Lab Results  Component Value Date   LDLCALC 46 10/05/2013

## 2014-11-22 ENCOUNTER — Encounter: Payer: Self-pay | Admitting: Cardiovascular Disease

## 2014-11-22 ENCOUNTER — Ambulatory Visit (INDEPENDENT_AMBULATORY_CARE_PROVIDER_SITE_OTHER): Payer: Medicare Other | Admitting: Cardiovascular Disease

## 2014-11-22 VITALS — BP 104/70 | HR 60 | Ht 61.0 in | Wt 105.1 lb

## 2014-11-22 DIAGNOSIS — I10 Essential (primary) hypertension: Secondary | ICD-10-CM

## 2014-11-22 DIAGNOSIS — I4891 Unspecified atrial fibrillation: Secondary | ICD-10-CM

## 2014-11-22 NOTE — Patient Instructions (Signed)
Medication Instructions:  NO CHANGES  Labwork: NONE  Testing/Procedures: NONE  Follow-Up: Your physician wants you to follow-up in: YEAR WITH  DR NISHAN You will receive a reminder letter in the mail two months in advance. If you don't receive a letter, please call our office to schedule the follow-up appointment.   Any Other Special Instructions Will Be Listed Below (If Applicable).   

## 2015-01-08 ENCOUNTER — Other Ambulatory Visit: Payer: Self-pay | Admitting: Family Medicine

## 2015-01-18 ENCOUNTER — Other Ambulatory Visit: Payer: Self-pay | Admitting: Family Medicine

## 2015-02-12 ENCOUNTER — Other Ambulatory Visit: Payer: Self-pay | Admitting: Family Medicine

## 2015-03-06 ENCOUNTER — Encounter: Payer: Self-pay | Admitting: Family Medicine

## 2015-03-06 ENCOUNTER — Ambulatory Visit (INDEPENDENT_AMBULATORY_CARE_PROVIDER_SITE_OTHER): Payer: Medicare Other | Admitting: Family Medicine

## 2015-03-06 ENCOUNTER — Other Ambulatory Visit: Payer: Self-pay | Admitting: Family Medicine

## 2015-03-06 VITALS — BP 128/72 | HR 59 | Temp 97.6°F | Ht 61.0 in | Wt 108.0 lb

## 2015-03-06 DIAGNOSIS — E038 Other specified hypothyroidism: Secondary | ICD-10-CM | POA: Diagnosis not present

## 2015-03-06 DIAGNOSIS — I10 Essential (primary) hypertension: Secondary | ICD-10-CM | POA: Diagnosis not present

## 2015-03-06 DIAGNOSIS — N183 Chronic kidney disease, stage 3 unspecified: Secondary | ICD-10-CM

## 2015-03-06 DIAGNOSIS — E034 Atrophy of thyroid (acquired): Secondary | ICD-10-CM

## 2015-03-06 DIAGNOSIS — Z23 Encounter for immunization: Secondary | ICD-10-CM | POA: Diagnosis not present

## 2015-03-06 DIAGNOSIS — L719 Rosacea, unspecified: Secondary | ICD-10-CM

## 2015-03-06 LAB — BASIC METABOLIC PANEL
BUN: 35 mg/dL — ABNORMAL HIGH (ref 6–23)
CO2: 30 mEq/L (ref 19–32)
Calcium: 10 mg/dL (ref 8.4–10.5)
Chloride: 105 mEq/L (ref 96–112)
Creatinine, Ser: 0.9 mg/dL (ref 0.40–1.20)
GFR: 63.75 mL/min (ref >60.00)
GLUCOSE: 85 mg/dL (ref 70–99)
POTASSIUM: 5.4 meq/L — AB (ref 3.5–5.1)
SODIUM: 144 meq/L (ref 135–145)

## 2015-03-06 LAB — TSH: TSH: 0.98 u[IU]/mL (ref 0.35–4.50)

## 2015-03-06 MED ORDER — CLINDAMYCIN PHOSPHATE 1 % EX LOTN: 1.0000 "application " | TOPICAL_LOTION | Freq: Two times a day (BID) | CUTANEOUS | Status: DC

## 2015-03-06 NOTE — Assessment & Plan Note (Signed)
S: controlled on levothyroxine 76mcg Lab Results  Component Value Date   TSH 0.95 09/01/2014  A/P: check TSH today.

## 2015-03-06 NOTE — Assessment & Plan Note (Signed)
S: ran out of clindamycin and would like to have for when symptoms worsen. Covered in makeup today and does not want to remove but feels like has worsened A/P: refilled clindamycin

## 2015-03-06 NOTE — Assessment & Plan Note (Signed)
S: controlled. On metoprolol alone- also for a fib rate control  BP Readings from Last 3 Encounters:  03/06/15 128/72  11/22/14 104/70  09/01/14 120/72  A/P:Continue current meds:  No change

## 2015-03-06 NOTE — Patient Instructions (Signed)
Labs before you go  Refilled clindamycin  Glad you are doing ok with etodolac just once a day- the lower the better with your stroke history and kidneys not being perfect  See you in 6 months

## 2015-03-06 NOTE — Assessment & Plan Note (Signed)
S: GFR of 50-60. Cut etodolac down to once a day and arthritic pain still tolerable A/P: ideally no nsaids with GFR and well as CVA history- she is aware of risks. Fortunately BP and lipids controlled. No change in rx today

## 2015-03-06 NOTE — Progress Notes (Signed)
Pre visit review using our clinic review tool, if applicable. No additional management support is needed unless otherwise documented below in the visit note. 

## 2015-03-06 NOTE — Progress Notes (Signed)
Garret Reddish, MD  Subjective:  Theresa [name] is a 81 y.o. year old very pleasant female patient who presents for/with See problem oriented charting ROS- No chest pain. For prolonged period has had shortness of breath with walking up hill in her neighborhood- no change since prior visits. No headache or blurry vision.   Past Medical History-  Patient Active Problem List   Diagnosis Date Noted  . TIA (transient ischemic attack) 03/21/2013    Priority: High  . Hx of ischemic left MCA stroke 04/08/2011    Priority: High  . Atrial fibrillation ([place]) 07/07/2007    Priority: High  . History of mitral valve repair 09/11/2006    Priority: High  . Hyperlipidemia 09/01/2014    Priority: Medium  . CKD (chronic kidney disease), stage III 03/02/2014    Priority: Medium  . Depression 11/23/2013    Priority: Medium  . Hypertension 03/04/2012    Priority: Medium  . Hypothyroidism 11/23/2006    Priority: Medium  . Osteopenia 11/23/2006    Priority: Medium  . Rosacea 11/23/2013    Priority: Low  . 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 PERSONAL HX COLONIC POLYPS 10/22/2009    Priority: Low  . CHANGE IN BOWELS 09/20/2009    Priority: Low  . GASTROESOPHAGEAL REFLUX DISEASE, MILD 06/04/2009    Priority: Low  . Raynaud's syndrome 03/06/2009    Priority: Low  . SCOLIOSIS, LUMBAR SPINE 01/03/2009    Priority: Low  . Osteoarthritis 09/11/2006    Priority: Low    Medications- reviewed and updated Current Outpatient Prescriptions  Medication Sig Dispense Refill  . Ascorbic Acid (VITAMIN C) 500 MG tablet Take 500 mg by mouth daily.      Marland Kitchen atorvastatin (LIPITOR) 20 MG tablet Take 1 tablet (20 mg total) by mouth every evening. 90 tablet 3  . Calcium Carbonate-Vitamin D (CALTRATE 600+D) 600-400 MG-UNIT per tablet Take 1 tablet by mouth 2 (two) times daily.      . Cholecalciferol (VITAMIN D) 2000 UNITS CAPS Take 2,000 Units by mouth daily.     .  digoxin (LANOXIN) 0.125 MG tablet TAKE ONE-HALF TABLET BY MOUTH ONCE DAILY 30 tablet 0  . escitalopram (LEXAPRO) 10 MG tablet Take 1 tablet (10 mg total) by mouth daily. 30 tablet 11  . etodolac (LODINE) 400 MG tablet Take 400 mg by mouth daily.    Marland Kitchen FOLBEE 2.5-25-1 MG TABS tablet TAKE ONE TABLET BY MOUTH ONCE DAILY 30 tablet 5  . glucosamine-chondroitin 500-400 MG tablet Take 1 tablet by mouth 2 (two) times daily.      Marland Kitchen levothyroxine (SYNTHROID, LEVOTHROID) 75 MCG tablet TAKE ONE TABLET (75 MCG TOTAL) BY MOUTH ONCE DAILY BEFORE BREAKFAST 90 tablet 2  . metoprolol (LOPRESSOR) 100 MG tablet Take 1 tablet (100 mg total) by mouth 2 (two) times daily. 180 tablet 2  . Multiple Vitamin (MULTIVITAMIN) tablet Take 1 tablet by mouth every evening.     . Multiple Vitamins-Minerals (PRESERVISION AREDS 2 PO) Take 1 tablet by mouth 2 (two) times daily.    Marland Kitchen OVER THE COUNTER MEDICATION Pt uses `herbal form lotion-  camu  Clear- and an herbal pill to help with roseca    . rivaroxaban (XARELTO) 20 MG TABS tablet Take 1 tablet (20 mg total) by mouth daily with supper. 90 tablet 3  . saccharomyces boulardii (FLORASTOR) 250 MG capsule Take 250 mg by mouth daily.     . vitamin A 8000  UNIT capsule Take 8,000 Units by mouth daily.     No current facility-administered medications for this visit.    Objective: BP 128/72 mmHg  Pulse 59  Temp(Src) 97.6 F (36.4 C) (Oral)  Ht 5\' 1"  (1.549 m)  Wt 108 lb (48.988 kg)  BMI 20.42 kg/m2  SpO2 95% Gen: NAD, resting comfortably, thin CV: irregularly irregular. no murmurs rubs or gallops Lungs: CTAB no crackles, wheeze, rhonchi Abdomen: soft/nontender/nondistended/normal bowel sounds. No rebound or guarding.  Ext: no edema Skin: warm, dry Neuro: grossly normal, moves all extremities  Assessment/Plan:  Hypothyroidism S: controlled on levothyroxine 13mcg Lab Results  Component Value Date   TSH 0.95 09/01/2014  A/P: check TSH today.    Hypertension S:  controlled. On metoprolol alone- also for a fib rate control  BP Readings from Last 3 Encounters:  03/06/15 128/72  11/22/14 104/70  09/01/14 120/72  A/P:Continue current meds:  No change   CKD (chronic kidney disease), stage III S: GFR of 50-60. Cut etodolac down to once a day and arthritic pain still tolerable A/P: ideally no nsaids with GFR and well as CVA history- she is aware of risks. Fortunately BP and lipids controlled. No change in rx today   Rosacea S: ran out of clindamycin and would like to have for when symptoms worsen. Covered in makeup today and does not want to remove but feels like has worsened A/P: refilled clindamycin    6 months with sooner Return precautions advised.   Orders Placed This Encounter  Procedures  . Flu vaccine HIGH DOSE PF  . TSH    [place]  . Basic metabolic panel    [place]   Meds ordered this encounter  Medications  . clindamycin (CLEOCIN T) 1 % lotion    Sig: Apply 1 application topically 2 (two) times daily. Apply to face for rosacea    Dispense:  60 mL    Refill:  3

## 2015-03-15 LAB — HM MAMMOGRAPHY: HM MAMMO: NORMAL

## 2015-03-20 ENCOUNTER — Encounter: Payer: Self-pay | Admitting: Family Medicine

## 2015-04-03 ENCOUNTER — Encounter: Payer: Self-pay | Admitting: Family Medicine

## 2015-04-03 MED ORDER — DICLOFENAC SODIUM 1 % TD GEL: 2.0000 g | Freq: Three times a day (TID) | TRANSDERMAL | Status: DC | PRN

## 2015-05-14 ENCOUNTER — Other Ambulatory Visit: Payer: Self-pay

## 2015-05-14 ENCOUNTER — Encounter: Payer: Self-pay | Admitting: Family Medicine

## 2015-05-14 MED ORDER — ETODOLAC 400 MG PO TABS: 400.0000 mg | ORAL_TABLET | Freq: Every day | ORAL | Status: DC

## 2015-06-13 ENCOUNTER — Telehealth: Payer: Self-pay | Admitting: Family Medicine

## 2015-06-13 DIAGNOSIS — I4891 Unspecified atrial fibrillation: Secondary | ICD-10-CM

## 2015-06-13 DIAGNOSIS — I639 Cerebral infarction, unspecified: Secondary | ICD-10-CM

## 2015-06-25 MED ORDER — RIVAROXABAN 20 MG PO TABS: 20.0000 mg | ORAL_TABLET | Freq: Every day | ORAL | Status: DC

## 2015-06-25 NOTE — Telephone Encounter (Signed)
Pt needs Rx refill on Xarelto 20 mg 90 tablets [email or web address].  [employer] is in the name of Theresa [name]

## 2015-06-25 NOTE — Telephone Encounter (Signed)
Medication refilled

## 2015-06-27 ENCOUNTER — Other Ambulatory Visit: Payer: Self-pay | Admitting: Family Medicine

## 2015-06-27 DIAGNOSIS — I639 Cerebral infarction, unspecified: Secondary | ICD-10-CM

## 2015-06-27 DIAGNOSIS — I4891 Unspecified atrial fibrillation: Secondary | ICD-10-CM

## 2015-06-27 MED ORDER — RIVAROXABAN 20 MG PO TABS: 20.0000 mg | ORAL_TABLET | Freq: Every day | ORAL | Status: DC

## 2015-07-02 ENCOUNTER — Telehealth: Payer: Self-pay | Admitting: Family Medicine

## 2015-07-02 NOTE — Telephone Encounter (Signed)
Yes thanks, may dispense xarelto. We need to stop the etodolac though unfortunately- this is the 3rd reason she should not take it- history of stroke, kidney disease, and also on xarelto increasing bleeding risk. If she would like to come in to discuss she can. If she would like another opinion on pain management for her arthritis (mainly in hands) can refer to orthopedics

## 2015-07-02 NOTE — Telephone Encounter (Signed)
Pharm calling about Xarelto interaction with Rx etodolac increase risk of bleeding.  Can Xarelto be dispensed?

## 2015-07-03 NOTE — Telephone Encounter (Signed)
Pt would like 90 day supply w/refills °

## 2015-07-04 NOTE — Telephone Encounter (Signed)
Pharmacy is aware and will make note.

## 2015-07-11 ENCOUNTER — Encounter: Payer: Self-pay | Admitting: Family Medicine

## 2015-07-25 DIAGNOSIS — H52223 Regular astigmatism, bilateral: Secondary | ICD-10-CM | POA: Diagnosis not present

## 2015-08-02 ENCOUNTER — Other Ambulatory Visit: Payer: Self-pay | Admitting: *Deleted

## 2015-08-02 MED ORDER — METOPROLOL TARTRATE 100 MG PO TABS: 100.0000 mg | ORAL_TABLET | Freq: Two times a day (BID) | ORAL | Status: DC

## 2015-08-02 MED ORDER — ATORVASTATIN CALCIUM 20 MG PO TABS: 20.0000 mg | ORAL_TABLET | Freq: Every evening | ORAL | Status: DC

## 2015-08-14 ENCOUNTER — Encounter: Payer: Self-pay | Admitting: Family Medicine

## 2015-08-15 ENCOUNTER — Other Ambulatory Visit: Payer: Self-pay | Admitting: Family Medicine

## 2015-08-21 NOTE — Telephone Encounter (Signed)
Can you resend Rx for Lexapro to Pharm:  [facility]

## 2015-08-21 NOTE — Telephone Encounter (Signed)
Rx refill sent to pharmacy. 

## 2015-08-21 NOTE — Telephone Encounter (Signed)
Pt following up on refill request sent 5/31 escitalopram (LEXAPRO) 10 MG tablet  Pt has a follow up appointment 6/20  [facility]

## 2015-08-30 DIAGNOSIS — H353132 Nonexudative age-related macular degeneration, bilateral, intermediate dry stage: Secondary | ICD-10-CM | POA: Diagnosis not present

## 2015-08-30 DIAGNOSIS — H348322 Tributary (branch) retinal vein occlusion, left eye, stable: Secondary | ICD-10-CM | POA: Diagnosis not present

## 2015-08-30 DIAGNOSIS — H43813 Vitreous degeneration, bilateral: Secondary | ICD-10-CM | POA: Diagnosis not present

## 2015-08-30 DIAGNOSIS — H35372 Puckering of macula, left eye: Secondary | ICD-10-CM | POA: Diagnosis not present

## 2015-09-04 ENCOUNTER — Encounter: Payer: Self-pay | Admitting: Family Medicine

## 2015-09-04 ENCOUNTER — Ambulatory Visit (INDEPENDENT_AMBULATORY_CARE_PROVIDER_SITE_OTHER): Payer: Medicare Other | Admitting: Family Medicine

## 2015-09-04 VITALS — BP 130/82 | HR 72 | Temp 97.8°F | Ht 61.0 in | Wt 106.5 lb

## 2015-09-04 DIAGNOSIS — E034 Atrophy of thyroid (acquired): Secondary | ICD-10-CM

## 2015-09-04 DIAGNOSIS — I10 Essential (primary) hypertension: Secondary | ICD-10-CM

## 2015-09-04 DIAGNOSIS — N183 Chronic kidney disease, stage 3 unspecified: Secondary | ICD-10-CM

## 2015-09-04 DIAGNOSIS — I4891 Unspecified atrial fibrillation: Secondary | ICD-10-CM

## 2015-09-04 DIAGNOSIS — E038 Other specified hypothyroidism: Secondary | ICD-10-CM | POA: Diagnosis not present

## 2015-09-04 DIAGNOSIS — E785 Hyperlipidemia, unspecified: Secondary | ICD-10-CM

## 2015-09-04 LAB — COMPREHENSIVE METABOLIC PANEL
ALBUMIN: 4.3 g/dL (ref 3.5–5.2)
ALK PHOS: 74 U/L (ref 39–117)
ALT: 20 U/L (ref 0–35)
AST: 30 U/L (ref 0–37)
BILIRUBIN TOTAL: 1 mg/dL (ref 0.2–1.2)
BUN: 27 mg/dL — ABNORMAL HIGH (ref 6–23)
CALCIUM: 10 mg/dL (ref 8.4–10.5)
CO2: 31 mEq/L (ref 19–32)
Chloride: 102 mEq/L (ref 96–112)
Creatinine, Ser: 0.9 mg/dL (ref 0.40–1.20)
GFR: 63.68 mL/min (ref >60.00)
Glucose, Bld: 76 mg/dL (ref 70–99)
Potassium: 4 mEq/L (ref 3.5–5.1)
SODIUM: 140 meq/L (ref 135–145)
TOTAL PROTEIN: 6.8 g/dL (ref 6.0–8.3)

## 2015-09-04 LAB — CBC
HCT: 42.4 % (ref 36.0–46.0)
Hemoglobin: 14.1 g/dL (ref 12.0–15.0)
MCHC: 33.3 g/dL (ref 30.0–36.0)
MCV: 97.1 fl (ref 78.0–100.0)
PLATELETS: 179 10*3/uL (ref 150.0–400.0)
RBC: 4.36 Mil/uL (ref 3.87–5.11)
RDW: 13.9 % (ref 11.5–15.5)
WBC: 5.9 10*3/uL (ref 4.0–10.5)

## 2015-09-04 LAB — TSH: TSH: 0.77 u[IU]/mL (ref 0.35–4.50)

## 2015-09-04 LAB — LDL CHOLESTEROL, DIRECT: Direct LDL: 47 mg/dL

## 2015-09-04 NOTE — Assessment & Plan Note (Signed)
S: rate controlled on metoprolol and digoxin. Also on xarelto for anticoagulation A/P: continue current medication- sees cards yearly

## 2015-09-04 NOTE — Progress Notes (Signed)
Pre visit review using our clinic review tool, if applicable. No additional management support is needed unless otherwise documented below in the visit note. 

## 2015-09-04 NOTE — Patient Instructions (Addendum)
Update labs before you leave  No changes in medicine for now- try to use the voltaren for the elbow  I would also like for you to sign up for an annual wellness visit on a Friday with our nurse Manuela Schwartz. This is a free benefit under medicare that may help [place] find additional ways to help you.   Health Maintenance Due  Topic Date Due  . COLONOSCOPY  05/25/2015  I will ask Dr. Fuller Plan about this

## 2015-09-04 NOTE — Assessment & Plan Note (Signed)
S: GFR 50-60 range- cut down etodolac to once a day last visit 2 visits ago- doing reasonably well on that dose without recent worsening A/P: update BMET today- if worsening may have to cut etodolac completely- fortunately voltaren gel helps but she just does not prefer to use

## 2015-09-04 NOTE — Progress Notes (Signed)
Subjective:  Theresa [name] is a 82 y.o. year old very pleasant female patient who presents for/with See problem oriented charting ROS- complains of arthritic pains in hips, knees. Has some rib pain at times. No chest pain or blurry vision.see any ROS included in HPI as well.   Past Medical History-  Patient Active Problem List   Diagnosis Date Noted  . TIA (transient ischemic attack) 03/21/2013    Priority: High  . Hx of ischemic left MCA stroke 04/08/2011    Priority: High  . Atrial fibrillation ([place]) 07/07/2007    Priority: High  . History of mitral valve repair 09/11/2006    Priority: High  . Hyperlipidemia 09/01/2014    Priority: Medium  . CKD (chronic kidney disease), stage III 03/02/2014    Priority: Medium  . Depression 11/23/2013    Priority: Medium  . Hypertension 03/04/2012    Priority: Medium  . Hypothyroidism 11/23/2006    Priority: Medium  . Osteopenia 11/23/2006    Priority: Medium  . Rosacea 11/23/2013    Priority: Low  . 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 PERSONAL HX COLONIC POLYPS 10/22/2009    Priority: Low  . CHANGE IN BOWELS 09/20/2009    Priority: Low  . GASTROESOPHAGEAL REFLUX DISEASE, MILD 06/04/2009    Priority: Low  . Raynaud's syndrome 03/06/2009    Priority: Low  . SCOLIOSIS, LUMBAR SPINE 01/03/2009    Priority: Low  . Osteoarthritis 09/11/2006    Priority: Low    Medications- reviewed and updated Current Outpatient Prescriptions  Medication Sig Dispense Refill  . atorvastatin (LIPITOR) 20 MG tablet Take 1 tablet (20 mg total) by mouth every evening. 90 tablet 3  . Calcium Carbonate-Vitamin D (CALTRATE 600+D) 600-400 MG-UNIT per tablet Take 1 tablet by mouth 2 (two) times daily.      . Cholecalciferol (VITAMIN D) 2000 UNITS CAPS Take 2,000 Units by mouth daily.     . digoxin (LANOXIN) 0.125 MG tablet TAKE ONE-HALF TABLET BY MOUTH ONCE DAILY 30 tablet 5  . escitalopram (LEXAPRO) 10 MG  tablet TAKE ONE TABLET BY (10 MG TOTAL) MOUTH ONCE DAILY 30 tablet 0  . etodolac (LODINE) 400 MG tablet Take 1 tablet (400 mg total) by mouth daily. 90 tablet 2  . glucosamine-chondroitin 500-400 MG tablet Take 1 tablet by mouth 2 (two) times daily.      Marland Kitchen levothyroxine (SYNTHROID, LEVOTHROID) 75 MCG tablet TAKE ONE TABLET BY MOUTH ONCE DAILY BEFORE  BREAKFAST 90 tablet 3  . metoprolol (LOPRESSOR) 100 MG tablet Take 1 tablet (100 mg total) by mouth 2 (two) times daily. 180 tablet 2  . rivaroxaban (XARELTO) 20 MG TABS tablet Take 1 tablet (20 mg total) by mouth daily with supper. 90 tablet 3  . clindamycin (CLEOCIN T) 1 % lotion Apply 1 application topically 2 (two) times daily. Apply to face for rosacea (Patient not taking: Reported on 09/04/2015) 60 mL 3  . diclofenac sodium (VOLTAREN) 1 % GEL Apply 2 g topically 3 (three) times daily as needed. (Patient not taking: Reported on 09/04/2015) 100 g 3  . Multiple Vitamin (MULTIVITAMIN) tablet Take 1 tablet by mouth every evening. Reported on 09/04/2015    . Multiple Vitamins-Minerals (PRESERVISION AREDS 2 PO) Take 1 tablet by mouth 2 (two) times daily. Reported on 09/04/2015     No current facility-administered medications for this visit.    Objective: BP 130/82 mmHg  Pulse 72  Temp(Src) 97.8 F (36.6  C) (Oral)  Ht 5\' 1"  (1.549 m)  Wt 106 lb 8 oz (48.308 kg)  BMI 20.13 kg/m2  SpO2 98% Gen: NAD, resting comfortably CV: irregularly irregular Lungs: CTAB no crackles, wheeze, rhonchi Abdomen: soft/nontender/nondistended/normal bowel sounds. thin Ext: no edema Skin: warm, dry Neuro: grossly normal, moves all extremities  Assessment/Plan:  Hypothryoidism S: well controlled on levothyroxine 75 mcg previously Lab Results  Component Value Date   TSH 0.98 03/06/2015  ROS-No hair or nail changes. No heat/cold intolerance. No constipation or diarrhea. Denies shakiness or anxiety.  A/P: update TSH, do not suspect needed change  Hypertension S:  controlled on metoprolol 100mg  twice a day BP Readings from Last 3 Encounters:  09/04/15 130/82  03/06/15 128/72  11/22/14 104/70  A/P:Continue current medications  CKD (chronic kidney disease), stage III S: GFR 50-60 range- cut down etodolac to once a day last visit 2 visits ago- doing reasonably well on that dose without recent worsening A/P: update BMET today- if worsening may have to cut etodolac completely- fortunately voltaren gel helps but she just does not prefer to use  Atrial fibrillation S: rate controlled on metoprolol and digoxin. Also on xarelto for anticoagulation A/P: continue current medication- sees cards yearly    Return in about 6 months (around 03/05/2016) for physical.  Orders Placed This Encounter  Procedures  . CBC    [place]  . Comprehensive metabolic panel    [place]  . LDL cholesterol, direct      . TSH       Return precautions advised.  Garret Reddish, MD

## 2015-09-14 ENCOUNTER — Other Ambulatory Visit: Payer: Self-pay | Admitting: Family Medicine

## 2015-09-14 NOTE — Telephone Encounter (Signed)
Yes thanks 

## 2015-09-28 ENCOUNTER — Ambulatory Visit (INDEPENDENT_AMBULATORY_CARE_PROVIDER_SITE_OTHER): Payer: Medicare Other

## 2015-09-28 VITALS — BP 110/80 | HR 61 | Ht 61.0 in | Wt 104.4 lb

## 2015-09-28 DIAGNOSIS — Z0000 Encounter for general adult medical examination without abnormal findings: Secondary | ICD-10-CM

## 2015-09-28 NOTE — Progress Notes (Addendum)
Subjective:   Theresa [name] is a 82 y.o. female who presents for Medicare Annual (Subsequent) preventive examination.   HRA assessment completed during this visit with Theresa [name]  The Patient was informed that the wellness visit is to identify future health risk and educate and initiate measures that can reduce risk for increased disease through the lifespan.    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 Small glass of wine    Medications reviewed and adherent with no issues voiced   BMI: 19  Diet;  no ordered diet Is taking md [employer]; Dr. Ardeth Perfect;  Has thiamine; niacin; B6; B12; Folate; selenium; super greens;  States food supplement is  expensive but not buying extra vitamins  Can try smoothies in grocery store; frozen section;  Breakfast; eats fruit; blueberries; grapes;  Cereal or granola/ alternate meal plan Bread Dave's good seed bread;  Lunch; usually sandwich;  Supper; does not cook often; doesn't eat the same as spouse so cooking becomes more complicated  Resolved issues by buying frozen meals;  Goes out to eat approximately 2 times a week   Teeth or Denture issues? No issues; Does have regular dentist    Exercise;  Walks with spouse about 2 miles; 80 q day   [place]; townhome community; [place] Has been there since 89; Mikki Santee and her married in 03; 14 years;   Fall hx; no falls   Fall risk: Fear of falling? Not really Given education on "Fall Prevention in the Home" for more safety tips the patient can apply as appropriate.  Long term goal is to "age in place"  Advocated for a "Plan B" if something would  happen;  Their town home is on one level; no stairs   Safety features reviewed for safe community; firearms if in the home; smoke alarms; sun protection when outside; (avoids when she can)  driving difficulties or accidents Does not drive since the stroke  Has some "slowness of speech and minor issues with pronunciation not noticeable to the observer  Mental Health:  Any emotional problems? Anxious, depressed, irritable, sad or blue? 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  Goes to concerts when they can    Pain: pain level depends on what she is doing; has generalized pain from hip; legs or elbow;  s/p fused post vertebra in the past  Cognitive;  MMSE 29/30; corrected month;  Serial 7s from 100 x 5; Recall 2/3;  Overall very engaged; accurate; no memory issues interfering with daily life  Goes to concerts  [employer]; Being [employer] / staying engaged  Likes to read  Any dizziness when standing up? Some with hx of inner ear but not problematic now; no fear of falling   Mobilization and Functional losses from last year to this year?  Some things are harder but still able to live independently;   Sleep pattern changes; pretty much; had trouble sleeping last pm;   Urinary or fecal incontinence reviewed: no  Advanced Directive addressed; Completed when they married   Counseling Health Maintenance Gaps:  Colonoscopy; 09/2009 / DUE 05/2015 (dr. Yong Channel to fup with Dr. Fuller Plan) The patient stated Dr. Fuller Plan had reviewed and after 82; will not proceed with another colonoscopy   EKG: 04/2014  Mammogram: 03/15/2015; goes to [place]; 02/2016 Dexa/ 10/2008; reported osteopenia/ scoliosis  PAP: 07/2006  Hearing: 2000hz  both ears  Ophthalmology exam: Juanito Doom; sees her q 6 months Dr. Oval Linsey; may 2017; dry macular degeneration Is  following  Immunizations Due: (Vaccines reviewed and educated regarding any overdue)  Shingles affected nerve of voice box and inner ear.  Resolved at present   Individual Goal: to try and travel some and try new interest  Health Recommendations and Referrals Stay active and engaged in community; Continue to plan trips they can enjoy; Bus trips; private or other   Current Care Team reviewed and updated Dr. Johnsie Cancel; Cardiology     Education provided and lifestyle risk discussed   All Health Maintenance Gaps Reviewed for closure     Cardiac Risk Factors include: advanced age (>55men, >61 women);dyslipidemia;hypertension     Objective:     Vitals: BP 110/80 mmHg  Pulse 61  Ht 5\' 1"  (1.549 m)  Wt 104 lb 7 oz (47.373 kg)  BMI 19.74 kg/m2  SpO2 94%  Body mass index is 19.74 kg/(m^2).   Tobacco History  Smoking status  . Former Smoker  . Quit date: 04/28/1966  Smokeless tobacco  . Never Used     Counseling given: Not Answered   Past Medical History  Diagnosis Date  . Osteoarthritis   . Osteoporosis   . History of postmenopausal HRT   . MVP (mitral valve prolapse)     a. s/p MV repair at [facility] in 2008;  b. Echocardiogram 7/12: EF 50%, status post mitral valve repair with mild MR and minimal Theresa, mean gradient 4, moderate LAE, LA diam 55 mm; mild RAE, PASP 28-32;   Marland Kitchen Hypothyroidism   . Hx of adenomatous colonic polyps   . Collagenous colitis   . Atrial fibrillation ([facility])     AFIB  . CVA (cerebral vascular accident) ([place])     a.  L parietal CVA by MRI [id number], likely embolic;  b.  TEE by report with neg bubble study;  c.  carotid dopplers  12/12:  + plaque, no sig ICA stenosis    Past Surgical History  Procedure Laterality Date  . Cholecystectomy    . Lumbar fusion    . Tonsillectomy    . Dilation and curettage of uterus    . Cataract extraction    . Mitral valve repair  2008  . Cardioversion  01/09/2012    Procedure: CARDIOVERSION;  Surgeon: Jolaine Artist,  MD;  Location: [facility]COPY;  Service: Cardiovascular;  Laterality: N/A;   Family History  Problem Relation Age of Onset  . Cancer Father     COLON  . Stroke Neg Hx     1ST DEGREE RELATIVE <60   History  Sexual Activity  . Sexual Activity: Not Currently    Outpatient Encounter Prescriptions as of 09/28/2015  Medication Sig  . atorvastatin (LIPITOR) 20 MG tablet Take 1 tablet (20 mg total) by mouth every evening.  . Calcium Carbonate-Vitamin D (CALTRATE 600+D) 600-400 MG-UNIT per tablet Take 1 tablet by mouth 2 (two) times daily.    . Cholecalciferol (VITAMIN D) 2000 UNITS CAPS Take 2,000 Units by mouth daily.   . diclofenac sodium (VOLTAREN) 1 %  GEL Apply 2 g topically 3 (three) times daily as needed.  . digoxin (LANOXIN) 0.125 MG tablet TAKE ONE-HALF TABLET BY MOUTH ONCE DAILY  . escitalopram (LEXAPRO) 10 MG tablet TAKE ONE TABLET BY MOUTH ONCE DAILY  . etodolac (LODINE) 400 MG tablet Take 1 tablet (400 mg total) by mouth daily.  Marland Kitchen glucosamine-chondroitin 500-400 MG tablet Take 1 tablet by mouth 2 (two) times daily.    Marland Kitchen levothyroxine (SYNTHROID, LEVOTHROID) 75 MCG tablet TAKE ONE TABLET BY MOUTH ONCE DAILY BEFORE  BREAKFAST  . metoprolol (LOPRESSOR) 100 MG tablet Take 1 tablet (100 mg total) by mouth 2 (two) times daily.  . Multiple Vitamin (MULTIVITAMIN) tablet Take 1 tablet by mouth every evening. Reported on 09/04/2015  . Multiple Vitamins-Minerals (PRESERVISION AREDS 2 PO) Take 1 tablet by mouth 2 (two) times daily. Reported on 09/04/2015  . rivaroxaban (XARELTO) 20 MG TABS tablet Take 1 tablet (20 mg total) by mouth daily with supper.  . clindamycin (CLEOCIN T) 1 % lotion Apply 1 application topically 2 (two) times daily. Apply to face for rosacea (Patient not taking: Reported on 09/04/2015)   No facility-administered encounter medications on file as of 09/28/2015.    Activities of Daily Living In your present state of health, do you have any difficulty performing the following  activities: 09/28/2015 09/28/2015  Hearing? N N  Vision? N Y  Difficulty concentrating or making decisions? N -  Walking or climbing stairs? N -  Dressing or bathing? N -  Doing errands, shopping? N -  Preparing Food and eating ? N -  Using the Toilet? N -  In the past six months, have you accidently leaked urine? (No Data) -  Do you have problems with loss of bowel control? N -  Managing your Medications? N -  Managing your Finances? N -  Housekeeping or managing your Housekeeping? N -    Patient Care Team: Marin Olp, MD as PCP - General (Family Medicine)    Assessment:     Exercise Activities and Dietary recommendations Current Exercise Habits: Home exercise routine, Type of exercise: walking, Time (Minutes): 45, Intensity: Mild  Goals    . patient     Did like to travel; think about options; Private trip with other; plan something away this year       Fall Risk Fall Risk  09/28/2015 09/01/2014 03/25/2013 02/28/2013 01/13/2013  Falls in the past year? No No Yes Yes No  Number falls in past yr: - - 1 - -  Injury with Fall? - - No - -  Risk for fall due to : - - Impaired mobility Impaired balance/gait Impaired balance/gait   Depression Screen PHQ 2/9 Scores 09/28/2015 09/01/2014 03/25/2013 02/28/2013  PHQ - 2 Score 0 0 1 3  PHQ- 9 Score - - - 9    Affect is good; appropriate   Cognitive Testing MMSE - Mini Mental State Exam 09/28/2015 02/15/2014  Orientation to time 4 5  Orientation to Place 5 5  Registration 3 3  Attention/ Calculation 5 3  Recall 2 3  Language- name 2 objects 2 2  Language- repeat 1 1  Language- follow 3 step command 3 3  Language- read & follow direction 1 1  Write a sentence 1 1  Copy design 1 1  Total score 28 28   29/30; Orientation of month was corrected;  Good historian; accurate and specific information given  Immunization History  Administered Date(s) Administered  . Influenza Split 12/17/2010, 12/02/2011  .  Influenza Whole  03/17/2001, 12/18/2006, 12/28/2007, 01/03/2009, 12/26/2009  . Influenza, High Dose Seasonal PF 12/22/2012, 03/06/2015  . Influenza,inj,Quad PF,36+ Mos 11/23/2013  . Pneumococcal Conjugate-13 03/02/2014  . Pneumococcal Polysaccharide-23 03/17/2000, 09/16/2006  . Td 03/18/1995, 09/15/2006  . Tdap 12/17/2010   Screening Tests Health Maintenance  Topic Date Due  . COLONOSCOPY  05/25/2015  . ZOSTAVAX  12/16/2018 (Originally 06/13/1993)  . INFLUENZA VACCINE  10/16/2015  . TETANUS/TDAP  12/16/2020  . DEXA SCAN  Completed  . PNA vac Low Risk Adult  Completed      Plan:   Continue to walk; Stay active and travel when able.  She is taking a food supplement;  Educated to take in protein; Had to assess actual diet;   Recommend consideration of plan B for long term living if current plan fails.   During the course of the visit the patient was educated and counseled about the following appropriate screening and preventive services:   Vaccines to include Pneumoccal, Influenza, Hepatitis B, Td, Zostavax, HCV/ hx shingles   Electrocardiogram  Cardiovascular Disease/ BP ok   Colorectal cancer screening/ aged out; will not repeat  Bone density screening  Diabetes screening  Glaucoma screening  Mammography/PAP  Nutrition counseling bears monitoring  Patient Instructions (the written plan) was given to the patient.   [id number], RN  09/28/2015  I have reviewed and agree with note, evaluation, plan.   Garret Reddish, MD

## 2015-09-28 NOTE — Patient Instructions (Addendum)
[name] , Thank you for taking time to come for your Medicare Wellness Visit. I appreciate your ongoing commitment to your health goals. Please review the following plan we discussed and let me know if I can assist you in the future.    These are the goals we discussed: Goals    . patient     Did like to travel; think about options; Private trip with other; plan something away this year        This is a list of the screening recommended for you and due dates:  Health Maintenance  Topic Date Due  . Colon Cancer Screening  05/25/2015  . Shingles Vaccine  12/16/2018*  . Flu Shot  10/16/2015  . Tetanus Vaccine  12/16/2020  . DEXA scan (bone density measurement)  Completed  . Pneumonia vaccines  Completed  *Topic was postponed. The date shown is not the original due date.      Fall Prevention in the Home  Falls can cause injuries. They can happen to people of all ages. There are many things you can do to make your home safe and to help prevent falls.  WHAT CAN I DO ON THE OUTSIDE OF MY HOME?  Regularly fix the edges of walkways and driveways and fix any cracks.  Remove anything that might make you trip as you walk through a door, such as a raised step or threshold.  Trim any bushes or trees on the path to your home.  Use bright outdoor lighting.  Clear any walking paths of anything that might make someone trip, such as rocks or tools.  Regularly check to see if handrails are loose or broken. Make sure that both sides of any steps have handrails.  Any raised decks and porches should have guardrails on the edges.  Have any leaves, snow, or ice cleared regularly.  Use sand or salt on walking paths during winter.  Clean up any spills in your garage right away. This includes oil or grease spills. WHAT CAN I DO IN THE BATHROOM?   Use night lights.  Install grab bars by the toilet and in the tub and shower. Do not use towel bars as grab bars.  Use non-skid mats or  decals in the tub or shower.  If you need to sit down in the shower, use a plastic, non-slip stool.  Keep the floor dry. Clean up any water that spills on the floor as soon as it happens.  Remove soap buildup in the tub or shower regularly.  Attach bath mats securely with double-sided non-slip rug tape.  Do not have throw rugs and other things on the floor that can make you trip. WHAT CAN I DO IN THE BEDROOM?  Use night lights.  Make sure that you have a light by your bed that is easy to reach.  Do not use any sheets or blankets that are too big for your bed. They should not hang down onto the floor.  Have a firm chair that has side arms. You can use this for support while you get dressed.  Do not have throw rugs and other things on the floor that can make you trip. WHAT CAN I DO IN THE KITCHEN?  Clean up any spills right away.  Avoid walking on wet floors.  Keep items that you use a lot in easy-to-reach places.  If you need to reach something above you, use a strong step stool that has a grab bar.  Keep electrical  cords out of the way.  Do not use floor polish or wax that makes floors slippery. If you must use wax, use non-skid floor wax.  Do not have throw rugs and other things on the floor that can make you trip. WHAT CAN I DO WITH MY STAIRS?  Do not leave any items on the stairs.  Make sure that there are handrails on both sides of the stairs and use them. Fix handrails that are broken or loose. Make sure that handrails are as long as the stairways.  Check any carpeting to make sure that it is firmly attached to the stairs. Fix any carpet that is loose or worn.  Avoid having throw rugs at the top or bottom of the stairs. If you do have throw rugs, attach them to the floor with carpet tape.  Make sure that you have a light switch at the top of the stairs and the bottom of the stairs. If you do not have them, ask someone to add them for you. WHAT ELSE CAN I DO TO  HELP PREVENT FALLS?  Wear shoes that:  Do not have high heels.  Have rubber bottoms.  Are comfortable and fit you well.  Are closed at the toe. Do not wear sandals.  If you use a stepladder:  Make sure that it is fully opened. Do not climb a closed stepladder.  Make sure that both sides of the stepladder are locked into place.  Ask someone to hold it for you, if possible.  Clearly mark and make sure that you can see:  Any grab bars or handrails.  First and last steps.  Where the edge of each step is.  Use tools that help you move around (mobility aids) if they are needed. These include:  Canes.  Walkers.  Scooters.  Crutches.  Turn on the lights when you go into a dark area. Replace any light bulbs as soon as they burn out.  Set up your furniture so you have a clear path. Avoid moving your furniture around.  If any of your floors are uneven, fix them.  If there are any pets around you, be aware of where they are.  Review your medicines with your doctor. Some medicines can make you feel dizzy. This can increase your chance of falling. Ask your doctor what other things that you can do to help prevent falls.   This information is not intended to replace advice given to you by your health care provider. Make sure you discuss any questions you have with your health care provider.   Document Released: 12/28/2008 Document Revised: 07/18/2014 Document Reviewed: 04/07/2014 Elsevier Interactive Patient Education 2016 [place] Maintenance, Female Adopting a healthy lifestyle and getting preventive care can go a long way to promote health and wellness. Talk with your health care provider about what schedule of regular examinations is right for you. This is a good chance for you to check in with your provider about disease prevention and staying healthy. In between checkups, there are plenty of things you can do on your own. Experts have done a lot of research  about which lifestyle changes and preventive measures are most likely to keep you healthy. Ask your health care provider for more information. WEIGHT AND DIET  Eat a healthy diet  Be sure to include plenty of vegetables, fruits, low-fat dairy products, and lean protein.  Do not eat a lot of foods high in solid fats, added sugars, or salt.  Get regular exercise. This is one of the most important things you can do for your health.  Most adults should exercise for at least 150 minutes each week. The exercise should increase your heart rate and make you sweat (moderate-intensity exercise).  Most adults should also do strengthening exercises at least twice a week. This is in addition to the moderate-intensity exercise.  Maintain a healthy weight  Body mass index (BMI) is a measurement that can be used to identify possible weight problems. It estimates body fat based on height and weight. Your health care provider can help determine your BMI and help you achieve or maintain a healthy weight.  For females 80 years of age and older:   A BMI below 18.5 is considered underweight.  A BMI of 18.5 to 24.9 is normal.  A BMI of 25 to 29.9 is considered overweight.  A BMI of 30 and above is considered obese.  Watch levels of cholesterol and blood lipids  You should start having your blood tested for lipids and cholesterol at 80 years of age, then have this test every 5 years.  You may need to have your cholesterol levels checked more often if:  Your lipid or cholesterol levels are high.  You are older than 80 years of age.  You are at high risk for heart disease.  CANCER SCREENING   Lung Cancer  Lung cancer screening is recommended for adults 90-80 years old who are at high risk for lung cancer because of a history of smoking.  A yearly low-dose CT scan of the lungs is recommended for people who:  Currently smoke.  Have quit within the past 15 years.  Have at least a  30-pack-year history of smoking. A pack year is smoking an average of one pack of cigarettes a day for 1 year.  Yearly screening should continue until it has been 15 years since you quit.  Yearly screening should stop if you develop a health problem that would prevent you from having lung cancer treatment.  Breast Cancer  Practice breast self-awareness. This means understanding how your breasts normally appear and feel.  It also means doing regular breast self-exams. Let your health care provider know about any changes, no matter how small.  If you are in your 20s or 30s, you should have a clinical breast exam (CBE) by a health care provider every 1-3 years as part of a regular health exam.  If you are 66 or older, have a CBE every year. Also consider having a breast X-ray (mammogram) every year.  If you have a family history of breast cancer, talk to your health care provider about genetic screening.  If you are at high risk for breast cancer, talk to your health care provider about having an MRI and a mammogram every year.  Breast cancer gene (BRCA) assessment is recommended for women who have family members with BRCA-related cancers. BRCA-related cancers include:  Breast.  Ovarian.  Tubal.  Peritoneal cancers.  Results of the assessment will determine the need for genetic counseling and BRCA1 and BRCA2 testing. Cervical Cancer Your health care provider may recommend that you be screened regularly for cancer of the pelvic organs (ovaries, uterus, and vagina). This screening involves a pelvic examination, including checking for microscopic changes to the surface of your cervix (Pap test). You may be encouraged to have this screening done every 3 years, beginning at age 43.  For women ages 35-65, health care providers may recommend pelvic exams and  Pap testing every 3 years, or they may recommend the Pap and pelvic exam, combined with testing for human papilloma virus (HPV), every 5  years. Some types of HPV increase your risk of cervical cancer. Testing for HPV may also be done on women of any age with unclear Pap test results.  Other health care providers may not recommend any screening for nonpregnant women who are considered low risk for pelvic cancer and who do not have symptoms. Ask your health care provider if a screening pelvic exam is right for you.  If you have had past treatment for cervical cancer or a condition that could lead to cancer, you need Pap tests and screening for cancer for at least 20 years after your treatment. If Pap tests have been discontinued, your risk factors (such as having a new sexual partner) need to be reassessed to determine if screening should resume. Some women have medical problems that increase the chance of getting cervical cancer. In these cases, your health care provider may recommend more frequent screening and Pap tests. Colorectal Cancer  This type of cancer can be detected and often prevented.  Routine colorectal cancer screening usually begins at 80 years of age and continues through 80 years of age.  Your health care provider may recommend screening at an earlier age if you have risk factors for colon cancer.  Your health care provider may also recommend using home test kits to check for hidden blood in the stool.  A small camera at the end of a tube can be used to examine your colon directly (sigmoidoscopy or colonoscopy). This is done to check for the earliest forms of colorectal cancer.  Routine screening usually begins at age 40.  Direct examination of the colon should be repeated every 5-10 years through 80 years of age. However, you may need to be screened more often if early forms of precancerous polyps or small growths are found. Skin Cancer  Check your skin from head to toe regularly.  Tell your health care provider about any new moles or changes in moles, especially if there is a change in a mole's shape or  color.  Also tell your health care provider if you have a mole that is larger than the size of a pencil eraser.  Always use sunscreen. Apply sunscreen liberally and repeatedly throughout the day.  Protect yourself by wearing long sleeves, pants, a wide-brimmed hat, and sunglasses whenever you are outside. HEART DISEASE, DIABETES, AND HIGH BLOOD PRESSURE   High blood pressure causes heart disease and increases the risk of stroke. High blood pressure is more likely to develop in:  People who have blood pressure in the high end of the normal range (130-139/85-89 mm Hg).  People who are overweight or obese.  People who are African American.  If you are 69-6 years of age, have your blood pressure checked every 3-5 years. If you are 62 years of age or older, have your blood pressure checked every year. You should have your blood pressure measured twice--once when you are at a hospital or clinic, and once when you are not at a hospital or clinic. Record the average of the two measurements. To check your blood pressure when you are not at a hospital or clinic, you can use:  An automated blood pressure machine at a pharmacy.  A home blood pressure monitor.  If you are between 76 years and 42 years old, ask your health care provider if you should take aspirin  to prevent strokes.  Have regular diabetes screenings. This involves taking a blood sample to check your fasting blood sugar level.  If you are at a normal weight and have a low risk for diabetes, have this test once every three years after 80 years of age.  If you are overweight and have a high risk for diabetes, consider being tested at a younger age or more often. PREVENTING INFECTION  Hepatitis B  If you have a higher risk for hepatitis B, you should be screened for this virus. You are considered at high risk for hepatitis B if:  You were born in a country where hepatitis B is common. Ask your health care provider which countries  are considered high risk.  Your parents were born in a high-risk country, and you have not been immunized against hepatitis B (hepatitis B vaccine).  You have HIV or AIDS.  You use needles to inject street drugs.  You live with someone who has hepatitis B.  You have had sex with someone who has hepatitis B.  You get hemodialysis treatment.  You take certain medicines for conditions, including cancer, organ transplantation, and autoimmune conditions. Hepatitis C  Blood testing is recommended for:  Everyone born from [birth year] through 1963.  Anyone with known risk factors for hepatitis C. Sexually transmitted infections (STIs)  You should be screened for sexually transmitted infections (STIs) including gonorrhea and chlamydia if:  You are sexually active and are younger than 80 years of age.  You are older than 80 years of age and your health care provider tells you that you are at risk for this type of infection.  Your sexual activity has changed since you were last screened and you are at an increased risk for chlamydia or gonorrhea. Ask your health care provider if you are at risk.  If you do not have HIV, but are at risk, it may be recommended that you take a prescription medicine daily to prevent HIV infection. This is called pre-exposure prophylaxis (PrEP). You are considered at risk if:  You are sexually active and do not regularly use condoms or know the HIV status of your partner(s).  You take drugs by injection.  You are sexually active with a partner who has HIV. Talk with your health care provider about whether you are at high risk of being infected with HIV. If you choose to begin PrEP, you should first be tested for HIV. You should then be tested every 3 months for as long as you are taking PrEP.  PREGNANCY   If you are premenopausal and you may become pregnant, ask your health care provider about preconception counseling.  If you may become pregnant, take 400 to  800 micrograms (mcg) of folic acid every day.  If you want to prevent pregnancy, talk to your health care provider about birth control (contraception). OSTEOPOROSIS AND MENOPAUSE   Osteoporosis is a disease in which the bones lose minerals and strength with aging. This can result in serious bone fractures. Your risk for osteoporosis can be identified using a bone density scan.  If you are 72 years of age or older, or if you are at risk for osteoporosis and fractures, ask your health care provider if you should be screened.  Ask your health care provider whether you should take a calcium or vitamin D supplement to lower your risk for osteoporosis.  Menopause may have certain physical symptoms and risks.  Hormone replacement therapy may reduce some of these symptoms  and risks. Talk to your health care provider about whether hormone replacement therapy is right for you.  HOME CARE INSTRUCTIONS   Schedule regular health, dental, and eye exams.  Stay current with your immunizations.   Do not use any tobacco products including cigarettes, chewing tobacco, or electronic cigarettes.  If you are pregnant, do not drink alcohol.  If you are breastfeeding, limit how much and how often you drink alcohol.  Limit alcohol intake to no more than 1 drink per day for nonpregnant women. One drink equals 12 ounces of beer, 5 ounces of wine, or 1 ounces of hard liquor.  Do not use street drugs.  Do not share needles.  Ask your health care provider for help if you need support or information about quitting drugs.  Tell your health care provider if you often feel depressed.  Tell your health care provider if you have ever been abused or do not feel safe at home.   This information is not intended to replace advice given to you by your health care provider. Make sure you discuss any questions you have with your health care provider.   Document Released: 09/16/2010 Document Revised: 03/24/2014  Document Reviewed: 02/02/2013 Elsevier Interactive Patient Education [employer].

## 2015-11-14 ENCOUNTER — Encounter: Payer: Self-pay | Admitting: Family Medicine

## 2015-11-26 ENCOUNTER — Encounter: Payer: Self-pay | Admitting: Family Medicine

## 2015-11-26 ENCOUNTER — Ambulatory Visit (INDEPENDENT_AMBULATORY_CARE_PROVIDER_SITE_OTHER): Payer: Medicare Other | Admitting: Family Medicine

## 2015-11-26 VITALS — BP 144/84 | HR 72 | Temp 97.9°F | Wt 104.4 lb

## 2015-11-26 DIAGNOSIS — M79604 Pain in right leg: Secondary | ICD-10-CM

## 2015-11-26 DIAGNOSIS — Z23 Encounter for immunization: Secondary | ICD-10-CM

## 2015-11-26 DIAGNOSIS — I10 Essential (primary) hypertension: Secondary | ICD-10-CM

## 2015-11-26 DIAGNOSIS — R0989 Other specified symptoms and signs involving the circulatory and respiratory systems: Secondary | ICD-10-CM | POA: Diagnosis not present

## 2015-11-26 MED ORDER — PREDNISONE 20 MG PO TABS: ORAL_TABLET | ORAL | 0 refills | Status: DC

## 2015-11-26 NOTE — Progress Notes (Signed)
Pre visit review using our clinic review tool, if applicable. No additional management support is needed unless otherwise documented below in the visit note. 

## 2015-11-26 NOTE — Patient Instructions (Addendum)
Try prednisone for 1 week  If not improving- can refer to orthopedics  Not the best pulse on top of right foot- will get a study on this- We will call you within a week about your referral for this. If you do not hear within 2 weeks, give [place] a call.

## 2015-11-26 NOTE — Progress Notes (Signed)
Subjective:  Theresa [name] is a 82 y.o. year old very pleasant female patient who presents for/with See problem oriented charting ROS- see any ROS included in HPI as well.   Past Medical History-  Patient Active Problem List   Diagnosis Date Noted  . TIA (transient ischemic attack) 03/21/2013    Priority: High  . Hx of ischemic left MCA stroke 04/08/2011    Priority: High  . Atrial fibrillation ([place]) 07/07/2007    Priority: High  . History of mitral valve repair 09/11/2006    Priority: High  . Hyperlipidemia 09/01/2014    Priority: Medium  . CKD (chronic kidney disease), stage III 03/02/2014    Priority: Medium  . Depression 11/23/2013    Priority: Medium  . Hypertension 03/04/2012    Priority: Medium  . Hypothyroidism 11/23/2006    Priority: Medium  . Osteopenia 11/23/2006    Priority: Medium  . Rosacea 11/23/2013    Priority: Low  . 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 PERSONAL HX COLONIC POLYPS 10/22/2009    Priority: Low  . CHANGE IN BOWELS 09/20/2009    Priority: Low  . GASTROESOPHAGEAL REFLUX DISEASE, MILD 06/04/2009    Priority: Low  . Raynaud's syndrome 03/06/2009    Priority: Low  . SCOLIOSIS, LUMBAR SPINE 01/03/2009    Priority: Low  . Osteoarthritis 09/11/2006    Priority: Low    Medications- reviewed and updated Current Outpatient Prescriptions  Medication Sig Dispense Refill  . atorvastatin (LIPITOR) 20 MG tablet Take 1 tablet (20 mg total) by mouth every evening. 90 tablet 3  . Calcium Carbonate-Vitamin D (CALTRATE 600+D) 600-400 MG-UNIT per tablet Take 1 tablet by mouth 2 (two) times daily.      . Cholecalciferol (VITAMIN D) 2000 UNITS CAPS Take 2,000 Units by mouth daily.     . diclofenac sodium (VOLTAREN) 1 % GEL Apply 2 g topically 3 (three) times daily as needed. 100 g 3  . digoxin (LANOXIN) 0.125 MG tablet TAKE ONE-HALF TABLET BY MOUTH ONCE DAILY 30 tablet 5  . escitalopram (LEXAPRO) 10 MG  tablet TAKE ONE TABLET BY MOUTH ONCE DAILY 30 tablet 5  . etodolac (LODINE) 400 MG tablet Take 1 tablet (400 mg total) by mouth daily. 90 tablet 2  . glucosamine-chondroitin 500-400 MG tablet Take 1 tablet by mouth 2 (two) times daily.      Marland Kitchen levothyroxine (SYNTHROID, LEVOTHROID) 75 MCG tablet TAKE ONE TABLET BY MOUTH ONCE DAILY BEFORE  BREAKFAST 90 tablet 3  . metoprolol (LOPRESSOR) 100 MG tablet Take 1 tablet (100 mg total) by mouth 2 (two) times daily. 180 tablet 2  . Multiple Vitamin (MULTIVITAMIN) tablet Take 1 tablet by mouth every evening. Reported on 09/04/2015    . Multiple Vitamins-Minerals (PRESERVISION AREDS 2 PO) Take 1 tablet by mouth 2 (two) times daily. Reported on 09/04/2015    . rivaroxaban (XARELTO) 20 MG TABS tablet Take 1 tablet (20 mg total) by mouth daily with supper. 90 tablet 3   No current facility-administered medications for this visit.     Objective: BP (!) 142/96 (BP Location: Left Arm, Patient Position: Sitting, Cuff Size: Normal)   Pulse 72   Temp 97.9 F (36.6 C) (Oral)   Wt 104 lb 6.4 oz (47.4 kg)   SpO2 97%   BMI 19.73 kg/m  Gen: NAD, resting comfortably CV: RRR no murmurs rubs or gallops, 2+ PT pulses on right, unable to detect DP pulse but good  distal perfusion Lungs: CTAB no crackles, wheeze, rhonchi Abdomen: soft/nontender/nondistended/normal bowel sounds. No rebound or guarding.  Ext: no edema Skin: warm, dry Back - Normal skin, Spine with normal alignment and no deformity.  No tenderness to vertebral process palpation.  Paraspinous muscles are somewhat tender as she is throughout right low back. Range of motion is full at neck and lumbar sacral regions. Negative Straight leg raise.  Neuro- no saddle anesthesia, 5/5 strength lower extremities, 2+ reflexes   Assessment/Plan:  Right leg pain S: Patient states she has had months of issues. Worse over last few days since she contacted me about OTC options. Tylenol has not helped. She was not able to  go on her walks with her husband the last 2 days due to pain up to 9/10. Seems to be worst in lateral calf and can feel numbness there but has pain in her back and sometimes outer hip all the way down to her leg. Has a history of back surgery with Theresa [name] years ago.  ROS- no weakness in leg, some numbness, tingling but no fecal or urinary incontinence or saddle anesthesia A/P: 82 year old female with history of a fib on xarelto, history CVA presenting with right leg pain seeming to start from back. Suspect this could be DDD related with nerve involvement. Trial prednisone. We did note her blood pressure 144/84 but suspect this is due to pain. If she does not improve- consider PT vs. Ortho referral.   In addition unable to detect pulse in DP area though 2+ in PT, will get ABI to evaluate for vascular cause though doubt  No problem-specific Assessment & Plan notes found for this encounter.   No Follow-up on file.  Orders Placed This Encounter  Procedures  . Flu vaccine HIGH DOSE PF    No orders of the defined types were placed in this encounter.   Return precautions advised.  Garret Reddish, MD

## 2015-12-05 ENCOUNTER — Ambulatory Visit (HOSPITAL_COMMUNITY)
Admission: RE | Admit: 2015-12-05 | Discharge: 2015-12-05 | Disposition: A | Discharge status: Home / Self Care | Payer: Medicare Other | Source: Ambulatory Visit | Attending: Internal Medicine | Admitting: Internal Medicine

## 2015-12-05 DIAGNOSIS — R0989 Other specified symptoms and signs involving the circulatory and respiratory systems: Secondary | ICD-10-CM | POA: Diagnosis not present

## 2015-12-06 ENCOUNTER — Encounter: Payer: Self-pay | Admitting: Family Medicine

## 2015-12-06 DIAGNOSIS — M79604 Pain in right leg: Secondary | ICD-10-CM

## 2015-12-06 NOTE — Progress Notes (Signed)
Patient ID: Theresa [name], female   DOB: 02/19/1932, 82 y.o.   MRN: [id number]   82 y.o.  has a history of mitral regurgitation, status post mitral valve repair at [facility] in 2008, paroxysmal atrial fibrillation, prior stroke in 12/12 at [facility] (felt to be embolic), on chronic Coumadin therapy, colitis, Raynaud's. She was seen in the emergency room 11/13/11 with dizziness. Head CT demonstrated atrophy and chronic microvascular ischemic changes, no acute findings. MRI did not show anything acute. EKG demonstrated atrial fibrillation with a heart rate of 122. She did see her PCP in followup who adjusted her Toprol for rate control. Subsequently cardioverted by Dr Harrington Challenger 10/13   Last echo 03/22/13  EF normal 55% and intact MV repair with no residual MR    BP stable with beta blocker Seems like she has infrequent PAF Walking 2 miles with husband in mornings No bleeding issues  11/26/15 seen by primary for right leg pain ? DJD given steroids ABI's Normal  Seen by Ortho   ROS: Denies fever, malais, weight loss, blurry vision, decreased visual acuity, cough, sputum, SOB, hemoptysis, pleuritic pain, palpitaitons, heartburn, abdominal pain, melena, lower extremity edema, claudication, or rash.  All other systems reviewed and negative  General: Affect appropriate Thin frail female HEENT: normal Neck supple with no adenopathy JVP normal no bruits no thyromegaly Lungs clear with no wheezing and good diaphragmatic motion Heart:  S1/S2 no murmur, no rub, gallop or click PMI normal Abdomen: benighn, BS positve, no tenderness, no AAA no bruit.  No HSM or HJR Distal pulses intact with no bruits No edema Neuro non-focal Skin warm and dry No muscular weakness   Current Outpatient Prescriptions  Medication Sig Dispense Refill  . atorvastatin (LIPITOR) 20 MG tablet Take 1 tablet (20 mg total) by mouth every evening. 90 tablet 3  . Calcium Carbonate-Vitamin D (CALTRATE 600+D)  600-400 MG-UNIT per tablet Take 1 tablet by mouth 2 (two) times daily.      . Cholecalciferol (VITAMIN D) 2000 UNITS CAPS Take 2,000 Units by mouth daily.     . diclofenac sodium (VOLTAREN) 1 % GEL Apply 2 g topically 3 (three) times daily as needed. 100 g 3  . digoxin (LANOXIN) 0.125 MG tablet TAKE ONE-HALF TABLET BY MOUTH ONCE DAILY 30 tablet 5  . escitalopram (LEXAPRO) 10 MG tablet TAKE ONE TABLET BY MOUTH ONCE DAILY 30 tablet 5  . etodolac (LODINE) 400 MG tablet Take 1 tablet (400 mg total) by mouth daily. 90 tablet 2  . glucosamine-chondroitin 500-400 MG tablet Take 1 tablet by mouth 2 (two) times daily.      Marland Kitchen levothyroxine (SYNTHROID, LEVOTHROID) 75 MCG tablet TAKE ONE TABLET BY MOUTH ONCE DAILY BEFORE  BREAKFAST 90 tablet 3  . metoprolol (LOPRESSOR) 100 MG tablet Take 1 tablet (100 mg total) by mouth 2 (two) times daily. 180 tablet 2  . Multiple Vitamin (MULTIVITAMIN) tablet Take 1 tablet by mouth every evening. Reported on 09/04/2015    . Multiple Vitamins-Minerals (PRESERVISION AREDS 2 PO) Take 1 tablet by mouth 2 (two) times daily. Reported on 09/04/2015    . rivaroxaban (XARELTO) 20 MG TABS tablet Take 1 tablet (20 mg total) by mouth daily with supper. 90 tablet 3   No current facility-administered medications for this visit.     Allergies  Rofecoxib  Electrocardiogram:  afib rate 75  11/16/13 05/15/14  afib  12/13/15 afib rate 71 no change   Assessment and Plan MV Repair: SBE prophylaxis  No murmur on exam intact by echo 2015 PAF: On xarelto Anticoagulation no bleeding issues renal function normal followed by Dr Yong Channel Thyroid:  Lab Results  Component Value Date   TSH 0.77 09/04/2015   Chol:    Lab Results  Component Value Date   LDLCALC 46 10/05/2013   Leg Pain: ? DJD ABI's were normal no evidence of embolic event compliant with NOAC ? Radicular seen by ortho this am Dr Corine Shelter and to have MRI since on NOAC recommended steroid taper and tylenol for pain    Jenkins Rouge

## 2015-12-13 ENCOUNTER — Ambulatory Visit (INDEPENDENT_AMBULATORY_CARE_PROVIDER_SITE_OTHER): Payer: Medicare Other | Admitting: Cardiovascular Disease

## 2015-12-13 ENCOUNTER — Encounter: Payer: Self-pay | Admitting: Cardiovascular Disease

## 2015-12-13 VITALS — BP 120/90 | HR 68 | Ht 61.0 in | Wt 105.0 lb

## 2015-12-13 DIAGNOSIS — M4150 Other secondary scoliosis, site unspecified: Secondary | ICD-10-CM | POA: Diagnosis not present

## 2015-12-13 DIAGNOSIS — M5416 Radiculopathy, lumbar region: Secondary | ICD-10-CM | POA: Diagnosis not present

## 2015-12-13 DIAGNOSIS — I4891 Unspecified atrial fibrillation: Secondary | ICD-10-CM | POA: Diagnosis not present

## 2015-12-13 DIAGNOSIS — Z981 Arthrodesis status: Secondary | ICD-10-CM | POA: Diagnosis not present

## 2015-12-13 NOTE — Patient Instructions (Addendum)

## 2015-12-21 DIAGNOSIS — M5416 Radiculopathy, lumbar region: Secondary | ICD-10-CM | POA: Diagnosis not present

## 2015-12-27 DIAGNOSIS — H353112 Nonexudative age-related macular degeneration, right eye, intermediate dry stage: Secondary | ICD-10-CM | POA: Diagnosis not present

## 2015-12-27 DIAGNOSIS — H353122 Nonexudative age-related macular degeneration, left eye, intermediate dry stage: Secondary | ICD-10-CM | POA: Diagnosis not present

## 2015-12-27 DIAGNOSIS — H04123 Dry eye syndrome of bilateral lacrimal glands: Secondary | ICD-10-CM | POA: Diagnosis not present

## 2015-12-27 DIAGNOSIS — M5416 Radiculopathy, lumbar region: Secondary | ICD-10-CM | POA: Diagnosis not present

## 2015-12-27 DIAGNOSIS — H35372 Puckering of macula, left eye: Secondary | ICD-10-CM | POA: Diagnosis not present

## 2016-01-09 DIAGNOSIS — Z981 Arthrodesis status: Secondary | ICD-10-CM | POA: Diagnosis not present

## 2016-01-09 DIAGNOSIS — M5416 Radiculopathy, lumbar region: Secondary | ICD-10-CM | POA: Diagnosis not present

## 2016-01-29 DIAGNOSIS — M5416 Radiculopathy, lumbar region: Secondary | ICD-10-CM | POA: Diagnosis not present

## 2016-02-12 ENCOUNTER — Other Ambulatory Visit: Payer: Self-pay | Admitting: Family Medicine

## 2016-03-04 DIAGNOSIS — M5416 Radiculopathy, lumbar region: Secondary | ICD-10-CM | POA: Diagnosis not present

## 2016-03-05 ENCOUNTER — Ambulatory Visit (INDEPENDENT_AMBULATORY_CARE_PROVIDER_SITE_OTHER): Payer: Medicare Other | Admitting: Family Medicine

## 2016-03-05 ENCOUNTER — Ambulatory Visit: Payer: Medicare Other | Admitting: Family Medicine

## 2016-03-05 ENCOUNTER — Other Ambulatory Visit: Payer: Self-pay | Admitting: Family Medicine

## 2016-03-05 ENCOUNTER — Encounter: Payer: Self-pay | Admitting: Family Medicine

## 2016-03-05 VITALS — BP 132/82 | HR 72 | Temp 97.5°F | Ht 62.0 in | Wt 106.0 lb

## 2016-03-05 DIAGNOSIS — E785 Hyperlipidemia, unspecified: Secondary | ICD-10-CM | POA: Diagnosis not present

## 2016-03-05 DIAGNOSIS — E034 Atrophy of thyroid (acquired): Secondary | ICD-10-CM

## 2016-03-05 DIAGNOSIS — Z0000 Encounter for general adult medical examination without abnormal findings: Secondary | ICD-10-CM

## 2016-03-05 LAB — CBC WITH DIFFERENTIAL/PLATELET
BASOS PCT: 0.1 % (ref 0.0–3.0)
Basophils Absolute: 0 10*3/uL (ref 0.0–0.1)
EOS ABS: 0.1 10*3/uL (ref 0.0–0.7)
Eosinophils Relative: 0.5 % (ref 0.0–5.0)
HEMATOCRIT: 40.7 % (ref 36.0–46.0)
HEMOGLOBIN: 13.8 g/dL (ref 12.0–15.0)
LYMPHS PCT: 14.3 % (ref 12.0–46.0)
Lymphs Abs: 1.6 10*3/uL (ref 0.7–4.0)
MCHC: 33.9 g/dL (ref 30.0–36.0)
MCV: 97.4 fl (ref 78.0–100.0)
MONOS PCT: 7.9 % (ref 3.0–12.0)
Monocytes Absolute: 0.9 10*3/uL (ref 0.1–1.0)
Neutro Abs: 8.5 10*3/uL — ABNORMAL HIGH (ref 1.4–7.7)
Neutrophils Relative %: 77.2 % — ABNORMAL HIGH (ref 43.0–77.0)
Platelets: 191 10*3/uL (ref 150.0–400.0)
RBC: 4.18 Mil/uL (ref 3.87–5.11)
RDW: 13.7 % (ref 11.5–15.5)
WBC: 11 10*3/uL — AB (ref 4.0–10.5)

## 2016-03-05 LAB — COMPREHENSIVE METABOLIC PANEL
ALBUMIN: 4.4 g/dL (ref 3.5–5.2)
ALT: 16 U/L (ref 0–35)
AST: 22 U/L (ref 0–37)
Alkaline Phosphatase: 94 U/L (ref 39–117)
BILIRUBIN TOTAL: 0.8 mg/dL (ref 0.2–1.2)
BUN: 34 mg/dL — ABNORMAL HIGH (ref 6–23)
CALCIUM: 9.6 mg/dL (ref 8.4–10.5)
CHLORIDE: 101 meq/L (ref 96–112)
CO2: 29 mEq/L (ref 19–32)
CREATININE: 0.92 mg/dL (ref 0.40–1.20)
GFR: 62 mL/min (ref >60.00)
Glucose, Bld: 98 mg/dL (ref 70–99)
Potassium: 3.6 mEq/L (ref 3.5–5.1)
Sodium: 140 mEq/L (ref 135–145)
Total Protein: 6.5 g/dL (ref 6.0–8.3)

## 2016-03-05 LAB — TSH: TSH: 0.57 u[IU]/mL (ref 0.35–4.50)

## 2016-03-05 LAB — LIPID PANEL
CHOLESTEROL: 131 mg/dL (ref 0–200)
HDL: 72.7 mg/dL (ref >39.00)
LDL Cholesterol: 45 mg/dL (ref 0–99)
NonHDL: 57.94
TRIGLYCERIDES: 65 mg/dL (ref 0.0–149.0)
Total CHOL/HDL Ratio: 2
VLDL: 13 mg/dL (ref 0.0–40.0)

## 2016-03-05 MED ORDER — DICLOFENAC SODIUM 1 % TD GEL: 2.0000 g | Freq: Three times a day (TID) | TRANSDERMAL | 3 refills | Status: DC | PRN

## 2016-03-05 NOTE — Patient Instructions (Addendum)
Labs before you leave  Refilled voltaren. We discussed risks of etodolac- glad you are down to once a day.   Schedule your bone density test at check out desk

## 2016-03-05 NOTE — Progress Notes (Signed)
Phone: [phone]  Subjective:  Patient presents today for their annual physical. Chief complaint-noted.   See problem oriented charting- ROS- full  review of systems was completed and negative except for: some difficulty with vision (macular degeneration), some balance issues with right leg pain recently- considering PT, some SOB with hills but hasnt been walking as much  The following were reviewed and entered/updated in epic: Past Medical History:  Diagnosis Date  . Atrial fibrillation (HCC)    AFIB  . Collagenous colitis   . CVA (cerebral vascular accident) ([place])    a.  L parietal CVA by MRI [id number], likely embolic;  b.  TEE by report with neg bubble study;  c.  carotid dopplers  12/12:  + plaque, no sig ICA stenosis   . History of postmenopausal HRT   . Hx of adenomatous colonic polyps   . Hypothyroidism   . MVP (mitral valve prolapse)    a. s/p MV repair at [facility] in 2008;  b. Echocardiogram 7/12: EF 50%, status post mitral valve repair with mild MR and minimal MS, mean gradient 4, moderate LAE, LA diam 55 mm; mild RAE, PASP 28-32;   Marland Kitchen Osteoarthritis   . Osteoporosis    Patient Active Problem List   Diagnosis Date Noted  . TIA (transient ischemic attack) 03/21/2013    Priority: High  . Hx of ischemic left MCA stroke 04/08/2011    Priority: High  . Atrial fibrillation ([place]) 07/07/2007    Priority: High  . History of mitral valve repair 09/11/2006    Priority: High  . Hyperlipidemia 09/01/2014    Priority: Medium  . CKD (chronic kidney disease), stage III 03/02/2014    Priority: Medium  . Depression 11/23/2013    Priority: Medium  . Hypertension 03/04/2012    Priority: Medium  . Hypothyroidism 11/23/2006    Priority: Medium  . Osteopenia 11/23/2006    Priority: Medium  . Rosacea 11/23/2013    Priority: Low  . 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 PERSONAL HX COLONIC POLYPS 10/22/2009    Priority: Low  . CHANGE IN  BOWELS 09/20/2009    Priority: Low  . GASTROESOPHAGEAL REFLUX DISEASE, MILD 06/04/2009    Priority: Low  . Raynaud's syndrome 03/06/2009    Priority: Low  . SCOLIOSIS, LUMBAR SPINE 01/03/2009    Priority: Low  . Osteoarthritis 09/11/2006    Priority: Low   Past Surgical History:  Procedure Laterality Date  . CARDIOVERSION  01/09/2012   Procedure: CARDIOVERSION;  Surgeon: Jolaine Artist, MD;  Location: [facility] ENDOSCOPY;  Service: Cardiovascular;  Laterality: N/A;  . CATARACT EXTRACTION    . CHOLECYSTECTOMY    . DILATION AND CURETTAGE OF UTERUS    . LUMBAR FUSION    . MITRAL VALVE REPAIR  2008  . TONSILLECTOMY      Family History  Problem Relation Age of Onset  . Cancer Father     COLON  . Stroke Neg Hx     1ST DEGREE RELATIVE <60    Medications- reviewed and updated Current Outpatient Prescriptions  Medication Sig Dispense Refill  . atorvastatin (LIPITOR) 20 MG tablet Take 1 tablet (20 mg total) by mouth every evening. 90 tablet 3  . Calcium Carbonate-Vitamin D (CALTRATE 600+D) 600-400 MG-UNIT per tablet Take 1 tablet by mouth 2 (two) times daily.      . Cholecalciferol (VITAMIN D) 2000 UNITS CAPS Take 2,000 Units by mouth daily.     Marland Kitchen  diclofenac sodium (VOLTAREN) 1 % GEL Apply 2 g topically 3 (three) times daily as needed. 100 g 3  . digoxin (LANOXIN) 0.125 MG tablet TAKE ONE-HALF TABLET BY MOUTH ONCE DAILY 30 tablet 5  . escitalopram (LEXAPRO) 10 MG tablet TAKE ONE TABLET BY MOUTH ONCE DAILY 30 tablet 5  . etodolac (LODINE) 400 MG tablet TAKE ONE TABLET BY MOUTH ONCE DAILY 90 tablet 2  . glucosamine-chondroitin 500-400 MG tablet Take 1 tablet by mouth 2 (two) times daily.      Marland Kitchen levothyroxine (SYNTHROID, LEVOTHROID) 75 MCG tablet TAKE ONE TABLET BY MOUTH ONCE DAILY BEFORE  BREAKFAST 90 tablet 3  . metoprolol (LOPRESSOR) 100 MG tablet Take 1 tablet (100 mg total) by mouth 2 (two) times daily. 180 tablet 2  . Multiple Vitamins-Minerals (PRESERVISION AREDS 2 PO) Take 1  tablet by mouth 2 (two) times daily. Reported on 09/04/2015    . rivaroxaban (XARELTO) 20 MG TABS tablet Take 1 tablet (20 mg total) by mouth daily with supper. 90 tablet 3   No current facility-administered medications for this visit.     Allergies-reviewed and updated Allergies  Allergen Reactions  . Rofecoxib     REACTION: Elevated LFT's    Social History   Social History  . Marital status: Married    Spouse name: N/A  . Number of children: N/A  . Years of education: N/A   Occupational History  . RETIRED Retired   Social History Main Topics  . Smoking status: Former Smoker    Quit date: 04/28/1966  . Smokeless tobacco: Never Used  . Alcohol use 0.6 oz/week    1 Glasses of wine per week     Comment: 1 small glass with luch and dinner  . Drug use: No  . Sexual activity: Not Currently   Other Topics Concern  . None   Social History Narrative   Married for 80 years-1st marriage for both. No kids. No pets.       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 Oropharynx normal Neck: no thyromegaly CV: irregularly irregular Lungs: CTAB no crackles, wheeze, rhonchi Abdomen: soft/nontender/nondistended/normal bowel sounds. No rebound or guarding. thin Ext: no edema Msk: hypertrophied PIP joints bilateral hands Skin: warm, dry Neuro: grossly normal, moves all extremities, PERRLA  Assessment/Plan:  82 y.o. female presenting for annual physical.  Health Maintenance counseling: 1. Anticipatory guidance: Patient counseled regarding regular dental exams, eye exams, wearing seatbelts.  2. Risk factor reduction:   Advised patient of need for regular exercise and diet rich and fruits and vegetables to reduce risk of heart attack and stroke. Exercise down with back issues. Weight reasonable.  3. Immunizations/screenings/ancillary studies Immunization History  Administered Date(s) Administered  . Influenza Split 12/17/2010, 12/02/2011  . Influenza Whole 03/17/2001, 12/18/2006, 12/28/2007, 01/03/2009, 12/26/2009  . Influenza, High Dose Seasonal PF 12/22/2012, 03/06/2015, 11/26/2015  . Influenza,inj,Quad PF,36+ Mos 11/23/2013  . Pneumococcal Conjugate-13 03/02/2014  . Pneumococcal Polysaccharide-23 03/17/2000, 09/16/2006  . Td 03/18/1995, 09/15/2006  . Tdap 12/17/2010  4. Cervical  cancer screening- passed age based screening 5. Breast cancer screening-  Mammogram last year- she opts to continue for now for dense breast tissue 6. Colon cancer screening - no further colonoscopies per GI 7. Skin cancer screening- Dr. Allyson Sabal intermittently  Status of chronic or acute concerns  History of TIA- on xarelto and atorvastatin- no recurrence  History of mitral valve repair  A fib- on digoxin, xarelto, metoprolol doing well. Stays In a fib  Depression- reasonable control on lexapro. PHQ9 0.   Osteopenia- dexa 2010- discussed repeat has set up with [facility]  Hyperlipidemia- discussed repeating lipids today on atorvastatin 20mg   Hypothyroidism- update tsh, controlled on levothyroxine 75 mcg Lab Results  Component Value Date   TSH 0.77 09/04/2015   HTN- up today initially, on repeat controlled on metoprolol  CKD III- update GFR  Dr. Nelva Bush- injections 2017. Sciatica. Interpret sugar with this in mind. Some relief with most recent injection.   Had been walking but cannot at present due to pain in her right leg. 2 miles a day previously.   Severe OA hands- voltaren gel helps some. Etodolac in evenings- never had rectal bleeding- aware of risks for bleeding and kidneys. Had been on twice a day.   Return in  about 6 months (around 09/03/2016) for follow up- or sooner if needed.  Orders Placed This Encounter  Procedures  . CBC with Differential/Platelet  . Comprehensive metabolic panel    [place]    Order Specific Question:   Has the patient fasted?    Answer:   No  . Lipid panel    [place]    Order Specific Question:   Has the patient fasted?    Answer:   No  . TSH    [place]   Meds ordered this encounter  Medications  . diclofenac sodium (VOLTAREN) 1 % GEL    Sig: Apply 2 g topically 3 (three) times daily as needed.    Dispense:  100 g    Refill:  3    Return precautions advised.  Garret Reddish, MD

## 2016-03-05 NOTE — Progress Notes (Signed)
Pre visit review using our clinic review tool, if applicable. No additional management support is needed unless otherwise documented below in the visit note. 

## 2016-03-12 DIAGNOSIS — H353122 Nonexudative age-related macular degeneration, left eye, intermediate dry stage: Secondary | ICD-10-CM | POA: Diagnosis not present

## 2016-03-12 DIAGNOSIS — H353112 Nonexudative age-related macular degeneration, right eye, intermediate dry stage: Secondary | ICD-10-CM | POA: Diagnosis not present

## 2016-03-18 ENCOUNTER — Other Ambulatory Visit: Payer: Self-pay | Admitting: Family Medicine

## 2016-03-18 DIAGNOSIS — Z1231 Encounter for screening mammogram for malignant neoplasm of breast: Secondary | ICD-10-CM | POA: Diagnosis not present

## 2016-03-18 LAB — HM MAMMOGRAPHY

## 2016-03-19 ENCOUNTER — Encounter: Payer: Self-pay | Admitting: Family Medicine

## 2016-03-24 DIAGNOSIS — M5416 Radiculopathy, lumbar region: Secondary | ICD-10-CM | POA: Diagnosis not present

## 2016-03-27 ENCOUNTER — Other Ambulatory Visit: Payer: Self-pay

## 2016-03-27 DIAGNOSIS — M5416 Radiculopathy, lumbar region: Secondary | ICD-10-CM | POA: Diagnosis not present

## 2016-03-27 MED ORDER — METOPROLOL TARTRATE 100 MG PO TABS: 100.0000 mg | ORAL_TABLET | Freq: Two times a day (BID) | ORAL | 2 refills | Status: DC

## 2016-04-01 DIAGNOSIS — M5416 Radiculopathy, lumbar region: Secondary | ICD-10-CM | POA: Diagnosis not present

## 2016-04-08 DIAGNOSIS — M5416 Radiculopathy, lumbar region: Secondary | ICD-10-CM | POA: Diagnosis not present

## 2016-04-17 DIAGNOSIS — M5416 Radiculopathy, lumbar region: Secondary | ICD-10-CM | POA: Diagnosis not present

## 2016-05-01 DIAGNOSIS — M5416 Radiculopathy, lumbar region: Secondary | ICD-10-CM | POA: Diagnosis not present

## 2016-05-01 DIAGNOSIS — Z981 Arthrodesis status: Secondary | ICD-10-CM | POA: Diagnosis not present

## 2016-05-01 DIAGNOSIS — M4150 Other secondary scoliosis, site unspecified: Secondary | ICD-10-CM | POA: Diagnosis not present

## 2016-05-15 DIAGNOSIS — H348322 Tributary (branch) retinal vein occlusion, left eye, stable: Secondary | ICD-10-CM | POA: Diagnosis not present

## 2016-05-15 DIAGNOSIS — H353132 Nonexudative age-related macular degeneration, bilateral, intermediate dry stage: Secondary | ICD-10-CM | POA: Diagnosis not present

## 2016-05-15 DIAGNOSIS — H35372 Puckering of macula, left eye: Secondary | ICD-10-CM | POA: Diagnosis not present

## 2016-06-06 ENCOUNTER — Telehealth: Payer: Self-pay | Admitting: Family Medicine

## 2016-06-06 NOTE — Telephone Encounter (Signed)
Pt is scheduled °

## 2016-06-06 NOTE — Telephone Encounter (Signed)
Called home number listed and someone answered but couldn't get them to talk. She needs to be scheduled for an appointment and labs can be determined if needed at the time of visit.

## 2016-06-06 NOTE — Telephone Encounter (Signed)
Pt would like to know if Dr. Yong Channel would like to have labs done she would like to come in due to her balance what is Dr. Ronney Lion recommendation?

## 2016-06-07 ENCOUNTER — Other Ambulatory Visit: Payer: Self-pay | Admitting: Family Medicine

## 2016-06-10 ENCOUNTER — Encounter: Payer: Self-pay | Admitting: Family Medicine

## 2016-06-10 ENCOUNTER — Ambulatory Visit (INDEPENDENT_AMBULATORY_CARE_PROVIDER_SITE_OTHER): Payer: Medicare Other | Admitting: Family Medicine

## 2016-06-10 VITALS — BP 140/86 | HR 69 | Ht 62.0 in | Wt 104.8 lb

## 2016-06-10 DIAGNOSIS — R2689 Other abnormalities of gait and mobility: Secondary | ICD-10-CM

## 2016-06-10 DIAGNOSIS — R5383 Other fatigue: Secondary | ICD-10-CM | POA: Diagnosis not present

## 2016-06-10 LAB — CBC WITH DIFFERENTIAL/PLATELET
BASOS PCT: 1.1 % (ref 0.0–3.0)
Basophils Absolute: 0.1 10*3/uL (ref 0.0–0.1)
EOS PCT: 4.1 % (ref 0.0–5.0)
Eosinophils Absolute: 0.3 10*3/uL (ref 0.0–0.7)
HEMATOCRIT: 44.7 % (ref 36.0–46.0)
Hemoglobin: 15 g/dL (ref 12.0–15.0)
LYMPHS PCT: 23.4 % (ref 12.0–46.0)
Lymphs Abs: 1.4 10*3/uL (ref 0.7–4.0)
MCHC: 33.7 g/dL (ref 30.0–36.0)
MCV: 96.9 fl (ref 78.0–100.0)
MONOS PCT: 10 % (ref 3.0–12.0)
Monocytes Absolute: 0.6 10*3/uL (ref 0.1–1.0)
NEUTROS ABS: 3.8 10*3/uL (ref 1.4–7.7)
Neutrophils Relative %: 61.4 % (ref 43.0–77.0)
PLATELETS: 190 10*3/uL (ref 150.0–400.0)
RBC: 4.61 Mil/uL (ref 3.87–5.11)
RDW: 13.7 % (ref 11.5–15.5)
WBC: 6.1 10*3/uL (ref 4.0–10.5)

## 2016-06-10 LAB — COMPREHENSIVE METABOLIC PANEL
ALT: 17 U/L (ref 0–35)
AST: 24 U/L (ref 0–37)
Albumin: 4.7 g/dL (ref 3.5–5.2)
Alkaline Phosphatase: 104 U/L (ref 39–117)
BILIRUBIN TOTAL: 0.9 mg/dL (ref 0.2–1.2)
BUN: 30 mg/dL — ABNORMAL HIGH (ref 6–23)
CALCIUM: 10.1 mg/dL (ref 8.4–10.5)
CHLORIDE: 101 meq/L (ref 96–112)
CO2: 32 meq/L (ref 19–32)
Creatinine, Ser: 0.9 mg/dL (ref 0.40–1.20)
GFR: 63.56 mL/min (ref >60.00)
Glucose, Bld: 91 mg/dL (ref 70–99)
POTASSIUM: 4.3 meq/L (ref 3.5–5.1)
Sodium: 139 mEq/L (ref 135–145)
Total Protein: 6.8 g/dL (ref 6.0–8.3)

## 2016-06-10 LAB — VITAMIN B12: VITAMIN B 12: 607 pg/mL (ref 211–911)

## 2016-06-10 LAB — TSH: TSH: 1.13 u[IU]/mL (ref 0.35–4.50)

## 2016-06-10 NOTE — Progress Notes (Signed)
Pre visit review using our clinic review tool, if applicable. No additional management support is needed unless otherwise documented below in the visit note. 

## 2016-06-10 NOTE — Progress Notes (Signed)
Subjective:  Theresa [name] is a 83 y.o. year old very pleasant female patient who presents for/with See problem oriented charting ROS- feels off balance most of the time- not dizzy with this most of the time. No chest pain, shortness of breath or palpitations   Past Medical History-  Patient Active Problem List   Diagnosis Date Noted  . TIA (transient ischemic attack) 03/21/2013    Priority: High  . Hx of ischemic left MCA stroke 04/08/2011    Priority: High  . Atrial fibrillation ([place]) 07/07/2007    Priority: High  . History of mitral valve repair 09/11/2006    Priority: High  . Hyperlipidemia 09/01/2014    Priority: Medium  . CKD (chronic kidney disease), stage III 03/02/2014    Priority: Medium  . Depression 11/23/2013    Priority: Medium  . Hypertension 03/04/2012    Priority: Medium  . Hypothyroidism 11/23/2006    Priority: Medium  . Osteopenia 11/23/2006    Priority: Medium  . Rosacea 11/23/2013    Priority: Low  . 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 PERSONAL HX COLONIC POLYPS 10/22/2009    Priority: Low  . CHANGE IN BOWELS 09/20/2009    Priority: Low  . GASTROESOPHAGEAL REFLUX DISEASE, MILD 06/04/2009    Priority: Low  . Raynaud's syndrome 03/06/2009    Priority: Low  . SCOLIOSIS, LUMBAR SPINE 01/03/2009    Priority: Low  . Osteoarthritis 09/11/2006    Priority: Low    Medications- reviewed and updated Current Outpatient Prescriptions  Medication Sig Dispense Refill  . atorvastatin (LIPITOR) 20 MG tablet Take 1 tablet (20 mg total) by mouth every evening. 90 tablet 3  . Calcium Carbonate-Vitamin D (CALTRATE 600+D) 600-400 MG-UNIT per tablet Take 1 tablet by mouth 2 (two) times daily.      . Cholecalciferol (VITAMIN D) 2000 UNITS CAPS Take 2,000 Units by mouth daily.     . diclofenac sodium (VOLTAREN) 1 % GEL Apply 2 g topically 3 (three) times daily as needed. 100 g 3  . digoxin (LANOXIN) 0.125 MG tablet  TAKE ONE-HALF TABLET BY MOUTH ONCE DAILY 30 tablet 5  . escitalopram (LEXAPRO) 10 MG tablet TAKE ONE TABLET BY MOUTH ONCE DAILY 30 tablet 5  . etodolac (LODINE) 400 MG tablet TAKE ONE TABLET BY MOUTH ONCE DAILY 90 tablet 2  . glucosamine-chondroitin 500-400 MG tablet Take 1 tablet by mouth 2 (two) times daily.      Marland Kitchen levothyroxine (SYNTHROID, LEVOTHROID) 75 MCG tablet TAKE ONE TABLET BY MOUTH ONCE DAILY BEFORE  BREAKFAST 90 tablet 3  . metoprolol (LOPRESSOR) 100 MG tablet Take 1 tablet (100 mg total) by mouth 2 (two) times daily. 180 tablet 2  . Multiple Vitamins-Minerals (PRESERVISION AREDS 2 PO) Take 1 tablet by mouth 2 (two) times daily. Reported on 09/04/2015    . rivaroxaban (XARELTO) 20 MG TABS tablet Take 1 tablet (20 mg total) by mouth daily with supper. 90 tablet 3   No current facility-administered medications for this visit.     Objective: BP 140/86 (BP Location: Left Arm, Patient Position: Sitting, Cuff Size: Normal)   Pulse 69   Ht 5\' 2"  (1.575 m)   Wt 104 lb 12.8 oz (47.5 kg)   SpO2 97%   BMI 19.17 kg/m  Gen: NAD, resting comfortably CV: RRR no murmurs rubs or gallops Lungs: CTAB no crackles, wheeze, rhonchi Abdomen: soft/nontender/nondistended/normal bowel sounds. Normal low weight Ext: no edema Skin: warm, dry  Neuro: CN II-XII intact, sensation (other than numbness in 1st and 2nd fingers of right hand from prior CVA) and reflexes normal throughout, 5/5 muscle strength in bilateral upper and lower extremities. Normal finger to nose. Normal rapid alternating movements. No pronator drift. Normal romberg. With walking- tends to fall towards the right slightly but able to catch herself without falls.    Assessment/Plan:  Other fatigue - Plan: CBC with Differential/Platelet, Comprehensive metabolic panel, TSH Balance problem - Plan: Vitamin B12, Ambulatory referral to Physical Therapy S: balance problems since her difficulty walking worsened after sciatica issues. Has not  been able to get back walking  Mentioned to her therapist who mentioned due to pain in right leg (has a few exercises she has at home, a few for legs but also for low back)- she tries to get off of it quickly. Related to pain from the back. Checked with optho- dry macular degeneration and not seeing as well as she used to. Feels like she has to lean on things at times to miss opportunity to fall. Husband thinks her energy is low. Gets husband to hold her hand- tends to veer off to the left but can get off direction in either left or right position.  Has not been able to start back her morning walks due to pain in the leg A/P: 83 year old with balance issues likely multifactorial (poor vision from macular degeration, deconditioning after becoming less active with right lumbar pain radiating into right leg). She has a reassuring neurological exam other than gait-On exam today she tended to lean toward the right but she states normally to the left. She was not lightheaded with these episodes.   Also has some fatigue We will get the following labs Fatigue- cbc diff, cmp, tsh Balance- b12  I advised her to use a cane and we referred her to PT for gait and balance training Arthritis in hands- does not want to use cane. Prior fracture left arm. Numbness right hand. She gives several reasons why cane might not be a good fit for her but I discussed I was more concerned about her fall risk  Orders Placed This Encounter  Procedures  . CBC with Differential/Platelet  . Comprehensive metabolic panel    [place]  . TSH    [place]  . Vitamin B12  . Ambulatory referral to Physical Therapy    Referral Priority:   Routine    Referral Type:   Physical Medicine    Referral Reason:   Specialty Services Required    Requested Specialty:   Physical Therapy    Number of Visits Requested:   1    Return precautions advised.  Garret Reddish, MD

## 2016-06-10 NOTE — Patient Instructions (Signed)
Please stop by lab before you go  We will call you within a week or two about your referral to physical therapy for gait and balance traning. If you do not hear within 3 weeks, give [place] a call.   Would advise you to use a cane to help prevent falls until balance improves

## 2016-06-24 ENCOUNTER — Ambulatory Visit: Payer: Medicare Other | Attending: Family Medicine | Admitting: Physical Therapy

## 2016-06-24 DIAGNOSIS — M6281 Muscle weakness (generalized): Secondary | ICD-10-CM

## 2016-06-24 DIAGNOSIS — M79661 Pain in right lower leg: Secondary | ICD-10-CM | POA: Insufficient documentation

## 2016-06-24 DIAGNOSIS — R296 Repeated falls: Secondary | ICD-10-CM | POA: Diagnosis not present

## 2016-06-24 DIAGNOSIS — R279 Unspecified lack of coordination: Secondary | ICD-10-CM | POA: Diagnosis not present

## 2016-06-24 NOTE — Patient Instructions (Signed)

## 2016-06-24 NOTE — Therapy (Signed)
[facility] [address], [place], [place], [address] Phone: [phone]   Fax:  [phone]  Physical Therapy Evaluation  Patient Details  Name: Theresa [name] MRN: [id number] Date of Birth: 02/01/1932 Referring Provider: Dr. Yong Channel  Encounter Date: 06/24/2016      PT End of Session - 06/24/16 1656    Visit Number 1   Date for PT Re-Evaluation 08/19/16   PT Start Time 2297   PT Stop Time 1100   PT Time Calculation (min) 45 min   Activity Tolerance Patient tolerated treatment well      Past Medical History:  Diagnosis Date  . Atrial fibrillation (HCC)    AFIB  . Collagenous colitis   . CVA (cerebral vascular accident) ([place])    a.  L parietal CVA by MRI 98/92, likely embolic;  b.  TEE by report with neg bubble study;  c.  carotid dopplers  12/12:  + plaque, no sig ICA stenosis   . History of postmenopausal HRT   . Hx of adenomatous colonic polyps   . Hypothyroidism   . MVP (mitral valve prolapse)    a. s/p MV repair at [facility] in 2008;  b. Echocardiogram 7/12: EF 50%, status post mitral valve repair with mild MR and minimal MS, mean gradient 4, moderate LAE, LA diam 55 mm; mild RAE, PASP 28-32;   Marland Kitchen Osteoarthritis   . Osteoporosis     Past Surgical History:  Procedure Laterality Date  . CARDIOVERSION  01/09/2012   Procedure: CARDIOVERSION;  Surgeon: Jolaine Artist, MD;  Location: [facility] ENDOSCOPY;  Service: Cardiovascular;  Laterality: N/A;  . CATARACT EXTRACTION    . CHOLECYSTECTOMY    . DILATION AND CURETTAGE OF UTERUS    . LUMBAR FUSION    . MITRAL VALVE REPAIR  2008  . TONSILLECTOMY      There were no vitals filed for this visit.       Subjective Assessment - 06/24/16 1021    Subjective Patient complains of decreased balance with several near falls, bumping into doorframes.  At church, my husband has to hold my hand.  Dr. Yong Channel feels it is a number of things contributing.  Had a recent problem  with right sciatica with occasional symptoms.  Went to [facility] and had a couple of injections in back with short term improvements only.  I used to walk 2 miles every morning with my husband but stopped b/c of leg pain.  Lower leg pain present close to 1 year.     Pertinent History macular degeneration; osteopenic;  Raynauds in feet;  hx of TIA/CVA with residual right finger paresthesia and affected speech;  left elbow fx as a child   Limitations Walking;House hold activities   How long can you walk comfortably? 30-40 min   Diagnostic tests MRI before injections   Patient Stated Goals I don't want to fall;  I'm not sure what to do to help my balance   Currently in Pain? Yes   Pain Score 3    Pain Location Leg   Pain Orientation Right;Lateral;Lower   Pain Type Chronic pain   Pain Radiating Towards no back pain as a rule   Pain Onset More than a month ago   Pain Frequency Intermittent   Aggravating Factors  sitting on tall toilet;  computer desk chair;  prolonged walking; prolonged standing   Pain Relieving Factors put a pillow behind leg with foot hanging off when lying down to  watch TV            [facility] PT Assessment - 06/24/16 0001      Assessment   Medical Diagnosis balance problem   Referring Provider Dr. Yong Channel   Onset Date/Surgical Date --  Fall 2017   Hand Dominance Right   Next MD Visit not scheduled   Prior Therapy Had PT at [facility] for leg pain (exercises didn't help much); what helped the most was standing extension with towel     Precautions   Precautions Fall   Precaution Comments osteopenic     Restrictions   Weight Bearing Restrictions No     Balance Screen   Has the patient fallen in the past 6 months Yes   How many times? 1  when stubbed toe on uneven sidewalk   Has the patient had a decrease in activity level because of a fear of falling?  Yes   Is the patient reluctant to leave their home because of a fear of falling?  No     Home Social research officer, government residence   Living Arrangements Spouse/significant other   Available Help at Discharge Family   Type of [place] Access Level entry   [place] - single point     Prior Function   Level of Independence Independent with basic ADLs   Vocation Retired   Leisure go to [employer], learning disability, musical performances, dinner with friends     Posture/Postural Control   Posture/Postural Control Postural limitations   Postural Limitations --   Posture Comments right scoliosis, pt reports she has lost 4 1/2 inches     AROM   Right/Left Shoulder --  UE ROM WFLs   Right/Left Hip --  LE ROM WFLS     Strength   Strength Assessment Site --  LE strength grossly 4/5     Ambulation/Gait   Gait Comments patient not using an assistive device at present     Balance   Balance Assessed --  tends to lose balance left > right lateral directions     Standardized Balance Assessment   Five times sit to stand comments  17 sec     Berg Balance Test   Sit to Stand Able to stand without using hands and stabilize independently   Standing Unsupported Able to stand safely 2 minutes   Sitting with Back Unsupported but Feet Supported on Floor or Stool Able to sit safely and securely 2 minutes   Stand to Sit Sits safely with minimal use of hands   Transfers Able to transfer safely, minor use of hands   Standing Unsupported with Eyes Closed Able to stand 10 seconds safely   Standing Ubsupported with Feet Together Able to place feet together independently and stand for 1 minute with supervision   From Standing, Reach Forward with Outstretched Arm Can reach forward >5 cm safely (2")   From Standing Position, Pick up Object from Floor Able to pick up shoe safely and easily   From Standing Position, Turn to Look Behind Over each Shoulder Turn sideways only but maintains balance   Turn 360 Degrees Needs close supervision or verbal  cueing   Standing Unsupported, Alternately Place Feet on Step/Stool Able to complete >2 steps/needs minimal assist   Standing Unsupported, One Foot in Front Able to take small step independently and hold 30 seconds   Standing on One Leg  Tries to lift leg/unable to hold 3 seconds but remains standing independently   Total Score 40     Timed Up and Go Test   Manual TUG (seconds) 20.9                           PT Education - 06/24/16 1655    Education provided Yes   Education Details fall prevention/safety;  discussion of fall risk based on balance testing;  recommend cane use full time   Person(s) Educated Patient   Methods Explanation;Handout   Comprehension Verbalized understanding          PT Short Term Goals - 06/24/16 1706      PT SHORT TERM GOAL #1   Title The patient will express understanding of fall prevention/safety awareness for home  07/22/16   Time 4   Period Weeks   Status New     PT SHORT TERM GOAL #2   Title The patient will be able to walk 1/4 mile with cane or hand-held assist from her husband   Time 4   Period Weeks   Status New     PT SHORT TERM GOAL #3   Title BERG balance score improved from 40/56 to 43/56 indicating decreased fall risk   Time 4   Period Weeks   Status New     PT SHORT TERM GOAL #4   Title Timed up and Go test improved to 17 sec    Time 4   Period Weeks   Status New     PT SHORT TERM GOAL #5   Title 5x sit to stand test improved to 15 sec indicating improved LE strength for rising from the chair without UE use   Time 4   Period Weeks   Status New           PT Long Term Goals - 06/24/16 1710      PT LONG TERM GOAL #1   Title The patient will be independent in safe self progression of HEP for further improvements in balance and LE strength  08/19/16   Time 8   Period Weeks   Status New     PT LONG TERM GOAL #2   Title The patient will be able to walk 1/2 mile with cane or hand held assist of husband    Time 8   Period Weeks   Status New     PT LONG TERM GOAL #3   Title BERG balance test improved to 46/56 indicating decreased fall risk   Time 8   Period Weeks   Status New     PT LONG TERM GOAL #4   Title Timed up and Go improved to 15 sec indicating decreased fall risk   Time 8   Period Weeks   Status New     PT LONG TERM GOAL #5   Title Improved LE strength to 4+/5 needed for greater ease rising from a chair without arms and negotiating curbs, steps in the community   Time 8   Period Weeks   Status New               Plan - 06/24/16 1656    Clinical Impression Statement Patient complains of decreased balance with several near falls, bumping into doorframes.  At church, my husband has to hold my hand.  Dr. Yong Channel feels it is a number of things contributing.  Had a recent problem with right sciatica  with occasional symptoms.  Went to [facility] and had a couple of injections in back with short term improvements only.  I used to walk 2 miles every morning with my husband but stopped b/c of leg pain.  Lower leg pain present close to 1 year.  The patient's primary goal is to improve balance and prevent falls.  Her BERG balance test indicates she is at significant risk for falls 80% and should be using a cane full-time.  Discussed results with patient but she prefers to use hand held assist from her husband.  Deficts with Timed up and Go test as well as 5x sit to stand test.  Decreased LE strength 4/5 although symmetrical.  The patient would benefit from PT to address these deficits.  She is of moderate complexity evaluation secondary to multiple body systems affected, numerous co-morbidities and evolving status (worsening over time.)   Rehab Potential Good   Clinical Impairments Affecting Rehab Potential fall risk;  osteopenia;  hx of CVA;  visual impairments; does not drive   PT Frequency 2x / week   PT Duration 8 weeks   PT Treatment/Interventions ADLs/Self Care Home  Management;Electrical Stimulation;Moist Heat;Cryotherapy;Therapeutic activities;Therapeutic exercise;Neuromuscular re-education;Patient/family education;Taping;Manual techniques   PT Next Visit Plan treatment focus on balance although patient does have right lateral leg pain as well;  do Dynamic Gait Index;  ABC scale;  standing dynamic balance ex      Patient will benefit from skilled therapeutic intervention in order to improve the following deficits and impairments:  Decreased balance, Decreased activity tolerance, Decreased strength, Difficulty walking, Pain  Visit Diagnosis: Unspecified lack of coordination - Plan: PT plan of care cert/re-cert  Repeated falls - Plan: PT plan of care cert/re-cert  Muscle weakness (generalized) - Plan: PT plan of care cert/re-cert  Pain in right lower leg - Plan: PT plan of care cert/re-cert      G-Codes - 28/31/51 1715    Functional Assessment Tool Used (Outpatient Only) Clinical judgement; BERG   Functional Limitation Mobility: Walking and moving around   Mobility: Walking and Moving Around Current Status ([id number]) At least 40 percent but less than 60 percent impaired, limited or restricted   Mobility: Walking and Moving Around Goal Status [phone]) At least 20 percent but less than 40 percent impaired, limited or restricted       Problem List Patient Active Problem List   Diagnosis Date Noted  . Hyperlipidemia 09/01/2014  . CKD (chronic kidney disease), stage III 03/02/2014  . Rosacea 11/23/2013  . Depression 11/23/2013  . TIA (transient ischemic attack) 03/21/2013  . Hypertension 03/04/2012  . 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 Hx of ischemic left MCA stroke 04/08/2011  . PERSONAL HX COLONIC POLYPS 10/22/2009  . CHANGE IN BOWELS 09/20/2009  . GASTROESOPHAGEAL REFLUX DISEASE, MILD 06/04/2009  . Raynaud's syndrome 03/06/2009  . SCOLIOSIS, LUMBAR SPINE 01/03/2009  . Atrial fibrillation ([place]) 07/07/2007  .  Hypothyroidism 11/23/2006  . Osteopenia 11/23/2006  . History of mitral valve repair 09/11/2006  . Osteoarthritis 09/11/2006   Ruben Im, PT 06/24/16 5:18 PM Phone: [phone] Fax: [phone]  Alvera Singh 06/24/2016, 5:18 PM  [facility] [address], [place], [place], [address] Phone: [phone]   Fax:  [phone]  Name: Theresa [name] MRN: [id number] Date of Birth: 1931/06/02

## 2016-07-01 ENCOUNTER — Ambulatory Visit: Payer: Medicare Other | Admitting: Physical Therapy

## 2016-07-01 DIAGNOSIS — R296 Repeated falls: Secondary | ICD-10-CM

## 2016-07-01 DIAGNOSIS — M79661 Pain in right lower leg: Secondary | ICD-10-CM

## 2016-07-01 DIAGNOSIS — R279 Unspecified lack of coordination: Secondary | ICD-10-CM

## 2016-07-01 DIAGNOSIS — M6281 Muscle weakness (generalized): Secondary | ICD-10-CM

## 2016-07-01 NOTE — Therapy (Signed)
[facility] [address], [place], [place], [address] Phone: [phone]   Fax:  [phone]  Physical Therapy Treatment  Patient Details  Name: Theresa [name] MRN: [id number] Date of Birth: Feb 19, 1932 Referring Provider: Dr. Yong Channel  Encounter Date: 07/01/2016      PT End of Session - 07/01/16 1105    Visit Number 2   Date for PT Re-Evaluation 08/19/16   PT Start Time 1696   PT Stop Time 1057   PT Time Calculation (min) 42 min   Activity Tolerance Patient tolerated treatment well      Past Medical History:  Diagnosis Date  . Atrial fibrillation (HCC)    AFIB  . Collagenous colitis   . CVA (cerebral vascular accident) ([place])    a.  L parietal CVA by MRI 78/93, likely embolic;  b.  TEE by report with neg bubble study;  c.  carotid dopplers  12/12:  + plaque, no sig ICA stenosis   . History of postmenopausal HRT   . Hx of adenomatous colonic polyps   . Hypothyroidism   . MVP (mitral valve prolapse)    a. s/p MV repair at [facility] in 2008;  b. Echocardiogram 7/12: EF 50%, status post mitral valve repair with mild MR and minimal MS, mean gradient 4, moderate LAE, LA diam 55 mm; mild RAE, PASP 28-32;   Marland Kitchen Osteoarthritis   . Osteoporosis     Past Surgical History:  Procedure Laterality Date  . CARDIOVERSION  01/09/2012   Procedure: CARDIOVERSION;  Surgeon: Jolaine Artist, MD;  Location: [facility] ENDOSCOPY;  Service: Cardiovascular;  Laterality: N/A;  . CATARACT EXTRACTION    . CHOLECYSTECTOMY    . DILATION AND CURETTAGE OF UTERUS    . LUMBAR FUSION    . MITRAL VALVE REPAIR  2008  . TONSILLECTOMY      There were no vitals filed for this visit.      Subjective Assessment - 07/01/16 1012    Subjective Denies soreness, questions or concerns from initial eval.  Patient states she lost her glasses and is relying on an older pair.   Today she is sore in back and shoulder from "doing too much at home."  " Last night  she reports she got disoriented and sat down on the floor.  Feels some discomfort in her muscles now.     Currently in Pain? Yes   Pain Score 3    Pain Location Leg   Pain Orientation Right   Pain Type Chronic pain            [facility] PT Assessment - 07/01/16 0001      Observation/Other Assessments   Activities of Balance Confidence Scale (ABC Scale)  71% confidence     Dynamic Gait Index   Level Surface Mild Impairment   Change in Gait Speed Mild Impairment   Gait with Horizontal Head Turns Mild Impairment   Gait with Vertical Head Turns Mild Impairment   Gait and Pivot Turn Moderate Impairment   Step Over Obstacle Mild Impairment   Step Around Obstacles Mild Impairment   Steps Mild Impairment   Total Score 15                     [facility] Adult PT Treatment/Exercise - 07/01/16 0001      Therapeutic Activites    Therapeutic Activities Other Therapeutic Activities   Other Therapeutic Activities sit to stand, weight shifting, climbing stairs; stepping over and  around obstacles     Neuro Re-ed    Neuro Re-ed Details  weight shifting, narrow base of support     Knee/Hip Exercises: Aerobic   Nustep L1 10 min     Knee/Hip Exercises: Standing   Heel Raises Both;1 set;10 reps   Hip ADduction AROM;Right;Left;10 reps   Hip Extension AROM;Right;Left;10 reps   Other Standing Knee Exercises weight shifting 4 ways   Other Standing Knee Exercises step taps 10x     Knee/Hip Exercises: Seated   Sit to Sand 2 sets;5 reps;without UE support                  PT Short Term Goals - 07/01/16 1437      PT SHORT TERM GOAL #1   Title The patient will express understanding of fall prevention/safety awareness for home  07/22/16   Time 4   Period Weeks   Status On-going     PT SHORT TERM GOAL #2   Title The patient will be able to walk 1/4 mile with cane or hand-held assist from her husband   Time 4   Period Weeks   Status On-going     PT SHORT TERM GOAL #3    Title BERG balance score improved from 40/56 to 43/56 indicating decreased fall risk   Time 4   Period Weeks   Status On-going     PT SHORT TERM GOAL #4   Title Timed up and Go test improved to 17 sec    Time 4   Period Weeks   Status On-going     PT SHORT TERM GOAL #5   Title 5x sit to stand test improved to 15 sec indicating improved LE strength for rising from the chair without UE use   Time 4   Period Weeks   Status On-going           PT Long Term Goals - 07/01/16 1437      PT LONG TERM GOAL #1   Title The patient will be independent in safe self progression of HEP for further improvements in balance and LE strength  08/19/16   Time 8   Period Weeks   Status On-going     PT LONG TERM GOAL #2   Title The patient will be able to walk 1/2 mile with cane or hand held assist of husband   Time 8   Period Weeks   Status On-going     PT LONG TERM GOAL #3   Title BERG balance test improved to 46/56 indicating decreased fall risk   Time 8   Period Weeks   Status On-going     PT LONG TERM GOAL #4   Title Timed up and Go improved to 15 sec indicating decreased fall risk   Time 8   Period Weeks   Status On-going     PT LONG TERM GOAL #5   Title Improved LE strength to 4+/5 needed for greater ease rising from a chair without arms and negotiating curbs, steps in the community   Time 8   Period Weeks   Status On-going               Plan - 07/01/16 1431    Clinical Impression Statement The patient has deficits as indicated with Dynamic Gait Index mostly in "minimal impairment" range.  Her Activities-Specific Balance Confidence Scale indicates a fair to good self perceived balance with household and community challenges.  Difficulty primarily with narrow base of  support and single leg balancing.  Close supervision for safety.  No major loss of balance.    Rehab Potential Good   Clinical Impairments Affecting Rehab Potential fall risk;  osteopenia;  hx of CVA;  visual  impairments; does not drive   PT Frequency 2x / week   PT Duration 8 weeks   PT Treatment/Interventions ADLs/Self Care Home Management;Electrical Stimulation;Moist Heat;Cryotherapy;Therapeutic activities;Therapeutic exercise;Neuromuscular re-education;Patient/family education;Taping;Manual techniques   PT Next Visit Plan  standing dynamic balance ex narrow base of support and single leg      Patient will benefit from skilled therapeutic intervention in order to improve the following deficits and impairments:  Decreased balance, Decreased activity tolerance, Decreased strength, Difficulty walking, Pain  Visit Diagnosis: Unspecified lack of coordination  Repeated falls  Muscle weakness (generalized)  Pain in right lower leg     Problem List Patient Active Problem List   Diagnosis Date Noted  . Hyperlipidemia 09/01/2014  . CKD (chronic kidney disease), stage III 03/02/2014  . Rosacea 11/23/2013  . Depression 11/23/2013  . TIA (transient ischemic attack) 03/21/2013  . Hypertension 03/04/2012  . 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 Hx of ischemic left MCA stroke 04/08/2011  . PERSONAL HX COLONIC POLYPS 10/22/2009  . CHANGE IN BOWELS 09/20/2009  . GASTROESOPHAGEAL REFLUX DISEASE, MILD 06/04/2009  . Raynaud's syndrome 03/06/2009  . SCOLIOSIS, LUMBAR SPINE 01/03/2009  . Atrial fibrillation ([place]) 07/07/2007  . Hypothyroidism 11/23/2006  . Osteopenia 11/23/2006  . History of mitral valve repair 09/11/2006  . Osteoarthritis 09/11/2006   Ruben Im, PT 07/01/16 2:39 PM Phone: [phone] Fax: [phone]  Alvera Singh 07/01/2016, 2:38 PM  [facility] [address], [place], [place], [address] Phone: [phone]   Fax:  [phone]  Name: Theresa [name] MRN: [id number] Date of Birth: [birth year]

## 2016-07-03 ENCOUNTER — Ambulatory Visit: Payer: Medicare Other | Admitting: Physical Therapy

## 2016-07-03 ENCOUNTER — Encounter: Payer: Self-pay | Admitting: Physical Therapy

## 2016-07-03 ENCOUNTER — Other Ambulatory Visit: Payer: Self-pay

## 2016-07-03 DIAGNOSIS — R279 Unspecified lack of coordination: Secondary | ICD-10-CM | POA: Diagnosis not present

## 2016-07-03 DIAGNOSIS — M6281 Muscle weakness (generalized): Secondary | ICD-10-CM

## 2016-07-03 DIAGNOSIS — M79661 Pain in right lower leg: Secondary | ICD-10-CM

## 2016-07-03 DIAGNOSIS — R296 Repeated falls: Secondary | ICD-10-CM

## 2016-07-03 MED ORDER — RIVAROXABAN 20 MG PO TABS: 20.0000 mg | ORAL_TABLET | Freq: Every day | ORAL | 3 refills | Status: DC

## 2016-07-03 NOTE — Therapy (Signed)
[facility] [address] [place], [place], [address] Phone: [phone]   Fax:  [phone]  Physical Therapy Treatment  Patient Details  Name: Theresa [name] MRN: [id number] Date of Birth: 1931-11-03 Referring Provider: Dr. Yong Channel  Encounter Date: 07/03/2016      PT End of Session - 07/03/16 1015    Visit Number 3   Date for PT Re-Evaluation 08/19/16   PT Start Time 1011   PT Stop Time 1053   PT Time Calculation (min) 42 min   Activity Tolerance Patient tolerated treatment well   Behavior During Therapy [facility] for tasks assessed/performed      Past Medical History:  Diagnosis Date  . Atrial fibrillation (HCC)    AFIB  . Collagenous colitis   . CVA (cerebral vascular accident) ([place])    a.  L parietal CVA by MRI 53/97, likely embolic;  b.  TEE by report with neg bubble study;  c.  carotid dopplers  12/12:  + plaque, no sig ICA stenosis   . History of postmenopausal HRT   . Hx of adenomatous colonic polyps   . Hypothyroidism   . MVP (mitral valve prolapse)    a. s/p MV repair at [facility] in 2008;  b. Echocardiogram 7/12: EF 50%, status post mitral valve repair with mild MR and minimal MS, mean gradient 4, moderate LAE, LA diam 55 mm; mild RAE, PASP 28-32;   Marland Kitchen Osteoarthritis   . Osteoporosis     Past Surgical History:  Procedure Laterality Date  . CARDIOVERSION  01/09/2012   Procedure: CARDIOVERSION;  Surgeon: Jolaine Artist, MD;  Location: [facility] ENDOSCOPY;  Service: Cardiovascular;  Laterality: N/A;  . CATARACT EXTRACTION    . CHOLECYSTECTOMY    . DILATION AND CURETTAGE OF UTERUS    . LUMBAR FUSION    . MITRAL VALVE REPAIR  2008  . TONSILLECTOMY      There were no vitals filed for this visit.      Subjective Assessment - 07/03/16 1013    Subjective Some pain in Right leg   Pertinent History macular degeneration; osteopenic;  Raynauds in feet;  hx of TIA/CVA with residual right finger paresthesia and  affected speech;  left elbow fx as a child   Limitations Walking;House hold activities   How long can you walk comfortably? 30-40 min   Diagnostic tests MRI before injections   Patient Stated Goals I don't want to fall;  I'm not sure what to do to help my balance   Currently in Pain? Yes   Pain Score 2    Pain Location Leg   Pain Orientation Right   Pain Type Chronic pain   Pain Onset More than a month ago   Pain Frequency Intermittent   Aggravating Factors  sitting on tall toilet; computer desk chair; prolonged walking; prolonged standing   Pain Relieving Factors put a pillow behind leg with foot hanging off when lying downt o watch TV                         [facility] Adult PT Treatment/Exercise - 07/03/16 0001      Therapeutic Activites    Therapeutic Activities Other Therapeutic Activities   Other Therapeutic Activities sit to stand, weight shifting, climbing stairs     Neuro Re-ed    Neuro Re-ed Details  weight shifting, narrow base of support     Exercises   Exercises Other Exercises  Other Exercises  Standing Row and Lat pull 2x10  yellow band     Knee/Hip Exercises: Aerobic   Nustep L1 10 min     Knee/Hip Exercises: Standing   Heel Raises Both;1 set;10 reps   Hip ADduction AROM;Right;Left;10 reps   Hip Extension AROM;Right;Left;10 reps   Forward Step Up Both;1 set;10 reps;Step Height: 6"   Other Standing Knee Exercises weight shifting 4 ways   Other Standing Knee Exercises step taps 10x     Knee/Hip Exercises: Seated   Long Arc Quad Strengthening;Both;2 sets;10 reps   Long Arc Quad Weight 2 lbs.   Clamshell with TheraBand Red  x20   Hamstring Curl Strengthening;Both;2 sets;10 reps   Hamstring Limitations Red tband   Sit to Sand 2 sets;5 reps;without UE support                  PT Short Term Goals - 07/01/16 1437      PT SHORT TERM GOAL #1   Title The patient will express understanding of fall prevention/safety awareness for home   07/22/16   Time 4   Period Weeks   Status On-going     PT SHORT TERM GOAL #2   Title The patient will be able to walk 1/4 mile with cane or hand-held assist from her husband   Time 4   Period Weeks   Status On-going     PT SHORT TERM GOAL #3   Title BERG balance score improved from 40/56 to 43/56 indicating decreased fall risk   Time 4   Period Weeks   Status On-going     PT SHORT TERM GOAL #4   Title Timed up and Go test improved to 17 sec    Time 4   Period Weeks   Status On-going     PT SHORT TERM GOAL #5   Title 5x sit to stand test improved to 15 sec indicating improved LE strength for rising from the chair without UE use   Time 4   Period Weeks   Status On-going           PT Long Term Goals - 07/01/16 1437      PT LONG TERM GOAL #1   Title The patient will be independent in safe self progression of HEP for further improvements in balance and LE strength  08/19/16   Time 8   Period Weeks   Status On-going     PT LONG TERM GOAL #2   Title The patient will be able to walk 1/2 mile with cane or hand held assist of husband   Time 8   Period Weeks   Status On-going     PT LONG TERM GOAL #3   Title BERG balance test improved to 46/56 indicating decreased fall risk   Time 8   Period Weeks   Status On-going     PT LONG TERM GOAL #4   Title Timed up and Go improved to 15 sec indicating decreased fall risk   Time 8   Period Weeks   Status On-going     PT LONG TERM GOAL #5   Title Improved LE strength to 4+/5 needed for greater ease rising from a chair without arms and negotiating curbs, steps in the community   Time 8   Period Weeks   Status On-going               Plan - 07/03/16 1056    Clinical Impression Statement Pt did well  with all exercises. Some decreased core stabilty with sinlge leg standing exercises but able to correct with verbal cues. Pt presents with rounded shoulders so added postural strengthening exercises as well. Pt tolerated  well. Pt will continue to benefit from skilled therapy for strengthening and balance.    Rehab Potential Good   Clinical Impairments Affecting Rehab Potential fall risk;  osteopenia;  hx of CVA;  visual impairments; does not drive   PT Frequency 2x / week   PT Duration 8 weeks   PT Treatment/Interventions ADLs/Self Care Home Management;Electrical Stimulation;Moist Heat;Cryotherapy;Therapeutic activities;Therapeutic exercise;Neuromuscular re-education;Patient/family education;Taping;Manual techniques   PT Next Visit Plan  standing dynamic balance ex narrow base of support and single leg   Consulted and Agree with Plan of Care Patient      Patient will benefit from skilled therapeutic intervention in order to improve the following deficits and impairments:  Decreased balance, Decreased activity tolerance, Decreased strength, Difficulty walking, Pain  Visit Diagnosis: Unspecified lack of coordination  Repeated falls  Muscle weakness (generalized)  Pain in right lower leg     Problem List Patient Active Problem List   Diagnosis Date Noted  . Hyperlipidemia 09/01/2014  . CKD (chronic kidney disease), stage III 03/02/2014  . Rosacea 11/23/2013  . Depression 11/23/2013  . TIA (transient ischemic attack) 03/21/2013  . Hypertension 03/04/2012  . 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 Hx of ischemic left MCA stroke 04/08/2011  . PERSONAL HX COLONIC POLYPS 10/22/2009  . CHANGE IN BOWELS 09/20/2009  . GASTROESOPHAGEAL REFLUX DISEASE, MILD 06/04/2009  . Raynaud's syndrome 03/06/2009  . SCOLIOSIS, LUMBAR SPINE 01/03/2009  . Atrial fibrillation ([place]) 07/07/2007  . Hypothyroidism 11/23/2006  . Osteopenia 11/23/2006  . History of mitral valve repair 09/11/2006  . Osteoarthritis 09/11/2006    Mikle Bosworth PTA 07/03/2016, 2:27 PM  [facility] [address], [place], [place],  [address] Phone: [phone]   Fax:  [phone]  Name: Dametra Whetsel MRN: [id number] Date of Birth: 11-24-1931

## 2016-07-08 ENCOUNTER — Encounter: Payer: Self-pay | Admitting: Physical Therapy

## 2016-07-08 ENCOUNTER — Ambulatory Visit: Payer: Medicare Other | Admitting: Physical Therapy

## 2016-07-08 DIAGNOSIS — M6281 Muscle weakness (generalized): Secondary | ICD-10-CM

## 2016-07-08 DIAGNOSIS — R279 Unspecified lack of coordination: Secondary | ICD-10-CM

## 2016-07-08 DIAGNOSIS — M79661 Pain in right lower leg: Secondary | ICD-10-CM

## 2016-07-08 DIAGNOSIS — R296 Repeated falls: Secondary | ICD-10-CM

## 2016-07-08 NOTE — Therapy (Signed)
[facility] [address], [place], [place], [address] Phone: [phone]   Fax:  [phone]  Physical Therapy Treatment  Patient Details  Name: Theresa [name] MRN: [id number] Date of Birth: April 15, 1931 Referring Provider: Dr. Yong Channel  Encounter Date: 07/08/2016      PT End of Session - 07/08/16 1018    Visit Number 4   Date for PT Re-Evaluation 08/19/16   PT Start Time 1751   PT Stop Time 1059   PT Time Calculation (min) 44 min   Activity Tolerance Patient tolerated treatment well   Behavior During Therapy [facility] for tasks assessed/performed      Past Medical History:  Diagnosis Date  . Atrial fibrillation (HCC)    AFIB  . Collagenous colitis   . CVA (cerebral vascular accident) ([place])    a.  L parietal CVA by MRI 02/58, likely embolic;  b.  TEE by report with neg bubble study;  c.  carotid dopplers  12/12:  + plaque, no sig ICA stenosis   . History of postmenopausal HRT   . Hx of adenomatous colonic polyps   . Hypothyroidism   . MVP (mitral valve prolapse)    a. s/p MV repair at [facility] in 2008;  b. Echocardiogram 7/12: EF 50%, status post mitral valve repair with mild MR and minimal MS, mean gradient 4, moderate LAE, LA diam 55 mm; mild RAE, PASP 28-32;   Marland Kitchen Osteoarthritis   . Osteoporosis     Past Surgical History:  Procedure Laterality Date  . CARDIOVERSION  01/09/2012   Procedure: CARDIOVERSION;  Surgeon: Jolaine Artist, MD;  Location: [facility] ENDOSCOPY;  Service: Cardiovascular;  Laterality: N/A;  . CATARACT EXTRACTION    . CHOLECYSTECTOMY    . DILATION AND CURETTAGE OF UTERUS    . LUMBAR FUSION    . MITRAL VALVE REPAIR  2008  . TONSILLECTOMY      There were no vitals filed for this visit.      Subjective Assessment - 07/08/16 1017    Subjective I still have the nerve pain in the right leg, it comes and goes.    Pertinent History macular degeneration; osteopenic;  Raynauds in feet;  hx of TIA/CVA  with residual right finger paresthesia and affected speech;  left elbow fx as a child   Limitations Walking;House hold activities   How long can you walk comfortably? 30-40 min   Diagnostic tests MRI before injections   Patient Stated Goals I don't want to fall;  I'm not sure what to do to help my balance   Currently in Pain? Yes   Pain Score 2    Pain Location Leg   Pain Orientation Right   Pain Type Chronic pain   Pain Radiating Towards no bakc pain as a rule   Pain Onset More than a month ago   Pain Frequency Intermittent                         [facility] Adult PT Treatment/Exercise - 07/08/16 0001      Therapeutic Activites    Therapeutic Activities Other Therapeutic Activities   Other Therapeutic Activities sit to stand, weight shifting, climbing stairs     Neuro Re-ed    Neuro Re-ed Details  weight shifting, narrow base of support     Exercises   Exercises Other Exercises   Other Exercises  Standing Row and Lat pull 2x10  yellow band  Knee/Hip Exercises: Aerobic   Nustep L1 10 min     Knee/Hip Exercises: Standing   Heel Raises Both;1 set;10 reps   Hip Flexion --  Marching   Hip ADduction AROM;Right;Left;10 reps   Hip Extension AROM;Right;Left;10 reps   Forward Step Up --   Rebounder Weight shifting 3 ways   Other Standing Knee Exercises Ladder walking  forward and side stepping x2 laps each   Other Standing Knee Exercises Lat pull, rows  x15 red band     Knee/Hip Exercises: Seated   Long Arc Quad Strengthening;Both;2 sets;10 reps   Long Arc Quad Weight 2 lbs.   Clamshell with TheraBand Red  x20   Sit to Sand 2 sets;5 reps;without UE support                  PT Short Term Goals - 07/08/16 1018      PT SHORT TERM GOAL #1   Title The patient will express understanding of fall prevention/safety awareness for home  07/22/16   Status Achieved     PT SHORT TERM GOAL #2   Title The patient will be able to walk 1/4 mile with cane or  hand-held assist from her husband   Time 4   Period Weeks   Status On-going           PT Long Term Goals - 07/01/16 1437      PT LONG TERM GOAL #1   Title The patient will be independent in safe self progression of HEP for further improvements in balance and LE strength  08/19/16   Time 8   Period Weeks   Status On-going     PT LONG TERM GOAL #2   Title The patient will be able to walk 1/2 mile with cane or hand held assist of husband   Time 8   Period Weeks   Status On-going     PT LONG TERM GOAL #3   Title BERG balance test improved to 46/56 indicating decreased fall risk   Time 8   Period Weeks   Status On-going     PT LONG TERM GOAL #4   Title Timed up and Go improved to 15 sec indicating decreased fall risk   Time 8   Period Weeks   Status On-going     PT LONG TERM GOAL #5   Title Improved LE strength to 4+/5 needed for greater ease rising from a chair without arms and negotiating curbs, steps in the community   Time 8   Period Weeks   Status On-going               Plan - 07/08/16 1125    Clinical Impression Statement Pt continues to have global weakness and decreased balance. Pt able to copmlete ladder walking exercises needing Min assist. Pt continues to have difficulty with single leg stance due to hip and core weakness. Pt will continue to benefit from skilled therapy for strengthening and balance progression.    Rehab Potential Good   Clinical Impairments Affecting Rehab Potential fall risk;  osteopenia;  hx of CVA;  visual impairments; does not drive   PT Frequency 2x / week   PT Duration 8 weeks   PT Treatment/Interventions ADLs/Self Care Home Management;Electrical Stimulation;Moist Heat;Cryotherapy;Therapeutic activities;Therapeutic exercise;Neuromuscular re-education;Patient/family education;Taping;Manual techniques   PT Next Visit Plan  standing dynamic balance ex narrow base of support and single leg   Consulted and Agree with Plan of Care  Patient  Patient will benefit from skilled therapeutic intervention in order to improve the following deficits and impairments:  Decreased balance, Decreased activity tolerance, Decreased strength, Difficulty walking, Pain  Visit Diagnosis: Unspecified lack of coordination  Repeated falls  Muscle weakness (generalized)  Pain in right lower leg     Problem List Patient Active Problem List   Diagnosis Date Noted  . Hyperlipidemia 09/01/2014  . CKD (chronic kidney disease), stage III 03/02/2014  . Rosacea 11/23/2013  . Depression 11/23/2013  . TIA (transient ischemic attack) 03/21/2013  . Hypertension 03/04/2012  . 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 Hx of ischemic left MCA stroke 04/08/2011  . PERSONAL HX COLONIC POLYPS 10/22/2009  . CHANGE IN BOWELS 09/20/2009  . GASTROESOPHAGEAL REFLUX DISEASE, MILD 06/04/2009  . Raynaud's syndrome 03/06/2009  . SCOLIOSIS, LUMBAR SPINE 01/03/2009  . Atrial fibrillation ([place]) 07/07/2007  . Hypothyroidism 11/23/2006  . Osteopenia 11/23/2006  . History of mitral valve repair 09/11/2006  . Osteoarthritis 09/11/2006    Mikle Bosworth PTA 07/08/2016, 11:27 AM  [facility] [address] [place], [place], [address] Phone: [phone]   Fax:  [phone]  Name: Yitta Gongaware MRN: [id number] Date of Birth: December 26, 1931

## 2016-07-10 ENCOUNTER — Encounter: Payer: Self-pay | Admitting: Physical Therapy

## 2016-07-10 ENCOUNTER — Ambulatory Visit: Payer: Medicare Other | Admitting: Physical Therapy

## 2016-07-10 DIAGNOSIS — M6281 Muscle weakness (generalized): Secondary | ICD-10-CM

## 2016-07-10 DIAGNOSIS — R296 Repeated falls: Secondary | ICD-10-CM

## 2016-07-10 DIAGNOSIS — M79661 Pain in right lower leg: Secondary | ICD-10-CM

## 2016-07-10 DIAGNOSIS — R279 Unspecified lack of coordination: Secondary | ICD-10-CM

## 2016-07-10 NOTE — Therapy (Signed)
[facility] [address], [place], [place], [address] Phone: [phone]   Fax:  [phone]  Physical Therapy Treatment  Patient Details  Name: Theresa [name] MRN: [id number] Date of Birth: [birth year] Referring Provider: Dr. Yong Channel  Encounter Date: 07/10/2016      PT End of Session - 07/10/16 1017    Visit Number 5   Date for PT Re-Evaluation 08/19/16   PT Start Time 0300   PT Stop Time 1053   PT Time Calculation (min) 38 min   Activity Tolerance Patient tolerated treatment well   Behavior During Therapy [facility] for tasks assessed/performed      Past Medical History:  Diagnosis Date  . Atrial fibrillation (HCC)    AFIB  . Collagenous colitis   . CVA (cerebral vascular accident) ([place])    a.  L parietal CVA by MRI 92/33, likely embolic;  b.  TEE by report with neg bubble study;  c.  carotid dopplers  12/12:  + plaque, no sig ICA stenosis   . History of postmenopausal HRT   . Hx of adenomatous colonic polyps   . Hypothyroidism   . MVP (mitral valve prolapse)    a. s/p MV repair at [facility] in 2008;  b. Echocardiogram 7/12: EF 50%, status post mitral valve repair with mild MR and minimal MS, mean gradient 4, moderate LAE, LA diam 55 mm; mild RAE, PASP 28-32;   Marland Kitchen Osteoarthritis   . Osteoporosis     Past Surgical History:  Procedure Laterality Date  . CARDIOVERSION  01/09/2012   Procedure: CARDIOVERSION;  Surgeon: Jolaine Artist, MD;  Location: [facility] ENDOSCOPY;  Service: Cardiovascular;  Laterality: N/A;  . CATARACT EXTRACTION    . CHOLECYSTECTOMY    . DILATION AND CURETTAGE OF UTERUS    . LUMBAR FUSION    . MITRAL VALVE REPAIR  2008  . TONSILLECTOMY      There were no vitals filed for this visit.      Subjective Assessment - 07/10/16 1016    Subjective I been having more pain in my leg down to the ankle. Not sure if it's nerve pain or not.    Pertinent History macular degeneration; osteopenic;  Raynauds  in feet;  hx of TIA/CVA with residual right finger paresthesia and affected speech;  left elbow fx as a child   Limitations Walking;House hold activities   How long can you walk comfortably? 30-40 min   Diagnostic tests MRI before injections   Patient Stated Goals I don't want to fall;  I'm not sure what to do to help my balance   Currently in Pain? Yes   Pain Score 3    Pain Location Leg   Pain Orientation Right   Pain Type Chronic pain                         [facility] Adult PT Treatment/Exercise - 07/10/16 0001      Therapeutic Activites    Therapeutic Activities Other Therapeutic Activities   Other Therapeutic Activities sit to stand, weight shifting, climbing stairs     Neuro Re-ed    Neuro Re-ed Details  weight shifting, narrow base of support     Exercises   Exercises Other Exercises   Other Exercises  Standing Row and Lat pull 2x10  yellow band     Knee/Hip Exercises: Aerobic   Nustep L2 10 min     Knee/Hip Exercises: Standing  Heel Raises Both;1 set;10 reps   Hip Flexion --  Marching   Hip ADduction AROM;Right;Left;10 reps   Hip Extension AROM;Right;Left;10 reps   Other Standing Knee Exercises Ladder walking  forward and side stepping x2 laps each     Knee/Hip Exercises: Seated   Long Arc Quad Strengthening;Both;2 sets;10 reps   Long Arc Quad Weight 3 lbs.   Clamshell with TheraBand Red  x20   Sit to Sand 2 sets;5 reps;without UE support                  PT Short Term Goals - 07/08/16 1018      PT SHORT TERM GOAL #1   Title The patient will express understanding of fall prevention/safety awareness for home  07/22/16   Status Achieved     PT SHORT TERM GOAL #2   Title The patient will be able to walk 1/4 mile with cane or hand-held assist from her husband   Time 4   Period Weeks   Status On-going           PT Long Term Goals - 07/01/16 1437      PT LONG TERM GOAL #1   Title The patient will be independent in safe self  progression of HEP for further improvements in balance and LE strength  08/19/16   Time 8   Period Weeks   Status On-going     PT LONG TERM GOAL #2   Title The patient will be able to walk 1/2 mile with cane or hand held assist of husband   Time 8   Period Weeks   Status On-going     PT LONG TERM GOAL #3   Title BERG balance test improved to 46/56 indicating decreased fall risk   Time 8   Period Weeks   Status On-going     PT LONG TERM GOAL #4   Title Timed up and Go improved to 15 sec indicating decreased fall risk   Time 8   Period Weeks   Status On-going     PT LONG TERM GOAL #5   Title Improved LE strength to 4+/5 needed for greater ease rising from a chair without arms and negotiating curbs, steps in the community   Time 8   Period Weeks   Status On-going               Plan - 07/10/16 1110    Clinical Impression Statement Patient did well with all strengthening and standing exercises today. Patient continues to be challegned with balance exercises but progressing well. Patient stated she seems to be doing better with getting aorund house. Patient is hoping to get her new glasses soon which will help with balance due to improved vision. Patient will continue to benefit from skilled therapy for strengthening and stability needed for balance and safety.    Rehab Potential Good   Clinical Impairments Affecting Rehab Potential fall risk;  osteopenia;  hx of CVA;  visual impairments; does not drive   PT Frequency 2x / week   PT Duration 8 weeks   PT Treatment/Interventions ADLs/Self Care Home Management;Electrical Stimulation;Moist Heat;Cryotherapy;Therapeutic activities;Therapeutic exercise;Neuromuscular re-education;Patient/family education;Taping;Manual techniques   PT Next Visit Plan  standing dynamic balance ex narrow base of support and single leg   Consulted and Agree with Plan of Care Patient      Patient will benefit from skilled therapeutic intervention in  order to improve the following deficits and impairments:  Decreased balance, Decreased activity tolerance,  Decreased strength, Difficulty walking, Pain  Visit Diagnosis: Unspecified lack of coordination  Repeated falls  Muscle weakness (generalized)  Pain in right lower leg     Problem List Patient Active Problem List   Diagnosis Date Noted  . Hyperlipidemia 09/01/2014  . CKD (chronic kidney disease), stage III 03/02/2014  . Rosacea 11/23/2013  . Depression 11/23/2013  . TIA (transient ischemic attack) 03/21/2013  . Hypertension 03/04/2012  . 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 Hx of ischemic left MCA stroke 04/08/2011  . PERSONAL HX COLONIC POLYPS 10/22/2009  . CHANGE IN BOWELS 09/20/2009  . GASTROESOPHAGEAL REFLUX DISEASE, MILD 06/04/2009  . Raynaud's syndrome 03/06/2009  . SCOLIOSIS, LUMBAR SPINE 01/03/2009  . Atrial fibrillation ([place]) 07/07/2007  . Hypothyroidism 11/23/2006  . Osteopenia 11/23/2006  . History of mitral valve repair 09/11/2006  . Osteoarthritis 09/11/2006    Mikle Bosworth PTA 07/10/2016, 2:13 PM  [facility] [address] [place], [place], [address] Phone: [phone]   Fax:  [phone]  Name: Theresa [name] MRN: [id number] Date of Birth: Apr 04, 1931

## 2016-07-15 ENCOUNTER — Encounter: Payer: Self-pay | Admitting: Physical Therapy

## 2016-07-15 ENCOUNTER — Ambulatory Visit: Payer: Medicare Other | Attending: Family Medicine | Admitting: Physical Therapy

## 2016-07-15 DIAGNOSIS — R279 Unspecified lack of coordination: Secondary | ICD-10-CM | POA: Diagnosis not present

## 2016-07-15 DIAGNOSIS — R296 Repeated falls: Secondary | ICD-10-CM | POA: Insufficient documentation

## 2016-07-15 DIAGNOSIS — M79661 Pain in right lower leg: Secondary | ICD-10-CM | POA: Insufficient documentation

## 2016-07-15 DIAGNOSIS — M6281 Muscle weakness (generalized): Secondary | ICD-10-CM | POA: Insufficient documentation

## 2016-07-15 NOTE — Therapy (Signed)
[facility] [address], [place], [place], [address] Phone: [phone]   Fax:  [phone]  Physical Therapy Treatment  Patient Details  Name: Theresa [name] MRN: [id number] Date of Birth: [birth year] Referring Provider: Dr. Yong Channel  Encounter Date: 07/15/2016      PT End of Session - 07/15/16 1023    Visit Number 6   Date for PT Re-Evaluation 08/19/16   PT Start Time 2778   PT Stop Time 1056   PT Time Calculation (min) 41 min   Activity Tolerance Patient tolerated treatment well   Behavior During Therapy [facility] for tasks assessed/performed      Past Medical History:  Diagnosis Date  . Atrial fibrillation (HCC)    AFIB  . Collagenous colitis   . CVA (cerebral vascular accident) ([place])    a.  L parietal CVA by MRI 24/23, likely embolic;  b.  TEE by report with neg bubble study;  c.  carotid dopplers  12/12:  + plaque, no sig ICA stenosis   . History of postmenopausal HRT   . Hx of adenomatous colonic polyps   . Hypothyroidism   . MVP (mitral valve prolapse)    a. s/p MV repair at [facility] in 2008;  b. Echocardiogram 7/12: EF 50%, status post mitral valve repair with mild MR and minimal MS, mean gradient 4, moderate LAE, LA diam 55 mm; mild RAE, PASP 28-32;   Marland Kitchen Osteoarthritis   . Osteoporosis     Past Surgical History:  Procedure Laterality Date  . CARDIOVERSION  01/09/2012   Procedure: CARDIOVERSION;  Surgeon: Jolaine Artist, MD;  Location: [facility];  Service: Cardiovascular;  Laterality: N/A;  . CATARACT EXTRACTION    . CHOLECYSTECTOMY    . DILATION AND CURETTAGE OF UTERUS    . LUMBAR FUSION    . MITRAL VALVE REPAIR  2008  . TONSILLECTOMY      There were no vitals filed for this visit.      Subjective Assessment - 07/15/16 1019    Subjective The pain down my leg has been getting worse and it's more towards my ankle.    Pertinent History macular degeneration; osteopenic;  Raynauds in feet;  hx  of TIA/CVA with residual right finger paresthesia and affected speech;  left elbow fx as a child   Limitations Walking;House hold activities   How long can you walk comfortably? 30-40 min   Diagnostic tests MRI before injections   Patient Stated Goals I don't want to fall;  I'm not sure what to do to help my balance   Currently in Pain? Yes   Pain Score 3    Pain Location Leg   Pain Orientation Right   Pain Type Chronic pain                         [facility] Adult PT Treatment/Exercise - 07/15/16 0001      Therapeutic Activites    Therapeutic Activities Other Therapeutic Activities   Other Therapeutic Activities sit to stand, weight shifting, climbing stairs     Neuro Re-ed    Neuro Re-ed Details  weight shifting, narrow base of support     Knee/Hip Exercises: Aerobic   Nustep L2 6 min     Knee/Hip Exercises: Standing   Hip Flexion --  Marching   Hip ADduction AROM;Right;Left;10 reps   Hip Extension AROM;Right;Left;10 reps   Forward Step Up Both;1 set;10 reps;Step Height: 6"  Other Standing Knee Exercises Ladder walking  forward and side stepping x2 laps each     Knee/Hip Exercises: Seated   Long Arc Quad Strengthening;Both;2 sets;10 reps   Long Arc Quad Weight 4 lbs.   Ball Squeeze x20   Supine   Clamshell with TheraBand --  Supine bridge x20   Hamstring Curl Strengthening;Both;2 sets;10 reps   Hamstring Limitations Red tband   Sit to Sand 2 sets;5 reps;without UE support     Manual Therapy   Manual Therapy Soft tissue mobilization   Soft tissue mobilization Rt anterior tibialis at shin and ankle                  PT Short Term Goals - 07/08/16 1018      PT SHORT TERM GOAL #1   Title The patient will express understanding of fall prevention/safety awareness for home  07/22/16   Status Achieved     PT SHORT TERM GOAL #2   Title The patient will be able to walk 1/4 mile with cane or hand-held assist from her husband   Time 4   Period  Weeks   Status On-going           PT Long Term Goals - 07/01/16 1437      PT LONG TERM GOAL #1   Title The patient will be independent in safe self progression of HEP for further improvements in balance and LE strength  08/19/16   Time 8   Period Weeks   Status On-going     PT LONG TERM GOAL #2   Title The patient will be able to walk 1/2 mile with cane or hand held assist of husband   Time 8   Period Weeks   Status On-going     PT LONG TERM GOAL #3   Title BERG balance test improved to 46/56 indicating decreased fall risk   Time 8   Period Weeks   Status On-going     PT LONG TERM GOAL #4   Title Timed up and Go improved to 15 sec indicating decreased fall risk   Time 8   Period Weeks   Status On-going     PT LONG TERM GOAL #5   Title Improved LE strength to 4+/5 needed for greater ease rising from a chair without arms and negotiating curbs, steps in the community   Time 8   Period Weeks   Status On-going               Plan - 07/15/16 1046    Clinical Impression Statement Patient reports increased pain in Rt lateral ankle and was having pain while on Nustep. Manual soft tissue mobilization to anterior tibialis. Tenderness around lateral maleaous. Patient able to toelrate all supine and standing exercises well.  Continues to be unsteady with balance exercises needing Min assist. Patient posture has improved. Patient will continue to benefit from skilled therapy for balance and core strengthening.    Rehab Potential Good   Clinical Impairments Affecting Rehab Potential fall risk;  osteopenia;  hx of CVA;  visual impairments; does not drive   PT Frequency 2x / week   PT Duration 8 weeks   PT Treatment/Interventions ADLs/Self Care Home Management;Electrical Stimulation;Moist Heat;Cryotherapy;Therapeutic activities;Therapeutic exercise;Neuromuscular re-education;Patient/family education;Taping;Manual techniques   PT Next Visit Plan  standing dynamic balance ex narrow  base of support and single leg   Consulted and Agree with Plan of Care Patient      Patient will benefit from  skilled therapeutic intervention in order to improve the following deficits and impairments:  Decreased balance, Decreased activity tolerance, Decreased strength, Difficulty walking, Pain  Visit Diagnosis: Unspecified lack of coordination  Repeated falls  Muscle weakness (generalized)  Pain in right lower leg     Problem List Patient Active Problem List   Diagnosis Date Noted  . Hyperlipidemia 09/01/2014  . CKD (chronic kidney disease), stage III 03/02/2014  . Rosacea 11/23/2013  . Depression 11/23/2013  . TIA (transient ischemic attack) 03/21/2013  . Hypertension 03/04/2012  . 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 Hx of ischemic left MCA stroke 04/08/2011  . PERSONAL HX COLONIC POLYPS 10/22/2009  . CHANGE IN BOWELS 09/20/2009  . GASTROESOPHAGEAL REFLUX DISEASE, MILD 06/04/2009  . Raynaud's syndrome 03/06/2009  . SCOLIOSIS, LUMBAR SPINE 01/03/2009  . Atrial fibrillation ([place]) 07/07/2007  . Hypothyroidism 11/23/2006  . Osteopenia 11/23/2006  . History of mitral valve repair 09/11/2006  . Osteoarthritis 09/11/2006    Mikle Bosworth PTA 07/15/2016, 3:01 PM  [facility] [address], [place], [place], [address] Phone: [phone]   Fax:  [phone]  Name: Theresa [name] MRN: [id number] Date of Birth: [birth year]

## 2016-07-17 ENCOUNTER — Ambulatory Visit: Payer: Medicare Other | Admitting: Physical Therapy

## 2016-07-17 DIAGNOSIS — M79661 Pain in right lower leg: Secondary | ICD-10-CM

## 2016-07-17 DIAGNOSIS — R279 Unspecified lack of coordination: Secondary | ICD-10-CM

## 2016-07-17 DIAGNOSIS — M6281 Muscle weakness (generalized): Secondary | ICD-10-CM

## 2016-07-17 DIAGNOSIS — R296 Repeated falls: Secondary | ICD-10-CM

## 2016-07-17 NOTE — Therapy (Signed)
[facility] [address], [place], [place], [address] Phone: [phone]   Fax:  [phone]  Physical Therapy Treatment  Patient Details  Name: Theresa [name] MRN: [id number] Date of Birth: 1931/03/19 Referring Provider: Dr. Yong Channel  Encounter Date: 07/17/2016      PT End of Session - 07/17/16 1016    Visit Number 7   Date for PT Re-Evaluation 08/19/16   PT Start Time 1014   PT Stop Time 1053   PT Time Calculation (min) 39 min   Activity Tolerance Patient tolerated treatment well   Behavior During Therapy [facility] for tasks assessed/performed      Past Medical History:  Diagnosis Date  . Atrial fibrillation (HCC)    AFIB  . Collagenous colitis   . CVA (cerebral vascular accident) ([place])    a.  L parietal CVA by MRI 72/53, likely embolic;  b.  TEE by report with neg bubble study;  c.  carotid dopplers  12/12:  + plaque, no sig ICA stenosis   . History of postmenopausal HRT   . Hx of adenomatous colonic polyps   . Hypothyroidism   . MVP (mitral valve prolapse)    a. s/p MV repair at [facility] in 2008;  b. Echocardiogram 7/12: EF 50%, status post mitral valve repair with mild MR and minimal MS, mean gradient 4, moderate LAE, LA diam 55 mm; mild RAE, PASP 28-32;   Marland Kitchen Osteoarthritis   . Osteoporosis     Past Surgical History:  Procedure Laterality Date  . CARDIOVERSION  01/09/2012   Procedure: CARDIOVERSION;  Surgeon: Jolaine Artist, MD;  Location: [facility] ENDOSCOPY;  Service: Cardiovascular;  Laterality: N/A;  . CATARACT EXTRACTION    . CHOLECYSTECTOMY    . DILATION AND CURETTAGE OF UTERUS    . LUMBAR FUSION    . MITRAL VALVE REPAIR  2008  . TONSILLECTOMY      There were no vitals filed for this visit.      Subjective Assessment - 07/17/16 1017    Subjective I over did it yesterday, I had to put away the winter things and get out the summer things so I did a lot of standing. My ankle feels about the same.  Balance seems to be doing a little better.   Pertinent History macular degeneration; osteopenic;  Raynauds in feet;  hx of TIA/CVA with residual right finger paresthesia and affected speech;  left elbow fx as a child   Limitations Walking;House hold activities   How long can you walk comfortably? 30-40 min   Diagnostic tests MRI before injections   Patient Stated Goals I don't want to fall;  I'm not sure what to do to help my balance   Currently in Pain? Yes   Pain Score 3    Pain Location Leg   Pain Orientation Right   Pain Type Chronic pain            [facility] PT Assessment - 07/17/16 0001      Berg Balance Test   Sit to Stand Able to stand without using hands and stabilize independently   Standing Unsupported Able to stand safely 2 minutes   Sitting with Back Unsupported but Feet Supported on Floor or Stool Able to sit safely and securely 2 minutes   Stand to Sit Sits safely with minimal use of hands   Transfers Able to transfer safely, minor use of hands   Standing Unsupported with Eyes Closed Able to stand 10  seconds safely   Standing Ubsupported with Feet Together Able to place feet together independently and stand 1 minute safely   From Standing, Reach Forward with Outstretched Arm Can reach forward >12 cm safely (5")   From Standing Position, Pick up Object from [place] to pick up shoe safely and easily   From Standing Position, Turn to Look Behind Over each Shoulder Looks behind from both sides and weight shifts well   Turn 360 Degrees Able to turn 360 degrees safely in 4 seconds or less   Standing Unsupported, Alternately Place Feet on Step/Stool Able to complete 4 steps without aid or supervision   Standing Unsupported, One Foot in [place] to take small step independently and hold 30 seconds   Standing on One Leg Tries to lift leg/unable to hold 3 seconds but remains standing independently   Total Score 48     Timed Up and Go Test   Manual TUG (seconds) 12                      [facility] Adult PT Treatment/Exercise - 07/17/16 0001      Therapeutic Activites    Therapeutic Activities Other Therapeutic Activities   Other Therapeutic Activities sit to stand, weight shifting, climbing stairs     Neuro Re-ed    Neuro Re-ed Details  weight shifting, narrow base of support     Knee/Hip Exercises: Aerobic   Nustep L2 10 min  Therapist present to discuss treatment     Knee/Hip Exercises: Standing   Knee Flexion AROM;Both;2 sets;10 reps  #2   Hip ADduction AROM;Right;Left;10 reps  #2   Hip Extension AROM;Right;Left;10 reps  #2   Forward Step Up Both;1 set;10 reps;Step Height: 6"   Rebounder Weight shifting 3 ways     Knee/Hip Exercises: Seated   Long Arc Quad Strengthening;Both;2 sets;10 reps   Long Arc Quad Weight 4 lbs.   Sit to Sand 2 sets;5 reps;without UE support                  PT Short Term Goals - 07/17/16 1025      PT SHORT TERM GOAL #2   Title The patient will be able to walk 1/4 mile with cane or hand-held assist from her husband   Baseline Not attempted yet   Time 4   Period Weeks   Status On-going     PT SHORT TERM GOAL #3   Title BERG balance score improved from 40/56 to 43/56 indicating decreased fall risk   Baseline 48   Status Achieved     PT SHORT TERM GOAL #4   Title Timed up and Go test improved to 17 sec    Baseline 12   Status Achieved     PT SHORT TERM GOAL #5   Title 5x sit to stand test improved to 15 sec indicating improved LE strength for rising from the chair without UE use   Baseline 18 seconds (07/17/16)   Time 4   Period Weeks   Status On-going           PT Long Term Goals - 07/01/16 1437      PT LONG TERM GOAL #1   Title The patient will be independent in safe self progression of HEP for further improvements in balance and LE strength  08/19/16   Time 8   Period Weeks   Status On-going     PT LONG TERM GOAL #2   Title  The patient will be able to walk 1/2 mile with  cane or hand held assist of husband   Time 8   Period Weeks   Status On-going     PT LONG TERM GOAL #3   Title BERG balance test improved to 46/56 indicating decreased fall risk   Time 8   Period Weeks   Status On-going     PT LONG TERM GOAL #4   Title Timed up and Go improved to 15 sec indicating decreased fall risk   Time 8   Period Weeks   Status On-going     PT LONG TERM GOAL #5   Title Improved LE strength to 4+/5 needed for greater ease rising from a chair without arms and negotiating curbs, steps in the community   Time 8   Period Weeks   Status On-going               Plan - 07/17/16 1103    Clinical Impression Statement Patient has met short term goals for TUG and BERG balance score. Patient improve 5 times sit to stand but has not yet met goal. Patient continues to benefit from skilled thearpy for LE strength and stability for balance and endurance.    Rehab Potential Good   Clinical Impairments Affecting Rehab Potential fall risk;  osteopenia;  hx of CVA;  visual impairments; does not drive   PT Frequency 2x / week   PT Duration 8 weeks   PT Treatment/Interventions ADLs/Self Care Home Management;Electrical Stimulation;Moist Heat;Cryotherapy;Therapeutic activities;Therapeutic exercise;Neuromuscular re-education;Patient/family education;Taping;Manual techniques   PT Next Visit Plan  standing dynamic balance ex narrow base of support and single leg   Consulted and Agree with Plan of Care Patient      Patient will benefit from skilled therapeutic intervention in order to improve the following deficits and impairments:  Decreased balance, Decreased activity tolerance, Decreased strength, Difficulty walking, Pain  Visit Diagnosis: Unspecified lack of coordination  Repeated falls  Muscle weakness (generalized)  Pain in right lower leg     Problem List Patient Active Problem List   Diagnosis Date Noted  . Hyperlipidemia 09/01/2014  . CKD (chronic kidney  disease), stage III 03/02/2014  . Rosacea 11/23/2013  . Depression 11/23/2013  . TIA (transient ischemic attack) 03/21/2013  . Hypertension 03/04/2012  . 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 Hx of ischemic left MCA stroke 04/08/2011  . PERSONAL HX COLONIC POLYPS 10/22/2009  . CHANGE IN BOWELS 09/20/2009  . GASTROESOPHAGEAL REFLUX DISEASE, MILD 06/04/2009  . Raynaud's syndrome 03/06/2009  . SCOLIOSIS, LUMBAR SPINE 01/03/2009  . Atrial fibrillation ([place]) 07/07/2007  . Hypothyroidism 11/23/2006  . Osteopenia 11/23/2006  . History of mitral valve repair 09/11/2006  . Osteoarthritis 09/11/2006    Mikle Bosworth PTA 07/17/2016, 11:08 AM  [facility] [address] [place], [place], [address] Phone: [phone]   Fax:  [phone]  Name: Theresa [name] MRN: [id number] Date of Birth: 1931/09/27

## 2016-07-22 ENCOUNTER — Ambulatory Visit: Payer: Medicare Other | Admitting: Physical Therapy

## 2016-07-22 DIAGNOSIS — M6281 Muscle weakness (generalized): Secondary | ICD-10-CM

## 2016-07-22 DIAGNOSIS — M79661 Pain in right lower leg: Secondary | ICD-10-CM

## 2016-07-22 DIAGNOSIS — R279 Unspecified lack of coordination: Secondary | ICD-10-CM

## 2016-07-22 DIAGNOSIS — R296 Repeated falls: Secondary | ICD-10-CM

## 2016-07-22 NOTE — Therapy (Signed)
[facility] [address] [place], [place], [address] Phone: [phone]   Fax:  [phone]  Physical Therapy Treatment  Patient Details  Name: Theresa [name] MRN: [id number] Date of Birth: 1932/03/03 Referring Provider: Dr. Yong Channel  Encounter Date: 07/22/2016      PT End of Session - 07/22/16 1052    Visit Number 8   Date for PT Re-Evaluation 08/19/16   PT Start Time 1975   PT Stop Time 1100   PT Time Calculation (min) 45 min   Activity Tolerance Patient tolerated treatment well      Past Medical History:  Diagnosis Date  . Atrial fibrillation (HCC)    AFIB  . Collagenous colitis   . CVA (cerebral vascular accident) ([place])    a.  L parietal CVA by MRI 88/32, likely embolic;  b.  TEE by report with neg bubble study;  c.  carotid dopplers  12/12:  + plaque, no sig ICA stenosis   . History of postmenopausal HRT   . Hx of adenomatous colonic polyps   . Hypothyroidism   . MVP (mitral valve prolapse)    a. s/p MV repair at [facility] in 2008;  b. Echocardiogram 7/12: EF 50%, status post mitral valve repair with mild MR and minimal MS, mean gradient 4, moderate LAE, LA diam 55 mm; mild RAE, PASP 28-32;   Marland Kitchen Osteoarthritis   . Osteoporosis     Past Surgical History:  Procedure Laterality Date  . CARDIOVERSION  01/09/2012   Procedure: CARDIOVERSION;  Surgeon: Jolaine Artist, MD;  Location: [facility] ENDOSCOPY;  Service: Cardiovascular;  Laterality: N/A;  . CATARACT EXTRACTION    . CHOLECYSTECTOMY    . DILATION AND CURETTAGE OF UTERUS    . LUMBAR FUSION    . MITRAL VALVE REPAIR  2008  . TONSILLECTOMY      There were no vitals filed for this visit.      Subjective Assessment - 07/22/16 1015    Subjective No pain at the moment, it comes and goes.  Overall the pain is the same.  Standing would bring it on.  Walked twice 1/2 mile-3/4 mile without husband's assist.  Usually a little tired after therapy but not too bad.     Pertinent History macular degeneration; osteopenic;  Raynauds in feet;  hx of TIA/CVA with residual right finger paresthesia and affected speech;  left elbow fx as a child   Currently in Pain? No/denies   Pain Score 0-No pain                         [facility] Adult PT Treatment/Exercise - 07/22/16 0001      Therapeutic Activites    Other Therapeutic Activities sit to stand, weight shifting, climbing stairs     Neuro Re-ed    Neuro Re-ed Details  weight shifting, narrow base of support, beach ball toss with varying base of support; maintaing balance with head and arm movements     Knee/Hip Exercises: Aerobic   Nustep L3 8 min  Therapist present to discuss treatment     Knee/Hip Exercises: Standing   Knee Flexion Limitations stepping across the line and back   SLS alternating step taps 10x   Other Standing Knee Exercises 4 ways 5x right and left   Other Standing Knee Exercises blue foam 4 way weight shift     Knee/Hip Exercises: Seated   Long Arc Quad Strengthening;Both;1 set;15 reps  Long Arc Quad Weight 5 lbs.   Clamshell with TheraBand Blue  3x10   Sit to Sand 2 sets;5 reps;without UE support                  PT Short Term Goals - 07/22/16 1058      PT SHORT TERM GOAL #1   Title The patient will express understanding of fall prevention/safety awareness for home  07/22/16   Status Achieved     PT SHORT TERM GOAL #2   Title The patient will be able to walk 1/4 mile with cane or hand-held assist from her husband   Status Achieved     PT SHORT TERM GOAL #3   Title BERG balance score improved from 40/56 to 43/56 indicating decreased fall risk   Status Achieved     PT SHORT TERM GOAL #4   Title Timed up and Go test improved to 17 sec    Status Achieved     PT SHORT TERM GOAL #5   Title 5x sit to stand test improved to 15 sec indicating improved LE strength for rising from the chair without UE use   Time 4   Period Weeks   Status On-going            PT Long Term Goals - 07/22/16 1058      PT LONG TERM GOAL #1   Title The patient will be independent in safe self progression of HEP for further improvements in balance and LE strength  08/19/16   Time 8   Period Weeks   Status On-going     PT LONG TERM GOAL #2   Title The patient will be able to walk 1/2 mile with cane or hand held assist of husband   Time 8   Period Weeks   Status On-going     PT LONG TERM GOAL #3   Title BERG balance test improved to 46/56 indicating decreased fall risk   Time 8   Period Weeks   Status On-going     PT LONG TERM GOAL #4   Title Timed up and Go improved to 15 sec indicating decreased fall risk   Time 8   Period Weeks   Status On-going               Plan - 07/22/16 1053    Clinical Impression Statement Almost all STGS met.  The patient has some difficulty following multistep directions.   Needs min assist for steadying with weight shifting on soft surface as well as with tandem stand.  Unable to single leg stand.  Visual impairment also affects her balance.  She reports her right ankle is uncomfortable post session 5/10.  Patient expresses some financial concern/copays and would like to progress to HEP as able.     Clinical Impairments Affecting Rehab Potential fall risk;  osteopenia;  hx of CVA;  visual impairments; does not drive   PT Treatment/Interventions ADLs/Self Care Home Management;Electrical Stimulation;Moist Heat;Cryotherapy;Therapeutic activities;Therapeutic exercise;Neuromuscular re-education;Patient/family education;Taping;Manual techniques   PT Next Visit Plan  standing dynamic balance ex narrow base of support and single leg   Recommended Other Services certification is signed      Patient will benefit from skilled therapeutic intervention in order to improve the following deficits and impairments:  Decreased balance, Decreased activity tolerance, Decreased strength, Difficulty walking, Pain  Visit  Diagnosis: Unspecified lack of coordination  Repeated falls  Muscle weakness (generalized)  Pain in right lower leg  Problem List Patient Active Problem List   Diagnosis Date Noted  . Hyperlipidemia 09/01/2014  . CKD (chronic kidney disease), stage III 03/02/2014  . Rosacea 11/23/2013  . Depression 11/23/2013  . TIA (transient ischemic attack) 03/21/2013  . Hypertension 03/04/2012  . 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 Hx of ischemic left MCA stroke 04/08/2011  . PERSONAL HX COLONIC POLYPS 10/22/2009  . CHANGE IN BOWELS 09/20/2009  . GASTROESOPHAGEAL REFLUX DISEASE, MILD 06/04/2009  . Raynaud's syndrome 03/06/2009  . SCOLIOSIS, LUMBAR SPINE 01/03/2009  . Atrial fibrillation ([place]) 07/07/2007  . Hypothyroidism 11/23/2006  . Osteopenia 11/23/2006  . History of mitral valve repair 09/11/2006  . Osteoarthritis 09/11/2006   Ruben Im, PT 07/22/16 11:10 AM Phone: [phone] Fax: [phone]  Alvera Singh 07/22/2016, 11:09 AM  [facility] [address], [place], [place], [address] Phone: [phone]   Fax:  [phone]  Name: Theresa [name] MRN: [id number] Date of Birth: 06/23/1931

## 2016-07-24 ENCOUNTER — Ambulatory Visit: Payer: Medicare Other | Admitting: Physical Therapy

## 2016-07-24 ENCOUNTER — Encounter: Payer: Self-pay | Admitting: Physical Therapy

## 2016-07-24 DIAGNOSIS — M6281 Muscle weakness (generalized): Secondary | ICD-10-CM | POA: Diagnosis not present

## 2016-07-24 DIAGNOSIS — R296 Repeated falls: Secondary | ICD-10-CM | POA: Diagnosis not present

## 2016-07-24 DIAGNOSIS — R279 Unspecified lack of coordination: Secondary | ICD-10-CM | POA: Diagnosis not present

## 2016-07-24 DIAGNOSIS — M79661 Pain in right lower leg: Secondary | ICD-10-CM

## 2016-07-24 NOTE — Therapy (Signed)
[facility] [address] [place], [place], [address] Phone: [phone]   Fax:  [phone]  Physical Therapy Treatment  Patient Details  Name: Theresa [name] MRN: [id number] Date of Birth: Dec 17, 1931 Referring Provider: Dr. Yong Channel  Encounter Date: 07/24/2016      PT End of Session - 07/24/16 1017    Visit Number 9   Date for PT Re-Evaluation 08/19/16   PT Start Time 4163   PT Stop Time 1053   PT Time Calculation (min) 38 min   Activity Tolerance Patient tolerated treatment well   Behavior During Therapy [facility] for tasks assessed/performed      Past Medical History:  Diagnosis Date  . Atrial fibrillation (HCC)    AFIB  . Collagenous colitis   . CVA (cerebral vascular accident) ([place])    a.  L parietal CVA by MRI 84/53, likely embolic;  b.  TEE by report with neg bubble study;  c.  carotid dopplers  12/12:  + plaque, no sig ICA stenosis   . History of postmenopausal HRT   . Hx of adenomatous colonic polyps   . Hypothyroidism   . MVP (mitral valve prolapse)    a. s/p MV repair at [facility] in 2008;  b. Echocardiogram 7/12: EF 50%, status post mitral valve repair with mild MR and minimal MS, mean gradient 4, moderate LAE, LA diam 55 mm; mild RAE, PASP 28-32;   Marland Kitchen Osteoarthritis   . Osteoporosis     Past Surgical History:  Procedure Laterality Date  . CARDIOVERSION  01/09/2012   Procedure: CARDIOVERSION;  Surgeon: Jolaine Artist, MD;  Location: [facility] ENDOSCOPY;  Service: Cardiovascular;  Laterality: N/A;  . CATARACT EXTRACTION    . CHOLECYSTECTOMY    . DILATION AND CURETTAGE OF UTERUS    . LUMBAR FUSION    . MITRAL VALVE REPAIR  2008  . TONSILLECTOMY      There were no vitals filed for this visit.      Subjective Assessment - 07/24/16 1016    Subjective Patient reports no pain in back at the moment.    Pertinent History macular degeneration; osteopenic;  Raynauds in feet;  hx of TIA/CVA with residual  right finger paresthesia and affected speech;  left elbow fx as a child   Limitations Walking;House hold activities   How long can you walk comfortably? 30-40 min   Diagnostic tests MRI before injections   Patient Stated Goals I don't want to fall;  I'm not sure what to do to help my balance   Currently in Pain? No/denies   Pain Score 0-No pain                         [facility] Adult PT Treatment/Exercise - 07/24/16 0001      Therapeutic Activites    Therapeutic Activities Other Therapeutic Activities   Other Therapeutic Activities sit to stand, weight shifting, climbing stairs     Neuro Re-ed    Neuro Re-ed Details  weight shifting, narrow base of support, beach ball toss with varying base of support; maintaing balance with head and arm movements     Knee/Hip Exercises: Aerobic   Nustep L3 8 min  Therapist present to discuss treatment     Knee/Hip Exercises: Standing   Knee Flexion Limitations stepping across the line and back  CGA forward and back, side to side   SLS alternating step taps 10x   Other Standing Knee Exercises  4 ways 5x right and left   Other Standing Knee Exercises blue foam 4 way weight shift  Single leg stance 30 seconds     Knee/Hip Exercises: Seated   Long Arc Quad Strengthening;Both;1 set;15 reps  Sitting on mat for increased core    Long Arc Quad Weight 5 lbs.   Abduction/Adduction  --  I, Y, T arm raises seated on blue mat   Sit to Sand 2 sets;5 reps;without UE support                  PT Short Term Goals - 07/24/16 1017      PT SHORT TERM GOAL #5   Title 5x sit to stand test improved to 15 sec indicating improved LE strength for rising from the chair without UE use   Baseline 18 seconds (07/17/16)   Time 4   Period Weeks   Status On-going           PT Long Term Goals - 07/24/16 1017      PT LONG TERM GOAL #1   Title The patient will be independent in safe self progression of HEP for further improvements in balance  and LE strength  08/19/16   Time 8   Period Weeks   Status On-going     PT LONG TERM GOAL #2   Title The patient will be able to walk 1/2 mile with cane or hand held assist of husband   Time 8   Period Weeks   Status On-going               Plan - 07/24/16 1055    Clinical Impression Statement Patient did well with all neuro reeducation exercises, did well with singel leg stance. Patient is weaker with stance on right foot than left. This is also evident with other stepping exercises.  Patient will continue to benefit from skilled thearpy for LE strengthening and stability.    Rehab Potential Good   Clinical Impairments Affecting Rehab Potential fall risk;  osteopenia;  hx of CVA;  visual impairments; does not drive   PT Frequency 2x / week   PT Duration 8 weeks   PT Treatment/Interventions ADLs/Self Care Home Management;Electrical Stimulation;Moist Heat;Cryotherapy;Therapeutic activities;Therapeutic exercise;Neuromuscular re-education;Patient/family education;Taping;Manual techniques   PT Next Visit Plan  standing dynamic balance ex narrow base of support and single leg   Consulted and Agree with Plan of Care Patient      Patient will benefit from skilled therapeutic intervention in order to improve the following deficits and impairments:  Decreased balance, Decreased activity tolerance, Decreased strength, Difficulty walking, Pain  Visit Diagnosis: Unspecified lack of coordination  Repeated falls  Muscle weakness (generalized)  Pain in right lower leg     Problem List Patient Active Problem List   Diagnosis Date Noted  . Hyperlipidemia 09/01/2014  . CKD (chronic kidney disease), stage III 03/02/2014  . Rosacea 11/23/2013  . Depression 11/23/2013  . TIA (transient ischemic attack) 03/21/2013  . Hypertension 03/04/2012  . 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 Hx of ischemic left MCA stroke 04/08/2011  . PERSONAL HX COLONIC POLYPS  10/22/2009  . CHANGE IN BOWELS 09/20/2009  . GASTROESOPHAGEAL REFLUX DISEASE, MILD 06/04/2009  . Raynaud's syndrome 03/06/2009  . SCOLIOSIS, LUMBAR SPINE 01/03/2009  . Atrial fibrillation ([place]) 07/07/2007  . Hypothyroidism 11/23/2006  . Osteopenia 11/23/2006  . History of mitral valve repair 09/11/2006  . Osteoarthritis 09/11/2006    Mikle Bosworth PTA 07/24/2016, 10:57 AM  [facility] [address], [place], [place], [address] Phone: [phone]   Fax:  [phone]  Name: Theresa [name] MRN: [id number] Date of Birth: [birth year]

## 2016-07-29 ENCOUNTER — Ambulatory Visit: Payer: Medicare Other | Admitting: Physical Therapy

## 2016-07-29 DIAGNOSIS — R279 Unspecified lack of coordination: Secondary | ICD-10-CM

## 2016-07-29 DIAGNOSIS — M6281 Muscle weakness (generalized): Secondary | ICD-10-CM | POA: Diagnosis not present

## 2016-07-29 DIAGNOSIS — M79661 Pain in right lower leg: Secondary | ICD-10-CM

## 2016-07-29 DIAGNOSIS — R296 Repeated falls: Secondary | ICD-10-CM

## 2016-07-29 NOTE — Therapy (Signed)
[facility] [address], [place], [place], [address] Phone: [phone]   Fax:  [phone]  Physical Therapy Treatment  Patient Details  Name: Theresa [name] MRN: [id number] Date of Birth: [birth year] Referring Provider: Dr. Yong Channel  Encounter Date: 07/29/2016      PT End of Session - 07/29/16 1556    Visit Number 10   Number of Visits 20   Date for PT Re-Evaluation 08/19/16   Authorization Type Medicare G codes   PT Start Time 6384   PT Stop Time 1100   PT Time Calculation (min) 45 min   Activity Tolerance Patient tolerated treatment well      Past Medical History:  Diagnosis Date  . Atrial fibrillation (HCC)    AFIB  . Collagenous colitis   . CVA (cerebral vascular accident) ([place])    a.  L parietal CVA by MRI 66/59, likely embolic;  b.  TEE by report with neg bubble study;  c.  carotid dopplers  12/12:  + plaque, no sig ICA stenosis   . History of postmenopausal HRT   . Hx of adenomatous colonic polyps   . Hypothyroidism   . MVP (mitral valve prolapse)    a. s/p MV repair at [facility] in 2008;  b. Echocardiogram 7/12: EF 50%, status post mitral valve repair with mild MR and minimal MS, mean gradient 4, moderate LAE, LA diam 55 mm; mild RAE, PASP 28-32;   Marland Kitchen Osteoarthritis   . Osteoporosis     Past Surgical History:  Procedure Laterality Date  . CARDIOVERSION  01/09/2012   Procedure: CARDIOVERSION;  Surgeon: Jolaine Artist, MD;  Location: [facility] ENDOSCOPY;  Service: Cardiovascular;  Laterality: N/A;  . CATARACT EXTRACTION    . CHOLECYSTECTOMY    . DILATION AND CURETTAGE OF UTERUS    . LUMBAR FUSION    . MITRAL VALVE REPAIR  2008  . TONSILLECTOMY      There were no vitals filed for this visit.      Subjective Assessment - 07/29/16 1021    Subjective I had a catch in my back the last few days but it is better now.  Overall  I am feeling more confident in my balance.  I do notice some difficulty when  turning to talk to people at church.     Pertinent History macular degeneration; osteopenic;  Raynauds in feet;  hx of TIA/CVA with residual right finger paresthesia and affected speech;  left elbow fx as a child   Currently in Pain? No/denies   Pain Score 0-No pain            [facility] PT Assessment - 07/29/16 0001      Observation/Other Assessments   Activities of Balance Confidence Scale (ABC Scale)  80% confidence     Standardized Balance Assessment   Five times sit to stand comments  15 sec     Dynamic Gait Index   Level Surface Normal   Change in Gait Speed Mild Impairment   Gait with Horizontal Head Turns Mild Impairment   Gait with Vertical Head Turns Mild Impairment   Gait and Pivot Turn Mild Impairment   Step Over Obstacle Mild Impairment   Step Around Obstacles Mild Impairment   Steps Mild Impairment   Total Score 17     Timed Up and Go Test   Manual TUG (seconds) 11.7  [place] Adult PT Treatment/Exercise - 07/29/16 0001      Therapeutic Activites    Other Therapeutic Activities sit to stand, weight shifting, climbing stairs     Neuro Re-ed    Neuro Re-ed Details  weight shifting, narrow base of support     Knee/Hip Exercises: Standing   Heel Raises Both;1 set;10 reps   Hip ADduction AROM;Right;Left;10 reps   Hip Extension AROM;Right;Left;10 reps   Other Standing Knee Exercises sidestepping, SLS, tandem standing                PT Education - 07/29/16 1554    Education provided Yes   Education Details standing balance ex   Person(s) Educated Patient   Methods Explanation;Demonstration;Handout   Comprehension Verbalized understanding;Returned demonstration          PT Short Term Goals - 07/29/16 1601      PT SHORT TERM GOAL #1   Title The patient will express understanding of fall prevention/safety awareness for home  07/22/16   Status Achieved     PT SHORT TERM GOAL #2   Title The patient will be able to walk  1/4 mile with cane or hand-held assist from her husband   Status Achieved     PT SHORT TERM GOAL #3   Title BERG balance score improved from 40/56 to 43/56 indicating decreased fall risk   Status Achieved     PT SHORT TERM GOAL #4   Title Timed up and Go test improved to 17 sec    Status Achieved     PT SHORT TERM GOAL #5   Title 5x sit to stand test improved to 15 sec indicating improved LE strength for rising from the chair without UE use   Status Achieved           PT Long Term Goals - 07/29/16 1602      PT LONG TERM GOAL #1   Title The patient will be independent in safe self progression of HEP for further improvements in balance and LE strength  08/19/16   Time 8   Period Weeks   Status On-going     PT LONG TERM GOAL #2   Title The patient will be able to walk 1/2 mile with cane or hand held assist of husband   Time 8   Period Weeks   Status On-going     PT LONG TERM GOAL #3   Title BERG balance test improved to 46/56 indicating decreased fall risk   Time 8   Period Weeks   Status On-going     PT LONG TERM GOAL #4   Title Timed up and Go improved to 15 sec indicating decreased fall risk   Status Achieved     PT LONG TERM GOAL #5   Title Improved LE strength to 4+/5 needed for greater ease rising from a chair without arms and negotiating curbs, steps in the community   Time 8   Period Weeks   Status On-going               Plan - 07/29/16 1557    Clinical Impression Statement The patient has made excellent improvements in Activities-Specific Balance confidence Scale from initial assessment.  Her Dynamic Gait Index improved by 2 points as well with minimal impairments in all categories with maneuvering around and over obstacles and head movements while ambulating.  Improved gait speed with timed up and go.  The patient expresses financial concerns regarding her co-pay and requests discharge  next visit to an independent HEP.     Clinical Impairments  Affecting Rehab Potential fall risk;  osteopenia;  hx of CVA;  visual impairments; does not drive   PT Treatment/Interventions ADLs/Self Care Home Management;Electrical Stimulation;Moist Heat;Cryotherapy;Therapeutic activities;Therapeutic exercise;Neuromuscular re-education;Patient/family education;Taping;Manual techniques   PT Next Visit Plan review balance HEP and add standing on soft surface;  BERG;  MMT;  review progress toward goals and discharge      Patient will benefit from skilled therapeutic intervention in order to improve the following deficits and impairments:  Decreased balance, Decreased activity tolerance, Decreased strength, Difficulty walking, Pain  Visit Diagnosis: Unspecified lack of coordination  Repeated falls  Muscle weakness (generalized)  Pain in right lower leg       G-Codes - 03-Aug-2016 1603    Functional Assessment Tool Used (Outpatient Only) Clinical judgement; BERG   Functional Limitation Mobility: Walking and moving around   Mobility: Walking and Moving Around Current Status (K3491) At least 20 percent but less than 40 percent impaired, limited or restricted   Mobility: Walking and Moving Around Goal Status [phone]) At least 20 percent but less than 40 percent impaired, limited or restricted      Problem List Patient Active Problem List   Diagnosis Date Noted  . Hyperlipidemia 09/01/2014  . CKD (chronic kidney disease), stage III 03/02/2014  . Rosacea 11/23/2013  . Depression 11/23/2013  . TIA (transient ischemic attack) 03/21/2013  . Hypertension 03/04/2012  . 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 Hx of ischemic left MCA stroke 04/08/2011  . PERSONAL HX COLONIC POLYPS 10/22/2009  . CHANGE IN BOWELS 09/20/2009  . GASTROESOPHAGEAL REFLUX DISEASE, MILD 06/04/2009  . Raynaud's syndrome 03/06/2009  . SCOLIOSIS, LUMBAR SPINE 01/03/2009  . Atrial fibrillation ([place]) 07/07/2007  . Hypothyroidism 11/23/2006  . Osteopenia  11/23/2006  . History of mitral valve repair 09/11/2006  . Osteoarthritis 09/11/2006   Ruben Im, PT 08/03/2016 4:05 PM Phone: [phone] Fax: [phone]  Alvera Singh 03-Aug-2016, 4:05 PM  [facility] [address], [place], [place], [address] Phone: [phone]   Fax:  [phone]  Name: Theresa [name] MRN: [id number] Date of Birth: 23-Feb-1932

## 2016-07-29 NOTE — Patient Instructions (Signed)
  Single Leg - Eyes Open  Holding support, lift right leg while maintaining balance over other leg. Progress to removing hands from support surface for longer periods of time. Hold___5-10_ seconds. Repeat ____5 times per session. Do _1___ sessions per day. Single Leg (Compliant Surface) - Eyes Open  Stand on compliant surface: ___pillow_____ holding support. Lift right leg while maintaining balance over other leg. Progress to removing hands from support surface for longer periods of time. Hold___5-10_ seconds. Repeat __5__ times per session. Do _1__ sessions per day.  Feet Heel-Toe "Tandem", Arm Motion  Eyes Open  With eyes open, right foot directly in front of the other, move arms up and down: to front. Repeat __3__ times per session. Do __1__ sessions per day. Feet Heel-Toe "Tandem" (Compliant Surface) Arm Motion - Eyes Open  With eyes open, standing on compliant surface: ________, right foot directly in front of the other, move arms up and down: to front. Repeat ____ times per session. Do ____ per day.  Feet Heel-Toe "Tandem"   Arms outstretched, walk a straight line bringing one foot directly in front of the other. Repeat for __1__ minutes per session. 1_ per day. Side-Stepping   Walk to left side with eyes open. Take even steps, leading with same foot. Repeat in other direction. Repeat for _ minutes per session.Do _1_ per day.   Ruben Im PT [facility] [address] [place], [place] [address] Phone # [phone] Fax [phone]

## 2016-07-31 ENCOUNTER — Ambulatory Visit: Payer: Medicare Other | Admitting: Physical Therapy

## 2016-07-31 DIAGNOSIS — R296 Repeated falls: Secondary | ICD-10-CM | POA: Diagnosis not present

## 2016-07-31 DIAGNOSIS — R279 Unspecified lack of coordination: Secondary | ICD-10-CM | POA: Diagnosis not present

## 2016-07-31 DIAGNOSIS — M6281 Muscle weakness (generalized): Secondary | ICD-10-CM | POA: Diagnosis not present

## 2016-07-31 DIAGNOSIS — M79661 Pain in right lower leg: Secondary | ICD-10-CM

## 2016-07-31 NOTE — Therapy (Signed)
[facility] [address] [place], [place], [address] Phone: [phone]   Fax:  [phone]  Physical Therapy Treatment  Patient Details  Name: Theresa [name] MRN: [id number] Date of Birth: 20-Jan-1932 Referring Provider: Dr. Yong Channel  Encounter Date: 07/31/2016    Past Medical History:  Diagnosis Date  . Atrial fibrillation (HCC)    AFIB  . Collagenous colitis   . CVA (cerebral vascular accident) ([place])    a.  L parietal CVA by MRI 17/79, likely embolic;  b.  TEE by report with neg bubble study;  c.  carotid dopplers  12/12:  + plaque, no sig ICA stenosis   . History of postmenopausal HRT   . Hx of adenomatous colonic polyps   . Hypothyroidism   . MVP (mitral valve prolapse)    a. s/p MV repair at [facility] in 2008;  b. Echocardiogram 7/12: EF 50%, status post mitral valve repair with mild MR and minimal MS, mean gradient 4, moderate LAE, LA diam 55 mm; mild RAE, PASP 28-32;   Marland Kitchen Osteoarthritis   . Osteoporosis     Past Surgical History:  Procedure Laterality Date  . CARDIOVERSION  01/09/2012   Procedure: CARDIOVERSION;  Surgeon: Jolaine Artist, MD;  Location: [facility] ENDOSCOPY;  Service: Cardiovascular;  Laterality: N/A;  . CATARACT EXTRACTION    . CHOLECYSTECTOMY    . DILATION AND CURETTAGE OF UTERUS    . LUMBAR FUSION    . MITRAL VALVE REPAIR  2008  . TONSILLECTOMY      There were no vitals filed for this visit.      Subjective Assessment - 07/31/16 1019    Subjective Patient reports she got up from her chair too fast and the foot part of the chair tipped forward causing her to fall.  She states it had nothing to do with her balance.  States she has small abrasions on her neck and arm.     Pertinent History macular degeneration; osteopenic;  Raynauds in feet;  hx of TIA/CVA with residual right finger paresthesia and affected speech;  left elbow fx as a child   Currently in Pain? No/denies   Pain Score 0-No  pain   Pain Location Back   Pain Orientation Right   Pain Frequency Intermittent   Aggravating Factors  stooping over            [facility] PT Assessment - 07/31/16 0001      Strength   Strength Assessment Site --  4+/5 bil ankles, knees, hips     Berg Balance Test   Sit to Stand Able to stand without using hands and stabilize independently   Standing Unsupported Able to stand safely 2 minutes   Sitting with Back Unsupported but Feet Supported on Floor or Stool Able to sit safely and securely 2 minutes   Stand to Sit Sits safely with minimal use of hands   Transfers Able to transfer safely, minor use of hands   Standing Unsupported with Eyes Closed Able to stand 10 seconds safely   Standing Ubsupported with Feet Together Able to place feet together independently and stand 1 minute safely   From Standing, Reach Forward with Outstretched Arm Can reach forward >12 cm safely (5")   From Standing Position, Pick up Object from Floor Able to pick up shoe safely and easily   From Standing Position, Turn to Look Behind Over each Shoulder Looks behind from both sides and weight shifts well   Turn  360 Degrees Able to turn 360 degrees safely in 4 seconds or less   Standing Unsupported, Alternately Place Feet on Step/Stool Able to stand independently and complete 8 steps >20 seconds   Standing Unsupported, One Foot in Front Able to take small step independently and hold 30 seconds   Standing on One Leg Tries to lift leg/unable to hold 3 seconds but remains standing independently   Total Score 49                     [facility] Adult PT Treatment/Exercise - 07/31/16 0001      Therapeutic Activites    Other Therapeutic Activities sit to stand, weight shifting, climbing stairs     Neuro Re-ed    Neuro Re-ed Details  weight shifting, narrow base of support     Knee/Hip Exercises: Aerobic   Nustep L1 10 min     Knee/Hip Exercises: Standing   Heel Raises Both;1 set;10 reps   Hip  ADduction AROM;Right;Left;10 reps   Hip Extension AROM;Right;Left;10 reps   Other Standing Knee Exercises sidestepping, SLS, tandem standing     Knee/Hip Exercises: Seated   Sit to Sand --  discussed for home                  PT Short Term Goals - 07/31/16 1028      PT SHORT TERM GOAL #1   Title The patient will express understanding of fall prevention/safety awareness for home  07/22/16   Status Achieved     PT SHORT TERM GOAL #2   Title The patient will be able to walk 1/4 mile with cane or hand-held assist from her husband   Status Achieved     PT SHORT TERM GOAL #3   Title BERG balance score improved from 40/56 to 43/56 indicating decreased fall risk   Status Achieved     PT SHORT TERM GOAL #4   Title Timed up and Go test improved to 17 sec    Status Achieved     PT SHORT TERM GOAL #5   Title 5x sit to stand test improved to 15 sec indicating improved LE strength for rising from the chair without UE use   Status Achieved           PT Long Term Goals - 07/31/16 1028      PT LONG TERM GOAL #1   Title The patient will be independent in safe self progression of HEP for further improvements in balance and LE strength  08/19/16   Status Achieved     PT LONG TERM GOAL #2   Title The patient will be able to walk 1/2 mile with cane or hand held assist of husband   Baseline patient unsure--hasn't tried lately b/c it's been raining   Time 8   Period Weeks   Status Partially Met     PT LONG TERM GOAL #3   Title BERG balance test improved to 46/56 indicating decreased fall risk   Status Achieved     PT LONG TERM GOAL #4   Title Timed up and Go improved to 15 sec indicating decreased fall risk   Status Achieved     PT LONG TERM GOAL #5   Title Improved LE strength to 4+/5 needed for greater ease rising from a chair without arms and negotiating curbs, steps in the community   Status Achieved               Plan -  2016-08-04 1034    Clinical Impression  Statement The patient has progressed well with significant improvements in Activities Balance confidence Scale, Dynamic Gait Index by 2 points, Timed up and Go test, BERG balance test and lower extremity strength testing.  She does have  mild to moderate balance deficits with single limb and narrow base of support tasks.  She will always have increased risk of falls secondary to her visual impairment and we have discussed strategies to employ to reduce fall risk as well as a comprehensive HEP for balance and LE strengthening.   The patient has financial concerns about her co-pay and requests to be discharge to an independent program at this time.   She has met the majority of goals.  She has not attempted a longer walk at this point.  Will discharge from PT at this time at her request.     Clinical Impairments Affecting Rehab Potential fall risk;  osteopenia;  hx of CVA;  visual impairments; does not drive      Patient will benefit from skilled therapeutic intervention in order to improve the following deficits and impairments:     Visit Diagnosis: Unspecified lack of coordination  Repeated falls  Muscle weakness (generalized)  Pain in right lower leg       G-Codes - 08/04/2016 1030    Functional Assessment Tool Used (Outpatient Only) Clinical judgement; BERG   Functional Limitation Mobility: Walking and moving around   Mobility: Walking and Moving Around Goal Status [phone]) At least 20 percent but less than 40 percent impaired, limited or restricted   Mobility: Walking and Moving Around Discharge Status [phone]) At least 20 percent but less than 40 percent impaired, limited or restricted      Problem List Patient Active Problem List   Diagnosis Date Noted  . Hyperlipidemia 09/01/2014  . CKD (chronic kidney disease), stage III 03/02/2014  . Rosacea 11/23/2013  . Depression 11/23/2013  . TIA (transient ischemic attack) 03/21/2013  . Hypertension 03/04/2012  . Dysarthria as late effect  of cerebrovascular disease 05/30/2011  . Hx of ischemic left MCA stroke 04/08/2011  . PERSONAL HX COLONIC POLYPS 10/22/2009  . CHANGE IN BOWELS 09/20/2009  . GASTROESOPHAGEAL REFLUX DISEASE, MILD 06/04/2009  . Raynaud's syndrome 03/06/2009  . SCOLIOSIS, LUMBAR SPINE 01/03/2009  . Atrial fibrillation ([place]) 07/07/2007  . Hypothyroidism 11/23/2006  . Osteopenia 11/23/2006  . History of mitral valve repair 09/11/2006  . Osteoarthritis 09/11/2006    Alvera Singh Aug 04, 2016, 11:03 AM  [facility] [address], [place], [place], [address] Phone: [phone]   Fax:  [phone]  Name: Theresa [name] MRN: [id number] Date of Birth: 12-10-1931

## 2016-07-31 NOTE — Therapy (Signed)
[facility] [address] [place], [place], [address] Phone: [phone]   Fax:  [phone]  Physical Therapy Treatment/Discharge Summary  Patient Details  Name: Theresa [name] MRN: [id number] Date of Birth: 08/03/1931 Referring Provider: Dr. Yong Channel  Encounter Date: 07/31/2016      PT End of Session - 07/31/16 1103    Visit Number 11   Number of Visits 20   Date for PT Re-Evaluation 08/19/16   Authorization Type Medicare G codes   PT Start Time 2703   PT Stop Time 1053   PT Time Calculation (min) 38 min   Activity Tolerance Patient tolerated treatment well      Past Medical History:  Diagnosis Date  . Atrial fibrillation (HCC)    AFIB  . Collagenous colitis   . CVA (cerebral vascular accident) ([place])    a.  L parietal CVA by MRI 50/09, likely embolic;  b.  TEE by report with neg bubble study;  c.  carotid dopplers  12/12:  + plaque, no sig ICA stenosis   . History of postmenopausal HRT   . Hx of adenomatous colonic polyps   . Hypothyroidism   . MVP (mitral valve prolapse)    a. s/p MV repair at [facility] in 2008;  b. Echocardiogram 7/12: EF 50%, status post mitral valve repair with mild MR and minimal MS, mean gradient 4, moderate LAE, LA diam 55 mm; mild RAE, PASP 28-32;   Marland Kitchen Osteoarthritis   . Osteoporosis     Past Surgical History:  Procedure Laterality Date  . CARDIOVERSION  01/09/2012   Procedure: CARDIOVERSION;  Surgeon: Jolaine Artist, MD;  Location: [facility] ENDOSCOPY;  Service: Cardiovascular;  Laterality: N/A;  . CATARACT EXTRACTION    . CHOLECYSTECTOMY    . DILATION AND CURETTAGE OF UTERUS    . LUMBAR FUSION    . MITRAL VALVE REPAIR  2008  . TONSILLECTOMY      There were no vitals filed for this visit.      Subjective Assessment - 07/31/16 1019    Subjective Patient reports she got up from her chair too fast and the foot part of the chair tipped forward causing her to fall.  She states it  had nothing to do with her balance.  States she has small abrasions on her neck and arm.     Pertinent History macular degeneration; osteopenic;  Raynauds in feet;  hx of TIA/CVA with residual right finger paresthesia and affected speech;  left elbow fx as a child   Currently in Pain? No/denies   Pain Score 0-No pain   Pain Location Back   Pain Orientation Right   Pain Frequency Intermittent   Aggravating Factors  stooping over            [facility] PT Assessment - 07/31/16 0001      Strength   Strength Assessment Site --  4+/5 bil ankles, knees, hips     Berg Balance Test   Sit to Stand Able to stand without using hands and stabilize independently   Standing Unsupported Able to stand safely 2 minutes   Sitting with Back Unsupported but Feet Supported on Floor or Stool Able to sit safely and securely 2 minutes   Stand to Sit Sits safely with minimal use of hands   Transfers Able to transfer safely, minor use of hands   Standing Unsupported with Eyes Closed Able to stand 10 seconds safely   Standing Ubsupported with Feet Together  Able to place feet together independently and stand 1 minute safely   From Standing, Reach Forward with Outstretched Arm Can reach forward >12 cm safely (5")   From Standing Position, Pick up Object from [place] to pick up shoe safely and easily   From Standing Position, Turn to Look Behind Over each Shoulder Looks behind from both sides and weight shifts well   Turn 360 Degrees Able to turn 360 degrees safely in 4 seconds or less   Standing Unsupported, Alternately Place Feet on Step/Stool Able to stand independently and complete 8 steps >20 seconds   Standing Unsupported, One Foot in [place] to take small step independently and hold 30 seconds   Standing on One Leg Tries to lift leg/unable to hold 3 seconds but remains standing independently   Total Score 49                     [facility] Adult PT Treatment/Exercise - 07/31/16 0001       Therapeutic Activites    Other Therapeutic Activities sit to stand, weight shifting, climbing stairs     Neuro Re-ed    Neuro Re-ed Details  weight shifting, narrow base of support     Knee/Hip Exercises: Aerobic   Nustep L1 10 min     Knee/Hip Exercises: Standing   Heel Raises Both;1 set;10 reps   Hip ADduction AROM;Right;Left;10 reps   Hip Extension AROM;Right;Left;10 reps   Other Standing Knee Exercises sidestepping, SLS, tandem standing     Knee/Hip Exercises: Seated   Sit to Sand --  discussed for home                  PT Short Term Goals - 07/31/16 1028      PT SHORT TERM GOAL #1   Title The patient will express understanding of fall prevention/safety awareness for home  07/22/16   Status Achieved     PT SHORT TERM GOAL #2   Title The patient will be able to walk 1/4 mile with cane or hand-held assist from her husband   Status Achieved     PT SHORT TERM GOAL #3   Title BERG balance score improved from 40/56 to 43/56 indicating decreased fall risk   Status Achieved     PT SHORT TERM GOAL #4   Title Timed up and Go test improved to 17 sec    Status Achieved     PT SHORT TERM GOAL #5   Title 5x sit to stand test improved to 15 sec indicating improved LE strength for rising from the chair without UE use   Status Achieved           PT Long Term Goals - 07/31/16 1028      PT LONG TERM GOAL #1   Title The patient will be independent in safe self progression of HEP for further improvements in balance and LE strength  08/19/16   Status Achieved     PT LONG TERM GOAL #2   Title The patient will be able to walk 1/2 mile with cane or hand held assist of husband   Baseline patient unsure--hasn't tried lately b/c it's been raining   Time 8   Period Weeks   Status Partially Met     PT LONG TERM GOAL #3   Title BERG balance test improved to 46/56 indicating decreased fall risk   Status Achieved     PT LONG TERM GOAL #4   Title Timed  up and Go improved to  15 sec indicating decreased fall risk   Status Achieved     PT LONG TERM GOAL #5   Title Improved LE strength to 4+/5 needed for greater ease rising from a chair without arms and negotiating curbs, steps in the community   Status Achieved               Plan - 08/12/16 1034    Clinical Impression Statement The patient has progressed well with significant improvements in Activities Balance confidence Scale, Dynamic Gait Index by 2 points, Timed up and Go test, BERG balance test and lower extremity strength testing.  She does have  mild to moderate balance deficits with single limb and narrow base of support tasks.  She will always have increased risk of falls secondary to her visual impairment and we have discussed strategies to employ to reduce fall risk as well as a comprehensive HEP for balance and LE strengthening.   The patient has financial concerns about her co-pay and requests to be discharge to an independent program at this time.   She has met the majority of goals.  She has not attempted a longer walk at this point.  Will discharge from PT at this time at her request.     Clinical Impairments Affecting Rehab Potential fall risk;  osteopenia;  hx of CVA;  visual impairments; does not drive      Patient will benefit from skilled therapeutic intervention in order to improve the following deficits and impairments:     Visit Diagnosis: Unspecified lack of coordination  Repeated falls  Muscle weakness (generalized)  Pain in right lower leg  PHYSICAL THERAPY DISCHARGE SUMMARY  Visits from Start of Care: 11  Current functional level related to goals / functional outcomes: See clinical impressions above   Remaining deficits: As above   Education / Equipment: Comprehensive HEP Plan: Patient agrees to discharge.  Patient goals were partially met. Patient is being discharged due to the patient's request.  ?????         G-Codes - Aug 12, 2016 1030    Functional Assessment  Tool Used (Outpatient Only) Clinical judgement; BERG   Functional Limitation Mobility: Walking and moving around   Mobility: Walking and Moving Around Goal Status [phone]) At least 20 percent but less than 40 percent impaired, limited or restricted   Mobility: Walking and Moving Around Discharge Status [phone]) At least 20 percent but less than 40 percent impaired, limited or restricted      Problem List Patient Active Problem List   Diagnosis Date Noted  . Hyperlipidemia 09/01/2014  . CKD (chronic kidney disease), stage III 03/02/2014  . Rosacea 11/23/2013  . Depression 11/23/2013  . TIA (transient ischemic attack) 03/21/2013  . Hypertension 03/04/2012  . 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 Hx of ischemic left MCA stroke 04/08/2011  . PERSONAL HX COLONIC POLYPS 10/22/2009  . CHANGE IN BOWELS 09/20/2009  . GASTROESOPHAGEAL REFLUX DISEASE, MILD 06/04/2009  . Raynaud's syndrome 03/06/2009  . SCOLIOSIS, LUMBAR SPINE 01/03/2009  . Atrial fibrillation ([place]) 07/07/2007  . Hypothyroidism 11/23/2006  . Osteopenia 11/23/2006  . History of mitral valve repair 09/11/2006  . Osteoarthritis 09/11/2006   Ruben Im, PT 2016-08-12 11:06 AM Phone: [phone] Fax: [phone]  Alvera Singh 08-12-2016, 11:04 AM  [facility] [address] [place], [place], [address] Phone: [phone]   Fax:  [phone]  Name: Audine Mangione MRN: [id number] Date  of Birth: 11-Dec-1931

## 2016-08-10 ENCOUNTER — Other Ambulatory Visit: Payer: Self-pay | Admitting: Family Medicine

## 2016-09-11 DIAGNOSIS — H353132 Nonexudative age-related macular degeneration, bilateral, intermediate dry stage: Secondary | ICD-10-CM | POA: Diagnosis not present

## 2016-09-11 DIAGNOSIS — H04123 Dry eye syndrome of bilateral lacrimal glands: Secondary | ICD-10-CM | POA: Diagnosis not present

## 2016-09-11 DIAGNOSIS — H52203 Unspecified astigmatism, bilateral: Secondary | ICD-10-CM | POA: Diagnosis not present

## 2016-09-11 DIAGNOSIS — Z961 Presence of intraocular lens: Secondary | ICD-10-CM | POA: Diagnosis not present

## 2016-09-15 ENCOUNTER — Other Ambulatory Visit: Payer: Self-pay | Admitting: Family Medicine

## 2016-09-26 ENCOUNTER — Encounter: Payer: Self-pay | Admitting: Family Medicine

## 2016-09-26 ENCOUNTER — Ambulatory Visit (INDEPENDENT_AMBULATORY_CARE_PROVIDER_SITE_OTHER): Payer: Medicare Other | Admitting: Family Medicine

## 2016-09-26 DIAGNOSIS — I481 Persistent atrial fibrillation: Secondary | ICD-10-CM

## 2016-09-26 DIAGNOSIS — E785 Hyperlipidemia, unspecified: Secondary | ICD-10-CM

## 2016-09-26 DIAGNOSIS — E034 Atrophy of thyroid (acquired): Secondary | ICD-10-CM | POA: Diagnosis not present

## 2016-09-26 DIAGNOSIS — I10 Essential (primary) hypertension: Secondary | ICD-10-CM

## 2016-09-26 DIAGNOSIS — I4819 Other persistent atrial fibrillation: Secondary | ICD-10-CM

## 2016-09-26 NOTE — Assessment & Plan Note (Signed)
S: well controlled on atorvastatin 20mg . No myalgias.  Lab Results  Component Value Date   CHOL 131 03/05/2016   HDL 72.70 03/05/2016   LDLCALC 45 03/05/2016   LDLDIRECT 47.0 09/04/2015   TRIG 65.0 03/05/2016   CHOLHDL 2 03/05/2016   A/P: continue current rx

## 2016-09-26 NOTE — Patient Instructions (Signed)
No changes today. Thanks for checking in  In the next few weeks or months get your annual wellness visit with our nurse here but the home check looked good as well  ______________________________________________________________________  Starting October 1st 2018, I will be transferring to our new location: [facility] [place] (corner of [address] from [employer]) [place], [place] Phone: [phone]  I would love to have you remain my patient at this new location as long as it remains convenient for you. I am excited about the opportunity to have x-ray and sports medicine in the new building but will really miss the awesome staff and physicians at [facility]. Continue to schedule appointments at [facility] and we will automatically transfer them to the [place] location starting October 1st.

## 2016-09-26 NOTE — Assessment & Plan Note (Signed)
S: Lab Results  Component Value Date   TSH 1.13 06/10/2016   On thyroid medication-levothyroxine 75 mcg A/P: continue current rx. Update next visit

## 2016-09-26 NOTE — Assessment & Plan Note (Signed)
S: compliant with Digoxin 0.0625mg , metoprolol 100mg  BID for rate control. On Xarelto 20mg  for anticoagulation.  A/P: continue current medications

## 2016-09-26 NOTE — Assessment & Plan Note (Signed)
S: controlled on metoprolol 100mg  BID.  BP Readings from Last 3 Encounters:  09/26/16 122/88  06/10/16 140/86  03/05/16 132/82  A/P: We discussed blood pressure goal of <140/90. Continue current meds

## 2016-09-26 NOTE — Progress Notes (Signed)
Subjective:  Theresa [name] is a 83 y.o. year old very pleasant female patient who presents for/with See problem oriented charting ROS- no chest pain. Has some fatigue. Feels winded with stairs at times- not worsening. No cough. No edema.    Past Medical History-  Patient Active Problem List   Diagnosis Date Noted  . TIA (transient ischemic attack) 03/21/2013    Priority: High  . Hx of ischemic left MCA stroke 04/08/2011    Priority: High  . Atrial fibrillation ([place]) 07/07/2007    Priority: High  . History of mitral valve repair 09/11/2006    Priority: High  . Hyperlipidemia 09/01/2014    Priority: Medium  . CKD (chronic kidney disease), stage III 03/02/2014    Priority: Medium  . Depression 11/23/2013    Priority: Medium  . Hypertension 03/04/2012    Priority: Medium  . Hypothyroidism 11/23/2006    Priority: Medium  . Osteopenia 11/23/2006    Priority: Medium  . Osteoarthritis 09/11/2006    Priority: Medium  . Rosacea 11/23/2013    Priority: Low  . 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 PERSONAL HX COLONIC POLYPS 10/22/2009    Priority: Low  . CHANGE IN BOWELS 09/20/2009    Priority: Low  . GASTROESOPHAGEAL REFLUX DISEASE, MILD 06/04/2009    Priority: Low  . Raynaud's syndrome 03/06/2009    Priority: Low  . SCOLIOSIS, LUMBAR SPINE 01/03/2009    Priority: Low    Medications- reviewed and updated Current Outpatient Prescriptions  Medication Sig Dispense Refill  . atorvastatin (LIPITOR) 20 MG tablet TAKE 1 TABLET BY MOUTH EVERY EVENING 90 tablet 1  . Calcium Carbonate-Vitamin D (CALTRATE 600+D) 600-400 MG-UNIT per tablet Take 1 tablet by mouth 2 (two) times daily.      . Cholecalciferol (VITAMIN D) 2000 UNITS CAPS Take 2,000 Units by mouth daily.     . diclofenac sodium (VOLTAREN) 1 % GEL Apply 2 g topically 3 (three) times daily as needed. 100 g 3  . digoxin (LANOXIN) 0.125 MG tablet TAKE ONE-HALF TABLET BY MOUTH ONCE  DAILY 30 tablet 5  . escitalopram (LEXAPRO) 10 MG tablet TAKE ONE TABLET BY MOUTH ONCE DAILY 30 tablet 5  . etodolac (LODINE) 400 MG tablet TAKE ONE TABLET BY MOUTH ONCE DAILY 90 tablet 2  . glucosamine-chondroitin 500-400 MG tablet Take 1 tablet by mouth 2 (two) times daily.      Marland Kitchen levothyroxine (SYNTHROID, LEVOTHROID) 75 MCG tablet TAKE ONE TABLET BY MOUTH ONCE DAILY BEFORE  BREAKFAST 90 tablet 3  . metoprolol (LOPRESSOR) 100 MG tablet Take 1 tablet (100 mg total) by mouth 2 (two) times daily. 180 tablet 2  . Multiple Vitamins-Minerals (PRESERVISION AREDS 2 PO) Take 1 tablet by mouth 2 (two) times daily. Reported on 09/04/2015    . rivaroxaban (XARELTO) 20 MG TABS tablet Take 1 tablet (20 mg total) by mouth daily with supper. 90 tablet 3  . vitamin A 10000 UNIT capsule Take 10,000 Units by mouth daily.     No current facility-administered medications for this visit.     Objective: BP 122/88 (BP Location: Right Arm, Patient Position: Sitting, Cuff Size: Normal)   Pulse 64   Temp (!) 97.5 F (36.4 C) (Oral)   Ht 5\' 2"  (1.575 m)   Wt 106 lb 6.4 oz (48.3 kg)   SpO2 94%   BMI 19.46 kg/m  Gen: NAD, resting comfortably CV: irregularly irregular no murmurs rubs or gallops Lungs:  CTAB no crackles, wheeze, rhonchi Abdomen: soft/nontender/nondistended/normal bowel sounds. No rebound or guarding.  Ext: no edema Skin: warm, dry  Assessment/Plan:  Atrial fibrillation S: compliant with Digoxin 0.0625mg , metoprolol 100mg  BID for rate control. On Xarelto 20mg  for anticoagulation.  A/P: continue current medications   Hyperlipidemia S: well controlled on atorvastatin 20mg . No myalgias.  Lab Results  Component Value Date   CHOL 131 03/05/2016   HDL 72.70 03/05/2016   LDLCALC 45 03/05/2016   LDLDIRECT 47.0 09/04/2015   TRIG 65.0 03/05/2016   CHOLHDL 2 03/05/2016   A/P: continue current rx   Hypothyroidism S: Lab Results  Component Value Date   TSH 1.13 06/10/2016   On thyroid  medication-levothyroxine 75 mcg A/P: continue current rx. Update next visit     Hypertension S: controlled on metoprolol 100mg  BID.  BP Readings from Last 3 Encounters:  09/26/16 122/88  06/10/16 140/86  03/05/16 132/82  A/P: We discussed blood pressure goal of <140/90. Continue current meds   She states balance is not perfect but much improved from with rehab. Also her right low back pain into the leg improves with elevating leg when it bothers her.   Patient also had some questions from her home AWV and we reviewed each of these. She is planning for AWV in office soon.   Labs at CPE to include TSh under hypothyroidism, cbc, cmp, lipid under hyperlipidemia.   Return in about 6 months (around 03/29/2017) for physical.  Meds ordered this encounter  Medications  . vitamin A 10000 UNIT capsule    Sig: Take 10,000 Units by mouth daily.    Return precautions advised.  Garret Reddish, MD

## 2016-09-30 DIAGNOSIS — H353132 Nonexudative age-related macular degeneration, bilateral, intermediate dry stage: Secondary | ICD-10-CM | POA: Diagnosis not present

## 2016-09-30 DIAGNOSIS — H35373 Puckering of macula, bilateral: Secondary | ICD-10-CM | POA: Diagnosis not present

## 2016-09-30 DIAGNOSIS — H43813 Vitreous degeneration, bilateral: Secondary | ICD-10-CM | POA: Diagnosis not present

## 2016-10-29 ENCOUNTER — Ambulatory Visit (INDEPENDENT_AMBULATORY_CARE_PROVIDER_SITE_OTHER): Payer: Medicare Other

## 2016-10-29 VITALS — BP 126/60 | HR 66 | Ht 61.0 in | Wt 105.0 lb

## 2016-10-29 DIAGNOSIS — Z0000 Encounter for general adult medical examination without abnormal findings: Secondary | ICD-10-CM

## 2016-10-29 NOTE — Patient Instructions (Addendum)
Theresa [name] , Thank you for taking time to come for your Medicare Wellness Visit. I appreciate your ongoing commitment to your health goals. Please review the following plan we discussed and let me know if I can assist you in the future.   [facility] Hard of [facility] Services - can assist with hearing aid x 1  No reviews  [facility]  [phone]  [phone]  May have adaptive equipment for phone and TV   WILL TAKE THE FLU VACCINE  WILL TAKE YOUR 2ND [place] DUE    These are the goals we discussed: Goals    . patient          Did like to travel; think about options; Private trip with other; plan something away this year     . patient          Improve balance I will send EMMI information [email or web address]          This is a list of the screening recommended for you and due dates:  Health Maintenance  Topic Date Due  . Flu Shot  10/15/2016  . Tetanus Vaccine  12/16/2020  . DEXA scan (bone density measurement)  Completed  . Pneumonia vaccines  Completed    Health Maintenance for Postmenopausal Women Menopause is a normal process in which your reproductive ability comes to an end. This process happens gradually over a span of months to years, usually between the ages of 39 and 43. Menopause is complete when you have missed 12 consecutive menstrual periods. It is important to talk with your health care provider about some of the most common conditions that affect postmenopausal women, such as heart disease, cancer, and bone loss (osteoporosis). Adopting a healthy lifestyle and getting preventive care can help to promote your health and wellness. Those actions can also lower your chances of developing some of these common conditions. What should I know about menopause? During menopause, you may experience a number of symptoms, such as:  Moderate-to-severe hot flashes.  Night sweats.  Decrease in sex drive.  Mood  swings.  Headaches.  Tiredness.  Irritability.  Memory problems.  Insomnia.  Choosing to treat or not to treat menopausal changes is an individual decision that you make with your health care provider. What should I know about hormone replacement therapy and supplements? Hormone therapy products are effective for treating symptoms that are associated with menopause, such as hot flashes and night sweats. Hormone replacement carries certain risks, especially as you become older. If you are thinking about using estrogen or estrogen with progestin treatments, discuss the benefits and risks with your health care provider. What should I know about heart disease and stroke? Heart disease, heart attack, and stroke become more likely as you age. This may be due, in part, to the hormonal changes that your body experiences during menopause. These can affect how your body processes dietary fats, triglycerides, and cholesterol. Heart attack and stroke are both medical emergencies. There are many things that you can do to help prevent heart disease and stroke:  Have your blood pressure checked at least every 1-2 years. High blood pressure causes heart disease and increases the risk of stroke.  If you are 57-52 years old, ask your health care provider if you should take aspirin to prevent a heart attack or a stroke.  Do not use any tobacco products, including cigarettes, chewing tobacco, or electronic cigarettes. If you need help quitting,  ask your health care provider.  It is important to eat a healthy diet and maintain a healthy weight. ? Be sure to include plenty of vegetables, fruits, low-fat dairy products, and lean protein. ? Avoid eating foods that are high in solid fats, added sugars, or salt (sodium).  Get regular exercise. This is one of the most important things that you can do for your health. ? Try to exercise for at least 150 minutes each week. The type of exercise that you do should  increase your heart rate and make you sweat. This is known as moderate-intensity exercise. ? Try to do strengthening exercises at least twice each week. Do these in addition to the moderate-intensity exercise.  Know your numbers.Ask your health care provider to check your cholesterol and your blood glucose. Continue to have your blood tested as directed by your health care provider.  What should I know about cancer screening? There are several types of cancer. Take the following steps to reduce your risk and to catch any cancer development as early as possible. Breast Cancer  Practice breast self-awareness. ? This means understanding how your breasts normally appear and feel. ? It also means doing regular breast self-exams. Let your health care provider know about any changes, no matter how small.  If you are 44 or older, have a clinician do a breast exam (clinical breast exam or CBE) every year. Depending on your age, family history, and medical history, it may be recommended that you also have a yearly breast X-ray (mammogram).  If you have a family history of breast cancer, talk with your health care provider about genetic screening.  If you are at high risk for breast cancer, talk with your health care provider about having an MRI and a mammogram every year.  Breast cancer (BRCA) gene test is recommended for women who have family members with BRCA-related cancers. Results of the assessment will determine the need for genetic counseling and BRCA1 and for BRCA2 testing. BRCA-related cancers include these types: ? Breast. This occurs in males or females. ? Ovarian. ? Tubal. This may also be called fallopian tube cancer. ? Cancer of the abdominal or pelvic lining (peritoneal cancer). ? Prostate. ? Pancreatic.  Cervical, Uterine, and Ovarian Cancer Your health care provider may recommend that you be screened regularly for cancer of the pelvic organs. These include your ovaries, uterus,  and vagina. This screening involves a pelvic exam, which includes checking for microscopic changes to the surface of your cervix (Pap test).  For women ages 21-65, health care providers may recommend a pelvic exam and a Pap test every three years. For women ages 64-65, they may recommend the Pap test and pelvic exam, combined with testing for human papilloma virus (HPV), every five years. Some types of HPV increase your risk of cervical cancer. Testing for HPV may also be done on women of any age who have unclear Pap test results.  Other health care providers may not recommend any screening for nonpregnant women who are considered low risk for pelvic cancer and have no symptoms. Ask your health care provider if a screening pelvic exam is right for you.  If you have had past treatment for cervical cancer or a condition that could lead to cancer, you need Pap tests and screening for cancer for at least 20 years after your treatment. If Pap tests have been discontinued for you, your risk factors (such as having a new sexual partner) need to be reassessed to determine  if you should start having screenings again. Some women have medical problems that increase the chance of getting cervical cancer. In these cases, your health care provider may recommend that you have screening and Pap tests more often.  If you have a family history of uterine cancer or ovarian cancer, talk with your health care provider about genetic screening.  If you have vaginal bleeding after reaching menopause, tell your health care provider.  There are currently no reliable tests available to screen for ovarian cancer.  Lung Cancer Lung cancer screening is recommended for adults 8-56 years old who are at high risk for lung cancer because of a history of smoking. A yearly low-dose CT scan of the lungs is recommended if you:  Currently smoke.  Have a history of at least 30 pack-years of smoking and you currently smoke or have quit  within the past 15 years. A pack-year is smoking an average of one pack of cigarettes per day for one year.  Yearly screening should:  Continue until it has been 15 years since you quit.  Stop if you develop a health problem that would prevent you from having lung cancer treatment.  Colorectal Cancer  This type of cancer can be detected and can often be prevented.  Routine colorectal cancer screening usually begins at age 41 and continues through age 69.  If you have risk factors for colon cancer, your health care provider may recommend that you be screened at an earlier age.  If you have a family history of colorectal cancer, talk with your health care provider about genetic screening.  Your health care provider may also recommend using home test kits to check for hidden blood in your stool.  A small camera at the end of a tube can be used to examine your colon directly (sigmoidoscopy or colonoscopy). This is done to check for the earliest forms of colorectal cancer.  Direct examination of the colon should be repeated every 5-10 years until age 61. However, if early forms of precancerous polyps or small growths are found or if you have a family history or genetic risk for colorectal cancer, you may need to be screened more often.  Skin Cancer  Check your skin from head to toe regularly.  Monitor any moles. Be sure to tell your health care provider: ? About any new moles or changes in moles, especially if there is a change in a mole's shape or color. ? If you have a mole that is larger than the size of a pencil eraser.  If any of your family members has a history of skin cancer, especially at a young age, talk with your health care provider about genetic screening.  Always use sunscreen. Apply sunscreen liberally and repeatedly throughout the day.  Whenever you are outside, protect yourself by wearing long sleeves, pants, a wide-brimmed hat, and sunglasses.  What should I know  about osteoporosis? Osteoporosis is a condition in which bone destruction happens more quickly than new bone creation. After menopause, you may be at an increased risk for osteoporosis. To help prevent osteoporosis or the bone fractures that can happen because of osteoporosis, the following is recommended:  If you are 45-34 years old, get at least 1,000 mg of calcium and at least 600 mg of vitamin D per day.  If you are older than age 4 but younger than age 24, get at least 1,200 mg of calcium and at least 600 mg of vitamin D per day.  If  you are older than age 53, get at least 1,200 mg of calcium and at least 800 mg of vitamin D per day.  Smoking and excessive alcohol intake increase the risk of osteoporosis. Eat foods that are rich in calcium and vitamin D, and do weight-bearing exercises several times each week as directed by your health care provider. What should I know about how menopause affects my mental health? Depression may occur at any age, but it is more common as you become older. Common symptoms of depression include:  Low or sad mood.  Changes in sleep patterns.  Changes in appetite or eating patterns.  Feeling an overall lack of motivation or enjoyment of activities that you previously enjoyed.  Frequent crying spells.  Talk with your health care provider if you think that you are experiencing depression. What should I know about immunizations? It is important that you get and maintain your immunizations. These include:  Tetanus, diphtheria, and pertussis (Tdap) booster vaccine.  Influenza every year before the flu season begins.  Pneumonia vaccine.  Shingles vaccine.  Your health care provider may also recommend other immunizations. This information is not intended to replace advice given to you by your health care provider. Make sure you discuss any questions you have with your health care provider. Document Released: 04/25/2005 Document Revised: 09/21/2015  Document Reviewed: 12/05/2014 Elsevier Interactive Patient Education  2018 [place] in the Home Falls can cause injuries and can affect people from all age groups. There are many simple things that you can do to make your home safe and to help prevent falls. What can I do on the outside of my home?  Regularly repair the edges of walkways and driveways and fix any cracks.  Remove high doorway thresholds.  Trim any shrubbery on the main path into your home.  Use bright outdoor lighting.  Clear walkways of debris and clutter, including tools and rocks.  Regularly check that handrails are securely fastened and in good repair. Both sides of any steps should have handrails.  Install guardrails along the edges of any raised decks or porches.  Have leaves, snow, and ice cleared regularly.  Use sand or salt on walkways during winter months.  In the garage, clean up any spills right away, including grease or oil spills. What can I do in the bathroom?  Use night lights.  Install grab bars by the toilet and in the tub and shower. Do not use towel bars as grab bars.  Use non-skid mats or decals on the floor of the tub or shower.  If you need to sit down while you are in the shower, use a plastic, non-slip stool.  Keep the floor dry. Immediately clean up any water that spills on the floor.  Remove soap buildup in the tub or shower on a regular basis.  Attach bath mats securely with double-sided non-slip rug tape.  Remove throw rugs and other tripping hazards from the floor. What can I do in the bedroom?  Use night lights.  Make sure that a bedside light is easy to reach.  Do not use oversized bedding that drapes onto the floor.  Have a firm chair that has side arms to use for getting dressed.  Remove throw rugs and other tripping hazards from the floor. What can I do in the kitchen?  Clean up any spills right away.  Avoid walking on wet  floors.  Place frequently used items in easy-to-reach places.  If you  need to reach for something above you, use a sturdy step stool that has a grab bar.  Keep electrical cables out of the way.  Do not use floor polish or wax that makes floors slippery. If you have to use wax, make sure that it is non-skid floor wax.  Remove throw rugs and other tripping hazards from the floor. What can I do in the stairways?  Do not leave any items on the stairs.  Make sure that there are handrails on both sides of the stairs. Fix handrails that are broken or loose. Make sure that handrails are as long as the stairways.  Check any carpeting to make sure that it is firmly attached to the stairs. Fix any carpet that is loose or worn.  Avoid having throw rugs at the top or bottom of stairways, or secure the rugs with carpet tape to prevent them from moving.  Make sure that you have a light switch at the top of the stairs and the bottom of the stairs. If you do not have them, have them installed. What are some other fall prevention tips?  Wear closed-toe shoes that fit well and support your feet. Wear shoes that have rubber soles or low heels.  When you use a stepladder, make sure that it is completely opened and that the sides are firmly locked. Have someone hold the ladder while you are using it. Do not climb a closed stepladder.  Add color or contrast paint or tape to grab bars and handrails in your home. Place contrasting color strips on the first and last steps.  Use mobility aids as needed, such as canes, walkers, scooters, and crutches.  Turn on lights if it is dark. Replace any light bulbs that burn out.  Set up furniture so that there are clear paths. Keep the furniture in the same spot.  Fix any uneven floor surfaces.  Choose a carpet design that does not hide the edge of steps of a stairway.  Be aware of any and all pets.  Review your medicines with your healthcare provider. Some  medicines can cause dizziness or changes in blood pressure, which increase your risk of falling. Talk with your health care provider about other ways that you can decrease your risk of falls. This may include working with a physical therapist or trainer to improve your strength, balance, and endurance. This information is not intended to replace advice given to you by your health care provider. Make sure you discuss any questions you have with your health care provider. Document Released: 02/21/2002 Document Revised: 07/31/2015 Document Reviewed: 04/07/2014 Elsevier Interactive Patient Education  2017 [employer].

## 2016-10-29 NOTE — Progress Notes (Signed)
Subjective:   Theresa [name] is a 83 y.o. female who presents for Medicare Annual (Subsequent) preventive examination. The Patient was informed that the wellness visit is to identify future health risk and educate and initiate measures that can reduce risk for increased disease through the lifespan.    Annual Wellness Assessment  Reports health as  Had more challenges; Vision is not as good Not driving  Had a stroke; speech was slow  Had some balance issues as well Has fallen; one time getting up in the middle of the night  Noted some sob if she exerts herself     Medicare Annual Preventive Care Visit - Subsequent Last OV 07/13  Describes Health as poor, fair, good or great? fair  Female Preventive Health Colonoscopy 09/2009 - has a hx of polyps  Mammogram /03/2016 DEXA 10/2008 - note regarding osteopenia Pap 2008   Management of Risk: Hx of Left MCA stroke with numbness in fingers; slowed speech  VS: reviewed  Diet  Has been this weight for some time Fruit and granola and toast; good seed bread Lunch; sandwich Supper mostly eat frozen meals  Spouse takes a protein drink  Discussed supplementing diet Eats almonds at times for extra calories Dark chocolate    BMI 19   Exercise did get back to walking x 30 minutes; was walking x 2 miles prior to leg pain. Which comes and goes   Dental: no issues   Stressors: some issues with her eyes   Sleep patterns: good   Pain? 1-10 pain level is 6;   Hearing Screening Comments: No hearing issues  Vision Screening Comments: Dry MD Sees Dr. Kathrin Penner Dr. Joanne Chars regarding macular areds and gel for eyes      Cardiac Risk Factors Addressed Hyperlipidemia - HDL 72/ chol/HDL ratio is 2 / Trig 65 Diabetes neg    Advanced Directives - completed   Depression covered by med at present   Patient Care Team: Marin Olp, MD as PCP - General (Family Medicine) Assessed for additional  providers  Immunization History  Administered Date(s) Administered  . Influenza Split 12/17/2010, 12/02/2011  . Influenza Whole 03/17/2001, 12/18/2006, 12/28/2007, 01/03/2009, 12/26/2009  . Influenza, High Dose Seasonal PF 12/22/2012, 03/06/2015, 11/26/2015  . Influenza,inj,Quad PF,36+ Mos 11/23/2013  . Pneumococcal Conjugate-13 03/02/2014  . Pneumococcal Polysaccharide-23 03/17/2000, 09/16/2006  . Td 03/18/1995, 09/15/2006  . Tdap 12/17/2010  . Zoster Recombinat (Shingrix) 09/18/2016   Required Immunizations needed today  Screening test up to date or reviewed for plan of completion Health Maintenance Due  Topic Date Due  . INFLUENZA VACCINE  10/15/2016     Keep in mind the flu shot is an inactivated vaccine and takes at least 2 weeks to build immunity. The flu virus can be dormant for 4 days prior to symptoms Taking the flu shot at the beginning of the season can reduce the risk for the entire community.  Cardiac Risk Factors include: advanced age (>59men, >62 women);dyslipidemia;family history of premature cardiovascular disease;hypertension     Objective:     Vitals: BP 126/60   Pulse 66   Ht 5\' 1"  (1.549 m)   Wt 105 lb (47.6 kg)   SpO2 98%   BMI 19.84 kg/m   Body mass index is 19.84 kg/m.   Tobacco History  Smoking Status  . Former Smoker  . Quit date: 04/28/1966  Smokeless Tobacco  . Never Used     Counseling given: Not Answered   Past Medical History:  Diagnosis  Date  . Atrial fibrillation ([place])    AFIB  . Collagenous colitis   . CVA (cerebral vascular accident) ([place])    a.  L parietal CVA by MRI 38/10, likely embolic;  b.  TEE by report with neg bubble study;  c.  carotid dopplers  12/12:  + plaque, no sig ICA stenosis   . History of postmenopausal HRT   . Hx of adenomatous colonic polyps   . Hypothyroidism   . MVP (mitral valve prolapse)    a. s/p MV repair at [facility] in 2008;  b. Echocardiogram 7/12: EF 50%, status post mitral valve repair with  mild MR and minimal MS, mean gradient 4, moderate LAE, LA diam 55 mm; mild RAE, PASP 28-32;   Marland Kitchen Osteoarthritis   . Osteoporosis    Past Surgical History:  Procedure Laterality Date  . CARDIOVERSION  01/09/2012   Procedure: CARDIOVERSION;  Surgeon: Jolaine Artist, MD;  Location: [facility] ENDOSCOPY;  Service: Cardiovascular;  Laterality: N/A;  . CATARACT EXTRACTION    . CHOLECYSTECTOMY    . DILATION AND CURETTAGE OF UTERUS    . LUMBAR FUSION    . MITRAL VALVE REPAIR  2008  . TONSILLECTOMY     Family History  Problem Relation Age of Onset  . Cancer Father        COLON  . Stroke Neg Hx        1ST DEGREE RELATIVE <60   History  Sexual Activity  . Sexual activity: Not Currently    Outpatient Encounter Prescriptions as of 10/29/2016  Medication Sig  . atorvastatin (LIPITOR) 20 MG tablet TAKE 1 TABLET BY MOUTH EVERY EVENING  . Calcium Carbonate-Vitamin D (CALTRATE 600+D) 600-400 MG-UNIT per tablet Take 1 tablet by mouth 2 (two) times daily.    . Cholecalciferol (VITAMIN D) 2000 UNITS CAPS Take 2,000 Units by mouth daily.   . diclofenac sodium (VOLTAREN) 1 % GEL Apply 2 g topically 3 (three) times daily as needed.  . digoxin (LANOXIN) 0.125 MG tablet TAKE ONE-HALF TABLET BY MOUTH ONCE DAILY  . escitalopram (LEXAPRO) 10 MG tablet TAKE ONE TABLET BY MOUTH ONCE DAILY  . etodolac (LODINE) 400 MG tablet TAKE ONE TABLET BY MOUTH ONCE DAILY  . glucosamine-chondroitin 500-400 MG tablet Take 1 tablet by mouth 2 (two) times daily.    Marland Kitchen levothyroxine (SYNTHROID, LEVOTHROID) 75 MCG tablet TAKE ONE TABLET BY MOUTH ONCE DAILY BEFORE  BREAKFAST  . metoprolol (LOPRESSOR) 100 MG tablet Take 1 tablet (100 mg total) by mouth 2 (two) times daily.  . Multiple Vitamins-Minerals (PRESERVISION AREDS 2 PO) Take 1 tablet by mouth 2 (two) times daily. Reported on 09/04/2015  . rivaroxaban (XARELTO) 20 MG TABS tablet Take 1 tablet (20 mg total) by mouth daily with supper.  . saccharomyces boulardii (FLORASTOR) 250  MG capsule Take 250 mg by mouth 2 (two) times daily.  . vitamin A 10000 UNIT capsule Take 10,000 Units by mouth daily.   No facility-administered encounter medications on file as of 10/29/2016.     Activities of Daily Living In your present state of health, do you have any difficulty performing the following activities: 10/29/2016  Hearing? N  Vision? Y  Difficulty concentrating or making decisions? N  Walking or climbing stairs? N  Dressing or bathing? N  Comment you have shower;   Doing errands, shopping? N  Preparing Food and eating ? N  Using the Toilet? N  In the past six months, have you accidently leaked urine? N  Do you have problems with loss of bowel control? Y  Managing your Medications? N  Managing your Finances? N  Housekeeping or managing your Housekeeping? N  Some recent data might be hidden    Patient Care Team: Marin Olp, MD as PCP - General (Family Medicine)    Assessment:     Exercise Activities and Dietary recommendations Current Exercise Habits: Home exercise routine, Type of exercise: walking, Time (Minutes): 30, Intensity: Mild  Goals    . patient          Did like to travel; think about options; Private trip with other; plan something away this year     . patient          Improve balance I will send EMMI information [email or web address]         Fall Risk Fall Risk  10/29/2016 09/28/2015 09/01/2014 03/25/2013 02/28/2013  Falls in the past year? Yes No No Yes Yes  Number falls in past yr: 1 - - 1 -  Injury with Fall? - - - No -  Risk for fall due to : - - - Impaired mobility Impaired balance/gait  Follow up Education provided - - - -   Depression Screen PHQ 2/9 Scores 10/29/2016 03/05/2016 09/28/2015 09/01/2014  PHQ - 2 Score 0 0 0 0  PHQ- 9 Score - - - -     Cognitive Function MMSE - Mini Mental State Exam 10/29/2016 09/28/2015 02/15/2014  Not completed: - (No Data) -  Orientation to time 5 4 5   Orientation to Place 5 5 5    Registration 3 3 3   Attention/ Calculation 5 5 3   Recall 3 2 3   Language- name 2 objects 2 2 2   Language- repeat 1 1 1   Language- follow 3 step command 3 3 3   Language- read & follow direction 1 1 1   Write a sentence 1 1 1   Copy design 1 1 1   Total score 30 28 28         Immunization History  Administered Date(s) Administered  . Influenza Split 12/17/2010, 12/02/2011  . Influenza Whole 03/17/2001, 12/18/2006, 12/28/2007, 01/03/2009, 12/26/2009  . Influenza, High Dose Seasonal PF 12/22/2012, 03/06/2015, 11/26/2015  . Influenza,inj,Quad PF,36+ Mos 11/23/2013  . Pneumococcal Conjugate-13 03/02/2014  . Pneumococcal Polysaccharide-23 03/17/2000, 09/16/2006  . Td 03/18/1995, 09/15/2006  . Tdap 12/17/2010  . Zoster Recombinat (Shingrix) 09/18/2016   Screening Tests Health Maintenance  Topic Date Due  . INFLUENZA VACCINE  10/15/2016  . TETANUS/TDAP  12/16/2020  . DEXA SCAN  Completed  . PNA vac Low Risk Adult  Completed      Plan:     PCP Notes   Health Maintenance The patient will take her flu shot when available. The patient will take her second shingrix in September.   Abnormal Screens  The patient has adequate protein levels. BMI is 19. States her weight is stable. Discussed ways of getting more calories and nutrition throughout the day. Mini-Mental status exam was perfectly normal.  Referrals  Spouse has issues with hard of hearing and did give her information on the division of hard of hearing for adaptive equipment  Patient concerns; Patient states her pain level is a 6. Attributes this to her back pain and pain down her right leg. States she does not feel this will probably get much better at this time. Offered another appointment with Dr. Yong Channel to review. She declines for now. Is able to walk approximately 30 minutes but was walking 2 miles.  Also has dry in D and is currently doing okay, but is concerned about long-term loss of eyesight.  Urge to stool  throughout the day and evening; 83 yo 6 times, but does not actually have much stool but more gas  Taking Florastor 250mg  bid  The patient did complain of more balance issues but has not fallen recently. States she had one fall in the shower but no injury  Will send out balance exercises for her to practice via EMMI.   Next PCP apt Next appointment is January 2019.      I have personally reviewed and noted the following in the patient's chart:   . Medical and social history . Use of alcohol, tobacco or illicit drugs  . Current medications and supplements . Functional ability and status . Nutritional status . Physical activity . Advanced directives . List of other physicians . Hospitalizations, surgeries, and ER visits in previous 12 months . Vitals . Screenings to include cognitive, depression, and falls . Referrals and appointments  In addition, I have reviewed and discussed with patient certain preventive protocols, quality metrics, and best practice recommendations. A written personalized care plan for preventive services as well as general preventive health recommendations were provided to patient.     Wynetta Fines, RN  10/29/2016

## 2016-10-29 NOTE — Progress Notes (Signed)
I have reviewed and agree with note, evaluation, plan.   Tyquisha Sharps, MD  

## 2016-11-18 ENCOUNTER — Other Ambulatory Visit: Payer: Self-pay | Admitting: Family Medicine

## 2016-11-20 DIAGNOSIS — Z23 Encounter for immunization: Secondary | ICD-10-CM | POA: Diagnosis not present

## 2016-11-24 ENCOUNTER — Encounter: Payer: Self-pay | Admitting: Family Medicine

## 2016-11-26 ENCOUNTER — Encounter: Payer: Self-pay | Admitting: Family Medicine

## 2016-11-26 ENCOUNTER — Ambulatory Visit (INDEPENDENT_AMBULATORY_CARE_PROVIDER_SITE_OTHER): Payer: Medicare Other | Admitting: Family Medicine

## 2016-11-26 VITALS — BP 128/82 | HR 82 | Temp 98.5°F | Ht 61.0 in | Wt 103.8 lb

## 2016-11-26 DIAGNOSIS — J069 Acute upper respiratory infection, unspecified: Secondary | ICD-10-CM

## 2016-11-26 DIAGNOSIS — R198 Other specified symptoms and signs involving the digestive system and abdomen: Secondary | ICD-10-CM | POA: Insufficient documentation

## 2016-11-26 DIAGNOSIS — R1310 Dysphagia, unspecified: Secondary | ICD-10-CM | POA: Insufficient documentation

## 2016-11-26 NOTE — Patient Instructions (Signed)
Upper Respiratory infection History and exam today are suggestive of viral infection most likely due to upper respiratory infection. This also could be a reaction to the flu shot though it is lasting longer than I would suspect. Symptomatic treatment with: mucinex to loosen sinus congestion  We discussed that we did not find any infection that had higher probability of being bacterial such as pneumonia, strep throat, or ear infection. We discussed signs that bacterial infection may have developed particularly fever or shortness of breath. I am also concerned about your sinus pressure- if this is not improving by Monday and sinus pressure worsens- would send in augmentin (antibiotic for you) Likely course of 10-14 days otherwise. Patient is contagious and advised good handwashing and consideration of mask If going to be in public places.   Finally, we reviewed reasons to return to care including if symptoms worsen or persist or new concerns arise- once again particularly shortness of breath or fever.     Try smaller glucosamine tablets - could check at a vitamin shop for this or with your pharmacy- if still having issues may stop completely. If have issues with any other foods or pills- would refer to stomach doctors to scope the throat

## 2016-11-26 NOTE — Assessment & Plan Note (Signed)
See HPI.   Advised the following- Try smaller glucosamine tablets - could check at a vitamin shop for this or with your pharmacy- if still having issues may stop completely. If have issues with any other foods or pills- would refer to stomach doctors to scope the throat

## 2016-11-26 NOTE — Progress Notes (Signed)
PCP: Marin Olp, MD  Subjective:  Theresa [name] is a 83 y.o. year old very pleasant female patient who presents with Upper Respiratory infection symptoms including nasal congestion,  cough, hoarseness. Symptoms since Friday. She has clear mucus mixed with green and yellow discharge from nares. Also coughs this up at times. Has had slight bloody nose when blowing nose. On Monday morning noted small volume of this when she coughed/cleared her throat. Has not tried any medications. Did get flu shot a day before symptoms. Does take xarelto  She also has had a longer issue for a few months. When she takes glucosamine-chondroitin she would feel like it would get stuck in her throat and have to drink large volume of water to get it down (large pills)Started cutting them in half- goes down more easily but feels a scraping on left side of throat but does not feel like getting stuck as bad Dad with diverticulum of esophagus. No unintentional weight loss. Smoker but quit 1968  ROS-denies fever, SOB, NVD, tooth pain  Pertinent Past Medical History-  Patient Active Problem List   Diagnosis Date Noted  . TIA (transient ischemic attack) 03/21/2013    Priority: High  . Hx of ischemic left MCA stroke 04/08/2011    Priority: High  . Atrial fibrillation ([place]) 07/07/2007    Priority: High  . History of mitral valve repair 09/11/2006    Priority: High  . Hyperlipidemia 09/01/2014    Priority: Medium  . CKD (chronic kidney disease), stage III 03/02/2014    Priority: Medium  . Depression 11/23/2013    Priority: Medium  . Hypertension 03/04/2012    Priority: Medium  . Hypothyroidism 11/23/2006    Priority: Medium  . Osteopenia 11/23/2006    Priority: Medium  . Osteoarthritis 09/11/2006    Priority: Medium  . Rosacea 11/23/2013    Priority: Low  . 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 PERSONAL HX COLONIC POLYPS 10/22/2009    Priority: Low  .  CHANGE IN BOWELS 09/20/2009    Priority: Low  . GASTROESOPHAGEAL REFLUX DISEASE, MILD 06/04/2009    Priority: Low  . Raynaud's syndrome 03/06/2009    Priority: Low  . SCOLIOSIS, LUMBAR SPINE 01/03/2009    Priority: Low   Medications- reviewed  Current Outpatient Prescriptions  Medication Sig Dispense Refill  . atorvastatin (LIPITOR) 20 MG tablet TAKE 1 TABLET BY MOUTH EVERY EVENING 90 tablet 1  . Calcium Carbonate-Vitamin D (CALTRATE 600+D) 600-400 MG-UNIT per tablet Take 1 tablet by mouth 2 (two) times daily.      . Cholecalciferol (VITAMIN D) 2000 UNITS CAPS Take 2,000 Units by mouth daily.     . diclofenac sodium (VOLTAREN) 1 % GEL Apply 2 g topically 3 (three) times daily as needed. 100 g 3  . digoxin (LANOXIN) 0.125 MG tablet TAKE ONE-HALF TABLET BY MOUTH ONCE DAILY 30 tablet 5  . escitalopram (LEXAPRO) 10 MG tablet TAKE ONE TABLET BY MOUTH ONCE DAILY 30 tablet 5  . etodolac (LODINE) 400 MG tablet TAKE ONE TABLET BY MOUTH ONCE DAILY 90 tablet 2  . glucosamine-chondroitin 500-400 MG tablet Take 1 tablet by mouth 2 (two) times daily.      Marland Kitchen levothyroxine (SYNTHROID, LEVOTHROID) 75 MCG tablet TAKE ONE TABLET BY MOUTH ONCE DAILY BEFORE  BREAKFAST 90 tablet 3  . metoprolol (LOPRESSOR) 100 MG tablet Take 1 tablet (100 mg total) by mouth 2 (two) times daily. 180 tablet 2  .  Multiple Vitamins-Minerals (PRESERVISION AREDS 2 PO) Take 1 tablet by mouth 2 (two) times daily. Reported on 09/04/2015    . rivaroxaban (XARELTO) 20 MG TABS tablet Take 1 tablet (20 mg total) by mouth daily with supper. 90 tablet 3  . saccharomyces boulardii (FLORASTOR) 250 MG capsule Take 250 mg by mouth 2 (two) times daily.    . vitamin A 10000 UNIT capsule Take 10,000 Units by mouth daily.     No current facility-administered medications for this visit.     Objective: BP 128/82 (BP Location: Left Arm, Patient Position: Sitting, Cuff Size: Normal)   Pulse 82   Temp 98.5 F (36.9 C) (Oral)   Ht 5\' 1"  (1.549 m)    Wt 103 lb 12.8 oz (47.1 kg)   SpO2 99%   BMI 19.61 kg/m  Gen: NAD, resting comfortably HEENT: Turbinates erythematous, TM normal, pharynx mildly erythematous with no tonsilar exudate or edema, no sinus tenderness CV: RRR no murmurs rubs or gallops Lungs: CTAB no crackles, wheeze, rhonchi Abdomen: soft/nontender/nondistended/normal bowel sounds. No rebound or guarding.  Ext: no edema Skin: warm, dry, no rash Neuro: grossly normal, moves all extremities  Assessment/Plan:  Upper Respiratory infection History and exam today are suggestive of viral infection most likely due to upper respiratory infection. This also could be a reaction to the flu shot though it is lasting longer than I would suspect. Symptomatic treatment with: mucinex to loosen sinus congestion  We discussed that we did not find any infection that had higher probability of being bacterial such as pneumonia, strep throat, or ear infection. We discussed signs that bacterial infection may have developed particularly fever or shortness of breath. I am also concerned about your sinus pressure- if this is not improving by Monday and sinus pressure worsens- would send in augmentin (antibiotic for you) Likely course of 10-14 days otherwise. Patient is contagious and advised good handwashing and consideration of mask If going to be in public places.   Finally, we reviewed reasons to return to care including if symptoms worsen or persist or new concerns arise- once again particularly shortness of breath or fever.  Difficulty swallowing pills See HPI.   Advised the following- Try smaller glucosamine tablets - could check at a vitamin shop for this or with your pharmacy- if still having issues may stop completely. If have issues with any other foods or pills- would refer to stomach doctors to scope the throat offered ENT referral as well but she prefers conservative measures first.   Garret Reddish, MD

## 2016-12-25 NOTE — Progress Notes (Signed)
Patient ID: Theresa [name], female   DOB: 08-11-1931, 83 y.o.   MRN: [id number]   83 y.o.  has a history of mitral regurgitation, status post mitral valve repair at [facility] in 2008, paroxysmal atrial fibrillation, prior stroke in 12/12 at [facility] (felt to be embolic), on chronic Coumadin therapy, colitis, Raynaud's. She was seen in the emergency room 11/13/11 with dizziness. Head CT demonstrated atrophy and chronic microvascular ischemic changes, no acute findings. MRI did not show anything acute. EKG demonstrated atrial fibrillation with a heart rate of 122. She did see her PCP in followup who adjusted her Toprol for rate control. Subsequently cardioverted by Dr Harrington Challenger 10/13   Last echo 03/22/13  EF normal 55% and intact MV repair with no residual MR    BP stable with beta blocker Seems like she has infrequent PAF Walking 2 miles with husband in mornings No bleeding issues  11/26/15 seen by primary for right leg pain ? DJD given steroids ABI's Normal  Seen by Ortho   Seen by primary 11/26/16 with some dysphagia especially with glucosamine tablets declined EGD    ROS: Denies fever, malais, weight loss, blurry vision, decreased visual acuity, cough, sputum, SOB, hemoptysis, pleuritic pain, palpitaitons, heartburn, abdominal pain, melena, lower extremity edema, claudication, or rash.  All other systems reviewed and negative  General: Affect appropriate Thin frail female  HEENT: normal Neck supple with no adenopathy JVP normal no bruits no thyromegaly Lungs clear with no wheezing and good diaphragmatic motion Heart:  S1/S2 no murmur, no rub, gallop or click PMI normal Abdomen: benighn, BS positve, no tenderness, no AAA no bruit.  No HSM or HJR Distal pulses intact with no bruits No edema Neuro non-focal Skin warm and dry No muscular weakness   Current Outpatient Prescriptions  Medication Sig Dispense Refill  . atorvastatin (LIPITOR) 20 MG tablet TAKE 1 TABLET BY  MOUTH EVERY EVENING 90 tablet 1  . Calcium Carbonate-Vitamin D (CALTRATE 600+D) 600-400 MG-UNIT per tablet Take 1 tablet by mouth 2 (two) times daily.      . Cholecalciferol (VITAMIN D) 2000 UNITS CAPS Take 2,000 Units by mouth daily.     . diclofenac sodium (VOLTAREN) 1 % GEL Apply 2 g topically 3 (three) times daily as needed. 100 g 3  . digoxin (LANOXIN) 0.125 MG tablet TAKE ONE-HALF TABLET BY MOUTH ONCE DAILY 30 tablet 5  . escitalopram (LEXAPRO) 10 MG tablet TAKE ONE TABLET BY MOUTH ONCE DAILY 30 tablet 5  . etodolac (LODINE) 400 MG tablet TAKE ONE TABLET BY MOUTH ONCE DAILY 90 tablet 2  . glucosamine-chondroitin 500-400 MG tablet Take 1 tablet by mouth 2 (two) times daily.      Marland Kitchen levothyroxine (SYNTHROID, LEVOTHROID) 75 MCG tablet TAKE ONE TABLET BY MOUTH ONCE DAILY BEFORE  BREAKFAST 90 tablet 3  . metoprolol (LOPRESSOR) 100 MG tablet Take 1 tablet (100 mg total) by mouth 2 (two) times daily. 180 tablet 2  . Multiple Vitamins-Minerals (PRESERVISION AREDS 2 PO) Take 1 tablet by mouth 2 (two) times daily. Reported on 09/04/2015    . rivaroxaban (XARELTO) 20 MG TABS tablet Take 1 tablet (20 mg total) by mouth daily with supper. 90 tablet 3  . saccharomyces boulardii (FLORASTOR) 250 MG capsule Take 250 mg by mouth 2 (two) times daily.    . vitamin A 10000 UNIT capsule Take 10,000 Units by mouth daily.     No current facility-administered medications for this visit.     Allergies  Rofecoxib  Electrocardiogram:  afib rate 75  11/16/13 05/15/14  afib  12/13/15 afib rate 71 no change  12/29/16  afib rate 61 nonspecific ST changes   Assessment and Plan MV Repair: SBE prophylaxis  No murmur on exam intact by echo 2015  PAF: On xarelto good rate control   Thyroid:  Lab Results  Component Value Date   TSH 1.13 06/10/2016   Chol:    Lab Results  Component Value Date   LDLCALC 45 03/05/2016   Leg Pain: ? DJD ABI's were normal no evidence of embolic event compliant with NOAC    Dysphagia:not interested in EGD cutting glucosamine tabs in half   [employer]

## 2016-12-29 ENCOUNTER — Ambulatory Visit (INDEPENDENT_AMBULATORY_CARE_PROVIDER_SITE_OTHER): Payer: Medicare Other | Admitting: Cardiovascular Disease

## 2016-12-29 ENCOUNTER — Encounter: Payer: Self-pay | Admitting: Cardiovascular Disease

## 2016-12-29 VITALS — BP 144/90 | HR 61 | Ht 61.0 in | Wt 108.0 lb

## 2016-12-29 DIAGNOSIS — I4819 Other persistent atrial fibrillation: Secondary | ICD-10-CM

## 2016-12-29 DIAGNOSIS — I481 Persistent atrial fibrillation: Secondary | ICD-10-CM

## 2016-12-29 DIAGNOSIS — I10 Essential (primary) hypertension: Secondary | ICD-10-CM | POA: Diagnosis not present

## 2016-12-29 NOTE — Patient Instructions (Signed)

## 2017-01-22 ENCOUNTER — Other Ambulatory Visit: Payer: Self-pay

## 2017-01-22 ENCOUNTER — Telehealth: Payer: Self-pay | Admitting: Family Medicine

## 2017-01-22 MED ORDER — ATORVASTATIN CALCIUM 20 MG PO TABS: 20.0000 mg | ORAL_TABLET | Freq: Every evening | ORAL | 1 refills | Status: DC

## 2017-01-22 MED ORDER — METOPROLOL TARTRATE 100 MG PO TABS: 100.0000 mg | ORAL_TABLET | Freq: Two times a day (BID) | ORAL | 2 refills | Status: DC

## 2017-01-22 NOTE — Telephone Encounter (Signed)
Noted. Confirmed they were sent today to pharmacy

## 2017-01-22 NOTE — Telephone Encounter (Signed)
Says Metoprolol and Atorvastatin was not sent to [facility].  Advised the scripts were placed today and to call [facility] to confirm.  Ty,  -LL

## 2017-01-23 NOTE — Telephone Encounter (Signed)
Pharmacy called in reference to not receiving Rx for Metoprolol. Pharmacy did receive the Atorvastatin. Please advise.  Phone # [phone] Fax # [phone]

## 2017-03-03 DIAGNOSIS — H353132 Nonexudative age-related macular degeneration, bilateral, intermediate dry stage: Secondary | ICD-10-CM | POA: Diagnosis not present

## 2017-03-03 DIAGNOSIS — H43813 Vitreous degeneration, bilateral: Secondary | ICD-10-CM | POA: Diagnosis not present

## 2017-03-03 DIAGNOSIS — H35372 Puckering of macula, left eye: Secondary | ICD-10-CM | POA: Diagnosis not present

## 2017-03-03 DIAGNOSIS — H348322 Tributary (branch) retinal vein occlusion, left eye, stable: Secondary | ICD-10-CM | POA: Diagnosis not present

## 2017-03-09 ENCOUNTER — Other Ambulatory Visit: Payer: Self-pay | Admitting: Family Medicine

## 2017-03-23 ENCOUNTER — Other Ambulatory Visit: Payer: Self-pay | Admitting: Family Medicine

## 2017-03-25 DIAGNOSIS — Z1231 Encounter for screening mammogram for malignant neoplasm of breast: Secondary | ICD-10-CM | POA: Diagnosis not present

## 2017-03-25 LAB — HM MAMMOGRAPHY

## 2017-03-27 ENCOUNTER — Encounter: Payer: Self-pay | Admitting: Family Medicine

## 2017-03-30 NOTE — Patient Instructions (Addendum)
Please stop by lab before you go  Schedule your bone density test at check out desk  Please start using your cane regularly  Please consider using Ensure/Boost or similar product sold at [employer] once a day to help with your weight and protein intake- higher protein may help maintain your current muscle mass better  Finding something for exercise can help keep your endurance up- perhaps water aerobics at [facility] or recumbent bike

## 2017-03-30 NOTE — Progress Notes (Signed)
Phone: [phone]  Subjective:  Patient presents today for their annual physical. Chief complaint-noted.   See problem oriented charting- ROS- full  review of systems was completed and negative except for:  shortness of breath with hills for sometime- not doing as many hills but with walking may feel more fatigued than she used to at [employer]- can walk around store holding cart though- if walks quickly may feel fatigued  The following were reviewed and entered/updated in epic: Past Medical History:  Diagnosis Date  . Atrial fibrillation (HCC)    AFIB  . Collagenous colitis   . CVA (cerebral vascular accident) ([place])    a.  L parietal CVA by MRI 07/86, likely embolic;  b.  TEE by report with neg bubble study;  c.  carotid dopplers  12/12:  + plaque, no sig ICA stenosis   . History of postmenopausal HRT   . Hx of adenomatous colonic polyps   . Hypothyroidism   . MVP (mitral valve prolapse)    a. s/p MV repair at [facility] in 2008;  b. Echocardiogram 7/12: EF 50%, status post mitral valve repair with mild MR and minimal MS, mean gradient 4, moderate LAE, LA diam 55 mm; mild RAE, PASP 28-32;   Marland Kitchen Osteoarthritis   . Osteoporosis    Patient Active Problem List   Diagnosis Date Noted  . TIA (transient ischemic attack) 03/21/2013    Priority: High  . Hx of ischemic left MCA stroke 04/08/2011    Priority: High  . Atrial fibrillation ([place]) 07/07/2007    Priority: High  . History of mitral valve repair 09/11/2006    Priority: High  . Hyperlipidemia 09/01/2014    Priority: Medium  . CKD (chronic kidney disease), stage III ([place]) 03/02/2014    Priority: Medium  . Depression 11/23/2013    Priority: Medium  . Hypertension 03/04/2012    Priority: Medium  . Hypothyroidism 11/23/2006    Priority: Medium  . Osteopenia 11/23/2006    Priority: Medium  . Osteoarthritis 09/11/2006    Priority: Medium  . Rosacea 11/23/2013    Priority: Low  . 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  05/30/2011    Priority: Low  . PERSONAL HX COLONIC POLYPS 10/22/2009    Priority: Low  . CHANGE IN BOWELS 09/20/2009    Priority: Low  . GASTROESOPHAGEAL REFLUX DISEASE, MILD 06/04/2009    Priority: Low  . Raynaud's syndrome 03/06/2009    Priority: Low  . SCOLIOSIS, LUMBAR SPINE 01/03/2009    Priority: Low  . Difficulty swallowing pills 11/26/2016   Past Surgical History:  Procedure Laterality Date  . CARDIOVERSION  01/09/2012   Procedure: CARDIOVERSION;  Surgeon: Jolaine Artist, MD;  Location: [facility] END[facility];  Service: Cardiovascular;  Laterality: N/A;  . CATARACT EXTRACTION    . CHOLECYSTECTOMY    . DILATION AND CURETTAGE OF UTERUS    . LUMBAR FUSION    . MITRAL VALVE REPAIR  2008  . TONSILLECTOMY      Family History  Problem Relation Age of Onset  . Cancer Father        COLON  . Stroke Neg Hx        1ST DEGREE RELATIVE <60    Medications- reviewed and updated Current Outpatient Medications  Medication Sig Dispense Refill  . atorvastatin (LIPITOR) 20 MG tablet Take 1 tablet (20 mg total) every evening by mouth. 90 tablet 1  . Calcium Carbonate-Vitamin D (CALTRATE 600+D) 600-400 MG-UNIT per tablet Take 1 tablet by mouth 2 (  two) times daily.      . Cholecalciferol (VITAMIN D) 2000 UNITS CAPS Take 2,000 Units by mouth daily.     . diclofenac sodium (VOLTAREN) 1 % GEL Apply 2 g topically 3 (three) times daily as needed. 100 g 3  . digoxin (LANOXIN) 0.125 MG tablet TAKE ONE-HALF TABLET BY MOUTH ONCE DAILY 30 tablet 5  . escitalopram (LEXAPRO) 10 MG tablet TAKE 1 TABLET BY MOUTH ONCE DAILY 30 tablet 5  . etodolac (LODINE) 400 MG tablet TAKE ONE TABLET BY MOUTH ONCE DAILY 90 tablet 2  . glucosamine-chondroitin 500-400 MG tablet Take 1 tablet by mouth 2 (two) times daily.      Marland Kitchen levothyroxine (SYNTHROID, LEVOTHROID) 75 MCG tablet TAKE ONE TABLET BY MOUTH ONCE DAILY BEFORE  BREAKFAST 90 tablet 3  . metoprolol tartrate (LOPRESSOR) 100 MG tablet Take 1 tablet (100 mg total) 2  (two) times daily by mouth. 180 tablet 2  . Multiple Vitamins-Minerals (PRESERVISION AREDS 2 PO) Take 1 tablet by mouth 2 (two) times daily. Reported on 09/04/2015    . rivaroxaban (XARELTO) 20 MG TABS tablet Take 1 tablet (20 mg total) by mouth daily with supper. 90 tablet 3  . saccharomyces boulardii (FLORASTOR) 250 MG capsule Take 250 mg by mouth 2 (two) times daily.    . vitamin A 10000 UNIT capsule Take 10,000 Units by mouth daily.     No current facility-administered medications for this visit.     Allergies-reviewed and updated Allergies  Allergen Reactions  . Rofecoxib     REACTION: Elevated LFT's    Social History   Socioeconomic History  . Marital status: Married    Spouse name: Not on file  . Number of children: Not on file  . Years of education: Not on file  . Highest education level: Not on file  Social Needs  . Financial resource strain: Not on file  . Food insecurity - worry: Not on file  . Food insecurity - inability: Not on file  . Transportation needs - medical: Not on file  . Transportation needs - non-medical: Not on file  Occupational History  . Occupation: RETIRED    Employer: RETIRED  Tobacco Use  . Smoking status: Former Smoker    Last attempt to quit: 04/28/1966    Years since quitting: 50.9  . Smokeless tobacco: Never Used  Substance and Sexual Activity  . Alcohol use: Yes    Alcohol/week: 0.6 oz    Types: 1 Glasses of wine per week    Comment: 1 small glass with luch and dinner  . Drug use: No  . Sexual activity: Not Currently  Other Topics Concern  . Not on file  Social History Narrative   Married for 14 years-1st marriage for both. No kids. No pets.       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 Gen: NAD, resting  comfortably HEENT: Mucous membranes are moist. Oropharynx normal Neck: no thyromegaly CV: RRR no murmurs rubs or gallops Lungs: CTAB no crackles, wheeze, rhonchi Abdomen: soft/nontender/nondistended/normal bowel sounds. No rebound or guarding.  Ext: no edema Skin: warm, dry Neuro: grossly normal, moves all extremities, PERRLA  Assessment/Plan:  84 y.o. female presenting for annual physical.  Health  Maintenance counseling: 1. Anticipatory guidance: Patient counseled regarding regular dental exams -q6 months, eye exams -Dr. Leonette Nutting once a year and also macular degeneration visit wearing seatbelts.  2. Risk factor reduction:  Advised patient of need for regular exercise and diet rich and fruits and vegetables to reduce risk of heart attack and stroke. Exercise- limited by arthritis- discussed options like recumbent bike or water aerobics. Diet- Was counseled at [facility] about actually gaining weight, up a few pounds Wt Readings from Last 3 Encounters:  12/29/16 108 lb (49 kg)  11/26/16 103 lb 12.8 oz (47.1 kg)  10/29/16 105 lb (47.6 kg)  3. Immunizations/screenings/ancillary studies- up to date including shingrix  Immunization History  Administered Date(s) Administered  . Influenza Split 12/17/2010, 12/02/2011  . Influenza Whole 03/17/2001, 12/18/2006, 12/28/2007, 01/03/2009, 12/26/2009  . Influenza, High Dose Seasonal PF 12/22/2012, 03/06/2015, 11/26/2015  . Influenza,inj,Quad PF,6+ Mos 11/23/2013  . Influenza-Unspecified 11/20/2016  . Pneumococcal Conjugate-13 03/02/2014  . Pneumococcal Polysaccharide-23 03/17/2000, 09/16/2006  . Td 03/18/1995, 09/15/2006  . Tdap 12/17/2010  . Zoster Recombinat (Shingrix) 09/18/2016, 11/20/2016  4. Cervical cancer screening-  passed age based screening 5. Breast cancer screening-  Wants to continue mammogram 03/25/17 normal- has dense breasts 6. Colon cancer screening - no further colonoscopies per GI despite polyp history.  She has not had rectal  bleeding  7. Skin cancer screening- Dr. Allyson Sabal prn - has appointment with new doctor in march. advised regular sunscreen use. Denies worrisome, changing, or new skin lesions.  8.  Osteoporosis screening at 60- Dexa 2010 with osteopenia- was supposed to have repeat last year- do not see in records. Do not have records of area- will refer under postmenopausal  Status of chronic or acute concerns   Dry macular degeneration- follows with optho  History of stroke and later TIA- on xarelto and atorvastatin without recurrence  History of mitral valve repair- done at [facility]  Atrial fibrillation- on digoxin, metoprolol 100mg  BID and doing well- stays in a fib as far as she knows. Xarelto for anticoagulation   Depression (major recurrent in full remission)- on lexapro and phq9 is 4 with no SI.   Hyperlipidemia- on atorvastatin 20mg  with last LDL 45. Needs repeat.   HTN- controlled on metoprolol   CKD III- update bmet. Control BP, lipids. Avoid nsaids ideally- but really enjoys the benefit of etodolac  Hypothyroidism- on levothyroxine 75 mcg. Update tsh  Severe OA hands- voltaren gel helps some- on etodolac in evenings. Aware of bleeding risks, kidney risks, heart risks. Some issues with grip due to level of arthritis  Sees Dr. Nelva Bush intermittently - last for sciatica and has had steroid injections (not really helping)- has not seen him since. Pain limits her walking at times- still having leg pain. Saw her for fatigue in march 2018 and workup largely reassuring- sent to PT under gait and balance training . This helped her some. Does not want to go back.   Trouble swallowing gluosamine tablets - we discussed using smaller tablets. No other issues with other food or pills.   Other issues 1. Rare right temple perhaps 2x a month under 30 seconds. Happening for a few years- similar to when it started. Perhaps 7/10. She will let us know if picks up in frequency, duration or intensity.  2. Some neck  pain in left neck- not exertional- sometimes worse with turning her head.  3. Has had some falls at home if gets up too fast (issues with low back pain into legs, dry  macular degeneration). Recommend she use her cane 4. Left elbow pain for the last year since the fall- hard to pick things up. Apparently had very bad fracture as child- has not had a lot of pain since that time. Discussed possible x-ray- she asked to hold off for now. voltaren gel has been tried and not the most helpful  No problem-specific Assessment & Plan notes found for this encounter.   Future Appointments  Date Time Provider [place]  03/31/2017  8:15 AM Marin Olp, MD [facility]  10/30/2017 10:00 AM Williemae Area, RN [facility]   No Follow-up on file.  Lab/Order associations: No diagnosis found.  No orders of the defined types were placed in this encounter.   Return precautions advised.  Garret Reddish, MD

## 2017-03-31 ENCOUNTER — Encounter: Payer: Self-pay | Admitting: Family Medicine

## 2017-03-31 ENCOUNTER — Ambulatory Visit (INDEPENDENT_AMBULATORY_CARE_PROVIDER_SITE_OTHER): Payer: Medicare Other | Admitting: Family Medicine

## 2017-03-31 VITALS — BP 142/76 | HR 74 | Temp 97.6°F | Ht 62.0 in | Wt 104.2 lb

## 2017-03-31 DIAGNOSIS — I481 Persistent atrial fibrillation: Secondary | ICD-10-CM

## 2017-03-31 DIAGNOSIS — E034 Atrophy of thyroid (acquired): Secondary | ICD-10-CM

## 2017-03-31 DIAGNOSIS — N183 Chronic kidney disease, stage 3 unspecified: Secondary | ICD-10-CM

## 2017-03-31 DIAGNOSIS — M19042 Primary osteoarthritis, left hand: Secondary | ICD-10-CM | POA: Diagnosis not present

## 2017-03-31 DIAGNOSIS — M19041 Primary osteoarthritis, right hand: Secondary | ICD-10-CM

## 2017-03-31 DIAGNOSIS — Z780 Asymptomatic menopausal state: Secondary | ICD-10-CM | POA: Diagnosis not present

## 2017-03-31 DIAGNOSIS — I4819 Other persistent atrial fibrillation: Secondary | ICD-10-CM

## 2017-03-31 DIAGNOSIS — Z0000 Encounter for general adult medical examination without abnormal findings: Secondary | ICD-10-CM | POA: Diagnosis not present

## 2017-03-31 DIAGNOSIS — H3530 Unspecified macular degeneration: Secondary | ICD-10-CM | POA: Diagnosis not present

## 2017-03-31 DIAGNOSIS — I10 Essential (primary) hypertension: Secondary | ICD-10-CM

## 2017-03-31 DIAGNOSIS — E785 Hyperlipidemia, unspecified: Secondary | ICD-10-CM | POA: Diagnosis not present

## 2017-03-31 LAB — COMPREHENSIVE METABOLIC PANEL
ALT: 18 U/L (ref 0–35)
AST: 21 U/L (ref 0–37)
Albumin: 4.4 g/dL (ref 3.5–5.2)
Alkaline Phosphatase: 104 U/L (ref 39–117)
BUN: 27 mg/dL — AB (ref 6–23)
CO2: 32 meq/L (ref 19–32)
Calcium: 9.8 mg/dL (ref 8.4–10.5)
Chloride: 103 mEq/L (ref 96–112)
Creatinine, Ser: 0.9 mg/dL (ref 0.40–1.20)
GFR: 63.43 mL/min (ref >60.00)
GLUCOSE: 98 mg/dL (ref 70–99)
POTASSIUM: 5 meq/L (ref 3.5–5.1)
SODIUM: 143 meq/L (ref 135–145)
Total Bilirubin: 0.9 mg/dL (ref 0.2–1.2)
Total Protein: 6.6 g/dL (ref 6.0–8.3)

## 2017-03-31 LAB — CBC
HCT: 43.5 % (ref 36.0–46.0)
HEMOGLOBIN: 14.3 g/dL (ref 12.0–15.0)
MCHC: 32.8 g/dL (ref 30.0–36.0)
MCV: 96.7 fl (ref 78.0–100.0)
PLATELETS: 188 10*3/uL (ref 150.0–400.0)
RBC: 4.5 Mil/uL (ref 3.87–5.11)
RDW: 13.7 % (ref 11.5–15.5)
WBC: 6.6 10*3/uL (ref 4.0–10.5)

## 2017-03-31 LAB — TSH: TSH: 1.72 u[IU]/mL (ref 0.35–4.50)

## 2017-03-31 LAB — LIPID PANEL
CHOL/HDL RATIO: 2
Cholesterol: 136 mg/dL (ref 0–200)
HDL: 72.6 mg/dL (ref >39.00)
LDL Cholesterol: 49 mg/dL (ref 0–99)
NONHDL: 63.05
Triglycerides: 71 mg/dL (ref 0.0–149.0)
VLDL: 14.2 mg/dL (ref 0.0–40.0)

## 2017-03-31 NOTE — Addendum Note (Signed)
Addended by: Frutoso Chase A on: 03/31/2017 09:05 AM   Modules accepted: Orders

## 2017-04-01 ENCOUNTER — Ambulatory Visit (INDEPENDENT_AMBULATORY_CARE_PROVIDER_SITE_OTHER)
Admission: RE | Admit: 2017-04-01 | Discharge: 2017-04-01 | Disposition: A | Discharge status: Home / Self Care | Payer: Medicare Other | Source: Ambulatory Visit | Attending: Family Medicine | Admitting: Family Medicine

## 2017-04-01 DIAGNOSIS — Z780 Asymptomatic menopausal state: Secondary | ICD-10-CM | POA: Diagnosis not present

## 2017-04-03 ENCOUNTER — Encounter: Payer: Self-pay | Admitting: Family Medicine

## 2017-04-05 DIAGNOSIS — Z780 Asymptomatic menopausal state: Secondary | ICD-10-CM | POA: Diagnosis not present

## 2017-05-19 DIAGNOSIS — L821 Other seborrheic keratosis: Secondary | ICD-10-CM | POA: Diagnosis not present

## 2017-05-19 DIAGNOSIS — L298 Other pruritus: Secondary | ICD-10-CM | POA: Diagnosis not present

## 2017-06-01 ENCOUNTER — Telehealth: Payer: Self-pay | Admitting: Family Medicine

## 2017-06-01 MED ORDER — LEVOTHYROXINE SODIUM 75 MCG PO TABS: ORAL_TABLET | ORAL | 3 refills | Status: DC

## 2017-06-01 NOTE — Telephone Encounter (Signed)
Copied from [place]. Topic: Quick Communication - Rx Refill/Question >> Jun 01, 2017 12:23 PM Oliver Pila B wrote: Medication:  levothyroxine (SYNTHROID, LEVOTHROID) 75 MCG tablet [[id number]]    Has the patient contacted their pharmacy? Yes.     (Agent: If no, request that the patient contact the pharmacy for the refill.)   Preferred Pharmacy (with phone number or street name): [facility]   Agent: Please be advised that RX refills may take up to 3 business days. We ask that you follow-up with your pharmacy.

## 2017-06-08 ENCOUNTER — Other Ambulatory Visit: Payer: Self-pay

## 2017-06-08 MED ORDER — RIVAROXABAN 20 MG PO TABS: 20.0000 mg | ORAL_TABLET | Freq: Every day | ORAL | 3 refills | Status: DC

## 2017-07-17 DIAGNOSIS — M19022 Primary osteoarthritis, left elbow: Secondary | ICD-10-CM | POA: Diagnosis not present

## 2017-07-17 DIAGNOSIS — M25422 Effusion, left elbow: Secondary | ICD-10-CM | POA: Diagnosis not present

## 2017-07-27 ENCOUNTER — Other Ambulatory Visit: Payer: Self-pay | Admitting: Family Medicine

## 2017-07-27 MED ORDER — ETODOLAC 400 MG PO TABS: 400.0000 mg | ORAL_TABLET | Freq: Every day | ORAL | 0 refills | Status: DC

## 2017-07-27 NOTE — Telephone Encounter (Signed)
Copied from [place] [id number]. Topic: Quick Communication - Rx Refill/Question >> Jul 27, 2017 10:16 AM Yvette Rack wrote: Medication: etodolac (LODINE) 400 MG tablet Has the patient contacted their pharmacy? No. (Agent: If no, request that the patient contact the pharmacy for the refill.) Preferred Pharmacy (with phone number or street name): [id number] [address], [place] [phone] (Phone) [phone] (Fax)  Agent: Please be advised that RX refills may take up to 3 business days. We ask that you follow-up with your pharmacy.

## 2017-08-13 ENCOUNTER — Other Ambulatory Visit: Payer: Self-pay

## 2017-08-13 MED ORDER — ATORVASTATIN CALCIUM 20 MG PO TABS: 20.0000 mg | ORAL_TABLET | Freq: Every evening | ORAL | 1 refills | Status: DC

## 2017-09-01 DIAGNOSIS — H353132 Nonexudative age-related macular degeneration, bilateral, intermediate dry stage: Secondary | ICD-10-CM | POA: Diagnosis not present

## 2017-09-01 DIAGNOSIS — H43813 Vitreous degeneration, bilateral: Secondary | ICD-10-CM | POA: Diagnosis not present

## 2017-09-01 DIAGNOSIS — H35372 Puckering of macula, left eye: Secondary | ICD-10-CM | POA: Diagnosis not present

## 2017-09-01 DIAGNOSIS — H348322 Tributary (branch) retinal vein occlusion, left eye, stable: Secondary | ICD-10-CM | POA: Diagnosis not present

## 2017-09-15 ENCOUNTER — Telehealth: Payer: Self-pay | Admitting: Family Medicine

## 2017-09-15 MED ORDER — ESCITALOPRAM OXALATE 10 MG PO TABS: 10.0000 mg | ORAL_TABLET | Freq: Every day | ORAL | 5 refills | Status: DC

## 2017-09-15 MED ORDER — DIGOXIN 125 MCG PO TABS: 62.5000 ug | ORAL_TABLET | Freq: Every day | ORAL | 5 refills | Status: DC

## 2017-09-15 NOTE — Telephone Encounter (Signed)
Copied from [place] [id number]. Topic: Quick Communication - Rx Refill/Question >> Sep 15, 2017  9:05 AM Bea Graff, NT wrote: Medication: digoxin (LANOXIN) 0.125 MG tablet and escitalopram (LEXAPRO) 10 MG tablet   Has the patient contacted their pharmacy? Yes.   (Agent: If no, request that the patient contact the pharmacy for the refill.) (Agent: If yes, when and what did the pharmacy advise?)  Preferred Pharmacy (with phone number or street name): [id number] [address], [place] [phone] (Phone) [phone] (Fax)    Requesting 90 day supply  Agent: Please be advised that RX refills may take up to 3 business days. We ask that you follow-up with your pharmacy.

## 2017-09-29 ENCOUNTER — Ambulatory Visit: Payer: Medicare Other | Admitting: Family Medicine

## 2017-09-29 ENCOUNTER — Encounter: Payer: Self-pay | Admitting: Family Medicine

## 2017-09-29 DIAGNOSIS — E034 Atrophy of thyroid (acquired): Secondary | ICD-10-CM | POA: Diagnosis not present

## 2017-09-29 DIAGNOSIS — N183 Chronic kidney disease, stage 3 unspecified: Secondary | ICD-10-CM

## 2017-09-29 DIAGNOSIS — E785 Hyperlipidemia, unspecified: Secondary | ICD-10-CM

## 2017-09-29 DIAGNOSIS — I10 Essential (primary) hypertension: Secondary | ICD-10-CM | POA: Diagnosis not present

## 2017-09-29 DIAGNOSIS — K219 Gastro-esophageal reflux disease without esophagitis: Secondary | ICD-10-CM | POA: Diagnosis not present

## 2017-09-29 DIAGNOSIS — I481 Persistent atrial fibrillation: Secondary | ICD-10-CM

## 2017-09-29 DIAGNOSIS — F3342 Major depressive disorder, recurrent, in full remission: Secondary | ICD-10-CM

## 2017-09-29 DIAGNOSIS — I4819 Other persistent atrial fibrillation: Secondary | ICD-10-CM

## 2017-09-29 LAB — BASIC METABOLIC PANEL
BUN: 38 mg/dL — AB (ref 6–23)
CO2: 31 meq/L (ref 19–32)
CREATININE: 0.91 mg/dL (ref 0.40–1.20)
Calcium: 9.9 mg/dL (ref 8.4–10.5)
Chloride: 103 mEq/L (ref 96–112)
GFR: 62.55 mL/min (ref >60.00)
Glucose, Bld: 96 mg/dL (ref 70–99)
Potassium: 4.8 mEq/L (ref 3.5–5.1)
SODIUM: 140 meq/L (ref 135–145)

## 2017-09-29 LAB — TSH: TSH: 1.19 u[IU]/mL (ref 0.35–4.50)

## 2017-09-29 MED ORDER — RANITIDINE HCL 150 MG PO TABS: 150.0000 mg | ORAL_TABLET | Freq: Two times a day (BID) | ORAL | 3 refills | Status: DC

## 2017-09-29 NOTE — Assessment & Plan Note (Signed)
S: remains on digoxin, metoprolol 100mg  BID. Xarelto for anticoagulation A/P:a ppears to remain in a fib- continue current rx. Slightly bradycardc but reasonable

## 2017-09-29 NOTE — Patient Instructions (Addendum)
Please stop by lab before you go  No changes in medications today other than zantac 150mg  twice a day if you do well with it to see if we can help with the cough/trouble swallowing sensation- a lot of times this is related to reflux

## 2017-09-29 NOTE — Assessment & Plan Note (Signed)
S: BP is controlled as are lipids. Does uses etodolac some but we hav ecounseled on risks for progression of CKD- uses  etodolac for hands in evenings and voltaren gel in days (we had reduced dosage and she is doing ok with that) A/P:update bmet today

## 2017-09-29 NOTE — Assessment & Plan Note (Signed)
S: major recurrent depression remains in full remission with lexapro 10 mg A/P: continue current rx- team will update phq9 which is under 5

## 2017-09-29 NOTE — Progress Notes (Signed)
Subjective:  Theresa [name] is a 84 y.o. year old very pleasant female patient who presents for/with See problem oriented charting ROS- No chest pain. Occasional shortness of breath over last few years- not worsening. No worsening blurry vision.   No edema  Past Medical History-  Patient Active Problem List   Diagnosis Date Noted  . TIA (transient ischemic attack) 03/21/2013    Priority: High  . Hx of ischemic left MCA stroke 04/08/2011    Priority: High  . Atrial fibrillation ([place]) 07/07/2007    Priority: High  . History of mitral valve repair 09/11/2006    Priority: High  . Hyperlipidemia 09/01/2014    Priority: Medium  . CKD (chronic kidney disease), stage III ([place]) 03/02/2014    Priority: Medium  . Major depression in full remission ([place]) 11/23/2013    Priority: Medium  . Hypertension 03/04/2012    Priority: Medium  . Hypothyroidism 11/23/2006    Priority: Medium  . Osteopenia 11/23/2006    Priority: Medium  . Osteoarthritis 09/11/2006    Priority: Medium  . Rosacea 11/23/2013    Priority: Low  . 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 PERSONAL HX COLONIC POLYPS 10/22/2009    Priority: Low  . CHANGE IN BOWELS 09/20/2009    Priority: Low  . GASTROESOPHAGEAL REFLUX DISEASE, MILD 06/04/2009    Priority: Low  . Raynaud's syndrome 03/06/2009    Priority: Low  . SCOLIOSIS, LUMBAR SPINE 01/03/2009    Priority: Low  . Macular degeneration 03/31/2017  . Difficulty swallowing pills 11/26/2016    Medications- reviewed and updated Current Outpatient Medications  Medication Sig Dispense Refill  . atorvastatin (LIPITOR) 20 MG tablet Take 1 tablet (20 mg total) by mouth every evening. 90 tablet 1  . Calcium Carbonate-Vitamin D (CALTRATE 600+D) 600-400 MG-UNIT per tablet Take 1 tablet by mouth 2 (two) times daily.      . Cholecalciferol (VITAMIN D) 2000 UNITS CAPS Take 2,000 Units by mouth daily.     . diclofenac sodium  (VOLTAREN) 1 % GEL Apply 2 g topically 3 (three) times daily as needed. 100 g 3  . digoxin (LANOXIN) 0.125 MG tablet Take 0.5 tablets (62.5 mcg total) by mouth daily. 30 tablet 5  . escitalopram (LEXAPRO) 10 MG tablet Take 1 tablet (10 mg total) by mouth daily. 30 tablet 5  . etodolac (LODINE) 400 MG tablet Take 1 tablet (400 mg total) by mouth daily. 90 tablet 0  . glucosamine-chondroitin 500-400 MG tablet Take 1 tablet by mouth 2 (two) times daily.      Marland Kitchen levothyroxine (SYNTHROID, LEVOTHROID) 75 MCG tablet TAKE ONE TABLET BY MOUTH ONCE DAILY BEFORE  BREAKFAST 90 tablet 3  . metoprolol tartrate (LOPRESSOR) 100 MG tablet Take 1 tablet (100 mg total) 2 (two) times daily by mouth. 180 tablet 2  . Multiple Vitamins-Minerals (PRESERVISION AREDS 2 PO) Take 1 tablet by mouth 2 (two) times daily. Reported on 09/04/2015    . rivaroxaban (XARELTO) 20 MG TABS tablet Take 1 tablet (20 mg total) by mouth daily with supper. 90 tablet 3  . vitamin A 10000 UNIT capsule Take 10,000 Units by mouth daily.    . ranitidine (ZANTAC) 150 MG tablet Take 1 tablet (150 mg total) by mouth 2 (two) times daily. 180 tablet 3   No current facility-administered medications for this visit.     Objective: BP 130/82 (BP Location: Left Arm, Patient Position: Sitting, Cuff Size: Normal)  Pulse (!) 58   Temp 97.8 F (36.6 C) (Oral)   Ht 5\' 3"  (1.6 m)   Wt 104 lb 6.4 oz (47.4 kg)   SpO2 97%   BMI 18.49 kg/m  Gen: NAD, resting comfortably CV: irregularly irregular no rubs or gallops Lungs: CTAB no crackles, wheeze, rhonchi Abdomen: soft/nontender/nondistended/normal bowel sounds.  Ext: no edema Skin: warm, dry  Assessment/Plan:  Other notes: 1.left Dry macular degeneration- follows with optho. Right eye ok 2. History of stroke and later TIA- on xarelto and atorvastatin without recurrence 3. History of mitral valve repair- done at [facility] 4. Has seen  Dr. Nelva Bush intermittently - last for sciatica and has had steroid  injections (not really helping)- has not seen him since. Pain limits her walking at times- still having leg pain. Saw her for fatigue in march 2018 and workup largely reassuring- sent to PT under gait and balance training . This helped her some. Does not want to go back.  5.  Left elbow pain- Working with chiropractor doing some traction and finds helpful and apparently oxygen therapy has helped raynauds type symptoms. Did x-ray with them showing arthritis and loose body- offered sports medicine referral which she declines.  6. Tinnitus particularly left ear noted . Likely due to hearing loss 7. Not using cane like we talked about- encouraged again  Atrial fibrillation S: remains on digoxin, metoprolol 100mg  BID. Xarelto for anticoagulation A/P:a ppears to remain in a fib- continue current rx. Slightly bradycardc but reasonable  Hypertension S: controlled on metoprolol alone BP Readings from Last 3 Encounters:  09/29/17 130/82  03/31/17 (!) 142/76  12/29/16 (!) 144/90  A/P: We discussed blood pressure goal of <140/90. Continue current meds  Hypothyroidism S: On thyroid medication-levothyroxine 75 mcg  Lab Results  Component Value Date   TSH 1.72 03/31/2017  A/P: update tsh  CKD (chronic kidney disease), stage III S: BP is controlled as are lipids. Does uses etodolac some but we hav ecounseled on risks for progression of CKD- uses  etodolac for hands in evenings and voltaren gel in days (we had reduced dosage and she is doing ok with that) A/P:update bmet today  Hyperlipidemia S: well controlled on atorvastatin 20mg  with last LDL 45 in january A/P: continue current medicine   Major depression in full remission ([place]) S: major recurrent depression remains in full remission with lexapro 10 mg A/P: continue current rx- team will update phq9 which is under 5  GASTROESOPHAGEAL REFLUX DISEASE, MILD S:Trouble swallowing gluosamine tablets - we discussed using smaller tablets. No other  issues with other food or pills. She feels like this has been getting worse- now sometimes food getting stuck but mostly pills A/P: will refer to GI for their opinion if she fails to improve with zantac 150mg  BID by follow up. Hold off on PPI with CKD III  She is not choking on food- just has feeling of fullness in throat with certain foods/meds  Future Appointments  Date Time Provider [place]  10/30/2017 10:00 AM [facility] CO[facility]   Return in about 3 months (around 12/30/2017) for follow up- or sooner if needed. Offered 6 months- she would like to follow more closely  Lab/Order associations: Persistent atrial fibrillation ([place])  Essential hypertension - Plan: Basic metabolic panel  CKD (chronic kidney disease), stage III (H[facility])  Recurrent major depressive disorder, in full remission ([place])  Hypothyroidism due to acquired atrophy of thyroid - Plan: TSH  Hyperlipidemia, unspecified hyperlipidemia type  Gastroesophageal reflux  disease without esophagitis  Meds ordered this encounter  Medications  . ranitidine (ZANTAC) 150 MG tablet    Sig: Take 1 tablet (150 mg total) by mouth 2 (two) times daily.    Dispense:  180 tablet    Refill:  3   Return precautions advised.  Garret Reddish, MD

## 2017-09-29 NOTE — Assessment & Plan Note (Signed)
S: well controlled on atorvastatin 20mg  with last LDL 45 in january A/P: continue current medicine

## 2017-09-29 NOTE — Assessment & Plan Note (Signed)
S: controlled on metoprolol alone BP Readings from Last 3 Encounters:  09/29/17 130/82  03/31/17 (!) 142/76  12/29/16 (!) 144/90  A/P: We discussed blood pressure goal of <140/90. Continue current meds

## 2017-09-29 NOTE — Assessment & Plan Note (Signed)
S: On thyroid medication-levothyroxine 75 mcg  Lab Results  Component Value Date   TSH 1.72 03/31/2017  A/P: update tsh

## 2017-09-29 NOTE — Assessment & Plan Note (Signed)
S:Trouble swallowing gluosamine tablets - we discussed using smaller tablets. No other issues with other food or pills. She feels like this has been getting worse- now sometimes food getting stuck but mostly pills A/P: will refer to GI for their opinion if she fails to improve with zantac 150mg  BID by follow up. Hold off on PPI with CKD III

## 2017-10-12 DIAGNOSIS — H52223 Regular astigmatism, bilateral: Secondary | ICD-10-CM | POA: Diagnosis not present

## 2017-10-20 ENCOUNTER — Other Ambulatory Visit: Payer: Self-pay | Admitting: Family Medicine

## 2017-10-30 ENCOUNTER — Ambulatory Visit (INDEPENDENT_AMBULATORY_CARE_PROVIDER_SITE_OTHER): Payer: Medicare Other | Admitting: *Deleted

## 2017-10-30 VITALS — BP 106/58 | HR 63 | Resp 16 | Ht 63.0 in | Wt 103.2 lb

## 2017-10-30 DIAGNOSIS — Z0000 Encounter for general adult medical examination without abnormal findings: Secondary | ICD-10-CM

## 2017-10-30 NOTE — Progress Notes (Signed)
Subjective:   Theresa [name] is a 84 y.o. female who presents for Medicare Annual (Subsequent) preventive examination.  Lives in one story home with husband.   Review of Systems:  No ROS.  Medicare Wellness Visit. Additional risk factors are reflected in the social history.  Cardiac Risk Factors include: advanced age (>66men, >42 women);dyslipidemia;family history of premature cardiovascular disease     Objective:     Vitals: BP (!) 106/58 (BP Location: Right Arm, Patient Position: Sitting, Cuff Size: Normal)   Pulse 63   Resp 16   Ht 5\' 3"  (1.6 m)   Wt 103 lb 3.2 oz (46.8 kg)   SpO2 100%   BMI 18.28 kg/m   Body mass index is 18.28 kg/m.  Advanced Directives 10/30/2017 10/29/2016 06/24/2016 09/28/2015 03/21/2013 01/09/2012  Does Patient Have a Medical Advance Directive? Yes Yes Yes Yes Patient has advance directive, copy not in chart Patient does not have advance directive  Type of Advance Directive [place];Living will - [place];Living will - [place];Living will -  Does patient want to make changes to medical advance directive? - - No - Patient declined - No -  Copy of [place] in Chart? Yes - Yes - Copy requested from family -  Pre-existing out of facility DNR order (yellow form or pink MOST form) - - - - No No    Tobacco Social History   Tobacco Use  Smoking Status Former Smoker  . Last attempt to quit: 04/28/1966  . Years since quitting: 51.5  Smokeless Tobacco Never Used     Counseling given: Not Answered  Past Medical History:  Diagnosis Date  . Atrial fibrillation (HCC)    AFIB  . Collagenous colitis   . CVA (cerebral vascular accident) ([place])    a.  L parietal CVA by MRI 83/15, likely embolic;  b.  TEE by report with neg bubble study;  c.  carotid dopplers  12/12:  + plaque, no sig ICA stenosis   . History of postmenopausal HRT   . Hx of adenomatous colonic polyps   .  Hypothyroidism   . MVP (mitral valve prolapse)    a. s/p MV repair at [facility] in 2008;  b. Echocardiogram 7/12: EF 50%, status post mitral valve repair with mild MR and minimal MS, mean gradient 4, moderate LAE, LA diam 55 mm; mild RAE, PASP 28-32;   Marland Kitchen Osteoarthritis   . Osteoporosis    Past Surgical History:  Procedure Laterality Date  . CARDIOVERSION  01/09/2012   Procedure: CARDIOVERSION;  Surgeon: Jolaine Artist, MD;  Location: [facility] ENDOSCOPY;  Service: Cardiovascular;  Laterality: N/A;  . CATARACT EXTRACTION    . CHOLECYSTECTOMY    . DILATION AND CURETTAGE OF UTERUS    . LUMBAR FUSION    . MITRAL VALVE REPAIR  2008  . TONSILLECTOMY     Family History  Problem Relation Age of Onset  . Cancer Father        COLON  . Stroke Neg Hx        1ST DEGREE RELATIVE <60   Social History   Socioeconomic History  . Marital status: Married    Spouse name: Not on file  . Number of children: Not on file  . Years of education: Not on file  . Highest education level: Master's degree (e.g., MA, MS, MEng, MEd, MSW, MBA)  Occupational History  . Occupation: RETIRED    Employer: RETIRED  Comment: voice teacher  Social Needs  . Financial resource strain: Not on file  . Food insecurity:    Worry: Not on file    Inability: Not on file  . Transportation needs:    Medical: Not on file    Non-medical: Not on file  Tobacco Use  . Smoking status: Former Smoker    Last attempt to quit: 04/28/1966    Years since quitting: 51.5  . Smokeless tobacco: Never Used  Substance and Sexual Activity  . Alcohol use: Yes    Alcohol/week: 1.0 standard drinks    Types: 1 Glasses of wine per week    Comment: 1 small glass with luch and dinner  . Drug use: No  . Sexual activity: Not Currently  Lifestyle  . Physical activity:    Days per week: Not on file    Minutes per session: Not on file  . Stress: Not on file  Relationships  . Social connections:    Talks on phone: Not on file    Gets together:  Not on file    Attends religious service: Not on file    Active member of club or organization: Not on file    Attends meetings of clubs or organizations: Not on file    Relationship status: Not on file  Other Topics Concern  . Not on file  Social History Narrative   Married for 30 years-1st marriage for both. No kids. No pets.       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 atorvastatin (LIPITOR) 20 MG tablet Take 1 tablet (20 mg total) by mouth every evening.  . Calcium Carbonate-Vitamin D (CALTRATE 600+D) 600-400 MG-UNIT per tablet Take 1 tablet by mouth 2 (two) times daily.    . Cholecalciferol (VITAMIN D) 2000 UNITS CAPS Take 2,000 Units by mouth daily.   . diclofenac sodium (VOLTAREN) 1 % GEL Apply 2 g topically 3 (three) times daily as needed.  . digoxin (LANOXIN) 0.125 MG tablet Take 0.5 tablets (62.5 mcg total) by mouth daily.  Marland Kitchen escitalopram (LEXAPRO) 10 MG tablet Take 1 tablet (10 mg total) by mouth daily.  Marland Kitchen etodolac (LODINE) 400 MG tablet TAKE 1 TABLET BY MOUTH DAILY  . glucosamine-chondroitin 500-400 MG tablet Take 1 tablet by mouth 2 (two) times daily.    Marland Kitchen levothyroxine (SYNTHROID, LEVOTHROID) 75 MCG tablet TAKE ONE TABLET BY MOUTH ONCE DAILY BEFORE  BREAKFAST  . metoprolol tartrate (LOPRESSOR) 100 MG tablet Take 1 tablet (100 mg total) 2 (two) times daily by mouth.  . Multiple Vitamins-Minerals (PRESERVISION AREDS 2 PO) Take 1 tablet by mouth 2 (two) times daily. Reported on 09/04/2015  . ranitidine (ZANTAC) 150 MG tablet Take 1 tablet (150 mg total) by mouth 2 (two) times daily.  . rivaroxaban (XARELTO) 20 MG TABS tablet Take 1 tablet (20 mg total) by mouth daily with supper.  . vitamin A 10000 UNIT capsule Take 10,000 Units by mouth  daily.   No facility-administered encounter medications on file as of 10/30/2017.     Activities of Daily Living In your present state of health, do you have any difficulty performing the following activities: 10/30/2017  Hearing? N  Vision? Y  Difficulty concentrating or  making decisions? N  Walking or climbing stairs? N  Dressing or bathing? N  Doing errands, shopping? Y  Preparing Food and eating ? N  Using the Toilet? N  In the past six months, have you accidently leaked urine? N  Do you have problems with loss of bowel control? N  Managing your Medications? N  Managing your Finances? N  Housekeeping or managing your Housekeeping? N  Some recent data might be hidden    Patient Care Team: Marin Olp, MD as PCP - General (Family Medicine) Shon Hough, MD as Consulting Physician (Ophthalmology) Ricki Miller, Tana Felts, MD as Referring Physician (Ophthalmology) Josue Hector, MD as Consulting Physician (Cardiology)    Assessment:   This is a routine wellness examination for Theresa [name].  Exercise Activities and Dietary recommendations Current Exercise Habits: The patient does not participate in regular exercise at present, Exercise limited by: None identified  Goals    . patient     Did like to travel; think about options; Private trip with other; plan something away this year     . patient     Improve balance I will send EMMI information [email or web address]          Fall Risk Fall Risk  10/30/2017 10/29/2016 09/28/2015 09/01/2014 03/25/2013  Falls in the past year? No Yes No No Yes  Number falls in past yr: - 1 - - 1  Injury with Fall? - - - - No  Risk for fall due to : Impaired balance/gait - - - Impaired mobility  Follow up - Education provided - - -   Depression Screen PHQ 2/9 Scores 10/30/2017 09/29/2017 10/29/2016 03/05/2016  PHQ - 2 Score - 0 0 0  PHQ- 9 Score - 5 - -  Exception Documentation Other- indicate reason in comment box - - -  Not completed  Completed one month ago - - -     Cognitive Function MMSE - Mini Mental State Exam 10/30/2017 10/29/2016 09/28/2015 02/15/2014  Not completed: - - (No Data) -  Orientation to time 4 5 4 5   Orientation to Place 5 5 5 5   Registration 3 3 3 3   Attention/ Calculation 5 5 5 3   Recall 2 3 2 3   Language- name 2 objects 2 2 2 2   Language- repeat 1 1 1 1   Language- follow 3 step command 3 3 3 3   Language- read & follow direction 1 1 1 1   Write a sentence 1 1 1 1   Copy design 1 1 1 1   Total score 28 30 28 28         Immunization History  Administered Date(s) Administered  . Influenza Split 12/17/2010, 12/02/2011  . Influenza Whole 03/17/2001, 12/18/2006, 12/28/2007, 01/03/2009, 12/26/2009  . Influenza, High Dose Seasonal PF 12/22/2012, 03/06/2015, 11/26/2015  . Influenza,inj,Quad PF,6+ Mos 11/23/2013  . Influenza-Unspecified 11/20/2016  . Pneumococcal Conjugate-13 03/02/2014  . Pneumococcal Polysaccharide-23 03/17/2000, 09/16/2006  . Td 03/18/1995, 09/15/2006  . Tdap 12/17/2010  . Zoster Recombinat (Shingrix) 09/18/2016, 11/20/2016   Screening Tests Health Maintenance  Topic Date Due  . INFLUENZA VACCINE  10/15/2017  . TETANUS/TDAP  12/16/2020  . DEXA SCAN  Completed  . PNA vac Low Risk Adult  Completed      Plan:   Follow up with PCP as directed.  I have personally reviewed and noted the following in the patient's chart:   . Medical and social history . Use of alcohol, tobacco or illicit drugs  . Current medications and supplements .  Functional ability and status . Nutritional status . Physical activity . Advanced directives . List of other physicians . Vitals . Screenings to include cognitive, depression, and falls . Referrals and appointments  In addition, I have reviewed and discussed with patient certain preventive protocols, quality metrics, and best practice recommendations. A written personalized care plan for preventive services as well as general preventive  health recommendations were provided to patient.     Williemae Area, RN  10/30/2017

## 2017-10-30 NOTE — Patient Instructions (Addendum)
Theresa [name] , Thank you for taking time to come for your Medicare Wellness Visit. I appreciate your ongoing commitment to your health goals. Please review the following plan we discussed and let me know if I can assist you in the future.   These are the goals we discussed: Goals    . patient     Did like to travel; think about options; Private trip with other; plan something away this year     . patient     Improve balance I will send EMMI information [email or web address]          This is a list of the screening recommended for you and due dates:  Health Maintenance  Topic Date Due  . Flu Shot  10/15/2017  . Tetanus Vaccine  12/16/2020  . DEXA scan (bone density measurement)  Completed  . Pneumonia vaccines  Completed   Preventive Care for Adults  A healthy lifestyle and preventive care can promote health and wellness. Preventive health guidelines for adults include the following key practices.  . A routine yearly physical is a good way to check with your health care provider about your health and preventive screening. It is a chance to share any concerns and updates on your health and to receive a thorough exam.  . Visit your dentist for a routine exam and preventive care every 6 months. Brush your teeth twice a day and floss once a day. Good oral hygiene prevents tooth decay and gum disease.  . The frequency of eye exams is based on your age, health, family medical history, use  of contact lenses, and other factors. Follow your health care provider's recommendations for frequency of eye exams.  . Eat a healthy diet. Foods like vegetables, fruits, whole grains, low-fat dairy products, and lean protein foods contain the nutrients you need without too many calories. Decrease your intake of foods high in solid fats, added sugars, and salt. Eat the right amount of calories for you. Get information about a proper diet from your health care provider, if necessary.  . Regular  physical exercise is one of the most important things you can do for your health. Most adults should get at least 150 minutes of moderate-intensity exercise (any activity that increases your heart rate and causes you to sweat) each week. In addition, most adults need muscle-strengthening exercises on 2 or more days a week.  Silver Sneakers may be a benefit available to you. To determine eligibility, you may visit the website: www.silversneakers.com or contact program at 954-147-2167 Mon-Fri between 8AM-8PM.   . Maintain a healthy weight. The body mass index (BMI) is a screening tool to identify possible weight problems. It provides an estimate of body fat based on height and weight. Your health care provider can find your BMI and can help you achieve or maintain a healthy weight.   For adults 20 years and older: ? A BMI below 18.5 is considered underweight. ? A BMI of 18.5 to 24.9 is normal. ? A BMI of 25 to 29.9 is considered overweight. ? A BMI of 30 and above is considered obese.   . Maintain normal blood lipids and cholesterol levels by exercising and minimizing your intake of saturated fat. Eat a balanced diet with plenty of fruit and vegetables. Blood tests for lipids and cholesterol should begin at age 73 and be repeated every 5 years. If your lipid or cholesterol levels are high, you are over 50, or you are at high risk for heart  disease, you may need your cholesterol levels checked more frequently. Ongoing high lipid and cholesterol levels should be treated with medicines if diet and exercise are not working.  . If you smoke, find out from your health care provider how to quit. If you do not use tobacco, please do not start.  . If you choose to drink alcohol, please do not consume more than 2 drinks per day. One drink is considered to be 12 ounces (355 mL) of beer, 5 ounces (148 mL) of wine, or 1.5 ounces (44 mL) of liquor.  . If you are 28-25 years old, ask your health care provider if  you should take aspirin to prevent strokes.  . Use sunscreen. Apply sunscreen liberally and repeatedly throughout the day. You should seek shade when your shadow is shorter than you. Protect yourself by wearing long sleeves, pants, a wide-brimmed hat, and sunglasses year round, whenever you are outdoors.  . Once a month, do a whole body skin exam, using a mirror to look at the skin on your back. Tell your health care provider of new moles, moles that have irregular borders, moles that are larger than a pencil eraser, or moles that have changed in shape or color.   Fall Prevention in the Home Falls can cause injuries. They can happen to people of all ages. There are many things you can do to make your home safe and to help prevent falls. What can I do on the outside of my home?  Regularly fix the edges of walkways and driveways and fix any cracks.  Remove anything that might make you trip as you walk through a door, such as a raised step or threshold.  Trim any bushes or trees on the path to your home.  Use bright outdoor lighting.  Clear any walking paths of anything that might make someone trip, such as rocks or tools.  Regularly check to see if handrails are loose or broken. Make sure that both sides of any steps have handrails.  Any raised decks and porches should have guardrails on the edges.  Have any leaves, snow, or ice cleared regularly.  Use sand or salt on walking paths during winter.  Clean up any spills in your garage right away. This includes oil or grease spills. What can I do in the bathroom?  Use night lights.  Install grab bars by the toilet and in the tub and shower. Do not use towel bars as grab bars.  Use non-skid mats or decals in the tub or shower.  If you need to sit down in the shower, use a plastic, non-slip stool.  Keep the floor dry. Clean up any water that spills on the floor as soon as it happens.  Remove soap buildup in the tub or shower  regularly.  Attach bath mats securely with double-sided non-slip rug tape.  Do not have throw rugs and other things on the floor that can make you trip. What can I do in the bedroom?  Use night lights.  Make sure that you have a light by your bed that is easy to reach.  Do not use any sheets or blankets that are too big for your bed. They should not hang down onto the floor.  Have a firm chair that has side arms. You can use this for support while you get dressed.  Do not have throw rugs and other things on the floor that can make you trip. What can I do in the  kitchen?  Clean up any spills right away.  Avoid walking on wet floors.  Keep items that you use a lot in easy-to-reach places.  If you need to reach something above you, use a strong step stool that has a grab bar.  Keep electrical cords out of the way.  Do not use floor polish or wax that makes floors slippery. If you must use wax, use non-skid floor wax.  Do not have throw rugs and other things on the floor that can make you trip. What can I do with my stairs?  Do not leave any items on the stairs.  Make sure that there are handrails on both sides of the stairs and use them. Fix handrails that are broken or loose. Make sure that handrails are as long as the stairways.  Check any carpeting to make sure that it is firmly attached to the stairs. Fix any carpet that is loose or worn.  Avoid having throw rugs at the top or bottom of the stairs. If you do have throw rugs, attach them to the floor with carpet tape.  Make sure that you have a light switch at the top of the stairs and the bottom of the stairs. If you do not have them, ask someone to add them for you. What else can I do to help prevent falls?  Wear shoes that: ? Do not have high heels. ? Have rubber bottoms. ? Are comfortable and fit you well. ? Are closed at the toe. Do not wear sandals.  If you use a stepladder: ? Make sure that it is fully  opened. Do not climb a closed stepladder. ? Make sure that both sides of the stepladder are locked into place. ? Ask someone to hold it for you, if possible.  Clearly mark and make sure that you can see: ? Any grab bars or handrails. ? First and last steps. ? Where the edge of each step is.  Use tools that help you move around (mobility aids) if they are needed. These include: ? Canes. ? Walkers. ? Scooters. ? Crutches.  Turn on the lights when you go into a dark area. Replace any light bulbs as soon as they burn out.  Set up your furniture so you have a clear path. Avoid moving your furniture around.  If any of your floors are uneven, fix them.  If there are any pets around you, be aware of where they are.  Review your medicines with your doctor. Some medicines can make you feel dizzy. This can increase your chance of falling. Ask your doctor what other things that you can do to help prevent falls. This information is not intended to replace advice given to you by your health care provider. Make sure you discuss any questions you have with your health care provider. Document Released: 12/28/2008 Document Revised: 08/09/2015 Document Reviewed: 04/07/2014 Elsevier Interactive Patient Education  [employer].

## 2017-10-30 NOTE — Progress Notes (Signed)
I have reviewed and agree with note, evaluation, plan. Consider PPI at follow up since zantac not helping  Garret Reddish, MD

## 2017-10-30 NOTE — Progress Notes (Signed)
PCP notes:   Health maintenance: Up to date.    Abnormal screenings: None.    Patient concerns: Patient states the Zantac has not helped with her cough, she is still taking it but has not noticed a big difference. Declined to follow up for this.   Nurse concerns: None.    Next PCP appt: 12/30/2017

## 2017-11-29 ENCOUNTER — Encounter: Payer: Self-pay | Admitting: Family Medicine

## 2017-11-30 ENCOUNTER — Other Ambulatory Visit: Payer: Self-pay

## 2017-11-30 MED ORDER — METOPROLOL TARTRATE 100 MG PO TABS: 100.0000 mg | ORAL_TABLET | Freq: Two times a day (BID) | ORAL | 2 refills | Status: DC

## 2017-12-09 ENCOUNTER — Encounter: Payer: Self-pay | Admitting: Family Medicine

## 2017-12-09 ENCOUNTER — Ambulatory Visit (INDEPENDENT_AMBULATORY_CARE_PROVIDER_SITE_OTHER): Payer: Medicare Other

## 2017-12-09 DIAGNOSIS — Z23 Encounter for immunization: Secondary | ICD-10-CM

## 2017-12-30 ENCOUNTER — Ambulatory Visit: Payer: Medicare Other | Admitting: Family Medicine

## 2017-12-30 ENCOUNTER — Encounter: Payer: Self-pay | Admitting: Family Medicine

## 2017-12-30 VITALS — BP 130/72 | HR 63 | Ht 63.0 in | Wt 104.2 lb

## 2017-12-30 DIAGNOSIS — N183 Chronic kidney disease, stage 3 unspecified: Secondary | ICD-10-CM

## 2017-12-30 DIAGNOSIS — E034 Atrophy of thyroid (acquired): Secondary | ICD-10-CM | POA: Diagnosis not present

## 2017-12-30 DIAGNOSIS — I10 Essential (primary) hypertension: Secondary | ICD-10-CM

## 2017-12-30 DIAGNOSIS — Z681 Body mass index (BMI) 19 or less, adult: Secondary | ICD-10-CM

## 2017-12-30 DIAGNOSIS — I4811 Longstanding persistent atrial fibrillation: Secondary | ICD-10-CM

## 2017-12-30 NOTE — Assessment & Plan Note (Signed)
S: Fortunately last used GFR is eventually been in 60 range.  She does use etodolac some which increases risk of progression of CKD.  She tries to use just Voltaren gel in the day while using etodolac in the evening. A/P: Update bmet today. If remains in 78s- change to CKD II next visit

## 2017-12-30 NOTE — Assessment & Plan Note (Signed)
S: Remains on digoxin and metoprolol 100 mg twice daily.  She is on Xarelto for anticoagulation. A/P: She remains in atrial fibrillation it appears.  Continue current medication

## 2017-12-30 NOTE — Patient Instructions (Addendum)
Double check with your pharmacy to make sure there was not a recall on your zantac/ranitidine  Lets skip labs today since looked so good last 2 checks and we can check when we see you back next visit  Great to see you today! Glad you are doing so well- wish we had a better solution for the balance issues and/or the vision

## 2017-12-30 NOTE — Progress Notes (Signed)
Subjective:  Theresa [name] is a 84 y.o. year old very pleasant female patient who presents for/with See problem oriented charting ROS- balance issues. No edema. Poor vision due to macular degeneration. No reported chest pain    Past Medical History-  Patient Active Problem List   Diagnosis Date Noted  . TIA (transient ischemic attack) 03/21/2013    Priority: High  . Hx of ischemic left MCA stroke 04/08/2011    Priority: High  . Atrial fibrillation ([place]) 07/07/2007    Priority: High  . History of mitral valve repair 09/11/2006    Priority: High  . Hyperlipidemia 09/01/2014    Priority: Medium  . CKD (chronic kidney disease), stage III ([place]) 03/02/2014    Priority: Medium  . Major depression in full remission ([place]) 11/23/2013    Priority: Medium  . Hypertension 03/04/2012    Priority: Medium  . Hypothyroidism 11/23/2006    Priority: Medium  . Osteopenia 11/23/2006    Priority: Medium  . Osteoarthritis 09/11/2006    Priority: Medium  . Rosacea 11/23/2013    Priority: Low  . 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 PERSONAL HX COLONIC POLYPS 10/22/2009    Priority: Low  . CHANGE IN BOWELS 09/20/2009    Priority: Low  . GASTROESOPHAGEAL REFLUX DISEASE, MILD 06/04/2009    Priority: Low  . Raynaud's syndrome 03/06/2009    Priority: Low  . SCOLIOSIS, LUMBAR SPINE 01/03/2009    Priority: Low  . Macular degeneration 03/31/2017  . Difficulty swallowing pills 11/26/2016    Medications- reviewed and updated Current Outpatient Medications  Medication Sig Dispense Refill  . atorvastatin (LIPITOR) 20 MG tablet Take 1 tablet (20 mg total) by mouth every evening. 90 tablet 1  . Calcium Carbonate-Vitamin D (CALTRATE 600+D) 600-400 MG-UNIT per tablet Take 1 tablet by mouth 2 (two) times daily.      . Cholecalciferol (VITAMIN D) 2000 UNITS CAPS Take 2,000 Units by mouth daily.     . diclofenac sodium (VOLTAREN) 1 % GEL Apply 2 g  topically 3 (three) times daily as needed. 100 g 3  . digoxin (LANOXIN) 0.125 MG tablet Take 0.5 tablets (62.5 mcg total) by mouth daily. 30 tablet 5  . escitalopram (LEXAPRO) 10 MG tablet Take 1 tablet (10 mg total) by mouth daily. 30 tablet 5  . etodolac (LODINE) 400 MG tablet TAKE 1 TABLET BY MOUTH DAILY 90 tablet 1  . glucosamine-chondroitin 500-400 MG tablet Take 1 tablet by mouth 2 (two) times daily.      Marland Kitchen levothyroxine (SYNTHROID, LEVOTHROID) 75 MCG tablet TAKE ONE TABLET BY MOUTH ONCE DAILY BEFORE  BREAKFAST 90 tablet 3  . metoprolol tartrate (LOPRESSOR) 100 MG tablet Take 1 tablet (100 mg total) by mouth 2 (two) times daily. 180 tablet 2  . Multiple Vitamins-Minerals (PRESERVISION AREDS 2 PO) Take 1 tablet by mouth 2 (two) times daily. Reported on 09/04/2015    . ranitidine (ZANTAC) 150 MG tablet Take 1 tablet (150 mg total) by mouth 2 (two) times daily. 180 tablet 3  . rivaroxaban (XARELTO) 20 MG TABS tablet Take 1 tablet (20 mg total) by mouth daily with supper. 90 tablet 3  . vitamin A 10000 UNIT capsule Take 10,000 Units by mouth daily.     No current facility-administered medications for this visit.     Objective: BP 130/72 (BP Location: Right Arm, Patient Position: Sitting, Cuff Size: Large)   Pulse 63   Ht 5\' 3"  (1.6  m)   Wt 104 lb 3.2 oz (47.3 kg)   SpO2 96%   BMI 18.46 kg/m  Gen: NAD, resting comfortably Tympanic membranes normal bilaterally.  Oropharynx largely normal CV: RRR no murmurs rubs or gallops Lungs: CTAB no crackles, wheeze, rhonchi Abdomen: soft/nontender/nondistended/normal bowel sounds Ext: no edema Skin: warm, dry, no rash  Assessment/Plan:  Other notes: 1. gait and balance training with PT was not very helpful in past. Working with a Restaurant manager, fast food on this. Finishing up a plan that she is doing. She has enough to hang onto in house- doesn't want to use cane.  2. CBD cream from chiropractor has been helping joints. Using voltaren slightly less.  3.  Depression controlled on lexapro. Discussed possibly using 5 mg since she has done so well just wants to maintain current dose 4. Macular degeneration likely plays into balance issues 5. BMI noted at under 19- we discussed using protein drink 1-2x a day. alrady using premier protein once a day- encouraged continue at least once a day and consider twice a day.   Hypertension  s: controlled on metoprolol alone once again BP Readings from Last 3 Encounters:  12/30/17 130/72  10/30/17 (!) 106/58  09/29/17 130/82  A/P: We discussed blood pressure goal of <140/90. Continue current meds   Hypothyroidism  s: On thyroid medication-controlled with levothyroxine 75 mcg on last check Lab Results  Component Value Date   TSH 1.19 09/29/2017  A/P: Continue current medication.   Atrial fibrillation S: Remains on digoxin and metoprolol 100 mg twice daily.  She is on Xarelto for anticoagulation. A/P: She remains in atrial fibrillation it appears.  Continue current medication  CKD (chronic kidney disease), stage III ([place]) S: Fortunately last used GFR is eventually been in 60 range.  She does use etodolac some which increases risk of progression of CKD.  She tries to use just Voltaren gel in the day while using etodolac in the evening. A/P: Update bmet today. If remains in 60s- change to CKD II next visit  Future Appointments  Date Time Provider [place]  02/04/2018 10:45 AM Josue Hector, MD [facility]  11/10/2018 10:00 AM [facility]   Return in 4 months (on 05/02/2018) for physical.  Lab/Order associations: Longstanding persistent atrial fibrillation  Hypothyroidism due to acquired atrophy of thyroid  Essential hypertension  CKD (chronic kidney disease), stage III ([place])  Return precautions advised.  Garret Reddish, MD [facility]

## 2018-01-07 ENCOUNTER — Other Ambulatory Visit: Payer: Self-pay | Admitting: Family Medicine

## 2018-01-27 ENCOUNTER — Encounter: Payer: Self-pay | Admitting: Family Medicine

## 2018-01-31 NOTE — Progress Notes (Signed)
Patient ID: Theresa [name], female   DOB: 05/27/1931, 84 y.o.   MRN: [id number]   84 y.o.  has a history of mitral regurgitation, status post mitral valve repair at [facility] in 2008, chronic atrial fibrillation , prior stroke in 12/12 at [facility] (felt to be embolic), on  Xarelto for anticoagulation   Last echo 03/22/13  EF normal 55% and intact MV repair with no residual MR    11/26/15 seen by primary for right leg pain ? DJD given steroids ABI's Normal  Seen by Ortho   Seen by primary 11/26/16 with some dysphagia especially with glucosamine tablets declined EGD   She has poor balance and vision from macular degeneration  Some dizziness and gait issues Has not had digoxin level check in a while as far as I can tell  ROS: Denies fever, malais, weight loss, blurry vision, decreased visual acuity, cough, sputum, SOB, hemoptysis, pleuritic pain, palpitaitons, heartburn, abdominal pain, melena, lower extremity edema, claudication, or rash.  All other systems reviewed and negative  General: Affect appropriate Thin frail female  HEENT: normal Neck supple with no adenopathy JVP normal no bruits no thyromegaly Lungs clear with no wheezing and good diaphragmatic motion Heart:  S1/S2 no murmur, no rub, gallop or click PMI normal Abdomen: benighn, BS positve, no tenderness, no AAA no bruit.  No HSM or HJR Distal pulses intact with no bruits No edema Neuro non-focal Skin warm and dry No muscular weakness   Current Outpatient Medications  Medication Sig Dispense Refill  . atorvastatin (LIPITOR) 20 MG tablet TAKE 1 TABLET BY MOUTH EVERY EVENING 90 tablet 1  . Calcium Carbonate-Vitamin D (CALTRATE 600+D) 600-400 MG-UNIT per tablet Take 1 tablet by mouth 2 (two) times daily.      . Cholecalciferol (VITAMIN D) 2000 UNITS CAPS Take 2,000 Units by mouth daily.     . diclofenac sodium (VOLTAREN) 1 % GEL Apply 2 g topically 3 (three) times daily as needed. 100 g 3  . digoxin  (LANOXIN) 0.125 MG tablet Take 0.5 tablets (62.5 mcg total) by mouth daily. 30 tablet 5  . escitalopram (LEXAPRO) 10 MG tablet Take 1 tablet (10 mg total) by mouth daily. 30 tablet 5  . etodolac (LODINE) 400 MG tablet TAKE 1 TABLET BY MOUTH DAILY 90 tablet 1  . glucosamine-chondroitin 500-400 MG tablet Take 1 tablet by mouth 2 (two) times daily.      Marland Kitchen levothyroxine (SYNTHROID, LEVOTHROID) 75 MCG tablet TAKE ONE TABLET BY MOUTH ONCE DAILY BEFORE  BREAKFAST 90 tablet 3  . metoprolol tartrate (LOPRESSOR) 100 MG tablet Take 1 tablet (100 mg total) by mouth 2 (two) times daily. 180 tablet 2  . Multiple Vitamins-Minerals (PRESERVISION AREDS 2 PO) Take 1 tablet by mouth 2 (two) times daily. Reported on 09/04/2015    . ranitidine (ZANTAC) 150 MG tablet Take 1 tablet (150 mg total) by mouth 2 (two) times daily. 180 tablet 3  . rivaroxaban (XARELTO) 20 MG TABS tablet Take 1 tablet (20 mg total) by mouth daily with supper. 90 tablet 3  . vitamin A 10000 UNIT capsule Take 10,000 Units by mouth daily.     No current facility-administered medications for this visit.     Allergies  Rofecoxib  Electrocardiogram:     02/02/18   afib rate 59 nonspecific ST changes   Assessment and Plan  MV Repair: SBE prophylaxis  No murmur on exam intact by echo 2015 will update TTE  PAF: On xarelto good rate  control Appears to have chronic afib   Thyroid:  Lab Results  Component Value Date   TSH 1.19 09/29/2017   Chol:    Lab Results  Component Value Date   LDLCALC 49 03/31/2017   Leg Pain: ? DJD ABI's were normal 12/05/15  no evidence of embolic event compliant with NOAC   Dysphagia:not interested in EGD cutting glucosamine tabs in half   Depression: controlled with Lexapro   Gait:  Seems neurologic and related to loss of visual cues with macular degeneration Needs Dig level To r/o toxicity She prefers to wait and get labs with her primary   F/U in a year with TTE   Jenkins Rouge

## 2018-02-04 ENCOUNTER — Ambulatory Visit: Payer: Medicare Other | Admitting: Cardiovascular Disease

## 2018-02-04 ENCOUNTER — Encounter: Payer: Self-pay | Admitting: Cardiovascular Disease

## 2018-02-04 VITALS — BP 112/66 | HR 65 | Ht 63.0 in | Wt 105.5 lb

## 2018-02-04 DIAGNOSIS — I10 Essential (primary) hypertension: Secondary | ICD-10-CM | POA: Diagnosis not present

## 2018-02-04 DIAGNOSIS — I4819 Other persistent atrial fibrillation: Secondary | ICD-10-CM

## 2018-02-04 NOTE — Patient Instructions (Signed)
Medication Instruction  If you need a refill on your cardiac medications before your next appointment, please call your pharmacy.   Lab work:  If you have labs (blood work) drawn today and your tests are completely normal, you will receive your results only by: Marland Kitchen MyChart Message (if you have MyChart) OR . A paper copy in the mail If you have any lab test that is abnormal or we need to change your treatment, we will call you to review the results.  Testing/Procedures: NONE ordered.  Follow-Up: At [facility], you and your health needs are our priority.  As part of our continuing mission to provide you with exceptional heart care, we have created designated Provider Care Teams.  These Care Teams include your primary Cardiologist (physician) and Advanced Practice Providers (APPs -  Physician Assistants and Nurse Practitioners) who all work together to provide you with the care you need, when you need it. You will need a follow up appointment in 1 years.  Please call our office 2 months in advance to schedule this appointment.  You may see Jenkins Rouge, MD or one of the following Advanced Practice Providers on your designated Care Team:   Truitt Merle, NP Cecilie Kicks, NP . Kathyrn Drown, NP

## 2018-02-18 ENCOUNTER — Encounter: Payer: Self-pay | Admitting: Family Medicine

## 2018-02-18 ENCOUNTER — Ambulatory Visit (INDEPENDENT_AMBULATORY_CARE_PROVIDER_SITE_OTHER): Payer: Medicare Other

## 2018-02-18 ENCOUNTER — Ambulatory Visit: Payer: Medicare Other | Admitting: Family Medicine

## 2018-02-18 VITALS — BP 104/68 | HR 63 | Temp 97.4°F | Ht 63.0 in | Wt 103.2 lb

## 2018-02-18 DIAGNOSIS — M79604 Pain in right leg: Secondary | ICD-10-CM

## 2018-02-18 DIAGNOSIS — M159 Polyosteoarthritis, unspecified: Secondary | ICD-10-CM

## 2018-02-18 DIAGNOSIS — M8949 Other hypertrophic osteoarthropathy, multiple sites: Secondary | ICD-10-CM

## 2018-02-18 DIAGNOSIS — M150 Primary generalized (osteo)arthritis: Secondary | ICD-10-CM

## 2018-02-18 DIAGNOSIS — M25551 Pain in right hip: Secondary | ICD-10-CM | POA: Diagnosis not present

## 2018-02-18 DIAGNOSIS — M1711 Unilateral primary osteoarthritis, right knee: Secondary | ICD-10-CM | POA: Diagnosis not present

## 2018-02-18 NOTE — Progress Notes (Signed)
Patient: Theresa [name] MRN: [id number] DOB: 01/26/1932 PCP: Marin Olp, MD     Subjective:  Chief Complaint  Patient presents with  . Arthritis  . right lower leg pain    HPI: The patient is a 84 y.o. female who presents today for arthritis pain. She is currently on etodolac daily. Also on xarelto as she has had a CVA in the past. She has concerns for her worsening arthritis and it's really hurting her more. She also has some balance issues that started about 2-3 years ago and she has seen Dr. Yong Channel for this. She isn't sure if the arthritis could be contributing to her balance. Her arthritis affects her hands, elbows, knees and hips. Right hip is worse than the left. She has never had any joints replaced. She also has voltaren gel, but does not use this regularly. Doesn't find this to be very helpful. She did have injections in her right hip by [place] [facility] and this didn't help at all. She is also seen at a chronic condition clinic with Dr. Salomon Fick.   Right lower leg weakness: it feels tight on the outside of her right leg. She has had this for more than a year (maybe 2-3 years). She feels like this is getting worse. No musle loss. She walks with a big limp because it hurts to walk on her right leg. She has known back issues with a fusion in the past. She has intact sensation, no foot drop. Has occasional burning on this side of her leg.   She has done PT and it didn't help her much.   Review of Systems  Constitutional: Negative for chills, fatigue and fever.  Respiratory: Negative for cough and shortness of breath.   Cardiovascular: Negative for chest pain.  Gastrointestinal: Negative for abdominal pain.  Musculoskeletal: Positive for arthralgias and gait problem. Negative for myalgias.       B/l elbow pain (L > R)  Right lower leg pain  B/l hip pain (R > L)  Neurological: Positive for speech difficulty (at baseline). Negative for tremors.  Psychiatric/Behavioral:  Negative for confusion.    Allergies Patient is allergic to rofecoxib.  Past Medical History Patient  has a past medical history of Atrial fibrillation ([place]), Collagenous colitis, CVA (cerebral vascular accident) ([place]), History of postmenopausal HRT, adenomatous colonic polyps, Hypothyroidism, MVP (mitral valve prolapse), Osteoarthritis, and Osteoporosis.  Surgical History Patient  has a past surgical history that includes Cholecystectomy; Lumbar fusion; Tonsillectomy; Dilation and curettage of uterus; Cataract extraction; Mitral valve repair (2008); and Cardioversion (01/09/2012).  Family History Pateint's family history includes Cancer in her father.  Social History Patient  reports that she quit smoking about 51 years ago. She has never used smokeless tobacco. She reports that she drinks about 1.0 standard drinks of alcohol per week. She reports that she does not use drugs.    Objective: Vitals:   02/18/18 1119  BP: 104/68  Pulse: 63  Temp: (!) 97.4 F (36.3 C)  TempSrc: Oral  SpO2: 100%  Weight: 103 lb 3.2 oz (46.8 kg)  Height: 5\' 3"  (1.6 m)    Body mass index is 18.28 kg/m.  Physical Exam  Constitutional: She is oriented to person, place, and time. She appears well-developed and well-nourished.  Musculoskeletal:  Bilateral hands with multiple OA changes with herbeden nodes and bouchard nodes. Hands slightly deformed and in flexed position. No swelling/erythema. She does have decreased hand grip bilaterally.   Lower legs: strength intact bilaterally 5/5. She  has great strength. DTR normal. Sensation intact.   Gait is abnormal. She walks with a limp due to pain in her right leg. Somewhat shuffling gait.   Neurological: She is alert and oriented to person, place, and time. She displays normal reflexes. No sensory deficit. She exhibits normal muscle tone.  Finger to nose cerebellar testing was essentially normal. She had problems with the left hand due to injury to her arm  from childhood injury.   Diadochokinesis intact.   Vitals reviewed.  Knee xray: no acute findings. official read pending  Hip xray: appears to have OA in right hip. Read pending.     Assessment/plan: 1. Right leg pain I personally think she has more going on in her back that is causing her right lower leg pain/sensation issues. Discussed may need MRI. Where she is hurting and where she is having issues likely is from possible nerve issue in her back. Steroid shot in hip did not work. Not interested in hip replacement, but will f/u on read. Discussed with her it would be worth further imaging fo her back with MRI, but unsure what kind of metal/material is in her fusion. May do well with back injections if she does have something going on.  She will see how she does and f/u with Dr. Yong Channel. Has already done PT.  - DG Knee 1-2 Views Right; Future - DG HIP UNILAT W OR W/O PELVIS 2-3 VIEWS RIGHT; Future  2. Primary osteoarthritis involving multiple joints Discussed with her that we have limited treatment options. Suggested she had tumeric on for some added anti inflammatory benefits. Already on NSAID which discussed with her she has increased risk of bleeding with her xarelto. voltraen does not work. Did tell her that if pain is unbearable sometimes I send to [facility] and they do steroids, but this is something that is more last resort. She would like to try tumeric first and will f/u with pcp for further treatment.     Return if symptoms worsen or fail to improve.   Orma Flaming, MD [place]   02/18/2018

## 2018-02-18 NOTE — Patient Instructions (Signed)
Start tumeric daily for arthritis.   F/u with dr. Yong Channel about possibly imaging your back. Could be contributing to your leg weakness vs. Hip arthritis.

## 2018-03-03 DIAGNOSIS — H35372 Puckering of macula, left eye: Secondary | ICD-10-CM | POA: Diagnosis not present

## 2018-03-03 DIAGNOSIS — H43813 Vitreous degeneration, bilateral: Secondary | ICD-10-CM | POA: Diagnosis not present

## 2018-03-03 DIAGNOSIS — H353132 Nonexudative age-related macular degeneration, bilateral, intermediate dry stage: Secondary | ICD-10-CM | POA: Diagnosis not present

## 2018-03-05 ENCOUNTER — Other Ambulatory Visit: Payer: Self-pay | Admitting: Family Medicine

## 2018-03-05 NOTE — Telephone Encounter (Signed)
See note

## 2018-03-05 NOTE — Telephone Encounter (Signed)
Requested medication (s) are due for refill today: yes  Requested medication (s) are on the active medication list: yes  Last refill:  03/05/2016  Future visit scheduled: yes in 1 month  Notes to clinic:  Sent back to office for approval- last prescription was in 2017   Requested Prescriptions  Pending Prescriptions Disp Refills   diclofenac sodium (VOLTAREN) 1 % GEL 100 g 3    Sig: Apply 2 g topically 3 (three) times daily as needed.     Analgesics:  Topicals Passed - 03/05/2018 12:25 PM      Passed - Valid encounter within last 12 months    Recent Outpatient Visits          2 weeks ago Right leg pain   [facility] Arlie Solomons, Ebony Hail, MD   2 months ago Longstanding persistent atrial fibrillation   [facility] [place] Regulus, Brayton Mars, MD   5 months ago Persistent atrial fibrillation [facility])   [facility] [place], Brayton Mars, MD   11 months ago Preventative health care   [place], Brayton Mars, MD   1 year ago Viral URI   [facility], Brayton Mars, MD      Future Appointments            In 1 month Hunter, Brayton Mars, MD [place], [place]   In 8 months  [place], [place]

## 2018-03-05 NOTE — Telephone Encounter (Signed)
Copied from [place] [id number]. Topic: Quick Communication - Rx Refill/Question >> Mar 05, 2018 11:22 AM Burchel, Abbi R wrote: Medication: diclofenac sodium (VOLTAREN) 1 % GEL  Preferred Pharmacy: [facility] - [address], [place] [address] Phone: [phone] Fax: [phone]   Pt was  advised that RX refills may take up to 3 business days. We ask that you follow-up with your pharmacy.

## 2018-03-08 MED ORDER — DICLOFENAC SODIUM 1 % TD GEL: 2.0000 g | Freq: Three times a day (TID) | TRANSDERMAL | 3 refills | Status: DC | PRN

## 2018-03-11 ENCOUNTER — Encounter: Payer: Self-pay | Admitting: Family Medicine

## 2018-03-12 ENCOUNTER — Telehealth: Payer: Self-pay

## 2018-03-12 NOTE — Telephone Encounter (Signed)
Prior Authorization submitted for Theresa [name] Key: [id number] - Rx #: [id number]  For diclofenac sodium (VOLTAREN) 1 % GEL  Pending at this time

## 2018-03-13 ENCOUNTER — Other Ambulatory Visit: Payer: Self-pay | Admitting: Family Medicine

## 2018-03-23 NOTE — Telephone Encounter (Signed)
Copied from [place] [id number]. Topic: Appointment Scheduling - Scheduling Inquiry for Clinic >> Mar 23, 2018  2:25 PM Berneta Levins wrote: Reason for CRM:   See MyChart message from 12/26. Pt wants to  be seen but soonest I can find is 01/21, pt wants to be seen before that.  Per [facility] send CRM. Pt can be reached at [phone]

## 2018-04-05 DIAGNOSIS — Z1231 Encounter for screening mammogram for malignant neoplasm of breast: Secondary | ICD-10-CM | POA: Diagnosis not present

## 2018-04-05 LAB — HM MAMMOGRAPHY

## 2018-04-08 ENCOUNTER — Encounter: Payer: Self-pay | Admitting: Family Medicine

## 2018-04-14 ENCOUNTER — Other Ambulatory Visit: Payer: Self-pay

## 2018-04-14 MED ORDER — FAMOTIDINE 20 MG PO TABS: 20.0000 mg | ORAL_TABLET | Freq: Two times a day (BID) | ORAL | 3 refills | Status: DC

## 2018-05-03 ENCOUNTER — Ambulatory Visit (INDEPENDENT_AMBULATORY_CARE_PROVIDER_SITE_OTHER): Payer: Medicare Other | Admitting: Family Medicine

## 2018-05-03 ENCOUNTER — Encounter: Payer: Self-pay | Admitting: Family Medicine

## 2018-05-03 VITALS — BP 108/68 | HR 73 | Temp 98.2°F | Ht 63.0 in | Wt 105.8 lb

## 2018-05-03 DIAGNOSIS — E785 Hyperlipidemia, unspecified: Secondary | ICD-10-CM

## 2018-05-03 DIAGNOSIS — K219 Gastro-esophageal reflux disease without esophagitis: Secondary | ICD-10-CM

## 2018-05-03 DIAGNOSIS — I4811 Longstanding persistent atrial fibrillation: Secondary | ICD-10-CM | POA: Diagnosis not present

## 2018-05-03 DIAGNOSIS — F3342 Major depressive disorder, recurrent, in full remission: Secondary | ICD-10-CM | POA: Diagnosis not present

## 2018-05-03 DIAGNOSIS — R2689 Other abnormalities of gait and mobility: Secondary | ICD-10-CM

## 2018-05-03 DIAGNOSIS — E034 Atrophy of thyroid (acquired): Secondary | ICD-10-CM

## 2018-05-03 DIAGNOSIS — I10 Essential (primary) hypertension: Secondary | ICD-10-CM

## 2018-05-03 LAB — COMPREHENSIVE METABOLIC PANEL
ALT: 17 U/L (ref 0–35)
AST: 19 U/L (ref 0–37)
Albumin: 4.2 g/dL (ref 3.5–5.2)
Alkaline Phosphatase: 81 U/L (ref 39–117)
BUN: 40 mg/dL — ABNORMAL HIGH (ref 6–23)
CO2: 31 mEq/L (ref 19–32)
CREATININE: 1 mg/dL (ref 0.40–1.20)
Calcium: 9.8 mg/dL (ref 8.4–10.5)
Chloride: 105 mEq/L (ref 96–112)
GFR: 52.71 mL/min — ABNORMAL LOW (ref >60.00)
GLUCOSE: 63 mg/dL — AB (ref 70–99)
Potassium: 5.4 mEq/L — ABNORMAL HIGH (ref 3.5–5.1)
SODIUM: 140 meq/L (ref 135–145)
Total Bilirubin: 0.6 mg/dL (ref 0.2–1.2)
Total Protein: 6.2 g/dL (ref 6.0–8.3)

## 2018-05-03 LAB — LIPID PANEL
Cholesterol: 134 mg/dL (ref 0–200)
HDL: 66.4 mg/dL (ref >39.00)
LDL Cholesterol: 53 mg/dL (ref 0–99)
NonHDL: 67.4
Total CHOL/HDL Ratio: 2
Triglycerides: 70 mg/dL (ref 0.0–149.0)
VLDL: 14 mg/dL (ref 0.0–40.0)

## 2018-05-03 LAB — CBC
HCT: 41.8 % (ref 36.0–46.0)
Hemoglobin: 14 g/dL (ref 12.0–15.0)
MCHC: 33.4 g/dL (ref 30.0–36.0)
MCV: 94.8 fl (ref 78.0–100.0)
Platelets: 194 10*3/uL (ref 150.0–400.0)
RBC: 4.41 Mil/uL (ref 3.87–5.11)
RDW: 13.4 % (ref 11.5–15.5)
WBC: 7 10*3/uL (ref 4.0–10.5)

## 2018-05-03 MED ORDER — METOPROLOL TARTRATE 75 MG PO TABS: 75.0000 mg | ORAL_TABLET | Freq: Two times a day (BID) | ORAL | 3 refills | Status: DC

## 2018-05-03 NOTE — Patient Instructions (Addendum)
Team please update phq9 score  See if you can find pepcid locally and im happy to send a prescription in if needed   Glad you are using a cane. A walker would be more more secure and if you have any more falls I would recommend transitioning to the walker. Can get from a medical supply store.   Hopefully decreasing metoprolol to 75mg   Twice a daywill make you less dizzy with standing  Please stop by lab before you go If you do not have mychart- we will call you about results within 5 business days of [place] receiving them.  If you have mychart- we will send your results within 3 business days of [place] receiving them.  If abnormal or we want to clarify a result, we will call or mychart you to make sure you receive the message.  If you have questions or concerns or don't hear within 5-7 days, please send [place] a message or call us.

## 2018-05-03 NOTE — Assessment & Plan Note (Signed)
S: Patient states that she is having balance issues. Last fall she had was in December 2019. She had dizziness with this. Acute short term dizziness but has not recurred- had just stood up though. .  She is having some right leg pain- see discussion from last visit with Dr. Rogers Blocker. Seeing a chiropractor has helped some- lifting the leg up/elevating has helped pain- standing too long it really bothers her.   Balance issue risk factors-macular degeneration and follows up with ophthalmology.  History of stroke.  Leg pain certainly contributes-seeing Dr. Nelva Bush.  Has done gait and balance training in the past-does not want to follow-up.  Resistance to using a cane previously  She is now using a cane today and has found that somewhat helpful. Did not have at the time of bathroom fall.  A/P: poor control. Encouraged cane use. Also discussed walker would make her more steady- she is not ready for that step. We will reduce metoprolol to see if we can reduce any potential contributing orthostatic symptoms.

## 2018-05-03 NOTE — Assessment & Plan Note (Signed)
lexapro has been helpful in past. PHQ9 today up to 9. Discussed adding counseling- she declines for now but is willing to rediscuss at follow up- would be good to repeat phq9 at follow up- her hands/pain/limited mobility contribute. Wants to continue to live and home and husband does too- she is concerned may not be able to do that which discourages her

## 2018-05-03 NOTE — Progress Notes (Signed)
Phone [phone]   Subjective:  Theresa [name] is a 85 y.o. year old very pleasant female patient who presents for/with See problem oriented charting ROS-reports imbalance.  No chest pain or shortness of breath reported.  Dizziness at times with standing.  Trouble swallowing glucosamine tablets still-worse since has not been able to find Pepcid   Past Medical History-  Patient Active Problem List   Diagnosis Date Noted  . TIA (transient ischemic attack) 03/21/2013    Priority: High  . Hx of ischemic left MCA stroke 04/08/2011    Priority: High  . Atrial fibrillation ([place]) 07/07/2007    Priority: High  . History of mitral valve repair 09/11/2006    Priority: High  . Hyperlipidemia 09/01/2014    Priority: Medium  . CKD (chronic kidney disease), stage III ([place]) 03/02/2014    Priority: Medium  . Major depression in full remission ([place]) 11/23/2013    Priority: Medium  . Hypertension 03/04/2012    Priority: Medium  . GASTROESOPHAGEAL REFLUX DISEASE, MILD 06/04/2009    Priority: Medium  . Hypothyroidism 11/23/2006    Priority: Medium  . Osteopenia 11/23/2006    Priority: Medium  . Osteoarthritis 09/11/2006    Priority: Medium  . Rosacea 11/23/2013    Priority: Low  . 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 PERSONAL HX COLONIC POLYPS 10/22/2009    Priority: Low  . CHANGE IN BOWELS 09/20/2009    Priority: Low  . Raynaud's syndrome 03/06/2009    Priority: Low  . SCOLIOSIS, LUMBAR SPINE 01/03/2009    Priority: Low  . Balance disorder 05/03/2018  . Macular degeneration 03/31/2017  . Difficulty swallowing pills 11/26/2016    Medications- reviewed and updated Current Outpatient Medications  Medication Sig Dispense Refill  . atorvastatin (LIPITOR) 20 MG tablet TAKE 1 TABLET BY MOUTH EVERY EVENING 90 tablet 1  . Calcium Carbonate-Vitamin D (CALTRATE 600+D) 600-400 MG-UNIT per tablet Take 1 tablet by mouth 2 (two) times daily.       . Cholecalciferol (VITAMIN D) 2000 UNITS CAPS Take 2,000 Units by mouth daily.     . digoxin (LANOXIN) 0.125 MG tablet Take 0.5 tablets (62.5 mcg total) by mouth daily. 30 tablet 5  . escitalopram (LEXAPRO) 10 MG tablet TAKE 1 TABLET BY MOUTH DAILY GENERIC EQUIVALENT FOR LEXAPRO 90 tablet 2  . etodolac (LODINE) 400 MG tablet TAKE 1 TABLET BY MOUTH DAILY 90 tablet 1  . glucosamine-chondroitin 500-400 MG tablet Take 1 tablet by mouth 2 (two) times daily.      Marland Kitchen levothyroxine (SYNTHROID, LEVOTHROID) 75 MCG tablet TAKE ONE TABLET BY MOUTH ONCE DAILY BEFORE  BREAKFAST 90 tablet 3  . metoprolol tartrate 75 MG TABS Take 75 mg by mouth 2 (two) times daily. 180 tablet 3  . Multiple Vitamins-Minerals (PRESERVISION AREDS 2 PO) Take 1 tablet by mouth 2 (two) times daily. Reported on 09/04/2015    . rivaroxaban (XARELTO) 20 MG TABS tablet Take 1 tablet (20 mg total) by mouth daily with supper. 90 tablet 3  . vitamin A 10000 UNIT capsule Take 10,000 Units by mouth daily.    . famotidine (PEPCID) 20 MG tablet Take 1 tablet (20 mg total) by mouth 2 (two) times daily. (Patient not taking: Reported on 05/03/2018) 180 tablet 3   No current facility-administered medications for this visit.      Objective:  BP 108/68   Pulse 73   Temp 98.2 F (36.8 C) (Oral)   Ht  5\' 3"  (1.6 m)   Wt 105 lb 12.8 oz (48 kg)   LMP  (LMP Unknown)   SpO2 98%   BMI 18.74 kg/m  Gen: NAD, resting comfortably, thin CV: RRR no murmurs rubs or gallops Lungs: CTAB no crackles, wheeze, rhonchi Ext: no edema Skin: warm, dry Neuro:  Walks with cane today- slow  Non shuffling gait    Assessment and Plan    #Hypothyroidism S: Compliant with levothyroxine 75 mcg A/P: likely stable- update TSH    #CKD stage III #osteoarthritis- hands/elbows. voltaren fail. Minimal help cbd oil.  S: Compliant with blood pressure medications.  Sparing etodolac in the past-have discouraged this with CKD and Xarelto use. She is using this once a  day- states significant pain and wants to continue. voltaren not helpfu. A/P: hopeful stable . Update labs today- she is going to trial without the etodolac and recommended tylenol arthritis- she has low hopes this will be helpful.    #Hyperlipidemia S: Compliant with atorvastatin 20 mg A/P: Stable. Continue current medications. Last LDL under 70 with stroke history.   Balance disorder S: Patient states that she is having balance issues. Last fall she had was in December 2019. She had dizziness with this. Acute short term dizziness but has not recurred- had just stood up though. .  She is having some right leg pain- see discussion from last visit with Dr. Rogers Blocker. Seeing a chiropractor has helped some- lifting the leg up/elevating has helped pain- standing too long it really bothers her.   Balance issue risk factors-macular degeneration and follows up with ophthalmology.  History of stroke.  Leg pain certainly contributes-seeing Dr. Nelva Bush.  Has done gait and balance training in the past-does not want to follow-up.  Resistance to using a cane previously  She is now using a cane today and has found that somewhat helpful. Did not have at the time of bathroom fall.  A/P: poor control. Encouraged cane use. Also discussed walker would make her more steady- she is not ready for that step. We will reduce metoprolol to see if we can reduce any potential contributing orthostatic symptoms.    Atrial fibrillation S: On Xarelto for anticoagulation.  On digoxin.  On metoprolol 100 mg twice a day for rate control. A/P: Stable. Continue current medications but reduce metoprolol to 75 mg BID   Hypertension S: Controlled on metoprolol alone. A/P: reduce dose to 75mg  BID to see if helps with othostatic issues   GASTROESOPHAGEAL REFLUX DISEASE, MILD Trouble swallowing glucosamine pills even when cuts in half. This was better on pepcid but she has had trouble locating this- she plans to try to research with local  pharmacies.   Major depression in full remission ([place]) lexapro has been helpful in past. PHQ9 today up to 9. Discussed adding counseling- she declines for now but is willing to rediscuss at follow up- would be good to repeat phq9 at follow up- her hands/pain/limited mobility contribute. Wants to continue to live and home and husband does too- she is concerned may not be able to do that which discourages her   Future Appointments  Date Time Provider [place]  11/01/2018 10:40 AM Marin Olp, MD [facility]   Return in about 6 months (around 11/01/2018) for physical or sooner if you need [place].  Lab/Order associations: fruit for breakfast and piece of bread Hypothyroidism due to acquired atrophy of thyroid - Plan: TSH  Hyperlipidemia, unspecified hyperlipidemia type - Plan: CBC, Comprehensive metabolic panel, Lipid  panel  Recurrent major depressive disorder, in full remission ([place]), Chronic  Longstanding persistent atrial fibrillation  Balance disorder  Essential hypertension  Gastroesophageal reflux disease without esophagitis  Meds ordered this encounter  Medications  . metoprolol tartrate 75 MG TABS    Sig: Take 75 mg by mouth 2 (two) times daily.    Dispense:  180 tablet    Refill:  3    Return precautions advised.  Garret Reddish, MD

## 2018-05-03 NOTE — Assessment & Plan Note (Signed)
S: Controlled on metoprolol alone. A/P: reduce dose to 75mg  BID to see if helps with othostatic issues

## 2018-05-03 NOTE — Assessment & Plan Note (Signed)
S: On Xarelto for anticoagulation.  On digoxin.  On metoprolol 100 mg twice a day for rate control. A/P: Stable. Continue current medications but reduce metoprolol to 75 mg BID

## 2018-05-03 NOTE — Assessment & Plan Note (Signed)
Trouble swallowing glucosamine pills even when cuts in half. This was better on pepcid but she has had trouble locating this- she plans to try to research with local pharmacies.

## 2018-05-04 ENCOUNTER — Other Ambulatory Visit: Payer: Self-pay | Admitting: Family Medicine

## 2018-05-04 LAB — TSH: TSH: 1.58 u[IU]/mL (ref 0.35–4.50)

## 2018-05-04 MED ORDER — ETODOLAC 400 MG PO TABS: 400.0000 mg | ORAL_TABLET | Freq: Every day | ORAL | 1 refills | Status: DC

## 2018-05-05 ENCOUNTER — Telehealth: Payer: Self-pay | Admitting: Family Medicine

## 2018-05-05 NOTE — Telephone Encounter (Signed)
Pt called to get lab results.  Please call pt: [phone]  Copied from [place] [phone]. Topic: Quick Communication - Lab Results (Clinic Use ONLY) >> May 05, 2018  2:17 PM Franco Collet, CMA wrote: Called patient to inform them of  lab results. When patient returns call, triage nurse may disclose results.

## 2018-05-06 ENCOUNTER — Other Ambulatory Visit (INDEPENDENT_AMBULATORY_CARE_PROVIDER_SITE_OTHER): Payer: Medicare Other

## 2018-05-06 ENCOUNTER — Telehealth: Payer: Self-pay | Admitting: Radiology

## 2018-05-06 DIAGNOSIS — E876 Hypokalemia: Secondary | ICD-10-CM | POA: Diagnosis not present

## 2018-05-06 LAB — BASIC METABOLIC PANEL
BUN: 41 mg/dL — ABNORMAL HIGH (ref 6–23)
CO2: 31 mEq/L (ref 19–32)
Calcium: 9.8 mg/dL (ref 8.4–10.5)
Chloride: 103 mEq/L (ref 96–112)
Creatinine, Ser: 1.09 mg/dL (ref 0.40–1.20)
GFR: 47.72 mL/min — ABNORMAL LOW (ref >60.00)
GLUCOSE: 87 mg/dL (ref 70–99)
Potassium: 4.9 mEq/L (ref 3.5–5.1)
Sodium: 139 mEq/L (ref 135–145)

## 2018-05-06 NOTE — Telephone Encounter (Signed)
Please place future lab orders for Pts lab appt at 1000 hrs today. Thanks!

## 2018-05-06 NOTE — Telephone Encounter (Signed)
Pt is requesting a handicap sticker for her husbands vehicle. Pt does not drive herself. 1 sticker requested. Spoke with Dr. Yong Channel, he stated his team would follow up on this request.

## 2018-05-10 NOTE — Telephone Encounter (Signed)
This is being taking care of. Trying to figure how we can help the couple.

## 2018-05-11 ENCOUNTER — Encounter: Payer: Self-pay | Admitting: Family Medicine

## 2018-05-11 DIAGNOSIS — R2689 Other abnormalities of gait and mobility: Secondary | ICD-10-CM

## 2018-05-11 NOTE — Telephone Encounter (Signed)
Please advise 

## 2018-05-12 NOTE — Telephone Encounter (Signed)
I've tried to call the [facility] for information to see if the placard can be filled out for a spouse who does not drive. However, I am not able to get in contact with anyone due to the automated phone routing. I spoke to pt and advised her to speak with someone at the [facility] regarding the issue and call the office back. Pt was advised that I will try again to get an answer. Pt verbalized understanding.

## 2018-05-14 NOTE — Telephone Encounter (Signed)
See note

## 2018-05-14 NOTE — Telephone Encounter (Signed)
Pt called and stated she would like to speak with Tillie Rung regarding handicap placard. She has some more information. Please advise.

## 2018-05-17 NOTE — Telephone Encounter (Signed)
Spoke to pt and she gave me updated information regarding form information. Pt was advised to bring the form to the office to have it filled out properly. Pt mychart was also addressed. No further action needed!

## 2018-05-18 ENCOUNTER — Other Ambulatory Visit: Payer: Self-pay | Admitting: Family Medicine

## 2018-05-19 NOTE — Telephone Encounter (Signed)
Pt mychart questions were addressed on this Phone call as well.

## 2018-05-19 NOTE — Telephone Encounter (Signed)
Pt called stating no further information needed for the handicap placard. They have taken care of it.

## 2018-05-20 NOTE — Telephone Encounter (Signed)
Noted  

## 2018-05-24 ENCOUNTER — Encounter: Payer: Self-pay | Admitting: Neurology

## 2018-05-24 ENCOUNTER — Ambulatory Visit: Payer: Medicare Other | Admitting: Neurology

## 2018-05-24 VITALS — BP 161/75 | HR 77 | Ht 62.0 in | Wt 107.0 lb

## 2018-05-24 DIAGNOSIS — G3281 Cerebellar ataxia in diseases classified elsewhere: Secondary | ICD-10-CM | POA: Diagnosis not present

## 2018-05-24 DIAGNOSIS — I69322 Dysarthria following cerebral infarction: Secondary | ICD-10-CM

## 2018-05-24 DIAGNOSIS — M4125 Other idiopathic scoliosis, thoracolumbar region: Secondary | ICD-10-CM

## 2018-05-24 DIAGNOSIS — R2689 Other abnormalities of gait and mobility: Secondary | ICD-10-CM | POA: Diagnosis not present

## 2018-05-24 DIAGNOSIS — I639 Cerebral infarction, unspecified: Secondary | ICD-10-CM

## 2018-05-24 NOTE — Patient Instructions (Addendum)
I think your imbalance is due to multiple factors:  Ageing, prior cerebellar strokes, scoliosis, suboptimal water intake, deconditioning and arthritis. You have had Physical therapy before.  Please use your walker.  Consider physical therapy again.  We will do a brain scan, called MRI and call you with the test results. We will have to schedule you for this on a separate date. This test requires authorization from your insurance, and we will take care of the insurance process. Most likely, I can see you back as needed.  Please think about reducing your wine even to less than 6 oz. Try to drink about 6 glasses of water per day.

## 2018-05-24 NOTE — Progress Notes (Signed)
Subjective:    Patient ID: Theresa [name] is a 85 y.o. female.  HPI     Star Age, MD, PhD [facility] [address], [address] [place], [place] [address]  Dear Dr. Yong Channel,  I saw your patient, Theresa [name], upon your kind request in my neurologic clinic today for initial consultation of her balance disorder. The patient is unaccompanied today. As you know, Theresa [name] is an 85 year old right-handed woman with an underlying medical history of A. fib, stroke, history of colitis, colonic polyps, hyperlipidemia, depression, chronic kidney disease, hypothyroidism, MVP, osteoporosis, and osteoarthritis, who reports difficulty with her balance for the past few years. I reviewed your office note from 05/03/2018. She was previously followed by neurology at [facility], by Drs. Jacelyn Grip, then [place], for her history of stroke and memory loss. Of note, she had a brain MRI without contrast and MRA head without contrast on 03/21/2013 which I reviewed: IMPRESSION: No acute infarction. Generalized atrophy. Old small vessel cerebellar infarctions. Old left parietal cortical and subcortical infarction.   No major vessel occlusion or correctable proximal stenosis. She does report a stroke many years ago affecting her right side. She has had worsening difficulty with her speech. She has not fallen recently.she had physical therapy a couple years ago and was offered physical therapy again. She found PT not very effective. She has been using a cane for the past several months off and on. She also bought a 2 wheeled walker maybe a month ago. She does not smoke, she admits that she does not always drink a lot of water. She drinks alcohol in the form of wine, 6 ounces per day on average. She reports that she has scoliosis.  Her Past Medical History Is Significant For: Past Medical History:  Diagnosis Date  . Atrial fibrillation (H[facility])    AFIB  . Collagenous colitis   . CVA  (cerebral vascular accident) ([place])    a.  L parietal CVA by MRI 78/67, likely embolic;  b.  TEE by report with neg bubble study;  c.  carotid dopplers  12/12:  + plaque, no sig ICA stenosis   . History of postmenopausal HRT   . Hx of adenomatous colonic polyps   . Hypothyroidism   . MVP (mitral valve prolapse)    a. s/p MV repair at [facility] in 2008;  b. Echocardiogram 7/12: EF 50%, status post mitral valve repair with mild MR and minimal MS, mean gradient 4, moderate LAE, LA diam 55 mm; mild RAE, PASP 28-32;   Marland Kitchen Osteoarthritis   . Osteoporosis     Her Past Surgical History Is Significant For: Past Surgical History:  Procedure Laterality Date  . CARDIOVERSION  01/09/2012   Procedure: CARDIOVERSION;  Surgeon: Jolaine Artist, MD;  Location: [facility] ENDOSCOPY;  Service: Cardiovascular;  Laterality: N/A;  . CATARACT EXTRACTION    . CHOLECYSTECTOMY    . DILATION AND CURETTAGE OF UTERUS    . LUMBAR FUSION    . MITRAL VALVE REPAIR  2008  . TONSILLECTOMY      Her Family History Is Significant For: Family History  Problem Relation Age of Onset  . Cancer Father        COLON  . Stroke Neg Hx        1ST DEGREE RELATIVE <60    Her Social History Is Significant For: Social History   Socioeconomic History  . Marital status: Married    Spouse name: Not on file  . Number  of children: Not on file  . Years of education: Not on file  . Highest education level: Master's degree (e.g., MA, MS, MEng, MEd, MSW, MBA)  Occupational History  . Occupation: RETIRED    Employer: RETIRED    Comment: Engineer, mining  Social Needs  . Financial resource strain: Not on file  . Food insecurity:    Worry: Not on file    Inability: Not on file  . Transportation needs:    Medical: Not on file    Non-medical: Not on file  Tobacco Use  . Smoking status: Former Smoker    Last attempt to quit: 04/28/1966    Years since quitting: 52.1  . Smokeless tobacco: Never Used  Substance and Sexual Activity  . Alcohol  use: Yes    Alcohol/week: 1.0 standard drinks    Types: 1 Glasses of wine per week    Comment: 1 small glass with luch and dinner  . Drug use: No  . Sexual activity: Not Currently  Lifestyle  . Physical activity:    Days per week: Not on file    Minutes per session: Not on file  . Stress: Not on file  Relationships  . Social connections:    Talks on phone: Not on file    Gets together: Not on file    Attends religious service: Not on file    Active member of club or organization: Not on file    Attends meetings of clubs or organizations: Not on file    Relationship status: Not on file  Other Topics Concern  . Not on file  Social History Narrative   Married for 58 years-1st marriage for both. No kids. No pets.       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 Rofecoxib     REACTION: Elevated LFT's  :   Her Current Medications Are:  Outpatient Encounter Medications as of 05/24/2018  Medication Sig  . atorvastatin (LIPITOR) 20 MG tablet TAKE 1 TABLET BY MOUTH EVERY EVENING  . Calcium Carbonate-Vitamin D (CALTRATE 600+D) 600-400 MG-UNIT per tablet Take 1 tablet by mouth 2 (two) times daily.    . Cholecalciferol (VITAMIN D) 2000 UNITS CAPS Take 2,000 Units by mouth daily.   . digoxin (LANOXIN) 0.125 MG tablet Take 0.5 tablets (62.5 mcg total) by mouth daily.  Marland Kitchen escitalopram (LEXAPRO) 10 MG tablet TAKE 1 TABLET BY MOUTH DAILY GENERIC EQUIVALENT FOR LEXAPRO  . etodolac (LODINE) 400 MG tablet Take 1 tablet (400 mg total) by mouth daily.  . famotidine (PEPCID) 20 MG tablet Take 1 tablet (20 mg total) by mouth 2 (two) times daily.  Marland Kitchen glucosamine-chondroitin 500-400 MG tablet Take 1 tablet by mouth 2 (two) times daily.    Marland Kitchen levothyroxine (SYNTHROID, LEVOTHROID) 75 MCG  tablet TAKE ONE TABLET BY MOUTH ONCE DAILY BEFORE BREAKFAST  . metoprolol tartrate 75 MG TABS Take 75 mg by mouth 2 (two) times daily.  . Multiple Vitamins-Minerals (PRESERVISION AREDS 2 PO) Take 1 tablet by mouth 2 (two) times daily. Reported on 09/04/2015  . rivaroxaban (XARELTO) 20 MG TABS tablet Take 1 tablet (20 mg total) by mouth daily with supper.  . vitamin A 10000 UNIT capsule Take 10,000 Units by  mouth daily.   No facility-administered encounter medications on file as of 05/24/2018.   :   Review of Systems:  Out of a complete 14 point review of systems, all are reviewed and negative with the exception of these symptoms as listed below:  Review of Systems  Neurological:       Pt presents today to discuss her imbalance. Pt reports that her balance problems have been present for a few years now.    Objective:  Neurological Exam  Physical Exam Physical Examination:   Vitals:   05/24/18 1140  BP: (!) 161/75  Pulse: 77    General Examination: The patient is a very pleasant 85 y.o. female in no acute distress. She appears thin and somewhat frail. Well groomed.  HEENT: Normocephalic, atraumatic, pupils are equal, round and reactive to light and accommodation.Hearing is grossly intact, she has no facial asymmetry, minimal dysarthria is noted. Extraocular tracking is preserved. Airway examination reveals mild to moderate mouth dryness, no tongue deviation, and palate elevates symmetrically. She has no carotid bruit.  Chest: Clear to auscultation without wheezing, rhonchi or crackles noted.  Heart: S1+S2+0, Irregularly irregular. No murmurs.   Abdomen: Soft, non-tender and non-distended with normal bowel sounds appreciated on auscultation.  Extremities: There is no pitting edema in the distal lower extremities bilaterally. Pedal pulses are intact.  Skin: Warm and dry without trophic changes noted.  Musculoskeletal: exam reveals prominent arthritic changes in both hands.    Neurologically:  Mental status: The patient is awake, alert and oriented in all 4 spheres. Her immediate and remote memory, attention, language skills and fund of knowledge are appropriate. There is no evidence of aphasia, agnosia, apraxia or anomia. Speech is clear with normal prosody and enunciation. Thought process is linear. Mood is normal and affect is normal.  Cranial nerves II - XII are as described above under HEENT exam. In addition: shoulder shrug is normal with equal shoulder height noted. Motor exam: thin bulk, global strength of 4+/5, normal tone is noted. There is no drift, tremor or rebound. Romberg is not tested secondary to safety concerns. Reflexes are 1+. Babinski: Toes are down. Fine motor skills and coordination: grossly intact for age. Cerebellar testing: No dysmetria or intention tremor on finger to nose testing. Heel to shin is unremarkable bilaterally. There is no truncal or gait ataxia.  Sensory exam: intact to light touch in the upper and lower extremities.  Gait, station and balance: She stands easily. No veering to one side is noted. Posture is mildly stooped and she does have some scoliosis. No leaning to one side is noted. Stance is slightly wider base. She walks somewhat insecurely slightly wider based. Preserved arm swing noted. She turns insecurely. She has a 2 wheeled walker which she maneuvers well.  Assessment and Plan:   In summary, Theresa [name] is a very pleasant 85 y.o.-year old female with an underlying medical history of A. fib, stroke, history of colitis, colonic polyps, hyperlipidemia, depression, chronic kidney disease, hypothyroidism, MVP, osteoporosis, and osteoarthritis, who presents for evaluation of her instability and gait disorder. On examination, she does have a nonspecific gait disturbance but she also has some cerebellar signs including slight dysarthria and wider based gait. I think overall her presentation is more multifactorial in nature,  what with her prior history of strokes, including cerebellar strokes, history of arthritis, scoliosis, possibly suboptimal hydration with water, deconditioning. I talked to the patient at length today. She has been through physical therapy before, this could be  considered again. She is encouraged to talk to you about it again. I would like to suggest a brain MRI to rule out any new structural cause or stroke as a contributor to her balance issue, we could compare findings from 2015. I did ask her to use her walker and change positions slowly, stay better hydrated with water and consider even reducing her small amount of alcohol she consumes every day. So long as the MRI is not concerning for any new or acute findings we will keep her posted by phone call and I can see her back on an as-needed basis. I answered all her questions today and she was in agreement with the plan. Thank you very much for allowing me to participate in the care of this nice patient. If I can be of any further assistance to you please do not hesitate to call me at [phone].  Sincerely,   Star Age, MD, PhD

## 2018-05-25 ENCOUNTER — Telehealth: Payer: Self-pay | Admitting: Neurology

## 2018-05-25 NOTE — Telephone Encounter (Signed)
[employer] Medicare Josem Kaufmann: [id number] (exp. 05/25/18 to 06/23/18) order sent to GI. They will reach out to the pt to schedule.

## 2018-06-07 ENCOUNTER — Other Ambulatory Visit: Payer: Self-pay | Admitting: Family Medicine

## 2018-06-08 NOTE — Telephone Encounter (Signed)
Last OV 05/03/2018 Last refill 06/08/2017 #90/3 Next OV 11/01/2018

## 2018-07-27 ENCOUNTER — Other Ambulatory Visit: Payer: Self-pay

## 2018-07-27 MED ORDER — DIGOXIN 125 MCG PO TABS: 62.5000 ug | ORAL_TABLET | Freq: Every day | ORAL | 1 refills | Status: DC

## 2018-07-27 MED ORDER — ATORVASTATIN CALCIUM 20 MG PO TABS: 20.0000 mg | ORAL_TABLET | Freq: Every evening | ORAL | 1 refills | Status: DC

## 2018-08-30 DIAGNOSIS — H35372 Puckering of macula, left eye: Secondary | ICD-10-CM | POA: Diagnosis not present

## 2018-08-30 DIAGNOSIS — H35453 Secondary pigmentary degeneration, bilateral: Secondary | ICD-10-CM | POA: Diagnosis not present

## 2018-08-30 DIAGNOSIS — H35352 Cystoid macular degeneration, left eye: Secondary | ICD-10-CM | POA: Diagnosis not present

## 2018-08-30 DIAGNOSIS — H35363 Drusen (degenerative) of macula, bilateral: Secondary | ICD-10-CM | POA: Diagnosis not present

## 2018-09-09 ENCOUNTER — Other Ambulatory Visit: Payer: Self-pay | Admitting: Family Medicine

## 2018-09-18 ENCOUNTER — Other Ambulatory Visit: Payer: Self-pay | Admitting: Family Medicine

## 2018-09-20 ENCOUNTER — Other Ambulatory Visit: Payer: Self-pay | Admitting: Family Medicine

## 2018-09-26 ENCOUNTER — Other Ambulatory Visit: Payer: Self-pay | Admitting: Family Medicine

## 2018-10-05 ENCOUNTER — Other Ambulatory Visit: Payer: Self-pay | Admitting: Family Medicine

## 2018-10-29 NOTE — Patient Instructions (Addendum)
Health Maintenance Due  Topic Date Due  . INFLUENZA VACCINE  10/16/2018  We should have flu shots available by September. Please strongly consider getting flu shot this year. If you get your flu shot at a pharmacy- please let us know.   No changes today other than you have to promise me to not lose weight and to not have any blood in your stool!    Please stop by lab before you go If you do not have mychart- we will call you about results within 5 business days of [place] receiving them.  If you have mychart- we will send your results within 3 business days of [place] receiving them.  If abnormal or we want to clarify a result, we will call or mychart you to make sure you receive the message.  If you have questions or concerns or don't hear within 5-7 days, please send [place] a message or call us.

## 2018-10-29 NOTE — Progress Notes (Signed)
Phone: [phone]   Subjective:  Patient presents today for their annual physical. Chief complaint-noted.   See problem oriented charting- ROS- full  review of systems was completed and negative except for: tinnitus, trouble swallowing with large pills- not worsening, visual pproblems with macular degeneration, palpitations, joint pain   The following were reviewed and entered/updated in epic: Past Medical History:  Diagnosis Date  . Atrial fibrillation (HCC)    AFIB  . Collagenous colitis   . CVA (cerebral vascular accident) ([place])    a.  L parietal CVA by MRI 21/30, likely embolic;  b.  TEE by report with neg bubble study;  c.  carotid dopplers  12/12:  + plaque, no sig ICA stenosis   . History of postmenopausal HRT   . Hx of adenomatous colonic polyps   . Hypothyroidism   . MVP (mitral valve prolapse)    a. s/p MV repair at [facility] in 2008;  b. Echocardiogram 7/12: EF 50%, status post mitral valve repair with mild MR and minimal MS, mean gradient 4, moderate LAE, LA diam 55 mm; mild RAE, PASP 28-32;   Marland Kitchen Osteoarthritis   . Osteoporosis    Patient Active Problem List   Diagnosis Date Noted  . TIA (transient ischemic attack) 03/21/2013    Priority: High  . Hx of ischemic left MCA stroke 04/08/2011    Priority: High  . Atrial fibrillation ([place]) 07/07/2007    Priority: High  . History of mitral valve repair 09/11/2006    Priority: High  . Hyperlipidemia 09/01/2014    Priority: Medium  . CKD (chronic kidney disease), stage III ([place]) 03/02/2014    Priority: Medium  . Major depression in full remission ([place]) 11/23/2013    Priority: Medium  . Hypertension 03/04/2012    Priority: Medium  . GASTROESOPHAGEAL REFLUX DISEASE, MILD 06/04/2009    Priority: Medium  . Hypothyroidism 11/23/2006    Priority: Medium  . Osteopenia 11/23/2006    Priority: Medium  . Osteoarthritis 09/11/2006    Priority: Medium  . Rosacea 11/23/2013    Priority: Low  . 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 PERSONAL HX COLONIC POLYPS 10/22/2009    Priority: Low  . CHANGE IN BOWELS 09/20/2009    Priority: Low  . Raynaud's syndrome 03/06/2009    Priority: Low  . SCOLIOSIS, LUMBAR SPINE 01/03/2009    Priority: Low  . Balance disorder 05/03/2018  . Macular degeneration 03/31/2017  . Difficulty swallowing pills 11/26/2016   Past Surgical History:  Procedure Laterality Date  . CARDIOVERSION  01/09/2012   Procedure: CARDIOVERSION;  Surgeon: Jolaine Artist, MD;  Location[facility] ENDOSCOPY;  Service: Cardiovascular;  Laterality: N/A;  . CATARACT EXTRACTION    . CHOLECYSTECTOMY    . DILATION AND CURETTAGE OF UTERUS    . LUMBAR FUSION    . MITRAL VALVE REPAIR  2008  . TONSILLECTOMY      Family History  Problem Relation Age of Onset  . Cancer Father        COLON  . Stroke Neg Hx        1ST DEGREE RELATIVE <60    Medications- reviewed and updated Current Outpatient Medications  Medication Sig Dispense Refill  . atorvastatin (LIPITOR) 20 MG tablet Take 1 tablet (20 mg total) by mouth every evening. 90 tablet 1  . Calcium Carbonate-Vitamin D (CALTRATE 600+D) 600-400 MG-UNIT per tablet Take 1 tablet by mouth 2 (two) times daily.      Marland Kitchen  Cholecalciferol (VITAMIN D) 2000 UNITS CAPS Take 2,000 Units by mouth daily.     . digoxin (LANOXIN) 0.125 MG tablet Take 0.5 tablets (62.5 mcg total) by mouth daily. 90 tablet 1  . escitalopram (LEXAPRO) 10 MG tablet TAKE 1 TABLET BY MOUTH DAILY 90 tablet 2  . etodolac (LODINE) 400 MG tablet Take 1 tablet (400 mg total) by mouth daily. 90 tablet 1  . famotidine (PEPCID) 20 MG tablet Take 1 tablet (20 mg total) by mouth 2 (two) times daily. 180 tablet 3  . glucosamine-chondroitin 500-400 MG tablet Take 1 tablet by mouth 2 (two) times daily.      Marland Kitchen levothyroxine (SYNTHROID, LEVOTHROID) 75 MCG tablet TAKE ONE TABLET BY MOUTH ONCE DAILY BEFORE BREAKFAST 90 tablet 3  . metoprolol tartrate (LOPRESSOR) 100 MG  tablet TAKE 1 TABLET BY MOUTH TWO TIMES DAILY 180 tablet 2  . Multiple Vitamins-Minerals (PRESERVISION AREDS 2 PO) Take 1 tablet by mouth 2 (two) times daily. Reported on 09/04/2015    . vitamin A 10000 UNIT capsule Take 10,000 Units by mouth daily.    Alveda Reasons 20 MG TABS tablet TAKE 1 TABLET BY MOUTH DAILY WITH SUPPER 90 tablet 3   No current facility-administered medications for this visit.     Allergies-reviewed and updated Allergies  Allergen Reactions  . Rofecoxib     REACTION: Elevated LFT's    Social History   Social History Narrative   Married for 95 years-1st marriage for both. No kids. No pets.       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 Oropharynx normal Neck: no thyromegaly or cervical lymphadenopathy CV: RRR no murmurs rubs or gallops Lungs: CTAB no crackles, wheeze, rhonchi Abdomen: soft/nontender/nondistended/normal bowel sounds. No rebound or guarding.  Ext: no edema Skin: warm, dry Neuro: grossly normal, moves all extremities, PERRLA  msk arthritic hands noted   Assessment and Plan    85 y.o. female presenting for annual physical.  Health Maintenance counseling: 1. Anticipatory guidance: Patient counseled regarding regular dental exams -q6 months, eye exams - yearly with Dr. Kathrin Penner and sees a retinal specialist Dr. Posey Pronto as well- known macular degeration- apparently has some scar tissue but she doesn't want to risk surgery- feels like vision is getting worse-  vision still better on right- having some trouble reading paper now,  avoiding smoking and second hand smoke , limiting alcohol to 1 beverage per day- wine each night .   2. Risk factor reduction:  Advised patient of need for regular exercise and diet rich and fruits and vegetables to reduce risk of heart attack and stroke. Exercise- limited with covid- trying to walk around house- could try chair exercises- has some left elbow pain that shes using voltaren gel on with some help. Diet- has lost some weight- we discussed actually gaining weight today. Doing ensure to try to help- actually  premier protein.  Wt Readings from Last 3 Encounters:  11/01/18 102 lb 12.8 oz (46.6 kg)  05/24/18 107 lb (48.5 kg)  05/03/18 105 lb 12.8 oz (48 kg)  3. Immunizations/screenings/ancillary studies-advised fall flu shot. Immunization History  Administered Date(s) Administered  . Influenza Split 12/17/2010, 12/02/2011  . Influenza Whole 03/17/2001, 12/18/2006, 12/28/2007, 01/03/2009, 12/26/2009  . Influenza, High Dose Seasonal PF 12/22/2012, 03/06/2015, 11/26/2015, 12/09/2017  . Influenza,inj,Quad PF,6+ Mos 11/23/2013  . Influenza-Unspecified 11/20/2016  . Pneumococcal Conjugate-13 03/02/2014  . Pneumococcal Polysaccharide-23 03/17/2000, 09/16/2006  . Td 03/18/1995, 09/15/2006  . Tdap 12/17/2010  . Zoster Recombinat (Shingrix) 09/18/2016, 11/20/2016  4. Cervical cancer screening- past age based screenings. No blood or discharge 5. Breast cancer screening-wants to continue regular mammograms. Has dense breasts. May discontinue at age 83 7. Skin cancer screening-Dr. Allyson Sabal office as needed- she may discontinue this. advised regular sunscreen use. Denies worrisome, changing, or new skin lesions.  8. Birth control/STD check- mongomous/postmenopausal 9. Osteoporosis screening at 14- see osteopenia discussion below -Former smoker-quit in the 1960s though-no regular screenings needed  Status of chronic or acute  concerns  Hypertension - Taking Metoprolol 100 mg BID. Not checking BP at home. Denies HA, dizziness, CP, visual changes. Does have some SOB only with exertion- not recently worsening . Reports occasional chest palpitations  related to A Fib. Eats salt in moderation, a lot of frozen dinners. Doesn't eat fast food regularly. Not exercising, has stopped walking.  CKD stage III- GFR has been stable on recent checks.  Repeat BMP to monitor GFR today.  For arthritis has used etodolac in the past-I have discouraged this given CKD and Xarelto use-Voltaren gel not helpful.  She was to try Tylenol arthritis- mild relief with 2 tablets twice a day. Was able to cut down on etodolac from twice a day to once a day with adding tylenol arthritis. Discussed continuing to try to cut down etodolac   A Fib/TIA - Taking Digoxin 0.125 mg  1/2 tablet daily and Xarelto 20 mg daily.  Stable-continue current medications.  HR low today but she has noted high in past so do not think we can reduce dose of metoprolol.   GERD - Taking Famotidine 20 mg BID. Sx are well managed. Only issue is large pill  Hypothyroidism - Taking Levothyroxine 75 mcg daily.  Update TSH today  Osteopenia - updated bone density 03/2017 largely stable- we will repeat in next 1-2 years. She takes calcium and vitamin D- hold off on medication unless worsening #s- largely stable last check - consider medicatoin after next bone density  Depression - Taking Escitalopram 10 mg daily. PHQ 9 of 0 today- thrilled with full remission. Continue to monitor. She is not sure what improved but is just feeling better - She continues to be concerned about not being able to continue to live in her home-husband also concerned  Hyperlipidemia - Taking Atorvastatin 20 mg daily.  Update lipids today  High fall risk- encouraged cane use once again and how walker would be even more stable for her. She is tryign to do cane regularly. Vision issues likely contribute. Prior  stroke may contribute  Dry macular degeneration-continuing with ophthalmology to follow  History of mitral valve repair-done at [facility] issues at present  Recommended follow up: 6 month  Lab/Order associations:not fasting    ICD-10-CM   1. Preventative health care  Z00.00 TSH    CBC    Lipid panel    Comprehensive metabolic panel  2. Hypothyroidism due  to acquired atrophy of thyroid  E03.4 TSH  3. Hyperlipidemia, unspecified hyperlipidemia type  E78.5 CBC    Lipid panel    Comprehensive metabolic panel  4. Recurrent major depressive disorder, in full remission ([place])  F33.42    Return precautions advised.  Garret Reddish, MD

## 2018-11-01 ENCOUNTER — Ambulatory Visit (INDEPENDENT_AMBULATORY_CARE_PROVIDER_SITE_OTHER): Payer: Medicare Other | Admitting: Family Medicine

## 2018-11-01 ENCOUNTER — Other Ambulatory Visit: Payer: Self-pay | Admitting: Family Medicine

## 2018-11-01 ENCOUNTER — Other Ambulatory Visit: Payer: Self-pay

## 2018-11-01 ENCOUNTER — Encounter: Payer: Self-pay | Admitting: Family Medicine

## 2018-11-01 VITALS — BP 124/76 | HR 55 | Temp 97.5°F | Ht 61.5 in | Wt 102.8 lb

## 2018-11-01 DIAGNOSIS — E785 Hyperlipidemia, unspecified: Secondary | ICD-10-CM

## 2018-11-01 DIAGNOSIS — Z0000 Encounter for general adult medical examination without abnormal findings: Secondary | ICD-10-CM

## 2018-11-01 DIAGNOSIS — F3342 Major depressive disorder, recurrent, in full remission: Secondary | ICD-10-CM | POA: Diagnosis not present

## 2018-11-01 DIAGNOSIS — E034 Atrophy of thyroid (acquired): Secondary | ICD-10-CM

## 2018-11-01 DIAGNOSIS — E875 Hyperkalemia: Secondary | ICD-10-CM

## 2018-11-01 LAB — COMPREHENSIVE METABOLIC PANEL
ALT: 21 U/L (ref 0–35)
AST: 19 U/L (ref 0–37)
Albumin: 4.5 g/dL (ref 3.5–5.2)
Alkaline Phosphatase: 81 U/L (ref 39–117)
BUN: 34 mg/dL — ABNORMAL HIGH (ref 6–23)
CO2: 29 mEq/L (ref 19–32)
Calcium: 10.3 mg/dL (ref 8.4–10.5)
Chloride: 102 mEq/L (ref 96–112)
Creatinine, Ser: 1.23 mg/dL — ABNORMAL HIGH (ref 0.40–1.20)
GFR: 41.46 mL/min — ABNORMAL LOW (ref >60.00)
Glucose, Bld: 92 mg/dL (ref 70–99)
Potassium: 5.8 mEq/L — ABNORMAL HIGH (ref 3.5–5.1)
Sodium: 138 mEq/L (ref 135–145)
Total Bilirubin: 0.7 mg/dL (ref 0.2–1.2)
Total Protein: 6.6 g/dL (ref 6.0–8.3)

## 2018-11-01 LAB — LIPID PANEL
Cholesterol: 133 mg/dL (ref 0–200)
HDL: 61.4 mg/dL (ref >39.00)
LDL Cholesterol: 49 mg/dL (ref 0–99)
NonHDL: 71.53
Total CHOL/HDL Ratio: 2
Triglycerides: 111 mg/dL (ref 0.0–149.0)
VLDL: 22.2 mg/dL (ref 0.0–40.0)

## 2018-11-01 LAB — CBC
HCT: 41.4 % (ref 36.0–46.0)
Hemoglobin: 13.8 g/dL (ref 12.0–15.0)
MCHC: 33.4 g/dL (ref 30.0–36.0)
MCV: 98.5 fl (ref 78.0–100.0)
Platelets: 194 10*3/uL (ref 150.0–400.0)
RBC: 4.2 Mil/uL (ref 3.87–5.11)
RDW: 13.1 % (ref 11.5–15.5)
WBC: 7.3 10*3/uL (ref 4.0–10.5)

## 2018-11-01 LAB — TSH: TSH: 1.31 u[IU]/mL (ref 0.35–4.50)

## 2018-11-02 ENCOUNTER — Other Ambulatory Visit (INDEPENDENT_AMBULATORY_CARE_PROVIDER_SITE_OTHER): Payer: Medicare Other

## 2018-11-02 DIAGNOSIS — E875 Hyperkalemia: Secondary | ICD-10-CM | POA: Diagnosis not present

## 2018-11-02 LAB — BASIC METABOLIC PANEL
BUN: 33 mg/dL — ABNORMAL HIGH (ref 6–23)
CO2: 30 mEq/L (ref 19–32)
Calcium: 9.5 mg/dL (ref 8.4–10.5)
Chloride: 102 mEq/L (ref 96–112)
Creatinine, Ser: 1.15 mg/dL (ref 0.40–1.20)
GFR: 44.8 mL/min — ABNORMAL LOW (ref >60.00)
Glucose, Bld: 82 mg/dL (ref 70–99)
Potassium: 4.5 mEq/L (ref 3.5–5.1)
Sodium: 138 mEq/L (ref 135–145)

## 2018-11-10 ENCOUNTER — Ambulatory Visit: Payer: Medicare Other

## 2018-11-25 ENCOUNTER — Other Ambulatory Visit: Payer: Self-pay | Admitting: Family Medicine

## 2018-11-30 ENCOUNTER — Other Ambulatory Visit: Payer: Self-pay

## 2018-11-30 ENCOUNTER — Ambulatory Visit (INDEPENDENT_AMBULATORY_CARE_PROVIDER_SITE_OTHER): Payer: Medicare Other

## 2018-11-30 DIAGNOSIS — Z23 Encounter for immunization: Secondary | ICD-10-CM | POA: Diagnosis not present

## 2018-12-27 DIAGNOSIS — H353111 Nonexudative age-related macular degeneration, right eye, early dry stage: Secondary | ICD-10-CM | POA: Diagnosis not present

## 2018-12-27 DIAGNOSIS — H35363 Drusen (degenerative) of macula, bilateral: Secondary | ICD-10-CM | POA: Diagnosis not present

## 2018-12-27 DIAGNOSIS — H35372 Puckering of macula, left eye: Secondary | ICD-10-CM | POA: Diagnosis not present

## 2018-12-27 DIAGNOSIS — H353122 Nonexudative age-related macular degeneration, left eye, intermediate dry stage: Secondary | ICD-10-CM | POA: Diagnosis not present

## 2019-01-17 ENCOUNTER — Telehealth: Payer: Self-pay | Admitting: Family Medicine

## 2019-01-17 ENCOUNTER — Other Ambulatory Visit: Payer: Self-pay

## 2019-01-17 MED ORDER — FAMOTIDINE 20 MG PO TABS: 20.0000 mg | ORAL_TABLET | Freq: Two times a day (BID) | ORAL | 3 refills | Status: DC

## 2019-01-17 NOTE — Telephone Encounter (Signed)
famotidine (PEPCID) 20 MG tablet     Patient was advised to contact PCP by pharmacy as this medication is on backorder through mail pharmacy. She is requesting it sent to local pharmacy, if possible.    [facility] [place], [place] AT [facility] [address] [place] [phone] (Phone) [phone] (Fax)

## 2019-01-17 NOTE — Telephone Encounter (Signed)
Rx sent to local pharmacy and pt notified.

## 2019-01-17 NOTE — Telephone Encounter (Signed)
See note

## 2019-01-25 ENCOUNTER — Other Ambulatory Visit: Payer: Self-pay | Admitting: Family Medicine

## 2019-02-04 ENCOUNTER — Ambulatory Visit: Payer: Medicare Other | Admitting: Cardiovascular Disease

## 2019-02-22 NOTE — Progress Notes (Signed)
Patient ID: Theresa [name], female   DOB: 10-13-1931, 85 y.o.   MRN: [id number]     85 y.o.  has a history of mitral regurgitation, status post mitral valve repair at [facility] in 2008, chronic atrial fibrillation , prior stroke in 12/12 at [facility] (felt to be embolic), on  Xarelto for anticoagulation   Last echo 03/22/13  EF normal 55% and intact MV repair with no residual MR    11/26/15 seen by primary for right leg pain ? DJD given steroids ABI's Normal  Seen by Ortho   Seen by primary 11/26/16 with some dysphagia especially with glucosamine tablets declined EGD   She has poor balance and vision from macular degeneration I have no recent digoxin level on her or renal function   No cardiac complaints   ROS: Denies fever, malais, weight loss, blurry vision, decreased visual acuity, cough, sputum, SOB, hemoptysis, pleuritic pain, palpitaitons, heartburn, abdominal pain, melena, lower extremity edema, claudication, or rash.  All other systems reviewed and negative  General: Affect appropriate Thin frail female  HEENT: normal Neck supple with no adenopathy JVP normal no bruits no thyromegaly Lungs clear with no wheezing and good diaphragmatic motion Heart:  S1/S2 no murmur, no rub, gallop or click PMI normal Abdomen: benighn, BS positve, no tenderness, no AAA no bruit.  No HSM or HJR Distal pulses intact with no bruits No edema Neuro non-focal Skin warm and dry No muscular weakness   Current Outpatient Medications  Medication Sig Dispense Refill  . Acetaminophen (TYLENOL ARTHRITIS PAIN PO) Take by mouth.    Marland Kitchen atorvastatin (LIPITOR) 20 MG tablet TAKE 1 TABLET BY MOUTH EVERY EVENING 90 tablet 1  . Calcium Carbonate-Vitamin D (CALTRATE 600+D) 600-400 MG-UNIT per tablet Take 1 tablet by mouth 2 (two) times daily.      . Cholecalciferol (VITAMIN D) 2000 UNITS CAPS Take 2,000 Units by mouth daily.     . digoxin (LANOXIN) 0.125 MG tablet Take 0.5 tablets (62.5 mcg  total) by mouth daily. 90 tablet 1  . escitalopram (LEXAPRO) 10 MG tablet TAKE 1 TABLET BY MOUTH DAILY 90 tablet 2  . famotidine (PEPCID) 20 MG tablet Take 1 tablet (20 mg total) by mouth 2 (two) times daily. 180 tablet 3  . glucosamine-chondroitin 500-400 MG tablet Take 1 tablet by mouth 2 (two) times daily.      Marland Kitchen levothyroxine (SYNTHROID, LEVOTHROID) 75 MCG tablet TAKE ONE TABLET BY MOUTH ONCE DAILY BEFORE BREAKFAST 90 tablet 3  . metoprolol tartrate (LOPRESSOR) 100 MG tablet TAKE 1 TABLET BY MOUTH TWO TIMES DAILY 180 tablet 2  . Multiple Vitamins-Minerals (PRESERVISION AREDS 2 PO) Take 1 tablet by mouth 2 (two) times daily. Reported on 09/04/2015    . vitamin A 10000 UNIT capsule Take 10,000 Units by mouth daily.    . VOLTAREN 1 % GEL Apply topically 4 (four) times daily.    Alveda Reasons 20 MG TABS tablet TAKE 1 TABLET BY MOUTH DAILY WITH SUPPER 90 tablet 3   No current facility-administered medications for this visit.    Allergies  Rofecoxib  Electrocardiogram:     02/02/18   afib rate 59 nonspecific ST changes   Assessment and Plan  MV Repair: SBE prophylaxis  No murmur on exam intact by echo 2015 will update   PAF: On xarelto good rate control Appears to have chronic afib   Thyroid:  Lab Results  Component Value Date   TSH 1.31 11/01/2018   Chol:  Lab Results  Component Value Date   LDLCALC 49 11/01/2018   Leg Pain: ? DJD ABI's were normal 12/05/15  no evidence of embolic event compliant with NOAC   Dysphagia:not interested in EGD cutting glucosamine tabs in half   Depression: controlled with Lexapro   Gait:  Seems neurologic and related to loss of visual cues with macular degeneration Needs Dig level To r/o toxicity will get today other labs ok in August with primary   F/U in a year    Jenkins Rouge

## 2019-02-24 ENCOUNTER — Other Ambulatory Visit: Payer: Self-pay

## 2019-02-24 ENCOUNTER — Ambulatory Visit (INDEPENDENT_AMBULATORY_CARE_PROVIDER_SITE_OTHER): Payer: Medicare Other | Admitting: Cardiovascular Disease

## 2019-02-24 ENCOUNTER — Encounter: Payer: Self-pay | Admitting: Cardiovascular Disease

## 2019-02-24 VITALS — BP 162/86 | HR 60 | Ht 61.5 in | Wt 105.0 lb

## 2019-02-24 DIAGNOSIS — I482 Chronic atrial fibrillation, unspecified: Secondary | ICD-10-CM

## 2019-02-24 DIAGNOSIS — Z79899 Other long term (current) drug therapy: Secondary | ICD-10-CM | POA: Diagnosis not present

## 2019-02-24 NOTE — Patient Instructions (Signed)
Medication Instructions:  Your physician recommends that you continue on your current medications as directed. Please refer to the Current Medication list given to you today.  Labwork: Your physician recommends that you have a digoxin level   Testing/Procedures: NONE  Follow-Up: Your physician wants you to follow-up in: 6 months with Dr. Johnsie Cancel. You will receive a reminder letter in the mail two months in advance. If you don't receive a letter, please call our office to schedule the follow-up appointment.   If you need a refill on your cardiac medications before your next appointment, please call your pharmacy.

## 2019-02-24 NOTE — Addendum Note (Signed)
Addended by: Aris Georgia, Aleem Elza L on: 02/24/2019 04:27 PM   Modules accepted: Orders

## 2019-02-25 LAB — DIGOXIN LEVEL: Digoxin, Serum: <0.4 ng/mL — ABNORMAL LOW (ref 0.5–0.9)

## 2019-03-01 NOTE — Addendum Note (Signed)
Addended by: Jacinta Shoe on: 03/01/2019 01:07 PM   Modules accepted: Orders

## 2019-04-06 DIAGNOSIS — Z1231 Encounter for screening mammogram for malignant neoplasm of breast: Secondary | ICD-10-CM | POA: Diagnosis not present

## 2019-04-06 LAB — HM MAMMOGRAPHY

## 2019-04-11 DIAGNOSIS — H353122 Nonexudative age-related macular degeneration, left eye, intermediate dry stage: Secondary | ICD-10-CM | POA: Diagnosis not present

## 2019-04-11 DIAGNOSIS — H35372 Puckering of macula, left eye: Secondary | ICD-10-CM | POA: Diagnosis not present

## 2019-04-11 DIAGNOSIS — H353111 Nonexudative age-related macular degeneration, right eye, early dry stage: Secondary | ICD-10-CM | POA: Diagnosis not present

## 2019-04-11 DIAGNOSIS — H53412 Scotoma involving central area, left eye: Secondary | ICD-10-CM | POA: Diagnosis not present

## 2019-04-12 ENCOUNTER — Other Ambulatory Visit: Payer: Self-pay

## 2019-04-12 MED ORDER — LEVOTHYROXINE SODIUM 75 MCG PO TABS: ORAL_TABLET | ORAL | 3 refills | Status: DC

## 2019-04-20 DIAGNOSIS — K1329 Other disturbances of oral epithelium, including tongue: Secondary | ICD-10-CM | POA: Diagnosis not present

## 2019-05-04 ENCOUNTER — Other Ambulatory Visit: Payer: Self-pay

## 2019-05-04 ENCOUNTER — Ambulatory Visit (INDEPENDENT_AMBULATORY_CARE_PROVIDER_SITE_OTHER): Payer: Medicare Other | Admitting: Family Medicine

## 2019-05-04 ENCOUNTER — Encounter: Payer: Self-pay | Admitting: Family Medicine

## 2019-05-04 VITALS — BP 130/72 | HR 64 | Temp 97.0°F | Ht 61.5 in | Wt 105.6 lb

## 2019-05-04 DIAGNOSIS — F3342 Major depressive disorder, recurrent, in full remission: Secondary | ICD-10-CM

## 2019-05-04 DIAGNOSIS — I10 Essential (primary) hypertension: Secondary | ICD-10-CM | POA: Diagnosis not present

## 2019-05-04 DIAGNOSIS — E034 Atrophy of thyroid (acquired): Secondary | ICD-10-CM | POA: Diagnosis not present

## 2019-05-04 DIAGNOSIS — I4811 Longstanding persistent atrial fibrillation: Secondary | ICD-10-CM

## 2019-05-04 DIAGNOSIS — N183 Chronic kidney disease, stage 3 unspecified: Secondary | ICD-10-CM | POA: Diagnosis not present

## 2019-05-04 DIAGNOSIS — E785 Hyperlipidemia, unspecified: Secondary | ICD-10-CM | POA: Diagnosis not present

## 2019-05-04 DIAGNOSIS — G3281 Cerebellar ataxia in diseases classified elsewhere: Secondary | ICD-10-CM

## 2019-05-04 LAB — COMPREHENSIVE METABOLIC PANEL
ALT: 21 U/L (ref 0–35)
AST: 21 U/L (ref 0–37)
Albumin: 4.5 g/dL (ref 3.5–5.2)
Alkaline Phosphatase: 82 U/L (ref 39–117)
BUN: 39 mg/dL — ABNORMAL HIGH (ref 6–23)
CO2: 32 mEq/L (ref 19–32)
Calcium: 9.9 mg/dL (ref 8.4–10.5)
Chloride: 102 mEq/L (ref 96–112)
Creatinine, Ser: 1.1 mg/dL (ref 0.40–1.20)
GFR: 47.11 mL/min — ABNORMAL LOW (ref >60.00)
Glucose, Bld: 80 mg/dL (ref 70–99)
Potassium: 5.5 mEq/L — ABNORMAL HIGH (ref 3.5–5.1)
Sodium: 139 mEq/L (ref 135–145)
Total Bilirubin: 0.5 mg/dL (ref 0.2–1.2)
Total Protein: 6.5 g/dL (ref 6.0–8.3)

## 2019-05-04 LAB — TSH: TSH: 2.17 u[IU]/mL (ref 0.35–4.50)

## 2019-05-04 NOTE — Progress Notes (Signed)
Phone [phone] In person visit   Subjective:   Theresa [name] is a 86 y.o. year old very pleasant female patient who presents for/with See problem oriented charting Chief Complaint  Patient presents with  . Follow-up    no covid vaccine  . Hypothyroidism   ROS- some right shin pain but no calf pain or swelling, continue arthritic pains in hands and left elbow.   This visit occurred during the SARS-CoV-2 public health emergency.  Safety protocols were in place, including screening questions prior to the visit, additional usage of staff PPE, and extensive cleaning of exam room while observing appropriate contact time as indicated for disinfecting solutions.   Past Medical History-  Patient Active Problem List   Diagnosis Date Noted  . TIA (transient ischemic attack) 03/21/2013    Priority: High  . Hx of ischemic left MCA stroke 04/08/2011    Priority: High  . Atrial fibrillation ([place]) 07/07/2007    Priority: High  . History of mitral valve repair 09/11/2006    Priority: High  . Hyperlipidemia 09/01/2014    Priority: Medium  . CKD (chronic kidney disease), stage III ([place]) 03/02/2014    Priority: Medium  . Major depression in full remission ([place]) 11/23/2013    Priority: Medium  . Hypertension 03/04/2012    Priority: Medium  . GASTROESOPHAGEAL REFLUX DISEASE, MILD 06/04/2009    Priority: Medium  . Hypothyroidism 11/23/2006    Priority: Medium  . Osteopenia 11/23/2006    Priority: Medium  . Osteoarthritis 09/11/2006    Priority: Medium  . Rosacea 11/23/2013    Priority: Low  . 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 PERSONAL HX COLONIC POLYPS 10/22/2009    Priority: Low  . CHANGE IN BOWELS 09/20/2009    Priority: Low  . Raynaud's syndrome 03/06/2009    Priority: Low  . SCOLIOSIS, LUMBAR SPINE 01/03/2009    Priority: Low  . Cerebellar ataxia in diseases classified elsewhere ([place]) 05/04/2019  . Balance disorder  05/03/2018  . Macular degeneration 03/31/2017  . Difficulty swallowing pills 11/26/2016    Medications- reviewed and updated Current Outpatient Medications  Medication Sig Dispense Refill  . Acetaminophen (TYLENOL ARTHRITIS PAIN PO) Take by mouth.    Marland Kitchen atorvastatin (LIPITOR) 20 MG tablet TAKE 1 TABLET BY MOUTH EVERY EVENING 90 tablet 1  . Calcium Carbonate-Vitamin D (CALTRATE 600+D) 600-400 MG-UNIT per tablet Take 1 tablet by mouth 2 (two) times daily.      . Cholecalciferol (VITAMIN D) 2000 UNITS CAPS Take 2,000 Units by mouth daily.     . digoxin (LANOXIN) 0.125 MG tablet Take 0.5 tablets (62.5 mcg total) by mouth daily. 90 tablet 1  . escitalopram (LEXAPRO) 10 MG tablet TAKE 1 TABLET BY MOUTH DAILY 90 tablet 2  . famotidine (PEPCID) 20 MG tablet Take 1 tablet (20 mg total) by mouth 2 (two) times daily. 180 tablet 3  . glucosamine-chondroitin 500-400 MG tablet Take 1 tablet by mouth 2 (two) times daily.      Marland Kitchen levothyroxine (SYNTHROID) 75 MCG tablet One tab daily before breakfast 90 tablet 3  . metoprolol tartrate (LOPRESSOR) 100 MG tablet TAKE 1 TABLET BY MOUTH TWO TIMES DAILY 180 tablet 2  . Multiple Vitamins-Minerals (PRESERVISION AREDS 2 PO) Take 1 tablet by mouth 2 (two) times daily. Reported on 09/04/2015    . vitamin A 10000 UNIT capsule Take 10,000 Units by mouth daily.    . VOLTAREN 1 % GEL Apply topically  4 (four) times daily.    Alveda Reasons 20 MG TABS tablet TAKE 1 TABLET BY MOUTH DAILY WITH SUPPER 90 tablet 3   No current facility-administered medications for this visit.     Objective:  BP 130/72   Pulse 64   Temp (!) 97 F (36.1 C)   Ht 5' 1.5" (1.562 m)   Wt 105 lb 9.6 oz (47.9 kg)   LMP  (LMP Unknown)   SpO2 99%   BMI 19.63 kg/m  Gen: NAD, resting comfortably CV: RRR no murmurs rubs or gallops Lungs: CTAB no crackles, wheeze, rhonchi guarding.  Ext: no edema Skin: warm, dry Neuro: walks with cane    Assessment and Plan    #Persistent Atrial  fibrillation S: On Xarelto for anticoagulation.  On digoxin.  On metoprolol 100 mg twice a day for rate control. A/P: Appropriately anticoagulated.  She is also rate controlled.  Continue current medication  #balance issues S: Patient has seen neurology May 24, 2018.  MRI of brain was ordered but that was right at the beginning of COVID-19 pandemic and did not appear was completed-prior MRI of the brain 2015 showed cerebellar infarctions from the past -still with vision issues - feels like balance is worse. Bumps into walls/door frames. No falls.  A/P:   #Hypertension S: Controlled on metoprolol alone 100 mg twice daily A/P: Excellent control-continue current medication   #Hypothyroidism S: Compliant with levothyroxine 75 mcg Lab Results  Component Value Date   TSH 1.31 11/01/2018  A/P: Well-controlled on last check-update TSH today   #CKD stage III #osteoarthritis hands/elbows. voltaren failure S: Compliant with blood pressure medications.  Sparing etodolac in the past-have discouraged this with CKD and Xarelto use.  Voltaren gel not helpful as above.  Last visit patient was down to etodolac once a day with use of Tylenol- she was able to cut this out.   Minimal relief with voltaren but still trying on her elbows twice a day- worse on left right now A/P: thrilled patient ale to get off etodolac with tylenol arthritis. Could consider tramadol if symptoms worsen . Trouble trimming nails with how bad arthritis is.   For CKD III- update GFR today and avoid nsaids still   #Hyperlipidemia S: Compliant with atorvastatin 20 mg Lab Results  Component Value Date   CHOL 133 11/01/2018   HDL 61.40 11/01/2018   LDLCALC 49 11/01/2018   LDLDIRECT 47.0 09/04/2015   TRIG 111.0 11/01/2018   CHOLHDL 2 11/01/2018   A/P: Excellent control on current medication-we will continue current meds  # Depression S: compliant with lexapro 10mg  A/P: reasonable control- continue current rx with phq9  under 5  # low weight- does a premier protein shake - up 3 lbs from august   Recommended follow up: Return in about 6 months (around 11/01/2019) for physical or sooner if needed.   Lab/Order associations:   ICD-10-CM   1. Longstanding persistent atrial fibrillation (HCC)  I48.11   2. Hypothyroidism due to acquired atrophy of thyroid  E03.4   3. Stage 3 chronic kidney disease, unspecified whether stage 3a or 3b CKD  N18.30   4. Essential hypertension  I10   5. Hyperlipidemia, unspecified hyperlipidemia type  E78.5   6. Cerebellar ataxia in diseases classified elsewhere ([place])  G32.81   7. Recurrent major depressive disorder, in full remission ([place]) Chronic F33.42    Return precautions advised.  Garret Reddish, MD

## 2019-05-04 NOTE — Patient Instructions (Addendum)
Please stop by lab before you go If you do not have mychart- we will call you about results within 5 business days of [place] receiving them.  If you have mychart- we will send your results within 3 business days of [place] receiving them.  If abnormal or we want to clarify a result, we will call or mychart you to make sure you receive the message.  If you have questions or concerns or don't hear within 5-7 days, please send [place] a message or call us.    No changes planned today. Could consider tramadol for pain in hands if worsens as we really want to avoid etodolac but glad tylenol helping for now   Recommended follow up: Return in about 6 months (around 11/01/2019) for physical or sooner if needed.

## 2019-05-10 ENCOUNTER — Other Ambulatory Visit: Payer: Self-pay

## 2019-05-10 DIAGNOSIS — E875 Hyperkalemia: Secondary | ICD-10-CM

## 2019-05-10 MED ORDER — RIVAROXABAN 20 MG PO TABS: ORAL_TABLET | ORAL | 3 refills | Status: DC

## 2019-06-13 DIAGNOSIS — H353132 Nonexudative age-related macular degeneration, bilateral, intermediate dry stage: Secondary | ICD-10-CM | POA: Diagnosis not present

## 2019-06-13 DIAGNOSIS — H4720 Unspecified optic atrophy: Secondary | ICD-10-CM | POA: Diagnosis not present

## 2019-06-13 DIAGNOSIS — H40013 Open angle with borderline findings, low risk, bilateral: Secondary | ICD-10-CM | POA: Diagnosis not present

## 2019-06-24 ENCOUNTER — Telehealth: Payer: Self-pay | Admitting: Family Medicine

## 2019-06-24 NOTE — Telephone Encounter (Signed)
Left message for patient to call back and schedule Medicare Annual Wellness Visit (AWV) either virtually/audio only OR in office. Whatever the patients preference is.  Last AWV 10/30/17; please schedule at anytime with [facility]-Nurse Health Advisor at [facility].

## 2019-06-27 ENCOUNTER — Ambulatory Visit (INDEPENDENT_AMBULATORY_CARE_PROVIDER_SITE_OTHER): Payer: Medicare Other

## 2019-06-27 ENCOUNTER — Other Ambulatory Visit: Payer: Self-pay

## 2019-06-27 DIAGNOSIS — Z0000 Encounter for general adult medical examination without abnormal findings: Secondary | ICD-10-CM | POA: Diagnosis not present

## 2019-06-27 MED ORDER — METOPROLOL TARTRATE 100 MG PO TABS: 100.0000 mg | ORAL_TABLET | Freq: Two times a day (BID) | ORAL | 2 refills | Status: DC

## 2019-06-27 NOTE — Progress Notes (Signed)
This visit is being conducted via phone call due to the COVID-19 pandemic. This patient has given me verbal consent via phone to conduct this visit, patient states they are participating from their home address. Some vital signs may be absent or patient reported.   Patient identification: identified by name, DOB, and current address.  Location provider: [facility], Office Persons participating in the virtual visit: Denman George LPN, patient, and Dr. Garret Reddish   Subjective:   Theresa [name] is a 86 y.o. female who presents for Medicare Annual (Subsequent) preventive examination.  Review of Systems:   Cardiac Risk Factors include: advanced age (>5men, >71 women);dyslipidemia    Objective:     Vitals: LMP  (LMP Unknown)   There is no height or weight on file to calculate BMI.  Advanced Directives 06/27/2019 10/30/2017 10/29/2016 06/24/2016 09/28/2015 03/21/2013 01/09/2012  Does Patient Have a Medical Advance Directive? Yes Yes Yes Yes Yes Patient has advance directive, copy not in chart Patient does not have advance directive  Type of Advance Directive Living will;Healthcare Power of [place];Living will - [place];Living will - [place];Living will -  Does patient want to make changes to medical advance directive? No - Patient declined - - No - Patient declined - No -  Copy of [place] in Chart? Yes - validated most recent copy scanned in chart (See row information) Yes - Yes - Copy requested from family -  Pre-existing out of facility DNR order (yellow form or pink MOST form) - - - - - No No    Tobacco Social History   Tobacco Use  Smoking Status Former Smoker  . Quit date: 04/28/1966  . Years since quitting: 53.2  Smokeless Tobacco Never Used     Counseling given: Not Answered   Clinical Intake:  Pre-visit preparation completed: Yes  Pain : No/denies pain  Diabetes: No  How  often do you need to have someone help you when you read instructions, pamphlets, or other written materials from your doctor or pharmacy?: 1 - Never  Interpreter Needed?: No  Information entered by :: Denman George LPN  Past Medical History:  Diagnosis Date  . Atrial fibrillation (HCC)    AFIB  . Collagenous colitis   . CVA (cerebral vascular accident) ([place])    a.  L parietal CVA by MRI [id number], likely embolic;  b.  TEE by report with neg bubble study;  c.  carotid dopplers  12/12:  + plaque, no sig ICA stenosis   . History of postmenopausal HRT   . Hx of adenomatous colonic polyps   . Hypothyroidism   . MVP (mitral valve prolapse)    a. s/p MV repair at [facility] in 2008;  b. Echocardiogram 7/12: EF 50%, status post mitral valve repair with mild MR and minimal MS, mean gradient 4, moderate LAE, LA diam 55 mm; mild RAE, PASP 28-32;   Marland Kitchen Osteoarthritis   . Osteoporosis    Past Surgical History:  Procedure Laterality Date  . CARDIOVERSION  01/09/2012   Procedure: CARDIOVERSION;  Surgeon: Jolaine Artist, MD;  Location: [facility] ENDOSCOPY;  Service: Cardiovascular;  Laterality: N/A;  . CATARACT EXTRACTION    . CHOLECYSTECTOMY    . DILATION AND CURETTAGE OF UTERUS    . LUMBAR FUSION    . MITRAL VALVE REPAIR  2008  . TONSILLECTOMY     Family History  Problem Relation Age of Onset  . Cancer  Father        COLON  . Stroke Neg Hx        1ST DEGREE RELATIVE <60   Social History   Socioeconomic History  . Marital status: Married    Spouse name: Not on file  . Number of children: Not on file  . Years of education: Not on file  . Highest education level: Master's degree (e.g., MA, MS, MEng, MEd, MSW, MBA)  Occupational History  . Occupation: RETIRED    Employer: RETIRED    Comment: Engineer, mining  Tobacco Use  . Smoking status: Former Smoker    Quit date: 04/28/1966    Years since quitting: 53.2  . Smokeless tobacco: Never Used  Substance and Sexual Activity  . Alcohol use: Yes     Alcohol/week: 1.0 standard drinks    Types: 1 Glasses of wine per week    Comment: 1 small glass with luch and dinner  . Drug use: No  . Sexual activity: Not Currently  Other Topics Concern  . Not on file  Social History Narrative   Married for 44 years-1st marriage for both. No kids. No pets.       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 Difficulty of Paying Living Expenses:   Food Insecurity:   . Worried About Charity fundraiser in the Last Year:   . Arboriculturist in the Last Year:   Transportation Needs:   . Film/video editor (Medical):   Marland Kitchen Lack of Transportation (Non-Medical):   Physical Activity:   . Days of Exercise per Week:   . Minutes of Exercise per Session:   Stress:   . Feeling of Stress :   Social Connections:   . Frequency of Communication with Friends and Family:   . Frequency of Social Gatherings with Friends and Family:   . Attends Religious Services:   . Active Member of Clubs or Organizations:   . Attends Archivist Meetings:   Marland Kitchen Marital Status:     Outpatient Encounter Medications as of 06/27/2019  Medication Sig  . Acetaminophen (TYLENOL ARTHRITIS PAIN PO) Take by mouth.  Marland Kitchen atorvastatin (LIPITOR) 20 MG tablet TAKE 1 TABLET BY MOUTH EVERY EVENING  . Calcium Carbonate-Vitamin D (CALTRATE 600+D) 600-400 MG-UNIT per tablet Take 1 tablet by mouth 2 (two) times daily.    . Cholecalciferol (VITAMIN D) 2000 UNITS CAPS Take 2,000 Units by mouth daily.   . digoxin (LANOXIN) 0.125 MG tablet Take 0.5 tablets (62.5 mcg total) by mouth daily.  Marland Kitchen escitalopram (LEXAPRO) 10 MG tablet TAKE 1 TABLET BY MOUTH DAILY  . famotidine (PEPCID) 20 MG tablet Take 1 tablet (20 mg total) by mouth 2 (two) times daily.  Marland Kitchen  glucosamine-chondroitin 500-400 MG tablet Take 1 tablet by mouth 2 (two) times daily.    Marland Kitchen levothyroxine (SYNTHROID) 75 MCG tablet One tab daily before breakfast  . metoprolol tartrate (LOPRESSOR) 100 MG tablet TAKE 1 TABLET BY MOUTH TWO TIMES DAILY  . Multiple Vitamins-Minerals (PRESERVISION AREDS 2 PO) Take 1 tablet by mouth 2 (two) times daily. Reported on 09/04/2015  . rivaroxaban (XARELTO) 20 MG TABS tablet TAKE 1 TABLET BY MOUTH DAILY WITH SUPPER  .  vitamin A 10000 UNIT capsule Take 10,000 Units by mouth daily.  . VOLTAREN 1 % GEL Apply topically 4 (four) times daily.   No facility-administered encounter medications on file as of 06/27/2019.    Activities of Daily Living In your present state of health, do you have any difficulty performing the following activities: 06/27/2019  Hearing? N  Vision? N  Difficulty concentrating or making decisions? N  Walking or climbing stairs? N  Dressing or bathing? N  Doing errands, shopping? N  Preparing Food and eating ? N  Using the Toilet? N  In the past six months, have you accidently leaked urine? N  Do you have problems with loss of bowel control? N  Managing your Medications? N  Managing your Finances? N  Housekeeping or managing your Housekeeping? N  Some recent data might be hidden    Patient Care Team: Marin Olp, MD as PCP - General (Family Medicine) Josue Hector, MD as PCP - Cardiology (Cardiology) Shon Hough, MD as Consulting Physician (Ophthalmology) Ricki Miller, Tana Felts, MD as Referring Physician (Ophthalmology) Star Age, MD as Consulting Physician (Neurology)    Assessment:   This is a routine wellness examination for Kerry.  Exercise Activities and Dietary recommendations Current Exercise Habits: The patient does not participate in regular exercise at present  Goals    . patient     Did like to travel; think about options; Private trip with other; plan something away this year     . patient      Improve balance I will send EMMI information [email or web address]          Fall Risk Fall Risk  06/27/2019 11/01/2018 10/30/2017 10/29/2016 09/28/2015  Falls in the past year? 1 1 No Yes No  Number falls in past yr: 1 1 - 1 -  Injury with Fall? 0 0 - - -  Risk for fall due to : Impaired balance/gait;Impaired mobility;History of fall(s) Impaired balance/gait;History of fall(s);Impaired vision Impaired balance/gait - -  Follow up Falls evaluation completed;Education provided;Falls prevention discussed - - Education provided -   Is the patient's home free of loose throw rugs in walkways, pet beds, electrical cords, etc?   yes      Grab bars in the bathroom? yes      Handrails on the stairs?   yes      Adequate lighting?   yes   Depression Screen PHQ 2/9 Scores 06/27/2019 05/04/2019 11/01/2018 05/03/2018  PHQ - 2 Score 0 0 0 2  PHQ- 9 Score - 0 0 9  Exception Documentation - - - -  Not completed - - - -     Cognitive Function MMSE - Mini Mental State Exam 10/30/2017 10/29/2016 09/28/2015 02/15/2014  Not completed: - - (No Data) -  Orientation to time 4 5 4 5   Orientation to Place 5 5 5 5   Registration 3 3 3 3   Attention/ Calculation 5 5 5 3   Recall 2 3 2 3   Language- name 2 objects 2 2 2 2   Language- repeat 1 1 1 1   Language- follow 3 step command 3 3 3 3   Language- read & follow direction 1 1 1 1   Write a sentence 1 1 1 1   Copy design 1 1 1 1   Total score 28 30 28 28      6CIT Screen 06/27/2019  What Year? 0 points  What month? 0 points  What time? 0 points  Count back from 20 0 points  Months  in reverse 0 points  Repeat phrase 0 points  Total Score 0    Immunization History  Administered Date(s) Administered  . Fluad Quad(high Dose 65+) 11/30/2018  . Influenza Split 12/17/2010, 12/02/2011  . Influenza Whole 03/17/2001, 12/18/2006, 12/28/2007, 01/03/2009, 12/26/2009  . Influenza, High Dose Seasonal PF 12/22/2012, 03/06/2015, 11/26/2015, 12/09/2017  . Influenza,inj,Quad  PF,6+ Mos 11/23/2013  . Influenza-Unspecified 11/20/2016  . Pneumococcal Conjugate-13 03/02/2014  . Pneumococcal Polysaccharide-23 03/17/2000, 09/16/2006  . Td 03/18/1995, 09/15/2006  . Tdap 12/17/2010  . Zoster Recombinat (Shingrix) 09/18/2016, 11/20/2016    Qualifies for Shingles Vaccine? Shingrix completed   Screening Tests Health Maintenance  Topic Date Due  . INFLUENZA VACCINE  10/16/2019  . TETANUS/TDAP  12/16/2020  . DEXA SCAN  Completed  . PNA vac Low Risk Adult  Completed    Cancer Screenings: Lung: Low Dose CT Chest recommended if Age 34-80 years, 30 pack-year currently smoking OR have quit w/in 15years. Patient does not qualify. Breast:  Up to date on Mammogram? Yes   Up to date of Bone Density/Dexa? Yes Colorectal: No longer indicated     Plan:  I have personally reviewed and addressed the Medicare Annual Wellness questionnaire and have noted the following in the patient's chart:  A. Medical and social history B. Use of alcohol, tobacco or illicit drugs  C. Current medications and supplements D. Functional ability and status E.  Nutritional status F.  Physical activity G. Advance directives H. List of other physicians I.  Hospitalizations, surgeries, and ER visits in previous 12 months J.  [place] such as hearing and vision if needed, cognitive and depression L. Referrals, records requested, and appointments- none   In addition, I have reviewed and discussed with patient certain preventive protocols, quality metrics, and best practice recommendations. A written personalized care plan for preventive services as well as general preventive health recommendations were provided to patient.   Signed,  Denman George, LPN  Nurse Health Advisor   Nurse Notes: no additional

## 2019-06-27 NOTE — Patient Instructions (Signed)
Theresa [name] , Thank you for taking time to come for your Medicare Wellness Visit. I appreciate your ongoing commitment to your health goals. Please review the following plan we discussed and let me know if I can assist you in the future.   Screening recommendations/referrals: Colorectal Screening: up to date; no longer indicated  Mammogram: up to date; last 04/06/19 Bone Density: up to date; last 04/01/17  Vision and Dental Exams: Recommended annual ophthalmology exams for early detection of glaucoma and other disorders of the eye Recommended annual dental exams for proper oral hygiene  Vaccinations: Influenza vaccine: completed 11/30/18 Pneumococcal vaccine: up to date; last 03/02/14 Tdap vaccine: up to date; last 12/17/10  Shingles vaccine: Shingrix completed  Covid vaccine:  In progress   Advanced directives: We have received a copy of your POA (Power of [place]) and/or Living Will. These documents can be located in your chart.  Goals: Recommend to drink at least 6-8 8oz glasses of water per day and consume a balanced diet rich in fresh fruits and vegetables.   Next appointment: Please schedule your Annual Wellness Visit with your Nurse Health Advisor in one year.  Preventive Care 84 Years and Older, Female Preventive care refers to lifestyle choices and visits with your health care provider that can promote health and wellness. What does preventive care include?  A yearly physical exam. This is also called an annual well check.  Dental exams once or twice a year.  Routine eye exams. Ask your health care provider how often you should have your eyes checked.  Personal lifestyle choices, including:  Daily care of your teeth and gums.  Regular physical activity.  Eating a healthy diet.  Avoiding tobacco and drug use.  Limiting alcohol use.  Practicing safe sex.  Taking low-dose aspirin every day if recommended by your health care provider.  Taking vitamin and mineral  supplements as recommended by your health care provider. What happens during an annual well check? The services and screenings done by your health care provider during your annual well check will depend on your age, overall health, lifestyle risk factors, and family history of disease. Counseling  Your health care provider may ask you questions about your:  Alcohol use.  Tobacco use.  Drug use.  Emotional well-being.  Home and relationship well-being.  Sexual activity.  Eating habits.  History of falls.  Memory and ability to understand (cognition).  Work and work Statistician.  Reproductive health. Screening  You may have the following tests or measurements:  Height, weight, and BMI.  Blood pressure.  Lipid and cholesterol levels. These may be checked every 5 years, or more frequently if you are over 50 years old.  Skin check.  Lung cancer screening. You may have this screening every year starting at age 37 if you have a 30-pack-year history of smoking and currently smoke or have quit within the past 15 years.  Fecal occult blood test (FOBT) of the stool. You may have this test every year starting at age 61.  Flexible sigmoidoscopy or colonoscopy. You may have a sigmoidoscopy every 5 years or a colonoscopy every 10 years starting at age 50.  Hepatitis C blood test.  Hepatitis B blood test.  Sexually transmitted disease (STD) testing.  Diabetes screening. This is done by checking your blood sugar (glucose) after you have not eaten for a while (fasting). You may have this done every 1-3 years.  Bone density scan. This is done to screen for osteoporosis. You may have  this done starting at age 61.  Mammogram. This may be done every 1-2 years. Talk to your health care provider about how often you should have regular mammograms. Talk with your health care provider about your test results, treatment options, and if necessary, the need for more tests. Vaccines  Your  health care provider may recommend certain vaccines, such as:  Influenza vaccine. This is recommended every year.  Tetanus, diphtheria, and acellular pertussis (Tdap, Td) vaccine. You may need a Td booster every 10 years.  Zoster vaccine. You may need this after age 90.  Pneumococcal 13-valent conjugate (PCV13) vaccine. One dose is recommended after age 14.  Pneumococcal polysaccharide (PPSV23) vaccine. One dose is recommended after age 19. Talk to your health care provider about which screenings and vaccines you need and how often you need them. This information is not intended to replace advice given to you by your health care provider. Make sure you discuss any questions you have with your health care provider. Document Released: 03/30/2015 Document Revised: 11/21/2015 Document Reviewed: 01/02/2015 Elsevier Interactive Patient Education  2017 [place] Prevention in the Home Falls can cause injuries. They can happen to people of all ages. There are many things you can do to make your home safe and to help prevent falls. What can I do on the outside of my home?  Regularly fix the edges of walkways and driveways and fix any cracks.  Remove anything that might make you trip as you walk through a door, such as a raised step or threshold.  Trim any bushes or trees on the path to your home.  Use bright outdoor lighting.  Clear any walking paths of anything that might make someone trip, such as rocks or tools.  Regularly check to see if handrails are loose or broken. Make sure that both sides of any steps have handrails.  Any raised decks and porches should have guardrails on the edges.  Have any leaves, snow, or ice cleared regularly.  Use sand or salt on walking paths during winter.  Clean up any spills in your garage right away. This includes oil or grease spills. What can I do in the bathroom?  Use night lights.  Install grab bars by the toilet and in the tub and  shower. Do not use towel bars as grab bars.  Use non-skid mats or decals in the tub or shower.  If you need to sit down in the shower, use a plastic, non-slip stool.  Keep the floor dry. Clean up any water that spills on the floor as soon as it happens.  Remove soap buildup in the tub or shower regularly.  Attach bath mats securely with double-sided non-slip rug tape.  Do not have throw rugs and other things on the floor that can make you trip. What can I do in the bedroom?  Use night lights.  Make sure that you have a light by your bed that is easy to reach.  Do not use any sheets or blankets that are too big for your bed. They should not hang down onto the floor.  Have a firm chair that has side arms. You can use this for support while you get dressed.  Do not have throw rugs and other things on the floor that can make you trip. What can I do in the kitchen?  Clean up any spills right away.  Avoid walking on wet floors.  Keep items that you use a lot in easy-to-reach  places.  If you need to reach something above you, use a strong step stool that has a grab bar.  Keep electrical cords out of the way.  Do not use floor polish or wax that makes floors slippery. If you must use wax, use non-skid floor wax.  Do not have throw rugs and other things on the floor that can make you trip. What can I do with my stairs?  Do not leave any items on the stairs.  Make sure that there are handrails on both sides of the stairs and use them. Fix handrails that are broken or loose. Make sure that handrails are as long as the stairways.  Check any carpeting to make sure that it is firmly attached to the stairs. Fix any carpet that is loose or worn.  Avoid having throw rugs at the top or bottom of the stairs. If you do have throw rugs, attach them to the floor with carpet tape.  Make sure that you have a light switch at the top of the stairs and the bottom of the stairs. If you do not  have them, ask someone to add them for you. What else can I do to help prevent falls?  Wear shoes that:  Do not have high heels.  Have rubber bottoms.  Are comfortable and fit you well.  Are closed at the toe. Do not wear sandals.  If you use a stepladder:  Make sure that it is fully opened. Do not climb a closed stepladder.  Make sure that both sides of the stepladder are locked into place.  Ask someone to hold it for you, if possible.  Clearly mark and make sure that you can see:  Any grab bars or handrails.  First and last steps.  Where the edge of each step is.  Use tools that help you move around (mobility aids) if they are needed. These include:  Canes.  Walkers.  Scooters.  Crutches.  Turn on the lights when you go into a dark area. Replace any light bulbs as soon as they burn out.  Set up your furniture so you have a clear path. Avoid moving your furniture around.  If any of your floors are uneven, fix them.  If there are any pets around you, be aware of where they are.  Review your medicines with your doctor. Some medicines can make you feel dizzy. This can increase your chance of falling. Ask your doctor what other things that you can do to help prevent falls. This information is not intended to replace advice given to you by your health care provider. Make sure you discuss any questions you have with your health care provider. Document Released: 12/28/2008 Document Revised: 08/09/2015 Document Reviewed: 04/07/2014 Elsevier Interactive Patient Education  2017 [employer].

## 2019-07-02 ENCOUNTER — Other Ambulatory Visit: Payer: Self-pay | Admitting: Family Medicine

## 2019-08-08 ENCOUNTER — Other Ambulatory Visit: Payer: Self-pay

## 2019-08-08 MED ORDER — ATORVASTATIN CALCIUM 20 MG PO TABS: 20.0000 mg | ORAL_TABLET | Freq: Every evening | ORAL | 1 refills | Status: DC

## 2019-08-17 ENCOUNTER — Telehealth: Payer: Self-pay | Admitting: Family Medicine

## 2019-08-17 ENCOUNTER — Other Ambulatory Visit: Payer: Self-pay

## 2019-08-17 DIAGNOSIS — R42 Dizziness and giddiness: Secondary | ICD-10-CM

## 2019-08-17 NOTE — Telephone Encounter (Signed)
Called and spoke with pt and advised her of the below. She wanted the referral placed and did not want to do the MRI until she had her visit with ENT.

## 2019-08-17 NOTE — Telephone Encounter (Signed)
I am okay with referral under dizziness.  Probably make sense to go ahead and repeat MRI-you may order using prior order.  Tell her I am also willing to have a visit with her before the ENT referral or MRI if she would like

## 2019-08-17 NOTE — Telephone Encounter (Signed)
From note on 04/24/19  #balance issues S: Patient has seen neurology May 24, 2018.  MRI of brain was ordered but that was right at the beginning of COVID-19 pandemic and did not appear was completed-prior MRI of the brain 2015 showed cerebellar infarctions from the past -still with vision issues - feels like balance is worse. Bumps into walls/door frames. No falls.   Ok to do referral or have come in?

## 2019-08-17 NOTE — Telephone Encounter (Signed)
Patient called in and wanted to see if she could have a referral placed for a ENT with [facility]. Patient stated that her balance is off and she thinks has to do with when she had shingles in her left ear.

## 2019-08-18 ENCOUNTER — Other Ambulatory Visit: Payer: Self-pay | Admitting: Family Medicine

## 2019-08-19 ENCOUNTER — Other Ambulatory Visit: Payer: Self-pay

## 2019-08-19 DIAGNOSIS — R42 Dizziness and giddiness: Secondary | ICD-10-CM

## 2019-09-02 ENCOUNTER — Encounter: Payer: Self-pay | Admitting: Family Medicine

## 2019-09-08 ENCOUNTER — Telehealth: Payer: Self-pay | Admitting: Family Medicine

## 2019-09-08 ENCOUNTER — Other Ambulatory Visit: Payer: Self-pay | Admitting: Family Medicine

## 2019-09-08 MED ORDER — ESCITALOPRAM OXALATE 10 MG PO TABS: 10.0000 mg | ORAL_TABLET | Freq: Every day | ORAL | 3 refills | Status: DC

## 2019-09-08 NOTE — Telephone Encounter (Signed)
Pt notified Rx sent to pharmacy as requested. 

## 2019-09-08 NOTE — Telephone Encounter (Signed)
MEDICATION: Lexapro 10 MG  PHARMACY: [facility] [address] [facility]  Comments:   **Let patient know to contact pharmacy at the end of the day to make sure medication is ready. **  ** Please notify patient to allow 48-72 hours to process**  **Encourage patient to contact the pharmacy for refills or they can request refills through [facility]**

## 2019-09-20 NOTE — Progress Notes (Signed)
Patient ID: Theresa [name], female   DOB: 01-09-1932, 86 y.o.   MRN: [id number]     86 y.o.  has a history of mitral regurgitation, status post mitral valve repair at [facility] in 2008, chronic atrial fibrillation , prior stroke in 12/12 at [facility] (felt to be embolic), on  Xarelto for anticoagulation    Last echo 03/22/13  EF normal 55% and intact MV repair with no residual MR    11/26/15 seen by primary for right leg pain ? DJD given steroids ABI's Normal  Seen by Ortho   Seen by primary 11/26/16 with some dysphagia especially with glucosamine tablets declined EGD    She has poor balance and vision from macular degeneration   No cardiac complaints   Husband seeing Dr Su Grand for leaky valve.   ROS: Denies fever, malais, weight loss, blurry vision, decreased visual acuity, cough, sputum, SOB, hemoptysis, pleuritic pain, palpitaitons, heartburn, abdominal pain, melena, lower extremity edema, claudication, or rash.  All other systems reviewed and negative  General: Affect appropriate Thin frail female  HEENT: normal Neck supple with no adenopathy JVP normal no bruits no thyromegaly Lungs clear with no wheezing and good diaphragmatic motion Heart:  S1/S2 no murmur, no rub, gallop or click PMI normal Abdomen: benighn, BS positve, no tenderness, no AAA no bruit.  No HSM or HJR Distal pulses intact with no bruits No edema Neuro non-focal Skin warm and dry No muscular weakness   Current Outpatient Medications  Medication Sig Dispense Refill  . Acetaminophen (TYLENOL ARTHRITIS PAIN PO) Take by mouth.    Marland Kitchen atorvastatin (LIPITOR) 20 MG tablet Take 1 tablet (20 mg total) by mouth every evening. 90 tablet 1  . Calcium Carbonate-Vitamin D (CALTRATE 600+D) 600-400 MG-UNIT per tablet Take 1 tablet by mouth 2 (two) times daily.      . Cholecalciferol (VITAMIN D) 2000 UNITS CAPS Take 2,000 Units by mouth daily.     . digoxin (LANOXIN) 0.125 MG tablet TAKE ONE-HALF  TABLET BY MOUTH DAILY 45 tablet 3  . escitalopram (LEXAPRO) 10 MG tablet Take 1 tablet (10 mg total) by mouth daily. 90 tablet 3  . famotidine (PEPCID) 20 MG tablet TAKE 1 TABLET BY MOUTH 2 TIMES DAILY. GENERIC EQUIVALENT FOR PEPCID 180 tablet 3  . glucosamine-chondroitin 500-400 MG tablet Take 1 tablet by mouth 2 (two) times daily.      Marland Kitchen levothyroxine (SYNTHROID) 75 MCG tablet One tab daily before breakfast 90 tablet 3  . metoprolol tartrate (LOPRESSOR) 100 MG tablet Take 1 tablet (100 mg total) by mouth 2 (two) times daily. 180 tablet 2  . Multiple Vitamins-Minerals (PRESERVISION AREDS 2 PO) Take 1 tablet by mouth 2 (two) times daily. Reported on 09/04/2015    . rivaroxaban (XARELTO) 20 MG TABS tablet TAKE 1 TABLET BY MOUTH DAILY WITH SUPPER 90 tablet 3  . vitamin A 10000 UNIT capsule Take 10,000 Units by mouth daily.    . VOLTAREN 1 % GEL Apply topically 4 (four) times daily.     No current facility-administered medications for this visit.    Allergies  Rofecoxib  Electrocardiogram:     02/02/18   afib rate 59 nonspecific ST changes   Assessment and Plan  MV Repair: SBE prophylaxis  No murmur on exam intact by echo 2015 will update   PAF: On xarelto good rate control Appears to have chronic afib   Thyroid:  Lab Results  Component Value Date   TSH 2.17 05/04/2019  Chol:    Lab Results  Component Value Date   LDLCALC 49 11/01/2018   Leg Pain: ? DJD ABI's were normal 12/05/15  no evidence of embolic event compliant with NOAC   Dysphagia:not interested in EGD cutting glucosamine tabs in half   Depression: controlled with Lexapro   Gait:  Seems neurologic and related to loss of visual cues with macular degeneration  Dig level low when last checked Has had prior cerebellar infarcts from afib MRI 09/21/19 no acute findings chronic left frontoparietal infarct no vascular disease Should have PT/OT for safety balance issues   F/U in a year    [employer]

## 2019-09-21 ENCOUNTER — Ambulatory Visit
Admission: RE | Admit: 2019-09-21 | Discharge: 2019-09-21 | Disposition: A | Discharge status: Home / Self Care | Payer: Medicare Other | Source: Ambulatory Visit | Attending: Family Medicine | Admitting: Family Medicine

## 2019-09-21 ENCOUNTER — Other Ambulatory Visit: Payer: Self-pay

## 2019-09-21 DIAGNOSIS — I639 Cerebral infarction, unspecified: Secondary | ICD-10-CM | POA: Diagnosis not present

## 2019-09-21 DIAGNOSIS — R42 Dizziness and giddiness: Secondary | ICD-10-CM

## 2019-09-23 ENCOUNTER — Encounter: Payer: Self-pay | Admitting: Cardiovascular Disease

## 2019-09-23 ENCOUNTER — Ambulatory Visit: Payer: Medicare Other | Admitting: Cardiovascular Disease

## 2019-09-23 ENCOUNTER — Other Ambulatory Visit: Payer: Self-pay

## 2019-09-23 VITALS — BP 120/64 | HR 53 | Ht 61.0 in | Wt 101.0 lb

## 2019-09-23 DIAGNOSIS — I482 Chronic atrial fibrillation, unspecified: Secondary | ICD-10-CM

## 2019-09-23 DIAGNOSIS — Z9889 Other specified postprocedural states: Secondary | ICD-10-CM

## 2019-09-23 NOTE — Patient Instructions (Addendum)

## 2019-09-26 ENCOUNTER — Ambulatory Visit (INDEPENDENT_AMBULATORY_CARE_PROVIDER_SITE_OTHER): Payer: Medicare Other | Admitting: Otolaryngology

## 2019-10-07 ENCOUNTER — Encounter (INDEPENDENT_AMBULATORY_CARE_PROVIDER_SITE_OTHER): Payer: Self-pay | Admitting: Otolaryngology

## 2019-10-07 ENCOUNTER — Ambulatory Visit (INDEPENDENT_AMBULATORY_CARE_PROVIDER_SITE_OTHER): Payer: Medicare Other | Admitting: Otolaryngology

## 2019-10-07 ENCOUNTER — Other Ambulatory Visit: Payer: Self-pay

## 2019-10-07 VITALS — Temp 97.2°F

## 2019-10-07 DIAGNOSIS — R42 Dizziness and giddiness: Secondary | ICD-10-CM | POA: Diagnosis not present

## 2019-10-07 DIAGNOSIS — H6123 Impacted cerumen, bilateral: Secondary | ICD-10-CM | POA: Diagnosis not present

## 2019-10-07 NOTE — Progress Notes (Addendum)
HPI: Theresa [name] is a 86 y.o. female who presents is referred by Dr. Yong Channel for evaluation of chronically being off balance for several years.  This is gradually gotten worse over the past years.  She had an MRI scan performed at [place] which showed an old stroke and poor microcirculation within the brain.  No evidence of neoplasm or other significant abnormality. Patient presently walks with a cane and complains of chronic dizziness and off-balance.  No true vertigo..  Past Medical History:  Diagnosis Date  . Atrial fibrillation (HCC)    AFIB  . Collagenous colitis   . CVA (cerebral vascular accident) ([place])    a.  L parietal CVA by MRI 13/24, likely embolic;  b.  TEE by report with neg bubble study;  c.  carotid dopplers  12/12:  + plaque, no sig ICA stenosis   . History of postmenopausal HRT   . Hx of adenomatous colonic polyps   . Hypothyroidism   . MVP (mitral valve prolapse)    a. s/p MV repair at [facility] in 2008;  b. Echocardiogram 7/12: EF 50%, status post mitral valve repair with mild MR and minimal MS, mean gradient 4, moderate LAE, LA diam 55 mm; mild RAE, PASP 28-32;   Marland Kitchen Osteoarthritis   . Osteoporosis    Past Surgical History:  Procedure Laterality Date  . CARDIOVERSION  01/09/2012   Procedure: CARDIOVERSION;  Surgeon: Jolaine Artist, MD;  Location: [facility] ENDOSCOPY;  Service: Cardiovascular;  Laterality: N/A;  . CATARACT EXTRACTION    . CHOLECYSTECTOMY    . DILATION AND CURETTAGE OF UTERUS    . LUMBAR FUSION    . MITRAL VALVE REPAIR  2008  . TONSILLECTOMY     Social History   Socioeconomic History  . Marital status: Married    Spouse name: Not on file  . Number of children: Not on file  . Years of education: Not on file  . Highest education level: Master's degree (e.g., MA, MS, MEng, MEd, MSW, MBA)  Occupational History  . Occupation: RETIRED    Employer: RETIRED    Comment: Engineer, mining  Tobacco Use  . Smoking status: Former Smoker    Quit  date: 04/28/1966    Years since quitting: 53.4  . Smokeless tobacco: Never Used  Vaping Use  . Vaping Use: Never used  Substance and Sexual Activity  . Alcohol use: Yes    Alcohol/week: 1.0 standard drink    Types: 1 Glasses of wine per week    Comment: 1 small glass with luch and dinner  . Drug use: No  . Sexual activity: Not Currently  Other Topics Concern  . Not on file  Social History Narrative   Married for 60 years-1st marriage for both. No kids. No pets.       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 Difficulty of Paying Living Expenses:   Food Insecurity:   . Worried About Charity fundraiser in the Last Year:   . Arboriculturist in the Last Year:   Transportation Needs:   . Film/video editor (Medical):   Marland Kitchen Lack of Transportation (Non-Medical):   Physical Activity:   . Days  of Exercise per Week:   . Minutes of Exercise per Session:   Stress:   . Feeling of Stress :   Social Connections:   . Frequency of Communication with Friends and Family:   . Frequency of Social Gatherings with Friends and Family:   . Attends Religious Services:   . Active Member of Clubs or Organizations:   . Attends Archivist Meetings:   Marland Kitchen Marital Status:    Family History  Problem Relation Age of Onset  . Cancer Father        COLON  . Stroke Neg Hx        1ST DEGREE RELATIVE <60   Allergies  Allergen Reactions  . Rofecoxib     REACTION: Elevated LFT's   Prior to Admission medications   Medication Sig Start Date End Date Taking? Authorizing Provider  Acetaminophen (TYLENOL ARTHRITIS PAIN PO) Take by mouth.   Yes [provider]  atorvastatin (LIPITOR) 20 MG tablet Take 1 tablet (20 mg total) by mouth every evening. 08/08/19  Yes  Marin Olp, MD  Calcium Carbonate-Vitamin D (CALTRATE 600+D) 600-400 MG-UNIT per tablet Take 1 tablet by mouth 2 (two) times daily.     Yes [provider]  Cholecalciferol (VITAMIN D) 2000 UNITS CAPS Take 2,000 Units by mouth daily.    Yes [provider]  digoxin (LANOXIN) 0.125 MG tablet TAKE ONE-HALF TABLET BY MOUTH DAILY 08/18/19  Yes Marin Olp, MD  escitalopram (LEXAPRO) 10 MG tablet Take 1 tablet (10 mg total) by mouth daily. 09/08/19  Yes Marin Olp, MD  famotidine (PEPCID) 20 MG tablet TAKE 1 TABLET BY MOUTH 2 TIMES DAILY. GENERIC EQUIVALENT FOR PEPCID 07/04/19  Yes Marin Olp, MD  glucosamine-chondroitin 500-400 MG tablet Take 1 tablet by mouth 2 (two) times daily.     Yes [provider]  levothyroxine (SYNTHROID) 75 MCG tablet One tab daily before breakfast 04/12/19  Yes Marin Olp, MD  metoprolol tartrate (LOPRESSOR) 100 MG tablet Take 1 tablet (100 mg total) by mouth 2 (two) times daily. 06/27/19  Yes Marin Olp, MD  Multiple Vitamins-Minerals (PRESERVISION AREDS 2 PO) Take 1 tablet by mouth 2 (two) times daily. Reported on 09/04/2015   Yes [provider]  rivaroxaban (XARELTO) 20 MG TABS tablet TAKE 1 TABLET BY MOUTH DAILY WITH SUPPER 05/10/19  Yes Marin Olp, MD  vitamin A 10000 UNIT capsule Take 10,000 Units by mouth daily.   Yes [provider]  VOLTAREN 1 % GEL Apply topically 4 (four) times daily.   Yes [provider]     Positive ROS: Otherwise negative  All other systems have been reviewed and were otherwise negative with the exception of those mentioned in the HPI and as above.  Physical Exam: Constitutional: Alert, well-appearing, no acute distress Ears: External ears without lesions or tenderness.  Both ear canals have a large amount of wax on both sides that was cleaned in the office using suction forceps and curettes.  TMs were clear bilaterally with good mobility on  pneumatic otoscopy and patient had good hearing in both ears.  On Dix-Hallpike testing no clinical evidence of BPPV. Nasal: External nose without lesions. Clear nasal passages Oral: Lips and gums without lesions. Tongue and palate mucosa without lesions. Posterior oropharynx clear.  Neck: No palpable adenopathy or masses Respiratory: Breathing comfortably  Skin: No facial/neck lesions or rash noted.  Cerumen impaction removal  Date/Time: 10/07/2019 5:43 PM Performed by: Lucia Gaskins,  Leonides Sake, MD Authorized by: Rozetta Nunnery, MD   Consent:    Consent obtained:  Verbal   Consent given by:  Patient   Risks discussed:  Pain and bleeding Procedure details:    Location:  L ear and R ear   Procedure type: curette, suction and forceps   Post-procedure details:    Inspection:  TM intact and canal normal   Hearing quality:  Improved   Patient tolerance of procedure:  Tolerated well, no immediate complications Comments:     TMs are clear bilaterally.    Assessment: Bilateral cerumen impactions Chronic dizziness with no evidence of significant inner ear abnormality.  Plan: For her balance I discussed with her that vestibular rehabilitation with physical therapy would be an option to help improve her balance.  There is no medication that will improve her balance and discussed with her concerning consideration of vestibular rehab.   Radene Journey, MD   CC:

## 2019-10-10 DIAGNOSIS — H34831 Tributary (branch) retinal vein occlusion, right eye, with macular edema: Secondary | ICD-10-CM | POA: Diagnosis not present

## 2019-10-10 DIAGNOSIS — H353122 Nonexudative age-related macular degeneration, left eye, intermediate dry stage: Secondary | ICD-10-CM | POA: Diagnosis not present

## 2019-10-10 DIAGNOSIS — H35363 Drusen (degenerative) of macula, bilateral: Secondary | ICD-10-CM | POA: Diagnosis not present

## 2019-10-10 DIAGNOSIS — H353111 Nonexudative age-related macular degeneration, right eye, early dry stage: Secondary | ICD-10-CM | POA: Diagnosis not present

## 2019-10-13 DIAGNOSIS — H34831 Tributary (branch) retinal vein occlusion, right eye, with macular edema: Secondary | ICD-10-CM | POA: Diagnosis not present

## 2019-10-13 DIAGNOSIS — H353122 Nonexudative age-related macular degeneration, left eye, intermediate dry stage: Secondary | ICD-10-CM | POA: Diagnosis not present

## 2019-10-13 DIAGNOSIS — H3561 Retinal hemorrhage, right eye: Secondary | ICD-10-CM | POA: Diagnosis not present

## 2019-10-13 DIAGNOSIS — H353111 Nonexudative age-related macular degeneration, right eye, early dry stage: Secondary | ICD-10-CM | POA: Diagnosis not present

## 2019-10-31 NOTE — Progress Notes (Signed)
Phone [phone]   Subjective:  Patient presents today for their annual physical. Chief complaint-noted.   See problem oriented charting- Review of Systems  Constitutional: Negative for chills and fever.  HENT: Negative for hearing loss and tinnitus.   Eyes: Negative for blurred vision and double vision.  Respiratory: Positive for shortness of breath (with hills). Negative for cough and wheezing.   Cardiovascular: Positive for leg swelling. Negative for chest pain and palpitations.       Right foot at times.   Gastrointestinal: Negative for heartburn, nausea and vomiting.  Genitourinary: Negative for dysuria, frequency and urgency.  Musculoskeletal: Negative for back pain, joint pain and neck pain.  Skin: Negative for rash.  Neurological: Negative for dizziness, seizures, weakness and headaches.  Endo/Heme/Allergies: Does not bruise/bleed easily.  Psychiatric/Behavioral: Negative for depression, memory loss and suicidal ideas. The patient does not have insomnia.     The following were reviewed and entered/updated in epic: Past Medical History:  Diagnosis Date  . Atrial fibrillation (HCC)    AFIB  . Collagenous colitis   . CVA (cerebral vascular accident) ([place])    a.  L parietal CVA by MRI 30/09, likely embolic;  b.  TEE by report with neg bubble study;  c.  carotid dopplers  12/12:  + plaque, no sig ICA stenosis   . History of postmenopausal HRT   . Hx of adenomatous colonic polyps   . Hypothyroidism   . MVP (mitral valve prolapse)    a. s/p MV repair at [facility] in 2008;  b. Echocardiogram 7/12: EF 50%, status post mitral valve repair with mild MR and minimal MS, mean gradient 4, moderate LAE, LA diam 55 mm; mild RAE, PASP 28-32;   Marland Kitchen Osteoarthritis   . Osteoporosis    Patient Active Problem List   Diagnosis Date Noted  . TIA (transient ischemic attack) 03/21/2013    Priority: High  . Hx of ischemic left MCA stroke 04/08/2011    Priority: High  . Atrial fibrillation ([place])  07/07/2007    Priority: High  . History of mitral valve repair 09/11/2006    Priority: High  . Hyperlipidemia 09/01/2014    Priority: Medium  . CKD (chronic kidney disease), stage III ([place]) 03/02/2014    Priority: Medium  . Major depression in full remission ([place]) 11/23/2013    Priority: Medium  . Hypertension 03/04/2012    Priority: Medium  . GASTROESOPHAGEAL REFLUX DISEASE, MILD 06/04/2009    Priority: Medium  . Hypothyroidism 11/23/2006    Priority: Medium  . Osteopenia 11/23/2006    Priority: Medium  . Osteoarthritis 09/11/2006    Priority: Medium  . Rosacea 11/23/2013    Priority: Low  . 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 PERSONAL HX COLONIC POLYPS 10/22/2009    Priority: Low  . CHANGE IN BOWELS 09/20/2009    Priority: Low  . Raynaud's syndrome 03/06/2009    Priority: Low  . SCOLIOSIS, LUMBAR SPINE 01/03/2009    Priority: Low  . Cerebellar ataxia in diseases classified elsewhere ([place]) 05/04/2019  . Balance disorder 05/03/2018  . Macular degeneration 03/31/2017  . Difficulty swallowing pills 11/26/2016   Past Surgical History:  Procedure Laterality Date  . CARDIOVERSION  01/09/2012   Procedure: CARDIOVERSION;  Surgeon: Jolaine Artist, MD;  Location: [facility] ENDOSCOPY;  Service: Cardiovascular;  Laterality: N/A;  . CATARACT EXTRACTION    . CHOLECYSTECTOMY    . DILATION AND CURETTAGE OF UTERUS    .  LUMBAR FUSION    . MITRAL VALVE REPAIR  2008  . TONSILLECTOMY      Family History  Problem Relation Age of Onset  . Cancer Father        COLON  . Stroke Neg Hx        1ST DEGREE RELATIVE <60    Medications- reviewed and updated Current Outpatient Medications  Medication Sig Dispense Refill  . Acetaminophen (TYLENOL ARTHRITIS PAIN PO) Take by mouth.    Marland Kitchen atorvastatin (LIPITOR) 20 MG tablet Take 1 tablet (20 mg total) by mouth every evening. 90 tablet 1  . Calcium Carbonate-Vitamin D (CALTRATE 600+D) 600-400  MG-UNIT per tablet Take 1 tablet by mouth 2 (two) times daily.      . Cholecalciferol (VITAMIN D) 2000 UNITS CAPS Take 2,000 Units by mouth daily.     . digoxin (LANOXIN) 0.125 MG tablet TAKE ONE-HALF TABLET BY MOUTH DAILY 45 tablet 3  . escitalopram (LEXAPRO) 10 MG tablet Take 1 tablet (10 mg total) by mouth daily. 90 tablet 3  . famotidine (PEPCID) 20 MG tablet TAKE 1 TABLET BY MOUTH 2 TIMES DAILY. GENERIC EQUIVALENT FOR PEPCID 180 tablet 3  . glucosamine-chondroitin 500-400 MG tablet Take 1 tablet by mouth 2 (two) times daily.      Marland Kitchen levothyroxine (SYNTHROID) 75 MCG tablet One tab daily before breakfast 90 tablet 3  . metoprolol tartrate (LOPRESSOR) 100 MG tablet Take 1 tablet (100 mg total) by mouth 2 (two) times daily. 180 tablet 2  . Multiple Vitamins-Minerals (PRESERVISION AREDS 2 PO) Take 1 tablet by mouth 2 (two) times daily. Reported on 09/04/2015    . rivaroxaban (XARELTO) 20 MG TABS tablet TAKE 1 TABLET BY MOUTH DAILY WITH SUPPER 90 tablet 3  . vitamin A 10000 UNIT capsule Take 10,000 Units by mouth daily.    . VOLTAREN 1 % GEL Apply topically 4 (four) times daily.     No current facility-administered medications for this visit.    Allergies-reviewed and updated Allergies  Allergen Reactions  . Rofecoxib     REACTION: Elevated LFT's    Social History   Social History Narrative   Married for 67 years-1st marriage for both. No kids. No pets.       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 Oropharynx normal Neck: no thyromegaly CV: RRR no murmurs rubs or  gallops Lungs: CTAB no crackles, wheeze, rhonchi Abdomen: soft/nontender/nondistended/normal bowel sounds. No rebound or guarding.  Ext: no edema Skin: warm, dry Neuro: grossly normal, moves all extremities, PERRLA, gait unstable- did well walking to table but missteps just before table and appeared off balance- somewhat broad based gait   Assessment and Plan   86 y.o. female presenting for annual physical.  Health Maintenance counseling: 1. Anticipatory guidance: Patient counseled regarding regular dental exams q6 months, eye exams frequently with Dr. Kathrin Penner and specialist Dr. Posey Pronto for macular degeneration,  avoiding smoking and second hand smoke , limiting  alcohol to 1 beverage per day- 2-3 oz of wine daily.   2. Risk factor reduction:  Advised patient of need for regular exercise and diet rich and fruits and vegetables to reduce risk of heart attack and stroke. Exercise- not exercising due to balance issues- recommended considering PT to find home exercises. Diet-trying to supplement calories- doing ice cream and premier protein shakes.  Wt Readings from Last 3 Encounters:  11/02/19 100 lb (45.4 kg)  09/23/19 101 lb (45.8 kg)  05/04/19 105 lb 9.6 oz (47.9 kg)  3. Immunizations/screenings/ancillary studies-advised flu shot in the fall-otherwise up-to-date  Immunization History  Administered Date(s) Administered  . Fluad Quad(high Dose 65+) 11/30/2018  . Influenza Split 12/17/2010, 12/02/2011  . Influenza Whole 03/17/2001, 12/18/2006, 12/28/2007, 01/03/2009, 12/26/2009  . Influenza, High Dose Seasonal PF 12/22/2012, 03/06/2015, 11/26/2015, 12/09/2017  . Influenza,inj,Quad PF,6+ Mos 11/23/2013  . Influenza-Unspecified 11/20/2016  . PFIZER SARS-COV-2 Vaccination 06/09/2019, 06/30/2019  . Pneumococcal Conjugate-13 03/02/2014  . Pneumococcal Polysaccharide-23 03/17/2000, 09/16/2006  . Td 03/18/1995, 09/15/2006  . Tdap 12/17/2010  . Zoster Recombinat (Shingrix) 09/18/2016, 11/20/2016   4. Cervical cancer screening-  no longer needed as past age based screenings.  No blood or discharge vaginally 5. Breast cancer screening- 03/2019 and opts to continue due to dense breasts and higher risk 6. Colon cancer screening -  No longer needed in age range . No blood in stool  7. Skin cancer screening-followed with Dr. Ledell Peoples office in past- has not been recently.advised regular sunscreen use. Denies worrisome, changing, or new skin lesions.  8. Birth control/STD check-monogamous/postmenopausal 9. Osteoporosis screening at 65-see osteopenia discussion below -Former smoker-no regular screening required is quit in 1960s  Status of chronic or acute concerns   #Atrial fibrillation S: On Xarelto 20 mg for anticoagulation.  On digoxin.  On metoprolol 100 mg twice a day for rate control. Follow with Dr. Johnsie Cancel A/P: Appropriately anticoagulated and rate controlled-continue current medication  #balance issues S: Have increased recently. Uses cain when out. Has walker but does not use due to size of doors in home.  Cardiology has reached out to me and requested gait and balance training.  ENT has recommended vestibular rehabilitation.  Prior stroke likely contributes A/P: strongly recommended PT as per Dr. Johnsie Cancel or vestibular rehab as per ENT. She declines both for now. She doesn't drive and hard to get to appts- offered home health PT and she declines for now but will consider   #Hypertension S: Controlled on metoprolol alone.  BP Readings from Last 3 Encounters:  11/02/19 118/62  09/23/19 120/64  05/04/19 130/72   A/P: Stable. Continue current medications.     #Hypothyroidism S: Compliant with levothyroxine 75 mcg Lab Results  Component Value Date   TSH 2.17 05/04/2019  A/P: Hopefully controlled-update TSH today   #CKD stage III #osteoarthritis hands/elbows. voltaren failure S: Compliant with blood pressure medications.  Sparing etodolac in the past-have discouraged this with  CKD and Xarelto use. She is off. Can use Voltaren gel A/P: Hopefully chronic kidney disease is stable-update labs today.  Thankful she is off of etodolac-Voltaren is being used which is a safer alternative.  She may also try Aspercreme   #Hyperlipidemia S: Compliant with atorvastatin 20 mg Lab Results  Component Value Date   CHOL 133 11/01/2018   HDL 61.40 11/01/2018   LDLCALC 49 11/01/2018   LDLDIRECT 47.0 09/04/2015   TRIG 111.0 11/01/2018   CHOLHDL 2 11/01/2018  A/P: Excellent control last year-update lipid panel today.  Likely  continue current medicines  # Depression S: compliant with lexapro 10mg  Depression screen [facility] 2/9 11/02/2019 06/27/2019 05/04/2019  Decreased Interest 0 0 0  Down, Depressed, Hopeless 0 0 0  PHQ - 2 Score 0 0 0  Altered sleeping 0 - 0  Tired, decreased energy 0 - 0  Change in appetite 0 - 0  Feeling bad or failure about yourself  0 - 0  Trouble concentrating 0 - 0  Moving slowly or fidgety/restless 0 - 0  Suicidal thoughts 0 - 0  PHQ-9 Score 0 - 0  Difficult doing work/chores Not difficult at all - Not difficult at all  Some recent data might be hidden  A/P: Full remission-continue current medication  #GERD-on famotidine 20 mg twice daily-control symptoms well   #Osteopenia-January 2019 bone density largely stable.  Likely repeat early 2021.  She takes calcium and vitamin D and wants hold off on medicine unless worsening numbers   #History of mitral valve repair-[facility] at [facility].  Follows with cardiology here locally  Recommended follow up: 68-month follow-up or sooner if needed Future Appointments  Date Time Provider [place]  12/23/2019  9:45 AM Gardiner Barefoot, DPM [facility]   Lab/Order associations: not fasting   ICD-10-CM   1. Preventative health care  Z00.00 CBC with Differential/Platelet    Comprehensive metabolic panel    Lipid panel    TSH  2. Essential hypertension  I10   3. Hypothyroidism due to acquired atrophy  of thyroid  E03.4 TSH  4. Stage 3 chronic kidney disease, unspecified whether stage 3a or 3b CKD  N18.30   5. Hyperlipidemia, unspecified hyperlipidemia type  E78.5 CBC with Differential/Platelet    Comprehensive metabolic panel    Lipid panel  6. Recurrent major depressive disorder, in full remission ([place])  F33.42     No orders of the defined types were placed in this encounter.   Return precautions advised.  Garret Reddish, MD

## 2019-10-31 NOTE — Patient Instructions (Addendum)
Please stop by lab before you go If you have mychart- we will send your results within 3 business days of [place] receiving them.  If you do not have mychart- we will call you about results within 5 business days of [place] receiving them.  *please note we are currently using [facility] which has a longer processing time than [place] typically so labs may not come back as quickly as in the past *please also note that you will see labs on mychart as soon as they post. I will later go in and write notes on them- will say "notes from Dr. Yong Channel"  *Consider either physical therapy in office or home health physical therapy- you wanted to talk to Mikki Santee before doing this. I would need to enter that referral within 2-3 weeks for home health- we can refer ot in office physical therapy at any time  Health Maintenance Due  Topic Date Due  . INFLUENZA VACCINE -- - will complete later in flu season (please let us know if you get this at another location so we can update your chart) . We should have vaccination here in 1-2 months - can call back for an appointment.   10/16/2019

## 2019-11-02 ENCOUNTER — Other Ambulatory Visit: Payer: Self-pay

## 2019-11-02 ENCOUNTER — Ambulatory Visit (INDEPENDENT_AMBULATORY_CARE_PROVIDER_SITE_OTHER): Payer: Medicare Other | Admitting: Family Medicine

## 2019-11-02 ENCOUNTER — Encounter: Payer: Self-pay | Admitting: Family Medicine

## 2019-11-02 VITALS — BP 118/62 | HR 60 | Temp 98.6°F | Ht 61.0 in | Wt 100.0 lb

## 2019-11-02 DIAGNOSIS — N183 Chronic kidney disease, stage 3 unspecified: Secondary | ICD-10-CM

## 2019-11-02 DIAGNOSIS — E785 Hyperlipidemia, unspecified: Secondary | ICD-10-CM

## 2019-11-02 DIAGNOSIS — M19042 Primary osteoarthritis, left hand: Secondary | ICD-10-CM

## 2019-11-02 DIAGNOSIS — K219 Gastro-esophageal reflux disease without esophagitis: Secondary | ICD-10-CM

## 2019-11-02 DIAGNOSIS — E034 Atrophy of thyroid (acquired): Secondary | ICD-10-CM

## 2019-11-02 DIAGNOSIS — R2689 Other abnormalities of gait and mobility: Secondary | ICD-10-CM

## 2019-11-02 DIAGNOSIS — F3342 Major depressive disorder, recurrent, in full remission: Secondary | ICD-10-CM

## 2019-11-02 DIAGNOSIS — Z0000 Encounter for general adult medical examination without abnormal findings: Secondary | ICD-10-CM | POA: Diagnosis not present

## 2019-11-02 DIAGNOSIS — I10 Essential (primary) hypertension: Secondary | ICD-10-CM | POA: Diagnosis not present

## 2019-11-02 DIAGNOSIS — M19041 Primary osteoarthritis, right hand: Secondary | ICD-10-CM

## 2019-11-02 DIAGNOSIS — I4811 Longstanding persistent atrial fibrillation: Secondary | ICD-10-CM

## 2019-11-02 DIAGNOSIS — M8580 Other specified disorders of bone density and structure, unspecified site: Secondary | ICD-10-CM

## 2019-11-02 LAB — COMPREHENSIVE METABOLIC PANEL
AG Ratio: 2.2 (calc) (ref 1.0–2.5)
ALT: 23 U/L (ref 6–29)
AST: 21 U/L (ref 10–35)
Albumin: 4.6 g/dL (ref 3.6–5.1)
Alkaline phosphatase (APISO): 78 U/L (ref 37–153)
BUN/Creatinine Ratio: 37 (calc) — ABNORMAL HIGH (ref 6–22)
BUN: 40 mg/dL — ABNORMAL HIGH (ref 7–25)
CO2: 29 mmol/L (ref 20–32)
Calcium: 10 mg/dL (ref 8.6–10.4)
Chloride: 102 mmol/L (ref 98–110)
Creat: 1.09 mg/dL — ABNORMAL HIGH (ref 0.60–0.88)
Globulin: 2.1 g/dL (calc) (ref 1.9–3.7)
Glucose, Bld: 91 mg/dL (ref 65–99)
Potassium: 4.9 mmol/L (ref 3.5–5.3)
Sodium: 138 mmol/L (ref 135–146)
Total Bilirubin: 0.5 mg/dL (ref 0.2–1.2)
Total Protein: 6.7 g/dL (ref 6.1–8.1)

## 2019-11-02 LAB — CBC WITH DIFFERENTIAL/PLATELET
Absolute Monocytes: 651 cells/uL (ref 200–950)
Basophils Absolute: 111 cells/uL (ref 0–200)
Basophils Relative: 1.5 %
Eosinophils Absolute: 252 cells/uL (ref 15–500)
Eosinophils Relative: 3.4 %
HCT: 41.2 % (ref 35.0–45.0)
Hemoglobin: 13.5 g/dL (ref 11.7–15.5)
Lymphs Abs: 1857 cells/uL (ref 850–3900)
MCH: 32.4 pg (ref 27.0–33.0)
MCHC: 32.8 g/dL (ref 32.0–36.0)
MCV: 98.8 fL (ref 80.0–100.0)
MPV: 11.3 fL (ref 7.5–12.5)
Monocytes Relative: 8.8 %
Neutro Abs: 4529 cells/uL (ref 1500–7800)
Neutrophils Relative %: 61.2 %
Platelets: 201 10*3/uL (ref 140–400)
RBC: 4.17 10*6/uL (ref 3.80–5.10)
RDW: 11.6 % (ref 11.0–15.0)
Total Lymphocyte: 25.1 %
WBC: 7.4 10*3/uL (ref 3.8–10.8)

## 2019-11-02 LAB — LIPID PANEL
Cholesterol: 147 mg/dL (ref <200)
HDL: 77 mg/dL (ref >=50)
LDL Cholesterol (Calc): 53 mg/dL (calc)
Non-HDL Cholesterol (Calc): 70 mg/dL (calc) (ref <130)
Total CHOL/HDL Ratio: 1.9 (calc) (ref <5.0)
Triglycerides: 85 mg/dL (ref <150)

## 2019-11-02 LAB — TSH: TSH: 1.91 mIU/L (ref 0.40–4.50)

## 2019-11-02 NOTE — Addendum Note (Signed)
Addended by: Liliane Channel on: 11/02/2019 11:20 AM   Modules accepted: Orders

## 2019-11-02 NOTE — Telephone Encounter (Signed)
Ok to send referral. Sent my chart to patient to let know you are out of the office for this week.

## 2019-11-07 ENCOUNTER — Other Ambulatory Visit: Payer: Self-pay

## 2019-11-07 DIAGNOSIS — R42 Dizziness and giddiness: Secondary | ICD-10-CM

## 2019-11-07 DIAGNOSIS — R2689 Other abnormalities of gait and mobility: Secondary | ICD-10-CM

## 2019-11-11 DIAGNOSIS — I341 Nonrheumatic mitral (valve) prolapse: Secondary | ICD-10-CM | POA: Diagnosis not present

## 2019-11-11 DIAGNOSIS — M19021 Primary osteoarthritis, right elbow: Secondary | ICD-10-CM | POA: Diagnosis not present

## 2019-11-11 DIAGNOSIS — M8580 Other specified disorders of bone density and structure, unspecified site: Secondary | ICD-10-CM | POA: Diagnosis not present

## 2019-11-11 DIAGNOSIS — H3530 Unspecified macular degeneration: Secondary | ICD-10-CM | POA: Diagnosis not present

## 2019-11-11 DIAGNOSIS — I129 Hypertensive chronic kidney disease with stage 1 through stage 4 chronic kidney disease, or unspecified chronic kidney disease: Secondary | ICD-10-CM | POA: Diagnosis not present

## 2019-11-11 DIAGNOSIS — M19042 Primary osteoarthritis, left hand: Secondary | ICD-10-CM | POA: Diagnosis not present

## 2019-11-11 DIAGNOSIS — I4891 Unspecified atrial fibrillation: Secondary | ICD-10-CM | POA: Diagnosis not present

## 2019-11-11 DIAGNOSIS — N183 Chronic kidney disease, stage 3 unspecified: Secondary | ICD-10-CM | POA: Diagnosis not present

## 2019-11-11 DIAGNOSIS — M4186 Other forms of scoliosis, lumbar region: Secondary | ICD-10-CM | POA: Diagnosis not present

## 2019-11-11 DIAGNOSIS — M19022 Primary osteoarthritis, left elbow: Secondary | ICD-10-CM | POA: Diagnosis not present

## 2019-11-11 DIAGNOSIS — M810 Age-related osteoporosis without current pathological fracture: Secondary | ICD-10-CM | POA: Diagnosis not present

## 2019-11-11 DIAGNOSIS — M19041 Primary osteoarthritis, right hand: Secondary | ICD-10-CM | POA: Diagnosis not present

## 2019-11-11 DIAGNOSIS — E785 Hyperlipidemia, unspecified: Secondary | ICD-10-CM | POA: Diagnosis not present

## 2019-11-11 DIAGNOSIS — E034 Atrophy of thyroid (acquired): Secondary | ICD-10-CM | POA: Diagnosis not present

## 2019-11-11 DIAGNOSIS — I69322 Dysarthria following cerebral infarction: Secondary | ICD-10-CM | POA: Diagnosis not present

## 2019-11-11 DIAGNOSIS — I7300 Raynaud's syndrome without gangrene: Secondary | ICD-10-CM | POA: Diagnosis not present

## 2019-11-17 DIAGNOSIS — H3530 Unspecified macular degeneration: Secondary | ICD-10-CM

## 2019-11-17 DIAGNOSIS — M19041 Primary osteoarthritis, right hand: Secondary | ICD-10-CM

## 2019-11-17 DIAGNOSIS — I69322 Dysarthria following cerebral infarction: Secondary | ICD-10-CM

## 2019-11-17 DIAGNOSIS — Z7901 Long term (current) use of anticoagulants: Secondary | ICD-10-CM

## 2019-11-17 DIAGNOSIS — E034 Atrophy of thyroid (acquired): Secondary | ICD-10-CM

## 2019-11-17 DIAGNOSIS — M4186 Other forms of scoliosis, lumbar region: Secondary | ICD-10-CM | POA: Diagnosis not present

## 2019-11-17 DIAGNOSIS — Z9181 History of falling: Secondary | ICD-10-CM

## 2019-11-17 DIAGNOSIS — Z8673 Personal history of transient ischemic attack (TIA), and cerebral infarction without residual deficits: Secondary | ICD-10-CM

## 2019-11-17 DIAGNOSIS — N183 Chronic kidney disease, stage 3 unspecified: Secondary | ICD-10-CM | POA: Diagnosis not present

## 2019-11-17 DIAGNOSIS — I7300 Raynaud's syndrome without gangrene: Secondary | ICD-10-CM

## 2019-11-17 DIAGNOSIS — Z87891 Personal history of nicotine dependence: Secondary | ICD-10-CM

## 2019-11-17 DIAGNOSIS — M19021 Primary osteoarthritis, right elbow: Secondary | ICD-10-CM

## 2019-11-17 DIAGNOSIS — M810 Age-related osteoporosis without current pathological fracture: Secondary | ICD-10-CM

## 2019-11-17 DIAGNOSIS — Z86010 Personal history of colonic polyps: Secondary | ICD-10-CM

## 2019-11-17 DIAGNOSIS — R42 Dizziness and giddiness: Secondary | ICD-10-CM

## 2019-11-17 DIAGNOSIS — F3342 Major depressive disorder, recurrent, in full remission: Secondary | ICD-10-CM

## 2019-11-17 DIAGNOSIS — E785 Hyperlipidemia, unspecified: Secondary | ICD-10-CM

## 2019-11-17 DIAGNOSIS — R32 Unspecified urinary incontinence: Secondary | ICD-10-CM

## 2019-11-17 DIAGNOSIS — I4891 Unspecified atrial fibrillation: Secondary | ICD-10-CM | POA: Diagnosis not present

## 2019-11-17 DIAGNOSIS — M19042 Primary osteoarthritis, left hand: Secondary | ICD-10-CM

## 2019-11-17 DIAGNOSIS — I129 Hypertensive chronic kidney disease with stage 1 through stage 4 chronic kidney disease, or unspecified chronic kidney disease: Secondary | ICD-10-CM | POA: Diagnosis not present

## 2019-11-17 DIAGNOSIS — M8580 Other specified disorders of bone density and structure, unspecified site: Secondary | ICD-10-CM

## 2019-11-17 DIAGNOSIS — I341 Nonrheumatic mitral (valve) prolapse: Secondary | ICD-10-CM

## 2019-11-17 DIAGNOSIS — M19022 Primary osteoarthritis, left elbow: Secondary | ICD-10-CM

## 2019-11-17 DIAGNOSIS — K219 Gastro-esophageal reflux disease without esophagitis: Secondary | ICD-10-CM

## 2019-11-23 ENCOUNTER — Other Ambulatory Visit: Payer: Self-pay

## 2019-11-23 ENCOUNTER — Encounter: Payer: Self-pay | Admitting: Family Medicine

## 2019-11-24 ENCOUNTER — Other Ambulatory Visit: Payer: Self-pay

## 2019-11-24 MED ORDER — ROSUVASTATIN CALCIUM 20 MG PO TABS: 20.0000 mg | ORAL_TABLET | Freq: Every day | ORAL | 3 refills | Status: DC

## 2019-12-13 ENCOUNTER — Encounter: Payer: Self-pay | Admitting: Family Medicine

## 2019-12-23 ENCOUNTER — Ambulatory Visit: Payer: Medicare Other | Admitting: Podiatry

## 2019-12-23 ENCOUNTER — Encounter: Payer: Self-pay | Admitting: Podiatry

## 2019-12-23 ENCOUNTER — Other Ambulatory Visit: Payer: Self-pay

## 2019-12-23 DIAGNOSIS — N183 Chronic kidney disease, stage 3 unspecified: Secondary | ICD-10-CM | POA: Diagnosis not present

## 2019-12-23 DIAGNOSIS — B351 Tinea unguium: Secondary | ICD-10-CM | POA: Diagnosis not present

## 2019-12-23 DIAGNOSIS — M79675 Pain in left toe(s): Secondary | ICD-10-CM | POA: Diagnosis not present

## 2019-12-23 DIAGNOSIS — M79674 Pain in right toe(s): Secondary | ICD-10-CM

## 2019-12-23 NOTE — Progress Notes (Addendum)
This patient returns to my office for at risk foot care.  This patient requires this care by a professional since this patient will be at risk due to having chronic kidney disease. This patient is unable to cut nails himself since the patient cannot reach his nails.These nails are painful walking and wearing shoes.  This patient presents for at risk foot care today.  General Appearance  Alert, conversant and in no acute stress.  Vascular  Dorsalis pedis and posterior tibial  pulses are absent   bilaterally.  Capillary return is within normal limits  bilaterally. Temperature is within normal limits  bilaterally.  Neurologic  Senn-Weinstein monofilament wire test within normal limits  Left.  LOPS right  WNL> Muscle power within normal limits bilaterally.  Nails Thick disfigured discolored nails with subungual debris hallux nails  B/l. No evidence of bacterial infection or drainage bilaterally.  Orthopedic  No limitations of motion  feet .  No crepitus or effusions noted.  No bony pathology or digital deformities noted.  HAV  B/L.  Skin  normotropic skin with no porokeratosis noted bilaterally.  No signs of infections or ulcers noted.     Onychomycosis  Pain in right toes  Pain in left toes  Consent was obtained for treatment procedures.   Mechanical debridement of nails   bilaterally performed with a nail nipper.  Filed with dremel without incident.    Return office visit    3 months                 Told patient to return for periodic foot care and evaluation due to potential at risk complications.   Gardiner Barefoot DPM

## 2020-01-02 ENCOUNTER — Other Ambulatory Visit: Payer: Self-pay

## 2020-01-02 ENCOUNTER — Encounter: Payer: Self-pay | Admitting: Family Medicine

## 2020-01-02 DIAGNOSIS — D229 Melanocytic nevi, unspecified: Secondary | ICD-10-CM

## 2020-01-17 ENCOUNTER — Other Ambulatory Visit: Payer: Self-pay

## 2020-01-17 ENCOUNTER — Ambulatory Visit (INDEPENDENT_AMBULATORY_CARE_PROVIDER_SITE_OTHER): Payer: Medicare Other

## 2020-01-17 ENCOUNTER — Encounter: Payer: Self-pay | Admitting: Family Medicine

## 2020-01-17 DIAGNOSIS — Z23 Encounter for immunization: Secondary | ICD-10-CM | POA: Diagnosis not present

## 2020-01-19 DIAGNOSIS — H35011 Changes in retinal vascular appearance, right eye: Secondary | ICD-10-CM | POA: Diagnosis not present

## 2020-01-19 DIAGNOSIS — H34831 Tributary (branch) retinal vein occlusion, right eye, with macular edema: Secondary | ICD-10-CM | POA: Diagnosis not present

## 2020-01-19 DIAGNOSIS — H3561 Retinal hemorrhage, right eye: Secondary | ICD-10-CM | POA: Diagnosis not present

## 2020-01-19 DIAGNOSIS — H3581 Retinal edema: Secondary | ICD-10-CM | POA: Diagnosis not present

## 2020-02-13 DIAGNOSIS — H34831 Tributary (branch) retinal vein occlusion, right eye, with macular edema: Secondary | ICD-10-CM | POA: Diagnosis not present

## 2020-02-13 DIAGNOSIS — H353111 Nonexudative age-related macular degeneration, right eye, early dry stage: Secondary | ICD-10-CM | POA: Diagnosis not present

## 2020-02-13 DIAGNOSIS — H3561 Retinal hemorrhage, right eye: Secondary | ICD-10-CM | POA: Diagnosis not present

## 2020-02-13 DIAGNOSIS — H353122 Nonexudative age-related macular degeneration, left eye, intermediate dry stage: Secondary | ICD-10-CM | POA: Diagnosis not present

## 2020-02-20 DIAGNOSIS — H34831 Tributary (branch) retinal vein occlusion, right eye, with macular edema: Secondary | ICD-10-CM | POA: Diagnosis not present

## 2020-03-28 ENCOUNTER — Telehealth: Payer: Self-pay

## 2020-03-28 MED ORDER — RIVAROXABAN 20 MG PO TABS
ORAL_TABLET | ORAL | 3 refills | Status: DC
Start: 2020-03-28 — End: 2020-05-28

## 2020-03-28 NOTE — Telephone Encounter (Signed)
Medication has been sent to the patient's pharmacy listed below.

## 2020-03-28 NOTE — Telephone Encounter (Signed)
  LAST APPOINTMENT DATE: 11/02/2019   NEXT APPOINTMENT DATE:@2 /21/2022  MEDICATION:rivaroxaban (XARELTO) 20 MG TABS tablet   PHARMACY: [facility] [place], [place] - [address] AT [place] is completely out   Please advise

## 2020-04-10 DIAGNOSIS — H353122 Nonexudative age-related macular degeneration, left eye, intermediate dry stage: Secondary | ICD-10-CM | POA: Diagnosis not present

## 2020-04-10 DIAGNOSIS — H3561 Retinal hemorrhage, right eye: Secondary | ICD-10-CM | POA: Diagnosis not present

## 2020-04-10 DIAGNOSIS — H35363 Drusen (degenerative) of macula, bilateral: Secondary | ICD-10-CM | POA: Diagnosis not present

## 2020-04-10 DIAGNOSIS — H353111 Nonexudative age-related macular degeneration, right eye, early dry stage: Secondary | ICD-10-CM | POA: Diagnosis not present

## 2020-04-11 DIAGNOSIS — Z1231 Encounter for screening mammogram for malignant neoplasm of breast: Secondary | ICD-10-CM | POA: Diagnosis not present

## 2020-04-11 LAB — HM MAMMOGRAPHY

## 2020-04-12 ENCOUNTER — Encounter: Payer: Self-pay | Admitting: Family Medicine

## 2020-04-23 ENCOUNTER — Telehealth: Payer: Self-pay

## 2020-04-23 MED ORDER — LEVOTHYROXINE SODIUM 75 MCG PO TABS: ORAL_TABLET | ORAL | 3 refills | Status: DC

## 2020-04-23 MED ORDER — METOPROLOL TARTRATE 100 MG PO TABS: 100.0000 mg | ORAL_TABLET | Freq: Two times a day (BID) | ORAL | 2 refills | Status: DC

## 2020-04-23 NOTE — Telephone Encounter (Signed)
  LAST APPOINTMENT DATE: 10/2019   NEXT APPOINTMENT DATE:@2 /21/2022  MEDICATION:metoprolol tartrate (LOPRESSOR) 100 MG tablet //  levothyroxine (SYNTHROID) 75 MCG tablet  PHARMACY: [facility] - [place], [place] - [address] AT [address]  Comments: Patient is calling in stating [facility] is awaiting for approval to send these in, patient will be out at the end of week.    Please advise

## 2020-04-23 NOTE — Telephone Encounter (Signed)
[facility]

## 2020-04-24 DIAGNOSIS — H3561 Retinal hemorrhage, right eye: Secondary | ICD-10-CM | POA: Diagnosis not present

## 2020-04-24 DIAGNOSIS — H34831 Tributary (branch) retinal vein occlusion, right eye, with macular edema: Secondary | ICD-10-CM | POA: Diagnosis not present

## 2020-04-24 DIAGNOSIS — H35363 Drusen (degenerative) of macula, bilateral: Secondary | ICD-10-CM | POA: Diagnosis not present

## 2020-04-24 DIAGNOSIS — H353132 Nonexudative age-related macular degeneration, bilateral, intermediate dry stage: Secondary | ICD-10-CM | POA: Diagnosis not present

## 2020-04-25 ENCOUNTER — Ambulatory Visit: Payer: Medicare Other | Admitting: Podiatry

## 2020-04-30 DIAGNOSIS — H34831 Tributary (branch) retinal vein occlusion, right eye, with macular edema: Secondary | ICD-10-CM | POA: Diagnosis not present

## 2020-05-06 NOTE — Progress Notes (Signed)
Phone [phone] In person visit   Subjective:   Theresa [name] is a 87 y.o. year old very pleasant female patient who presents for/with See problem oriented charting Chief Complaint  Patient presents with  . Nail Problem    Left foot big toe, has used OTC nail fungus ointment with no relief  . Hypothyroidism  . Hypertension  . Hyperlipidemia  . Depression   This visit occurred during the SARS-CoV-2 public health emergency.  Safety protocols were in place, including screening questions prior to the visit, additional usage of staff PPE, and extensive cleaning of exam room while observing appropriate contact time as indicated for disinfecting solutions.   Past Medical History-  Patient Active Problem List   Diagnosis Date Noted  . Balance disorder 05/03/2018    Priority: High  . Macular degeneration 03/31/2017    Priority: High  . TIA (transient ischemic attack) 03/21/2013    Priority: High  . Hx of ischemic left MCA stroke 04/08/2011    Priority: High  . Atrial fibrillation ([place]) 07/07/2007    Priority: High  . History of mitral valve repair 09/11/2006    Priority: High  . Hyperlipidemia 09/01/2014    Priority: Medium  . CKD (chronic kidney disease), stage III ([place]) 03/02/2014    Priority: Medium  . Major depression in full remission ([place]) 11/23/2013    Priority: Medium  . Hypertension 03/04/2012    Priority: Medium  . GASTROESOPHAGEAL REFLUX DISEASE, MILD 06/04/2009    Priority: Medium  . Hypothyroidism 11/23/2006    Priority: Medium  . Osteopenia 11/23/2006    Priority: Medium  . Osteoarthritis 09/11/2006    Priority: Medium  . Rosacea 11/23/2013    Priority: Low  . 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 PERSONAL HX COLONIC POLYPS 10/22/2009    Priority: Low  . CHANGE IN BOWELS 09/20/2009    Priority: Low  . Raynaud's syndrome 03/06/2009    Priority: Low  . SCOLIOSIS, LUMBAR SPINE 01/03/2009    Priority:  Low  . Pain due to onychomycosis of toenails of both feet 12/23/2019  . Difficulty swallowing pills 11/26/2016    Medications- reviewed and updated Current Outpatient Medications  Medication Sig Dispense Refill  . Acetaminophen (TYLENOL ARTHRITIS PAIN PO) Take by mouth.    Marland Kitchen atorvastatin (LIPITOR) 20 MG tablet Take 1 tablet (20 mg total) by mouth every evening. 90 tablet 1  . Calcium Carbonate-Vitamin D 600-400 MG-UNIT tablet Take 1 tablet by mouth 2 (two) times daily.    . Cholecalciferol (VITAMIN D) 2000 UNITS CAPS Take 2,000 Units by mouth daily.    . digoxin (LANOXIN) 0.125 MG tablet TAKE ONE-HALF TABLET BY MOUTH DAILY 45 tablet 3  . escitalopram (LEXAPRO) 10 MG tablet Take 1 tablet (10 mg total) by mouth daily. 90 tablet 3  . famotidine (PEPCID) 20 MG tablet TAKE 1 TABLET BY MOUTH 2 TIMES DAILY. GENERIC EQUIVALENT FOR PEPCID 180 tablet 3  . glucosamine-chondroitin 500-400 MG tablet Take 1 tablet by mouth 2 (two) times daily.    Marland Kitchen levothyroxine (SYNTHROID) 75 MCG tablet One tab daily before breakfast 90 tablet 3  . metoprolol tartrate (LOPRESSOR) 100 MG tablet Take 1 tablet (100 mg total) by mouth 2 (two) times daily. 180 tablet 2  . Multiple Vitamins-Minerals (PRESERVISION AREDS 2 PO) Take 1 tablet by mouth 2 (two) times daily. Reported on 09/04/2015    . rivaroxaban (XARELTO) 20 MG TABS tablet TAKE 1 TABLET BY MOUTH  DAILY WITH SUPPER 90 tablet 3  . rosuvastatin (CRESTOR) 20 MG tablet Take 1 tablet (20 mg total) by mouth daily. 90 tablet 3  . vitamin A 10000 UNIT capsule Take 10,000 Units by mouth daily.    . VOLTAREN 1 % GEL Apply topically 4 (four) times daily.     No current facility-administered medications for this visit.     Objective:  BP 131/62   Pulse 60   Temp 97.7 F (36.5 C) (Temporal)   Resp 17   Ht 5\' 1"  (1.549 m)   Wt 102 lb 9.6 oz (46.5 kg)   LMP  (LMP Unknown)   SpO2 98%   BMI 19.39 kg/m  Gen: NAD, resting comfortably CV: irregularly irregular no murmurs  rubs or gallops Lungs: CTAB no crackles, wheeze, rhonchi Ext: no edema Skin: warm, dry Neuro: walks with cane    Assessment and Plan   #Atrial fibrillation S: On Xarelto 20mg  for anticoagulation.  On digoxin.  On metoprolol 100 mg twice a day for rate control. Follows with Dr. Johnsie Cancel- also on digoxin A/P: Stable. Continue current medications.    #balance issues S: from Dr. Johnsie Cancel "Would benefit from PT/OT referral for balance . For her balance I discussed with her that vestibular rehabilitation with physical therapy would be an option to help improve her balance.  There is no medication that will improve her balance and discussed with her concerning consideration of vestibular rehab."  Issues had increased at CPE 11/02/19  Despite using cane. sturggled with walker around home as rather tight. She declined PT and vestibular rehab last viist- later reached back out and this was ordered- she reports had some improvement in balance but not consistent with exercises A/P: doing better but encouraged more consistency with home exercises. Did not do vestibular rehab- just PT   #Hypertension S: Controlled on metoprolol alone A/P: Stable. Continue current medications.     #Hypothyroidism S: Compliant with levothyroxine 75 mcg Lab Results  Component Value Date   TSH 1.91 11/02/2019  A/P: hopefully stable- update TSH today. Continue current meds    #CKD stage III #osteoarthritis hands/elbows. voltaren failure in past- doing better now.  S: Compliant with blood pressure medications.  Sparing etodolac in the past-now offs with CKD and Xarelto use.uses tylenol arthritis 2 in Am and 2 in pm A/P: hopefully stable GFR - continue current medications.  Arthritis stable more recently with tylenol arthritis - she also may try lidocaine arthritis cream   #Hyperlipidemia S: Compliant with atorvastatin 20 mg Lab Results  Component Value Date   CHOL 147 11/02/2019   HDL 77 11/02/2019   LDLCALC 53  11/02/2019   LDLDIRECT 47.0 09/04/2015   TRIG 85 11/02/2019   CHOLHDL 1.9 11/02/2019  A/P: Stable. Continue current medications.    # Depression S: compliant with lexapro 10mg  Depression screen [facility] 2/9 05/07/2020 11/02/2019 06/27/2019  Decreased Interest 0 0 0  Down, Depressed, Hopeless 0 0 0  PHQ - 2 Score 0 0 0  Altered sleeping - 0 -  Tired, decreased energy - 0 -  Change in appetite - 0 -  Feeling bad or failure about yourself  - 0 -  Trouble concentrating - 0 -  Moving slowly or fidgety/restless - 0 -  Suicidal thoughts - 0 -  PHQ-9 Score - 0 -  Difficult doing work/chores - Not difficult at all -  Some recent data might be hidden  A/P: phq2 of 0- reasonable control- continue current meds  #GERD-on  famotidine 20 mg twice daily-reasonable control  #Osteopenia-January 2019 bone density largely stable.  Likely repeat early 2022 was planned.  She takes calcium and vitamin D and wants hold off on medicine unless worsening numbers- today she asks to wait until next visit   # used a fungus medication no knocks for her great toe on left foot- did not help- wondres if it was old. [facility]. Some pain around thi s when presses- skin somewhat macerated/peeling This looks like an irritated ingrown toeail- referral placed to podiatry today. If worsening symptoms prior to visit she should let us know.  -ok to hold xarelto the day before your visit with podiatry as well as the day of the visit- this would make It possible for them to do a procedure if needed  Recommended follow up: Return in about 6 months (around 11/04/2020) for physical or sooner if needed.  Lab/Order associations:   ICD-10-CM   1. Primary hypertension  I10 Comprehensive metabolic panel    CBC with Differential/Platelet  2. Hypothyroidism due to acquired atrophy of thyroid  E03.4 TSH  3. Hyperlipidemia, unspecified hyperlipidemia type  E78.5 CBC with Differential/Platelet  4. Ingrown toenail of  left foot  L60.0 Ambulatory referral to Podiatry  5. Recurrent major depressive disorder, in full remission ([place]) Chronic F33.42   6. Longstanding persistent atrial fibrillation ([place]) Chronic I48.11    Return precautions advised.  Garret Reddish, MD

## 2020-05-06 NOTE — Patient Instructions (Addendum)
-  ok to hold xarelto the day before your visit with podiatry as well as the day of the visit- this would make It possible for them to do a procedure if needed  We will call you within two weeks about your referral to podiatry. If you do not hear within 2 weeks, give [place] a call.   Please stop by lab before you go If you have mychart- we will send your results within 3 business days of [place] receiving them.  If you do not have mychart- we will call you about results within 5 business days of [place] receiving them.  *please also note that you will see labs on mychart as soon as they post. I will later go in and write notes on them- will say "notes from Dr. Yong Channel"  Recommended follow up: Return in about 6 months (around 11/04/2020) for physical or sooner if needed.

## 2020-05-07 ENCOUNTER — Ambulatory Visit (INDEPENDENT_AMBULATORY_CARE_PROVIDER_SITE_OTHER): Payer: Medicare Other | Admitting: Family Medicine

## 2020-05-07 ENCOUNTER — Other Ambulatory Visit: Payer: Self-pay

## 2020-05-07 ENCOUNTER — Encounter: Payer: Self-pay | Admitting: Family Medicine

## 2020-05-07 VITALS — BP 131/62 | HR 60 | Temp 97.7°F | Resp 17 | Ht 61.0 in | Wt 102.6 lb

## 2020-05-07 DIAGNOSIS — F3342 Major depressive disorder, recurrent, in full remission: Secondary | ICD-10-CM

## 2020-05-07 DIAGNOSIS — E034 Atrophy of thyroid (acquired): Secondary | ICD-10-CM

## 2020-05-07 DIAGNOSIS — L600 Ingrowing nail: Secondary | ICD-10-CM

## 2020-05-07 DIAGNOSIS — I4811 Longstanding persistent atrial fibrillation: Secondary | ICD-10-CM

## 2020-05-07 DIAGNOSIS — I10 Essential (primary) hypertension: Secondary | ICD-10-CM | POA: Diagnosis not present

## 2020-05-07 DIAGNOSIS — E785 Hyperlipidemia, unspecified: Secondary | ICD-10-CM

## 2020-05-07 LAB — CBC WITH DIFFERENTIAL/PLATELET
Basophils Absolute: 0.1 10*3/uL (ref 0.0–0.1)
Basophils Relative: 1.3 % (ref 0.0–3.0)
Eosinophils Absolute: 0.3 10*3/uL (ref 0.0–0.7)
Eosinophils Relative: 3.3 % (ref 0.0–5.0)
HCT: 40.3 % (ref 36.0–46.0)
Hemoglobin: 13.7 g/dL (ref 12.0–15.0)
Lymphocytes Relative: 23.8 % (ref 12.0–46.0)
Lymphs Abs: 2 10*3/uL (ref 0.7–4.0)
MCHC: 34 g/dL (ref 30.0–36.0)
MCV: 97.2 fl (ref 78.0–100.0)
Monocytes Absolute: 0.7 10*3/uL (ref 0.1–1.0)
Monocytes Relative: 8.8 % (ref 3.0–12.0)
Neutro Abs: 5.2 10*3/uL (ref 1.4–7.7)
Neutrophils Relative %: 62.8 % (ref 43.0–77.0)
Platelets: 191 10*3/uL (ref 150.0–400.0)
RBC: 4.14 Mil/uL (ref 3.87–5.11)
RDW: 12.8 % (ref 11.5–15.5)
WBC: 8.3 10*3/uL (ref 4.0–10.5)

## 2020-05-07 LAB — COMPREHENSIVE METABOLIC PANEL
ALT: 19 U/L (ref 0–35)
AST: 21 U/L (ref 0–37)
Albumin: 4.4 g/dL (ref 3.5–5.2)
Alkaline Phosphatase: 82 U/L (ref 39–117)
BUN: 35 mg/dL — ABNORMAL HIGH (ref 6–23)
CO2: 31 mEq/L (ref 19–32)
Calcium: 10.3 mg/dL (ref 8.4–10.5)
Chloride: 102 mEq/L (ref 96–112)
Creatinine, Ser: 1.02 mg/dL (ref 0.40–1.20)
GFR: 49.68 mL/min — ABNORMAL LOW (ref >60.00)
Glucose, Bld: 93 mg/dL (ref 70–99)
Potassium: 5.6 mEq/L — ABNORMAL HIGH (ref 3.5–5.1)
Sodium: 139 mEq/L (ref 135–145)
Total Bilirubin: 0.5 mg/dL (ref 0.2–1.2)
Total Protein: 6.9 g/dL (ref 6.0–8.3)

## 2020-05-07 LAB — TSH: TSH: 1.68 u[IU]/mL (ref 0.35–4.50)

## 2020-05-15 ENCOUNTER — Other Ambulatory Visit: Payer: Self-pay

## 2020-05-15 ENCOUNTER — Ambulatory Visit: Payer: Medicare Other | Admitting: Podiatry

## 2020-05-15 DIAGNOSIS — M79676 Pain in unspecified toe(s): Secondary | ICD-10-CM | POA: Diagnosis not present

## 2020-05-15 DIAGNOSIS — L600 Ingrowing nail: Secondary | ICD-10-CM

## 2020-05-15 NOTE — Progress Notes (Signed)
°  Subjective:  Patient ID: Theresa [name], female    DOB: Aug 29, 1931,  MRN: [id number]  Chief Complaint  Patient presents with   Nail Problem    ingrown toenail left hallux-req dr. Shantera Monts-dr. Annie Main hunter refer    87 y.o. female presents with the above complaint. History confirmed with patient. Has   Objective:  Physical Exam: warm, good capillary refill, no trophic changes or ulcerative lesions, normal DP and PT pulses and normal sensory exam.  Painful ingrowing nail at  medial border of the left, hallux; local warmth noted and serosanguinous drainage noted  Assessment:   1. Ingrown nail   2. Pain around toenail    Plan:  Patient was evaluated and treated and all questions answered.  Ingrown Nail, left -Palliative debridement of ingrowing nails in slant-back fashion to patient relief.  -Advised to soak in water and epsom salt. Written instructions provided.  Return if symptoms worsen or fail to improve.

## 2020-05-15 NOTE — Patient Instructions (Signed)

## 2020-05-28 ENCOUNTER — Other Ambulatory Visit: Payer: Self-pay | Admitting: Family Medicine

## 2020-06-04 DIAGNOSIS — H34831 Tributary (branch) retinal vein occlusion, right eye, with macular edema: Secondary | ICD-10-CM | POA: Diagnosis not present

## 2020-06-06 ENCOUNTER — Other Ambulatory Visit: Payer: Self-pay

## 2020-06-06 MED ORDER — METOPROLOL TARTRATE 100 MG PO TABS: 100.0000 mg | ORAL_TABLET | Freq: Two times a day (BID) | ORAL | 2 refills | Status: DC

## 2020-07-09 DIAGNOSIS — H34831 Tributary (branch) retinal vein occlusion, right eye, with macular edema: Secondary | ICD-10-CM | POA: Diagnosis not present

## 2020-07-14 ENCOUNTER — Other Ambulatory Visit: Payer: Self-pay | Admitting: Family Medicine

## 2020-07-23 DIAGNOSIS — H353232 Exudative age-related macular degeneration, bilateral, with inactive choroidal neovascularization: Secondary | ICD-10-CM | POA: Diagnosis not present

## 2020-08-07 ENCOUNTER — Other Ambulatory Visit: Payer: Self-pay | Admitting: Family Medicine

## 2020-08-16 DIAGNOSIS — H34831 Tributary (branch) retinal vein occlusion, right eye, with macular edema: Secondary | ICD-10-CM | POA: Diagnosis not present

## 2020-09-12 IMAGING — MR MR MRA HEAD W/O CM
1 series · 22 of 48 positions shown · non-contrast
Comparison: 6951

CLINICAL DATA: Dizziness

EXAM:
MRI HEAD WITHOUT CONTRAST
MRA HEAD WITHOUT CONTRAST
TECHNIQUE: Multiplanar, multiecho pulse sequences of the brain and surrounding
structures were obtained without intravenous contrast. Angiographic
images of the head were obtained using MRA technique without
contrast.

[Series 3: tof_3d_multi-slab · axial · 0.7mm · 0.35mm/px · z∈[-71,+49]mm · 22 of 182 slices shown]
[im 1/182]
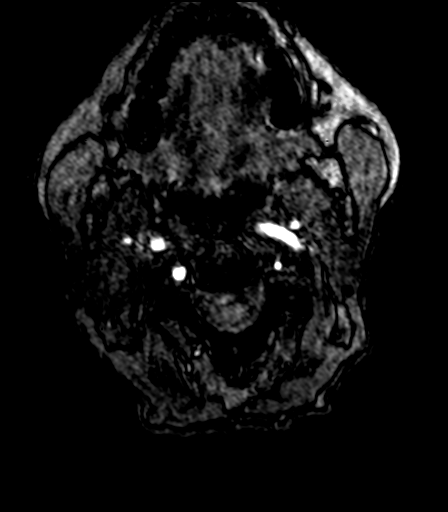
[im 4/182]
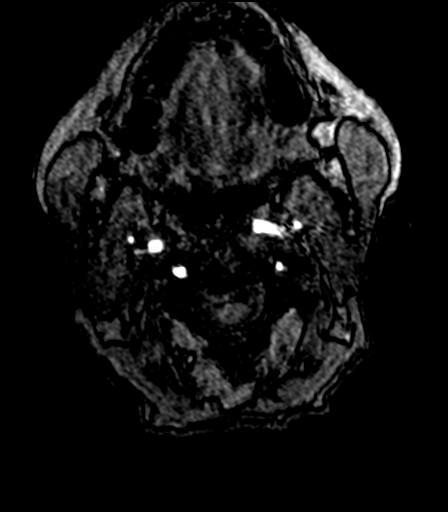
[im 8/182]
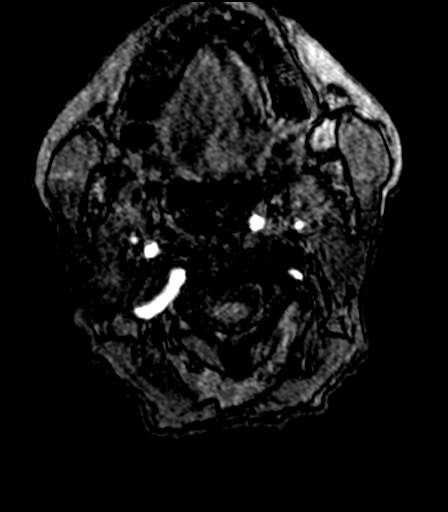
[im 12/182]
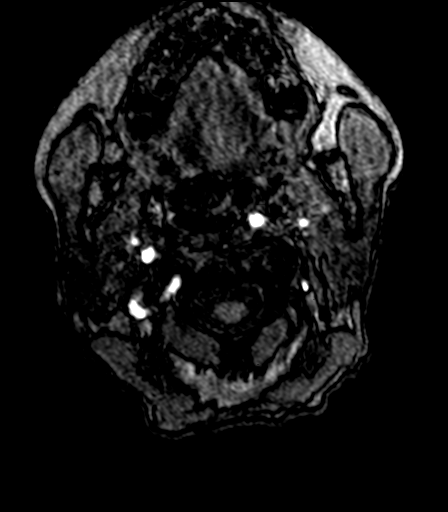
[im 16/182]
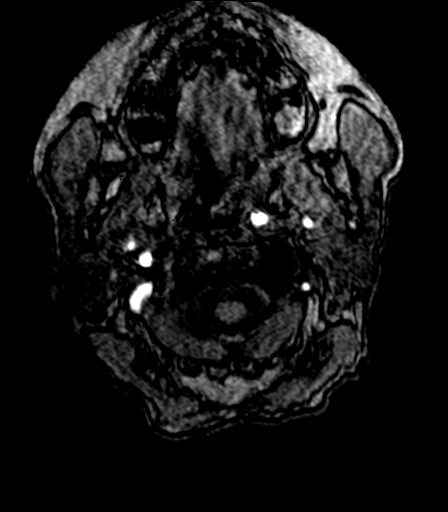
[im 20/182]
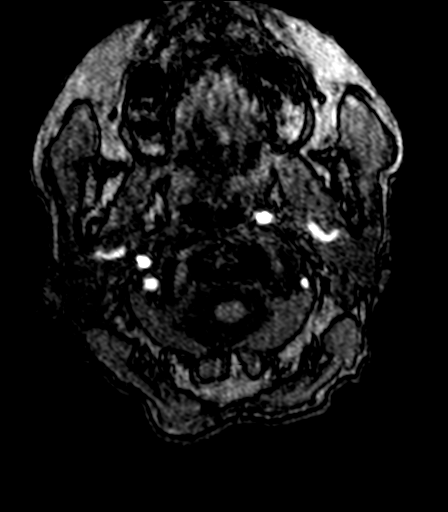
[im 24/182]
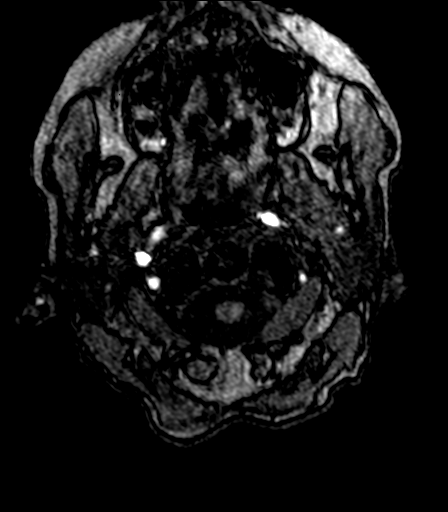
[im 27/182]
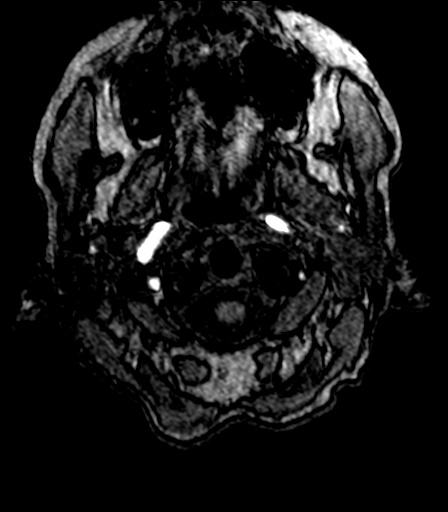
[im 31/182]
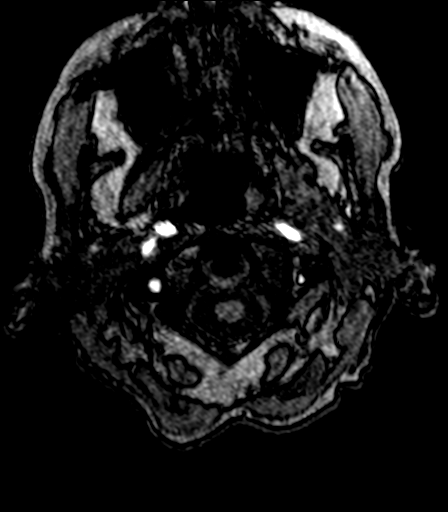
[im 35/182]
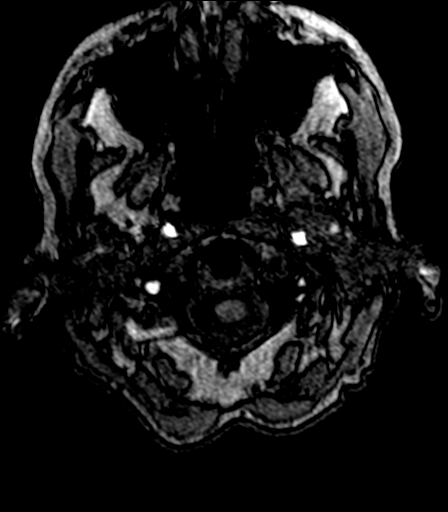
[im 39/182]
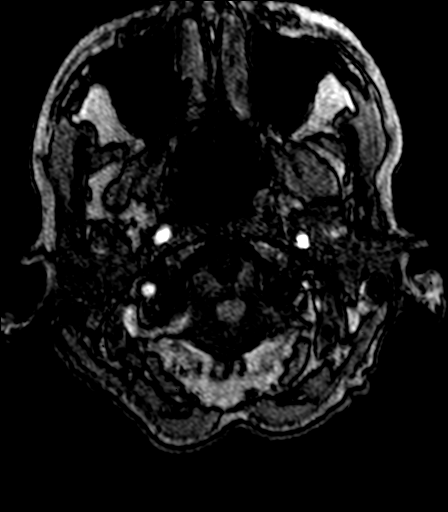
[im 43/182]
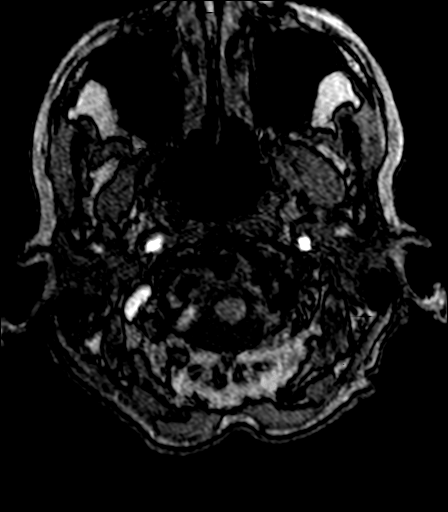
[im 47/182]
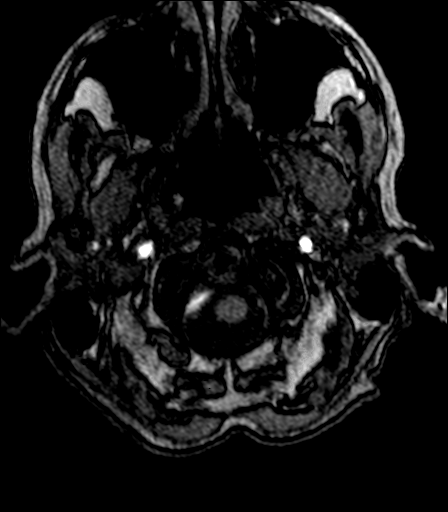
[im 51/182]
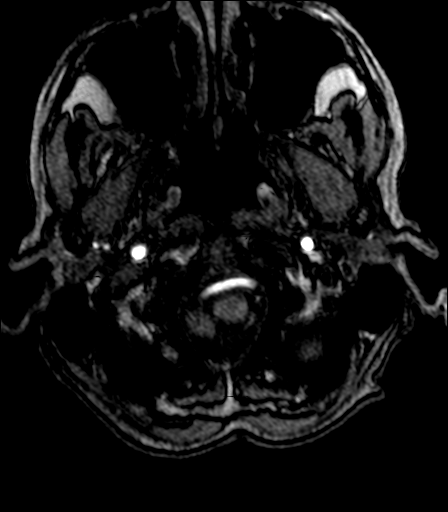
[im 58/182]
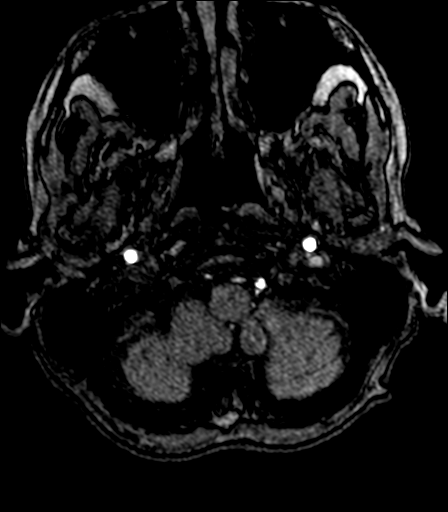
[im 81/182]
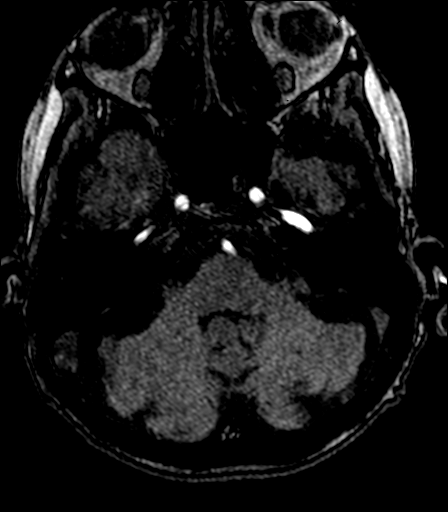
[im 93/182]
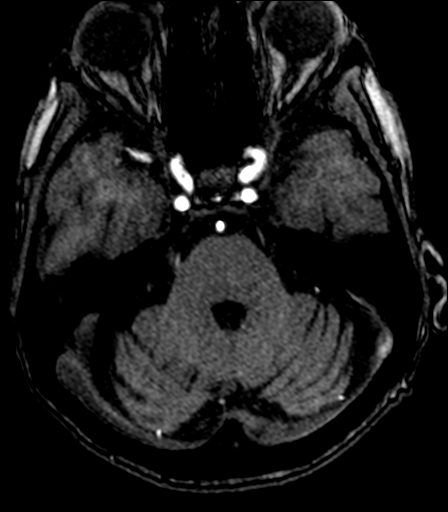
[im 104/182]
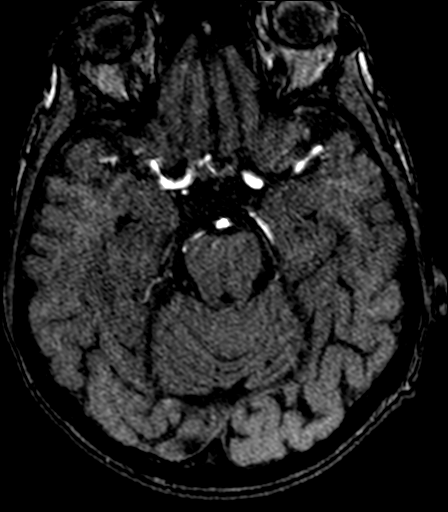
[im 128/182]
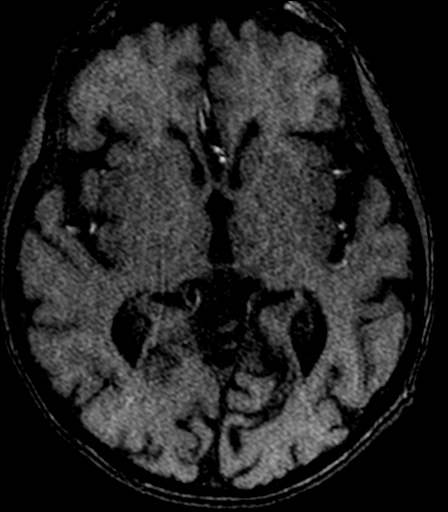
[im 151/182]
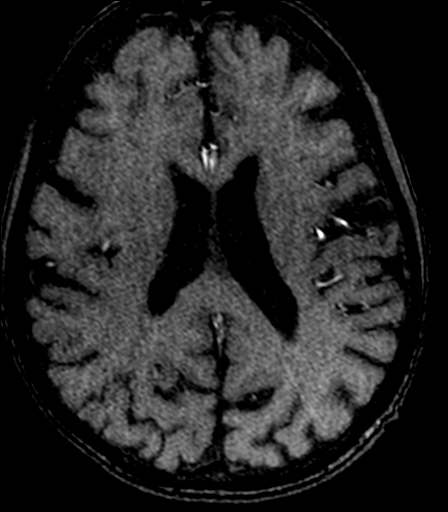
[im 155/182]
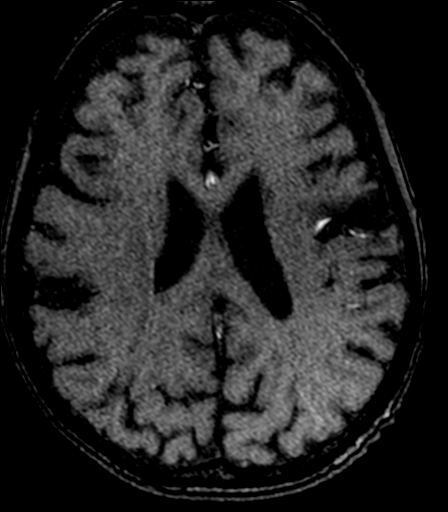
[im 174/182]
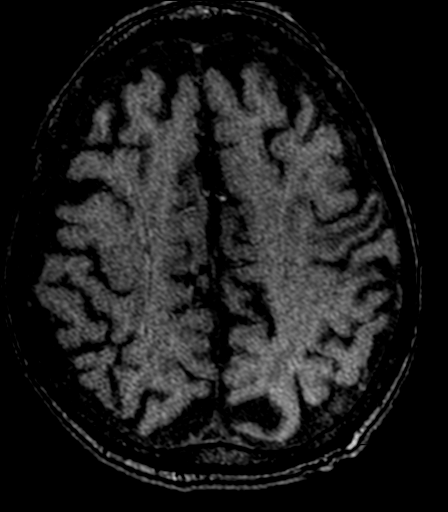

[22 of 48 positions shown; findings below may reference images not displayed]

FINDINGS: MRI HEAD

Brain: There is no acute infarction or intracranial hemorrhage.

Chronic left frontoparietal infarct. There are multiple small
infarcts of the cerebellum bilaterally. Additional patchy T2
hyperintensity in the supratentorial white matter is nonspecific
probably reflects mild chronic microvascular ischemic changes.
Prominence of the ventricles and sulci reflects generalized
parenchymal with some progression since 6951.

There is no intracranial mass, mass effect, or edema. There is no
hydrocephalus or extra-axial fluid collection.

Vascular: Major vessel flow voids at the skull base are preserved.

Skull and upper cervical spine: Normal marrow signal is preserved.

Sinuses/Orbits: Paranasal sinuses are aerated. Acute orbital
finding.

Other: Sella is unremarkable.  Mastoid air cells are clear.

MRA HEAD

Motion artifact is present. Intracranial internal carotid arteries
are patent with atherosclerotic irregularity. Middle and anterior
cerebral arteries are patent. Intracranial vertebral arteries are
patent. Right vertebral artery is dominant. Left vertebral artery
may terminate as a PICA. Basilar artery and posterior cerebral
arteries are patent. A right posterior communicating arteries is
present. There is no significant stenosis or aneurysm.
IMPRESSION: No evidence of recent infarction, hemorrhage, or mass.

Chronic left frontoparietal infarct. Multiple small chronic
cerebellar infarcts. Mild chronic microvascular ischemic changes.

No proximal intracranial vessel occlusion or significant stenosis.

## 2020-09-27 ENCOUNTER — Other Ambulatory Visit: Payer: Self-pay | Admitting: Family Medicine

## 2020-09-27 DIAGNOSIS — H34831 Tributary (branch) retinal vein occlusion, right eye, with macular edema: Secondary | ICD-10-CM | POA: Diagnosis not present

## 2020-10-01 DIAGNOSIS — H35453 Secondary pigmentary degeneration, bilateral: Secondary | ICD-10-CM | POA: Diagnosis not present

## 2020-10-01 DIAGNOSIS — H353132 Nonexudative age-related macular degeneration, bilateral, intermediate dry stage: Secondary | ICD-10-CM | POA: Diagnosis not present

## 2020-10-01 DIAGNOSIS — H34831 Tributary (branch) retinal vein occlusion, right eye, with macular edema: Secondary | ICD-10-CM | POA: Diagnosis not present

## 2020-10-01 DIAGNOSIS — H35363 Drusen (degenerative) of macula, bilateral: Secondary | ICD-10-CM | POA: Diagnosis not present

## 2020-10-24 ENCOUNTER — Other Ambulatory Visit: Payer: Self-pay | Admitting: Family Medicine

## 2020-10-29 NOTE — Progress Notes (Signed)
Patient ID: Theresa [name], female   DOB: 01-22-1932, 87 y.o.   MRN: [id number]     87 y.o.  has a history of mitral regurgitation, status post mitral valve repair at [facility] in 2008, chronic atrial fibrillation , prior stroke in 12/12 at [facility] (felt to be embolic), on  Xarelto for anticoagulation    Last echo 03/22/13  EF normal 55% and intact MV repair with no residual MR    11/26/15 seen by primary for right leg pain ? DJD given steroids ABI's Normal  Seen by Ortho   Seen by primary 11/26/16 with some dysphagia especially with glucosamine tablets declined EGD    She has poor balance and vision from macular degeneration   No cardiac complaints   Husband seeing Dr Su Grand for leaky valve.   No longer using Voltaren gel or famotidine   ROS: Denies fever, malais, weight loss, blurry vision, decreased visual acuity, cough, sputum, SOB, hemoptysis, pleuritic pain, palpitaitons, heartburn, abdominal pain, melena, lower extremity edema, claudication, or rash.  All other systems reviewed and negative  General: Affect appropriate Thin frail female  HEENT: normal Neck supple with no adenopathy JVP normal no bruits no thyromegaly Lungs clear with no wheezing and good diaphragmatic motion Heart:  S1/S2 no murmur, no rub, gallop or click PMI normal Abdomen: benighn, BS positve, no tenderness, no AAA no bruit.  No HSM or HJR Distal pulses intact with no bruits No edema Neuro non-focal Skin warm and dry No muscular weakness   Current Outpatient Medications  Medication Sig Dispense Refill   Acetaminophen (TYLENOL ARTHRITIS PAIN PO) Take by mouth.     atorvastatin (LIPITOR) 20 MG tablet Take 1 tablet (20 mg total) by mouth every evening. 90 tablet 1   Calcium Carbonate-Vitamin D 600-400 MG-UNIT tablet Take 1 tablet by mouth 2 (two) times daily.     Cholecalciferol (VITAMIN D) 2000 UNITS CAPS Take 2,000 Units by mouth daily.     digoxin (LANOXIN) 0.125 MG tablet  TAKE ONE-HALF TABLET BY MOUTH DAILY 45 tablet 3   escitalopram (LEXAPRO) 10 MG tablet TAKE 1 TABLET('10MG'$  TOTAL) BY MOUTH DAILY 90 tablet 3   famotidine (PEPCID) 20 MG tablet TAKE 1 TABLET BY MOUTH 2 TIMES DAILY GENERIC EQUIVALENT FOR PEPCID 180 tablet 3   glucosamine-chondroitin 500-400 MG tablet Take 1 tablet by mouth 2 (two) times daily.     levothyroxine (SYNTHROID) 75 MCG tablet One tab daily before breakfast 90 tablet 3   metoprolol tartrate (LOPRESSOR) 100 MG tablet Take 1 tablet (100 mg total) by mouth 2 (two) times daily. 180 tablet 2   Multiple Vitamins-Minerals (PRESERVISION AREDS 2 PO) Take 1 tablet by mouth 2 (two) times daily. Reported on 09/04/2015     rosuvastatin (CRESTOR) 20 MG tablet TAKE 1 TABLET BY MOUTH DAILY GENERIC EQUIVALENT FOR CRESTOR 90 tablet 3   vitamin A 10000 UNIT capsule Take 10,000 Units by mouth daily.     VOLTAREN 1 % GEL Apply topically 4 (four) times daily.     XARELTO 20 MG TABS tablet TAKE 1 TABLET BY MOUTH DAILY WITH SUPPER 90 tablet 3   No current facility-administered medications for this visit.    Allergies  Rofecoxib  Electrocardiogram:   10/31/2020    afib rate 59 nonspecific ST changes   Assessment and Plan  MV Repair: SBE prophylaxis  No murmur on exam intact by echo 2015 will update   PAF: On xarelto good rate control Appears to have chronic afib  Hct 40.3, PLT 191 Cr 1 labs ok 05/07/20 D/C digoxin   Thyroid:  Lab Results  Component Value Date   TSH 1.68 05/07/2020   Chol:    Lab Results  Component Value Date   LDLCALC 53 11/02/2019   Leg Pain: ? DJD ABI's were normal 12/05/15  no evidence of embolic event compliant with NOAC   Dysphagia:not interested in EGD cutting glucosamine tabs in half   Depression: controlled with Lexapro   Gait:  Seems neurologic and related to loss of visual cues with macular degeneration  Dig level low when last checked Has had prior cerebellar infarcts from afib MRI 09/21/19 no acute findings chronic left  frontoparietal infarct no vascular disease Should have PT/OT for safety balance issues but she deferred No recent falls   Thyroid:  on replacement TSH normal 03/2020 f/u Dr Yong Channel   D/C Digoxin   F/U in a year    Jenkins Rouge

## 2020-10-31 ENCOUNTER — Ambulatory Visit: Payer: Medicare Other | Admitting: Cardiovascular Disease

## 2020-10-31 ENCOUNTER — Other Ambulatory Visit: Payer: Self-pay

## 2020-10-31 VITALS — BP 132/72 | HR 64 | Ht 60.0 in

## 2020-10-31 DIAGNOSIS — Z9889 Other specified postprocedural states: Secondary | ICD-10-CM | POA: Diagnosis not present

## 2020-10-31 DIAGNOSIS — Z79899 Other long term (current) drug therapy: Secondary | ICD-10-CM

## 2020-10-31 DIAGNOSIS — I482 Chronic atrial fibrillation, unspecified: Secondary | ICD-10-CM | POA: Diagnosis not present

## 2020-10-31 NOTE — Patient Instructions (Signed)
Medication Instructions:  Your physician has recommended you make the following change in your medication:  1-STOP Digoxin 2-STOP Famotidine 3-STOP Voltaren Gel  *If you need a refill on your cardiac medications before your next appointment, please call your pharmacy*   Lab Work: If you have labs (blood work) drawn today and your tests are completely normal, you will receive your results only by: [place] (if you have MyChart) OR A paper copy in the mail If you have any lab test that is abnormal or we need to change your treatment, we will call you to review the results.  Follow-Up: At [facility], you and your health needs are our priority.  As part of our continuing mission to provide you with exceptional heart care, we have created designated Provider Care Teams.  These Care Teams include your primary Cardiologist (physician) and Advanced Practice Providers (APPs -  Physician Assistants and Nurse Practitioners) who all work together to provide you with the care you need, when you need it.  We recommend signing up for the patient portal called "MyChart".  Sign up information is provided on this After Visit Summary.  MyChart is used to connect with patients for Virtual Visits (Telemedicine).  Patients are able to view lab/test results, encounter notes, upcoming appointments, etc.  Non-urgent messages can be sent to your provider as well.   To learn more about what you can do with MyChart, go to [email or web address].    Your next appointment:   1 year(s)  The format for your next appointment:   In Person  Provider:   You may see Jenkins Rouge, MD or one of the following Advanced Practice Providers on your designated Care Team:   Cecilie Kicks, NP

## 2020-11-05 NOTE — Patient Instructions (Addendum)
Health Maintenance Due  Topic Date Due   COVID-19 Vaccine (4 - Booster for [employer] series)   -Please consider getting new variant specific vaccination in the Fall.   05/16/2020   INFLUENZA VACCINE   - Please consider getting your flu shot in the Fall. If you get this outside of our office, please let us know.  10/15/2020   Please stop by lab before you go If you have mychart- we will send your results within 3 business days of [place] receiving them.  If you do not have mychart- we will call you about results within 5 business days of [place] receiving them.  *please also note that you will see labs on mychart as soon as they post. I will later go in and write notes on them- will say "notes from Dr. Yong Channel"  Schedule your bone density test at check out desk. You may also call directly to X-ray at [phone] to schedule an appointment that is convenient for you.  - located [address] across the street from [place] - in the basement - you do need an appointment for the bone density tests.   Recommended follow up: Return in about 6 months (around 05/10/2021) for follow up- or sooner if needed.

## 2020-11-05 NOTE — Progress Notes (Signed)
Phone [phone]   Subjective:  Patient presents today for their annual physical. Chief complaint-noted.   See problem oriented charting- ROS- full  review of systems was completed and negative except for: visual problems per baseline, palpitations, joint pain, balance issues, activity change after covid, doesn't drive  The following were reviewed and entered/updated in epic: Past Medical History:  Diagnosis Date   Atrial fibrillation ([place])    AFIB   Collagenous colitis    CVA (cerebral vascular accident) ([place])    a.  L parietal CVA by MRI [id number], likely embolic;  b.  TEE by report with neg bubble study;  c.  carotid dopplers  12/12:  + plaque, no sig ICA stenosis    History of postmenopausal HRT    Hx of adenomatous colonic polyps    Hypothyroidism    MVP (mitral valve prolapse)    a. s/p MV repair at [facility] in 2008;  b. Echocardiogram 7/12: EF 50%, status post mitral valve repair with mild MR and minimal MS, mean gradient 4, moderate LAE, LA diam 55 mm; mild RAE, PASP 28-32;    Osteoarthritis    Osteoporosis    Patient Active Problem List   Diagnosis Date Noted   Balance disorder 05/03/2018    Priority: High   Macular degeneration 03/31/2017    Priority: High   TIA (transient ischemic attack) 03/21/2013    Priority: High   Hx of ischemic left MCA stroke 04/08/2011    Priority: High   Atrial fibrillation ([place]) 07/07/2007    Priority: High   History of mitral valve repair 09/11/2006    Priority: High   Hyperlipidemia 09/01/2014    Priority: Medium   CKD (chronic kidney disease), stage III ([place]) 03/02/2014    Priority: Medium   Major depression in full remission ([place]) 11/23/2013    Priority: Medium   Hypertension 03/04/2012    Priority: Medium   GASTROESOPHAGEAL REFLUX DISEASE, MILD 06/04/2009    Priority: Medium   Hypothyroidism 11/23/2006    Priority: Medium   Osteopenia 11/23/2006    Priority: Medium   Osteoarthritis 09/11/2006    Priority: Medium   Rosacea  11/23/2013    Priority: Low   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 05/30/2011    Priority: Low   PERSONAL HX COLONIC POLYPS 10/22/2009    Priority: Low   CHANGE IN BOWELS 09/20/2009    Priority: Low   Raynaud's syndrome 03/06/2009    Priority: Low   SCOLIOSIS, LUMBAR SPINE 01/03/2009    Priority: Low   Pain due to onychomycosis of toenails of both feet 12/23/2019   Difficulty swallowing pills 11/26/2016   Past Surgical History:  Procedure Laterality Date   CARDIOVERSION  01/09/2012   Procedure: CARDIOVERSION;  Surgeon: Jolaine Artist, MD;  [facility] ENDOSCOPY;  Service: Cardiovascular;  Laterality: N/A;   CATARACT EXTRACTION     CHOLECYSTECTOMY     DILATION AND CURETTAGE OF UTERUS     LUMBAR FUSION     MITRAL VALVE REPAIR  2008   TONSILLECTOMY      Family History  Problem Relation Age of Onset   Cancer Father        COLON   Stroke Neg Hx        1ST DEGREE RELATIVE <60    Medications- reviewed and updated Current Outpatient Medications  Medication Sig Dispense Refill   Acetaminophen (TYLENOL ARTHRITIS PAIN PO) Take by mouth.     atorvastatin (LIPITOR) 20 MG tablet Take 1 tablet (  20 mg total) by mouth every evening. 90 tablet 1   Calcium Carbonate-Vitamin D 600-400 MG-UNIT tablet Take 1 tablet by mouth 2 (two) times daily.     escitalopram (LEXAPRO) 10 MG tablet TAKE 1 TABLET('10MG'$  TOTAL) BY MOUTH DAILY 90 tablet 3   glucosamine-chondroitin 500-400 MG tablet Take 1 tablet by mouth 2 (two) times daily.     levothyroxine (SYNTHROID) 75 MCG tablet One tab daily before breakfast 90 tablet 3   metoprolol tartrate (LOPRESSOR) 100 MG tablet Take 1 tablet (100 mg total) by mouth 2 (two) times daily. 180 tablet 2   Multiple Vitamins-Minerals (PRESERVISION AREDS 2 PO) Take 1 tablet by mouth 2 (two) times daily. Reported on 09/04/2015     rosuvastatin (CRESTOR) 20 MG tablet TAKE 1 TABLET BY MOUTH DAILY GENERIC EQUIVALENT FOR CRESTOR 90 tablet 3   vitamin A 10000  UNIT capsule Take 10,000 Units by mouth daily.     XARELTO 20 MG TABS tablet TAKE 1 TABLET BY MOUTH DAILY WITH SUPPER 90 tablet 3   No current facility-administered medications for this visit.    Allergies-reviewed and updated Allergies  Allergen Reactions   Rofecoxib     REACTION: Elevated LFT's    Social History   Social History Narrative   Married for 87 years-1st marriage for both. No kids. No pets.       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 Oropharynx normal Neck: no thyromegaly CV: Bradycardic and irregularly irregular no murmurs rubs or gallops Lungs: CTAB no crackles, wheeze, rhonchi Abdomen: soft/nontender/nondistended/normal bowel sounds. No rebound or guarding.  Ext: no edema Skin: warm, dry Neuro: grossly normal, moves all extremities, PERRLA MSK: Good range of motion bilateral hips.  Negative straight leg raise on the right.  No midline pain on lumbar spinal cord or paraspinous muscle tenderness noted today but she is not actively having any significant pain   Assessment and Plan   87 y.o. female presenting for annual physical.  Health Maintenance counseling: 1. Anticipatory guidance: Patient counseled regarding regular dental exams -q6 months, eye exams-frequently with Dr.Stoneburner (she retired)- forgets name of new doctor- and specialist Dr.Patel for macular degeneration (injections are helping),  avoiding smoking and second hand smoke , limiting alcohol to 1 beverage per day- 2-3 oz of wine daily  .   2. Risk factor reduction:  Advised  patient of need for regular exercise and diet rich and fruits and vegetables to reduce risk of heart attack and stroke. Exercise- not exercising due to balance issues plus leg pain- recommended considered PT to find home exercises (discussed chair exercises as option), prior PT for her leg helped some . Diet- tried supplements calories-doing ice cream and premier protein shakes- before supper and still eats supper.  Wt Readings from Last 3 Encounters:  11/07/20 101 lb 9.6 oz (46.1 kg)  05/07/20 102 lb 9.6 oz (46.5 kg)  11/02/19 100 lb (45.4 kg)  3. Immunizations/screenings/ancillary studies  DISCUSSED:  - COVID-19 vaccination #4- discussed waiting for new variant specific vaccine -Flu vaccination (last one 11/21)- advised fall flu shot Immunization History  Administered Date(s) Administered   Fluad Quad(high Dose 65+) 11/30/2018, 01/17/2020   Influenza Split 12/17/2010, 12/02/2011   Influenza Whole 03/17/2001, 12/18/2006, 12/28/2007, 01/03/2009, 12/26/2009   Influenza, High Dose Seasonal PF 12/22/2012, 03/06/2015, 11/26/2015, 12/09/2017   Influenza,inj,Quad PF,6+ Mos 11/23/2013   Influenza-Unspecified 11/20/2016   PFIZER(Purple Top)SARS-COV-2 Vaccination 06/09/2019, 06/30/2019, 02/16/2020   Pneumococcal Conjugate-13 03/02/2014   Pneumococcal Polysaccharide-23 03/17/2000, 09/16/2006   Td 03/18/1995, 09/15/2006   Tdap 12/17/2010   Zoster Recombinat (Shingrix) 09/18/2016, 11/20/2016  4. Cervical cancer screening- no longer needed as past age based screening. No blood or discharge vaginally. 5. Breast cancer screening-  breast exam 04/11/20 and opted to continue due to dense breasts and higher risk   6. Colon cancer screening - no longer needed in age range. No blood in stool  7. Skin cancer screening- Followed by Dr.Lupton's office in past-hadn't been seen recently at the time- encouraged her to call to update. advised regular sunscreen use. Denies worrisome, changing, or new skin lesions.  8.  Birth control/STD check- monogamous/postmenopausal  9. Osteoporosis screening at 51- DEXA 04/01/2017 - see below will repeat -former smoker- quit in the 1960s- no regular screening   Status of chronic or acute concerns    #Atrial fibrillation S: On Xarelto 20 mg daily for anticoagulation and on metoprolol 100 mg twice a day for rate control. Followed with Dr. Johnsie Cancel. Discontinued digoxin.  A/P: Appropriately anticoagulated and rate controlled-continue current medication  #balance issues S: Issues increased at last visit since prior visit 08/21. Used cane when going out. Had walker but did not use due to size of doors in home. Cardiology had reached out to me and requested gait and balance training.  ENT had recommended vestibular rehabilitation.  Prior stroke likely contributed.  Note from Dr. Johnsie Cancel "Would benefit from PT/OT referral for balance . For her balance I discussed with her that vestibular rehabilitation with physical therapy would be an option to help improve her balance.  There is no medication that will improve her balance and discussed with her concerning consideration of vestibular rehab." A/P offered referral again today- declines for now- definitely continue use of cane and walker if needed- if any falls- consider referral  #hypertension S: medication: Controlled on metoprolol 100 mg twice daily alone A/P: Stable. Continue current medications.   #hypothyroidism S: compliant On thyroid medication-levothyroxine 75 mcg before breakfast Lab Results  Component Value Date   TSH 1.68 05/07/2020   A/P:hopefully stable- update tsh today. Continue current meds for now    CKD stage III #osteoarthritis hands/elbows. voltaren failure S: Compliant with blood pressure medications.  Spared etodolac in the past-have discouraged this with CKD and Xarelto use. using a roll on gel.  A/P: continued issues with arthritis- glad she is avoiding etodolac for her longterm safety though-  tolerates as best she can. Use of roll on gel sounds reasonable   #hyperlipidemia S: Medication:atorvastatin 20 mg before breakfast Lab Results  Component Value Date   CHOL 147 11/02/2019   HDL 77 11/02/2019   LDLCALC 53 11/02/2019   LDLDIRECT 47.0 09/04/2015   TRIG 85 11/02/2019   CHOLHDL 1.9 11/02/2019   A/P: well controlled on last check- update lipids  # Depression S: Medication:lexapro 10 mg daily   Depression screen [facility] 2/9 11/07/2020 05/07/2020 11/02/2019  Decreased Interest 0 0 0  Down, Depressed, Hopeless 0 0  0  PHQ - 2 Score 0 0 0  Altered sleeping 0 - 0  Tired, decreased energy 0 - 0  Change in appetite 0 - 0  Feeling bad or failure about yourself  0 - 0  Trouble concentrating 0 - 0  Moving slowly or fidgety/restless 0 - 0  Suicidal thoughts 0 - 0  PHQ-9 Score 0 - 0  Difficult doing work/chores Not difficult at all - Not difficult at all  Some recent data might be hidden  A/P: reasonable control- continue current meds  #GERD-on famotidine 20 mg twice daily in past-controlled symptoms well - now off!    #Osteopenia-January 2019 bone density largely was stable.  Likely repeat early 2021.  She take calcium and vitamin D and wanted hold off on medicine unless worsened numbers    #History of mitral valve repair-completed at [facility].  Followed with cardiology here locally   #possible sciatica- patient with radiating pain into right lower leg- starts in her right right low back. Pillow helps when props leg up with this. Has had back surgery before- declines sports med/ortho referral for now   Recommended follow up: No follow-ups on file.  Lab/Order associations: NOT fasting   ICD-10-CM   1. Preventative health care  Z00.00     2. Essential hypertension  I10     3. Hypothyroidism due to acquired atrophy of thyroid  E03.4     4. Stage 3 chronic kidney disease, unspecified whether stage 3a or 3b CKD (HCC)  N18.30     5. Hyperlipidemia, unspecified hyperlipidemia type   E78.5     6. Longstanding persistent atrial fibrillation (HCC)  I48.11     7. Primary osteoarthritis of both hands  M19.041    M19.042     8. Recurrent major depressive disorder, in full remission ([place])  F33.42       No orders of the defined types were placed in this encounter.  I,Jada Bradford,acting as a scribe for Garret Reddish, MD.,have documented all relevant documentation on the behalf of Garret Reddish, MD,as directed by  Garret Reddish, MD while in the presence of Garret Reddish, MD.  I, Garret Reddish, MD, have reviewed all documentation for this visit. The documentation on 11/07/20 for the exam, diagnosis, procedures, and orders are all accurate and complete.  Return precautions advised.  Garret Reddish, MD

## 2020-11-07 ENCOUNTER — Encounter: Payer: Self-pay | Admitting: Family Medicine

## 2020-11-07 ENCOUNTER — Ambulatory Visit (INDEPENDENT_AMBULATORY_CARE_PROVIDER_SITE_OTHER): Payer: Medicare Other | Admitting: Family Medicine

## 2020-11-07 ENCOUNTER — Other Ambulatory Visit: Payer: Self-pay

## 2020-11-07 VITALS — BP 135/76 | HR 61 | Temp 98.3°F | Ht 60.63 in | Wt 101.6 lb

## 2020-11-07 DIAGNOSIS — F3342 Major depressive disorder, recurrent, in full remission: Secondary | ICD-10-CM

## 2020-11-07 DIAGNOSIS — I10 Essential (primary) hypertension: Secondary | ICD-10-CM

## 2020-11-07 DIAGNOSIS — M19042 Primary osteoarthritis, left hand: Secondary | ICD-10-CM

## 2020-11-07 DIAGNOSIS — N183 Chronic kidney disease, stage 3 unspecified: Secondary | ICD-10-CM

## 2020-11-07 DIAGNOSIS — I4811 Longstanding persistent atrial fibrillation: Secondary | ICD-10-CM

## 2020-11-07 DIAGNOSIS — E034 Atrophy of thyroid (acquired): Secondary | ICD-10-CM | POA: Diagnosis not present

## 2020-11-07 DIAGNOSIS — Z0000 Encounter for general adult medical examination without abnormal findings: Secondary | ICD-10-CM

## 2020-11-07 DIAGNOSIS — M85851 Other specified disorders of bone density and structure, right thigh: Secondary | ICD-10-CM

## 2020-11-07 DIAGNOSIS — E785 Hyperlipidemia, unspecified: Secondary | ICD-10-CM

## 2020-11-07 DIAGNOSIS — M19041 Primary osteoarthritis, right hand: Secondary | ICD-10-CM

## 2020-11-07 LAB — LIPID PANEL
Cholesterol: 123 mg/dL (ref 0–200)
HDL: 67.6 mg/dL (ref >39.00)
LDL Cholesterol: 38 mg/dL (ref 0–99)
NonHDL: 55.29
Total CHOL/HDL Ratio: 2
Triglycerides: 85 mg/dL (ref 0.0–149.0)
VLDL: 17 mg/dL (ref 0.0–40.0)

## 2020-11-07 LAB — COMPREHENSIVE METABOLIC PANEL
ALT: 32 U/L (ref 0–35)
AST: 26 U/L (ref 0–37)
Albumin: 4.5 g/dL (ref 3.5–5.2)
Alkaline Phosphatase: 76 U/L (ref 39–117)
BUN: 41 mg/dL — ABNORMAL HIGH (ref 6–23)
CO2: 29 mEq/L (ref 19–32)
Calcium: 10.6 mg/dL — ABNORMAL HIGH (ref 8.4–10.5)
Chloride: 103 mEq/L (ref 96–112)
Creatinine, Ser: 1.11 mg/dL (ref 0.40–1.20)
GFR: 44.73 mL/min — ABNORMAL LOW (ref >60.00)
Glucose, Bld: 85 mg/dL (ref 70–99)
Potassium: 4.7 mEq/L (ref 3.5–5.1)
Sodium: 139 mEq/L (ref 135–145)
Total Bilirubin: 0.6 mg/dL (ref 0.2–1.2)
Total Protein: 7 g/dL (ref 6.0–8.3)

## 2020-11-07 LAB — TSH: TSH: 2.29 u[IU]/mL (ref 0.35–5.50)

## 2020-11-07 LAB — CBC WITH DIFFERENTIAL/PLATELET
Basophils Absolute: 0.2 10*3/uL — ABNORMAL HIGH (ref 0.0–0.1)
Basophils Relative: 2.2 % (ref 0.0–3.0)
Eosinophils Absolute: 0.2 10*3/uL (ref 0.0–0.7)
Eosinophils Relative: 2.9 % (ref 0.0–5.0)
HCT: 39.5 % (ref 36.0–46.0)
Hemoglobin: 13.2 g/dL (ref 12.0–15.0)
Lymphocytes Relative: 27.6 % (ref 12.0–46.0)
Lymphs Abs: 2.1 10*3/uL (ref 0.7–4.0)
MCHC: 33.4 g/dL (ref 30.0–36.0)
MCV: 98.4 fl (ref 78.0–100.0)
Monocytes Absolute: 0.7 10*3/uL (ref 0.1–1.0)
Monocytes Relative: 9 % (ref 3.0–12.0)
Neutro Abs: 4.5 10*3/uL (ref 1.4–7.7)
Neutrophils Relative %: 58.3 % (ref 43.0–77.0)
Platelets: 186 10*3/uL (ref 150.0–400.0)
RBC: 4.01 Mil/uL (ref 3.87–5.11)
RDW: 12.6 % (ref 11.5–15.5)
WBC: 7.7 10*3/uL (ref 4.0–10.5)

## 2020-11-08 DIAGNOSIS — H34831 Tributary (branch) retinal vein occlusion, right eye, with macular edema: Secondary | ICD-10-CM | POA: Diagnosis not present

## 2020-11-12 ENCOUNTER — Other Ambulatory Visit: Payer: Self-pay

## 2020-11-12 ENCOUNTER — Ambulatory Visit (INDEPENDENT_AMBULATORY_CARE_PROVIDER_SITE_OTHER)
Admission: RE | Admit: 2020-11-12 | Discharge: 2020-11-12 | Disposition: A | Discharge status: Home / Self Care | Payer: Medicare Other | Source: Ambulatory Visit | Attending: Family Medicine | Admitting: Family Medicine

## 2020-11-12 DIAGNOSIS — M85851 Other specified disorders of bone density and structure, right thigh: Secondary | ICD-10-CM | POA: Diagnosis not present

## 2020-11-16 DIAGNOSIS — M85851 Other specified disorders of bone density and structure, right thigh: Secondary | ICD-10-CM | POA: Diagnosis not present

## 2020-12-12 ENCOUNTER — Ambulatory Visit (INDEPENDENT_AMBULATORY_CARE_PROVIDER_SITE_OTHER): Payer: Medicare Other | Admitting: Cardiology

## 2020-12-12 ENCOUNTER — Encounter: Payer: Self-pay | Admitting: Cardiology

## 2020-12-12 DIAGNOSIS — Z0000 Encounter for general adult medical examination without abnormal findings: Secondary | ICD-10-CM

## 2020-12-12 NOTE — Progress Notes (Signed)
Subjective:   Theresa [name] is a 87 y.o. female who presents for Medicare Annual (Subsequent) preventive examination.  I connected with  Vernelle Wisner on 12/12/20 by a audio enabled telemedicine application and verified that I am speaking with the correct person using two identifiers.   I discussed the limitations of evaluation and management by telemedicine. The patient expressed understanding and agreed to proceed.  Location of Patient: Home Location of Provider: Office Persons participating in visit: Emonii Wienke and Rennis Harding, RN  Review of Systems    Defer to PCP  Cardiac Risk Factors include: advanced age (>48men, >42 women)     Objective:    There were no vitals filed for this visit. There is no height or weight on file to calculate BMI.  Advanced Directives 12/12/2020 06/27/2019 10/30/2017 10/29/2016 06/24/2016 09/28/2015 03/21/2013  Does Patient Have a Medical Advance Directive? Yes Yes Yes Yes Yes Yes Patient has advance directive, copy not in chart  Type of Advance Directive [place];Living will Living will;Healthcare Power of [place];Living will - [place];Living will - [place];Living will  Does patient want to make changes to medical advance directive? No - Patient declined No - Patient declined - - No - Patient declined - No  Copy of Healthcare Power of Attorney in Chart? No - copy requested Yes - validated most recent copy scanned in chart (See row information) Yes - Yes - Copy requested from family  Pre-existing out of facility DNR order (yellow form or pink MOST form) - - - - - - No    Current Medications (verified) Outpatient Encounter Medications as of 12/12/2020  Medication Sig   Acetaminophen (TYLENOL ARTHRITIS PAIN PO) Take by mouth.   Calcium Carbonate-Vitamin D 600-400 MG-UNIT tablet Take 1 tablet by mouth 2 (two) times daily.   escitalopram (LEXAPRO) 10 MG  tablet TAKE 1 TABLET(10MG  TOTAL) BY MOUTH DAILY   glucosamine-chondroitin 500-400 MG tablet Take 1 tablet by mouth 2 (two) times daily.   levothyroxine (SYNTHROID) 75 MCG tablet One tab daily before breakfast   metoprolol tartrate (LOPRESSOR) 100 MG tablet Take 1 tablet (100 mg total) by mouth 2 (two) times daily.   Multiple Vitamins-Minerals (PRESERVISION AREDS 2 PO) Take 1 tablet by mouth 2 (two) times daily. Reported on 09/04/2015   rosuvastatin (CRESTOR) 20 MG tablet TAKE 1 TABLET BY MOUTH DAILY GENERIC EQUIVALENT FOR CRESTOR   vitamin A 10000 UNIT capsule Take 10,000 Units by mouth daily.   XARELTO 20 MG TABS tablet TAKE 1 TABLET BY MOUTH DAILY WITH SUPPER   atorvastatin (LIPITOR) 20 MG tablet Take 1 tablet (20 mg total) by mouth every evening. (Patient not taking: Reported on 12/12/2020)   No facility-administered encounter medications on file as of 12/12/2020.    Allergies (verified) Rofecoxib   History: Past Medical History:  Diagnosis Date   Atrial fibrillation ([place])    AFIB   Collagenous colitis    CVA (cerebral vascular accident) ([place])    a.  L parietal CVA by MRI 32/67, likely embolic;  b.  TEE by report with neg bubble study;  c.  carotid dopplers  12/12:  + plaque, no sig ICA stenosis    History of postmenopausal HRT    Hx of adenomatous colonic polyps    Hypothyroidism    MVP (mitral valve prolapse)    a. s/p MV repair at [facility] in 2008;  b. Echocardiogram 7/12: EF 50%, status post  mitral valve repair with mild MR and minimal MS, mean gradient 4, moderate LAE, LA diam 55 mm; mild RAE, PASP 28-32;    Osteoarthritis    Osteoporosis    Past Surgical History:  Procedure Laterality Date   CARDIOVERSION  01/09/2012   Procedure: CARDIOVERSION;  Surgeon: Jolaine Artist, MD;  Location: [facility] ENDOSCOPY;  Service: Cardiovascular;  Laterality: N/A;   CATARACT EXTRACTION     CHOLECYSTECTOMY     DILATION AND CURETTAGE OF UTERUS     LUMBAR FUSION     MITRAL VALVE REPAIR  2008    TONSILLECTOMY     Family History  Problem Relation Age of Onset   Cancer Father        COLON   Stroke Neg Hx        1ST DEGREE RELATIVE <60   Social History   Socioeconomic History   Marital status: Married    Spouse name: Not on file   Number of children: Not on file   Years of education: Not on file   Highest education level: Master's degree (e.g., MA, MS, MEng, MEd, MSW, MBA)  Occupational History   Occupation: RETIRED    Employer: RETIRED    Comment: Engineer, mining  Tobacco Use   Smoking status: Former    Types: Cigarettes    Quit date: 04/28/1966    Years since quitting: 73.6   Smokeless tobacco: Never  Vaping Use   Vaping Use: Never used  Substance and Sexual Activity   Alcohol use: Yes    Alcohol/week: 7.0 standard drinks    Types: 7 Glasses of wine per week    Comment: 1 small glass with luch and dinner   Drug use: No   Sexual activity: Not Currently  Other Topics Concern   Not on file  Social History Narrative   Married for 71 years-1st marriage for both. No kids. No pets.       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   Lived in [place] for 10 years prior (masters in music and music education)-undergrad at [employer]: eating out, opera in [place] Strain: Low Risk    Difficulty of Paying Living Expenses: Not hard at [employer] Insecurity: No Food Insecurity   Worried About Charity fundraiser in the Last Year: Never true   Arboriculturist in the Last Year: Never true  Transportation Needs: No Transportation Needs   Lack of Transportation (Medical): No   Lack of Transportation (Non-Medical): No  Physical Activity: Inactive   Days of Exercise per Week: 0 days   Minutes of Exercise per Session: 0 min  Stress: No Stress Concern Present   Feeling of Stress : Not at all  Social Connections: Unknown   Frequency of Communication with Friends and Family: Once a week    Frequency of Social Gatherings with Friends and Family: Never   Attends Religious Services: Never   Marine scientist or Organizations: No   Attends Music therapist: Never   Marital Status: Not on file    Tobacco Counseling Counseling given: Not Answered   Clinical Intake:  Pre-visit preparation completed: Yes  Pain : No/denies pain     Nutritional Risks: None Diabetes: No  How often do you need to have someone help you when you read instructions, pamphlets, or other written materials from  your doctor or pharmacy?: 3 - Sometimes (vision) What is the last grade level you completed in school?: 18 years  Diabetic? No  Interpreter Needed?: No  Information entered by :: Rennis Harding, RN   Activities of Daily Living In your present state of health, do you have any difficulty performing the following activities: 12/12/2020 11/07/2020  Hearing? N N  Vision? Y N  Difficulty concentrating or making decisions? N N  Walking or climbing stairs? N N  Comment balance -  Dressing or bathing? N N  Doing errands, shopping? N N  Preparing Food and eating ? N -  Using the Toilet? N -  In the past six months, have you accidently leaked urine? N -  Do you have problems with loss of bowel control? N -  Managing your Medications? N -  Managing your Finances? N -  Housekeeping or managing your Housekeeping? Y -  Comment husband -  Some recent data might be hidden    Patient Care Team: Marin Olp, MD as PCP - General (Family Medicine) Josue Hector, MD as PCP - Cardiology (Cardiology) Shon Hough, MD as Consulting Physician (Ophthalmology) Ricki Miller, Tana Felts, MD as Referring Physician (Ophthalmology) Star Age, MD as Consulting Physician (Neurology)  Indicate any recent Medical Services you may have received from other than [facility] providers in the past year (date may be approximate).     Assessment:   This is a routine wellness examination  for Yannis.  Hearing/Vision screen No results found.  Dietary issues and exercise activities discussed: Current Exercise Habits: The patient does not participate in regular exercise at present, Exercise limited by: Other - see comments (balance issues)   Goals Addressed   None   Depression Screen PHQ 2/9 Scores 12/12/2020 11/07/2020 05/07/2020 11/02/2019 06/27/2019 05/04/2019 11/01/2018  PHQ - 2 Score 0 0 0 0 0 0 0  PHQ- 9 Score 0 0 - 0 - 0 0  Exception Documentation - - - - - - -  Not completed - - - - - - -    Fall Risk Fall Risk  12/12/2020 05/07/2020 11/02/2019 06/27/2019 11/01/2018  Falls in the past year? 0 0 0 1 1  Number falls in past yr: 0 0 0 1 1  Injury with Fall? 0 0 0 0 0  Risk for fall due to : No Fall Risks Impaired balance/gait;Impaired mobility Impaired balance/gait Impaired balance/gait;Impaired mobility;History of fall(s) Impaired balance/gait;History of fall(s);Impaired vision  Risk for fall due to: Comment - - uses cain - -  Follow up - - - Falls evaluation completed;Education provided;Falls prevention discussed -    FALL RISK PREVENTION PERTAINING TO THE HOME:  Any stairs in or around the home? Yes  attic, doesn't go up these If so, are there any without handrails? Yes  Home free of loose throw rugs in walkways, pet beds, electrical cords, etc? Yes  Adequate lighting in your home to reduce risk of falls? Yes   ASSISTIVE DEVICES UTILIZED TO PREVENT FALLS:  Life alert? No  Use of a cane, walker or w/c? Yes  Grab bars in the bathroom? Yes  Shower chair or bench in shower? Yes  Elevated toilet seat or a handicapped toilet? Yes   TIMED UP AND GO:  Was the test performed? No .  Length of time to ambulate 10 feet: N/A sec.     Cognitive Function: MMSE - Mini Mental State Exam 10/30/2017 10/29/2016 09/28/2015 02/15/2014  Not completed: - - (No Data) -  Orientation to time 4 5 4 5   Orientation to Place 5 5 5 5   Registration 3 3 3 3   Attention/ Calculation 5 5 5 3    Recall 2 3 2 3   Language- name 2 objects 2 2 2 2   Language- repeat 1 1 1 1   Language- follow 3 step command 3 3 3 3   Language- read & follow direction 1 1 1 1   Write a sentence 1 1 1 1   Copy design 1 1 1 1   Total score 28 30 28 28      6CIT Screen 12/12/2020 06/27/2019  What Year? 0 points 0 points  What month? 0 points 0 points  What time? 0 points 0 points  Count back from 20 0 points 0 points  Months in reverse 0 points 0 points  Repeat phrase 0 points 0 points  Total Score 0 0    Immunizations Immunization History  Administered Date(s) Administered   Fluad Quad(high Dose 65+) 11/30/2018, 01/17/2020   Influenza Split 12/17/2010, 12/02/2011   Influenza Whole 03/17/2001, 12/18/2006, 12/28/2007, 01/03/2009, 12/26/2009   Influenza, High Dose Seasonal PF 12/22/2012, 03/06/2015, 11/26/2015, 12/09/2017   Influenza,inj,Quad PF,6+ Mos 11/23/2013   Influenza-Unspecified 11/20/2016   PFIZER(Purple Top)SARS-COV-2 Vaccination 06/09/2019, 06/30/2019, 02/16/2020   Pneumococcal Conjugate-13 03/02/2014   Pneumococcal Polysaccharide-23 03/17/2000, 09/16/2006   Td 03/18/1995, 09/15/2006   Tdap 12/17/2010   Unspecified SARS-COV-2 Vaccination 12/08/2020   Zoster Recombinat (Shingrix) 09/18/2016, 11/20/2016    TDAP status: Due, Education has been provided regarding the importance of this vaccine. Advised may receive this vaccine at local pharmacy or Health Dept. Aware to provide a copy of the vaccination record if obtained from local pharmacy or Health Dept. Verbalized acceptance and understanding.  Flu Vaccine status: Due, Education has been provided regarding the importance of this vaccine. Advised may receive this vaccine at local pharmacy or Health Dept. Aware to provide a copy of the vaccination record if obtained from local pharmacy or Health Dept. Verbalized acceptance and understanding.  Pneumococcal vaccine status: Declined,  Education has been provided regarding the importance of this  vaccine but patient still declined. Advised may receive this vaccine at local pharmacy or Health Dept. Aware to provide a copy of the vaccination record if obtained from local pharmacy or Health Dept. Verbalized acceptance and understanding.   Covid-19 vaccine status: Completed vaccines  Qualifies for Shingles Vaccine? Yes   Zostavax completed Yes   Shingrix Completed?: No.    Education has been provided regarding the importance of this vaccine. Patient has been advised to call insurance company to determine out of pocket expense if they have not yet received this vaccine. Advised may also receive vaccine at local pharmacy or Health Dept. Verbalized acceptance and understanding.  Screening Tests Health Maintenance  Topic Date Due   INFLUENZA VACCINE  10/15/2020   TETANUS/TDAP  12/16/2020   COVID-19 Vaccine (5 - Booster for Pfizer series) 04/09/2021   DEXA SCAN  Completed   Zoster Vaccines- Shingrix  Completed   HPV VACCINES  Aged Out    Health Maintenance  Health Maintenance Due  Topic Date Due   INFLUENZA VACCINE  10/15/2020    Colorectal cancer screening: No longer required.   Mammogram status: Completed 2021. Repeat every year ? When they call her  Bone Density status: Completed 2022. Results reflect: Bone density results: OSTEOPOROSIS. Repeat every 2 years.  Lung Cancer Screening: (Low Dose CT Chest recommended if Age 77-80 years, 30 pack-year currently smoking OR have quit w/in 15years.) does not  qualify.   Lung Cancer Screening Referral: N/A  Additional Screening:  Hepatitis C Screening: does not qualify; Completed   Vision Screening: Recommended annual ophthalmology exams for early detection of glaucoma and other disorders of the eye. Is the patient up to date with their annual eye exam?  Yes  Who is the provider or what is the name of the office in which the patient attends annual eye exams? Dr Posey Pronto If pt is not established with a provider, would they like to be  referred to a provider to establish care? No .   Dental Screening: Recommended annual dental exams for proper oral hygiene  Community Resource Referral / Chronic Care Management: CRR required this visit?  No   CCM required this visit?  No      Plan:     I have personally reviewed and noted the following in the patient's chart:   Medical and social history Use of alcohol, tobacco or illicit drugs  Current medications and supplements including opioid prescriptions.  Functional ability and status Nutritional status Physical activity Advanced directives List of other physicians Hospitalizations, surgeries, and ER visits in previous 12 months Vitals Screenings to include cognitive, depression, and falls Referrals and appointments  In addition, I have reviewed and discussed with patient certain preventive protocols, quality metrics, and best practice recommendations. A written personalized care plan for preventive services as well as general preventive health recommendations were provided to patient.  Patient will get flu vaccine and tetanus vaccine in the next month or 2     Rennis Harding, RN   12/12/2020   Nurse Notes:   Non-Face to Face 40 minute visit   Ms. Heide Spark , Thank you for taking time to come for your Medicare Wellness Visit. I appreciate your ongoing commitment to your health goals. Please review the following plan we discussed and let me know if I can assist you in the future.   These are the goals we discussed:  Goals      patient     Did like to travel; think about options; Private trip with other; plan something away this year      patient     Improve balance I will send EMMI information [email or web address]           This is a list of the screening recommended for you and due dates:  Health Maintenance  Topic Date Due   Flu Shot  10/15/2020   Tetanus Vaccine  12/16/2020   COVID-19 Vaccine (5 - Booster for [place] series) 04/09/2021   DEXA  scan (bone density measurement)  Completed   Zoster (Shingles) Vaccine  Completed   HPV Vaccine  Aged Out

## 2020-12-12 NOTE — Patient Instructions (Signed)
Health Maintenance, Female Adopting a healthy lifestyle and getting preventive care are important in promoting health and wellness. Ask your health care provider about: The right schedule for you to have regular tests and exams. Things you can do on your own to prevent diseases and keep yourself healthy. What should I know about diet, weight, and exercise? Eat a healthy diet  Eat a diet that includes plenty of vegetables, fruits, low-fat dairy products, and lean protein. Do not eat a lot of foods that are high in solid fats, added sugars, or sodium. Maintain a healthy weight Body mass index (BMI) is used to identify weight problems. It estimates body fat based on height and weight. Your health care provider can help determine your BMI and help you achieve or maintain a healthy weight. Get regular exercise Get regular exercise. This is one of the most important things you can do for your health. Most adults should: Exercise for at least 150 minutes each week. The exercise should increase your heart rate and make you sweat (moderate-intensity exercise). Do strengthening exercises at least twice a week. This is in addition to the moderate-intensity exercise. Spend less time sitting. Even light physical activity can be beneficial. Watch cholesterol and blood lipids Have your blood tested for lipids and cholesterol at 85 years of age, then have this test every 5 years. Have your cholesterol levels checked more often if: Your lipid or cholesterol levels are high. You are older than 85 years of age. You are at high risk for heart disease. What should I know about cancer screening? Depending on your health history and family history, you may need to have cancer screening at various ages. This may include screening for: Breast cancer. Cervical cancer. Colorectal cancer. Skin cancer. Lung cancer. What should I know about heart disease, diabetes, and high blood pressure? Blood pressure and heart  disease High blood pressure causes heart disease and increases the risk of stroke. This is more likely to develop in people who have high blood pressure readings, are of African descent, or are overweight. Have your blood pressure checked: Every 3-5 years if you are 18-39 years of age. Every year if you are 40 years old or older. Diabetes Have regular diabetes screenings. This checks your fasting blood sugar level. Have the screening done: Once every three years after age 40 if you are at a normal weight and have a low risk for diabetes. More often and at a younger age if you are overweight or have a high risk for diabetes. What should I know about preventing infection? Hepatitis B If you have a higher risk for hepatitis B, you should be screened for this virus. Talk with your health care provider to find out if you are at risk for hepatitis B infection. Hepatitis C Testing is recommended for: Everyone born from 1945 through 1965. Anyone with known risk factors for hepatitis C. Sexually transmitted infections (STIs) Get screened for STIs, including gonorrhea and chlamydia, if: You are sexually active and are younger than 85 years of age. You are older than 85 years of age and your health care provider tells you that you are at risk for this type of infection. Your sexual activity has changed since you were last screened, and you are at increased risk for chlamydia or gonorrhea. Ask your health care provider if you are at risk. Ask your health care provider about whether you are at high risk for HIV. Your health care provider may recommend a prescription medicine   to help prevent HIV infection. If you choose to take medicine to prevent HIV, you should first get tested for HIV. You should then be tested every 3 months for as long as you are taking the medicine. Pregnancy If you are about to stop having your period (premenopausal) and you may become pregnant, seek counseling before you get  pregnant. Take 400 to 800 micrograms (mcg) of folic acid every day if you become pregnant. Ask for birth control (contraception) if you want to prevent pregnancy. Osteoporosis and menopause Osteoporosis is a disease in which the bones lose minerals and strength with aging. This can result in bone fractures. If you are 65 years old or older, or if you are at risk for osteoporosis and fractures, ask your health care provider if you should: Be screened for bone loss. Take a calcium or vitamin D supplement to lower your risk of fractures. Be given hormone replacement therapy (HRT) to treat symptoms of menopause. Follow these instructions at home: Lifestyle Do not use any products that contain nicotine or tobacco, such as cigarettes, e-cigarettes, and chewing tobacco. If you need help quitting, ask your health care provider. Do not use street drugs. Do not share needles. Ask your health care provider for help if you need support or information about quitting drugs. Alcohol use Do not drink alcohol if: Your health care provider tells you not to drink. You are pregnant, may be pregnant, or are planning to become pregnant. If you drink alcohol: Limit how much you use to 0-1 drink a day. Limit intake if you are breastfeeding. Be aware of how much alcohol is in your drink. In the [place] one drink equals one 12 oz bottle of beer (355 mL), one 5 oz glass of wine (148 mL), or one 1 oz glass of hard liquor (44 mL). General instructions Schedule regular health, dental, and eye exams. Stay current with your vaccines. Tell your health care provider if: You often feel depressed. You have ever been abused or do not feel safe at home. Summary Adopting a healthy lifestyle and getting preventive care are important in promoting health and wellness. Follow your health care provider's instructions about healthy diet, exercising, and getting tested or screened for diseases. Follow your health care provider's  instructions on monitoring your cholesterol and blood pressure. This information is not intended to replace advice given to you by your health care provider. Make sure you discuss any questions you have with your health care provider. Document Revised: 05/11/2020 Document Reviewed: 02/24/2018 Elsevier Patient Education  2022 Elsevier Inc.  

## 2020-12-13 DIAGNOSIS — H34831 Tributary (branch) retinal vein occlusion, right eye, with macular edema: Secondary | ICD-10-CM | POA: Diagnosis not present

## 2020-12-20 ENCOUNTER — Telehealth: Payer: Self-pay | Admitting: Family Medicine

## 2020-12-20 NOTE — Progress Notes (Signed)
  Care Management   Follow Up Note   12/20/2020 Name: Theresa [name] MRN: [id number] DOB: 1931-06-09   Referred by: Marin Olp, MD Reason for referral : No chief complaint on file.   An unsuccessful telephone outreach was attempted today. The patient was referred to the case management team for assistance with care management and care coordination.   Follow Up Plan: No further follow up required:    [place]

## 2020-12-31 ENCOUNTER — Telehealth: Payer: Self-pay | Admitting: Family Medicine

## 2020-12-31 NOTE — Chronic Care Management (AMB) (Signed)
  Care Management  Note   12/31/2020 Name: Theresa [name] MRN: [id number] DOB: January 03, 1932  Theresa [name] is a 87 y.o. year old female who is a primary care patient of Yong Channel, Brayton Mars, MD. The care management team was consulted for assistance with chronic disease management and care coordination needs.   [name] was given information about Care Management services today including:  CCM service includes personalized support from designated clinical staff supervised by the physician, including individualized plan of care and coordination with other care providers 24/7 contact phone numbers for assistance for urgent and routine care needs. Service will only be billed when office clinical staff spend 20 minutes or more in a month to coordinate care. Only one practitioner may furnish and bill the service in a calendar month. The patient may stop CCM services at amy time (effective at the end of the month) by phone call to the office staff. The patient will be responsible for cost sharing (co-pay) or up to 20% of the service fee (after annual deductible is met)  Patient agreed to services and verbal consent obtained.  Follow up plan:   Face to Face appointment with care management team member scheduled for: 02-25-21 _0   Noelle Penner Upstream Scheduler

## 2021-01-23 ENCOUNTER — Telehealth: Payer: Self-pay | Admitting: Family Medicine

## 2021-01-23 NOTE — Progress Notes (Signed)
  Care Management  Note   01/23/2021 Name: [place] Hanlon MRN: [id number] DOB: 1931/09/19  Simranjit Thayer Stoltzfus is a 87 y.o. year old female who is a primary care patient of Yong Channel, Brayton Mars, MD. The care management team was consulted for assistance with chronic disease management and care coordination needs.   Ms. Mcglown was given information about Care Management services today including:  CCM service includes personalized support from designated clinical staff supervised by the physician, including individualized plan of care and coordination with other care providers 24/7 contact phone numbers for assistance for urgent and routine care needs. Service will only be billed when office clinical staff spend 20 minutes or more in a month to coordinate care. Only one practitioner may furnish and bill the service in a calendar month. The patient may stop CCM services at amy time (effective at the end of the month) by phone call to the office staff. The patient will be responsible for cost sharing (co-pay) or up to 20% of the service fee (after annual deductible is met)  Patient agreed to services and verbal consent obtained.  Follow up plan:   Face to Face appointment with care management team member scheduled for: 02/25/21 $RemoveBefor'@11am'[email or web address]$   Noelle Penner Upstream Scheduler

## 2021-01-30 DIAGNOSIS — H34831 Tributary (branch) retinal vein occlusion, right eye, with macular edema: Secondary | ICD-10-CM | POA: Diagnosis not present

## 2021-02-18 ENCOUNTER — Telehealth: Payer: Self-pay | Admitting: Pharmacist

## 2021-02-18 NOTE — Chronic Care Management (AMB) (Signed)
Chronic Care Management Pharmacy Assistant   Name: Theresa [name]  MRN: [id number] DOB: [birth year]  Reason for Encounter: Chart Review For Initial Visit With Clinical Pharmacist.   Conditions to be addressed/monitored: Atrial Fibrillation, HTN, TIA, GERD, Hypothyroidism, OA, CKD, HLD  Primary concerns for visit include: Atrial Fibrillation, HTN, HLD, Hypothyroidism  Recent office visits:  11/07/2020 [facility] (PCP) Marin Olp, MD; Discontinued digoxin.  Recent consult visits:  10/31/2020 [facility] (cardiology) Josue Hector, MD; 1-STOP Digoxin 2-STOP Famotidine 3-STOP Voltaren Gel  Hospital visits:  None in previous 6 months  Medications: Outpatient Encounter Medications as of 02/18/2021  Medication Sig   Acetaminophen (TYLENOL ARTHRITIS PAIN PO) Take by mouth.   atorvastatin (LIPITOR) 20 MG tablet Take 1 tablet (20 mg total) by mouth every evening. (Patient not taking: Reported on 12/12/2020)   Calcium Carbonate-Vitamin D 600-400 MG-UNIT tablet Take 1 tablet by mouth 2 (two) times daily.   escitalopram (LEXAPRO) 10 MG tablet TAKE 1 TABLET(10MG  TOTAL) BY MOUTH DAILY   glucosamine-chondroitin 500-400 MG tablet Take 1 tablet by mouth 2 (two) times daily.   levothyroxine (SYNTHROID) 75 MCG tablet One tab daily before breakfast   metoprolol tartrate (LOPRESSOR) 100 MG tablet Take 1 tablet (100 mg total) by mouth 2 (two) times daily.   Multiple Vitamins-Minerals (PRESERVISION AREDS 2 PO) Take 1 tablet by mouth 2 (two) times daily. Reported on 09/04/2015   rosuvastatin (CRESTOR) 20 MG tablet TAKE 1 TABLET BY MOUTH DAILY GENERIC EQUIVALENT FOR CRESTOR   vitamin A 10000 UNIT capsule Take 10,000 Units by mouth daily.   XARELTO 20 MG TABS tablet TAKE 1 TABLET BY MOUTH DAILY WITH SUPPER   No facility-administered encounter medications on file as of 02/18/2021.   Current Medications: Rosuvastatin 20 mg last filled 01/07/2021 90 DS Escitalopram 10 mg last filled 01/07/2021 90  DS Metoprolol Tartrate 100 mg last filled 01/07/2021 90 DS Xarelto 20 mg last filled 12/06/2020 90 DS Levothyroxine 75 mcg last filled 01/07/2021 90 DS Atorvastatin 20 mg no longer taking Priobiotic Gummies Acetaminophen Arthritis takes two tablets in am and two in p, Vitamin A 10,000 unit Multiple Vitamins-Minerals for Women Calcium Carbonate-Vitamin D 600-400 mg Glucosamine-chondroitin 500-400 mg  Patient Questions: Any changes in your medications or health? Patient states she hasn't had any changes recently with her medications or health.  Any side effects from any medications?  "Not that I am aware of."  Do you have any symptoms or problems not managed by your medications? "Well, the balance problem that I have seems to be getting worse and my vision is getting worse."  Any concerns about your health right now? "I'm just aging."  Has your provider asked that you check blood pressure, blood sugar, or follow special diet at home? Patient states she doesn't check her blood pressure or blood sugar. She does not follow any special diet.  Do you get any type of exercise on a regular basis? "Not really, I use a cane around the house and when I go out. My balance problem keeps me from walking. I used to walk everyday."  Can you think of a goal you would like to reach for your health? "Just maintain where I am if possible."  Do you have any problems getting your medications? "Not at the moment, not currently."  Is there anything that you would like to discuss during the appointment?  Patient states she would like to discuss her balance issues. She states she is unsure if there is a  medication that will help or not.  Please bring medications and supplements to appointment  Care Gaps: Medicare Annual Wellness: Completed 12/12/2020 Hemoglobin A1C: 5.4% on 10/05/2013 Colonoscopy: Completed 09/27/2009 Dexa Scan: Completed Mammogram: Completed  Future Appointments  Date Time  Provider [place]  02/25/2021 11:00 AM [facility] CCM PHARMACIST [facility]  05/13/2021 10:40 AM Marin Olp, MD [facility]    Star Rating Drugs: Rosuvastatin 20 mg last filled 01/07/2021 90 DS  April D Calhoun, [place] Pharmacist Assistant [phone]

## 2021-02-22 ENCOUNTER — Encounter: Payer: Self-pay | Admitting: Family Medicine

## 2021-02-22 ENCOUNTER — Other Ambulatory Visit: Payer: Self-pay

## 2021-02-22 ENCOUNTER — Ambulatory Visit: Payer: Medicare Other | Admitting: Family Medicine

## 2021-02-22 VITALS — BP 120/70 | HR 70 | Temp 97.2°F | Ht 60.0 in | Wt 103.2 lb

## 2021-02-22 DIAGNOSIS — I4811 Longstanding persistent atrial fibrillation: Secondary | ICD-10-CM | POA: Diagnosis not present

## 2021-02-22 DIAGNOSIS — I10 Essential (primary) hypertension: Secondary | ICD-10-CM | POA: Diagnosis not present

## 2021-02-22 DIAGNOSIS — S61012A Laceration without foreign body of left thumb without damage to nail, initial encounter: Secondary | ICD-10-CM | POA: Diagnosis not present

## 2021-02-22 DIAGNOSIS — Z23 Encounter for immunization: Secondary | ICD-10-CM

## 2021-02-22 NOTE — Progress Notes (Signed)
Phone [phone] In person visit   Subjective:   Theresa [name] is a 87 y.o. year old very pleasant female patient who presents for/with See problem oriented charting Chief Complaint  Patient presents with   cut on left thumb    Pt cut thumb on tuna can yesterday.    This visit occurred during the SARS-CoV-2 public health emergency.  Safety protocols were in place, including screening questions prior to the visit, additional usage of staff PPE, and extensive cleaning of exam room while observing appropriate contact time as indicated for disinfecting solutions.   Past Medical History-  Patient Active Problem List   Diagnosis Date Noted   Balance disorder 05/03/2018    Priority: High   Macular degeneration 03/31/2017    Priority: High   TIA (transient ischemic attack) 03/21/2013    Priority: High   Hx of ischemic left MCA stroke 04/08/2011    Priority: High   Atrial fibrillation ([place]) 07/07/2007    Priority: High   History of mitral valve repair 09/11/2006    Priority: High   Hyperlipidemia 09/01/2014    Priority: Medium    CKD (chronic kidney disease), stage III ([place]) 03/02/2014    Priority: Medium    Major depression in full remission ([place]) 11/23/2013    Priority: Medium    Hypertension 03/04/2012    Priority: Medium    GASTROESOPHAGEAL REFLUX DISEASE, MILD 06/04/2009    Priority: Medium    Hypothyroidism 11/23/2006    Priority: Medium    Osteopenia 11/23/2006    Priority: Medium    Osteoarthritis 09/11/2006    Priority: Medium    Rosacea 11/23/2013    Priority: Low   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 05/30/2011    Priority: Low   PERSONAL HX COLONIC POLYPS 10/22/2009    Priority: Low   CHANGE IN BOWELS 09/20/2009    Priority: Low   Raynaud's syndrome 03/06/2009    Priority: Low   SCOLIOSIS, LUMBAR SPINE 01/03/2009    Priority: Low   Pain due to onychomycosis of toenails of both feet 12/23/2019   Difficulty swallowing pills  11/26/2016    Medications- reviewed and updated Current Outpatient Medications  Medication Sig Dispense Refill   Acetaminophen (TYLENOL ARTHRITIS PAIN PO) Take by mouth.     atorvastatin (LIPITOR) 20 MG tablet Take 1 tablet (20 mg total) by mouth every evening. 90 tablet 1   Calcium Carbonate-Vitamin D 600-400 MG-UNIT tablet Take 1 tablet by mouth 2 (two) times daily.     escitalopram (LEXAPRO) 10 MG tablet TAKE 1 TABLET(10MG  TOTAL) BY MOUTH DAILY 90 tablet 3   glucosamine-chondroitin 500-400 MG tablet Take 1 tablet by mouth 2 (two) times daily.     levothyroxine (SYNTHROID) 75 MCG tablet One tab daily before breakfast 90 tablet 3   metoprolol tartrate (LOPRESSOR) 100 MG tablet Take 1 tablet (100 mg total) by mouth 2 (two) times daily. 180 tablet 2   Multiple Vitamins-Minerals (PRESERVISION AREDS 2 PO) Take 1 tablet by mouth 2 (two) times daily. Reported on 09/04/2015     rosuvastatin (CRESTOR) 20 MG tablet TAKE 1 TABLET BY MOUTH DAILY GENERIC EQUIVALENT FOR CRESTOR 90 tablet 3   vitamin A 10000 UNIT capsule Take 10,000 Units by mouth daily.     XARELTO 20 MG TABS tablet TAKE 1 TABLET BY MOUTH DAILY WITH SUPPER 90 tablet 3   No current facility-administered medications for this visit.     Objective:  BP 120/70   Pulse 70  Temp (!) 97.2 F (36.2 C)   Ht 5' (1.524 m)   Wt 103 lb 3.2 oz (46.8 kg)   LMP  (LMP Unknown)   SpO2 98%   BMI 20.15 kg/m   CV: RRR  Lungs: nonlabored, normal respiratory rate Abdomen: soft/nondistended  Skin: Small laceration under 1.5 cm in length on left thumb distal to IP joint pictured below.  No surrounding erythema, warmth, some tenderness only over the wound itself.  No active bleeding noted      Assessment and Plan   # Cut on thumb S:before lunch yesterday cut her left thumb on sharp tuna can lid- was trying to shake it out and lid still attached and cut her.   Can stop bleeding with cotton but when takes off often rebleeds. She is on xarelo  which increases likelihood of bleeding  A/P: Left thumb laceration that had been bleeding at home-appears very has coagulated despite her taking Xarelto-I think this may have been pulling open each time she took off her dressing but we reviewed how to do this with saline.  Also gave more extensive instructions on after visit summary - Overdue for tetanus and given Td today  # Atrial fibrillation with history of TIA as well as stroke S: Rate controlled with metoprolol 100 mg twice daily Anticoagulated with Xarelto 20 mg A/P: With stroke history I prefer not to take her off of Xarelto unless absolutely necessary-appears to be able to stop the bleeding by simply avoiding pulling on the wound using saline-for now we will continue current medication as appropriately anticoagulated and rate controlled   #hypertension S: medication: Controlled on metoprolol 100 mg twice daily alone BP Readings from Last 3 Encounters:  02/22/21 120/70  11/07/20 135/76  10/31/20 132/72  A/P:  Controlled. Continue current medications.     Recommended follow up: Return for as needed for new, worsening, persistent symptoms.  We particularly reviewed infection risks and reason she may need antibiotics Future Appointments  Date Time Provider [place]  02/25/2021 11:00 AM [facility] CCM PHARMACIST [facility]  05/13/2021 10:40 AM Marin Olp, MD [facility]    Lab/Order associations:   ICD-10-CM   1. Laceration of left thumb without foreign body without damage to nail, initial encounter  S61.012A     2. Essential hypertension  I10     3. Longstanding persistent atrial fibrillation (HCC)  I48.11     4. Need for Td vaccine  Z23 Td : Tetanus/diphtheria >85yo Preservative  free      I,Jada Bradford,acting as a scribe for Garret Reddish, MD.,have documented all relevant documentation on the behalf of Garret Reddish, MD,as directed by  Garret Reddish, MD while in the presence of Garret Reddish, MD.    I, Garret Reddish, MD, have reviewed all documentation for this visit. The documentation on 02/22/21 for the exam, diagnosis, procedures, and orders are all accurate and complete.   Return precautions advised.  Garret Reddish, MD

## 2021-02-22 NOTE — Patient Instructions (Addendum)
Team please log her flu shot  Change dressing daily -You may want to use some saline solution to help get the dressing off if sticking-be very gentle because we want to avoid pulling the skin flap up-your blood thinner increases the risk of bleeding and I do not want to hold this unless we absolutely have to due to stroke history - Can let soapy water run over this then water alone after you get the dressing off and then pat dry. -If recurrent bleeding issues please let us know - If you have redness or swelling or increasing pain let us know immediately-no active infection but there is a risk for that to develop - For dressing apply the antibiotic ointment I gave you first, then nonstick Telfa layer, then 2 x 2 gauze dressing then can wrap the thumb-avoid any trauma to this area  Blood pressure looks good even with the cut/likely frustration from this!  Heart rate looks good as well  Td today under left thumb laceration  Recommended follow up: keep February appointment

## 2021-02-22 NOTE — Progress Notes (Signed)
Chronic Care Management Pharmacy Note  02/25/2021 Name:  Haunani Dickard MRN:  [id number] DOB:  16-Feb-1932  Summary: Initial visit with PharmD.  Patient meds reviewed.  Her main concern today was her balance issues.  She has done PT before and has not noticed much of a difference.  She still has exercises at home she can do.  This slows her down the most and hinders ability to do much exercise/walking.  Patient had emergent visit with PCP due to unresponsiveness during our visit.  Recommendations/Changes made from today's visit: Try at home band exercises for balance again  Plan: FU 4 months   Subjective: Charrise Lardner is an 87 y.o. year old female who is a primary patient of Hunter, Brayton Mars, MD.  The CCM team was consulted for assistance with disease management and care coordination needs.    Engaged with patient face to face for initial visit in response to provider referral for pharmacy case management and/or care coordination services.   Consent to Services:  The patient was given the following information about Chronic Care Management services today, agreed to services, and gave verbal consent: 1. CCM service includes personalized support from designated clinical staff supervised by the primary care provider, including individualized plan of care and coordination with other care providers 2. 24/7 contact phone numbers for assistance for urgent and routine care needs. 3. Service will only be billed when office clinical staff spend 20 minutes or more in a month to coordinate care. 4. Only one practitioner may furnish and bill the service in a calendar month. 5.The patient may stop CCM services at any time (effective at the end of the month) by phone call to the office staff. 6. The patient will be responsible for cost sharing (co-pay) of up to 20% of the service fee (after annual deductible is met). Patient agreed to services and consent obtained.  Patient Care Team: Marin Olp, MD as PCP - General (Family Medicine) Josue Hector, MD as PCP - Cardiology (Cardiology) Shon Hough, MD as Consulting Physician (Ophthalmology) Ricki Miller, Tana Felts, MD as Referring Physician (Ophthalmology) Star Age, MD as Consulting Physician (Neurology) Edythe Clarity, [facility] as Pharmacist (Pharmacist)  Recent office visits:  11/07/2020 OV (PCP) Marin Olp, MD; Discontinued digoxin.   Recent consult visits:  10/31/2020 OV (cardiology) Josue Hector, MD; 1-STOP Digoxin 2-STOP Famotidine 3-STOP Voltaren Gel   Hospital visits:  None in previous 6 months   Objective:  Lab Results  Component Value Date   CREATININE 1.11 11/07/2020   BUN 41 (H) 11/07/2020   GFR 44.73 (L) 11/07/2020   GFRNONAA 65 (L) 03/21/2013   GFRAA 76 (L) 03/21/2013   NA 139 11/07/2020   K 4.7 11/07/2020   CALCIUM 10.6 (H) 11/07/2020   CO2 29 11/07/2020   GLUCOSE 85 11/07/2020    Lab Results  Component Value Date/Time   HGBA1C 5.4 10/05/2013 08:37 AM   HGBA1C 5.5 03/22/2013 05:15 AM   GFR 44.73 (L) 11/07/2020 11:40 AM   GFR 49.68 (L) 05/07/2020 12:04 PM    Last diabetic Eye exam: No results found for: HMDIABEYEEXA  Last diabetic Foot exam: No results found for: HMDIABFOOTEX   Lab Results  Component Value Date   CHOL 123 11/07/2020   HDL 67.60 11/07/2020   LDLCALC 38 11/07/2020   LDLDIRECT 47.0 09/04/2015   TRIG 85.0 11/07/2020   CHOLHDL 2 11/07/2020    Hepatic Function Latest Ref Rng & Units 11/07/2020 05/07/2020 11/02/2019  Total Protein 6.0 - 8.3 g/dL 7.0 6.9 6.7  Albumin 3.5 - 5.2 g/dL 4.5 4.4 -  AST 0 - 37 U/L 26 21 21   ALT 0 - 35 U/L 32 19 23  Alk Phosphatase 39 - 117 U/L 76 82 -  Total Bilirubin 0.2 - 1.2 mg/dL 0.6 0.5 0.5  Bilirubin, Direct 0.0 - 0.3 mg/dL - - -    Lab Results  Component Value Date/Time   TSH 2.29 11/07/2020 11:40 AM   TSH 1.68 05/07/2020 12:04 PM   FREET4 1.84 (H) 01/10/2013 09:38 AM   FREET4 1.04 06/30/2012 09:52 AM     CBC Latest Ref Rng & Units 11/07/2020 05/07/2020 11/02/2019  WBC 4.0 - 10.5 K/uL 7.7 8.3 7.4  Hemoglobin 12.0 - 15.0 g/dL 13.2 13.7 13.5  Hematocrit 36.0 - 46.0 % 39.5 40.3 41.2  Platelets 150.0 - 400.0 K/uL 186.0 191.0 201    Lab Results  Component Value Date/Time   VD25OH 62 06/04/2009 10:01 PM    Clinical ASCVD: Yes  The ASCVD Risk score (Arnett DK, et al., 2019) failed to calculate for the following reasons:   The 2019 ASCVD risk score is only valid for ages 59 to 51    Depression screen PHQ 2/9 12/12/2020 11/07/2020 05/07/2020  Decreased Interest 0 0 0  Down, Depressed, Hopeless 0 0 0  PHQ - 2 Score 0 0 0  Altered sleeping 0 0 -  Tired, decreased energy 0 0 -  Change in appetite 0 0 -  Feeling bad or failure about yourself  0 0 -  Trouble concentrating 0 0 -  Moving slowly or fidgety/restless 0 0 -  Suicidal thoughts 0 0 -  PHQ-9 Score 0 0 -  Difficult doing work/chores Not difficult at all Not difficult at all -  Some recent data might be hidden     Social History   Tobacco Use  Smoking Status Former   Types: Cigarettes   Quit date: 04/28/1966   Years since quitting: 54.8  Smokeless Tobacco Never   BP Readings from Last 3 Encounters:  02/22/21 120/70  11/07/20 135/76  10/31/20 132/72   Pulse Readings from Last 3 Encounters:  02/22/21 70  11/07/20 61  10/31/20 64   Wt Readings from Last 3 Encounters:  02/22/21 103 lb 3.2 oz (46.8 kg)  11/07/20 101 lb 9.6 oz (46.1 kg)  05/07/20 102 lb 9.6 oz (46.5 kg)   BMI Readings from Last 3 Encounters:  02/22/21 20.15 kg/m  11/07/20 19.43 kg/m  10/31/20 20.04 kg/m    Assessment/Interventions: Review of patient past medical history, allergies, medications, health status, including review of consultants reports, laboratory and other test data, was performed as part of comprehensive evaluation and provision of chronic care management services.   SDOH:  (Social Determinants of Health) assessments and interventions  performed: Yes  Financial Resource Strain: Low Risk    Difficulty of Paying Living Expenses: Not hard at all    SDOH Screenings   Alcohol Screen: Low Risk    Last Alcohol Screening Score (AUDIT): 4  Depression (PHQ2-9): Low Risk    PHQ-2 Score: 0  Financial Resource Strain: Low Risk    Difficulty of Paying Living Expenses: Not hard at all  Food Insecurity: No Food Insecurity   Worried About Charity fundraiser in the Last Year: Never true   Ran Out of Food in the Last Year: Never true  Housing: Low Risk    Last Housing Risk Score: 0  Physical Activity:  Inactive   Days of Exercise per Week: 0 days   Minutes of Exercise per Session: 0 min  Social Connections: Unknown   Frequency of Communication with Friends and Family: Once a week   Frequency of Social Gatherings with Friends and Family: Never   Attends Religious Services: Never   Marine scientist or Organizations: No   Attends Music therapist: Never   Marital Status: Not on file  Stress: No Stress Concern Present   Feeling of Stress : Not at all  Tobacco Use: Medium Risk   Smoking Tobacco Use: Former   Smokeless Tobacco Use: Never   Passive Exposure: Not on file  Transportation Needs: No Transportation Needs   Lack of Transportation (Medical): No   Lack of Transportation (Non-Medical): No    CCM Care Plan  Allergies  Allergen Reactions   Rofecoxib     REACTION: Elevated LFT's    Medications Reviewed Today     Reviewed by Angela Adam, CMA (Certified Medical Assistant) on 02/25/21 at 1120  Med List Status: <None>   Medication Order Taking? Sig Documenting Provider Last Dose Status Informant  Acetaminophen (TYLENOL ARTHRITIS PAIN PO) [id number]  Take by mouth. [provider]  Active   atorvastatin (LIPITOR) 20 MG tablet [id number]  Take 1 tablet (20 mg total) by mouth every evening. Marin Olp, MD  Active   Calcium Carbonate-Vitamin D 600-400 MG-UNIT tablet [id number]  Take 1  tablet by mouth 2 (two) times daily. [provider]  Active Self  escitalopram (LEXAPRO) 10 MG tablet [id number]  TAKE 1 TABLET(10MG TOTAL) BY MOUTH DAILY Marin Olp, MD  Active   glucosamine-chondroitin 500-400 MG tablet [id number]  Take 1 tablet by mouth 2 (two) times daily. [provider]  Active Self  levothyroxine (SYNTHROID) 75 MCG tablet [id number]  One tab daily before breakfast Marin Olp, MD  Active   metoprolol tartrate (LOPRESSOR) 100 MG tablet [id number]  Take 1 tablet (100 mg total) by mouth 2 (two) times daily. Marin Olp, MD  Active   Multiple Vitamins-Minerals (PRESERVISION AREDS 2 PO) [id number]  Take 1 tablet by mouth 2 (two) times daily. Reported on 09/04/2015 [provider]  Active Self  rosuvastatin (CRESTOR) 20 MG tablet [id number]  TAKE 1 TABLET BY MOUTH DAILY GENERIC EQUIVALENT FOR CRESTOR Marin Olp, MD  Active   vitamin A 10000 UNIT capsule [id number]  Take 10,000 Units by mouth daily. 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 05/30/2011   Hx of ischemic left MCA stroke 04/08/2011   PERSONAL HX COLONIC POLYPS 10/22/2009   CHANGE IN BOWELS 09/20/2009   GASTROESOPHAGEAL REFLUX DISEASE, MILD 06/04/2009   Raynaud's syndrome 03/06/2009   SCOLIOSIS, LUMBAR SPINE 01/03/2009   Atrial fibrillation ([place])  07/07/2007   Hypothyroidism 11/23/2006   Osteopenia  11/23/2006   History of mitral valve repair 09/11/2006   Osteoarthritis 09/11/2006    Immunization History  Administered Date(s) Administered   Fluad Quad(high Dose 65+) 11/30/2018, 01/17/2020   Influenza Split 12/17/2010, 12/02/2011   Influenza Whole 03/17/2001, 12/18/2006, 12/28/2007, 01/03/2009, 12/26/2009   Influenza, High Dose Seasonal PF 12/22/2012, 03/06/2015, 11/26/2015, 12/09/2017   Influenza,inj,Quad PF,6+ Mos 11/23/2013   Influenza-Unspecified 11/20/2016   PFIZER(Purple Top)SARS-COV-2 Vaccination 06/09/2019, 06/30/2019, 02/16/2020   Pneumococcal Conjugate-13 03/02/2014   Pneumococcal Polysaccharide-23 03/17/2000, 09/16/2006   Td 03/18/1995, 09/15/2006, 02/22/2021   Tdap 12/17/2010   Unspecified SARS-COV-2 Vaccination 12/08/2020   Zoster Recombinat (Shingrix) 09/18/2016, 11/20/2016    Conditions to be addressed/monitored:  Afib, HTN, TIA, Hypothyroidism, Osteopenia, Depression, HLD  Care Plan : General Pharmacy (Adult)  Updates made by Edythe Clarity, RPH since 02/25/2021 12:00 AM     Problem: Afib, HTN, TIA, Hypothyroidism, Osteopenia, Depression, HLD   Priority: High  Onset Date: 02/25/2021     Long-Range Goal: Patient-Specific Goal   Start Date: 02/25/2021  Expected End Date: 08/26/2021  This Visit's Progress: On track  Priority: High  Note:   Current Barriers:  Balance issues unsteady on her feet  Pharmacist Clinical Goal(s):  Patient will achieve improvement in balance as evidenced by symptoms through collaboration with PharmD and provider.   Interventions: 1:1 collaboration with Marin Olp, MD regarding development and update of comprehensive plan of care as evidenced by provider attestation and co-signature Inter-disciplinary care team collaboration (see longitudinal plan of care) Comprehensive medication review performed; medication list updated in electronic medical  record  Hypertension (BP goal <130/80) -Controlled -Current treatment: Metoprolol XL 110m daily -Medications previously tried: digoxin, nebivolol  -Current home readings: not checking at home -Current exercise habits: minimal, balance issues prevent her from doing much walking -Denies hypotensive/hypertensive symptoms -Educated on BP goals and benefits of medications for prevention of heart attack, stroke and kidney damage; Exercise goal of 150 minutes per week; Importance of home blood pressure monitoring; Proper BP monitoring technique; -Counseled to monitor BP at home if able, document, and provide log at future appointments -Recommended to continue current medication Denies any dizziness associated with balance issue - using a cane to walk around.  Hyperlipidemia/ Hx of TIA: (LDL goal < 70) -Controlled -Current treatment: Rosuvastatin 239mdaily -Medications previously tried: Atorvastatin  -Educated on Cholesterol goals;  Benefits of statin for ASCVD risk reduction; Importance of limiting foods high in cholesterol; -Recommended to continue current medication Most recent LDL is excellent  Atrial Fibrillation (Goal: prevent stroke and major bleeding) -Controlled -Current treatment: Rate control: Metoprolol XL 10033mID Anticoagulation: Xarelto 58m22mily -Medications previously tried: warfarin (convenience with monitoring) -Home BP and HR readings: N/A  -Counseled on increased risk of stroke due to Afib and benefits of anticoagulation for stroke prevention; importance of adherence to anticoagulant exactly as prescribed; bleeding risk associated with Xarelto and importance of self-monitoring for signs/symptoms of bleeding; -Recommended to continue current medication Assessed patient finances. Copay is manageable at this time.  Patient became unresponsive for approximately 30 seconds during our visit.  She was unable to respond to my questioning, after a brief period she was  able to state her name, the year, month, day and purpose of her visit.  Urgent appointment with PCP initiated.  Depression/Anxiety (Goal: Minimize symptoms) -Controlled -Current treatment: Escitalopram 10mg6mly -Medications previously tried/failed: none noted -PHQ9:  Flowsheet Row Clinical Support from 12/12/2020 in [place]-9 Total Score 0     -Educated  on Benefits of medication for symptom control Reports she does not get out much due to balance issues and inability to drive.  Her husband drives her most places.  Reports her mood overall is still good. -Recommended to continue current medication  Patient Goals/Self-Care Activities Patient will:  - take medications as prescribed as evidenced by patient report and record review focus on medication adherence by pill box check blood pressure weekly if able, document, and provide at future appointments  Follow Up Plan: The care management team will reach out to the patient again over the next 120 days.         Medication Assistance: None required.  Patient affirms current coverage meets needs.  Compliance/Adherence/Medication fill history: Care Gaps: None  Star-Rating Drugs: Rosuvastatin 47m daily 01/07/21 90ds  Patient's preferred pharmacy is:  [facility] ([address]Phone: [phone]: [phone] [facility] LLCPR[facility] #[place] TMandanKLifecare Behavioral [facility] HospitalAT NS[facility]Phone: [phone]: [phone] [facility]([place]Phone: [phone]: [phone] [place] [address]- [address]neySAvera Tyler HospitalOF [place]  [address]Phone: [phone]: [phone] Uses pill box? Yes Pt endorses 100% compliance  We discussed: Benefits of medication synchronization, packaging and delivery as well as enhanced pharmacist oversight with [facility]. Patient decided to: Continue current medication management strategy  Care Plan and Follow Up Patient Decision:  Patient agrees to Care Plan and Follow-up.  Plan: The care management team will reach out to the patient again over the next 180 days.  CBeverly Milch PharmD Clinical Pharmacist  [phone]

## 2021-02-25 ENCOUNTER — Telehealth: Payer: Self-pay

## 2021-02-25 ENCOUNTER — Ambulatory Visit (INDEPENDENT_AMBULATORY_CARE_PROVIDER_SITE_OTHER): Payer: Medicare Other | Admitting: Family Medicine

## 2021-02-25 ENCOUNTER — Encounter: Payer: Self-pay | Admitting: Family Medicine

## 2021-02-25 ENCOUNTER — Ambulatory Visit: Payer: Medicare Other | Admitting: Pharmacist

## 2021-02-25 ENCOUNTER — Other Ambulatory Visit: Payer: Self-pay

## 2021-02-25 VITALS — BP 152/88 | HR 76 | Ht 60.0 in | Wt 103.0 lb

## 2021-02-25 DIAGNOSIS — R4189 Other symptoms and signs involving cognitive functions and awareness: Secondary | ICD-10-CM

## 2021-02-25 DIAGNOSIS — G459 Transient cerebral ischemic attack, unspecified: Secondary | ICD-10-CM

## 2021-02-25 DIAGNOSIS — I4811 Longstanding persistent atrial fibrillation: Secondary | ICD-10-CM

## 2021-02-25 DIAGNOSIS — E785 Hyperlipidemia, unspecified: Secondary | ICD-10-CM

## 2021-02-25 DIAGNOSIS — I10 Essential (primary) hypertension: Secondary | ICD-10-CM

## 2021-02-25 NOTE — Progress Notes (Signed)
Phone [phone] In person visit   Subjective:   Theresa [name] is a 87 y.o. year old very pleasant female patient who presents for/with See problem oriented charting Chief Complaint  Patient presents with   Follow-up    Slowed speech.    This visit occurred during the SARS-CoV-2 public health emergency.  Safety protocols were in place, including screening questions prior to the visit, additional usage of staff PPE, and extensive cleaning of exam room while observing appropriate contact time as indicated for disinfecting solutions.   Past Medical History-  Patient Active Problem List   Diagnosis Date Noted   Balance disorder 05/03/2018    Priority: High   Macular degeneration 03/31/2017    Priority: High   TIA (transient ischemic attack) 03/21/2013    Priority: High   Hx of ischemic left MCA stroke 04/08/2011    Priority: High   Atrial fibrillation ([place]) 07/07/2007    Priority: High   History of mitral valve repair 09/11/2006    Priority: High   Hyperlipidemia 09/01/2014    Priority: Medium    CKD (chronic kidney disease), stage III ([place]) 03/02/2014    Priority: Medium    Major depression in full remission ([place]) 11/23/2013    Priority: Medium    Hypertension 03/04/2012    Priority: Medium    GASTROESOPHAGEAL REFLUX DISEASE, MILD 06/04/2009    Priority: Medium    Hypothyroidism 11/23/2006    Priority: Medium    Osteopenia 11/23/2006    Priority: Medium    Osteoarthritis 09/11/2006    Priority: Medium    Rosacea 11/23/2013    Priority: Low   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     atorvastatin (LIPITOR) 20 MG tablet Take 1 tablet (20 mg total) by mouth every evening. 90 tablet 1   Calcium Carbonate-Vitamin D 600-400 MG-UNIT tablet Take 1 tablet by mouth 2 (two) times daily.     escitalopram (LEXAPRO) 10 MG tablet TAKE 1 TABLET(10MG  TOTAL) BY MOUTH DAILY 90 tablet 3   glucosamine-chondroitin 500-400 MG tablet Take 1 tablet by mouth 2 (two) times daily.     levothyroxine (SYNTHROID) 75 MCG tablet One tab daily before breakfast 90 tablet 3   metoprolol tartrate (LOPRESSOR) 100 MG tablet Take 1 tablet (100 mg total) by mouth 2 (two) times daily. 180 tablet 2   Multiple Vitamins-Minerals (PRESERVISION AREDS 2 PO) Take 1 tablet by mouth 2 (two) times daily. Reported on 09/04/2015     rosuvastatin (CRESTOR) 20 MG tablet TAKE 1 TABLET BY MOUTH DAILY GENERIC EQUIVALENT FOR CRESTOR 90 tablet 3   vitamin A 10000 UNIT capsule Take 10,000 Units by mouth daily.     XARELTO 20 MG TABS tablet TAKE 1 TABLET BY MOUTH DAILY WITH SUPPER 90 tablet 3   No current facility-administered medications for this visit.     Objective:  BP (!) 152/88   Pulse 76   Ht 5' (1.524 m)   [employer]  103 lb (46.7 kg)   LMP  (LMP Unknown)   SpO2 98%   BMI 20.12 kg/m  Gen: NAD, resting comfortably Oropharynx normal CV: Irregular regular no murmurs rubs or gallops Lungs: CTAB no crackles, wheeze, rhonchi Ext: no edema Neuro: CN II-XII intact, sensation and reflexes normal throughout, 5/5 muscle strength in bilateral upper and lower extremities. Normal finger to nose. Normal rapid alternating movements. No pronator drift. Normal romberg. Normal gait for her- uses her walker      Assessment and Plan   #Less responsive episode S:husband brought patient to clinic this morning- reports she was in her normal state of health- she concurs.   During a CCM visit she  had a short period where the pharmacist felt that she was not as responsive. She reports feeling less confident in her words and wanting to make sure right words came out but reports that is not uncommon for her and she felt normal during that time. No lightheadedness or dizziness during episode. No chest pain or shortness of breat. CMA also came into visit and felt similarly about her responsiveness.   I was called to the room- other than being slightly overwhelmed with 4 people being in the room questioning her she seemed to largely be in her normal state of health. CMA and pharmacist noted that she had improved significantly by that time.   She does have a history of TIA and stroke. She has history of A fib but is on xarelto and metoprolol- HR and oxygen were normal when she was assessed. From prior stroke has lingering numbness in 1st 2 fingers and some balance issues (we have talked about several times- macular degeneration contributes)  By time husband was called she was in normal state of health. He did note when she was walking to the CCM visit she may have ben slightly less stable but she also has history of balance issues after CVA (uses cane)- did not think worse than normal.   No missed medication doses recently particularly the xarelto. She felt a pressure with all the CCM questions today about her meds  No facial or extremity weakness. No slurred words or trouble swallowing. no blurry vision or double vision. No paresthesias. No confusion or word finding difficulties- outside being less responsive during episode  A/P: Patient with a brief period while having a CCM visit of being less responsive/not answering questions quickly.  Outside of this episode no clear evidence of TIA or stroke.  There was no seizure-like activity reported.  Patient with no history of similar episodes-may have simply been overwhelmed/stressed but not entirely clear.  Reassuring neurological exam.  By the time I got  into see the patient she seemed back to her normal state-husband was with her when she got into my office room and reports normal state.  Only concern is blood pressure up slightly but situation was stressful.  I suppose something like absence seizures would be possible but new onset at age 76 would be atypical-instead of going to hospital or doing further work-up at this time she wants to monitor and if has recurrent symptoms agrees to seek care immediately. -She has eaten this morning-doubt hypoglycemia  #Atrial fibrillation S: On Xarelto for anticoagulation.  On metoprolol 100 mg twice a day for rate control. Prior digoxin A/P: appropriately anticoagulate dand rate controlled- reduced risk of stroke as a result- no clear evidence on exam. Question TIA but not overly confident- shed prefer to monitor for recurrence over hospitalization  -  I suppose an alternate rhythm issue is possible but shed prefer to go home unless she has recurrent symptoms can seek care  #balance issues S: ongoing issues A/P: no   obvious worsening- continue to monitor   #Hypertension S: Controlled on metoprolol alone typically. No recent home checks BP Readings from Last 3 Encounters:  02/25/21 (!) 152/88  02/22/21 120/70  11/07/20 135/76  A/P: mild BP elevations but events from today were stressful    #Hyperlipidemia S: Compliant with atorvastatin 20 mg--> rosuvastatin 20 mg Lab Results  Component Value Date   CHOL 123 11/07/2020   HDL 67.60 11/07/2020   LDLCALC 38 11/07/2020   LDLDIRECT 47.0 09/04/2015   TRIG 85.0 11/07/2020   CHOLHDL 2 11/07/2020  A/P: excellent control- continue current meds   Recommended follow up: Keep February visit or sooner if new or worsening symptoms Future Appointments  Date Time Provider [place]  02/25/2021  1:00 PM Marin Olp, MD [facility]  05/13/2021 10:40 AM Marin Olp, MD [facility]    Lab/Order associations: No diagnosis found.  No orders  of the defined types were placed in this encounter.   Return precautions advised.  Garret Reddish, MD

## 2021-02-25 NOTE — Patient Instructions (Addendum)
Health Maintenance Due  Topic Date Due   INFLUENZA VACCINE - team please log this  10/15/2020   COVID-19 Vaccine (5 - Booster for [employer] series)- team please log this 02/02/2021   Watch yourself closely and have your husband monitor as well- if you have recurrent episode of being less responsive lets call 911/proceed to hospital  Blood pressure slightly high today but usually controlled- monitor over next week and update me in a week- I wonder if the stress from events from today driving this up  Recommended follow up: keep February visit

## 2021-02-25 NOTE — Patient Instructions (Addendum)
Visit Information   Goals Addressed             This Visit's Progress    Track and Manage My Blood Pressure-Hypertension       Timeframe:  Long-Range Goal Priority:  High Start Date: 02/25/21                            Expected End Date:  08/26/21                     Follow Up Date 05/26/21    - check blood pressure 3 times per week - choose a place to take my blood pressure (home, clinic or office, retail store) - write blood pressure results in a log or diary    Why is this important?   You won't feel high blood pressure, but it can still hurt your blood vessels.  High blood pressure can cause heart or kidney problems. It can also cause a stroke.  Making lifestyle changes like losing a little weight or eating less salt will help.  Checking your blood pressure at home and at different times of the day can help to control blood pressure.  If the doctor prescribes medicine remember to take it the way the doctor ordered.  Call the office if you cannot afford the medicine or if there are questions about it.     Notes:        Patient Care Plan: General Pharmacy (Adult)     Problem Identified: Afib, HTN, TIA, Hypothyroidism, Osteopenia, Depression, HLD   Priority: High  Onset Date: 02/25/2021     Long-Range Goal: Patient-Specific Goal   Start Date: 02/25/2021  Expected End Date: 08/26/2021  This Visit's Progress: On track  Priority: High  Note:   Current Barriers:  Balance issues unsteady on her feet  Pharmacist Clinical Goal(s):  Patient will achieve improvement in balance as evidenced by symptoms through collaboration with PharmD and provider.   Interventions: 1:1 collaboration with Marin Olp, MD regarding development and update of comprehensive plan of care as evidenced by provider attestation and co-signature Inter-disciplinary care team collaboration (see longitudinal plan of care) Comprehensive medication review performed; medication list updated in  electronic medical record  Hypertension (BP goal <130/80) -Controlled -Current treatment: Metoprolol XL 100mg  daily -Medications previously tried: digoxin, nebivolol  -Current home readings: not checking at home -Current exercise habits: minimal, balance issues prevent her from doing much walking -Denies hypotensive/hypertensive symptoms -Educated on BP goals and benefits of medications for prevention of heart attack, stroke and kidney damage; Exercise goal of 150 minutes per week; Importance of home blood pressure monitoring; Proper BP monitoring technique; -Counseled to monitor BP at home if able, document, and provide log at future appointments -Recommended to continue current medication Denies any dizziness associated with balance issue - using a cane to walk around.  Hyperlipidemia/ Hx of TIA: (LDL goal < 70) -Controlled -Current treatment: Rosuvastatin 20mg  daily -Medications previously tried: Atorvastatin  -Educated on Cholesterol goals;  Benefits of statin for ASCVD risk reduction; Importance of limiting foods high in cholesterol; -Recommended to continue current medication Most recent LDL is excellent  Atrial Fibrillation (Goal: prevent stroke and major bleeding) -Controlled -Current treatment: Rate control: Metoprolol XL 100mg  BID Anticoagulation: Xarelto 20mg  daily -Medications previously tried: warfarin (convenience with monitoring) -Home BP and HR readings: N/A  -Counseled on increased risk of stroke due to Afib and benefits of anticoagulation for stroke prevention;  importance of adherence to anticoagulant exactly as prescribed; bleeding risk associated with Xarelto and importance of self-monitoring for signs/symptoms of bleeding; -Recommended to continue current medication Assessed patient finances. Copay is manageable at this time.  Patient became unresponsive for approximately 30 seconds during our visit.  She was unable to respond to my questioning, after a  brief period she was able to state her name, the year, month, day and purpose of her visit.  Urgent appointment with PCP initiated.  Depression/Anxiety (Goal: Minimize symptoms) -Controlled -Current treatment: Escitalopram 10mg  daily -Medications previously tried/failed: none noted -PHQ9:  Flowsheet Row Clinical Support from 12/12/2020 in [facility]  PHQ-9 Total Score 0     -Educated on Benefits of medication for symptom control Reports she does not get out much due to balance issues and inability to drive.  Her husband drives her most places.  Reports her mood overall is still good. -Recommended to continue current medication  Patient Goals/Self-Care Activities Patient will:  - take medications as prescribed as evidenced by patient report and record review focus on medication adherence by pill box check blood pressure weekly if able, document, and provide at future appointments  Follow Up Plan: The care management team will reach out to the patient again over the next 120 days.        Ms. Belzer was given information about Chronic Care Management services today including:  CCM service includes personalized support from designated clinical staff supervised by her physician, including individualized plan of care and coordination with other care providers 24/7 contact phone numbers for assistance for urgent and routine care needs. Standard insurance, coinsurance, copays and deductibles apply for chronic care management only during months in which we provide at least 20 minutes of these services. Most insurances cover these services at 100%, however patients may be responsible for any copay, coinsurance and/or deductible if applicable. This service may help you avoid the need for more expensive face-to-face services. Only one practitioner may furnish and bill the service in a calendar month. The patient may stop CCM services at any time (effective at the end of  the month) by phone call to the office staff.  Patient agreed to services and verbal consent obtained.   The patient verbalized understanding of instructions, educational materials, and care plan provided today and agreed to receive a mailed copy of patient instructions, educational materials, and care plan.  Telephone follow up appointment with pharmacy team member scheduled for: 6 months  Edythe Clarity, [place], PharmD Clinical Pharmacist  [facility] [phone]

## 2021-02-25 NOTE — Telephone Encounter (Signed)
error 

## 2021-03-08 DIAGNOSIS — H34831 Tributary (branch) retinal vein occlusion, right eye, with macular edema: Secondary | ICD-10-CM | POA: Diagnosis not present

## 2021-04-06 ENCOUNTER — Other Ambulatory Visit: Payer: Self-pay | Admitting: Family Medicine

## 2021-04-08 DIAGNOSIS — H3509 Other intraretinal microvascular abnormalities: Secondary | ICD-10-CM | POA: Diagnosis not present

## 2021-04-08 DIAGNOSIS — H3562 Retinal hemorrhage, left eye: Secondary | ICD-10-CM | POA: Diagnosis not present

## 2021-04-08 DIAGNOSIS — H34831 Tributary (branch) retinal vein occlusion, right eye, with macular edema: Secondary | ICD-10-CM | POA: Diagnosis not present

## 2021-04-08 DIAGNOSIS — H353132 Nonexudative age-related macular degeneration, bilateral, intermediate dry stage: Secondary | ICD-10-CM | POA: Diagnosis not present

## 2021-04-09 ENCOUNTER — Other Ambulatory Visit: Payer: Self-pay

## 2021-04-09 MED ORDER — METOPROLOL TARTRATE 100 MG PO TABS: 100.0000 mg | ORAL_TABLET | Freq: Two times a day (BID) | ORAL | 2 refills | Status: DC

## 2021-04-17 DIAGNOSIS — Z1231 Encounter for screening mammogram for malignant neoplasm of breast: Secondary | ICD-10-CM | POA: Diagnosis not present

## 2021-04-17 LAB — HM MAMMOGRAPHY

## 2021-04-25 DIAGNOSIS — H34831 Tributary (branch) retinal vein occlusion, right eye, with macular edema: Secondary | ICD-10-CM | POA: Diagnosis not present

## 2021-05-08 DIAGNOSIS — H3562 Retinal hemorrhage, left eye: Secondary | ICD-10-CM | POA: Diagnosis not present

## 2021-05-13 ENCOUNTER — Ambulatory Visit (INDEPENDENT_AMBULATORY_CARE_PROVIDER_SITE_OTHER): Payer: Medicare Other | Admitting: Family Medicine

## 2021-05-13 ENCOUNTER — Other Ambulatory Visit: Payer: Self-pay

## 2021-05-13 ENCOUNTER — Encounter: Payer: Self-pay | Admitting: Family Medicine

## 2021-05-13 VITALS — BP 124/82 | HR 79 | Temp 97.2°F | Ht 60.0 in | Wt 102.2 lb

## 2021-05-13 DIAGNOSIS — N183 Chronic kidney disease, stage 3 unspecified: Secondary | ICD-10-CM | POA: Diagnosis not present

## 2021-05-13 DIAGNOSIS — M19042 Primary osteoarthritis, left hand: Secondary | ICD-10-CM

## 2021-05-13 DIAGNOSIS — F3342 Major depressive disorder, recurrent, in full remission: Secondary | ICD-10-CM

## 2021-05-13 DIAGNOSIS — M19041 Primary osteoarthritis, right hand: Secondary | ICD-10-CM | POA: Diagnosis not present

## 2021-05-13 DIAGNOSIS — I4811 Longstanding persistent atrial fibrillation: Secondary | ICD-10-CM

## 2021-05-13 DIAGNOSIS — E034 Atrophy of thyroid (acquired): Secondary | ICD-10-CM

## 2021-05-13 DIAGNOSIS — E785 Hyperlipidemia, unspecified: Secondary | ICD-10-CM | POA: Diagnosis not present

## 2021-05-13 DIAGNOSIS — I10 Essential (primary) hypertension: Secondary | ICD-10-CM

## 2021-05-13 LAB — COMPREHENSIVE METABOLIC PANEL
ALT: 22 U/L (ref 0–35)
AST: 21 U/L (ref 0–37)
Albumin: 4.6 g/dL (ref 3.5–5.2)
Alkaline Phosphatase: 83 U/L (ref 39–117)
BUN: 43 mg/dL — ABNORMAL HIGH (ref 6–23)
CO2: 30 mEq/L (ref 19–32)
Calcium: 10.3 mg/dL (ref 8.4–10.5)
Chloride: 102 mEq/L (ref 96–112)
Creatinine, Ser: 1.12 mg/dL (ref 0.40–1.20)
GFR: 44.09 mL/min — ABNORMAL LOW (ref >60.00)
Glucose, Bld: 95 mg/dL (ref 70–99)
Potassium: 5.2 mEq/L — ABNORMAL HIGH (ref 3.5–5.1)
Sodium: 137 mEq/L (ref 135–145)
Total Bilirubin: 0.6 mg/dL (ref 0.2–1.2)
Total Protein: 6.7 g/dL (ref 6.0–8.3)

## 2021-05-13 MED ORDER — ROSUVASTATIN CALCIUM 10 MG PO TABS: 10.0000 mg | ORAL_TABLET | Freq: Every day | ORAL | 3 refills | Status: DC

## 2021-05-13 NOTE — Progress Notes (Signed)
Phone [phone] In person visit   Subjective:   Theresa [name] is a 88 y.o. year old very pleasant female patient who presents for/with See problem oriented charting Chief Complaint  Patient presents with   Follow-up   Hypertension   Hypothyroidism   Hyperlipidemia   Depression   Gastroesophageal Reflux   Dizziness    Pt c/o lightheadedness/dizziness that has been going on for a while.   This visit occurred during the SARS-CoV-2 public health emergency.  Safety protocols were in place, including screening questions prior to the visit, additional usage of staff PPE, and extensive cleaning of exam room while observing appropriate contact time as indicated for disinfecting solutions.   Past Medical History-  Patient Active Problem List   Diagnosis Date Noted   Balance disorder 05/03/2018    Priority: High   Macular degeneration 03/31/2017    Priority: High   TIA (transient ischemic attack) 03/21/2013    Priority: High   Hx of ischemic left MCA stroke 04/08/2011    Priority: High   Atrial fibrillation ([place]) 07/07/2007    Priority: High   History of mitral valve repair 09/11/2006    Priority: High   Hyperlipidemia 09/01/2014    Priority: Medium    CKD (chronic kidney disease), stage III ([place]) 03/02/2014    Priority: Medium    Major depression in full remission ([place]) 11/23/2013    Priority: Medium    Hypertension 03/04/2012    Priority: Medium    GASTROESOPHAGEAL REFLUX DISEASE, MILD 06/04/2009    Priority: Medium    Hypothyroidism 11/23/2006    Priority: Medium    Osteopenia 11/23/2006    Priority: Medium    Osteoarthritis 09/11/2006    Priority: Medium    Rosacea 11/23/2013    Priority: Low   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     Calcium Carbonate-Vitamin D 600-400 MG-UNIT tablet Take 1 tablet by mouth 2 (two) times daily.     escitalopram (LEXAPRO) 10 MG tablet TAKE 1 TABLET(10MG TOTAL) BY MOUTH DAILY 90 tablet 3   glucosamine-chondroitin 500-400 MG tablet Take 1 tablet by mouth 2 (two) times daily.     levothyroxine (SYNTHROID) 75 MCG tablet TAKE 1 TABLET BY MOUTH EVERY DAY BEFORE BREAKFAST 90 tablet 3   metoprolol tartrate (LOPRESSOR) 100 MG tablet Take 1 tablet (100 mg total) by mouth 2 (two) times daily. 180 tablet 2   Multiple Vitamins-Minerals (PRESERVISION AREDS 2 PO) Take 1 tablet by mouth 2 (two) times daily. Reported on 09/04/2015     vitamin A 10000 UNIT capsule Take 10,000 Units by mouth daily.     XARELTO 20 MG TABS tablet TAKE 1 TABLET BY MOUTH DAILY WITH SUPPER 90 tablet 3   rosuvastatin (CRESTOR) 10 MG tablet Take 1 tablet (10 mg total) by mouth daily. 90 tablet 3   No current facility-administered medications for this visit.     Objective:  BP 124/82  Pulse 79    Temp (!) 97.2 F (36.2 C)    Ht 5' (1.524 m)    Wt 102 lb 3.2 oz (46.4 kg)    LMP  (LMP Unknown)    SpO2 100%    BMI 19.96 kg/m  Gen: NAD, resting comfortably CV: appears regular rate but irregularly irregular today no murmurs rubs or gallops Lungs: CTAB no crackles, wheeze, rhonchi Ext: no edema Skin: warm, dry Neuro: appears to have balance issue when stands- holds onto chair- can walk some In the room without cane- but at one point appeared off balanced    Assessment and Plan   #Balance concerns/dizziness S: Note from Dr. Johnsie Cancel "Would benefit from PT/OT referral for balance . For her balance I discussed with her that vestibular  rehabilitation with physical therapy would be an option to help improve her balance. There is no medication that will improve her balance and discussed with her concerning consideration of vestibular rehab." -Last referred to home health physical therapy on 11/07/2019- was not very helpful  Today patient reports-ongoing issues. She did have a fall the night after a laser procedure on her eye- felt more dizzy that evening- no injury at that time- she called optho next day. She was using her cane with the episode. Was advised to follow up with [place].  - she has had a few more dizzy episodes but none as pronounced- usually with standing. Tries to hold onto cane -also has walker available A/P: I encouraged her to use walker especially within first minute of standing which is highest risk time for her or hold onto chair. Also stay well hydrated  -could reduce metoprolol but want to hold off unless Dr. Johnsie Cancel suggests plus BP higher in the past   #Atrial fibrillation S: On Xarelto 20 mg for anticoagulation. On metoprolol 100 mg twice a day for rate control. prior digoxin.  A/P: Appropriately anticoagulated and rate controlled-continue current medication  #Hypertension S: Controlled on metoprolol alone 100 mg twice daily Home readings #s: does not check BP Readings from Last 3 Encounters:  05/13/21 124/82  02/25/21 (!) 152/88  02/22/21 120/70  A/P:  Controlled. Continue current medications.    #Hypothyroidism S: Compliant with levothyroxine 75 mcg every day Lab Results  Component Value Date   TSH 2.29 11/07/2020   A/P:  hopefully stable- update TSH today. Continue current meds for now   #CKD stage III #osteoarthritis hands/elbows. voltaren failure in past- doing better now.  S: Compliant with blood pressure medications. Sparing etodolac in the past-now offs with CKD and Xarelto use. A/P: Hopefully CKD stage III stable-update CMP with labs -Thankfully avoiding NSAIDs for  arthritis  #Hyperlipidemia with history of stroke S: Compliant with atorvastatin 20 mg--> rosuvastatin 20 mg daily Lab Results  Component Value Date   CHOL 123 11/07/2020   HDL 67.60 11/07/2020   LDLCALC 38 11/07/2020   LDLDIRECT 47.0 09/04/2015   TRIG 85.0 11/07/2020   CHOLHDL 2 11/07/2020   A/P: cholesterol is very well controlled- honestly think she would be ok with 10 mg dose- we reduced rosuvastatin from 40m to 10 mg- she will take half of her 238mtablets until she runs out then can reach out ot mail order to fill the 10 mg I sent in today- recheck cholesterol next visit  # Depression S: compliant with lexapro 1070maily Depression screen [facility] 05/13/2021 12/12/2020 11/07/2020  Decreased Interest 0 0 0  Down, Depressed, Hopeless 0 0 0  PHQ - 2  Score 0 0 0  Altered sleeping 0 0 0  Tired, decreased energy 0 0 0  Change in appetite 0 0 0  Feeling bad or failure about yourself  0 0 0  Trouble concentrating 0 0 0  Moving slowly or fidgety/restless 0 0 0  Suicidal thoughts 0 0 0  PHQ-9 Score 0 0 0  Difficult doing work/chores Not difficult at all Not difficult at all Not difficult at all  Some recent data might be hidden  A/P: Appears to be in full remission-continue current medication-discussed option of trialing 5 mg- she prefers to hold off  #Osteopenia-January 2019 bone density largely stable. She takes calcium and vitamin D and wanted to hold off on medicine unless worsened numbers-her numbers were within 2% change on November 12, 2020 and she has wanted to maintain off of medication unless worsens-repeat in 2 to 3 years   Recommended follow up: Return in about 6 months (around 11/10/2021) for physical or sooner if needed. Future Appointments  Date Time Provider [place]  08/27/2021  3:45 PM [facility] CCM PHARMACIST [facility]   Lab/Order associations:   ICD-10-CM   1. Recurrent major depressive disorder, in full remission ([place])  F33.42     2. Hyperlipidemia,  unspecified hyperlipidemia type  E78.5 Comp Met (CMET)    3. Stage 3 chronic kidney disease, unspecified whether stage 3a or 3b CKD (HCC)  N18.30     4. Primary osteoarthritis of both hands  M19.041    M19.042     5. Hypothyroidism due to acquired atrophy of thyroid  E03.4 TSH    6. Primary hypertension  I10     7. Longstanding persistent atrial fibrillation (HCC)  I48.11      Meds ordered this encounter  Medications   rosuvastatin (CRESTOR) 10 MG tablet    Sig: Take 1 tablet (10 mg total) by mouth daily.    Dispense:  90 tablet    Refill:  3    Do not fill until patient reaches out- is going to use half of 20 mg tablet   I,Jada Bradford,acting as a scribe for Garret Reddish, MD.,have documented all relevant documentation on the behalf of Garret Reddish, MD,as directed by  Garret Reddish, MD while in the presence of Garret Reddish, MD.  I, Garret Reddish, MD, have reviewed all documentation for this visit. The documentation on 05/13/21 for the exam, diagnosis, procedures, and orders are all accurate and complete.   Return precautions advised.  Garret Reddish, MD

## 2021-05-13 NOTE — Patient Instructions (Addendum)
cholesterol is very well controlled- honestly think she would be ok with 10 mg dose- we reduced rosuvastatin from 20mg  to 10 mg- she will take half of her 20mg  tablets until she runs out then can reach out ot mail order to fill the 10 mg I sent in today- recheck cholesterol next visit  Consider using a walker for at least first minute after standing since that seems to be the highest risk time for you  Please stop by lab before you go If you have mychart- we will send your results within 3 business days of [place] receiving them.  If you do not have mychart- we will call you about results within 5 business days of [place] receiving them.  *please also note that you will see labs on mychart as soon as they post. I will later go in and write notes on them- will say "notes from Dr. Yong Channel"   Recommended follow up: Return in about 6 months (around 11/10/2021) for physical or sooner if needed.

## 2021-05-14 LAB — TSH: TSH: 1.15 u[IU]/mL (ref 0.35–5.50)

## 2021-06-03 ENCOUNTER — Other Ambulatory Visit: Payer: Self-pay | Admitting: Family Medicine

## 2021-06-20 DIAGNOSIS — H34831 Tributary (branch) retinal vein occlusion, right eye, with macular edema: Secondary | ICD-10-CM | POA: Diagnosis not present

## 2021-07-24 DIAGNOSIS — H52203 Unspecified astigmatism, bilateral: Secondary | ICD-10-CM | POA: Diagnosis not present

## 2021-07-30 ENCOUNTER — Telehealth: Payer: Self-pay | Admitting: Pharmacist

## 2021-07-30 NOTE — Progress Notes (Signed)
? ? ?Chronic Care Management ?Pharmacy Assistant  ? ?Name: Theresa [name]  MRN: [id number] DOB: Jan 06, 1932 ? ? ?Reason for Encounter: General Adherence Call ?  ? ?Recent office visits:  ?05/13/2021 [facility] (PCP) Theresa Olp, MD; we reduced rosuvastatin from '20mg'$  to 10 mg- she will take half of her '20mg'$  tablets until she runs out then can reach out ot mail order to fill the 10 mg I sent in today ? ?02/25/2021 [facility] (PCP) Theresa Olp, MD; no medication changes indicated. ? ?Recent consult visits:  ?None ? ?Hospital visits:  ?None in previous 6 months ? ?Medications: ?Outpatient Encounter Medications as of 07/30/2021  ?Medication Sig  ? Acetaminophen (TYLENOL ARTHRITIS PAIN PO) Take by mouth.  ? Calcium Carbonate-Vitamin D 600-400 MG-UNIT tablet Take 1 tablet by mouth 2 (two) times daily.  ? escitalopram (LEXAPRO) 10 MG tablet TAKE 1 TABLET('10MG'$  TOTAL) BY MOUTH DAILY  ? glucosamine-chondroitin 500-400 MG tablet Take 1 tablet by mouth 2 (two) times daily.  ? levothyroxine (SYNTHROID) 75 MCG tablet TAKE 1 TABLET BY MOUTH EVERY DAY BEFORE BREAKFAST  ? metoprolol tartrate (LOPRESSOR) 100 MG tablet Take 1 tablet (100 mg total) by mouth 2 (two) times daily.  ? Multiple Vitamins-Minerals (PRESERVISION AREDS 2 PO) Take 1 tablet by mouth 2 (two) times daily. Reported on 09/04/2015  ? rosuvastatin (CRESTOR) 10 MG tablet Take 1 tablet (10 mg total) by mouth daily.  ? vitamin A 10000 UNIT capsule Take 10,000 Units by mouth daily.  ? XARELTO 20 MG TABS tablet TAKE 1 TABLET BY MOUTH DAILY WITH SUPPER  ? ?No facility-administered encounter medications on file as of 07/30/2021.  ? ?Contacted Theresa [name] for General Review Call ? ? ?Chart Review: ? ?Have there been any documented new, changed, or discontinued medications since last visit? Yes  ?Has there been any documented recent hospitalizations or ED visits since last visit with Clinical Pharmacist? No ?Brief Summary: Rosuvastatin was decreased from 20 mg to 10  mg ? ?Adherence Review: ? ?Does the Clinical Pharmacist Assistant have access to adherence rates? Yes ?Adherence rates for STAR metric medications: ?Rosuvastatin 10 mg last filled 05/13/2021 90 DS ?Does the patient have >5 day gap between last estimated fill dates for any of the above medications or other medication gaps? No ?Reason for medication gaps. ? ? ?Disease State Questions: ? ?Able to connect with Patient? Yes ?Did patient have any problems with their health recently? Yes ?Note problems and Concerns: Patient states she has had some pain and swelling in her feet. She also says that her feet feel numb. She would like to know if there is anything she can do to improve this. ?Have you had any admissions or emergency room visits or worsening of your condition(s) since last visit? No ?Have you had any visits with new specialists or providers since your last visit? No ?Have you had any new health care problem(s) since your last visit? Yes , patient states her feet are painful and have been swelling some. She reports the right worse than the left. ?Have you run out of any of your medications since you last spoke with clinical pharmacist? No ?Are there any medications you are not taking as prescribed? No ?Are you having any issues or side effects with your medications? No ?Do you have any other health concerns or questions you want to discuss with your Clinical Pharmacist before your next visit? No ?Are there any health concerns that you feel we can do a better job addressing?  No ?Are you having any problems with any of the following since the last visit:  ? None ?12. Any falls since last visit? No ?13. Any increased or uncontrolled pain since last visit? Yes ? Details: Increased pain and numbness in her feet. ? ?Care Gaps: ?Medicare Annual Wellness: Completed 12/12/2020 ?Hemoglobin A1C: 5.4% on 10/05/2013 ?Colonoscopy: Completed 09/27/2009 ?Dexa Scan: Completed ?Mammogram: Completed 04/17/2021 ?HPV Vaccines: Aged  Out ? ?Future Appointments  ?Date Time Provider [place]  ?10/29/2021 10:30 AM [facility] CCM PHARMACIST [facility]  ?11/25/2021 11:00 AM Theresa Channel Brayton Mars, MD [facility]  ? ?Star Rating Drugs: ?Rosuvastatin 10 mg last filled 05/13/2021 90 DS ? ?April D Calhoun, [place] ?Clinical Pharmacist Assistant ?[phone] ?

## 2021-08-15 DIAGNOSIS — H34831 Tributary (branch) retinal vein occlusion, right eye, with macular edema: Secondary | ICD-10-CM | POA: Diagnosis not present

## 2021-08-27 ENCOUNTER — Telehealth: Payer: Medicare Other

## 2021-09-23 DIAGNOSIS — H35363 Drusen (degenerative) of macula, bilateral: Secondary | ICD-10-CM | POA: Diagnosis not present

## 2021-09-23 DIAGNOSIS — H34831 Tributary (branch) retinal vein occlusion, right eye, with macular edema: Secondary | ICD-10-CM | POA: Diagnosis not present

## 2021-09-23 DIAGNOSIS — H353132 Nonexudative age-related macular degeneration, bilateral, intermediate dry stage: Secondary | ICD-10-CM | POA: Diagnosis not present

## 2021-09-23 DIAGNOSIS — H3561 Retinal hemorrhage, right eye: Secondary | ICD-10-CM | POA: Diagnosis not present

## 2021-09-28 ENCOUNTER — Other Ambulatory Visit: Payer: Self-pay | Admitting: Family Medicine

## 2021-10-10 DIAGNOSIS — H34831 Tributary (branch) retinal vein occlusion, right eye, with macular edema: Secondary | ICD-10-CM | POA: Diagnosis not present

## 2021-10-21 ENCOUNTER — Telehealth: Payer: Self-pay | Admitting: Family Medicine

## 2021-10-21 NOTE — Telephone Encounter (Signed)
Theresa [name] requests to be called at ph# [phone] to be advised if it is okay for Theresa [name] to take Vitamin Supplement Nutrafall (fto promote hair growth). Theresa [name] states she wants to make sure she can take Nutrafall while taking a blood thinner (Carelto). Theresa [name] states Nutrfaall advises Theresa [name] to check with PCP.

## 2021-10-21 NOTE — Telephone Encounter (Signed)
See below

## 2021-10-21 NOTE — Telephone Encounter (Signed)
I would probably avoid this-there are multiple ingredients that I cannot interaction check with her Xarelto

## 2021-10-22 NOTE — Telephone Encounter (Signed)
Called and spoke with pt and below message given. 

## 2021-10-29 ENCOUNTER — Telehealth: Payer: Medicare Other

## 2021-11-25 ENCOUNTER — Ambulatory Visit (INDEPENDENT_AMBULATORY_CARE_PROVIDER_SITE_OTHER): Payer: Medicare Other | Admitting: Family Medicine

## 2021-11-25 ENCOUNTER — Encounter: Payer: Self-pay | Admitting: Family Medicine

## 2021-11-25 VITALS — BP 122/64 | HR 60 | Temp 97.7°F | Ht 60.0 in | Wt 97.8 lb

## 2021-11-25 DIAGNOSIS — E785 Hyperlipidemia, unspecified: Secondary | ICD-10-CM | POA: Diagnosis not present

## 2021-11-25 DIAGNOSIS — R202 Paresthesia of skin: Secondary | ICD-10-CM

## 2021-11-25 DIAGNOSIS — Z0000 Encounter for general adult medical examination without abnormal findings: Secondary | ICD-10-CM | POA: Diagnosis not present

## 2021-11-25 DIAGNOSIS — E034 Atrophy of thyroid (acquired): Secondary | ICD-10-CM | POA: Diagnosis not present

## 2021-11-25 DIAGNOSIS — I4811 Longstanding persistent atrial fibrillation: Secondary | ICD-10-CM | POA: Diagnosis not present

## 2021-11-25 LAB — CBC WITH DIFFERENTIAL/PLATELET
Basophils Absolute: 0.1 10*3/uL (ref 0.0–0.1)
Basophils Relative: 1 % (ref 0.0–3.0)
Eosinophils Absolute: 0.1 10*3/uL (ref 0.0–0.7)
Eosinophils Relative: 1.9 % (ref 0.0–5.0)
HCT: 40.4 % (ref 36.0–46.0)
Hemoglobin: 13.4 g/dL (ref 12.0–15.0)
Lymphocytes Relative: 27.6 % (ref 12.0–46.0)
Lymphs Abs: 2 10*3/uL (ref 0.7–4.0)
MCHC: 33 g/dL (ref 30.0–36.0)
MCV: 98.5 fl (ref 78.0–100.0)
Monocytes Absolute: 0.5 10*3/uL (ref 0.1–1.0)
Monocytes Relative: 7.4 % (ref 3.0–12.0)
Neutro Abs: 4.6 10*3/uL (ref 1.4–7.7)
Neutrophils Relative %: 62.1 % (ref 43.0–77.0)
Platelets: 179 10*3/uL (ref 150.0–400.0)
RBC: 4.1 Mil/uL (ref 3.87–5.11)
RDW: 13.6 % (ref 11.5–15.5)
WBC: 7.4 10*3/uL (ref 4.0–10.5)

## 2021-11-25 LAB — LIPID PANEL
Cholesterol: 142 mg/dL (ref 0–200)
HDL: 69.4 mg/dL (ref >39.00)
LDL Cholesterol: 58 mg/dL (ref 0–99)
NonHDL: 72.57
Total CHOL/HDL Ratio: 2
Triglycerides: 72 mg/dL (ref 0.0–149.0)
VLDL: 14.4 mg/dL (ref 0.0–40.0)

## 2021-11-25 LAB — COMPREHENSIVE METABOLIC PANEL
ALT: 25 U/L (ref 0–35)
AST: 24 U/L (ref 0–37)
Albumin: 4.3 g/dL (ref 3.5–5.2)
Alkaline Phosphatase: 71 U/L (ref 39–117)
BUN: 36 mg/dL — ABNORMAL HIGH (ref 6–23)
CO2: 31 mEq/L (ref 19–32)
Calcium: 9.9 mg/dL (ref 8.4–10.5)
Chloride: 103 mEq/L (ref 96–112)
Creatinine, Ser: 1.13 mg/dL (ref 0.40–1.20)
GFR: 43.46 mL/min — ABNORMAL LOW (ref >60.00)
Glucose, Bld: 83 mg/dL (ref 70–99)
Potassium: 5 mEq/L (ref 3.5–5.1)
Sodium: 139 mEq/L (ref 135–145)
Total Bilirubin: 0.5 mg/dL (ref 0.2–1.2)
Total Protein: 6.7 g/dL (ref 6.0–8.3)

## 2021-11-25 LAB — TSH: TSH: 0.88 u[IU]/mL (ref 0.35–5.50)

## 2021-11-25 LAB — VITAMIN B12: Vitamin B-12: 996 pg/mL — ABNORMAL HIGH (ref 211–911)

## 2021-11-25 NOTE — Patient Instructions (Addendum)
Flu shot (high dose) we should have these available within a month or two but please let us know if you get at outside pharmacy - consider new covid shot in October  If legs get worse let us know- we can do that neurology referral  Lets have you try to increase premier to another dose each day- eat as much as you feel comfortable as welL! Want to keep your weight up  Please stop by lab before you go If you have mychart- we will send your results within 3 business days of [place] receiving them.  If you do not have mychart- we will call you about results within 5 business days of [place] receiving them.  *please also note that you will see labs on mychart as soon as they post. I will later go in and write notes on them- will say "notes from Dr. Yong Channel"    Recommended follow up: Return in about 6 months (around 05/26/2022) for followup or sooner if needed.Schedule b4 you leave.

## 2021-11-25 NOTE — Progress Notes (Signed)
Phone [phone]   Subjective:  Patient presents today for their annual physical. Chief complaint-noted.   See problem oriented charting- ROS- full  review of systems was completed and negative except for: vision issues, balance issues, leg numbness and tingling- worse on rihgt,  joint pain  The following were reviewed and entered/updated in epic: Past Medical History:  Diagnosis Date   Atrial fibrillation ([place])    AFIB   Collagenous colitis    CVA (cerebral vascular accident) ([place])    a.  L parietal CVA by MRI 41/93, likely embolic;  b.  TEE by report with neg bubble study;  c.  carotid dopplers  12/12:  + plaque, no sig ICA stenosis    History of postmenopausal HRT    Hx of adenomatous colonic polyps    Hypothyroidism    MVP (mitral valve prolapse)    a. s/p MV repair at [facility] in 2008;  b. Echocardiogram 7/12: EF 50%, status post mitral valve repair with mild MR and minimal MS, mean gradient 4, moderate LAE, LA diam 55 mm; mild RAE, PASP 28-32;    Osteoarthritis    Osteoporosis    Patient Active Problem List   Diagnosis Date Noted   Balance disorder 05/03/2018    Priority: High   Macular degeneration 03/31/2017    Priority: High   TIA (transient ischemic attack) 03/21/2013    Priority: High   Hx of ischemic left MCA stroke 04/08/2011    Priority: High   Atrial fibrillation ([place]) 07/07/2007    Priority: High   History of mitral valve repair 09/11/2006    Priority: High   Hyperlipidemia 09/01/2014    Priority: Medium    CKD (chronic kidney disease), stage III ([place]) 03/02/2014    Priority: Medium    Major depression in full remission ([place]) 11/23/2013    Priority: Medium    Hypertension 03/04/2012    Priority: Medium    GASTROESOPHAGEAL REFLUX DISEASE, MILD 06/04/2009    Priority: Medium    Hypothyroidism 11/23/2006    Priority: Medium    Osteopenia 11/23/2006    Priority: Medium    Osteoarthritis 09/11/2006    Priority: Medium    Rosacea 11/23/2013     Priority: Low   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 05/30/2011    Priority: Low   PERSONAL HX COLONIC POLYPS 10/22/2009    Priority: Low   CHANGE IN BOWELS 09/20/2009    Priority: Low   Raynaud's syndrome 03/06/2009    Priority: Low   SCOLIOSIS, LUMBAR SPINE 01/03/2009    Priority: Low   Pain due to onychomycosis of toenails of both feet 12/23/2019   Difficulty swallowing pills 11/26/2016   Past Surgical History:  Procedure Laterality Date   CARDIOVERSION  01/09/2012   Procedure: CARDIOVERSION;  Surgeon: Jolaine Artist, MD;  Location: [facility] ENDOSCOPY;  Service: Cardiovascular;  Laterality: N/A;   CATARACT EXTRACTION     CHOLECYSTECTOMY     DILATION AND CURETTAGE OF UTERUS     LUMBAR FUSION     MITRAL VALVE REPAIR  2008   TONSILLECTOMY      Family History  Problem Relation Age of Onset   Cancer Father        COLON   Stroke Neg Hx        1ST DEGREE RELATIVE <60    Medications- reviewed and updated Current Outpatient Medications  Medication Sig Dispense Refill   Acetaminophen (TYLENOL ARTHRITIS PAIN PO) Take by mouth.     Calcium Carbonate-Vitamin  D 600-400 MG-UNIT tablet Take 1 tablet by mouth 2 (two) times daily.     escitalopram (LEXAPRO) 10 MG tablet TAKE 1 TABLET BY MOUTH DAILY 90 tablet 3   glucosamine-chondroitin 500-400 MG tablet Take 1 tablet by mouth 2 (two) times daily.     levothyroxine (SYNTHROID) 75 MCG tablet TAKE 1 TABLET BY MOUTH EVERY DAY BEFORE BREAKFAST 90 tablet 3   metoprolol tartrate (LOPRESSOR) 100 MG tablet Take 1 tablet (100 mg total) by mouth 2 (two) times daily. 180 tablet 2   Multiple Vitamins-Minerals (PRESERVISION AREDS 2 PO) Take 1 tablet by mouth 2 (two) times daily. Reported on 09/04/2015     rosuvastatin (CRESTOR) 10 MG tablet Take 1 tablet (10 mg total) by mouth daily. 90 tablet 3   vitamin A 10000 UNIT capsule Take 10,000 Units by mouth daily.     XARELTO 20 MG TABS tablet TAKE 1 TABLET BY MOUTH DAILY WITH SUPPER 90  tablet 3   No current facility-administered medications for this visit.    Allergies-reviewed and updated Allergies  Allergen Reactions   Rofecoxib     REACTION: Elevated LFT's    Social History   Social History Narrative   Married for 67 years-1st marriage for both. No kids. No pets.       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 Oropharynx normal Neck: no thyromegaly VV:[id number] irregular no murmurs rubs or gallops Lungs: CTAB no crackles, wheeze, rhonchi Abdomen: soft/nontender/nondistended/normal bowel sounds. No rebound or guarding.  Ext: no edema, 1+ DP and PT pulses right foot. Right shin and soft tissu seems sensitive diffusely compared to left- mild tenderness on left Skin: warm, dry Neuro: grossly normal, moves all extremities, PERRLA   Assessment and Plan   88 y.o. female presenting for annual physical.  Health Maintenance counseling: 1. Anticipatory guidance: Patient counseled regarding regular dental exams -q6 months, eye exams-follows with specialist Dr. Posey Pronto for macular degeneration and receives injections regularly,  avoiding smoking and second hand smoke , limiting alcohol to 1 beverage per day-2 ounces of red wine per day, no illicit drugs .   2. Risk factor reduction:  Advised patient of need for regular exercise and diet rich and fruits and vegetables to reduce risk of heart attack and stroke.  Exercise- hard with balance- tries to move around house as best she can. Encouraged chair exercises.  Diet/weight management-low appetite and weight decreasing  some- encouraged to continue premier protein at least once a day- could consider twice a day.  Wt Readings from Last 3 Encounters:  11/25/21 97 lb 12.8 oz (44.4 kg)  05/13/21 102 lb 3.2 oz (46.4 kg)  02/25/21 103 lb (46.7 kg)  3. Immunizations/screenings/ancillary studies-recommended for flu and COVID shot . Consider RSV at phramacy Immunization History  Administered Date(s) Administered   Fluad Quad(high Dose 65+) 11/30/2018, 01/17/2020   Influenza Split 12/17/2010, 12/02/2011   Influenza Whole 03/17/2001, 12/18/2006, 12/28/2007, 01/03/2009, 12/26/2009   Influenza, High  Dose Seasonal PF 12/22/2012, 03/06/2015, 11/26/2015, 12/09/2017   Influenza,inj,Quad PF,6+ Mos 11/23/2013   Influenza-Unspecified 11/20/2016   PFIZER(Purple Top)SARS-COV-2 Vaccination 06/09/2019, 06/30/2019, 02/16/2020   Pneumococcal Conjugate-13 03/02/2014   Pneumococcal Polysaccharide-23 03/17/2000, 09/16/2006   Td 03/18/1995, 09/15/2006, 02/22/2021   Tdap 12/17/2010   Unspecified SARS-COV-2 Vaccination 12/08/2020   Zoster Recombinat (Shingrix) 09/18/2016, 11/20/2016   4. Cervical cancer screening- past age based screening recommendations. 5. Breast cancer screening-prefers to continue mammograms due to dense breasts-most recently 04/17/21 6. Colon cancer screening -past age based screening recommendations. No blood in stool or melena 7. Skin cancer screening-  Last year recommended scheduling follow-up with Dr. Ledell Peoples office- encouraged again today. advised regular sunscreen use. Denies worrisome, changing, or new skin lesions.  8. Birth control/STD check-monogamous/postmenopausal 9. Osteoporosis screening at 65-see results below 10. Smoking associated screening -former smoker-quit in 1960s-no regular screening required  Status of chronic or acute concerns   #social update- with husbands dementia diagnosis- would like to stay in home but knows may not be possible- have not amde alternate arrangements  #Bilateral  foot/lower leg numbness S: Patient complains of bilateral feet/lower leg numbness. Also notes a sock line at both ankles worse on the right. Also gest pain over right shin- worse with walking or standing. Propping legs up helps.  -looking back 12/05/15 had vas [place] abi that was largely normal -occasional hip pain but no back pain like she had in 11/26/15 when started initial workup for right leg pain.   She has also had long-term balance issues and is at high risk for falls-currently using 3 pronged cane.  She does have a history of stroke as well as macular degeneration which likely strongly contributes to balance issues -Has tried physical therapy in the past in 2021 and had not done particular helpful-cardiology and ENT had recommended considering vestibular rehab- she still wants to hold off on this  A/P: she reports overall manageable (both pain and paresthesias). Suspect neuropathy - offered neurology consult but she wants to hold off for now unless it worsens- she will let me know if it does - will check b12 and tsh with paresthesias   #Atrial fibrillation S: On Xarelto 20 mg daily for anticoagulation.    On metoprolol 100 mg twice a day for rate control. prior digoxin.  A/P: appropriately rate controlled and anticoagulated- continue current meds    #Hypertension S: Controlled on metoprolol alone 100 mg twice daily  BP Readings from Last 3 Encounters:  11/25/21 122/64  05/13/21 124/82  02/25/21 (!) 152/88   A/P: Controlled. Continue current medications.      #Hypothyroidism S: Compliant with levothyroxine 75 mcg Lab Results  Component Value Date   TSH 1.15 05/13/2021  A/P: hopefully stable- update tsh today. Continue current meds for now    #CKD stage III #osteoarthritis hands/elbows. voltaren failure in past- doing better now.  S: Compliant with blood pressure medications.  Sparing etodolac in the past-now off with CKD and Xarelto use. A/P:   hopefully stable renal function-  update CMP today. Continue current meds for now    #Hyperlipidemia S: Compliant with rosuvastatin 10 mg  -On atorvastatin in the past Lab Results  Component Value Date   CHOL 123 11/07/2020   HDL 67.60 11/07/2020   LDLCALC 38 11/07/2020   LDLDIRECT 47.0 09/04/2015   TRIG 85.0 11/07/2020   CHOLHDL 2 11/07/2020   A/P: Cholesterol very well controlled-dose recently reduced within last year-update lipid panel and if remains this low could even try  5 mg dose  # Depression S: Medication: Lexapro 10 mg    11/25/2021   10:46 AM 05/13/2021   10:20 AM 12/12/2020    3:06 PM  Depression screen PHQ 2/9  Decreased Interest 0 0 0  Down, Depressed, Hopeless 1 0 0  PHQ - 2 Score 1 0 0  Altered sleeping 0 0 0  Tired, decreased energy 0 0 0  Change in appetite 0 0 0  Feeling bad or failure about yourself  0 0 0  Trouble concentrating 0 0 0  Moving slowly or fidgety/restless 0 0 0  Suicidal thoughts  0 0  PHQ-9 Score 1 0 0  Difficult doing work/chores Not difficult at all Not difficult at all Not difficult at all  A/P: Full remission-continue current medication. Some stress with potential life change/aging process/husbands dementia  #GERD-on famotidine 20 mg twice daily in past- doesn't need now  #Osteoporosis-January 2019 bone density largely stable.  Bone study 11/12/2020 roughly stable though distal radius in osteoporosis range- tries to walk around home as .  She takes calcium and vitamin D and wants hold off on medicine unless worsening numbers-and with numbers within 2% of prior she opted out of medication - recheck next year and can reconsider  Recommended follow up: Return in about 6 months (around 05/26/2022) for followup or sooner if needed.Schedule b4 you leave. Future Appointments  Date Time Provider [place]  12/03/2021 10:30 AM [facility] CCM PHARMACIST [facility]   Lab/Order associations:NOT  fasting- fruit and blueberry muffin   ICD-10-CM   1. Preventative health care   Z00.00     2. Hyperlipidemia, unspecified hyperlipidemia type  E78.5 CBC with Differential/Platelet    Comprehensive metabolic panel    Lipid panel    3. Hypothyroidism due to acquired atrophy of thyroid  E03.4 TSH    4. Longstanding persistent atrial fibrillation (HCC)  I48.11     5. Paresthesias  R20.2 Vitamin B12     Return precautions advised.  Garret Reddish, MD

## 2021-11-25 NOTE — Progress Notes (Unsigned)
Chronic Care Management Pharmacy Note  12/04/2021 Name:  Dashanti Burr MRN:  [id number] DOB:  07-22-1931  Summary: PharmD FU.  Crestor down to 11m lipids remaining controlled - tolerating this dose well  BP has been controlled in office - denies dizziness but still reporting balance issues.  Using a cane to get around the house - counseled on fall precaution.  Recommendations/Changes made from today's visit: No changes  Plan: FU 6 months   Subjective: NLaronica Bhagatis an 88y.o. year old female who is a primary patient of Hunter, SBrayton Mars MD.  The CCM team was consulted for assistance with disease management and care coordination needs.    Engaged with patient by telephone for follow up visit in response to provider referral for pharmacy case management and/or care coordination services.   Consent to Services:  The patient was given the following information about Chronic Care Management services today, agreed to services, and gave verbal consent: 1. CCM service includes personalized support from designated clinical staff supervised by the primary care provider, including individualized plan of care and coordination with other care providers 2. 24/7 contact phone numbers for assistance for urgent and routine care needs. 3. Service will only be billed when office clinical staff spend 20 minutes or more in a month to coordinate care. 4. Only one practitioner may furnish and bill the service in a calendar month. 5.The patient may stop CCM services at any time (effective at the end of the month) by phone call to the office staff. 6. The patient will be responsible for cost sharing (co-pay) of up to 20% of the service fee (after annual deductible is met). Patient agreed to services and consent obtained.  Patient Care Team: HMarin Olp MD as PCP - General (Family Medicine) NJosue Hector MD as PCP - Cardiology (Cardiology) SShon Hough MD as Consulting Physician  (Ophthalmology) BRicki Miller BTana Felts MD as Referring Physician (Ophthalmology) AStar Age MD as Consulting Physician (Neurology) DEdythe Clarity [facility] Centeras Pharmacist (Pharmacist)  Recent office visits:  05/13/2021 OV (PCP) HMarin Olp MD; we reduced rosuvastatin from 253mto 10 mg- she will take half of her 2012mablets until she runs out then can reach out ot mail order to fill the 10 mg I sent in today   02/25/2021 OV (PCP) HunMarin OlpD; no medication changes indicated.   Recent consult visits:  None   Hospital visits:  None in previous 6 months   Objective:  Lab Results  Component Value Date   CREATININE 1.13 11/25/2021   BUN 36 (H) 11/25/2021   GFR 43.46 (L) 11/25/2021   GFRNONAA 65 (L) 03/21/2013   GFRAA 76 (L) 03/21/2013   NA 139 11/25/2021   K 5.0 11/25/2021   CALCIUM 9.9 11/25/2021   CO2 31 11/25/2021   GLUCOSE 83 11/25/2021    Lab Results  Component Value Date/Time   HGBA1C 5.4 10/05/2013 08:37 AM   HGBA1C 5.5 03/22/2013 05:15 AM   GFR 43.46 (L) 11/25/2021 11:47 AM   GFR 44.09 (L) 05/13/2021 11:19 AM    Last diabetic Eye exam: No results found for: "HMDIABEYEEXA"  Last diabetic Foot exam: No results found for: "HMDIABFOOTEX"   Lab Results  Component Value Date   CHOL 142 11/25/2021   HDL 69.40 11/25/2021   LDLCALC 58 11/25/2021   LDLDIRECT 47.0 09/04/2015   TRIG 72.0 11/25/2021   CHOLHDL 2 11/25/2021       Latest Ref Rng & Units  11/25/2021   11:47 AM 05/13/2021   11:19 AM 11/07/2020   11:40 AM  Hepatic Function  Total Protein 6.0 - 8.3 g/dL 6.7  6.7  7.0   Albumin 3.5 - 5.2 g/dL 4.3  4.6  4.5   AST 0 - 37 U/L _0 ALT 0 - 35 U/L 25  22  32   Alk Phosphatase 39 - 117 U/L 71  83  76   Total Bilirubin 0.2 - 1.2 mg/dL 0.5  0.6  0.6     Lab Results  Component Value Date/Time   TSH 0.88 11/25/2021 11:47 AM   TSH 1.15 05/13/2021 11:19 AM   FREET4 1.84 (H) 01/10/2013 09:38 AM   FREET4 1.04 06/30/2012 09:52 AM        Latest Ref Rng & Units 11/25/2021   11:47 AM 11/07/2020   11:40 AM 05/07/2020   12:04 PM  CBC  WBC 4.0 - 10.5 K/uL 7.4  7.7  8.3   Hemoglobin 12.0 - 15.0 g/dL 13.4  13.2  13.7   Hematocrit 36.0 - 46.0 % 40.4  39.5  40.3   Platelets 150.0 - 400.0 K/uL 179.0  186.0  191.0     Lab Results  Component Value Date/Time   VD25OH 62 06/04/2009 10:01 PM    Clinical ASCVD: Yes  The ASCVD Risk score (Arnett DK, et al., 2019) failed to calculate for the following reasons:   The 2019 ASCVD risk score is only valid for ages 7 to 61       11/25/2021   10:46 AM 05/13/2021   10:20 AM 12/12/2020    3:06 PM  Depression screen PHQ 2/9  Decreased Interest 0 0 0  Down, Depressed, Hopeless 1 0 0  PHQ - 2 Score 1 0 0  Altered sleeping 0 0 0  Tired, decreased energy 0 0 0  Change in appetite 0 0 0  Feeling bad or failure about yourself  0 0 0  Trouble concentrating 0 0 0  Moving slowly or fidgety/restless 0 0 0  Suicidal thoughts  0 0  PHQ-9 Score 1 0 0  Difficult doing work/chores Not difficult at all Not difficult at all Not difficult at all     Social History   Tobacco Use  Smoking Status Former   Types: Cigarettes   Quit date: 04/28/1966   Years since quitting: 55.6  Smokeless Tobacco Never   BP Readings from Last 3 Encounters:  11/25/21 122/64  05/13/21 124/82  02/25/21 (!) 152/88   Pulse Readings from Last 3 Encounters:  11/25/21 60  05/13/21 79  02/25/21 76   Wt Readings from Last 3 Encounters:  11/25/21 97 lb 12.8 oz (44.4 kg)  05/13/21 102 lb 3.2 oz (46.4 kg)  02/25/21 103 lb (46.7 kg)   BMI Readings from Last 3 Encounters:  11/25/21 19.10 kg/m  05/13/21 19.96 kg/m  02/25/21 20.12 kg/m    Assessment/Interventions: Review of patient past medical history, allergies, medications, health status, including review of consultants reports, laboratory and other test data, was performed as part of comprehensive evaluation and provision of chronic care management  services.   SDOH:  (Social Determinants of Health) assessments and interventions performed: No, done within a year Financial Resource Strain: Low Risk  (12/12/2020)   Overall Financial Resource Strain (CARDIA)    Difficulty of Paying Living Expenses: Not hard at all    SDOH Interventions    [place] Office Visit from 11/25/2021 in [place]  [place] Clinical Support from 12/12/2020 in [place] Visit from 05/03/2018 in [place] Visit from 09/29/2017 in [place] Interventions -- Intervention Not Indicated -- --  Transportation Interventions -- Intervention Not Indicated -- --  Depression Interventions/Treatment  PHQ2-9 Score <4 Follow-up Not Indicated -- Medication, Counseling Medication  Financial Strain Interventions -- Intervention Not Indicated -- --  Physical Activity Interventions -- Intervention Not Indicated  [balance issues] -- --  Stress Interventions -- Intervention Not Indicated -- --  Social Connections Interventions -- Intervention Not Indicated -- --      Financial Resource Strain: Low Risk  (12/12/2020)   Overall Financial Resource Strain (CARDIA)    Difficulty of Paying Living Expenses: Not hard at all    [place]: No Food Insecurity (12/12/2020)  Housing: Low Risk  (12/12/2020)  Transportation Needs: No Transportation Needs (12/12/2020)  Alcohol Screen: Low Risk  (12/12/2020)  Depression (PHQ2-9): Low Risk  (11/25/2021)  Financial Resource Strain: Low Risk  (12/12/2020)  Physical Activity: Inactive (12/12/2020)  Social Connections: Unknown (12/12/2020)  Stress: No Stress Concern Present (12/12/2020)  Tobacco Use: Medium Risk (11/25/2021)    CCM Care Plan  Allergies  Allergen Reactions   Rofecoxib     REACTION: Elevated LFT's    Medications Reviewed Today     Reviewed by Edythe Clarity, [facility] (Pharmacist) on 12/04/21 at 1018  Med List Status: <None>   Medication Order Taking? Sig Documenting Provider Last Dose Status Informant  Acetaminophen (TYLENOL ARTHRITIS PAIN PO) [id number] Yes Take by mouth. [provider] Taking Active   Calcium Carbonate-Vitamin D 600-400 MG-UNIT tablet [id number] Yes Take 1 tablet by mouth 2 (two) times daily. [provider] Taking Active Self  escitalopram (LEXAPRO) 10 MG tablet [id number] Yes TAKE 1 TABLET BY MOUTH DAILY Marin Olp, MD Taking Active   glucosamine-chondroitin 500-400 MG tablet [id number] Yes Take 1 tablet by mouth 2 (two) times daily. [provider] Taking Active Self  levothyroxine (SYNTHROID) 75 MCG tablet [id number] Yes TAKE 1 TABLET BY MOUTH EVERY DAY BEFORE BREAKFAST Marin Olp, MD Taking Active   metoprolol tartrate (LOPRESSOR) 100 MG tablet [id number] Yes Take 1 tablet (100 mg total) by mouth 2 (two) times daily. Marin Olp, MD Taking Active   Multiple Vitamins-Minerals (PRESERVISION AREDS 2 PO) [id number] Yes Take 1 tablet by mouth 2 (two) times daily. Reported on 09/04/2015 [provider] Taking Active Self  rosuvastatin (CRESTOR) 10 MG tablet [id number] Yes Take 1 tablet (10 mg total) by mouth daily. Marin Olp, MD Taking Active   vitamin A 10000 UNIT capsule [id number] Yes Take 10,000 Units by mouth daily. [provider] Taking Active   XARELTO 20 MG TABS tablet [id number] Yes TAKE 1 TABLET BY MOUTH DAILY WITH SUPPER Marin Olp, MD Taking Active             Patient Active Problem List   Diagnosis Date Noted   Pain due to onychomycosis of toenails of both feet 12/23/2019   Balance disorder 05/03/2018   Macular degeneration 03/31/2017   Difficulty swallowing pills 11/26/2016   Hyperlipidemia 09/01/2014   CKD (chronic kidney disease), stage III ([place]) 03/02/2014   Rosacea 11/23/2013   Major depression in full remission ([place]) 11/23/2013   TIA  (transient ischemic attack) 03/21/2013   Hypertension 03/04/2012   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 05/30/2011  Hx of ischemic left MCA stroke 04/08/2011   PERSONAL HX COLONIC POLYPS 10/22/2009   CHANGE IN BOWELS 09/20/2009   GASTROESOPHAGEAL REFLUX DISEASE, MILD 06/04/2009   Raynaud's syndrome 03/06/2009   SCOLIOSIS, LUMBAR SPINE 01/03/2009   Atrial fibrillation ([place]) 07/07/2007   Hypothyroidism 11/23/2006   Osteopenia 11/23/2006   History of mitral valve repair 09/11/2006   Osteoarthritis 09/11/2006    Immunization History  Administered Date(s) Administered   Fluad Quad(high Dose 65+) 11/30/2018, 01/17/2020   Influenza Split 12/17/2010, 12/02/2011   Influenza Whole 03/17/2001, 12/18/2006, 12/28/2007, 01/03/2009, 12/26/2009   Influenza, High Dose Seasonal PF 12/22/2012, 03/06/2015, 11/26/2015, 12/09/2017   Influenza,inj,Quad PF,6+ Mos 11/23/2013   Influenza-Unspecified 11/20/2016   PFIZER(Purple Top)SARS-COV-2 Vaccination 06/09/2019, 06/30/2019, 02/16/2020   Pneumococcal Conjugate-13 03/02/2014   Pneumococcal Polysaccharide-23 03/17/2000, 09/16/2006   Td 03/18/1995, 09/15/2006, 02/22/2021   Tdap 12/17/2010   Unspecified SARS-COV-2 Vaccination 12/08/2020   Zoster Recombinat (Shingrix) 09/18/2016, 11/20/2016    Conditions to be addressed/monitored:  Afib, HTN, TIA, Hypothyroidism, Osteopenia, Depression, HLD  Care Plan : General Pharmacy (Adult)  Updates made by Edythe Clarity, RPH since 12/04/2021 12:00 AM     Problem: Afib, HTN, TIA, Hypothyroidism, Osteopenia, Depression, HLD   Priority: High  Onset Date: 02/25/2021     Long-Range Goal: Patient-Specific Goal   Start Date: 02/25/2021  Expected End Date: 08/26/2021  Recent Progress: On track  Priority: High  Note:   Current Barriers:  Balance issues unsteady on her feet  Pharmacist Clinical Goal(s):  Patient will achieve improvement in balance as evidenced by symptoms through collaboration  with PharmD and provider.   Interventions: 1:1 collaboration with Marin Olp, MD regarding development and update of comprehensive plan of care as evidenced by provider attestation and co-signature Inter-disciplinary care team collaboration (see longitudinal plan of care) Comprehensive medication review performed; medication list updated in electronic medical record  Hypertension (BP goal <130/80) 12/03/21 -Controlled based on office BP -Current treatment: Metoprolol tartrate 156m bid Appropriate, Effective, Safe, Accessible -Medications previously tried: digoxin, nebivolol  -Current home readings: not checking at home -Current exercise habits: minimal, balance issues prevent her from doing much walking -Denies hypotensive/hypertensive symptoms -Educated on BP goals and benefits of medications for prevention of heart attack, stroke and kidney damage; Exercise goal of 150 minutes per week; Importance of home blood pressure monitoring; Proper BP monitoring technique; -Counseled to monitor BP at home if able, document, and provide log at future appointments -She continues to use a cane, does still have some balance issues.  She denies any recent falls.  Denies dizziness.  She has a walker but prefers to use cane. Counseled on fall precautions. NO changes needed.  Hyperlipidemia/ Hx of TIA: (LDL goal < 70) -Controlled, most recent LDL is 58 -Current treatment: Rosuvastatin 162mdaily Appropriate, Effective, Safe, Accessible -Medications previously tried: Atorvastatin  -Educated on Cholesterol goals;  Benefits of statin for ASCVD risk reduction; Importance of limiting foods high in cholesterol; -Recommended to continue current medication Most recent LDL is excellent - continuing to reduce dose of Crestor and lipids remain well controlled. She is tolerating 1081mose well so will keep as is for now. No changes to meds - continue routine lipid screenings yearly.  Atrial  Fibrillation (Goal: prevent stroke and major bleeding) -Controlled -Current treatment: Rate control: Metoprolol XL 100m75mD Anticoagulation: Xarelto 20mg28mly -Medications previously tried: warfarin (convenience with monitoring) -Home BP and HR readings: N/A  -Counseled on increased risk of stroke due to Afib and benefits of anticoagulation for  stroke prevention; importance of adherence to anticoagulant exactly as prescribed; bleeding risk associated with Xarelto and importance of self-monitoring for signs/symptoms of bleeding; -Recommended to continue current medication Assessed patient finances. Copay is manageable at this time.  Patient became unresponsive for approximately 30 seconds during our visit.  She was unable to respond to my questioning, after a brief period she was able to state her name, the year, month, day and purpose of her visit.  Urgent appointment with PCP initiated.  Depression/Anxiety (Goal: Minimize symptoms) -Controlled, not asssessed -Current treatment: Escitalopram 31m daily -Medications previously tried/failed: none noted -PHQ9:  Flowsheet Row Clinical Support [place]12/[place] PHQ-9 Total Score 0     -Educated on Benefits of medication for symptom control Reports she does not get out much due to balance issues and inability to drive.  Her husband drives her most places.  Reports her mood overall is still good. -Recommended to continue current medication  Patient Goals/Self-Care Activities Patient will:  - take medications as prescribed as evidenced by patient report and record review focus on medication adherence by pill box check blood pressure weekly if able, document, and provide at future appointments  Follow Up Plan: The care management team will reach out to the patient again over the next 120 days.             Medication Assistance: None required.  Patient affirms current coverage meets  needs.  Compliance/Adherence/Medication fill history: Care Gaps: None  Star-Rating Drugs: Rosuvastatin 28mdaily 08/05/21 90ds  Patient's preferred pharmacy is:  [facility] (MAPaterosELChip[place] Falls[place]hone: [phone]: [phone]-Rainey Memorial HospitalRIME #1[place]II[facility] PaT NWNational [facility]9[place]hone: [phone]: [phone] Brook - DupontMASacoWAModenaAZUnicoiZMinnesota5[address]015[address]hone: [phone]: [phone] Creek[place] 47GilmanWDigestivecare IncF SPSch[place]7GambrillsC [address]hone: [phone]: [phone] Uses pill box? Yes Pt endorses 100% compliance  We discussed: Benefits of medication synchronization, packaging and delivery as well as enhanced pharmacist oversight with [facility]. Patient decided to: Continue current medication management strategy  Care Plan and Follow Up Patient Decision:  Patient agrees to Care Plan and Follow-up.  Plan: The care management team will reach out to the patient again over the next 180 days.  ChBeverly MilchPharmD Clinical Pharmacist  [phone]

## 2021-12-03 ENCOUNTER — Ambulatory Visit: Payer: Medicare Other | Admitting: Pharmacist

## 2021-12-03 DIAGNOSIS — E785 Hyperlipidemia, unspecified: Secondary | ICD-10-CM

## 2021-12-03 DIAGNOSIS — I10 Essential (primary) hypertension: Secondary | ICD-10-CM

## 2021-12-04 NOTE — Patient Instructions (Addendum)
Visit Information   Goals Addressed             This Visit's Progress    Track and Manage My Blood Pressure-Hypertension   On track    Timeframe:  Long-Range Goal Priority:  High Start Date: 02/25/21                            Expected End Date:  08/26/21                     Follow Up Date 05/26/21    - check blood pressure 3 times per week - choose a place to take my blood pressure (home, clinic or office, retail store) - write blood pressure results in a log or diary    Why is this important?   You won't feel high blood pressure, but it can still hurt your blood vessels.  High blood pressure can cause heart or kidney problems. It can also cause a stroke.  Making lifestyle changes like losing a little weight or eating less salt will help.  Checking your blood pressure at home and at different times of the day can help to control blood pressure.  If the doctor prescribes medicine remember to take it the way the doctor ordered.  Call the office if you cannot afford the medicine or if there are questions about it.     Notes:        Patient Care Plan: General Pharmacy (Adult)     Problem Identified: Afib, HTN, TIA, Hypothyroidism, Osteopenia, Depression, HLD   Priority: High  Onset Date: 02/25/2021     Long-Range Goal: Patient-Specific Goal   Start Date: 02/25/2021  Expected End Date: 08/26/2021  Recent Progress: On track  Priority: High  Note:   Current Barriers:  Balance issues unsteady on her feet  Pharmacist Clinical Goal(s):  Patient will achieve improvement in balance as evidenced by symptoms through collaboration with PharmD and provider.   Interventions: 1:1 collaboration with Marin Olp, MD regarding development and update of comprehensive plan of care as evidenced by provider attestation and co-signature Inter-disciplinary care team collaboration (see longitudinal plan of care) Comprehensive medication review performed; medication list updated in  electronic medical record  Hypertension (BP goal <130/80) 12/03/21 -Controlled based on office BP -Current treatment: Metoprolol tartrate '100mg'$  bid Appropriate, Effective, Safe, Accessible -Medications previously tried: digoxin, nebivolol  -Current home readings: not checking at home -Current exercise habits: minimal, balance issues prevent her from doing much walking -Denies hypotensive/hypertensive symptoms -Educated on BP goals and benefits of medications for prevention of heart attack, stroke and kidney damage; Exercise goal of 150 minutes per week; Importance of home blood pressure monitoring; Proper BP monitoring technique; -Counseled to monitor BP at home if able, document, and provide log at future appointments -She continues to use a cane, does still have some balance issues.  She denies any recent falls.  Denies dizziness.  She has a walker but prefers to use cane. Counseled on fall precautions. NO changes needed.  Hyperlipidemia/ Hx of TIA: (LDL goal < 70) -Controlled, most recent LDL is 58 -Current treatment: Rosuvastatin '10mg'$  daily Appropriate, Effective, Safe, Accessible -Medications previously tried: Atorvastatin  -Educated on Cholesterol goals;  Benefits of statin for ASCVD risk reduction; Importance of limiting foods high in cholesterol; -Recommended to continue current medication Most recent LDL is excellent - continuing to reduce dose of Crestor and lipids remain well controlled. She is tolerating  $'10mg'l$  dose well so will keep as is for now. No changes to meds - continue routine lipid screenings yearly.  Atrial Fibrillation (Goal: prevent stroke and major bleeding) -Controlled -Current treatment: Rate control: Metoprolol XL '100mg'$  BID Anticoagulation: Xarelto '20mg'$  daily -Medications previously tried: warfarin (convenience with monitoring) -Home BP and HR readings: N/A  -Counseled on increased risk of stroke due to Afib and benefits of anticoagulation for stroke  prevention; importance of adherence to anticoagulant exactly as prescribed; bleeding risk associated with Xarelto and importance of self-monitoring for signs/symptoms of bleeding; -Recommended to continue current medication Assessed patient finances. Copay is manageable at this time.  Patient became unresponsive for approximately 30 seconds during our visit.  She was unable to respond to my questioning, after a brief period she was able to state her name, the year, month, day and purpose of her visit.  Urgent appointment with PCP initiated.  Depression/Anxiety (Goal: Minimize symptoms) -Controlled, not asssessed -Current treatment: Escitalopram '10mg'$  daily -Medications previously tried/failed: none noted -PHQ9:  Flowsheet Row Clinical Support from 12/12/2020 in [place]  PHQ-9 Total Score 0     -Educated on Benefits of medication for symptom control Reports she does not get out much due to balance issues and inability to drive.  Her husband drives her most places.  Reports her mood overall is still good. -Recommended to continue current medication  Patient Goals/Self-Care Activities Patient will:  - take medications as prescribed as evidenced by patient report and record review focus on medication adherence by pill box check blood pressure weekly if able, document, and provide at future appointments  Follow Up Plan: The care management team will reach out to the patient again over the next 120 days.            The patient verbalized understanding of instructions, educational materials, and care plan provided today and DECLINED offer to receive copy of patient instructions, educational materials, and care plan.  Telephone follow up appointment with pharmacy team member scheduled for: 6 months  Theresa [name], [place], PharmD Clinical Pharmacist  [facility] [phone]

## 2021-12-13 ENCOUNTER — Telehealth: Payer: Self-pay | Admitting: Family Medicine

## 2021-12-13 DIAGNOSIS — R490 Dysphonia: Secondary | ICD-10-CM

## 2021-12-13 NOTE — Telephone Encounter (Signed)
Patient: Theresa [name]   Phone [phone] Provider: Dr Kathy Breach with patient to schedule her AWV.  She stated he would like a referral for her throat hoarseness that's interfering with her speaking.  Please advise.  Thanks [employer]

## 2021-12-13 NOTE — Telephone Encounter (Signed)
Referral to ENT has been placed 

## 2021-12-16 ENCOUNTER — Ambulatory Visit: Payer: Medicare Other

## 2021-12-26 DIAGNOSIS — H34831 Tributary (branch) retinal vein occlusion, right eye, with macular edema: Secondary | ICD-10-CM | POA: Diagnosis not present

## 2022-01-06 ENCOUNTER — Telehealth: Payer: Self-pay | Admitting: Family Medicine

## 2022-01-06 NOTE — Telephone Encounter (Signed)
   LAST APPOINTMENT DATE:   11/25/21 CPE  NEXT APPOINTMENT DATE: 05/26/22 OV with PCP  MEDICATION: metoprolol tartrate (LOPRESSOR) 100 MG tablet [[id number]]   Is the patient out of medication?  Unknown.  PHARMACY: [facility]) [place], [place], [place] [address] Phone: [phone]  Fax: [phone] DEA #: [id number]

## 2022-01-07 ENCOUNTER — Other Ambulatory Visit: Payer: Self-pay

## 2022-01-07 MED ORDER — METOPROLOL TARTRATE 100 MG PO TABS: 100.0000 mg | ORAL_TABLET | Freq: Two times a day (BID) | ORAL | 2 refills | Status: DC

## 2022-01-07 NOTE — Telephone Encounter (Signed)
Refill sent to pharmacy.   

## 2022-01-08 ENCOUNTER — Ambulatory Visit (INDEPENDENT_AMBULATORY_CARE_PROVIDER_SITE_OTHER): Payer: Medicare Other

## 2022-01-08 ENCOUNTER — Ambulatory Visit (INDEPENDENT_AMBULATORY_CARE_PROVIDER_SITE_OTHER): Payer: Medicare Other | Admitting: Family Medicine

## 2022-01-08 VITALS — BP 118/80 | HR 88 | Ht 60.0 in | Wt 96.0 lb

## 2022-01-08 DIAGNOSIS — M47814 Spondylosis without myelopathy or radiculopathy, thoracic region: Secondary | ICD-10-CM | POA: Diagnosis not present

## 2022-01-08 DIAGNOSIS — M546 Pain in thoracic spine: Secondary | ICD-10-CM | POA: Diagnosis not present

## 2022-01-08 DIAGNOSIS — R0781 Pleurodynia: Secondary | ICD-10-CM | POA: Diagnosis not present

## 2022-01-08 DIAGNOSIS — R296 Repeated falls: Secondary | ICD-10-CM

## 2022-01-08 DIAGNOSIS — S2242XA Multiple fractures of ribs, left side, initial encounter for closed fracture: Secondary | ICD-10-CM | POA: Diagnosis not present

## 2022-01-08 NOTE — Patient Instructions (Signed)
Thank you for coming in today.   Please get an Xray today before you leave   I've referred you to Physical Therapy.  Let us know if you don't hear from them in one week.   Please go to [facility] supply to get the rib binder we talked about today. You may also be able to get it from [employer].

## 2022-01-08 NOTE — Progress Notes (Signed)
g        I, Theresa [name], am serving as a Education administrator for Dr. Lynne Leader.  Theresa [name] is a 88 y.o. female who presents to [place] at [facility] today for low back pain after fall.  Patient reports a fall on Sunday,   Patient states that since the fall on October 22 she does not feel like she has had a normal bowel movement. Patient lost her balance in the kitchen and fell against the stove and hit her back and "various limbs" on stove handles. Patient states the pain is mainly on the left side mid back. Sitting and standing and bending is when the pain is the worst. Patient is taking tylenol regularly, using Aspercreme, and rest. Patient uses a cane to ambulate, but not always in the house. Patient says that her balance is what throws her off especially if she tries to move too quickly.  She notes that she is having increased falls or almost falls recently.    Pertinent review of systems: No fevers or chills  Relevant historical information: Osteopenia.  Scoliosis.   Exam:  BP 118/80   Pulse 88   Ht 5' (1.524 m)   Wt 96 lb (43.5 kg)   LMP  (LMP Unknown)   SpO2 95%   BMI 18.75 kg/m  General: Well Developed, well nourished, and in no acute distress.   MSK: T-spine and L-spine nontender midline.  Tender palpation left lateral inferior ribs.  Pain with trunk motion.  Lower extremity strength is intact.   Gait antalgic using a cane to ambulate    Lab and Radiology Results  X-ray images T-spine and left ribs obtained today personally and independently interpreted.  T-spine: Scoliosis present with curvature dominant lower portion of thoracic spine and visible portion of lumbar spine.  No acute fractures are visible.  Degenerative changes present especially dominant lower portion of thoracic spine.  Left ribs: 2 minimally displaced inferior rib fractures left side.  Appears to be the bottom 2 most ribs although this is difficult to tell fully.   Await formal  radiology review     Assessment and Plan: 88 y.o. female with left lateral rib and flank pain after a fall.  Based on my read of the x-ray she has what looks to be 2 inferior rib fractures left lateral ribs.  Await radiology overread.  Plan for rib binder Tylenol and some watchful waiting.  We will recheck in a month.  The ribs will get better however I am more worried about her fall risk.  She has had several falls or near falls recently. Plan for home health physical therapy for fall prevention assessment and improvement.  She does not drive so home health physical therapy is appropriate.   PDMP not reviewed this encounter. Orders Placed This Encounter  Procedures   DG Ribs Unilateral W/Chest Left    Standing Status:   Future    Number of Occurrences:   1    Standing Expiration Date:   01/09/2023    Order Specific Question:   Reason for Exam (SYMPTOM  OR DIAGNOSIS REQUIRED)    Answer:   eval rib pain left    Order Specific Question:   Preferred imaging location?    Answer:   [facility]   DG Thoracic Spine 2 View    Standing Status:   Future    Number of Occurrences:   1    Standing Expiration Date:   01/09/2023  Order Specific Question:   Reason for Exam (SYMPTOM  OR DIAGNOSIS REQUIRED)    Answer:   eval pain tspine    Order Specific Question:   Preferred imaging location?    Answer:   [facility]   Ambulatory referral to [facility]    Referral Priority:   Routine    Referral Type:   [place]    Referral Reason:   Specialty Services Required    Requested Specialty:   [place]    Number of Visits Requested:   1   No orders of the defined types were placed in this encounter.    Discussed warning signs or symptoms. Please see discharge instructions. Patient expresses understanding.   The above documentation has been reviewed and is accurate and complete Lynne Leader, M.D.

## 2022-01-13 NOTE — Progress Notes (Signed)
Thoracic spine x-ray shows some scoliosis but no fractures.

## 2022-01-13 NOTE — Progress Notes (Signed)
X-ray left ribs show a mildly displaced left ninth and 10th rib fracture. Lets recheck in about 2 weeks.

## 2022-01-25 ENCOUNTER — Other Ambulatory Visit: Payer: Self-pay | Admitting: Family Medicine

## 2022-01-27 ENCOUNTER — Ambulatory Visit: Payer: Medicare Other | Admitting: Family Medicine

## 2022-01-27 ENCOUNTER — Ambulatory Visit (INDEPENDENT_AMBULATORY_CARE_PROVIDER_SITE_OTHER): Payer: Medicare Other

## 2022-01-27 VITALS — BP 128/82 | HR 56 | Ht 60.0 in | Wt 96.6 lb

## 2022-01-27 DIAGNOSIS — R0781 Pleurodynia: Secondary | ICD-10-CM

## 2022-01-27 DIAGNOSIS — S2242XA Multiple fractures of ribs, left side, initial encounter for closed fracture: Secondary | ICD-10-CM | POA: Diagnosis not present

## 2022-01-27 NOTE — Patient Instructions (Addendum)
Thank you for coming in today.   Recheck with me as needed.   Please check your answering machine or voicemail about home health PT.

## 2022-01-27 NOTE — Progress Notes (Signed)
I, Theresa [name], LAT, ATC acting as a scribe for Lynne Leader, MD.  Theresa [name] is a 88 y.o. female who presents to [place] at [facility] today for follow-up rib fracture.  Theresa [name] was seen about 2 weeks ago on October 25.  After a fall injuring her ribs.  X-ray at that time showed fracture to the left ninth and 10th ribs.  She was treated conservatively with home health physical therapy, rib binder Tylenol and heating pad.  Physical therapy's goal was for fall prevention.  Today, pt reports L-sided rib pain is considerably better. Pt feels the rib binder is uncomfortable due to the shortness of her torso. Pt reports not being called by home health PT.  She notes she may have missed calls and does not have voicemail set up on her phone.  She does get emails when we send her messages.  I believe this may be referring to my chart.   Pertinent review of systems: No fevers or chills  Relevant historical information: Scoliosis.  CKD.  Balance disorder   Exam:  BP 128/82   Pulse (!) 56   Ht 5' (1.524 m)   Wt 96 lb 9.6 oz (43.8 kg)   LMP  (LMP Unknown)   SpO2 97%   BMI 18.87 kg/m  General: Well Developed, well nourished, and in no acute distress.   MSK: Left ribs mildly tender to palpation.    Lab and Radiology Results  X-ray images left ribs with chest obtained today personally and independently interpreted. Minimally displaced left ninth and 10th rib fractures are again visualized with no change in displacement. Await formal radiology review  Narrative & Impression  CLINICAL DATA:  Evaluate left-sided upper thoracic pain and rib pain 3-4 days after fall.   EXAM: LEFT RIBS AND CHEST - 3+ VIEW   COMPARISON:  09/05/2009   FINDINGS: Interval linear opacity at the bases attributed to atelectasis.   Left lateral ninth and tenth rib fractures with mild displacement. No hemothorax or pneumothorax seen.   Mild cardiomegaly.  Mitral valve repair.    Advanced lower thoracic and lumbar spine degeneration with scoliosis and lumbar fusion.   IMPRESSION: 1. Mildly displaced left ninth and tenth rib fractures. No pneumothorax. 2. Atelectasis at the lung bases.     Electronically Signed   By: Jorje Guild M.D.   On: 01/10/2022 15:54    CLINICAL DATA:  Pain in the thoracic spine.   EXAM: THORACIC SPINE 2 VIEWS   COMPARISON:  None Available.   FINDINGS: Diffuse disc space collapse and endplate ridging. Endplate degeneration is most advanced at the lower thoracic and upper lumbar spine where there is a dextroscoliotic curvature. Maintained posterior mediastinal fat planes. Known left tenth rib fracture with mild displacement.   IMPRESSION: 1. No acute finding. 2. Advanced thoracolumbar degeneration with scoliosis.     Electronically Signed   By: Jorje Guild M.D.   On: 01/10/2022 15:56   I, Lynne Leader, personally (independently) visualized and performed the interpretation of the images attached in this note.     Assessment and Plan: 88 y.o. female with left rib fracture clinically improving.  Continue conservative management. We will try to get her a phone number for home health physical therapy so she can call herself.  We will also investigate to see if she even was called.  She does not have voicemail set up so it is possible she was called and missed the phone call and arranging for  home health physical therapy may be very challenging in this situation.  We will do our best and see what happens.  The goal of home health physical therapy is for fall prevention.  Check back with me as needed.   PDMP not reviewed this encounter. Orders Placed This Encounter  Procedures   DG Ribs Unilateral W/Chest Left    Standing Status:   Future    Number of Occurrences:   1    Standing Expiration Date:   02/26/2022    Order Specific Question:   Reason for Exam (SYMPTOM  OR DIAGNOSIS REQUIRED)    Answer:   rib pain     Order Specific Question:   Preferred imaging location?    Answer:   [facility]   No orders of the defined types were placed in this encounter.    Discussed warning signs or symptoms. Please see discharge instructions. Patient expresses understanding.   .[email or web address]

## 2022-01-29 NOTE — Progress Notes (Signed)
Left rib x-ray shows stable appearing rib fractures like we talked about in clinic.

## 2022-02-11 ENCOUNTER — Encounter: Payer: Self-pay | Admitting: Internal Medicine

## 2022-02-11 ENCOUNTER — Ambulatory Visit (INDEPENDENT_AMBULATORY_CARE_PROVIDER_SITE_OTHER): Payer: Medicare Other | Admitting: Internal Medicine

## 2022-02-11 VITALS — BP 110/78 | HR 60 | Temp 97.9°F | Ht 60.0 in | Wt 94.8 lb

## 2022-02-11 DIAGNOSIS — R490 Dysphonia: Secondary | ICD-10-CM

## 2022-02-11 DIAGNOSIS — J370 Chronic laryngitis: Secondary | ICD-10-CM

## 2022-02-11 DIAGNOSIS — T7840XA Allergy, unspecified, initial encounter: Secondary | ICD-10-CM | POA: Diagnosis not present

## 2022-02-11 MED ORDER — FLUTICASONE PROPIONATE 50 MCG/ACT NA SUSP: 2.0000 | Freq: Every day | NASAL | 6 refills | Status: AC

## 2022-02-11 MED ORDER — SIMPLY SALINE 0.9 % NA AERS: 2.0000 | INHALATION_SPRAY | Freq: Every day | NASAL | 1 refills | Status: AC | PRN

## 2022-02-11 MED ORDER — LORATADINE 10 MG PO TABS: 10.0000 mg | ORAL_TABLET | Freq: Every day | ORAL | 11 refills | Status: AC

## 2022-02-11 NOTE — Progress Notes (Signed)
Acute Office Visit  Subjective:     Patient ID: Theresa [name], female    DOB: March 02, 1932, 88 y.o.   MRN: [id number]  Chief Complaint  Patient presents with   raspy voice    Pt stated that she has had a raspy voice for the past couple of months. She feels like there is a lot of mucous in her throat    HPI Patient is in today for raspy voice Of uncertain duration.  Her husband who helps with history taking but also is not that great of a historian either thinks it has been much longer than a couple months but cannot clarify 1 year 5 years 10 years over the progression.  She feels like she is get mucus sticking to her larynx and called and ask about a laryngoscopy asked but when I offered referral to 1 declined.  She does not report any other symptoms tied to it but history is limited as she seems to be a poor historian.  She also has a sense of fullness in the left side of her neck which she cannot say how long it has been there and there is no lymph node or mass palpable there.  She feels like mucus and sputum is getting down around her larynx and she repeats that several times  Review of Systems  Constitutional:  Negative for chills and fever.  HENT:  Positive for nosebleeds.         Objective:    BP 110/78   Pulse 60   Temp 97.9 F (36.6 C)   Ht 5' (1.524 m)   Wt 94 lb 12.8 oz (43 kg)   LMP  (LMP Unknown)   SpO2 99%   BMI 18.51 kg/m  Wt Readings from Last 3 Encounters:  02/11/22 94 lb 12.8 oz (43 kg)  01/27/22 96 lb 9.6 oz (43.8 kg)  01/08/22 96 lb (43.5 kg)      Physical Exam She has a very thin neck and it looks like muscular atrophy on the right side with more strength in the left side of her neck I cannot feel any mass or lymphadenopathy but there is a sense of muscular fullness more in the left side than the right.  Her oropharynx has cobblestoning and her nasal mucosa is very pale swollen and boggy.     Assessment & Plan:   I Recommended that we treat it  as a simple allergy at first and see if any improvement with an aggressive sinus allergy regimen as prescribed.  I also encouraged her to let me know if it is not working that we can Teaching laboratory technician and I even encouraged her to except the referral without waiting but she wanted to hold off.  I also offered a CAT scan of the neck with contrast to evaluate the sense of fullness in the neck but she declined that as well for now.  Discussed the possibility of vocal cord polyps and the value of direct visualization for hoarseness  Problem List Items Addressed This Visit   None Visit Diagnoses     Raspy voice    -  Primary   Chronic laryngitis       Relevant Medications   fluticasone (FLONASE) 50 MCG/ACT nasal spray   Saline (SIMPLY SALINE) 0.9 % AERS   loratadine (CLARITIN) 10 MG tablet   Allergy, initial encounter       Relevant Medications   fluticasone (FLONASE) 50 MCG/ACT nasal spray   Saline (SIMPLY  SALINE) 0.9 % AERS   loratadine (CLARITIN) 10 MG tablet       Meds ordered this encounter  Medications   fluticasone (FLONASE) 50 MCG/ACT nasal spray    Sig: Place 2 sprays into both nostrils daily.    Dispense:  16 g    Refill:  6   Saline (SIMPLY SALINE) 0.9 % AERS    Sig: Place 2 each into the nose daily as needed.    Dispense:  500 mL    Refill:  1   loratadine (CLARITIN) 10 MG tablet    Sig: Take 1 tablet (10 mg total) by mouth daily.    Dispense:  30 tablet    Refill:  11      Loralee Pacas, MD

## 2022-02-20 DIAGNOSIS — H34831 Tributary (branch) retinal vein occlusion, right eye, with macular edema: Secondary | ICD-10-CM | POA: Diagnosis not present

## 2022-02-25 NOTE — Progress Notes (Signed)
Patient ID: Theresa [name], female   DOB: 1931/12/28, 88 y.o.   MRN: [id number]     88 y.o.  has a history of mitral regurgitation, status post mitral valve repair at [facility] in 2008, chronic atrial fibrillation , prior stroke in 12/12 at [facility] (felt to be embolic), on  Xarelto for anticoagulation    Last echo 03/22/13  EF normal 55% and intact MV repair with no residual MR    She has poor balance and vision from macular degeneration   No cardiac complaints   Walking with cane no falls   ROS: Denies fever, malais, weight loss, blurry vision, decreased visual acuity, cough, sputum, SOB, hemoptysis, pleuritic pain, palpitaitons, heartburn, abdominal pain, melena, lower extremity edema, claudication, or rash.  All other systems reviewed and negative  General: Affect appropriate Thin frail female  HEENT: normal Neck supple with no adenopathy JVP normal no bruits no thyromegaly Lungs clear with no wheezing and good diaphragmatic motion Heart:  S1/S2 no murmur, no rub, gallop or click PMI normal Abdomen: benighn, BS positve, no tenderness, no AAA no bruit.  No HSM or HJR Distal pulses intact with no bruits No edema Neuro non-focal Skin warm and dry No muscular weakness   Current Outpatient Medications  Medication Sig Dispense Refill   Acetaminophen (TYLENOL ARTHRITIS PAIN PO) Take by mouth.     BIOTIN PO Take 1 tablet by mouth daily.     Calcium Carbonate-Vitamin D 600-400 MG-UNIT tablet Take 1 tablet by mouth 2 (two) times daily.     escitalopram (LEXAPRO) 10 MG tablet TAKE 1 TABLET BY MOUTH DAILY 90 tablet 3   fluticasone (FLONASE) 50 MCG/ACT nasal spray Place 2 sprays into both nostrils daily. 16 g 6   glucosamine-chondroitin 500-400 MG tablet Take 1 tablet by mouth 2 (two) times daily.     levothyroxine (SYNTHROID) 75 MCG tablet TAKE 1 TABLET BY MOUTH EVERY DAY BEFORE BREAKFAST 90 tablet 3   loratadine (CLARITIN) 10 MG tablet Take 1 tablet (10 mg  total) by mouth daily. 30 tablet 11   metoprolol tartrate (LOPRESSOR) 100 MG tablet Take 1 tablet (100 mg total) by mouth 2 (two) times daily. 180 tablet 2   Multiple Vitamins-Minerals (PRESERVISION AREDS 2 PO) Take 1 tablet by mouth 2 (two) times daily. Reported on 09/04/2015     rosuvastatin (CRESTOR) 10 MG tablet Take 1 tablet (10 mg total) by mouth daily. 90 tablet 3   Saline (SIMPLY SALINE) 0.9 % AERS Place 2 each into the nose daily as needed. 500 mL 1   vitamin A 10000 UNIT capsule Take 10,000 Units by mouth daily.     XARELTO 20 MG TABS tablet TAKE 1 TABLET BY MOUTH DAILY WITH SUPPER 90 tablet 3   No current facility-administered medications for this visit.    Allergies  Rofecoxib  Electrocardiogram:   03/04/2022    afib rate 59 nonspecific ST changes 03/04/2022 Flutter rate 57 otherwise normal   Assessment and Plan  MV Repair: SBE prophylaxis  No murmur on exam intact by echo 2015 will update   PAF: On xarelto good rate control Appears to have chronic afib Hct 40.3, PLT 191 Cr 1 labs ok 05/07/20 D/C digoxin   Chol:    Lab Results  Component Value Date   LDLCALC 58 11/25/2021   Leg Pain: ? DJD ABI's were normal 12/05/15  no evidence of embolic event compliant with NOAC   Dysphagia:not interested in EGD cutting glucosamine tabs in half  Depression: controlled with Lexapro   Gait:  Seems neurologic and related to loss of visual cues with macular degeneration  Dig level low when last checked Has had prior cerebellar infarcts from afib MRI 09/21/19 no acute findings chronic left frontoparietal infarct no vascular disease Should have PT/OT for safety balance issues but she deferred No recent falls   Thyroid:  on replacement TSH normal 11/25/21  f/u Dr Yong Channel     F/U in a year    Jenkins Rouge

## 2022-03-04 ENCOUNTER — Ambulatory Visit: Payer: Medicare Other | Attending: Cardiovascular Disease | Admitting: Cardiovascular Disease

## 2022-03-04 ENCOUNTER — Encounter: Payer: Self-pay | Admitting: Cardiovascular Disease

## 2022-03-04 VITALS — BP 120/62 | HR 57 | Ht 60.0 in | Wt 97.4 lb

## 2022-03-04 DIAGNOSIS — I482 Chronic atrial fibrillation, unspecified: Secondary | ICD-10-CM

## 2022-03-04 DIAGNOSIS — Z9889 Other specified postprocedural states: Secondary | ICD-10-CM | POA: Diagnosis not present

## 2022-03-04 NOTE — Patient Instructions (Addendum)
Medication Instructions:  Your physician recommends that you continue on your current medications as directed. Please refer to the Current Medication list given to you today.  *If you need a refill on your cardiac medications before your next appointment, please call your pharmacy*  Lab Work: If you have labs (blood work) drawn today and your tests are completely normal, you will receive your results only by: MyChart Message (if you have MyChart) OR A paper copy in the mail If you have any lab test that is abnormal or we need to change your treatment, we will call you to review the results.  Testing/Procedures: None ordered today.  Follow-Up: At [facility], you and your health needs are our priority.  As part of our continuing mission to provide you with exceptional heart care, we have created designated Provider Care Teams.  These Care Teams include your primary Cardiologist (physician) and Advanced Practice Providers (APPs -  Physician Assistants and Nurse Practitioners) who all work together to provide you with the care you need, when you need it.  We recommend signing up for the patient portal called "MyChart".  Sign up information is provided on this After Visit Summary.  MyChart is used to connect with patients for Virtual Visits (Telemedicine).  Patients are able to view lab/test results, encounter notes, upcoming appointments, etc.  Non-urgent messages can be sent to your provider as well.   To learn more about what you can do with MyChart, go to [email or web address].    Your next appointment:   7 month(s)  The format for your next appointment:   In Person  Provider:   Peter Nishan, MD      Important Information About Sugar       

## 2022-03-05 DIAGNOSIS — H353221 Exudative age-related macular degeneration, left eye, with active choroidal neovascularization: Secondary | ICD-10-CM | POA: Diagnosis not present

## 2022-03-25 DIAGNOSIS — H35363 Drusen (degenerative) of macula, bilateral: Secondary | ICD-10-CM | POA: Diagnosis not present

## 2022-03-25 DIAGNOSIS — H34831 Tributary (branch) retinal vein occlusion, right eye, with macular edema: Secondary | ICD-10-CM | POA: Diagnosis not present

## 2022-03-25 DIAGNOSIS — H35453 Secondary pigmentary degeneration, bilateral: Secondary | ICD-10-CM | POA: Diagnosis not present

## 2022-03-25 DIAGNOSIS — H353221 Exudative age-related macular degeneration, left eye, with active choroidal neovascularization: Secondary | ICD-10-CM | POA: Diagnosis not present

## 2022-04-07 DIAGNOSIS — H353221 Exudative age-related macular degeneration, left eye, with active choroidal neovascularization: Secondary | ICD-10-CM | POA: Diagnosis not present

## 2022-04-16 DIAGNOSIS — H34831 Tributary (branch) retinal vein occlusion, right eye, with macular edema: Secondary | ICD-10-CM | POA: Diagnosis not present

## 2022-04-18 DIAGNOSIS — Z1231 Encounter for screening mammogram for malignant neoplasm of breast: Secondary | ICD-10-CM | POA: Diagnosis not present

## 2022-04-18 LAB — HM MAMMOGRAPHY

## 2022-05-08 DIAGNOSIS — H353221 Exudative age-related macular degeneration, left eye, with active choroidal neovascularization: Secondary | ICD-10-CM | POA: Diagnosis not present

## 2022-05-23 DIAGNOSIS — J383 Other diseases of vocal cords: Secondary | ICD-10-CM | POA: Diagnosis not present

## 2022-05-26 ENCOUNTER — Encounter: Payer: Self-pay | Admitting: Family Medicine

## 2022-05-26 ENCOUNTER — Ambulatory Visit (INDEPENDENT_AMBULATORY_CARE_PROVIDER_SITE_OTHER): Payer: Medicare Other | Admitting: Family Medicine

## 2022-05-26 VITALS — BP 131/74 | HR 64 | Temp 97.7°F | Ht 60.0 in | Wt 97.2 lb

## 2022-05-26 DIAGNOSIS — E785 Hyperlipidemia, unspecified: Secondary | ICD-10-CM | POA: Diagnosis not present

## 2022-05-26 DIAGNOSIS — N183 Chronic kidney disease, stage 3 unspecified: Secondary | ICD-10-CM

## 2022-05-26 DIAGNOSIS — F3342 Major depressive disorder, recurrent, in full remission: Secondary | ICD-10-CM

## 2022-05-26 DIAGNOSIS — E034 Atrophy of thyroid (acquired): Secondary | ICD-10-CM

## 2022-05-26 DIAGNOSIS — I10 Essential (primary) hypertension: Secondary | ICD-10-CM

## 2022-05-26 DIAGNOSIS — I4811 Longstanding persistent atrial fibrillation: Secondary | ICD-10-CM | POA: Diagnosis not present

## 2022-05-26 LAB — COMPREHENSIVE METABOLIC PANEL
ALT: 36 U/L — ABNORMAL HIGH (ref 0–35)
AST: 28 U/L (ref 0–37)
Albumin: 4.2 g/dL (ref 3.5–5.2)
Alkaline Phosphatase: 73 U/L (ref 39–117)
BUN: 62 mg/dL — ABNORMAL HIGH (ref 6–23)
CO2: 28 mEq/L (ref 19–32)
Calcium: 10.1 mg/dL (ref 8.4–10.5)
Chloride: 102 mEq/L (ref 96–112)
Creatinine, Ser: 1.2 mg/dL (ref 0.40–1.20)
GFR: 40.29 mL/min — ABNORMAL LOW (ref >60.00)
Glucose, Bld: 79 mg/dL (ref 70–99)
Potassium: 4.5 mEq/L (ref 3.5–5.1)
Sodium: 137 mEq/L (ref 135–145)
Total Bilirubin: 0.4 mg/dL (ref 0.2–1.2)
Total Protein: 6.3 g/dL (ref 6.0–8.3)

## 2022-05-26 NOTE — Patient Instructions (Addendum)
Get updated covid shot at [facility] and let us know  Please stop by lab before you go If you have mychart- we will send your results within 3 business days of [place] receiving them.  If you do not have mychart- we will call you about results within 5 business days of [place] receiving them.  *please also note that you will see labs on mychart as soon as they post. I will later go in and write notes on them- will say "notes from Dr. Yong Channel"   Recommended follow up: Return in about 6 months (around 11/26/2022) for physical or sooner if needed.Schedule b4 you leave.

## 2022-05-26 NOTE — Progress Notes (Signed)
Phone [phone] In person visit   Subjective:   Theresa [name] is a 89 y.o. year old very pleasant female patient who presents for/with See problem oriented charting Chief Complaint  Patient presents with   Follow-up    Pt has no questions or concerns    Hypertension   Depression   Past Medical History-  Patient Active Problem List   Diagnosis Date Noted   Balance disorder 05/03/2018    Priority: High   Macular degeneration 03/31/2017    Priority: High   TIA (transient ischemic attack) 03/21/2013    Priority: High   Hx of ischemic left MCA stroke 04/08/2011    Priority: High   Atrial fibrillation ([place]) 07/07/2007    Priority: High   History of mitral valve repair 09/11/2006    Priority: High   Hyperlipidemia 09/01/2014    Priority: Medium    CKD (chronic kidney disease), stage III ([place]) 03/02/2014    Priority: Medium    Major depression in full remission ([place]) 11/23/2013    Priority: Medium    Hypertension 03/04/2012    Priority: Medium    GASTROESOPHAGEAL REFLUX DISEASE, MILD 06/04/2009    Priority: Medium    Hypothyroidism 11/23/2006    Priority: Medium    Osteopenia 11/23/2006    Priority: Medium    Osteoarthritis 09/11/2006    Priority: Medium    Rosacea 11/23/2013    Priority: Low   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     BIOTIN PO Take 1 tablet by mouth daily.     Calcium Carbonate-Vitamin D 600-400 MG-UNIT tablet Take 1 tablet by  mouth 2 (two) times daily.     escitalopram (LEXAPRO) 10 MG tablet TAKE 1 TABLET BY MOUTH DAILY 90 tablet 3   fluticasone (FLONASE) 50 MCG/ACT nasal spray Place 2 sprays into both nostrils daily. 16 g 6   glucosamine-chondroitin 500-400 MG tablet Take 1 tablet by mouth 2 (two) times daily.     levothyroxine (SYNTHROID) 75 MCG tablet TAKE 1 TABLET BY MOUTH EVERY DAY BEFORE BREAKFAST 90 tablet 3   loratadine (CLARITIN) 10 MG tablet Take 1 tablet (10 mg total) by mouth daily. 30 tablet 11   metoprolol tartrate (LOPRESSOR) 100 MG tablet Take 1 tablet (100 mg total) by mouth 2 (two) times daily. 180 tablet 2   Multiple Vitamins-Minerals (PRESERVISION AREDS 2 PO) Take 1 tablet by mouth 2 (two) times daily. Reported on 09/04/2015     rosuvastatin (CRESTOR) 10 MG tablet Take 1 tablet (10 mg total) by mouth daily. 90 tablet 3   Saline (SIMPLY SALINE) 0.9 % AERS Place 2 each into the nose daily as needed. 500 mL 1   vitamin A 10000 UNIT capsule Take 10,000 Units by mouth daily.     XARELTO 20 MG TABS tablet TAKE 1 TABLET BY MOUTH DAILY WITH SUPPER 90 tablet 3   No current facility-administered medications for this visit.     Objective:  BP 131/74   Pulse 64   Temp 97.7 F (36.5 C) (Temporal)   Ht 5' (1.524 m)   Wt 97 lb 3.2 oz (44.1 kg)   LMP  (LMP Unknown)   SpO2 100%   BMI 18.98 kg/m  Gen: NAD, resting comfortably CV: RRR no murmurs rubs or gallops Lungs: CTAB no crackles, wheeze, rhonchi Ext: trace edema Skin: warm, dry Neuro:  walks with cane    Assessment and Plan   #[facility]- [facility] for now to care for husband with dementia. [facility]  #Atrial fibrillation S: On Xarelto 20 mg daily for anticoagulation.    On metoprolol 100 mg twice a day for rate control. prior digoxin.  A/P: appropriately anticoagulated and rate controlled- continue current medicine   #balance issues S: Likely related to loss of visual clues with macular  degeneration.  Prior cerebellar infarcts on neuroimaging. - Has declined PT/OT in the past as recommended by me and Dr. Lyndee Hensen balance and vestibular rehab A/P: overall stable/chronic- continue to monitor - using her cane  #Hypertension S: Controlled on metoprolol alone 100 mg twice daily A/P: reasonable control for age- do not want ot increase fall risk- continue current medications    #Hypothyroidism S: Compliant with levothyroxine 75 mcg Lab Results  Component Value Date   TSH 0.88 11/25/2021  A/P: stable- continue current medicines - recheck next visit    #CKD stage III #osteoarthritis hands/elbows. voltaren failure in past- doing better now.  S: Compliant with blood pressure medications.  Sparing etodolac in the past-now offs with CKD and Xarelto use. A/P: has been stbale with GFR in mid 40s- update  cmp today   #Hyperlipidemia S: Compliant with rosuvastatin 10 mg  -On atorvastatin in the past Lab Results  Component Value Date   CHOL 142 11/25/2021   HDL 69.40 11/25/2021   LDLCALC 58 11/25/2021   LDLDIRECT 47.0 09/04/2015   TRIG 72.0 11/25/2021   CHOLHDL 2 11/25/2021  A/P: excellent control- continue current medications    # Depression S: compliant with lexapro '10mg'$     05/26/2022   11:09 AM 02/11/2022   11:26 AM 11/25/2021   10:46 AM  Depression screen PHQ 2/9  Decreased Interest 0 0 0  Down, Depressed, Hopeless 0 0 1  PHQ - 2 Score 0 0 1  Altered sleeping 0  0  Tired, decreased energy 1  0  Change in appetite 0  0  Feeling bad or failure about yourself  0  0  Trouble concentrating 0  0  Moving slowly or fidgety/restless 0  0  Suicidal thoughts 0    PHQ-9 Score 1  1  Difficult doing work/chores Not difficult at all  Not difficult at all  A/P: full remission- continue current medications   #Osteopenia-January 2019 bone density largely stable.  Bone density 11/12/2020 roughly stable.  She takes calcium and vitamin D and wants hold off on medicine  unless worsening numbers- will be due at 6 month visit   #Nails- has onychomycosis left great toenail- does not want to try lamisil- may try to get nails trimmed  #raspy voice- saw ENT 05/23/22- vocal cord bowing- not much to be done other than humidifier and drinking more water . Plans to essentially tolerate   Recommended follow up: Return in about 6 months (around 11/26/2022) for physical or sooner if needed.Schedule b4 you leave. Future Appointments  Date Time Provider [place]  06/16/2022  2:00 PM Rosana Hoes,  Jerilynn Mages, RPH [facility] None  09/25/2022 10:00 AM Josue Hector, MD [id number]   Lab/Order associations:   ICD-10-CM   1. Longstanding persistent atrial fibrillation (HCC)  I48.11     2. Stage 3 chronic kidney disease, unspecified whether stage 3a or 3b CKD (HCC)  N18.30 Comprehensive metabolic panel    3. Hyperlipidemia, unspecified hyperlipidemia type  E78.5     4. Primary hypertension  I10     5. Hypothyroidism due to acquired atrophy of thyroid  E03.4     6. Recurrent major depressive disorder, in full remission ([place])  F33.42       No orders of the defined types were placed in this encounter.   Return precautions advised.  Garret Reddish, MD

## 2022-06-03 ENCOUNTER — Other Ambulatory Visit: Payer: Self-pay | Admitting: Family Medicine

## 2022-06-11 DIAGNOSIS — H34831 Tributary (branch) retinal vein occlusion, right eye, with macular edema: Secondary | ICD-10-CM | POA: Diagnosis not present

## 2022-06-16 ENCOUNTER — Ambulatory Visit: Payer: Medicare Other | Admitting: Pharmacist

## 2022-06-16 NOTE — Progress Notes (Signed)
Care Management & Coordination Services Pharmacy Note  06/17/2022 Name:  Theresa [name] MRN:  [id number] DOB:  August 17, 1931  Summary: PharmD FU visit.  Patient caring for her husband doing well overall with chronic conditions.  No concerns currently and 100% adherence with meds.    Recommendations/Changes made from today's visit: No changes  Follow up plan: FU 1 year   Subjective: Theresa [name] is an 89 y.o. year old female who is a primary patient of Yong Channel, Brayton Mars, MD.  The care coordination team was consulted for assistance with disease management and care coordination needs.    Engaged with patient by telephone for follow up visit.  Recent office visits:  05/13/2021 OV (PCP) Marin Olp, MD; we reduced rosuvastatin from 20mg  to 10 mg- she will take half of her 20mg  tablets until she runs out then can reach out ot mail order to fill the 10 mg I sent in today   02/25/2021 OV (PCP) Marin Olp, MD; no medication changes indicated.   Recent consult visits:  None   Hospital visits:  None in previous 6 months   Objective:  Lab Results  Component Value Date   CREATININE 1.20 05/26/2022   BUN 62 (H) 05/26/2022   GFR 40.29 (L) 05/26/2022   GFRNONAA 65 (L) 03/21/2013   GFRAA 76 (L) 03/21/2013   NA 137 05/26/2022   K 4.5 05/26/2022   CALCIUM 10.1 05/26/2022   CO2 28 05/26/2022   GLUCOSE 79 05/26/2022    Lab Results  Component Value Date/Time   HGBA1C 5.4 10/05/2013 08:37 AM   HGBA1C 5.5 03/22/2013 05:15 AM   GFR 40.29 (L) 05/26/2022 12:00 PM   GFR 43.46 (L) 11/25/2021 11:47 AM    Last diabetic Eye exam: No results found for: "HMDIABEYEEXA"  Last diabetic Foot exam: No results found for: "HMDIABFOOTEX"   Lab Results  Component Value Date   CHOL 142 11/25/2021   HDL 69.40 11/25/2021   LDLCALC 58 11/25/2021   LDLDIRECT 47.0 09/04/2015   TRIG 72.0 11/25/2021   CHOLHDL 2 11/25/2021       Latest Ref Rng & Units 05/26/2022   12:00 PM  11/25/2021   11:47 AM 05/13/2021   11:19 AM  Hepatic Function  Total Protein 6.0 - 8.3 g/dL 6.3  6.7  6.7   Albumin 3.5 - 5.2 g/dL 4.2  4.3  4.6   AST 0 - 37 U/L 28  24  21    ALT 0 - 35 U/L 36  25  22   Alk Phosphatase 39 - 117 U/L 73  71  83   Total Bilirubin 0.2 - 1.2 mg/dL 0.4  0.5  0.6     Lab Results  Component Value Date/Time   TSH 0.88 11/25/2021 11:47 AM   TSH 1.15 05/13/2021 11:19 AM   FREET4 1.84 (H) 01/10/2013 09:38 AM   FREET4 1.04 06/30/2012 09:52 AM       Latest Ref Rng & Units 11/25/2021   11:47 AM 11/07/2020   11:40 AM 05/07/2020   12:04 PM  CBC  WBC 4.0 - 10.5 K/uL 7.4  7.7  8.3   Hemoglobin 12.0 - 15.0 g/dL 13.4  13.2  13.7   Hematocrit 36.0 - 46.0 % 40.4  39.5  40.3   Platelets 150.0 - 400.0 K/uL 179.0  186.0  191.0     Lab Results  Component Value Date/Time   VD25OH 62 06/04/2009 10:01 PM   VITAMINB12 996 (H) 11/25/2021 11:47 AM  [id number] 06/10/2016 12:23 PM  Yes   Clinical ASCVD:  The ASCVD Risk score (Arnett DK, et al., 2019) failed to calculate for the following reasons:   The 2019 ASCVD risk score is only valid for ages 32 to 53   The patient has a prior MI or stroke diagnosis         05/26/2022   11:09 AM 02/11/2022   11:26 AM 11/25/2021   10:46 AM  Depression screen PHQ 2/9  Decreased Interest 0 0 0  Down, Depressed, Hopeless 0 0 1  PHQ - 2 Score 0 0 1  Altered sleeping 0  0  Tired, decreased energy 1  0  Change in appetite 0  0  Feeling bad or failure about yourself  0  0  Trouble concentrating 0  0  Moving slowly or fidgety/restless 0  0  Suicidal thoughts 0    PHQ-9 Score 1  1  Difficult doing work/chores Not difficult at all  Not difficult at all     Social History   Tobacco Use  Smoking Status Former   Types: Cigarettes   Quit date: 04/28/1966   Years since quitting: 56.1  Smokeless Tobacco Never   BP Readings from Last 3 Encounters:  05/26/22 131/74  03/04/22 120/62  02/11/22 110/78   Pulse Readings from  Last 3 Encounters:  05/26/22 64  03/04/22 (!) 57  02/11/22 60   Wt Readings from Last 3 Encounters:  05/26/22 97 lb 3.2 oz (44.1 kg)  03/04/22 97 lb 6.4 oz (44.2 kg)  02/11/22 94 lb 12.8 oz (43 kg)   BMI Readings from Last 3 Encounters:  05/26/22 18.98 kg/m  03/04/22 19.02 kg/m  02/11/22 18.51 kg/m    Allergies  Allergen Reactions   Rofecoxib     REACTION: Elevated LFT's    Medications Reviewed Today     Reviewed by Edythe Clarity, RPH (Pharmacist) on 06/17/22 at 1520  Med List Status: <None>   Medication Order Taking? Sig Documenting Provider Last Dose Status Informant  Acetaminophen (TYLENOL ARTHRITIS PAIN PO) [id number] Yes Take by mouth. [provider] Taking Active   BIOTIN PO [id number] Yes Take 1 tablet by mouth daily. [provider] Taking Active   Calcium Carbonate-Vitamin D 600-400 MG-UNIT tablet [id number] Yes Take 1 tablet by mouth 2 (two) times daily. [provider] Taking Active Self  escitalopram (LEXAPRO) 10 MG tablet [id number] Yes TAKE 1 TABLET BY MOUTH DAILY Marin Olp, MD Taking Active   fluticasone [facility]) 50 MCG/ACT nasal spray [id number] Yes Place 2 sprays into both nostrils daily. Loralee Pacas, MD Taking Active   glucosamine-chondroitin 500-400 MG tablet [id number] Yes Take 1 tablet by mouth 2 (two) times daily. [provider] Taking Active Self  levothyroxine (SYNTHROID) 75 MCG tablet [id number] Yes TAKE 1 TABLET BY MOUTH EVERY DAY BEFORE BREAKFAST Marin Olp, MD Taking Active   loratadine (CLARITIN) 10 MG tablet [id number] Yes Take 1 tablet (10 mg total) by mouth daily. Loralee Pacas, MD Taking Active   metoprolol tartrate (LOPRESSOR) 100 MG tablet [id number] Yes Take 1 tablet (100 mg total) by mouth 2 (two) times daily. Marin Olp, MD Taking Active   Multiple Vitamins-Minerals (PRESERVISION AREDS 2 PO) [id number] Yes Take 1 tablet by mouth 2 (two) times daily. Reported on 09/04/2015 [provider] Taking Active Self  rosuvastatin (CRESTOR) 10 MG tablet [id number] Yes Take 1 tablet (10 mg total) by mouth daily. Marin Olp, MD Taking Active  Saline (SIMPLY SALINE) 0.9 % AERS [id number] Yes Place 2 each into the nose daily as needed. Loralee Pacas, MD Taking Active   vitamin A 10000 UNIT capsule [id number] Yes Take 10,000 Units by mouth daily. [provider] Taking Active   XARELTO 20 MG TABS tablet [id number] Yes TAKE 1 TABLET BY MOUTH DAILY WITH SUPPER Marin Olp, MD Taking Active             SDOH:  (Social Determinants of Health) assessments and interventions performed: Yes Financial Resource Strain: Low Risk  (06/17/2022)   Overall Financial Resource Strain (CARDIA)    Difficulty of Paying Living Expenses: Not hard at all   Food Insecurity: No Food Insecurity (06/17/2022)   Hunger Vital Sign    Worried About Running Out of Food in the Last Year: Never true    Ran Out of Food in the Last Year: Never true    SDOH Interventions    [place] [facility] Visit from 11/25/2021 in [place] from 12/12/2020 in [place] Visit from 05/03/2018 in [place] Visit from 09/29/2017 in [place] Interventions -- Intervention Not Indicated -- --  Transportation Interventions -- Intervention Not Indicated -- --  Depression Interventions/Treatment  PHQ2-9 Score <4 Follow-up Not Indicated -- Medication, Counseling Medication  Financial Strain Interventions -- Intervention Not Indicated -- --  Physical Activity Interventions -- Intervention Not Indicated  [balance issues] -- --  Stress Interventions -- Intervention Not Indicated -- --  Social Connections Interventions -- Intervention Not Indicated -- --       Medication Assistance: None required.  Patient affirms current coverage meets  needs.  Medication Access: Within the past 30 days, how often has patient missed a dose of medication? 0 Is a pillbox or other method used to improve adherence? Yes  Factors that may affect medication adherence? no barriers identified Are meds synced by current pharmacy? No  Are meds delivered by current pharmacy? No  Does patient experience delays in picking up medications due to transportation concerns? No   [facility] Services Reviewed: Is patient disadvantaged to use [facility]?: Yes  Current Rx insurance plan: [place] Name and location of Current pharmacy:  [id number] ([place]) [place], [place] [address] Phone: [phone] Fax: [phone]  [facility] [address], [place] AT [facility] [address] [place] [address] Phone: [phone] Fax: [phone]  [facility] ([place]) [place], [place] [address] Phone: [phone] Fax: [phone]  [id number] - [address], [address] [place] [address] Phone: [phone] Fax: [phone]  [facility] services reviewed with patient today?: Yes  Patient requests to transfer care to [facility]?: No  Reason patient declined to change pharmacies: Loyalty to other pharmacy/Patient preference  Compliance/Adherence/Medication fill history: Care Gaps: None  Star-Rating Drugs: Rosuvastatin 10mg  04/29/22   Assessment/Plan     Hypertension (BP goal <130/80) 06/16/22 -Controlled based on office BP -Current treatment: Metoprolol tartrate 100mg  bid Appropriate, Effective, Safe, Accessible -Medications previously tried: digoxin, nebivolol  -Current home readings: not checking at home -Current exercise habits: minimal, balance issues prevent her from doing much  walking -Denies hypotensive/hypertensive symptoms -Educated on BP goals and benefits  of medications for prevention of heart attack, stroke and kidney damage; Exercise goal of 150 minutes per week; Importance of home blood pressure monitoring; Proper BP monitoring technique; -BP/HR well controlled.  She still has some balance issues but no dizziness.  Continue current doses.    Hyperlipidemia/ Hx of TIA: (LDL goal < 70 06/16/22  -Controlled, most recent LDL is 58 -Current treatment: Rosuvastatin 10mg  daily Appropriate, Effective, Safe, Accessible -Medications previously tried: Atorvastatin  -Educated on Cholesterol goals;  Benefits of statin for ASCVD risk reduction; Importance of limiting foods high in cholesterol; -Recommended to continue current medication -Tolerating Crestor, Adherence is 100%.   Denies any myalgias or associated adverse effects. Kidney function declining but she is at max dose for renal impairment so no adjustments are needed at this time.  Could consider switch to lipitor for kidney health.  Atrial Fibrillation (Goal: prevent stroke and major bleeding) -Controlled, not assessed -Current treatment: Rate control: Metoprolol XL 100mg  BID Anticoagulation: Xarelto 20mg  daily -Medications previously tried: warfarin (convenience with monitoring) -Home BP and HR readings: N/A  -Counseled on increased risk of stroke due to Afib and benefits of anticoagulation for stroke prevention; importance of adherence to anticoagulant exactly as prescribed; bleeding risk associated with Xarelto and importance of self-monitoring for signs/symptoms of bleeding; -Recommended to continue current medication   Depression/Anxiety (Goal: Minimize symptoms) -Controlled, not asssessed -Current treatment: Escitalopram 10mg  daily -Medications previously tried/failed: none noted -PHQ9:  Flowsheet Row Clinical Support from 12/12/2020 in [place]  PHQ-9 Total Score  0     -Educated on Benefits of medication for symptom control Reports she does not get out much due to balance issues and inability to drive.  Her husband drives her most places.  Reports her mood overall is still good. -Recommended to continue current medication           Beverly Milch, PharmD Clinical Pharmacist  [facility] [phone]

## 2022-06-19 DIAGNOSIS — H353221 Exudative age-related macular degeneration, left eye, with active choroidal neovascularization: Secondary | ICD-10-CM | POA: Diagnosis not present

## 2022-07-20 ENCOUNTER — Other Ambulatory Visit: Payer: Self-pay | Admitting: Family Medicine

## 2022-07-24 DIAGNOSIS — H353221 Exudative age-related macular degeneration, left eye, with active choroidal neovascularization: Secondary | ICD-10-CM | POA: Diagnosis not present

## 2022-08-01 DIAGNOSIS — H5203 Hypermetropia, bilateral: Secondary | ICD-10-CM | POA: Diagnosis not present

## 2022-08-06 DIAGNOSIS — H34831 Tributary (branch) retinal vein occlusion, right eye, with macular edema: Secondary | ICD-10-CM | POA: Diagnosis not present

## 2022-08-16 ENCOUNTER — Other Ambulatory Visit: Payer: Self-pay | Admitting: Family Medicine

## 2022-08-28 DIAGNOSIS — H353221 Exudative age-related macular degeneration, left eye, with active choroidal neovascularization: Secondary | ICD-10-CM | POA: Diagnosis not present

## 2022-09-12 NOTE — Progress Notes (Signed)
Patient ID: Theresa [name], female   DOB: 14-May-1931, 89 y.o.   MRN: [id number]     89 y.o.  has a history of mitral regurgitation, status post mitral valve repair at [facility] in 2008, chronic atrial fibrillation , prior [facility] in 12/[facility] (felt to be embolic), on  Xarelto for anticoagulation    Last echo 03/22/13  EF normal 55% and intact MV repair with no residual MR    She has poor balance and vision from macular degeneration   No cardiac complaints   Walking with cane no falls [facility] and husband has dementia  She has been married 27 years first marriage for both in their 8's   Her CrCl has consistently been < 50 ml/min and she should be on 15 mg of Xarelto  ROS: Denies fever, malais, weight loss, blurry vision, decreased visual acuity, cough, sputum, SOB, hemoptysis, pleuritic pain, palpitaitons, heartburn, abdominal pain, melena, lower extremity edema, claudication, or rash.  All other systems reviewed and negative  General: Affect appropriate Thin frail female  HEENT: normal Neck supple with no adenopathy JVP normal no bruits no thyromegaly Lungs clear with no wheezing and good diaphragmatic motion Heart:  S1/S2 no murmur, no rub, gallop or click PMI normal Abdomen: benighn, BS positve, no tenderness, no AAA no bruit.  No HSM or HJR Distal pulses intact with no bruits No edema Neuro non-focal Skin warm and dry No muscular weakness   Current Outpatient Medications  Medication Sig Dispense Refill   Acetaminophen (TYLENOL ARTHRITIS PAIN PO) Take by mouth.     BIOTIN PO Take 1 tablet by mouth daily.     Calcium Carbonate-Vitamin D 600-400 MG-UNIT tablet Take 1 tablet by mouth 2 (two) times daily.     escitalopram (LEXAPRO) 10 MG tablet Take 1 tablet (10 mg total) by mouth daily. 90 tablet 3   fluticasone (FLONASE) 50 MCG/ACT nasal spray Place 2 sprays into both nostrils daily. 16 g 6   glucosamine-chondroitin  500-400 MG tablet Take 1 tablet by mouth 2 (two) times daily.     levothyroxine (SYNTHROID) 75 MCG tablet TAKE 1 TABLET BY MOUTH EVERY DAY BEFORE BREAKFAST 90 tablet 3   loratadine (CLARITIN) 10 MG tablet Take 1 tablet (10 mg total) by mouth daily. 30 tablet 11   metoprolol tartrate (LOPRESSOR) 100 MG tablet Take 1 tablet (100 mg total) by mouth 2 (two) times daily. 180 tablet 2   Multiple Vitamins-Minerals (PRESERVISION AREDS 2 PO) Take 1 tablet by mouth 2 (two) times daily. Reported on 09/04/2015     rosuvastatin (CRESTOR) 10 MG tablet TAKE 1 TABLET BY MOUTH DAILY. GENERIC EQUIVALENT FOR CRESTOR 90 tablet 3   Saline (SIMPLY SALINE) 0.9 % AERS Place 2 each into the nose daily as needed. 500 mL 1   vitamin A [id number] UNIT capsule Take 10,000 Units by mouth daily.     XARELTO 20 MG TABS tablet TAKE 1 TABLET BY MOUTH DAILY WITH SUPPER 90 tablet 3   No current facility-administered medications for this visit.    Allergies  Rofecoxib  Electrocardiogram:   09/25/2022    afib rate 59 nonspecific ST changes 09/25/2022 Flutter rate 57 otherwise normal   Assessment and Plan  MV Repair: SBE prophylaxis  No murmur on exam intact by echo 2015 will update   PAF: On xarelto good rate control Appears to have chronic afib Hct 40.3, PLT 191 Cr 1 labs ok 05/07/20 D/C digoxin  Given CrCl < 75 age and low body weight change Xarelto doseing to 15 mg daily  Chol:    Lab Results  Component Value Date   LDLCALC 58 11/25/2021   Leg Pain: ? DJD ABI's were normal 12/05/15  no evidence of embolic event compliant with NOAC   Dysphagia:not interested in EGD cutting glucosamine tabs in half   Depression: controlled with Lexapro   Gait:  Seems neurologic and related to loss of visual cues with macular degeneration  Dig level low when last checked Has had prior cerebellar infarcts from afib MRI 09/21/19 no acute findings chronic left frontoparietal infarct no vascular disease Should have PT/OT for safety balance issues but  she deferred No recent falls   Thyroid:  on replacement TSH normal 11/25/21  f/u Dr Durene Cal   Decrease Xarelto to 15 mg daily  F/U in a year    [name]

## 2022-09-22 ENCOUNTER — Other Ambulatory Visit: Payer: Self-pay | Admitting: Family Medicine

## 2022-09-22 ENCOUNTER — Other Ambulatory Visit: Payer: Self-pay

## 2022-09-22 DIAGNOSIS — H34831 Tributary (branch) retinal vein occlusion, right eye, with macular edema: Secondary | ICD-10-CM | POA: Diagnosis not present

## 2022-09-22 DIAGNOSIS — H35363 Drusen (degenerative) of macula, bilateral: Secondary | ICD-10-CM | POA: Diagnosis not present

## 2022-09-22 DIAGNOSIS — H353221 Exudative age-related macular degeneration, left eye, with active choroidal neovascularization: Secondary | ICD-10-CM | POA: Diagnosis not present

## 2022-09-22 DIAGNOSIS — H35453 Secondary pigmentary degeneration, bilateral: Secondary | ICD-10-CM | POA: Diagnosis not present

## 2022-09-22 MED ORDER — ESCITALOPRAM OXALATE 10 MG PO TABS: 10.0000 mg | ORAL_TABLET | Freq: Every day | ORAL | 3 refills | Status: DC

## 2022-09-25 ENCOUNTER — Ambulatory Visit: Payer: Medicare Other | Attending: Cardiovascular Disease | Admitting: Cardiovascular Disease

## 2022-09-25 ENCOUNTER — Encounter: Payer: Self-pay | Admitting: Cardiovascular Disease

## 2022-09-25 VITALS — BP 128/76 | HR 59 | Ht 60.0 in | Wt 92.0 lb

## 2022-09-25 DIAGNOSIS — Z9889 Other specified postprocedural states: Secondary | ICD-10-CM | POA: Diagnosis not present

## 2022-09-25 DIAGNOSIS — I482 Chronic atrial fibrillation, unspecified: Secondary | ICD-10-CM | POA: Diagnosis not present

## 2022-09-25 DIAGNOSIS — Z79899 Other long term (current) drug therapy: Secondary | ICD-10-CM

## 2022-09-25 MED ORDER — RIVAROXABAN 15 MG PO TABS: 15.0000 mg | ORAL_TABLET | Freq: Every day | ORAL | 3 refills | Status: DC

## 2022-09-25 NOTE — Patient Instructions (Addendum)
Medication Instructions:  Your physician has recommended you make the following change in your medication:  1-Decrease Xarelto to 15 mg by mouth daily.  *If you need a refill on your cardiac medications before your next appointment, please call your pharmacy*  Lab Work: If you have labs (blood work) drawn today and your tests are completely normal, you will receive your results only by: MyChart Message (if you have MyChart) OR A paper copy in the mail If you have any lab test that is abnormal or we need to change your treatment, we will call you to review the results.  Testing/Procedures: None ordered today.  Follow-Up: At [facility], you and your health needs are our priority.  As part of our continuing mission to provide you with exceptional heart care, we have created designated Provider Care Teams.  These Care Teams include your primary Cardiologist (physician) and Advanced Practice Providers (APPs -  Physician Assistants and Nurse Practitioners) who all work together to provide you with the care you need, when you need it.  We recommend signing up for the patient portal called "MyChart".  Sign up information is provided on this After Visit Summary.  MyChart is used to connect with patients for Virtual Visits (Telemedicine).  Patients are able to view lab/test results, encounter notes, upcoming appointments, etc.  Non-urgent messages can be sent to your provider as well.   To learn more about what you can do with MyChart, go to [email or web address].    Your next appointment:   1 year(s)  Provider:   [name], MD

## 2022-09-25 NOTE — Addendum Note (Signed)
Addended by: Virl Axe, Mead Slane L on: 09/25/2022 10:57 AM   Modules accepted: Orders

## 2022-10-01 DIAGNOSIS — I349 Nonrheumatic mitral valve disorder, unspecified: Secondary | ICD-10-CM | POA: Diagnosis not present

## 2022-10-01 DIAGNOSIS — D6869 Other thrombophilia: Secondary | ICD-10-CM | POA: Diagnosis not present

## 2022-10-06 DIAGNOSIS — H34831 Tributary (branch) retinal vein occlusion, right eye, with macular edema: Secondary | ICD-10-CM | POA: Diagnosis not present

## 2022-10-16 DIAGNOSIS — H353221 Exudative age-related macular degeneration, left eye, with active choroidal neovascularization: Secondary | ICD-10-CM | POA: Diagnosis not present

## 2022-10-21 ENCOUNTER — Other Ambulatory Visit: Payer: Self-pay | Admitting: Family Medicine

## 2022-10-30 ENCOUNTER — Encounter (INDEPENDENT_AMBULATORY_CARE_PROVIDER_SITE_OTHER): Payer: Self-pay

## 2022-11-20 ENCOUNTER — Telehealth: Payer: Self-pay | Admitting: Family Medicine

## 2022-11-20 NOTE — Telephone Encounter (Signed)
Patient dropped off document assisted living form , to be filled out by provider. Patient requested to send it back via Fax within 5-days. Document is located in providers tray at front office.Please advise at Mobile [phone] (mobile)

## 2022-11-21 NOTE — Telephone Encounter (Signed)
On your desk

## 2022-11-21 NOTE — Telephone Encounter (Signed)
Form in your "to sign" folder. Called number on file for answer to ADL's and phone continued to ring, no vm came on.

## 2022-11-24 NOTE — Telephone Encounter (Signed)
Forms have been faxed 

## 2022-11-28 DIAGNOSIS — H353221 Exudative age-related macular degeneration, left eye, with active choroidal neovascularization: Secondary | ICD-10-CM | POA: Diagnosis not present

## 2022-12-06 ENCOUNTER — Other Ambulatory Visit: Payer: Self-pay | Admitting: Family Medicine

## 2022-12-08 ENCOUNTER — Encounter: Payer: Medicare Other | Admitting: Family Medicine

## 2022-12-12 ENCOUNTER — Other Ambulatory Visit: Payer: Self-pay | Admitting: Family Medicine

## 2022-12-15 DIAGNOSIS — H34831 Tributary (branch) retinal vein occlusion, right eye, with macular edema: Secondary | ICD-10-CM | POA: Diagnosis not present

## 2023-01-22 DIAGNOSIS — H353221 Exudative age-related macular degeneration, left eye, with active choroidal neovascularization: Secondary | ICD-10-CM | POA: Diagnosis not present

## 2023-02-20 DIAGNOSIS — H34831 Tributary (branch) retinal vein occlusion, right eye, with macular edema: Secondary | ICD-10-CM | POA: Diagnosis not present

## 2023-03-02 ENCOUNTER — Ambulatory Visit (INDEPENDENT_AMBULATORY_CARE_PROVIDER_SITE_OTHER): Payer: Medicare Other

## 2023-03-02 VITALS — BP 118/74 | HR 60 | Temp 98.5°F | Wt 99.8 lb

## 2023-03-02 DIAGNOSIS — Z23 Encounter for immunization: Secondary | ICD-10-CM | POA: Diagnosis not present

## 2023-03-02 DIAGNOSIS — Z0000 Encounter for general adult medical examination without abnormal findings: Secondary | ICD-10-CM

## 2023-03-02 NOTE — Patient Instructions (Signed)
Ms. Barnes , Thank you for taking time to come for your Medicare Wellness Visit. I appreciate your ongoing commitment to your health goals. Please review the following plan we discussed and let me know if I can assist you in the future.   Referrals/Orders/Follow-Ups/Clinician Recommendations: improve eye sight,  Each day, aim for 6 glasses of water, plenty of protein in your diet and try to get up and walk/ stretch every hour for 5-10 minutes at a time.    This is a list of the screening recommended for you and due dates:  Health Maintenance  Topic Date Due   Flu Shot  10/16/2022   COVID-19 Vaccine (5 - 2024-25 season) 11/16/2022   Medicare Annual Wellness Visit  11/26/2022   DTaP/Tdap/Td vaccine (5 - Td or Tdap) 02/23/2031   Pneumonia Vaccine  Completed   DEXA scan (bone density measurement)  Completed   Zoster (Shingles) Vaccine  Completed   HPV Vaccine  Aged Out    Advanced directives: (In Chart) A copy of your advanced directives are scanned into your chart should your provider ever need it.  Next Medicare Annual Wellness Visit scheduled for next year: Yes

## 2023-03-02 NOTE — Progress Notes (Signed)
Subjective:   Theresa [name] is a 89 y.o. female who presents for Medicare Annual (Subsequent) preventive examination.  Visit Complete: In person      Objective:    Today's Vitals   03/02/23 1456 03/02/23 1503  BP: 118/74   Pulse: 60   Temp: 98.5 F (36.9 C)   SpO2: 98%   Weight: 99 lb 12.8 oz (45.3 kg)   PainSc:  7    Body mass index is 19.49 kg/m.     03/02/2023    3:17 PM 12/12/2020    3:11 PM 06/27/2019    2:13 PM 10/30/2017   10:12 AM 10/29/2016   10:27 AM 06/24/2016   10:19 AM 09/28/2015   11:37 AM  Advanced Directives  Does Patient Have a Medical Advance Directive? Yes Yes Yes Yes Yes Yes Yes  Type of Estate agent of [place];Living will Healthcare Power of [place];Living will Living will;Healthcare Power of [employer] Power of [place];Living will  Healthcare Power of [place];Living will   Does patient want to make changes to medical advance directive? No - Patient declined No - Patient declined No - Patient declined   No - Patient declined   Copy of Healthcare Power of Attorney in Chart? Yes - validated most recent copy scanned in chart (See row information) No - copy requested Yes - validated most recent copy scanned in chart (See row information) Yes  Yes     Current Medications (verified) Outpatient Encounter Medications as of 03/02/2023  Medication Sig   Acetaminophen (TYLENOL ARTHRITIS PAIN PO) Take by mouth.   Calcium Carbonate-Vitamin D 600-400 MG-UNIT tablet Take 1 tablet by mouth 2 (two) times daily.   escitalopram (LEXAPRO) 10 MG tablet Take 1 tablet (10 mg total) by mouth daily.   glucosamine-chondroitin 500-400 MG tablet Take 1 tablet by mouth 2 (two) times daily.   levothyroxine (SYNTHROID) 75 MCG tablet TAKE 1 TABLET BY MOUTH EVERY DAY BEFORE BREAKFAST   loratadine (CLARITIN) 10 MG tablet Take 1 tablet (10 mg total) by mouth daily.   metoprolol tartrate (LOPRESSOR) 100 MG tablet TAKE 1 TABLET BY MOUTH TWICE  DAILY. GENERIC EQUIVALENT FOR LOPRESSOR   Multiple Vitamins-Minerals (PRESERVISION AREDS 2 PO) Take 1 tablet by mouth 2 (two) times daily. Reported on 09/04/2015   rosuvastatin (CRESTOR) 10 MG tablet TAKE 1 TABLET BY MOUTH DAILY. GENERIC EQUIVALENT FOR CRESTOR   vitamin A 56213 UNIT capsule Take 10,000 Units by mouth daily.   fluticasone (FLONASE) 50 MCG/ACT nasal spray Place 2 sprays into both nostrils daily. (Patient not taking: Reported on 03/02/2023)   Rivaroxaban (XARELTO) 15 MG TABS tablet Take 1 tablet (15 mg total) by mouth daily with supper. (Patient not taking: Reported on 03/02/2023)   Saline (SIMPLY SALINE) 0.9 % AERS Place 2 each into the nose daily as needed. (Patient not taking: Reported on 03/02/2023)   [DISCONTINUED] BIOTIN PO Take 1 tablet by mouth daily.   No facility-administered encounter medications on file as of 03/02/2023.    Allergies (verified) Rofecoxib   History: Past Medical History:  Diagnosis Date   Atrial fibrillation (HCC)    AFIB   Collagenous colitis    CVA (cerebral vascular accident) (HCC)    a.  L parietal CVA by MRI 12/12, likely embolic;  b.  TEE by report with neg bubble study;  c.  carotid dopplers  12/12:  + plaque, no sig ICA stenosis    History of postmenopausal HRT    Hx of adenomatous colonic polyps  Hypothyroidism    MVP (mitral valve prolapse)    a. s/p MV repair at [facility] in 2008;  b. Echocardiogram 7/12: EF 50%, status post mitral valve repair with mild MR and minimal MS, mean gradient 4, moderate LAE, LA diam 55 mm; mild RAE, PASP 28-32;    Osteoarthritis    Osteoporosis    Past Surgical History:  Procedure Laterality Date   CARDIOVERSION  01/09/2012   Procedure: CARDIOVERSION;  Surgeon: Dolores Patty, MD;  Location[facility] ENDOSCOPY;  Service: Cardiovascular;  Laterality: N/A;   CATARACT EXTRACTION     CHOLECYSTECTOMY     DILATION AND CURETTAGE OF UTERUS     LUMBAR FUSION     MITRAL VALVE REPAIR  2008   TONSILLECTOMY      Family History  Problem Relation Age of Onset   Cancer Father        COLON   Stroke Neg Hx        1ST DEGREE RELATIVE <60   Social History   Socioeconomic History   Marital status: Married    Spouse name: Not on file   Number of children: Not on file   Years of education: Not on file   Highest education level: Master's degree (e.g., MA, MS, MEng, MEd, MSW, MBA)  Occupational History   Occupation: RETIRED    Employer: RETIRED    Comment: Psychiatric nurse  Tobacco Use   Smoking status: Former    Current packs/day: 0.00    Types: Cigarettes    Quit date: 04/28/1966    Years since quitting: 56.8   Smokeless tobacco: Never  Vaping Use   Vaping status: Never Used  Substance and Sexual Activity   Alcohol use: Yes    Alcohol/week: 7.0 standard drinks of alcohol    Types: 7 Glasses of wine per week    Comment: 1 small glass with luch and dinner   Drug use: No   Sexual activity: Not Currently  Other Topics Concern   Not on file  Social History Narrative   Married for 12 years-1st marriage for both. No kids. No pets.       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   Lived in [place] for 10 years prior (masters in music and music education)-undergrad at [employer]: eating out, opera in [place]   Social Drivers of Health   Financial Resource Strain: Low Risk  (03/02/2023)   Overall Financial Resource Strain (CARDIA)    Difficulty of Paying Living Expenses: Not hard at all  Food Insecurity: No Food Insecurity (03/02/2023)   Hunger Vital Sign    Worried About Running Out of Food in the Last Year: Never true    Ran Out of Food in the Last Year: Never true  Transportation Needs: No Transportation Needs (03/02/2023)   PRAPARE - Administrator, Civil Service (Medical): No    Lack of Transportation (Non-Medical): No  Physical Activity: Insufficiently Active (03/02/2023)   Exercise Vital Sign    Days of Exercise per Week: 5 days     Minutes of Exercise per Session: 20 min  Stress: No Stress Concern Present (03/02/2023)   [employer] of Occupational Health - Occupational Stress Questionnaire    Feeling of Stress : Not at all  Social Connections: Moderately Isolated (03/02/2023)   Social Connection and Isolation Panel [NHANES]    Frequency of Communication with Friends and Family: Once a week  Frequency of Social Gatherings with Friends and Family: More than three times a week    Attends Religious Services: Never    Database administrator or Organizations: No    Attends Engineer, structural: Never    Marital Status: Married    Tobacco Counseling Counseling given: Not Answered   Clinical Intake:  Pre-visit preparation completed: Yes  Pain : 0-10 Pain Score: 7  Pain Type: Chronic pain, Other (Comment) (when moving ankle back and forth) Pain Location: Ankle Pain Orientation: Right, Left, Anterior Pain Radiating Towards: toward knees when bending Pain Descriptors / Indicators: Aching, Radiating, Sore, Pressure Pain Onset: More than a month ago Pain Frequency: Occasional Pain Relieving Factors: not moving them  Pain Relieving Factors: not moving them  BMI - recorded: 19.49 Nutritional Status: BMI of 19-24  Normal Nutritional Risks: None Diabetes: No  How often do you need to have someone help you when you read instructions, pamphlets, or other written materials from your doctor or pharmacy?: 1 - Never  Interpreter Needed?: No  Information entered by :: Lanier Ensign, LPN   Activities of Daily Living    03/02/2023    3:10 PM  In your present state of health, do you have any difficulty performing the following activities:  Hearing? 0  Vision? 1  Comment macular degeneration  Difficulty concentrating or making decisions? 0  Walking or climbing stairs? 0  Dressing or bathing? 0  Doing errands, shopping? 0  Preparing Food and eating ? N  Using the Toilet? N  In the past six  months, have you accidently leaked urine? N  Do you have problems with loss of bowel control? N  Managing your Medications? N  Managing your Finances? N  Housekeeping or managing your Housekeeping? N    Patient Care Team: Shelva Majestic, MD as PCP - General (Family Medicine) Wendall Stade, MD as PCP - Cardiology (Cardiology) Mckinley Jewel, MD as Consulting Physician (Ophthalmology) Mitzi Davenport, Minerva Fester, MD as Referring Physician (Ophthalmology) Huston Foley, MD as Consulting Physician (Neurology) Erroll Luna, [facility] (Inactive) as Pharmacist (Pharmacist)  Indicate any recent Medical Services you may have received from other than [facility] providers in the past year (date may be approximate).     Assessment:   This is a routine wellness examination for Theresa [name].  Hearing/Vision screen Hearing Screening - Comments:: Pt denies any hearing issues  Vision Screening - Comments:: Pt follows up with [facility] ophthalmology for annual eye exams    Goals Addressed             This Visit's Progress    Patient Stated       Improve eye sight        Depression Screen    03/02/2023    3:14 PM 05/26/2022   11:09 AM 02/11/2022   11:26 AM 11/25/2021   10:46 AM 05/13/2021   10:20 AM 12/12/2020    3:06 PM 11/07/2020   10:43 AM  PHQ 2/9 Scores  PHQ - 2 Score 0 0 0 1 0 0 0  PHQ- 9 Score  1  1 0 0 0    Fall Risk    03/02/2023    3:17 PM 05/26/2022   11:09 AM 02/11/2022   11:26 AM 11/25/2021   10:46 AM 12/12/2020    3:12 PM  Fall Risk   Falls in the past year? 1 1 1  0 0  Number falls in past yr: 1 1 1  0 0  Injury with Fall? 0  1 1 0 0  Comment   fractured rib    Risk for fall due to : Impaired balance/gait;Impaired mobility;History of fall(s) History of fall(s) No Fall Risks No Fall Risks No Fall Risks  Follow up Falls prevention discussed Falls evaluation completed Falls evaluation completed Falls evaluation completed     MEDICARE RISK AT HOME: Medicare Risk at Home Any  stairs in or around the home?: No If so, are there any without handrails?: No Home free of loose throw rugs in walkways, pet beds, electrical cords, etc?: Yes Adequate lighting in your home to reduce risk of falls?: Yes Life alert?: Yes Use of a cane, walker or w/c?: Yes Grab bars in the bathroom?: Yes Shower chair or bench in shower?: Yes Elevated toilet seat or a handicapped toilet?: Yes  TIMED UP AND GO:  Was the test performed?  Yes  Length of time to ambulate 10 feet: 20 sec Gait slow and steady with assistive device    Cognitive Function:    10/30/2017   10:17 AM 10/29/2016   10:37 AM 09/28/2015   12:30 PM 09/28/2015   11:54 AM 02/15/2014   12:00 PM  MMSE - Mini Mental State Exam  Not completed:   --    Orientation to time 4 5  4 5   Orientation to Place 5 5  5 5   Registration 3 3  3 3   Attention/ Calculation 5 5  5 3   Recall 2 3  2 3   Language- name 2 objects 2 2  2 2   Language- repeat 1 1  1 1   Language- follow 3 step command 3 3  3 3   Language- read & follow direction 1 1  1 1   Write a sentence 1 1  1 1   Copy design 1 1  1 1   Total score 28 30  28 28         03/02/2023    3:19 PM 12/12/2020    3:14 PM 06/27/2019    2:14 PM  6CIT Screen  What Year? 0 points 0 points 0 points  What month? 0 points 0 points 0 points  What time? 0 points 0 points 0 points  Count back from 20 0 points 0 points 0 points  Months in reverse 0 points 0 points 0 points  Repeat phrase 0 points 0 points 0 points  Total Score 0 points 0 points 0 points    Immunizations Immunization History  Administered Date(s) Administered   Fluad Quad(high Dose 65+) 11/30/2018, 01/17/2020   Influenza Split 12/17/2010, 12/02/2011   Influenza Whole 03/17/2001, 12/18/2006, 12/28/2007, 01/03/2009, 12/26/2009   Influenza, High Dose Seasonal PF 12/22/2012, 03/06/2015, 11/26/2015, 12/09/2017   Influenza,inj,Quad PF,6+ Mos 11/23/2013   Influenza-Unspecified 11/20/2016   PFIZER(Purple Top)SARS-COV-2  Vaccination 06/09/2019, 06/30/2019, 02/16/2020   Pneumococcal Conjugate-13 03/02/2014   Pneumococcal Polysaccharide-23 03/17/2000, 09/16/2006   Td 03/18/1995, 09/15/2006, 02/22/2021   Tdap 12/17/2010   Unspecified SARS-COV-2 Vaccination 12/08/2020   Zoster Recombinant(Shingrix) 09/18/2016, 11/20/2016    TDAP status: Up to date  Flu Vaccine status: Due, Education has been provided regarding the importance of this vaccine. Advised may receive this vaccine at local pharmacy or Health Dept. Aware to provide a copy of the vaccination record if obtained from local pharmacy or Health Dept. Verbalized acceptance and understanding.  Pneumococcal vaccine status: Up to date  Covid-19 vaccine status: Information provided on how to obtain vaccines.   Qualifies for Shingles Vaccine? Yes   Zostavax completed Yes   Shingrix Completed?: Yes  Screening Tests Health Maintenance  Topic Date Due   INFLUENZA VACCINE  10/16/2022   COVID-19 Vaccine (5 - 2024-25 season) 11/16/2022   Medicare Annual Wellness (AWV)  03/01/2024   DTaP/Tdap/Td (5 - Td or Tdap) 02/23/2031   Pneumonia Vaccine 61+ Years old  Completed   DEXA SCAN  Completed   Zoster Vaccines- Shingrix  Completed   HPV VACCINES  Aged Out    Health Maintenance  Health Maintenance Due  Topic Date Due   INFLUENZA VACCINE  10/16/2022   COVID-19 Vaccine (5 - 2024-25 season) 11/16/2022    Colorectal cancer screening: No longer required.   Mammogram status: No longer required due to age .  Bone Density status: Completed 11/12/20. Results reflect: Bone density results: OSTEOPOROSIS. Repeat every 2 years.   Additional Screening:  Vision Screening: Recommended annual ophthalmology exams for early detection of glaucoma and other disorders of the eye. Is the patient up to date with their annual eye exam?  Yes  Who is the provider or what is the name of the office in which the patient attends annual eye exams? [facility] ophthalmology  If pt  is not established with a provider, would they like to be referred to a provider to establish care? No .   Dental Screening: Recommended annual dental exams for proper oral hygiene    Community Resource Referral / Chronic Care Management: CRR required this visit?  No   CCM required this visit?  No     Plan:     I have personally reviewed and noted the following in the patient's chart:   Medical and social history Use of alcohol, tobacco or illicit drugs  Current medications and supplements including opioid prescriptions. Patient is not currently taking opioid prescriptions. Functional ability and status Nutritional status Physical activity Advanced directives List of other physicians Hospitalizations, surgeries, and ER visits in previous 12 months Vitals Screenings to include cognitive, depression, and falls Referrals and appointments  In addition, I have reviewed and discussed with patient certain preventive protocols, quality metrics, and best practice recommendations. A written personalized care plan for preventive services as well as general preventive health recommendations were provided to patient.     Marzella Schlein, LPN   32/20/2542   After Visit Summary: (MyChart) Due to this being a telephonic visit, the after visit summary with patients personalized plan was offered to patient via MyChart   Nurse Notes: none

## 2023-03-03 ENCOUNTER — Encounter (HOSPITAL_COMMUNITY): Payer: Self-pay

## 2023-03-03 ENCOUNTER — Inpatient Hospital Stay (HOSPITAL_COMMUNITY)
Admission: EM | Admit: 2023-03-03 | Discharge: 2023-03-09 | DRG: 522 | Disposition: A | Discharge status: Skilled Nursing Facility | Payer: Medicare Other | Attending: Internal Medicine | Admitting: Internal Medicine

## 2023-03-03 ENCOUNTER — Other Ambulatory Visit: Payer: Self-pay

## 2023-03-03 ENCOUNTER — Inpatient Hospital Stay (HOSPITAL_COMMUNITY): Payer: Medicare Other

## 2023-03-03 ENCOUNTER — Emergency Department (HOSPITAL_COMMUNITY): Payer: Medicare Other

## 2023-03-03 DIAGNOSIS — I4819 Other persistent atrial fibrillation: Secondary | ICD-10-CM | POA: Diagnosis present

## 2023-03-03 DIAGNOSIS — Z981 Arthrodesis status: Secondary | ICD-10-CM

## 2023-03-03 DIAGNOSIS — I129 Hypertensive chronic kidney disease with stage 1 through stage 4 chronic kidney disease, or unspecified chronic kidney disease: Secondary | ICD-10-CM | POA: Diagnosis not present

## 2023-03-03 DIAGNOSIS — F32A Depression, unspecified: Secondary | ICD-10-CM | POA: Insufficient documentation

## 2023-03-03 DIAGNOSIS — Z5986 Financial insecurity: Secondary | ICD-10-CM

## 2023-03-03 DIAGNOSIS — S72001D Fracture of unspecified part of neck of right femur, subsequent encounter for closed fracture with routine healing: Secondary | ICD-10-CM | POA: Diagnosis not present

## 2023-03-03 DIAGNOSIS — E871 Hypo-osmolality and hyponatremia: Secondary | ICD-10-CM | POA: Diagnosis not present

## 2023-03-03 DIAGNOSIS — I482 Chronic atrial fibrillation, unspecified: Secondary | ICD-10-CM | POA: Diagnosis present

## 2023-03-03 DIAGNOSIS — Z7401 Bed confinement status: Secondary | ICD-10-CM | POA: Diagnosis not present

## 2023-03-03 DIAGNOSIS — D72829 Elevated white blood cell count, unspecified: Secondary | ICD-10-CM | POA: Diagnosis not present

## 2023-03-03 DIAGNOSIS — T45516A Underdosing of anticoagulants, initial encounter: Secondary | ICD-10-CM | POA: Diagnosis not present

## 2023-03-03 DIAGNOSIS — F05 Delirium due to known physiological condition: Secondary | ICD-10-CM | POA: Diagnosis not present

## 2023-03-03 DIAGNOSIS — Z8673 Personal history of transient ischemic attack (TIA), and cerebral infarction without residual deficits: Secondary | ICD-10-CM | POA: Diagnosis not present

## 2023-03-03 DIAGNOSIS — E039 Hypothyroidism, unspecified: Secondary | ICD-10-CM | POA: Diagnosis present

## 2023-03-03 DIAGNOSIS — Z79899 Other long term (current) drug therapy: Secondary | ICD-10-CM

## 2023-03-03 DIAGNOSIS — Z9049 Acquired absence of other specified parts of digestive tract: Secondary | ICD-10-CM

## 2023-03-03 DIAGNOSIS — M810 Age-related osteoporosis without current pathological fracture: Secondary | ICD-10-CM | POA: Diagnosis not present

## 2023-03-03 DIAGNOSIS — Y92000 Kitchen of unspecified non-institutional (private) residence as  the place of occurrence of the external cause: Secondary | ICD-10-CM | POA: Diagnosis not present

## 2023-03-03 DIAGNOSIS — S72041A Displaced fracture of base of neck of right femur, initial encounter for closed fracture: Secondary | ICD-10-CM | POA: Diagnosis not present

## 2023-03-03 DIAGNOSIS — Z87891 Personal history of nicotine dependence: Secondary | ICD-10-CM

## 2023-03-03 DIAGNOSIS — W010XXA Fall on same level from slipping, tripping and stumbling without subsequent striking against object, initial encounter: Secondary | ICD-10-CM | POA: Diagnosis not present

## 2023-03-03 DIAGNOSIS — Z79890 Hormone replacement therapy (postmenopausal): Secondary | ICD-10-CM

## 2023-03-03 DIAGNOSIS — R531 Weakness: Secondary | ICD-10-CM | POA: Diagnosis not present

## 2023-03-03 DIAGNOSIS — N183 Chronic kidney disease, stage 3 unspecified: Secondary | ICD-10-CM | POA: Diagnosis present

## 2023-03-03 DIAGNOSIS — E785 Hyperlipidemia, unspecified: Secondary | ICD-10-CM | POA: Diagnosis not present

## 2023-03-03 DIAGNOSIS — Z888 Allergy status to other drugs, medicaments and biological substances status: Secondary | ICD-10-CM

## 2023-03-03 DIAGNOSIS — W1809XA Striking against other object with subsequent fall, initial encounter: Secondary | ICD-10-CM | POA: Diagnosis present

## 2023-03-03 DIAGNOSIS — I10 Essential (primary) hypertension: Secondary | ICD-10-CM | POA: Diagnosis present

## 2023-03-03 DIAGNOSIS — S72001A Fracture of unspecified part of neck of right femur, initial encounter for closed fracture: Principal | ICD-10-CM

## 2023-03-03 DIAGNOSIS — N1831 Chronic kidney disease, stage 3a: Secondary | ICD-10-CM | POA: Diagnosis not present

## 2023-03-03 DIAGNOSIS — S72011A Unspecified intracapsular fracture of right femur, initial encounter for closed fracture: Principal | ICD-10-CM | POA: Diagnosis present

## 2023-03-03 DIAGNOSIS — R609 Edema, unspecified: Secondary | ICD-10-CM | POA: Diagnosis not present

## 2023-03-03 DIAGNOSIS — E038 Other specified hypothyroidism: Secondary | ICD-10-CM | POA: Diagnosis not present

## 2023-03-03 DIAGNOSIS — I480 Paroxysmal atrial fibrillation: Secondary | ICD-10-CM | POA: Insufficient documentation

## 2023-03-03 DIAGNOSIS — M8580 Other specified disorders of bone density and structure, unspecified site: Secondary | ICD-10-CM | POA: Diagnosis present

## 2023-03-03 DIAGNOSIS — R0689 Other abnormalities of breathing: Secondary | ICD-10-CM | POA: Diagnosis not present

## 2023-03-03 DIAGNOSIS — Z860101 Personal history of adenomatous and serrated colon polyps: Secondary | ICD-10-CM

## 2023-03-03 DIAGNOSIS — Z91120 Patient's intentional underdosing of medication regimen due to financial hardship: Secondary | ICD-10-CM

## 2023-03-03 DIAGNOSIS — N179 Acute kidney failure, unspecified: Secondary | ICD-10-CM | POA: Diagnosis present

## 2023-03-03 DIAGNOSIS — W19XXXA Unspecified fall, initial encounter: Secondary | ICD-10-CM

## 2023-03-03 DIAGNOSIS — Z9889 Other specified postprocedural states: Secondary | ICD-10-CM

## 2023-03-03 DIAGNOSIS — Z91048 Other nonmedicinal substance allergy status: Secondary | ICD-10-CM | POA: Diagnosis not present

## 2023-03-03 DIAGNOSIS — S2242XA Multiple fractures of ribs, left side, initial encounter for closed fracture: Secondary | ICD-10-CM | POA: Diagnosis not present

## 2023-03-03 DIAGNOSIS — M1711 Unilateral primary osteoarthritis, right knee: Secondary | ICD-10-CM | POA: Diagnosis not present

## 2023-03-03 DIAGNOSIS — S72009A Fracture of unspecified part of neck of unspecified femur, initial encounter for closed fracture: Secondary | ICD-10-CM | POA: Diagnosis not present

## 2023-03-03 DIAGNOSIS — I517 Cardiomegaly: Secondary | ICD-10-CM | POA: Diagnosis not present

## 2023-03-03 DIAGNOSIS — Y92009 Unspecified place in unspecified non-institutional (private) residence as the place of occurrence of the external cause: Secondary | ICD-10-CM | POA: Diagnosis not present

## 2023-03-03 DIAGNOSIS — H3530 Unspecified macular degeneration: Secondary | ICD-10-CM | POA: Diagnosis present

## 2023-03-03 DIAGNOSIS — E034 Atrophy of thyroid (acquired): Secondary | ICD-10-CM | POA: Diagnosis not present

## 2023-03-03 DIAGNOSIS — I951 Orthostatic hypotension: Secondary | ICD-10-CM | POA: Diagnosis not present

## 2023-03-03 DIAGNOSIS — Z96641 Presence of right artificial hip joint: Secondary | ICD-10-CM | POA: Diagnosis not present

## 2023-03-03 LAB — CBC WITH DIFFERENTIAL/PLATELET
Abs Immature Granulocytes: 0.05 10*3/uL (ref 0.00–0.07)
Basophils Absolute: 0.1 10*3/uL (ref 0.0–0.1)
Basophils Relative: 1 %
Eosinophils Absolute: 0.1 10*3/uL (ref 0.0–0.5)
Eosinophils Relative: 1 %
HCT: 41 % (ref 36.0–46.0)
Hemoglobin: 13.4 g/dL (ref 12.0–15.0)
Immature Granulocytes: 1 %
Lymphocytes Relative: 10 %
Lymphs Abs: 1.1 10*3/uL (ref 0.7–4.0)
MCH: 33.4 pg (ref 26.0–34.0)
MCHC: 32.7 g/dL (ref 30.0–36.0)
MCV: 102.2 fL — ABNORMAL HIGH (ref 80.0–100.0)
Monocytes Absolute: 0.6 10*3/uL (ref 0.1–1.0)
Monocytes Relative: 6 %
Neutro Abs: 9 10*3/uL — ABNORMAL HIGH (ref 1.7–7.7)
Neutrophils Relative %: 81 %
Platelets: 150 10*3/uL (ref 150–400)
RBC: 4.01 MIL/uL (ref 3.87–5.11)
RDW: 13.4 % (ref 11.5–15.5)
WBC: 10.9 10*3/uL — ABNORMAL HIGH (ref 4.0–10.5)
nRBC: 0 % (ref 0.0–0.2)

## 2023-03-03 LAB — COMPREHENSIVE METABOLIC PANEL
ALT: 42 U/L (ref 0–44)
AST: 29 U/L (ref 15–41)
Albumin: 3.6 g/dL (ref 3.5–5.0)
Alkaline Phosphatase: 81 U/L (ref 38–126)
Anion gap: 8 (ref 5–15)
BUN: 34 mg/dL — ABNORMAL HIGH (ref 8–23)
CO2: 25 mmol/L (ref 22–32)
Calcium: 8.6 mg/dL — ABNORMAL LOW (ref 8.9–10.3)
Chloride: 103 mmol/L (ref 98–111)
Creatinine, Ser: 0.7 mg/dL (ref 0.44–1.00)
GFR, Estimated: >60 mL/min (ref >60)
Glucose, Bld: 106 mg/dL — ABNORMAL HIGH (ref 70–99)
Potassium: 3.5 mmol/L (ref 3.5–5.1)
Sodium: 136 mmol/L (ref 135–145)
Total Bilirubin: 0.7 mg/dL (ref <1.2)
Total Protein: 5.9 g/dL — ABNORMAL LOW (ref 6.5–8.1)

## 2023-03-03 LAB — PROTIME-INR
INR: 1.1 (ref 0.8–1.2)
Prothrombin Time: 13.9 s (ref 11.4–15.2)

## 2023-03-03 MED ORDER — HYDROMORPHONE HCL 1 MG/ML IJ SOLN: 0.2500 mg | Freq: Once | INTRAMUSCULAR | Status: DC

## 2023-03-03 MED ORDER — ESCITALOPRAM OXALATE 10 MG PO TABS
10.0000 mg | ORAL_TABLET | Freq: Every day | ORAL | Status: DC
  Administered 2023-03-05 – 2023-03-09 (×5): 10 mg via ORAL
  Filled 2023-03-03 (×5): qty 1

## 2023-03-03 MED ORDER — LEVOTHYROXINE SODIUM 75 MCG PO TABS
75.0000 ug | ORAL_TABLET | Freq: Every day | ORAL | Status: DC
  Administered 2023-03-06 – 2023-03-09 (×4): 75 ug via ORAL
  Filled 2023-03-03 (×5): qty 1

## 2023-03-03 MED ORDER — ONDANSETRON HCL 4 MG/2ML IJ SOLN: 4.0000 mg | Freq: Four times a day (QID) | INTRAMUSCULAR | Status: DC | PRN

## 2023-03-03 MED ORDER — ROSUVASTATIN CALCIUM 10 MG PO TABS
10.0000 mg | ORAL_TABLET | Freq: Every day | ORAL | Status: DC
  Administered 2023-03-06 – 2023-03-09 (×4): 10 mg via ORAL
  Filled 2023-03-03 (×4): qty 1

## 2023-03-03 MED ORDER — METHOCARBAMOL 1000 MG/10ML IJ SOLN
500.0000 mg | Freq: Four times a day (QID) | INTRAMUSCULAR | Status: DC | PRN
Start: 2023-03-03 — End: 2023-03-07

## 2023-03-03 MED ORDER — HYDROCODONE-ACETAMINOPHEN 5-325 MG PO TABS
1.0000 | ORAL_TABLET | Freq: Four times a day (QID) | ORAL | Status: DC | PRN
Start: 2023-03-03 — End: 2023-03-07

## 2023-03-03 MED ORDER — METOPROLOL TARTRATE 50 MG PO TABS
100.0000 mg | ORAL_TABLET | Freq: Two times a day (BID) | ORAL | Status: DC
  Administered 2023-03-04 – 2023-03-05 (×4): 100 mg via ORAL
  Filled 2023-03-03 (×5): qty 2

## 2023-03-03 MED ORDER — MORPHINE SULFATE (PF) 2 MG/ML IV SOLN: 0.5000 mg | INTRAVENOUS | Status: DC | PRN

## 2023-03-03 MED ORDER — METHOCARBAMOL 500 MG PO TABS
500.0000 mg | ORAL_TABLET | Freq: Four times a day (QID) | ORAL | Status: DC | PRN
Start: 2023-03-03 — End: 2023-03-07

## 2023-03-03 MED ORDER — METOPROLOL TARTRATE 25 MG PO TABS
100.0000 mg | ORAL_TABLET | Freq: Once | ORAL | Status: AC
  Administered 2023-03-03: 100 mg via ORAL
  Filled 2023-03-03: qty 4

## 2023-03-03 NOTE — H&P (Signed)
History and Physical    Patient: Theresa [name] YQI:[id number] DOB: 28-Jun-1931 DOA: 03/03/2023 DOS: the patient was seen and examined on 03/03/2023 PCP: Shelva Majestic, MD  Patient coming from: ALF/ILF  Chief Complaint:  Chief Complaint  Patient presents with   Hip Pain    HPI: Theresa [name] is a 89 y.o. female with medical history significant for Mitral valve repair, hypothyroidism, CKD 3, osteoarthritis, HTN, HLD, prior stroke 2012, paroxysmal A-fib, previously on Xarelto (suspect mistakenly self discontinued 09/25/2022,-cardiology note from 7/11 stated DC digoxin and decrease Xarelto dose to 15 mg), who at baseline is independent, ambulant with cane/walker due to problems with balance and impaired vision from macular degeneration, who presents from independent living with an accidental fall onto her right side resulting in a right hip fracture.  She did not hit her head and had no loss of consciousness.  She was able to get up but had difficulty bearing weight.  She denied preceding lightheadedness, one-sided weakness numbness or tingling, visual disturbance, palpitations, chest pain or shortness of breath and was previously in her usual state of health.  Denies recent illness no recent nausea, vomiting, change in bowel habit, abdominal pain or dysuria or fever or chills. ED course and data review: Noted to be in rapid A-fib on arrival with rate up to 117 that improved with the night dose of metoprolol to the 80s.  BP 155/89,. Workup in the ED still pending but so far notable for WBC 10,900, normal hemoglobin of 13.4. CMP pending EKG, personally viewed and interpreted showing A-fib at 92 with nonspecific ST-T wave changes Hip x-ray showing acute right femoral neck fracture with mild impaction  The ED provider spoke with orthopedist, Dr. Victorino Dike who will take patient to the OR on 12/18.  Patient treated with Dilaudid.  Given her nighttime dose of metoprolol as mentioned  above  Hospitalist consulted for admission.   Review of Systems: As mentioned in the history of present illness. All other systems reviewed and are negative.  Past Medical History:  Diagnosis Date   Atrial fibrillation (HCC)    AFIB   Collagenous colitis    CVA (cerebral vascular accident) (HCC)    a.  L parietal CVA by MRI 12/12, likely embolic;  b.  TEE by report with neg bubble study;  c.  carotid dopplers  12/12:  + plaque, no sig ICA stenosis    History of postmenopausal HRT    Hx of adenomatous colonic polyps    Hypothyroidism    MVP (mitral valve prolapse)    a. s/p MV repair at [facility] in 2008;  b. Echocardiogram 7/12: EF 50%, status post mitral valve repair with mild MR and minimal MS, mean gradient 4, moderate LAE, LA diam 55 mm; mild RAE, PASP 28-32;    Osteoarthritis    Osteoporosis    Past Surgical History:  Procedure Laterality Date   CARDIOVERSION  01/09/2012   Procedure: CARDIOVERSION;  Surgeon: Dolores Patty, MD;  Location: [facility] ENDOSCOPY;  Service: Cardiovascular;  Laterality: N/A;   CATARACT EXTRACTION     CHOLECYSTECTOMY     DILATION AND CURETTAGE OF UTERUS     LUMBAR FUSION     MITRAL VALVE REPAIR  2008   TONSILLECTOMY     Social History:  reports that she quit smoking about 56 years ago. Her smoking use included cigarettes. She has never used smokeless tobacco. She reports current alcohol use of about 7.0 standard drinks of alcohol per week. She reports  that she does not use drugs.  Allergies  Allergen Reactions   Tape Other (See Comments)    SKIN TEARS AND BRUISES VERY EASILY   Rofecoxib Other (See Comments)    Vioxx- Elevated LFT's    Family History  Problem Relation Age of Onset   Cancer Father        COLON   Stroke Neg Hx        1ST DEGREE RELATIVE <60    Prior to Admission medications   Medication Sig Start Date End Date Taking? Authorizing Provider  Calcium Citrate-Vitamin D (CITRACAL PETITES/VITAMIN D PO) Take 1 tablet by mouth 2 (two)  times daily.   Yes [provider]  escitalopram (LEXAPRO) 10 MG tablet Take 1 tablet (10 mg total) by mouth daily. 09/22/22  Yes Shelva Majestic, MD  Ginkgo Biloba (GINKOBA PO) Take 1 capsule by mouth 2 (two) times daily after a meal.   Yes [provider]  glucosamine-chondroitin 500-400 MG tablet Take 1 tablet by mouth 2 (two) times daily.   Yes [provider]  levothyroxine (SYNTHROID) 75 MCG tablet TAKE 1 TABLET BY MOUTH EVERY DAY BEFORE BREAKFAST Patient taking differently: Take 75 mcg by mouth daily before breakfast. 12/12/22  Yes Shelva Majestic, MD  metoprolol tartrate (LOPRESSOR) 100 MG tablet TAKE 1 TABLET BY MOUTH TWICE DAILY. GENERIC EQUIVALENT FOR LOPRESSOR Patient taking differently: Take 100 mg by mouth 2 (two) times daily. 10/21/22  Yes Shelva Majestic, MD  Multiple Vitamins-Minerals (PRESERVISION AREDS 2 PO) Take 1 capsule by mouth 2 (two) times daily. Reported on 09/04/2015   Yes [provider]  rosuvastatin (CRESTOR) 10 MG tablet TAKE 1 TABLET BY MOUTH DAILY. GENERIC EQUIVALENT FOR CRESTOR Patient taking differently: Take 10 mg by mouth daily. 12/08/22  Yes Shelva Majestic, MD  Saline (SIMPLY SALINE) 0.9 % AERS Place 2 each into the nose daily as needed. Patient taking differently: Place 1 spray into both nostrils as needed (for dryness). 02/11/22  Yes Lula Olszewski, MD  TYLENOL 8 HOUR ARTHRITIS PAIN 650 MG CR tablet Take 650 mg by mouth in the morning.   Yes [provider]  fluticasone (FLONASE) 50 MCG/ACT nasal spray Place 2 sprays into both nostrils daily. Patient not taking: Reported on 03/03/2023 02/11/22   Lula Olszewski, MD  loratadine (CLARITIN) 10 MG tablet Take 1 tablet (10 mg total) by mouth daily. 02/11/22   Lula Olszewski, MD  Rivaroxaban (XARELTO) 15 MG TABS tablet Take 1 tablet (15 mg total) by mouth daily with supper. Patient not taking: Reported on 03/03/2023 09/25/22   Wendall Stade, MD  vitamin A 11914  UNIT capsule Take 10,000 Units by mouth daily.    [provider]    Physical Exam: Vitals:   03/03/23 1800 03/03/23 1900 03/03/23 2020 03/03/23 2100  BP: 137/88 (!) 151/98 (!) 157/92 127/73  Pulse: 98 (!) 117 88 84  Resp: 16 15 18 18   Temp:      TempSrc:      SpO2: 97% 93% 95% 97%   Physical Exam  Labs on Admission: I have personally reviewed following labs and imaging studies  CBC: Recent Labs  Lab 03/03/23 2124  WBC 10.9*  NEUTROABS 9.0*  HGB 13.4  HCT 41.0  MCV 102.2*  PLT 150   Basic Metabolic Panel: No results for input(s): "NA", "K", "CL", "CO2", "GLUCOSE", "BUN", "CREATININE", "CALCIUM", "MG", "PHOS" in the last 168 hours. GFR: CrCl cannot be calculated (Patient's most recent  lab result is older than the maximum 21 days allowed.). Liver Function Tests: No results for input(s): "AST", "ALT", "ALKPHOS", "BILITOT", "PROT", "ALBUMIN" in the last 168 hours. No results for input(s): "LIPASE", "AMYLASE" in the last 168 hours. No results for input(s): "AMMONIA" in the last 168 hours. Coagulation Profile: No results for input(s): "INR", "PROTIME" in the last 168 hours. Cardiac Enzymes: No results for input(s): "CKTOTAL", "CKMB", "CKMBINDEX", "TROPONINI" in the last 168 hours. BNP (last 3 results) No results for input(s): "PROBNP" in the last 8760 hours. HbA1C: No results for input(s): "HGBA1C" in the last 72 hours. CBG: No results for input(s): "GLUCAP" in the last 168 hours. Lipid Profile: No results for input(s): "CHOL", "HDL", "LDLCALC", "TRIG", "CHOLHDL", "LDLDIRECT" in the last 72 hours. Thyroid Function Tests: No results for input(s): "TSH", "T4TOTAL", "FREET4", "T3FREE", "THYROIDAB" in the last 72 hours. Anemia Panel: No results for input(s): "VITAMINB12", "FOLATE", "FERRITIN", "TIBC", "IRON", "RETICCTPCT" in the last 72 hours. Urine analysis:    Component Value Date/Time   COLORURINE YELLOW 03/21/2013 1242   APPEARANCEUR CLEAR 03/21/2013 1242    LABSPEC 1.007 03/21/2013 1242   PHURINE 6.5 03/21/2013 1242   GLUCOSEU NEGATIVE 03/21/2013 1242   HGBUR NEGATIVE 03/21/2013 1242   HGBUR negative 12/19/2009 0945   BILIRUBINUR SMALL (A) 03/21/2013 1242   BILIRUBINUR 2+ 01/13/2013 1027   KETONESUR NEGATIVE 03/21/2013 1242   PROTEINUR NEGATIVE 03/21/2013 1242   UROBILINOGEN 0.2 03/21/2013 1242   NITRITE NEGATIVE 03/21/2013 1242   LEUKOCYTESUR NEGATIVE 03/21/2013 1242    Radiological Exams on Admission: DG Hip Unilat W or Wo Pelvis 2-3 Views Right Result Date: 03/03/2023 CLINICAL DATA:  Fall, pain EXAM: DG HIP (WITH OR WITHOUT PELVIS) 2-3V RIGHT; RIGHT FEMUR 2 VIEWS COMPARISON:  02/18/2018 FINDINGS: Acute fracture of the right femoral neck with mild impaction. Fracture appears to be primarily subcapital in location. No evidence of fracture involvement of the intertrochanteric region. Hip joint intact without dislocation. Degenerative changes of the knee. Soft tissue swelling is evident at the hip. IMPRESSION: Acute fracture of the right femoral neck with mild impaction. Electronically Signed   By: Duanne Guess D.O.   On: 03/03/2023 20:59   DG FEMUR, MIN 2 VIEWS RIGHT Result Date: 03/03/2023 CLINICAL DATA:  Fall, pain EXAM: DG HIP (WITH OR WITHOUT PELVIS) 2-3V RIGHT; RIGHT FEMUR 2 VIEWS COMPARISON:  02/18/2018 FINDINGS: Acute fracture of the right femoral neck with mild impaction. Fracture appears to be primarily subcapital in location. No evidence of fracture involvement of the intertrochanteric region. Hip joint intact without dislocation. Degenerative changes of the knee. Soft tissue swelling is evident at the hip. IMPRESSION: Acute fracture of the right femoral neck with mild impaction. Electronically Signed   By: Duanne Guess D.O.   On: 03/03/2023 20:59     Data Reviewed: Relevant notes from primary care and specialist visits, past discharge summaries as available in EHR, including Care Everywhere. Prior diagnostic testing as  pertinent to current admission diagnoses Updated medications and problem lists for reconciliation ED course, including vitals, labs, imaging, treatment and response to treatment Triage notes, nursing and pharmacy notes and ED provider's notes Notable results as noted in HPI   Assessment and Plan: * Fracture of femoral neck, right, closed (HCC) Accidental fall Preoperative clearance Pain control, n.p.o. from midnight except meds SCD for DVT prophylaxis until 24 hours postop per Ortho preference Patient is functionally independent with A-fib and history of mitral valve repair on prior stroke, last seen by cardiology 09/2022.  No history of  CAD, PAD, CHF COPD sleep apnea Low risk of perioperative cardiopulmonary events and benefits of surgery outweigh risks so okay to proceed with orthopedic repair Further management per Ortho  Paroxysmal atrial fibrillation (HCC) Heart rate was 117 in the ED, likely triggered by pain, achieving rate control with home dose of metoprolol Previously on Xarelto (self discontinued 09/25/2022, possibly mistakenly-cardiology note stated DC digoxin and decrease Xarelto dose to 15 mg) Patient has not taken Xarelto since 7/11 Continue metoprolol Can consider cardiology consult regarding resumption at discharge/postoperatively  Depression Continue lexapro  Macular degeneration Increased nursing assistance as needed for transfers Fall precautions  CKD (chronic kidney disease), stage III (HCC) Pending CMP  Hypertension Continue home metoprolol  Hx of ischemic left MCA stroke Continue Crestor Patient off Xarelto since July To likely resume at discharge  History of mitral valve repair No acute issues suspected  Hypothyroidism Continue levothyroxine        DVT prophylaxis: SCD  Consults: ortho, Dr Victorino Dike  Advance Care Planning:   Code Status: Prior   Family Communication: none  Disposition Plan: Back to previous home environment  Severity  of Illness: The appropriate patient status for this patient is INPATIENT. Inpatient status is judged to be reasonable and necessary in order to provide the required intensity of service to ensure the patient's safety. The patient's presenting symptoms, physical exam findings, and initial radiographic and laboratory data in the context of their chronic comorbidities is felt to place them at high risk for further clinical deterioration. Furthermore, it is not anticipated that the patient will be medically stable for discharge from the hospital within 2 midnights of admission.   * I certify that at the point of admission it is my clinical judgment that the patient will require inpatient hospital care spanning beyond 2 midnights from the point of admission due to high intensity of service, high risk for further deterioration and high frequency of surveillance required.*  Author: Andris Baumann, MD 03/03/2023 10:18 PM  For on call review [email or web address].

## 2023-03-03 NOTE — Assessment & Plan Note (Signed)
Pending CMP. 

## 2023-03-03 NOTE — ED Provider Notes (Signed)
[facility]GENCY DEPARTMENT AT [facility] Provider Note   CSN: [id number] Arrival date & time: 03/03/23  1743     History  Chief Complaint  Patient presents with   Hip Pain    Theresa [name] is a 89 y.o. female with PMH as listed below who presents BIBA from [facility]--had a mechanical fall earlier today, 7/10 right hip pain and swelling. Did not hit her head or lose consciousness.  Complains of no pain anywhere except her right ASIS area.  She has bared weight since the fall occurred with her walker but with some pain and discomfort.  She went into the kitchen and made lunch while leaning into the counter.  She denies any numbness or tingling to the area. Patient states that she used to take Xarelto but does not take it anymore.  She is unsure if she takes rate control medication.  Patient is found to have rate 80 to 110 bpm irregular heart rate with EMS. Denies CP, SOB, lightheadedness, dizziness.   Past Medical History:  Diagnosis Date   Atrial fibrillation (HCC)    AFIB   Collagenous colitis    CVA (cerebral vascular accident) (HCC)    a.  L parietal CVA by MRI 12/12, likely embolic;  b.  TEE by report with neg bubble study;  c.  carotid dopplers  12/12:  + plaque, no sig ICA stenosis    History of postmenopausal HRT    Hx of adenomatous colonic polyps    Hypothyroidism    MVP (mitral valve prolapse)    a. s/p MV repair at [facility] in 2008;  b. Echocardiogram 7/12: EF 50%, status post mitral valve repair with mild MR and minimal MS, mean gradient 4, moderate LAE, LA diam 55 mm; mild RAE, PASP 28-32;    Osteoarthritis    Osteoporosis        Home Medications Prior to Admission medications   Medication Sig Start Date End Date Taking? Authorizing Provider  Calcium Citrate-Vitamin D (CITRACAL PETITES/VITAMIN D PO) Take 1 tablet by mouth 2 (two) times daily.   Yes [provider]  escitalopram (LEXAPRO) 10 MG tablet Take 1  tablet (10 mg total) by mouth daily. 09/22/22  Yes Shelva Majestic, MD  Ginkgo Biloba (GINKOBA PO) Take 1 capsule by mouth 2 (two) times daily after a meal.   Yes [provider]  glucosamine-chondroitin 500-400 MG tablet Take 1 tablet by mouth 2 (two) times daily.   Yes [provider]  levothyroxine (SYNTHROID) 75 MCG tablet TAKE 1 TABLET BY MOUTH EVERY DAY BEFORE BREAKFAST Patient taking differently: Take 75 mcg by mouth daily before breakfast. 12/12/22  Yes Shelva Majestic, MD  metoprolol tartrate (LOPRESSOR) 100 MG tablet TAKE 1 TABLET BY MOUTH TWICE DAILY. GENERIC EQUIVALENT FOR LOPRESSOR Patient taking differently: Take 100 mg by mouth 2 (two) times daily. 10/21/22  Yes Shelva Majestic, MD  Multiple Vitamins-Minerals (PRESERVISION AREDS 2 PO) Take 1 capsule by mouth 2 (two) times daily. Reported on 09/04/2015   Yes [provider]  rosuvastatin (CRESTOR) 10 MG tablet TAKE 1 TABLET BY MOUTH DAILY. GENERIC EQUIVALENT FOR CRESTOR Patient taking differently: Take 10 mg by mouth daily. 12/08/22  Yes Shelva Majestic, MD  Saline (SIMPLY SALINE) 0.9 % AERS Place 2 each into the nose daily as needed. Patient taking differently: Place 1 spray into both nostrils as needed (for dryness). 02/11/22  Yes Lula Olszewski, MD  TYLENOL 8 HOUR  ARTHRITIS PAIN 650 MG CR tablet Take 650 mg by mouth in the morning.   Yes [provider]  fluticasone (FLONASE) 50 MCG/ACT nasal spray Place 2 sprays into both nostrils daily. Patient not taking: Reported on 03/03/2023 02/11/22   Lula Olszewski, MD  loratadine (CLARITIN) 10 MG tablet Take 1 tablet (10 mg total) by mouth daily. 02/11/22   Lula Olszewski, MD  Rivaroxaban (XARELTO) 15 MG TABS tablet Take 1 tablet (15 mg total) by mouth daily with supper. Patient not taking: Reported on 03/03/2023 09/25/22   Wendall Stade, MD  vitamin A 62130 UNIT capsule Take 10,000 Units by mouth daily.    [provider]       Allergies    Tape and Rofecoxib    Review of Systems   Review of Systems A 10 point review of systems was performed and is negative unless otherwise reported in HPI.  Physical Exam Updated Vital Signs BP (!) 151/72 (BP Location: Left Arm)   Pulse 76   Temp 99 F (37.2 C) (Oral)   Resp 16   LMP  (LMP Unknown)   SpO2 98%  Physical Exam General: Normal appearing elderly female, lying in bed.  HEENT: Sclera anicteric, MMM, trachea midline. NCAT.  Cardiology: RRR, no murmurs/rubs/gallops. Resp: Normal respiratory rate and effort. CTAB, no wheezes, rhonchi, crackles.  Abd: Soft, non-tender, non-distended. No rebound tenderness or guarding.  GU: Deferred. MSK: LEs equal length. TTP over R ASIS. No TTP over femoral head or greater trochanter on R. No deformity noted to R hip or femur/lower leg. Intact ROM of R knee/hip. Intact peripheral pulses. 1+ pitting edema noted symmetric bilateral lower legs. BL DP pulses equal bilaterally.  Skin: warm, dry.  Neuro: A&Ox4, CNs II-XII grossly intact. MAEs. Sensation grossly intact.  Psych: Normal mood and affect.   ED Results / Procedures / Treatments   Labs (all labs ordered are listed, but only abnormal results are displayed) Labs Reviewed  CBC WITH DIFFERENTIAL/PLATELET - Abnormal; Notable for the following components:      Result Value   WBC 10.9 (*)    MCV 102.2 (*)    Neutro Abs 9.0 (*)    All other components within normal limits  COMPREHENSIVE METABOLIC PANEL - Abnormal; Notable for the following components:   Glucose, Bld 106 (*)    BUN 34 (*)    Calcium 8.6 (*)    Total Protein 5.9 (*)    All other components within normal limits  PROTIME-INR    EKG None  Radiology DG Hip Unilat W or Wo Pelvis 2-3 Views Right Result Date: 03/03/2023 CLINICAL DATA:  Fall, pain EXAM: DG HIP (WITH OR WITHOUT PELVIS) 2-3V RIGHT; RIGHT FEMUR 2 VIEWS COMPARISON:  02/18/2018 FINDINGS: Acute fracture of the right femoral neck with mild  impaction. Fracture appears to be primarily subcapital in location. No evidence of fracture involvement of the intertrochanteric region. Hip joint intact without dislocation. Degenerative changes of the knee. Soft tissue swelling is evident at the hip. IMPRESSION: Acute fracture of the right femoral neck with mild impaction. Electronically Signed   By: Duanne Guess D.O.   On: 03/03/2023 20:59   DG FEMUR, MIN 2 VIEWS RIGHT Result Date: 03/03/2023 CLINICAL DATA:  Fall, pain EXAM: DG HIP (WITH OR WITHOUT PELVIS) 2-3V RIGHT; RIGHT FEMUR 2 VIEWS COMPARISON:  02/18/2018 FINDINGS: Acute fracture of the right femoral neck with mild impaction. Fracture appears to be primarily subcapital in location. No evidence of fracture involvement of  the intertrochanteric region. Hip joint intact without dislocation. Degenerative changes of the knee. Soft tissue swelling is evident at the hip. IMPRESSION: Acute fracture of the right femoral neck with mild impaction. Electronically Signed   By: Duanne Guess D.O.   On: 03/03/2023 20:59    Procedures Procedures    Medications Ordered in ED Medications  ondansetron (ZOFRAN) injection 4 mg (has no administration in time range)  metoprolol tartrate (LOPRESSOR) tablet 100 mg (has no administration in time range)  rosuvastatin (CRESTOR) tablet 10 mg (has no administration in time range)  escitalopram (LEXAPRO) tablet 10 mg (has no administration in time range)  levothyroxine (SYNTHROID) tablet 75 mcg (has no administration in time range)  HYDROcodone-acetaminophen (NORCO/VICODIN) 5-325 MG per tablet 1-2 tablet (has no administration in time range)  morphine (PF) 2 MG/ML injection 0.5 mg (has no administration in time range)  methocarbamol (ROBAXIN) tablet 500 mg (has no administration in time range)    Or  methocarbamol (ROBAXIN) injection 500 mg (has no administration in time range)  metoprolol tartrate (LOPRESSOR) tablet 100 mg (100 mg Oral Given 03/03/23 2018)     ED Course/ Medical Decision Making/ A&P                          Medical Decision Making Amount and/or Complexity of Data Reviewed Labs: ordered. Radiology: ordered. Decision-making details documented in ED Course.  Risk Prescription drug management. Decision regarding hospitalization.    This patient presents to the ED for concern of fall, R hip pain, tachycardia; this involves an extensive number of treatment options, and is a complaint that carries with it a high risk of complications and morbidity.  I considered the following differential and admission for this acute, potentially life threatening condition.   MDM:    Lower c/f hip fracture given no deformities to LEs and good ROM of hip/knee. Will confirm with XR. Also can consider pelvic fracture. More likely soft tissue contusion, as patient has been able to bear weight since the event.   Patient had stated that she no longer takes Xarelto but per her most recent cardiology note from July of this year, it states that she is supposed to decrease Xarelto to 15 mg daily based on creatinine clearance.  It does not state to discontinue the Xarelto and I am concerned the patient may have misunderstood the instructions. She did D/C digoxin at that time.   She states she did take all her medications this morning.  EKG shows afib. Her rate is anywhere from 90 bpm to 120 bpm.  Dosed her with metoprolol tartrate 100 mg as listed on her home medications and in the cardiology note. Patient's rate did improve.   Clinical Course as of 03/03/23 2350  Tue Mar 03, 2023  2102 DG Hip Unilat W or Wo Pelvis 2-3 Views Right Acute fracture of the right femoral neck with mild impaction. [HN]  2104 Consulted to orthopedic surgery [HN]  2116 D/w Dr. Victorino Dike who will see patient in AM [HN]  2144 Admitted to medicine Dr. Para March.  [HN]    Clinical Course User Index [HN] Loetta Rough, MD    Labs: I Ordered, and personally interpreted labs.  The  pertinent results include:  Unremarkable CMP in context of presentation, WBC 10.9, Hgb 13.4  Imaging Studies ordered: I ordered imaging studies including pelvis/hip/femur XRs I independently visualized and interpreted imaging. I agree with the radiologist interpretation  Additional history obtained from chart  review.   Cardiac Monitoring: The patient was maintained on a cardiac monitor.  I personally viewed and interpreted the cardiac monitored which showed an underlying rhythm of: Atrial fibrillation with intermittent rapid ventriuclar rate  Reevaluation: After the interventions noted above, I reevaluated the patient and found that they have :improved  Social Determinants of Health: Lives independently  Disposition:  admitted to hospitalist  Co morbidities that complicate the patient evaluation  Past Medical History:  Diagnosis Date   Atrial fibrillation (HCC)    AFIB   Collagenous colitis    CVA (cerebral vascular accident) (HCC)    a.  L parietal CVA by MRI 12/12, likely embolic;  b.  TEE by report with neg bubble study;  c.  carotid dopplers  12/12:  + plaque, no sig ICA stenosis    History of postmenopausal HRT    Hx of adenomatous colonic polyps    Hypothyroidism    MVP (mitral valve prolapse)    a. s/p MV repair at [facility] in 2008;  b. Echocardiogram 7/12: EF 50%, status post mitral valve repair with mild MR and minimal MS, mean gradient 4, moderate LAE, LA diam 55 mm; mild RAE, PASP 28-32;    Osteoarthritis    Osteoporosis      Medicines Meds ordered this encounter  Medications   metoprolol tartrate (LOPRESSOR) tablet 100 mg   DISCONTD: HYDROmorphone (DILAUDID) injection 0.25 mg   ondansetron (ZOFRAN) injection 4 mg   metoprolol tartrate (LOPRESSOR) tablet 100 mg   rosuvastatin (CRESTOR) tablet 10 mg   escitalopram (LEXAPRO) tablet 10 mg   levothyroxine (SYNTHROID) tablet 75 mcg   HYDROcodone-acetaminophen (NORCO/VICODIN) 5-325 MG per tablet 1-2 tablet    Refill:   0   morphine (PF) 2 MG/ML injection 0.5 mg   OR Linked Order Group    methocarbamol (ROBAXIN) tablet 500 mg    methocarbamol (ROBAXIN) injection 500 mg    I have reviewed the patients home medicines and have made adjustments as needed  Problem List / ED Course: Problem List Items Addressed This Visit       Musculoskeletal and Integument   * (Principal) Fracture of femoral neck, right, closed (HCC) - Primary   Accidental fall Preoperative clearance Pain control, n.p.o. from midnight except meds SCD for DVT prophylaxis until 24 hours postop per Ortho preference Patient is functionally independent with A-fib and history of mitral valve repair on prior stroke, last seen by cardiology 09/2022.  No history of CAD, PAD, CHF COPD sleep apnea Low risk of perioperative cardiopulmonary events and benefits of surgery outweigh risks so okay to proceed with orthopedic repair Further management per Ortho      Other Visit Diagnoses       Fall in home, initial encounter                       This note was created using dictation software, which may contain spelling or grammatical errors.    Loetta Rough, MD 03/03/23 [phone]

## 2023-03-03 NOTE — Assessment & Plan Note (Signed)
Continue Crestor Patient off Xarelto since July To likely resume at discharge

## 2023-03-03 NOTE — Assessment & Plan Note (Signed)
Continue lexapro  ?

## 2023-03-03 NOTE — Assessment & Plan Note (Signed)
 Continue home metoprolol

## 2023-03-03 NOTE — Assessment & Plan Note (Signed)
Continue levothyroxine 

## 2023-03-03 NOTE — Assessment & Plan Note (Deleted)
Heart rate was 117 in the ED, likely triggered by pain, achieving rate control with home dose of metoprolol Previously on Xarelto (self discontinued 09/25/2022, possibly mistakenly-cardiology note stated DC digoxin and decrease Xarelto dose to 15 mg) Patient has not taken Xarelto since 7/11 Continue metoprolol Can consider cardiology consult regarding resumption at discharge/postoperatively

## 2023-03-03 NOTE — Assessment & Plan Note (Signed)
Heart rate was 117 in the ED, likely triggered by pain, achieving rate control with home dose of metoprolol Self discontinued Xarelto a couple weeks prior-did not refill it because of cost Consider TOC consult to check cost of Eliquis with insurance.  She does not want the inconvenience of Coumadin. For now hold off on full dose anticoagualtion pending or. Treat with ppx dose of lovenox.   continue metoprolol

## 2023-03-03 NOTE — ED Triage Notes (Signed)
BIBA from [facility]--had a fall earlier today, 7/10 right hip pain and swelling, no injury to head. 80-110 irregular HR, pt reports hx of Afib, but is not on any medication 112/70 BP

## 2023-03-03 NOTE — Assessment & Plan Note (Addendum)
Accidental fall Preoperative clearance Pain control, n.p.o. from midnight except meds SCD for DVT prophylaxis until 24 hours postop per Ortho preference Patient is functionally independent with A-fib and history of mitral valve repair on prior stroke, last seen by cardiology 09/2022.  No history of CAD, PAD, CHF COPD sleep apnea Low risk of perioperative cardiopulmonary events and benefits of surgery outweigh risks so okay to proceed with orthopedic repair Further management per Ortho

## 2023-03-03 NOTE — Assessment & Plan Note (Signed)
No acute issues suspected 

## 2023-03-03 NOTE — Assessment & Plan Note (Signed)
Increased nursing assistance as needed for transfers Fall precautions

## 2023-03-04 ENCOUNTER — Encounter (HOSPITAL_COMMUNITY): Payer: Self-pay | Admitting: Anesthesiology

## 2023-03-04 DIAGNOSIS — S72001D Fracture of unspecified part of neck of right femur, subsequent encounter for closed fracture with routine healing: Secondary | ICD-10-CM | POA: Diagnosis not present

## 2023-03-04 LAB — SURGICAL PCR SCREEN
MRSA, PCR: NEGATIVE
Staphylococcus aureus: NEGATIVE

## 2023-03-04 MED ORDER — ENSURE ENLIVE PO LIQD
237.0000 mL | Freq: Two times a day (BID) | ORAL | Status: DC
  Administered 2023-03-04 – 2023-03-09 (×8): 237 mL via ORAL

## 2023-03-04 MED ORDER — ADULT MULTIVITAMIN W/MINERALS CH
1.0000 | ORAL_TABLET | Freq: Every day | ORAL | Status: DC
  Administered 2023-03-04 – 2023-03-09 (×5): 1 via ORAL
  Filled 2023-03-04 (×5): qty 1

## 2023-03-04 MED ORDER — KCL IN DEXTROSE-NACL 20-5-0.45 MEQ/L-%-% IV SOLN
INTRAVENOUS | Status: DC
  Filled 2023-03-04: qty 1000

## 2023-03-04 MED ORDER — ENOXAPARIN SODIUM 40 MG/0.4ML IJ SOSY: 40.0000 mg | PREFILLED_SYRINGE | INTRAMUSCULAR | Status: DC

## 2023-03-04 MED ORDER — OYSTER SHELL CALCIUM/D3 500-5 MG-MCG PO TABS
1.0000 | ORAL_TABLET | Freq: Two times a day (BID) | ORAL | Status: DC
  Administered 2023-03-06 – 2023-03-09 (×6): 1 via ORAL
  Filled 2023-03-04 (×7): qty 1

## 2023-03-04 NOTE — H&P (View-Only) (Signed)
Reason for Consult:Right hip pain Referring Physician: Dr. Vivi Barrack  Theresa [name] is an 89 y.o. female.  HPI: Patient is an 89 year old female, with past medical history significant for mitral valve repair, previous stroke, paroxysmal A-fib for which she is on Xarelto, hypothyroidism, and stage III chronic kidney disease, presented to [facility] emergency department yesterday after sustaining a fall at home.  She states that she stumbled over a stool and fell in the kitchen.  She noted immediate sharp throbbing achy sensation in her right hip.  She was able to bear weight but noted significant pain.  She presented to the emergency department a few hours later.  Plain films were obtained.  Dr. Toni Arthurs was then consulted.  She was admitted to the hospital under the medicine team service.  She notes that she has been off of Xarelto for a few weeks due to financial concerns.  She denies any previous injuries or insults to her right hip.  She denies any prior surgical interventions to her right hip.  She has not a smoker.  Past Medical History:  Diagnosis Date   Atrial fibrillation (HCC)    AFIB   Collagenous colitis    CVA (cerebral vascular accident) (HCC)    a.  L parietal CVA by MRI 12/12, likely embolic;  b.  TEE by report with neg bubble study;  c.  carotid dopplers  12/12:  + plaque, no sig ICA stenosis    History of postmenopausal HRT    Hx of adenomatous colonic polyps    Hypothyroidism    MVP (mitral valve prolapse)    a. s/p MV repair at [facility] in 2008;  b. Echocardiogram 7/12: EF 50%, status post mitral valve repair with mild MR and minimal MS, mean gradient 4, moderate LAE, LA diam 55 mm; mild RAE, PASP 28-32;    Osteoarthritis    Osteoporosis     Past Surgical History:  Procedure Laterality Date   CARDIOVERSION  01/09/2012   Procedure: CARDIOVERSION;  Surgeon: Dolores Patty, MD;  Location: [facility] ENDOSCOPY;  Service: Cardiovascular;  Laterality: N/A;   CATARACT  EXTRACTION     CHOLECYSTECTOMY     DILATION AND CURETTAGE OF UTERUS     LUMBAR FUSION     MITRAL VALVE REPAIR  2008   TONSILLECTOMY      Family History  Problem Relation Age of Onset   Cancer Father        COLON   Stroke Neg Hx        1ST DEGREE RELATIVE <60    Social History:  reports that she quit smoking about 56 years ago. Her smoking use included cigarettes. She has never used smokeless tobacco. She reports current alcohol use of about 7.0 standard drinks of alcohol per week. She reports that she does not use drugs.  Allergies:  Allergies  Allergen Reactions   Tape Other (See Comments)    SKIN TEARS AND BRUISES VERY EASILY   Rofecoxib Other (See Comments)    Vioxx- Elevated LFT's    Medications: I have reviewed the patient's current medications.  Results for orders placed or performed during the hospital encounter of 03/03/23 (from the past 48 hours)  CBC with Differential     Status: Abnormal   Collection Time: 03/03/23  9:24 PM  Result Value Ref Range   WBC 10.9 (H) 4.0 - 10.5 K/uL   RBC 4.01 3.87 - 5.11 MIL/uL   Hemoglobin 13.4 12.0 - 15.0 g/dL   HCT  41.0 36.0 - 46.0 %   MCV 102.2 (H) 80.0 - 100.0 fL   MCH 33.4 26.0 - 34.0 pg   MCHC 32.7 30.0 - 36.0 g/dL   RDW 16.1 09.6 - 04.5 %   Platelets 150 150 - 400 K/uL    Comment: REPEATED TO VERIFY   nRBC 0.0 0.0 - 0.2 %   Neutrophils Relative % 81 %   Neutro Abs 9.0 (H) 1.7 - 7.7 K/uL   Lymphocytes Relative 10 %   Lymphs Abs 1.1 0.7 - 4.0 K/uL   Monocytes Relative 6 %   Monocytes Absolute 0.6 0.1 - 1.0 K/uL   Eosinophils Relative 1 %   Eosinophils Absolute 0.1 0.0 - 0.5 K/uL   Basophils Relative 1 %   Basophils Absolute 0.1 0.0 - 0.1 K/uL   Immature Granulocytes 1 %   Abs Immature Granulocytes 0.05 0.00 - 0.07 K/uL    Comment: Performed at [facility], [address] [place], [place] [address]  Comprehensive metabolic panel     Status: Abnormal   Collection Time: 03/03/23 10:00 PM   Result Value Ref Range   Sodium 136 135 - 145 mmol/L   Potassium 3.5 3.5 - 5.1 mmol/L   Chloride 103 98 - 111 mmol/L   CO2 25 22 - 32 mmol/L   Glucose, Bld 106 (H) 70 - 99 mg/dL    Comment: Glucose reference range applies only to samples taken after fasting for at least 8 hours.   BUN 34 (H) 8 - 23 mg/dL   Creatinine, Ser 1.91 0.44 - 1.00 mg/dL   Calcium 8.6 (L) 8.9 - 10.3 mg/dL   Total Protein 5.9 (L) 6.5 - 8.1 g/dL   Albumin 3.6 3.5 - 5.0 g/dL   AST 29 15 - 41 U/L   ALT 42 0 - 44 U/L   Alkaline Phosphatase 81 38 - 126 U/L   Total Bilirubin 0.7 <1.2 mg/dL   GFR, Estimated >47 >82 mL/min    Comment: (NOTE) Calculated using the CKD-EPI Creatinine Equation (2021)    Anion gap 8 5 - 15    Comment: Performed at [facility], [address], [place], [place] [address]  Protime-INR     Status: None   Collection Time: 03/03/23 10:00 PM  Result Value Ref Range   Prothrombin Time 13.9 11.4 - 15.2 seconds   INR 1.1 0.8 - 1.2    Comment: (NOTE) INR goal varies based on device and disease states. Performed at [facility], [address] [place], [place] [address]     Chest Portable 1 View Result Date: 03/03/2023 CLINICAL DATA:  Fall EXAM: PORTABLE CHEST 1 VIEW COMPARISON:  Chest x-ray 01/27/2022 FINDINGS: Heart is enlarged. Prosthetic heart valve is present. The lungs are clear. There is no pleural effusion or pneumothorax. There are healed left lower rib fractures. IMPRESSION: Cardiomegaly. No acute cardiopulmonary process. Electronically Signed   By: Darliss Cheney M.D.   On: 03/03/2023 23:59   DG Hip Unilat W or Wo Pelvis 2-3 Views Right Result Date: 03/03/2023 CLINICAL DATA:  Fall, pain EXAM: DG HIP (WITH OR WITHOUT PELVIS) 2-3V RIGHT; RIGHT FEMUR 2 VIEWS COMPARISON:  02/18/2018 FINDINGS: Acute fracture of the right femoral neck with mild impaction. Fracture appears to be primarily subcapital in location. No evidence of fracture involvement of  the intertrochanteric region. Hip joint intact without dislocation. Degenerative changes of the knee. Soft tissue swelling is evident at the hip. IMPRESSION: Acute fracture of the  right femoral neck with mild impaction. Electronically Signed   By: Duanne Guess D.O.   On: 03/03/2023 20:59   DG FEMUR, MIN 2 VIEWS RIGHT Result Date: 03/03/2023 CLINICAL DATA:  Fall, pain EXAM: DG HIP (WITH OR WITHOUT PELVIS) 2-3V RIGHT; RIGHT FEMUR 2 VIEWS COMPARISON:  02/18/2018 FINDINGS: Acute fracture of the right femoral neck with mild impaction. Fracture appears to be primarily subcapital in location. No evidence of fracture involvement of the intertrochanteric region. Hip joint intact without dislocation. Degenerative changes of the knee. Soft tissue swelling is evident at the hip. IMPRESSION: Acute fracture of the right femoral neck with mild impaction. Electronically Signed   By: Duanne Guess D.O.   On: 03/03/2023 20:59    ROS:  Gen: Denies fever, chills, weight change, fatigue, night sweats MSK: C/o R lateral hip pain Neuro: Denies headache, numbness, weakness, slurred speech, loss of memory or consciousness Derm: Denies rash, dry skin, scaling or peeling skin change HEENT: Denies blurred vision, double vision, neck stiffness, dysphagia PULM: Denies shortness of breath, cough, sputum production, hemoptysis, wheezing CV: Denies chest pain, edema, orthopnea, palpitations GI: Denies abdominal pain, nausea, vomiting, diarrhea, hematochezia, melena, constipation  Endocrine: Denies hot or cold intolerance, polyuria, polyphagia or appetite change Heme: Denies easy bruising, bleeding, bleeding gums  PE:  Blood pressure 127/75, pulse 97, temperature 98.2 F (36.8 C), temperature source Oral, resp. rate 18, height 5' (1.524 m), weight 47 kg, SpO2 100%. General: WDWN patient in NAD. Psych:  Appropriate mood and affect. Neuro:  A&O x 3, Moving all extremities, sensation intact to light touch HEENT:  EOMs  intact Chest:  Even non-labored respirations Skin:  C/D/I, no rashes or lesions Extremities: warm/dry, mild edema to R hip, no erythema or echymosis.  No lymphadenopathy. Pulses: Popliteus 2+ MSK:  ROM: TKE, MMT: able to perform quad set. Tender to palpation R anterior hip and laterally at greater trochanter.   Assessment/Plan: R femoral neck fx  I explained the severity of this injury to the patient.  This is a painful and unstable injury.  Hopeful to the OR today for definitive treatment.  Continue with NPO status and hold blood thinners.  Alfredo Martinez PA-C [facility] Office:  [phone]   Addendum:  I have seen and examined the patient and agree with findings documented above.  Surgery scheduled for tomorrow.  NPO after midnight.  Please hold blood thinners.  I updated the patient on the treatment plan and timing of surgery.

## 2023-03-04 NOTE — Anesthesia Preprocedure Evaluation (Signed)
Anesthesia Evaluation  Patient identified by MRN, date of birth, ID band Patient awake    Reviewed: Allergy & Precautions, H&P , NPO status , Patient's Chart, lab work & pertinent test results  Airway Mallampati: II   Neck ROM: full    Dental   Pulmonary former smoker   breath sounds clear to auscultation       Cardiovascular hypertension, + dysrhythmias Atrial Fibrillation  Rhythm:regular Rate:Normal     Neuro/Psych  PSYCHIATRIC DISORDERS  Depression    TIACVA    GI/Hepatic ,GERD  ,,  Endo/Other  Hypothyroidism    Renal/GU      Musculoskeletal  (+) Arthritis ,    Abdominal   Peds  Hematology   Anesthesia Other Findings   Reproductive/Obstetrics                             Anesthesia Physical Anesthesia Plan  ASA: 3  Anesthesia Plan: Regional   Post-op Pain Management:    Induction: Intravenous  PONV Risk Score and Plan: 2 and Treatment may vary due to age or medical condition  Airway Management Planned: Nasal Cannula  Additional Equipment:   Intra-op Plan:   Post-operative Plan:   Informed Consent: I have reviewed the patients History and Physical, chart, labs and discussed the procedure including the risks, benefits and alternatives for the proposed anesthesia with the patient or authorized representative who has indicated his/her understanding and acceptance.     Dental advisory given  Plan Discussed with: Anesthesiologist  Anesthesia Plan Comments:        Anesthesia Quick Evaluation

## 2023-03-04 NOTE — Assessment & Plan Note (Signed)
Suspect osteoporois based on fractre in this admision. Check TSH. Calcium and vitamin D suplmentaion ordered

## 2023-03-04 NOTE — Plan of Care (Signed)
  Problem: Education: Goal: Knowledge of General Education information will improve Description: Including pain rating scale, medication(s)/side effects and non-pharmacologic comfort measures Outcome: Progressing   Problem: Activity: Goal: Risk for activity intolerance will decrease Outcome: Progressing   Problem: Pain Management: Goal: General experience of comfort will improve Outcome: Progressing

## 2023-03-04 NOTE — Plan of Care (Signed)

## 2023-03-04 NOTE — Progress Notes (Signed)
Progress Note   Patient: Theresa [name] KVQ:[id number] DOB: [birth year] DOA: 03/03/2023     1 DOS: the patient was seen and examined on 03/04/2023   Brief hospital course: No notes on file  Assessment and Plan: * Fracture of femoral neck, right, closed (HCC) Accidental fall 03/03/2023 Preoperative clearance Pain wel conrolled. Diet per surgery DVT prophylaxis ordred  Per my colleague "Patient is functionally independent with A-fib and history of mitral valve repair on prior stroke, last seen by cardiology 09/2022.  No history of CAD, PAD, CHF COPD sleep apneaLow risk of perioperative cardiopulmonary events and benefits of surgery outweigh risks so okay to proceed with orthopedic repair" Further management per Ortho  Paroxysmal atrial fibrillation (HCC) Heart rate was 117 in the ED, likely triggered by pain, achieving rate control with home dose of metoprolol Self discontinued Xarelto a couple weeks prior-did not refill it because of cost Consider TOC consult to check cost of Eliquis with insurance.  She does not want the inconvenience of Coumadin. For now hold off on full dose anticoagualtion pending or. Treat with ppx dose of lovenox.   continue metoprolol  Depression Continue lexapro  Macular degeneration Increased nursing assistance as needed for transfers Fall precautions  CKD (chronic kidney disease), stage III (HCC) Cr wnl yesteday  Hypertension Continue home metoprolol  Hx of ischemic left MCA stroke Continue Crestor Patient off Xarelto since July To likely resume at discharge  Osteopenia Suspect osteoporois based on fractre in this admision. Check TSH. Calcium and vitamin D suplmentaion ordered  History of mitral valve repair No acute issues suspected  Hypothyroidism Continue levothyroxine        Subjective: Patient reports well-controlled pain in the hip.  On the right side.  Patient reports taking only Tylenol.  Unfortunately patient could not  get to the OR today.  Plan for tentative OR tomorrow  Physical Exam: Vitals:   03/04/23 0228 03/04/23 0611 03/04/23 1030 03/04/23 1429  BP: 138/68 127/75 120/74 107/61  Pulse: 74 97 86 96  Resp: 18 18 17 18   Temp: 98.8 F (37.1 C) 98.2 F (36.8 C) 99.6 F (37.6 C) 99.6 F (37.6 C)  TempSrc: Oral Oral  Oral  SpO2: 96% 100% 100% 97%  Weight:      Height:       General Patient is alert and awake, slight distress from delay in surgery.  Gives a coherent account of the day Respiratory exam: Bilateral intravesicular Cardiovascular exam S1-S2 normal Abdomen soft nontender Extremities warm without edema distal function intact.  I did not manipulate the hip. Data Reviewed:  Labs on Admission:  Results for orders placed or performed during the hospital encounter of 03/03/23 (from the past 24 hours)  CBC with Differential     Status: Abnormal   Collection Time: 03/03/23  9:24 PM  Result Value Ref Range   WBC 10.9 (H) 4.0 - 10.5 K/uL   RBC 4.01 3.87 - 5.11 MIL/uL   Hemoglobin 13.4 12.0 - 15.0 g/dL   HCT 64.3 32.9 - 51.8 %   MCV 102.2 (H) 80.0 - 100.0 fL   MCH 33.4 26.0 - 34.0 pg   MCHC 32.7 30.0 - 36.0 g/dL   RDW 84.1 66.0 - 63.0 %   Platelets 150 150 - 400 K/uL   nRBC 0.0 0.0 - 0.2 %   Neutrophils Relative % 81 %   Neutro Abs 9.0 (H) 1.7 - 7.7 K/uL   Lymphocytes Relative 10 %   Lymphs Abs 1.1 0.7 -  4.0 K/uL   Monocytes Relative 6 %   Monocytes Absolute 0.6 0.1 - 1.0 K/uL   Eosinophils Relative 1 %   Eosinophils Absolute 0.1 0.0 - 0.5 K/uL   Basophils Relative 1 %   Basophils Absolute 0.1 0.0 - 0.1 K/uL   Immature Granulocytes 1 %   Abs Immature Granulocytes 0.05 0.00 - 0.07 K/uL  Comprehensive metabolic panel     Status: Abnormal   Collection Time: 03/03/23 10:00 PM  Result Value Ref Range   Sodium 136 135 - 145 mmol/L   Potassium 3.5 3.5 - 5.1 mmol/L   Chloride 103 98 - 111 mmol/L   CO2 25 22 - 32 mmol/L   Glucose, Bld 106 (H) 70 - 99 mg/dL   BUN 34 (H) 8 - 23 mg/dL    Creatinine, Ser 4.09 0.44 - 1.00 mg/dL   Calcium 8.6 (L) 8.9 - 10.3 mg/dL   Total Protein 5.9 (L) 6.5 - 8.1 g/dL   Albumin 3.6 3.5 - 5.0 g/dL   AST 29 15 - 41 U/L   ALT 42 0 - 44 U/L   Alkaline Phosphatase 81 38 - 126 U/L   Total Bilirubin 0.7 <1.2 mg/dL   GFR, Estimated >81 >19 mL/min   Anion gap 8 5 - 15  Protime-INR     Status: None   Collection Time: 03/03/23 10:00 PM  Result Value Ref Range   Prothrombin Time 13.9 11.4 - 15.2 seconds   INR 1.1 0.8 - 1.2   Basic Metabolic Panel: Recent Labs  Lab 03/03/23 2200  NA 136  K 3.5  CL 103  CO2 25  GLUCOSE 106*  BUN 34*  CREATININE 0.70  CALCIUM 8.6*   Liver Function Tests: Recent Labs  Lab 03/03/23 2200  AST 29  ALT 42  ALKPHOS 81  BILITOT 0.7  PROT 5.9*  ALBUMIN 3.6   No results for input(s): "LIPASE", "AMYLASE" in the last 168 hours. No results for input(s): "AMMONIA" in the last 168 hours. CBC: Recent Labs  Lab 03/03/23 2124  WBC 10.9*  NEUTROABS 9.0*  HGB 13.4  HCT 41.0  MCV 102.2*  PLT 150   Cardiac Enzymes: No results for input(s): "CKTOTAL", "CKMB", "CKMBINDEX", "TROPONINIHS" in the last 168 hours.  BNP (last 3 results) No results for input(s): "PROBNP" in the last 8760 hours. CBG: No results for input(s): "GLUCAP" in the last 168 hours.  Radiological Exams on Admission:  Chest Portable 1 View Result Date: 03/03/2023 CLINICAL DATA:  Fall EXAM: PORTABLE CHEST 1 VIEW COMPARISON:  Chest x-ray 01/27/2022 FINDINGS: Heart is enlarged. Prosthetic heart valve is present. The lungs are clear. There is no pleural effusion or pneumothorax. There are healed left lower rib fractures. IMPRESSION: Cardiomegaly. No acute cardiopulmonary process. Electronically Signed   By: Darliss Cheney M.D.   On: 03/03/2023 23:59   DG Hip Unilat W or Wo Pelvis 2-3 Views Right Result Date: 03/03/2023 CLINICAL DATA:  Fall, pain EXAM: DG HIP (WITH OR WITHOUT PELVIS) 2-3V RIGHT; RIGHT FEMUR 2 VIEWS COMPARISON:  02/18/2018  FINDINGS: Acute fracture of the right femoral neck with mild impaction. Fracture appears to be primarily subcapital in location. No evidence of fracture involvement of the intertrochanteric region. Hip joint intact without dislocation. Degenerative changes of the knee. Soft tissue swelling is evident at the hip. IMPRESSION: Acute fracture of the right femoral neck with mild impaction. Electronically Signed   By: Duanne Guess D.O.   On: 03/03/2023 20:59   DG FEMUR, MIN 2 VIEWS  RIGHT Result Date: 03/03/2023 CLINICAL DATA:  Fall, pain EXAM: DG HIP (WITH OR WITHOUT PELVIS) 2-3V RIGHT; RIGHT FEMUR 2 VIEWS COMPARISON:  02/18/2018 FINDINGS: Acute fracture of the right femoral neck with mild impaction. Fracture appears to be primarily subcapital in location. No evidence of fracture involvement of the intertrochanteric region. Hip joint intact without dislocation. Degenerative changes of the knee. Soft tissue swelling is evident at the hip. IMPRESSION: Acute fracture of the right femoral neck with mild impaction. Electronically Signed   By: Duanne Guess D.O.   On: 03/03/2023 20:59     Disposition: Status is: Inpatient Remains inpatient appropriate because: need ORIF  Planned Discharge Destination: Rehab    Time spent: 35 minutes  Author: Nolberto Hanlon, MD 03/04/2023 3:57 PM  For on call review [email or web address].

## 2023-03-04 NOTE — Consult Note (Addendum)
Reason for Consult:Right hip pain Referring Physician: Dr. Vivi Barrack  Theresa [name] is an 89 y.o. female.  HPI: Patient is an 89 year old female, with past medical history significant for mitral valve repair, previous stroke, paroxysmal A-fib for which she is on Xarelto, hypothyroidism, and stage III chronic kidney disease, presented to [facility] emergency department yesterday after sustaining a fall at home.  She states that she stumbled over a stool and fell in the kitchen.  She noted immediate sharp throbbing achy sensation in her right hip.  She was able to bear weight but noted significant pain.  She presented to the emergency department a few hours later.  Plain films were obtained.  Dr. Toni Arthurs was then consulted.  She was admitted to the hospital under the medicine team service.  She notes that she has been off of Xarelto for a few weeks due to financial concerns.  She denies any previous injuries or insults to her right hip.  She denies any prior surgical interventions to her right hip.  She has not a smoker.  Past Medical History:  Diagnosis Date   Atrial fibrillation (HCC)    AFIB   Collagenous colitis    CVA (cerebral vascular accident) (HCC)    a.  L parietal CVA by MRI 12/12, likely embolic;  b.  TEE by report with neg bubble study;  c.  carotid dopplers  12/12:  + plaque, no sig ICA stenosis    History of postmenopausal HRT    Hx of adenomatous colonic polyps    Hypothyroidism    MVP (mitral valve prolapse)    a. s/p MV repair at [facility] in 2008;  b. Echocardiogram 7/12: EF 50%, status post mitral valve repair with mild MR and minimal MS, mean gradient 4, moderate LAE, LA diam 55 mm; mild RAE, PASP 28-32;    Osteoarthritis    Osteoporosis     Past Surgical History:  Procedure Laterality Date   CARDIOVERSION  01/09/2012   Procedure: CARDIOVERSION;  Surgeon: Dolores Patty, MD;  Location: [facility] ENDOSCOPY;  Service: Cardiovascular;  Laterality: N/A;   CATARACT  EXTRACTION     CHOLECYSTECTOMY     DILATION AND CURETTAGE OF UTERUS     LUMBAR FUSION     MITRAL VALVE REPAIR  2008   TONSILLECTOMY      Family History  Problem Relation Age of Onset   Cancer Father        COLON   Stroke Neg Hx        1ST DEGREE RELATIVE <60    Social History:  reports that she quit smoking about 56 years ago. Her smoking use included cigarettes. She has never used smokeless tobacco. She reports current alcohol use of about 7.0 standard drinks of alcohol per week. She reports that she does not use drugs.  Allergies:  Allergies  Allergen Reactions   Tape Other (See Comments)    SKIN TEARS AND BRUISES VERY EASILY   Rofecoxib Other (See Comments)    Vioxx- Elevated LFT's    Medications: I have reviewed the patient's current medications.  Results for orders placed or performed during the hospital encounter of 03/03/23 (from the past 48 hours)  CBC with Differential     Status: Abnormal   Collection Time: 03/03/23  9:24 PM  Result Value Ref Range   WBC 10.9 (H) 4.0 - 10.5 K/uL   RBC 4.01 3.87 - 5.11 MIL/uL   Hemoglobin 13.4 12.0 - 15.0 g/dL   HCT  41.0 36.0 - 46.0 %   MCV 102.2 (H) 80.0 - 100.0 fL   MCH 33.4 26.0 - 34.0 pg   MCHC 32.7 30.0 - 36.0 g/dL   RDW 16.1 09.6 - 04.5 %   Platelets 150 150 - 400 K/uL    Comment: REPEATED TO VERIFY   nRBC 0.0 0.0 - 0.2 %   Neutrophils Relative % 81 %   Neutro Abs 9.0 (H) 1.7 - 7.7 K/uL   Lymphocytes Relative 10 %   Lymphs Abs 1.1 0.7 - 4.0 K/uL   Monocytes Relative 6 %   Monocytes Absolute 0.6 0.1 - 1.0 K/uL   Eosinophils Relative 1 %   Eosinophils Absolute 0.1 0.0 - 0.5 K/uL   Basophils Relative 1 %   Basophils Absolute 0.1 0.0 - 0.1 K/uL   Immature Granulocytes 1 %   Abs Immature Granulocytes 0.05 0.00 - 0.07 K/uL    Comment: Performed at [facility], [address] [place], [place] [address]  Comprehensive metabolic panel     Status: Abnormal   Collection Time: 03/03/23 10:00 PM   Result Value Ref Range   Sodium 136 135 - 145 mmol/L   Potassium 3.5 3.5 - 5.1 mmol/L   Chloride 103 98 - 111 mmol/L   CO2 25 22 - 32 mmol/L   Glucose, Bld 106 (H) 70 - 99 mg/dL    Comment: Glucose reference range applies only to samples taken after fasting for at least 8 hours.   BUN 34 (H) 8 - 23 mg/dL   Creatinine, Ser 1.91 0.44 - 1.00 mg/dL   Calcium 8.6 (L) 8.9 - 10.3 mg/dL   Total Protein 5.9 (L) 6.5 - 8.1 g/dL   Albumin 3.6 3.5 - 5.0 g/dL   AST 29 15 - 41 U/L   ALT 42 0 - 44 U/L   Alkaline Phosphatase 81 38 - 126 U/L   Total Bilirubin 0.7 <1.2 mg/dL   GFR, Estimated >47 >82 mL/min    Comment: (NOTE) Calculated using the CKD-EPI Creatinine Equation (2021)    Anion gap 8 5 - 15    Comment: Performed at [facility], [address], [place], [place] [address]  Protime-INR     Status: None   Collection Time: 03/03/23 10:00 PM  Result Value Ref Range   Prothrombin Time 13.9 11.4 - 15.2 seconds   INR 1.1 0.8 - 1.2    Comment: (NOTE) INR goal varies based on device and disease states. Performed at [facility], [address] [place], [place] [address]     Chest Portable 1 View Result Date: 03/03/2023 CLINICAL DATA:  Fall EXAM: PORTABLE CHEST 1 VIEW COMPARISON:  Chest x-ray 01/27/2022 FINDINGS: Heart is enlarged. Prosthetic heart valve is present. The lungs are clear. There is no pleural effusion or pneumothorax. There are healed left lower rib fractures. IMPRESSION: Cardiomegaly. No acute cardiopulmonary process. Electronically Signed   By: Darliss Cheney M.D.   On: 03/03/2023 23:59   DG Hip Unilat W or Wo Pelvis 2-3 Views Right Result Date: 03/03/2023 CLINICAL DATA:  Fall, pain EXAM: DG HIP (WITH OR WITHOUT PELVIS) 2-3V RIGHT; RIGHT FEMUR 2 VIEWS COMPARISON:  02/18/2018 FINDINGS: Acute fracture of the right femoral neck with mild impaction. Fracture appears to be primarily subcapital in location. No evidence of fracture involvement of  the intertrochanteric region. Hip joint intact without dislocation. Degenerative changes of the knee. Soft tissue swelling is evident at the hip. IMPRESSION: Acute fracture of the  right femoral neck with mild impaction. Electronically Signed   By: Duanne Guess D.O.   On: 03/03/2023 20:59   DG FEMUR, MIN 2 VIEWS RIGHT Result Date: 03/03/2023 CLINICAL DATA:  Fall, pain EXAM: DG HIP (WITH OR WITHOUT PELVIS) 2-3V RIGHT; RIGHT FEMUR 2 VIEWS COMPARISON:  02/18/2018 FINDINGS: Acute fracture of the right femoral neck with mild impaction. Fracture appears to be primarily subcapital in location. No evidence of fracture involvement of the intertrochanteric region. Hip joint intact without dislocation. Degenerative changes of the knee. Soft tissue swelling is evident at the hip. IMPRESSION: Acute fracture of the right femoral neck with mild impaction. Electronically Signed   By: Duanne Guess D.O.   On: 03/03/2023 20:59    ROS:  Gen: Denies fever, chills, weight change, fatigue, night sweats MSK: C/o R lateral hip pain Neuro: Denies headache, numbness, weakness, slurred speech, loss of memory or consciousness Derm: Denies rash, dry skin, scaling or peeling skin change HEENT: Denies blurred vision, double vision, neck stiffness, dysphagia PULM: Denies shortness of breath, cough, sputum production, hemoptysis, wheezing CV: Denies chest pain, edema, orthopnea, palpitations GI: Denies abdominal pain, nausea, vomiting, diarrhea, hematochezia, melena, constipation  Endocrine: Denies hot or cold intolerance, polyuria, polyphagia or appetite change Heme: Denies easy bruising, bleeding, bleeding gums  PE:  Blood pressure 127/75, pulse 97, temperature 98.2 F (36.8 C), temperature source Oral, resp. rate 18, height 5' (1.524 m), weight 47 kg, SpO2 100%. General: WDWN patient in NAD. Psych:  Appropriate mood and affect. Neuro:  A&O x 3, Moving all extremities, sensation intact to light touch HEENT:  EOMs  intact Chest:  Even non-labored respirations Skin:  C/D/I, no rashes or lesions Extremities: warm/dry, mild edema to R hip, no erythema or echymosis.  No lymphadenopathy. Pulses: Popliteus 2+ MSK:  ROM: TKE, MMT: able to perform quad set. Tender to palpation R anterior hip and laterally at greater trochanter.   Assessment/Plan: R femoral neck fx  I explained the severity of this injury to the patient.  This is a painful and unstable injury.  Hopeful to the OR today for definitive treatment.  Continue with NPO status and hold blood thinners.  Alfredo Martinez PA-C [facility] Office:  [phone]   Addendum:  I have seen and examined the patient and agree with findings documented above.  Surgery scheduled for tomorrow.  NPO after midnight.  Please hold blood thinners.  I updated the patient on the treatment plan and timing of surgery.

## 2023-03-04 NOTE — Progress Notes (Signed)
Initial Nutrition Assessment  INTERVENTION:   -Ensure Plus High Protein po BID, each supplement provides 350 kcal and 20 grams of protein.   -Multivitamin with minerals daily  NUTRITION DIAGNOSIS:   Increased nutrient needs related to hip fracture as evidenced by estimated needs.  GOAL:   Patient will meet greater than or equal to 90% of their needs  MONITOR:   PO intake, Supplement acceptance, Labs, I & O's, Weight trends  REASON FOR ASSESSMENT:   Consult Hip fracture protocol  ASSESSMENT:   89 year old female, with past medical history significant for mitral valve repair, previous stroke, paroxysmal A-fib for which she is on Xarelto, hypothyroidism, and stage III chronic kidney disease, presented to [facility] emergency department yesterday after sustaining a fall at home.  Patient in room, receiving patient care. Will attempt to speak with patient at a later time.  Per chart review, pt was NPO this morning but now on a diet as surgery for her hip will be tomorrow. On regular diet, and consuming 100% of meals. Will go ahead and order Ensure and daily MVI to maximize nutrition given surgery plans.  Per weight records, weights have remained stable since 2023.  Medications reviewed.  Labs reviewed.  NUTRITION - FOCUSED PHYSICAL EXAM:  Unable to complete at this time  Diet Order:   Diet Order             Diet NPO time specified  Diet effective midnight           Diet regular Room service appropriate? Yes; Fluid consistency: Thin  Diet effective now                   EDUCATION NEEDS:   Not appropriate for education at this time  Skin:  Skin Assessment: Reviewed RN Assessment  Last BM:  12/17  Height:   Ht Readings from Last 1 Encounters:  03/04/23 5' (1.524 m)    Weight:   Wt Readings from Last 1 Encounters:  03/04/23 47 kg    BMI:  Body mass index is 20.24 kg/m.  Estimated Nutritional Needs:   Kcal:  1450-1650  Protein:   70-85g  Fluid:  1.6L/day   Tilda Franco, MS, RD, LDN Inpatient Clinical Dietitian Contact via Secure chat

## 2023-03-05 ENCOUNTER — Encounter (HOSPITAL_COMMUNITY): Payer: Self-pay | Admitting: Internal Medicine

## 2023-03-05 ENCOUNTER — Inpatient Hospital Stay (HOSPITAL_COMMUNITY): Payer: Self-pay | Admitting: Anesthesiology

## 2023-03-05 ENCOUNTER — Other Ambulatory Visit: Payer: Self-pay

## 2023-03-05 ENCOUNTER — Encounter (HOSPITAL_COMMUNITY)
Admission: EM | Disposition: A | Discharge status: Skilled Nursing Facility | Payer: Self-pay | Source: Home / Self Care | Attending: Student

## 2023-03-05 ENCOUNTER — Inpatient Hospital Stay (HOSPITAL_COMMUNITY): Payer: Medicare Other

## 2023-03-05 DIAGNOSIS — Z87891 Personal history of nicotine dependence: Secondary | ICD-10-CM

## 2023-03-05 DIAGNOSIS — Z9889 Other specified postprocedural states: Secondary | ICD-10-CM

## 2023-03-05 DIAGNOSIS — I480 Paroxysmal atrial fibrillation: Secondary | ICD-10-CM | POA: Diagnosis not present

## 2023-03-05 DIAGNOSIS — Z8673 Personal history of transient ischemic attack (TIA), and cerebral infarction without residual deficits: Secondary | ICD-10-CM

## 2023-03-05 DIAGNOSIS — S72001A Fracture of unspecified part of neck of right femur, initial encounter for closed fracture: Secondary | ICD-10-CM | POA: Diagnosis not present

## 2023-03-05 DIAGNOSIS — I10 Essential (primary) hypertension: Secondary | ICD-10-CM

## 2023-03-05 DIAGNOSIS — N183 Chronic kidney disease, stage 3 unspecified: Secondary | ICD-10-CM

## 2023-03-05 DIAGNOSIS — M8580 Other specified disorders of bone density and structure, unspecified site: Secondary | ICD-10-CM

## 2023-03-05 DIAGNOSIS — F32A Depression, unspecified: Secondary | ICD-10-CM

## 2023-03-05 DIAGNOSIS — S72001D Fracture of unspecified part of neck of right femur, subsequent encounter for closed fracture with routine healing: Secondary | ICD-10-CM | POA: Diagnosis not present

## 2023-03-05 DIAGNOSIS — E034 Atrophy of thyroid (acquired): Secondary | ICD-10-CM | POA: Diagnosis not present

## 2023-03-05 DIAGNOSIS — I129 Hypertensive chronic kidney disease with stage 1 through stage 4 chronic kidney disease, or unspecified chronic kidney disease: Secondary | ICD-10-CM | POA: Diagnosis not present

## 2023-03-05 HISTORY — PX: ANTERIOR APPROACH HEMI HIP ARTHROPLASTY: SHX6690

## 2023-03-05 LAB — RENAL FUNCTION PANEL
Albumin: 3 g/dL — ABNORMAL LOW (ref 3.5–5.0)
Anion gap: 7 (ref 5–15)
BUN: 22 mg/dL (ref 8–23)
CO2: 27 mmol/L (ref 22–32)
Calcium: 8.2 mg/dL — ABNORMAL LOW (ref 8.9–10.3)
Chloride: 102 mmol/L (ref 98–111)
Creatinine, Ser: 0.6 mg/dL (ref 0.44–1.00)
GFR, Estimated: >60 mL/min (ref >60)
Glucose, Bld: 133 mg/dL — ABNORMAL HIGH (ref 70–99)
Phosphorus: 1.9 mg/dL — ABNORMAL LOW (ref 2.5–4.6)
Potassium: 4 mmol/L (ref 3.5–5.1)
Sodium: 136 mmol/L (ref 135–145)

## 2023-03-05 LAB — MAGNESIUM: Magnesium: 2.1 mg/dL (ref 1.7–2.4)

## 2023-03-05 LAB — CBC
HCT: 40.1 % (ref 36.0–46.0)
Hemoglobin: 13.1 g/dL (ref 12.0–15.0)
MCH: 32.8 pg (ref 26.0–34.0)
MCHC: 32.7 g/dL (ref 30.0–36.0)
MCV: 100.5 fL — ABNORMAL HIGH (ref 80.0–100.0)
Platelets: 151 10*3/uL (ref 150–400)
RBC: 3.99 MIL/uL (ref 3.87–5.11)
RDW: 13.3 % (ref 11.5–15.5)
WBC: 9.2 10*3/uL (ref 4.0–10.5)
nRBC: 0 % (ref 0.0–0.2)

## 2023-03-05 LAB — TSH: TSH: 1.775 u[IU]/mL (ref 0.350–4.500)

## 2023-03-05 LAB — VITAMIN D 25 HYDROXY (VIT D DEFICIENCY, FRACTURES): Vit D, 25-Hydroxy: 55.43 ng/mL (ref 30–100)

## 2023-03-05 LAB — TYPE AND SCREEN
ABO/RH(D): O POS
Antibody Screen: NEGATIVE

## 2023-03-05 SURGERY — HEMIARTHROPLASTY, HIP, DIRECT ANTERIOR APPROACH, FOR FRACTURE
Anesthesia: General | Laterality: Right

## 2023-03-05 MED ORDER — FENTANYL CITRATE (PF) 100 MCG/2ML IJ SOLN
INTRAMUSCULAR | Status: AC
  Filled 2023-03-05: qty 2

## 2023-03-05 MED ORDER — ROCURONIUM BROMIDE 10 MG/ML (PF) SYRINGE
PREFILLED_SYRINGE | INTRAVENOUS | Status: DC | PRN
  Administered 2023-03-05: 20 mg via INTRAVENOUS
  Administered 2023-03-05: 50 mg via INTRAVENOUS

## 2023-03-05 MED ORDER — ONDANSETRON HCL 4 MG/2ML IJ SOLN
INTRAMUSCULAR | Status: AC
Start: 2023-03-05 — End: ?
  Filled 2023-03-05: qty 2

## 2023-03-05 MED ORDER — DOCUSATE SODIUM 100 MG PO CAPS
100.0000 mg | ORAL_CAPSULE | Freq: Two times a day (BID) | ORAL | Status: DC
  Administered 2023-03-05 – 2023-03-09 (×8): 100 mg via ORAL
  Filled 2023-03-05 (×8): qty 1

## 2023-03-05 MED ORDER — SODIUM CHLORIDE 0.9 % IR SOLN
Status: DC | PRN
  Administered 2023-03-05: 3000 mL

## 2023-03-05 MED ORDER — PHENOL 1.4 % MT LIQD: 1.0000 | OROMUCOSAL | Status: DC | PRN

## 2023-03-05 MED ORDER — LIDOCAINE HCL (PF) 2 % IJ SOLN
INTRAMUSCULAR | Status: AC
Start: 2023-03-05 — End: ?
  Filled 2023-03-05: qty 5

## 2023-03-05 MED ORDER — PHENYLEPHRINE 80 MCG/ML (10ML) SYRINGE FOR IV PUSH (FOR BLOOD PRESSURE SUPPORT)
PREFILLED_SYRINGE | INTRAVENOUS | Status: AC
  Filled 2023-03-05: qty 10

## 2023-03-05 MED ORDER — SUGAMMADEX SODIUM 200 MG/2ML IV SOLN
INTRAVENOUS | Status: DC | PRN
  Administered 2023-03-05: 100 mg via INTRAVENOUS

## 2023-03-05 MED ORDER — PHENYLEPHRINE 80 MCG/ML (10ML) SYRINGE FOR IV PUSH (FOR BLOOD PRESSURE SUPPORT)
PREFILLED_SYRINGE | INTRAVENOUS | Status: DC | PRN
  Administered 2023-03-05: 160 ug via INTRAVENOUS

## 2023-03-05 MED ORDER — OXYCODONE HCL 5 MG/5ML PO SOLN: 5.0000 mg | Freq: Once | ORAL | Status: DC | PRN

## 2023-03-05 MED ORDER — KETOROLAC TROMETHAMINE 30 MG/ML IJ SOLN
INTRAMUSCULAR | Status: AC
  Filled 2023-03-05: qty 1

## 2023-03-05 MED ORDER — SODIUM CHLORIDE (PF) 0.9 % IJ SOLN
INTRAMUSCULAR | Status: AC
  Filled 2023-03-05: qty 30

## 2023-03-05 MED ORDER — ENSURE PRE-SURGERY PO LIQD
296.0000 mL | Freq: Once | ORAL | Status: AC
  Administered 2023-03-05: 296 mL via ORAL
  Filled 2023-03-05: qty 296

## 2023-03-05 MED ORDER — BUPIVACAINE HCL (PF) 0.25 % IJ SOLN
INTRAMUSCULAR | Status: AC
  Filled 2023-03-05: qty 30

## 2023-03-05 MED ORDER — MENTHOL 3 MG MT LOZG: 1.0000 | LOZENGE | OROMUCOSAL | Status: DC | PRN

## 2023-03-05 MED ORDER — METOCLOPRAMIDE HCL 5 MG PO TABS: 5.0000 mg | ORAL_TABLET | Freq: Three times a day (TID) | ORAL | Status: DC | PRN

## 2023-03-05 MED ORDER — PROPOFOL 10 MG/ML IV BOLUS
INTRAVENOUS | Status: DC | PRN
  Administered 2023-03-05: 20 mg via INTRAVENOUS
  Administered 2023-03-05: 80 mg via INTRAVENOUS

## 2023-03-05 MED ORDER — PHENYLEPHRINE HCL (PRESSORS) 10 MG/ML IV SOLN
INTRAVENOUS | Status: AC
  Filled 2023-03-05: qty 1

## 2023-03-05 MED ORDER — ROCURONIUM BROMIDE 10 MG/ML (PF) SYRINGE
PREFILLED_SYRINGE | INTRAVENOUS | Status: AC
  Filled 2023-03-05: qty 10

## 2023-03-05 MED ORDER — LIDOCAINE 2% (20 MG/ML) 5 ML SYRINGE
INTRAMUSCULAR | Status: DC | PRN
  Administered 2023-03-05: 30 mg via INTRAVENOUS

## 2023-03-05 MED ORDER — TRANEXAMIC ACID-NACL 1000-0.7 MG/100ML-% IV SOLN
1000.0000 mg | INTRAVENOUS | Status: AC
  Administered 2023-03-05: 1000 mg via INTRAVENOUS
  Filled 2023-03-05: qty 100

## 2023-03-05 MED ORDER — AMISULPRIDE (ANTIEMETIC) 5 MG/2ML IV SOLN: 10.0000 mg | Freq: Once | INTRAVENOUS | Status: DC | PRN

## 2023-03-05 MED ORDER — PROPOFOL 10 MG/ML IV BOLUS
INTRAVENOUS | Status: AC
  Filled 2023-03-05: qty 20

## 2023-03-05 MED ORDER — BUPIVACAINE HCL (PF) 0.25 % IJ SOLN
INTRAMUSCULAR | Status: DC | PRN
  Administered 2023-03-05: 30 mL

## 2023-03-05 MED ORDER — METOCLOPRAMIDE HCL 5 MG/ML IJ SOLN: 5.0000 mg | Freq: Three times a day (TID) | INTRAMUSCULAR | Status: DC | PRN

## 2023-03-05 MED ORDER — ONDANSETRON HCL 4 MG/2ML IJ SOLN
INTRAMUSCULAR | Status: DC | PRN
  Administered 2023-03-05: 4 mg via INTRAVENOUS

## 2023-03-05 MED ORDER — POVIDONE-IODINE 10 % EX SWAB: 2.0000 | Freq: Once | CUTANEOUS | Status: DC

## 2023-03-05 MED ORDER — KETOROLAC TROMETHAMINE 30 MG/ML IJ SOLN
INTRAMUSCULAR | Status: DC | PRN
  Administered 2023-03-05: 30 mg via INTRA_ARTICULAR

## 2023-03-05 MED ORDER — FENTANYL CITRATE (PF) 250 MCG/5ML IJ SOLN
INTRAMUSCULAR | Status: DC | PRN
  Administered 2023-03-05 (×3): 50 ug via INTRAVENOUS

## 2023-03-05 MED ORDER — POLYETHYLENE GLYCOL 3350 17 G PO PACK: 17.0000 g | PACK | Freq: Every day | ORAL | Status: DC | PRN

## 2023-03-05 MED ORDER — CEFAZOLIN SODIUM-DEXTROSE 2-4 GM/100ML-% IV SOLN
2.0000 g | Freq: Four times a day (QID) | INTRAVENOUS | Status: AC
  Administered 2023-03-06 (×2): 2 g via INTRAVENOUS
  Filled 2023-03-05 (×2): qty 100

## 2023-03-05 MED ORDER — LACTATED RINGERS IV SOLN
INTRAVENOUS | Status: DC
Start: 2023-03-05 — End: 2023-03-05

## 2023-03-05 MED ORDER — SODIUM CHLORIDE (PF) 0.9 % IJ SOLN
INTRAMUSCULAR | Status: DC | PRN
  Administered 2023-03-05: 30 mL

## 2023-03-05 MED ORDER — CHLORHEXIDINE GLUCONATE 4 % EX SOLN
60.0000 mL | Freq: Once | CUTANEOUS | Status: DC
Start: 2023-03-05 — End: 2023-03-05

## 2023-03-05 MED ORDER — PHENYLEPHRINE HCL-NACL 20-0.9 MG/250ML-% IV SOLN
INTRAVENOUS | Status: DC | PRN
  Administered 2023-03-05: 50 ug/min via INTRAVENOUS

## 2023-03-05 MED ORDER — 0.9 % SODIUM CHLORIDE (POUR BTL) OPTIME
TOPICAL | Status: DC | PRN
  Administered 2023-03-05: 1000 mL

## 2023-03-05 MED ORDER — CHLORHEXIDINE GLUCONATE 0.12 % MT SOLN
15.0000 mL | Freq: Once | OROMUCOSAL | Status: AC
  Administered 2023-03-05: 15 mL via OROMUCOSAL

## 2023-03-05 MED ORDER — SENNA 8.6 MG PO TABS
1.0000 | ORAL_TABLET | Freq: Two times a day (BID) | ORAL | Status: DC
  Administered 2023-03-05 – 2023-03-09 (×7): 8.6 mg via ORAL
  Filled 2023-03-05 (×7): qty 1

## 2023-03-05 MED ORDER — EPINEPHRINE PF 1 MG/ML IJ SOLN
INTRAMUSCULAR | Status: AC
  Filled 2023-03-05: qty 1

## 2023-03-05 MED ORDER — SODIUM CHLORIDE 0.9 % IV SOLN
INTRAVENOUS | Status: AC | PRN
  Administered 2023-03-05: 50 mL

## 2023-03-05 MED ORDER — ONDANSETRON HCL 4 MG PO TABS: 4.0000 mg | ORAL_TABLET | Freq: Four times a day (QID) | ORAL | Status: DC | PRN

## 2023-03-05 MED ORDER — ORAL CARE MOUTH RINSE: 15.0000 mL | Freq: Once | OROMUCOSAL | Status: AC

## 2023-03-05 MED ORDER — DEXAMETHASONE SODIUM PHOSPHATE 10 MG/ML IJ SOLN
INTRAMUSCULAR | Status: DC | PRN
  Administered 2023-03-05: 5 mg via INTRAVENOUS

## 2023-03-05 MED ORDER — OXYCODONE HCL 5 MG PO TABS: 5.0000 mg | ORAL_TABLET | Freq: Once | ORAL | Status: DC | PRN

## 2023-03-05 MED ORDER — HYDROMORPHONE HCL 1 MG/ML IJ SOLN: 0.2500 mg | INTRAMUSCULAR | Status: DC | PRN

## 2023-03-05 MED ORDER — ONDANSETRON HCL 4 MG/2ML IJ SOLN: 4.0000 mg | Freq: Four times a day (QID) | INTRAMUSCULAR | Status: DC | PRN

## 2023-03-05 MED ORDER — DEXAMETHASONE SODIUM PHOSPHATE 10 MG/ML IJ SOLN
INTRAMUSCULAR | Status: AC
  Filled 2023-03-05: qty 1

## 2023-03-05 MED ORDER — HYDROMORPHONE HCL 1 MG/ML IJ SOLN
INTRAMUSCULAR | Status: AC
  Filled 2023-03-05: qty 1

## 2023-03-05 MED ORDER — CEFAZOLIN SODIUM-DEXTROSE 2-4 GM/100ML-% IV SOLN
2.0000 g | INTRAVENOUS | Status: AC
  Administered 2023-03-05: 2 g via INTRAVENOUS
  Filled 2023-03-05: qty 100

## 2023-03-05 MED ORDER — CHLORHEXIDINE GLUCONATE 0.12 % MT SOLN
15.0000 mL | Freq: Once | OROMUCOSAL | Status: DC
  Filled 2023-03-05: qty 15

## 2023-03-05 MED ORDER — SODIUM CHLORIDE 0.9 % IV SOLN: 12.5000 mg | INTRAVENOUS | Status: DC | PRN

## 2023-03-05 SURGICAL SUPPLY — 44 items
BAG COUNTER SPONGE SURGICOUNT (BAG) ×1 IMPLANT
BLADE CLIPPER SURG (BLADE) IMPLANT
CHLORAPREP W/TINT 26 (MISCELLANEOUS) ×1 IMPLANT
COVER SURGICAL LIGHT HANDLE (MISCELLANEOUS) ×1 IMPLANT
CUP ARTIC RINGLOC HIP 28X44 (Orthopedic Implant) IMPLANT
DERMABOND ADVANCED .7 DNX12 (GAUZE/BANDAGES/DRESSINGS) ×2 IMPLANT
DRAPE IMP U-DRAPE 54X76 (DRAPES) ×1 IMPLANT
DRAPE SHEET LG 3/4 BI-LAMINATE (DRAPES) ×2 IMPLANT
DRAPE STERI IOBAN 125X83 (DRAPES) ×1 IMPLANT
DRAPE U-SHAPE 47X51 STRL (DRAPES) ×2 IMPLANT
DRSG AQUACEL AG ADV 3.5X10 (GAUZE/BANDAGES/DRESSINGS) ×1 IMPLANT
ELECT REM PT RETURN 15FT ADLT (MISCELLANEOUS) ×1 IMPLANT
EVACUATOR 1/8 PVC DRAIN (DRAIN) IMPLANT
GLOVE BIO SURGEON STRL SZ7 (GLOVE) ×1 IMPLANT
GLOVE BIO SURGEON STRL SZ8.5 (GLOVE) ×2 IMPLANT
GLOVE BIOGEL PI IND STRL 7.5 (GLOVE) ×1 IMPLANT
GLOVE BIOGEL PI IND STRL 8.5 (GLOVE) ×1 IMPLANT
GOWN STRL REUS W/ TWL XL LVL3 (GOWN DISPOSABLE) ×1 IMPLANT
GOWN STRL REUS W/TWL 2XL LVL3 (GOWN DISPOSABLE) ×1 IMPLANT
HEAD MOD COCR 28MM HD -3MM NK (Orthopedic Implant) IMPLANT
HOOD PEEL AWAY T7 (MISCELLANEOUS) ×3 IMPLANT
KIT TURNOVER KIT A (KITS) IMPLANT
MANIFOLD NEPTUNE II (INSTRUMENTS) ×1 IMPLANT
MARKER SKIN DUAL TIP RULER LAB (MISCELLANEOUS) ×1 IMPLANT
NDL SPNL 18GX3.5 QUINCKE PK (NEEDLE) ×1 IMPLANT
NEEDLE SPNL 18GX3.5 QUINCKE PK (NEEDLE) ×1 IMPLANT
NS IRRIG 1000ML POUR BTL (IV SOLUTION) ×1 IMPLANT
PACK ANTERIOR HIP CUSTOM (KITS) ×1 IMPLANT
PENCIL SMOKE EVACUATOR (MISCELLANEOUS) IMPLANT
SAW OSC TIP CART 19.5X105X1.3 (SAW) ×1 IMPLANT
SEALER BIPOLAR AQUA 6.0 (INSTRUMENTS) ×1 IMPLANT
SET HNDPC FAN SPRY TIP SCT (DISPOSABLE) ×1 IMPLANT
SOLUTION PRONTOSAN WOUND 350ML (IRRIGATION / IRRIGATOR) IMPLANT
STEM FEM CMTLS 16X152 133D (Stem) IMPLANT
SUT MNCRL AB 3-0 PS2 18 (SUTURE) ×1 IMPLANT
SUT MON AB 2-0 CT1 36 (SUTURE) ×1 IMPLANT
SUT STRATAFIX 14 PDO 48 VLT (SUTURE) ×1 IMPLANT
SUT STRATAFIX PDO 1 14 VIOLET (SUTURE) ×1
SUT VIC AB 1 CT1 27XBRD ANTBC (SUTURE) ×1 IMPLANT
SUT VIC AB 2-0 CT1 TAPERPNT 27 (SUTURE) ×1 IMPLANT
TOWEL GREEN STERILE FF (TOWEL DISPOSABLE) ×1 IMPLANT
TRAY FOLEY MTR SLVR 16FR STAT (SET/KITS/TRAYS/PACK) IMPLANT
TUBE SUCTION HIGH CAP CLEAR NV (SUCTIONS) ×1 IMPLANT
WATER STERILE IRR 1000ML POUR (IV SOLUTION) ×2 IMPLANT

## 2023-03-05 NOTE — Anesthesia Preprocedure Evaluation (Signed)
Anesthesia Evaluation  Patient identified by MRN, date of birth, ID band Patient awake    Reviewed: Allergy & Precautions, H&P , NPO status , Patient's Chart, lab work & pertinent test results  Airway Mallampati: II  TM Distance: >3 FB Neck ROM: Full    Dental no notable dental hx.    Pulmonary former smoker   Pulmonary exam normal breath sounds clear to auscultation       Cardiovascular hypertension, Normal cardiovascular exam+ dysrhythmias Atrial Fibrillation + Valvular Problems/Murmurs MVP  Rhythm:Irregular Rate:Normal     Neuro/Psych    Depression    CVA    GI/Hepatic ,GERD  ,,  Endo/Other  Hypothyroidism    Renal/GU Renal InsufficiencyRenal disease     Musculoskeletal  (+) Arthritis , Osteoarthritis,    Abdominal   Peds  Hematology   Anesthesia Other Findings   Reproductive/Obstetrics                             Anesthesia Physical Anesthesia Plan  ASA: III  Anesthesia Plan: General   Post-op Pain Management:    Induction: Intravenous  PONV Risk Score and Plan: 3 and Ondansetron, Dexamethasone, Midazolam and Treatment may vary due to age or medical condition  Airway Management Planned: Oral ETT  Additional Equipment:   Intra-op Plan:   Post-operative Plan: Extubation in OR  Informed Consent: I have reviewed the patients History and Physical, chart, labs and discussed the procedure including the risks, benefits and alternatives for the proposed anesthesia with the patient or authorized representative who has indicated his/her understanding and acceptance.       Plan Discussed with: CRNA and Surgeon  Anesthesia Plan Comments:         Anesthesia Quick Evaluation

## 2023-03-05 NOTE — Plan of Care (Signed)
  Problem: Education: Goal: Knowledge of General Education information will improve Description: Including pain rating scale, medication(s)/side effects and non-pharmacologic comfort measures Outcome: Progressing   Problem: Activity: Goal: Risk for activity intolerance will decrease Outcome: Progressing   Problem: Pain Management: Goal: General experience of comfort will improve Outcome: Progressing

## 2023-03-05 NOTE — Transfer of Care (Signed)
Immediate Anesthesia Transfer of Care Note  Patient: Theresa [name]  Procedure(s) Performed: ANTERIOR APPROACH HEMI HIP ARTHROPLASTY (Right)  Patient Location: PACU  Anesthesia Type:General  Level of Consciousness: awake and drowsy  Airway & Oxygen Therapy: Patient Spontanous Breathing and Patient connected to face mask oxygen  Post-op Assessment: Report given to RN and Post -op Vital signs reviewed and stable  Post vital signs: Reviewed and stable  Last Vitals:  Vitals Value Taken Time  BP 151/78 03/05/23 2045  Temp    Pulse 102 03/05/23 2046  Resp 14 03/05/23 2046  SpO2 98 % 03/05/23 2046  Vitals shown include unfiled device data.  Last Pain:  Vitals:   03/05/23 1519  TempSrc: Oral  PainSc: 0-No pain         Complications: No notable events documented.

## 2023-03-05 NOTE — Anesthesia Postprocedure Evaluation (Signed)
Anesthesia Post Note  Patient: Theresa [name]  Procedure(s) Performed: ANTERIOR APPROACH HEMI HIP ARTHROPLASTY (Right)     Patient location during evaluation: PACU Anesthesia Type: General Level of consciousness: awake and alert Pain management: pain level controlled Vital Signs Assessment: post-procedure vital signs reviewed and stable Respiratory status: spontaneous breathing, nonlabored ventilation and respiratory function stable Cardiovascular status: blood pressure returned to baseline and stable Postop Assessment: no apparent nausea or vomiting Anesthetic complications: no   No notable events documented.  Last Vitals:  Vitals:   03/05/23 2045 03/05/23 2100  BP: (!) 151/78 123/61  Pulse: 83 (!) 142  Resp: 14 (!) 22  Temp:    SpO2: 98% (!) 89%    Last Pain:  Vitals:   03/05/23 1519  TempSrc: Oral  PainSc: 0-No pain                 Lowella Curb

## 2023-03-05 NOTE — Progress Notes (Signed)
1610 New IV site left forearm #18

## 2023-03-05 NOTE — Op Note (Signed)
OPERATIVE REPORT  SURGEON: Samson Frederic, MD   ASSISTANT: Clint Bolder, PA-C  PREOPERATIVE DIAGNOSIS: Displaced Right femoral neck fracture.   POSTOPERATIVE DIAGNOSIS: Displaced Right femoral neck fracture.   PROCEDURE: Right hip hemiarthroplasty, anterior approach.   IMPLANTS: Biomet Taperloc Complete Reduced Distal stem, size 16 x 152 mm, high offset, with a 28 - 3 mm metal head ball and a 44 mm bipolar articulation.  ANESTHESIA:  General  ANTIBIOTICS: 2g ancef.  ESTIMATED BLOOD LOSS:-100 mL    DRAINS: None.  COMPLICATIONS: None   CONDITION: PACU - hemodynamically stable.   BRIEF CLINICAL NOTE: Theresa [name] is a 89 y.o. female with a displaced Right femoral neck fracture. The patient was admitted to the hospitalist service and underwent perioperative risk stratification and medical optimization. The risks, benefits, and alternatives to hemiarthroplasty were explained, and the patient elected to proceed.  PROCEDURE IN DETAIL: The patient was taken to the operating room and general anesthesia was induced on the hospital bed.  The patient was then positioned on the Hana table.  All bony prominences were well padded.  The hip was prepped and draped in the normal sterile surgical fashion.  A time-out was called verifying side and site of surgery. Antibiotics were given within 60 minutes of beginning the procedure.   Bikini incision was made, and the direct anterior approach to the hip was performed through the Hueter interval.  Superficial dissection was carried out lateral to the ASIS. Lateral femoral circumflex vessels were treated with the Auqumantys. The anterior capsule was exposed and an inverted T capsulotomy was made. Fracture hematoma was encountered and evacuated. The patient was found to have a comminuted Right subcapital femoral neck fracture.  Inferior pubofemoral ligament was released subperiosteally to the lesser trochanter. I freshened the femoral neck cut with a saw.   I removed the femoral neck fragment.  A corkscrew was placed into the head and the head was removed.  This was passed to the back table and was measured.   Acetabular exposure was achieved.  I examined the articular cartilage which was intact.  The labrum was intact. A 44 mm trial head was placed and found to have excellent fit.   I then gained femoral exposure taking care to protect the abductors and greater trochanter.  The superior capsule was incised longitudinally, staying lateral to the posterior border of the femoral neck. External rotation, extension, and adduction were applied.  A cookie cutter was used to enter the femoral canal, and then the femoral canal finder was used to confirm location.  I then sequentially broached up to a size 16.  Calcar planer was used on the femoral neck remnant.  I placed a high offset neck and a bipolar trial head ball. The hip was reduced.  Leg lengths were checked fluoroscopically.  The hip was dislocated and trial components were removed.  I placed the real stem followed by the real bipolar construct.  A single reduction maneuver was performed and the hip was reduced.  Fluoroscopy was used to confirm component position and leg lengths.  At 90 degrees of external rotation and extension, the hip was stable to an anterior directed force.   The wound was copiously irrigated with Irrisept solution and normal saline using pulse lavage.  Marcaine solution was injected into the periarticular soft tissue.  The wound was closed in layers using #1 Stratafix for the fascia, 2-0 Vicryl for the subcutaneous fat, 2-0 Monocryl for the deep dermal layer, and staples + Dermabond for  the skin.  Once the glue was fully dried, an Aquacell Ag dressing was applied.  The patient was then awakened from anesthesia and transported to the recovery room in stable condition.  Sponge, needle, and instrument counts were correct at the end of the case x2.  The patient tolerated the procedure well and  there were no known complications.  Please note that a surgical assistant was a medical necessity for this procedure to perform it in a safe and expeditious manner. Assistant was necessary to provide appropriate retraction of vital neurovascular structures, to prevent femoral fracture, and to allow for anatomic placement of the prosthesis.

## 2023-03-05 NOTE — Plan of Care (Signed)

## 2023-03-05 NOTE — Anesthesia Procedure Notes (Signed)
Procedure Name: Intubation Date/Time: 03/05/2023 7:03 PM  Performed by: Elyn Peers, CRNAPre-anesthesia Checklist: Patient identified, Emergency Drugs available, Suction available, Patient being monitored and Timeout performed Patient Re-evaluated:Patient Re-evaluated prior to induction Oxygen Delivery Method: Circle system utilized Preoxygenation: Pre-oxygenation with 100% oxygen Induction Type: IV induction Ventilation: Mask ventilation without difficulty Laryngoscope Size: Miller and 2 Grade View: Grade I Tube type: Oral Tube size: 6.5 mm Number of attempts: 1 Airway Equipment and Method: Stylet Placement Confirmation: ETT inserted through vocal cords under direct vision, positive ETCO2 and breath sounds checked- equal and bilateral Secured at: 22 cm Tube secured with: Tape Dental Injury: Teeth and Oropharynx as per pre-operative assessment

## 2023-03-05 NOTE — Interval H&P Note (Signed)
History and Physical Interval Note:  03/05/2023 6:46 PM  Theresa [name]  has presented today for surgery, with the diagnosis of RIGHT FEMORAL NECK FRACTURE.  The various methods of treatment have been discussed with the patient and family. After consideration of risks, benefits and other options for treatment, the patient has consented to  Procedure(s): ANTERIOR APPROACH HEMI HIP ARTHROPLASTY (Right) as a surgical intervention.  The patient's history has been reviewed, patient examined, no change in status, stable for surgery.  I have reviewed the patient's chart and labs.  Questions were answered to the patient's satisfaction.    The risks, benefits, and alternatives were discussed with the patient. There are risks associated with the surgery including, but not limited to, problems with anesthesia (death), infection, instability (giving out of the joint), dislocation, differences in leg length/angulation/rotation, fracture of bones, loosening or failure of implants, hematoma (blood accumulation) which may require surgical drainage, blood clots, pulmonary embolism, nerve injury (foot drop and lateral thigh numbness), and blood vessel injury. The patient understands these risks and elects to proceed.    Iline Oven Aleicia Kenagy

## 2023-03-05 NOTE — Progress Notes (Signed)
1550 T&S ordered per Dr. Anitra Lauth, ABO on record from [facility].

## 2023-03-05 NOTE — Discharge Instructions (Signed)
Dr. Rod Can Joint Replacement Specialist [facility] [address], [place], [place] [address] [phone]   HIP REPLACEMENT POSTOPERATIVE DIRECTIONS    Hip Rehabilitation, Guidelines Following Surgery   WEIGHT BEARING Weight bearing as tolerated with assist device (walker, cane, etc) as directed, use it as long as suggested by your surgeon or therapist, typically at least 4-6 weeks.  The results of a hip operation are greatly improved after range of motion and muscle strengthening exercises. Follow all safety measures which are given to protect your hip. If any of these exercises cause increased pain or swelling in your joint, decrease the amount until you are comfortable again. Then slowly increase the exercises. Call your caregiver if you have problems or questions.   HOME CARE INSTRUCTIONS  Most of the following instructions are designed to prevent the dislocation of your new hip.  Remove items at home which could result in a fall. This includes throw rugs or furniture in walking pathways.  Continue medications as instructed at time of discharge. You may have some home medications which will be placed on hold until you complete the course of blood thinner medication. You may start showering once you are discharged home. Do not remove your dressing. Do not put on socks or shoes without following the instructions of your caregivers.   Sit on chairs with arms. Use the chair arms to help push yourself up when arising.  Arrange for the use of a toilet seat elevator so you are not sitting low.  Walk with walker as instructed.  You may resume a sexual relationship in one month or when given the OK by your caregiver.  Use walker as long as suggested by your caregivers.  You may put full weight on your legs and walk as much as is comfortable. Avoid periods of inactivity such as sitting longer than an hour when not asleep. This helps prevent blood clots.   You may return to work once you are cleared by Engineer, production.  Do not drive a car for 6 weeks or until released by your surgeon.  Do not drive while taking narcotics.  Wear elastic stockings for two weeks following surgery during the day but you may remove then at night.  Make sure you keep all of your appointments after your operation with all of your doctors and caregivers. You should call the office at the above phone number and make an appointment for approximately two weeks after the date of your surgery. Please pick up a stool softener and laxative for home use as long as you are requiring pain medications. ICE to the affected hip every three hours for 30 minutes at a time and then as needed for pain and swelling. Continue to use ice on the hip for pain and swelling from surgery. You may notice swelling that will progress down to the foot and ankle.  This is normal after surgery.  Elevate the leg when you are not up walking on it.   It is important for you to complete the blood thinner medication as prescribed by your doctor. Continue to use the breathing machine which will help keep your temperature down.  It is common for your temperature to cycle up and down following surgery, especially at night when you are not up moving around and exerting yourself.  The breathing machine keeps your lungs expanded and your temperature down.  RANGE OF MOTION AND STRENGTHENING EXERCISES  These exercises are designed to help you keep  full movement of your hip joint. Follow your caregiver's or physical therapist's instructions. Perform all exercises about fifteen times, three times per day or as directed. Exercise both hips, even if you have had only one joint replacement. These exercises can be done on a training (exercise) mat, on the floor, on a table or on a bed. Use whatever works the best and is most comfortable for you. Use music or television while you are exercising so that the exercises are a pleasant  break in your day. This will make your life better with the exercises acting as a break in routine you can look forward to.  Lying on your back, slowly slide your foot toward your buttocks, raising your knee up off the floor. Then slowly slide your foot back down until your leg is straight again.  Lying on your back spread your legs as far apart as you can without causing discomfort.  Lying on your side, raise your upper leg and foot straight up from the floor as far as is comfortable. Slowly lower the leg and repeat.  Lying on your back, tighten up the muscle in the front of your thigh (quadriceps muscles). You can do this by keeping your leg straight and trying to raise your heel off the floor. This helps strengthen the largest muscle supporting your knee.  Lying on your back, tighten up the muscles of your buttocks both with the legs straight and with the knee bent at a comfortable angle while keeping your heel on the floor.   SKILLED REHAB INSTRUCTIONS: If the patient is transferred to a skilled rehab facility following release from the hospital, a list of the current medications will be sent to the facility for the patient to continue.  When discharged from the skilled rehab facility, please have the facility set up the patient's [place] prior to being released. Also, the skilled facility will be responsible for providing the patient with their medications at time of release from the facility to include their pain medication and their blood thinner medication. If the patient is still at the rehab facility at time of the two week follow up appointment, the skilled rehab facility will also need to assist the patient in arranging follow up appointment in our office and any transportation needs.  POST-OPERATIVE OPIOID TAPER INSTRUCTIONS: It is important to wean off of your opioid medication as soon as possible. If you do not need pain medication after your surgery it is ok to stop  day one. Opioids include: Codeine, Hydrocodone(Norco, Vicodin), Oxycodone(Percocet, oxycontin) and hydromorphone amongst others.  Long term and even short term use of opiods can cause: Increased pain response Dependence Constipation Depression Respiratory depression And more.  Withdrawal symptoms can include Flu like symptoms Nausea, vomiting And more Techniques to manage these symptoms Hydrate well Eat regular healthy meals Stay active Use relaxation techniques(deep breathing, meditating, yoga) Do Not substitute Alcohol to help with tapering If you have been on opioids for less than two weeks and do not have pain than it is ok to stop all together.  Plan to wean off of opioids This plan should start within one week post op of your joint replacement. Maintain the same interval or time between taking each dose and first decrease the dose.  Cut the total daily intake of opioids by one tablet each day Next start to increase the time between doses. The last dose that should be eliminated is the evening dose.    MAKE SURE  YOU:  Understand these instructions.  Will watch your condition.  Will get help right away if you are not doing well or get worse.  Pick up stool softner and laxative for home use following surgery while on pain medications. Do not remove your dressing. The dressing is waterproof--it is OK to take showers. Continue to use ice for pain and swelling after surgery. Do not use any lotions or creams on the incision until instructed by your surgeon. Total Hip Protocol.

## 2023-03-05 NOTE — Progress Notes (Signed)
PROGRESS NOTE  Theresa [name] YQM:[id number] DOB: 1931/04/23   PCP: Shelva Majestic, MD  [facility]  Uses rolling walker and cane at baseline.  DOA: 03/03/2023 LOS: 2  Chief complaints Chief Complaint  Patient presents with   Hip Pain     Brief Narrative / Interim history: 89 year old F with PMH of left MCA CVA, A-fib off Xarelto for 2 weeks, MVR, HTN, impaired fusion from macular degeneration and hypothyroidism presented with right hip pain after she stumbled on a stool and fell on her right side, and admitted with acute right femoral neck fracture with mild impaction.  No prodromes, head trauma or LOC.  Orthopedic surgery consulted and plan for surgical repair on 12/19.    Subjective: Seen and examined earlier this morning.  No major events overnight of this morning.  No complaints.  Denies pain as long as she is not moving.  Aware of surgical plan today.  Denies chest pain, dyspnea or palpitation.  Objective: Vitals:   03/04/23 2153 03/05/23 0054 03/05/23 0535 03/05/23 1038  BP: 133/67 (!) 148/76 (!) 154/78 (!) 144/78  Pulse: 62 93 70 96  Resp: 16 17 14 16   Temp: 98.2 F (36.8 C) 98.3 F (36.8 C) (!) 97.5 F (36.4 C) 99.1 F (37.3 C)  TempSrc: Oral  Oral Oral  SpO2: 96% 94% 96% 99%  Weight:      Height:        Examination:  GENERAL: No apparent distress.  Nontoxic. HEENT: MMM.  Vision and hearing grossly intact.  NECK: Supple.  No apparent JVD.  RESP:  No IWOB.  Fair aeration bilaterally. CVS: Irregular rhythm.  Normal rate.  Heart sounds normal.  ABD/GI/GU: BS+. Abd soft, NTND.  MSK/EXT: No apparent deformity.  No edema.  Tenderness over right hip. SKIN: no apparent skin lesion or wound NEURO: Awake, alert and oriented appropriately.  No apparent focal neuro deficit. PSYCH: Calm. Normal affect.   Procedures:  None  Microbiology summarized: MRSA PCR screen nonreactive  Assessment and plan: Accidental fall: Stumbled on a stool and fell on  the right side.  Did not strike head.  No prodrome or LOC. Acute closed right femoral neck fracture due to accidental fall -Independently ambulates using walker and cane at baseline. -Orthopedic surgery on board-plan for surgical repair -Patient stable and optimized from cardiopulmonary standpoint -Pain control -Check vitamin D level   Persistent A-fib: Rate controlled.  She was off Xarelto for 2 weeks -Continue Toprol -Hold anticoagulation -Optimize electrolytes  Macular degeneration: Increased nursing assistance as needed for transfers -Fall precaution -PT/OT after surgery  Osteopenia: Colon suspect osteoporois based on fractre in this admision.  TSH normal.   -Follow vitamin D level   History of mitral valve repair: No acute issues suspected   Hypothyroidism: TSH normal. -Continue levothyroxine  History of ischemic left MCA CVA: Stable -Continue home meds.  Depression: Stable -Continue lexapro  Hypertension -Continue home metoprolol   CKD ruled out.  Increased nutrient needs Body mass index is 20.24 kg/m. Nutrition Problem: Increased nutrient needs Etiology: hip fracture Signs/Symptoms: estimated needs Interventions: Ensure Enlive (each supplement provides 350kcal and 20 grams of protein), MVI   DVT prophylaxis:  SCDs Start: 03/03/23 2224  Code Status: Full code Family Communication: None at bedside Level of care: Med-Surg Status is: Inpatient Remains inpatient appropriate because: Due to right femoral neck fracture   Final disposition: TBD Consultants:  Orthopedic surgery  55 minutes with more than 50% spent in reviewing records, counseling  patient/family and coordinating care.   Sch Meds:  Scheduled Meds:  calcium-vitamin D  1 tablet Oral BID WC   escitalopram  10 mg Oral Daily   feeding supplement  237 mL Oral BID BM   levothyroxine  75 mcg Oral Q0600   metoprolol tartrate  100 mg Oral BID   multivitamin with minerals  1 tablet Oral Daily    rosuvastatin  10 mg Oral Daily   Continuous Infusions:  dextrose 5 % and 0.45 % NaCl with KCl 20 mEq/L 40 mL/hr at 03/04/23 2313   PRN Meds:.HYDROcodone-acetaminophen, methocarbamol **OR** methocarbamol (ROBAXIN) injection, morphine injection, ondansetron (ZOFRAN) IV  Antimicrobials: Anti-infectives (From admission, onward)    None        I have personally reviewed the following labs and images: CBC: Recent Labs  Lab 03/03/23 2124 03/05/23 0748  WBC 10.9* 9.2  NEUTROABS 9.0*  --   HGB 13.4 13.1  HCT 41.0 40.1  MCV 102.2* 100.5*  PLT 150 151   BMP &GFR Recent Labs  Lab 03/03/23 2200 03/05/23 0748  NA 136 136  K 3.5 4.0  CL 103 102  CO2 25 27  GLUCOSE 106* 133*  BUN 34* 22  CREATININE 0.70 0.60  CALCIUM 8.6* 8.2*  MG  --  2.1  PHOS  --  1.9*   Estimated Creatinine Clearance: 34.2 mL/min (by C-G formula based on SCr of 0.6 mg/dL). Liver & Pancreas: Recent Labs  Lab 03/03/23 2200 03/05/23 0748  AST 29  --   ALT 42  --   ALKPHOS 81  --   BILITOT 0.7  --   PROT 5.9*  --   ALBUMIN 3.6 3.0*   No results for input(s): "LIPASE", "AMYLASE" in the last 168 hours. No results for input(s): "AMMONIA" in the last 168 hours. Diabetic: No results for input(s): "HGBA1C" in the last 72 hours. No results for input(s): "GLUCAP" in the last 168 hours. Cardiac Enzymes: No results for input(s): "CKTOTAL", "CKMB", "CKMBINDEX", "TROPONINI" in the last 168 hours. No results for input(s): "PROBNP" in the last 8760 hours. Coagulation Profile: Recent Labs  Lab 03/03/23 2200  INR 1.1   Thyroid Function Tests: Recent Labs    03/05/23 0404  TSH 1.775   Lipid Profile: No results for input(s): "CHOL", "HDL", "LDLCALC", "TRIG", "CHOLHDL", "LDLDIRECT" in the last 72 hours. Anemia Panel: No results for input(s): "VITAMINB12", "FOLATE", "FERRITIN", "TIBC", "IRON", "RETICCTPCT" in the last 72 hours. Urine analysis:    Component Value Date/Time   COLORURINE YELLOW  03/21/2013 1242   APPEARANCEUR CLEAR 03/21/2013 1242   LABSPEC 1.007 03/21/2013 1242   PHURINE 6.5 03/21/2013 1242   GLUCOSEU NEGATIVE 03/21/2013 1242   HGBUR NEGATIVE 03/21/2013 1242   HGBUR negative 12/19/2009 0945   BILIRUBINUR SMALL (A) 03/21/2013 1242   BILIRUBINUR 2+ 01/13/2013 1027   KETONESUR NEGATIVE 03/21/2013 1242   PROTEINUR NEGATIVE 03/21/2013 1242   UROBILINOGEN 0.2 03/21/2013 1242   NITRITE NEGATIVE 03/21/2013 1242   LEUKOCYTESUR NEGATIVE 03/21/2013 1242   Sepsis Labs: Invalid input(s): "PROCALCITONIN", "LACTICIDVEN"  Microbiology: Recent Results (from the past 240 hours)  Surgical pcr screen     Status: None   Collection Time: 03/04/23  9:32 PM   Specimen: Nasal Mucosa; Nasal Swab  Result Value Ref Range Status   MRSA, PCR NEGATIVE NEGATIVE Final   Staphylococcus aureus NEGATIVE NEGATIVE Final    Comment: (NOTE) The Xpert SA Assay (FDA approved for NASAL specimens in patients 6 years of age and older), is one component of  a comprehensive surveillance program. It is not intended to diagnose infection nor to guide or monitor treatment. Performed at [facility], [address] [place], [place] [address]     Radiology Studies: No results found.    Verdie Wilms T. Mayar Whittier [facility]  If 7PM-7AM, please contact night-coverage [email or web address] 03/05/2023, 12:48 PM

## 2023-03-06 ENCOUNTER — Encounter (HOSPITAL_COMMUNITY): Payer: Self-pay | Admitting: Orthopedic Surgery

## 2023-03-06 DIAGNOSIS — I10 Essential (primary) hypertension: Secondary | ICD-10-CM | POA: Diagnosis not present

## 2023-03-06 DIAGNOSIS — S72001D Fracture of unspecified part of neck of right femur, subsequent encounter for closed fracture with routine healing: Secondary | ICD-10-CM | POA: Diagnosis not present

## 2023-03-06 DIAGNOSIS — E034 Atrophy of thyroid (acquired): Secondary | ICD-10-CM | POA: Diagnosis not present

## 2023-03-06 DIAGNOSIS — I480 Paroxysmal atrial fibrillation: Secondary | ICD-10-CM | POA: Diagnosis not present

## 2023-03-06 LAB — RENAL FUNCTION PANEL
Albumin: 3 g/dL — ABNORMAL LOW (ref 3.5–5.0)
Anion gap: 7 (ref 5–15)
BUN: 25 mg/dL — ABNORMAL HIGH (ref 8–23)
CO2: 26 mmol/L (ref 22–32)
Calcium: 7.7 mg/dL — ABNORMAL LOW (ref 8.9–10.3)
Chloride: 97 mmol/L — ABNORMAL LOW (ref 98–111)
Creatinine, Ser: 0.93 mg/dL (ref 0.44–1.00)
GFR, Estimated: 59 mL/min — ABNORMAL LOW (ref >60)
Glucose, Bld: 154 mg/dL — ABNORMAL HIGH (ref 70–99)
Phosphorus: 3.1 mg/dL (ref 2.5–4.6)
Potassium: 4 mmol/L (ref 3.5–5.1)
Sodium: 130 mmol/L — ABNORMAL LOW (ref 135–145)

## 2023-03-06 LAB — CBC
HCT: 39.1 % (ref 36.0–46.0)
Hemoglobin: 12.9 g/dL (ref 12.0–15.0)
MCH: 33.5 pg (ref 26.0–34.0)
MCHC: 33 g/dL (ref 30.0–36.0)
MCV: 101.6 fL — ABNORMAL HIGH (ref 80.0–100.0)
Platelets: 147 10*3/uL — ABNORMAL LOW (ref 150–400)
RBC: 3.85 MIL/uL — ABNORMAL LOW (ref 3.87–5.11)
RDW: 13.2 % (ref 11.5–15.5)
WBC: 10.9 10*3/uL — ABNORMAL HIGH (ref 4.0–10.5)
nRBC: 0 % (ref 0.0–0.2)

## 2023-03-06 LAB — HEMOGLOBIN A1C
Hgb A1c MFr Bld: 5.5 % (ref 4.8–5.6)
Mean Plasma Glucose: 111.15 mg/dL

## 2023-03-06 MED ORDER — RIVAROXABAN 15 MG PO TABS
15.0000 mg | ORAL_TABLET | Freq: Every day | ORAL | Status: DC
  Administered 2023-03-06 – 2023-03-09 (×3): 15 mg via ORAL
  Filled 2023-03-06 (×4): qty 1

## 2023-03-06 MED ORDER — METOPROLOL TARTRATE 50 MG PO TABS
50.0000 mg | ORAL_TABLET | Freq: Two times a day (BID) | ORAL | Status: DC
  Administered 2023-03-06 (×2): 50 mg via ORAL
  Filled 2023-03-06 (×2): qty 1

## 2023-03-06 MED ORDER — HYDROCODONE-ACETAMINOPHEN 5-325 MG PO TABS: 1.0000 | ORAL_TABLET | ORAL | 0 refills | Status: AC | PRN

## 2023-03-06 MED ORDER — SODIUM CHLORIDE 0.9 % IV BOLUS
500.0000 mL | Freq: Once | INTRAVENOUS | Status: AC
  Administered 2023-03-06: 500 mL via INTRAVENOUS

## 2023-03-06 NOTE — [place] FL2 (Signed)
[place] MEDICAID FL2 LEVEL OF CARE FORM     IDENTIFICATION  Patient Name: Theresa [name] Birthdate: 13-Jan-1932 Sex: female Admission Date (Current Location): 03/03/2023  [facility] and [place] Number:  Producer, television/film/video and Address:  [facility],  [address] [place], [place] [address]      Provider Number: [id number]  Attending Physician Name and Address:  Almon Hercules, MD  Relative Name and Phone Number:  Rodena Medin, spouse @ [phone]    Current Level of Care: Hospital Recommended Level of Care: Skilled Nursing Facility Prior Approval Number:    Date Approved/Denied:   PASRR Number: [id number]  Discharge Plan: SNF    Current Diagnoses: Patient Active Problem List   Diagnosis Date Noted   Fracture of femoral neck, right, closed (HCC) 03/03/2023   Paroxysmal atrial fibrillation with RVR (HCC) 03/03/2023   Depression 03/03/2023   Pain due to onychomycosis of toenails of both feet 12/23/2019   Balance disorder 05/03/2018   Macular degeneration 03/31/2017   Difficulty swallowing pills 11/26/2016   Hyperlipidemia 09/01/2014   CKD (chronic kidney disease), stage III (HCC) 03/02/2014   Rosacea 11/23/2013   Major depression in full remission (HCC) 11/23/2013   TIA (transient ischemic attack) 03/21/2013   Hypertension 03/04/2012   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 05/30/2011   Hx of ischemic left MCA stroke 04/08/2011   History of colonic polyps 10/22/2009   CHANGE IN BOWELS 09/20/2009   GASTROESOPHAGEAL REFLUX DISEASE, MILD 06/04/2009   Raynaud's syndrome 03/06/2009   Idiopathic scoliosis and kyphoscoliosis 01/03/2009   Paroxysmal atrial fibrillation (HCC) 07/07/2007   Hypothyroidism 11/23/2006   Osteopenia 11/23/2006   History of mitral valve repair 09/11/2006   Osteoarthritis 09/11/2006    Orientation RESPIRATION BLADDER Height & Weight     Self, Time, Situation, Place  Normal Continent Weight: 103 lb 9.9 oz (47  kg) Height:  5' (152.4 cm)  BEHAVIORAL SYMPTOMS/MOOD NEUROLOGICAL BOWEL NUTRITION STATUS      Continent Diet (regular)  AMBULATORY STATUS COMMUNICATION OF NEEDS Skin   Extensive Assist Verbally Other (Comment) (surgical incision only)                       Personal Care Assistance Level of Assistance  Bathing, Dressing Bathing Assistance: Limited assistance   Dressing Assistance: Limited assistance     Functional Limitations Info  Sight, Hearing, Speech Sight Info: Adequate Hearing Info: Impaired Speech Info: Adequate    SPECIAL CARE FACTORS FREQUENCY  PT (By licensed PT), OT (By licensed OT)     PT Frequency: 5x/wk OT Frequency: 5x/wk            Contractures Contractures Info: Not present    Additional Factors Info  Code Status, Allergies Code Status Info: FUll Allergies Info: Tape, Rofecoxib           Current Medications (03/06/2023):  This is the current hospital active medication list Current Facility-Administered Medications  Medication Dose Route Frequency Provider Last Rate Last Admin   calcium-vitamin D (OSCAL WITH D) 500-5 MG-MCG per tablet 1 tablet  1 tablet Oral BID WC Swinteck, Arlys John, MD   1 tablet at 03/06/23 1100   chlorhexidine (PERIDEX) 0.12 % solution 15 mL  15 mL Mouth/Throat Once Samson Frederic, MD       docusate sodium (COLACE) capsule 100 mg  100 mg Oral BID Samson Frederic, MD   100 mg at 03/06/23 1030   escitalopram (LEXAPRO) tablet 10 mg  10 mg Oral  Daily Swinteck, Arlys John, MD   10 mg at 03/06/23 1030   feeding supplement (ENSURE ENLIVE / ENSURE PLUS) liquid 237 mL  237 mL Oral BID BM Swinteck, Arlys John, MD   237 mL at 03/06/23 1030   HYDROcodone-acetaminophen (NORCO/VICODIN) 5-325 MG per tablet 1-2 tablet  1-2 tablet Oral Q6H PRN Samson Frederic, MD       levothyroxine (SYNTHROID) tablet 75 mcg  75 mcg Oral Q0600 Samson Frederic, MD   75 mcg at 03/06/23 0640   menthol-cetylpyridinium (CEPACOL) lozenge 3 mg  1 lozenge Oral PRN Swinteck,  Arlys John, MD       Or   phenol (CHLORASEPTIC) mouth spray 1 spray  1 spray Mouth/Throat PRN Swinteck, Arlys John, MD       methocarbamol (ROBAXIN) tablet 500 mg  500 mg Oral Q6H PRN Swinteck, Arlys John, MD       Or   methocarbamol (ROBAXIN) injection 500 mg  500 mg Intravenous Q6H PRN Swinteck, Arlys John, MD       metoCLOPramide (REGLAN) tablet 5-10 mg  5-10 mg Oral Q8H PRN Swinteck, Arlys John, MD       Or   metoCLOPramide (REGLAN) injection 5-10 mg  5-10 mg Intravenous Q8H PRN Swinteck, Arlys John, MD       metoprolol tartrate (LOPRESSOR) tablet 50 mg  50 mg Oral BID Candelaria Stagers T, MD   50 mg at 03/06/23 1030   morphine (PF) 2 MG/ML injection 0.5 mg  0.5 mg Intravenous Q2H PRN Swinteck, Arlys John, MD       multivitamin with minerals tablet 1 tablet  1 tablet Oral Daily Swinteck, Arlys John, MD   1 tablet at 03/06/23 1030   ondansetron (ZOFRAN) tablet 4 mg  4 mg Oral Q6H PRN Swinteck, Arlys John, MD       Or   ondansetron (ZOFRAN) injection 4 mg  4 mg Intravenous Q6H PRN Swinteck, Arlys John, MD       polyethylene glycol (MIRALAX / GLYCOLAX) packet 17 g  17 g Oral Daily PRN Swinteck, Arlys John, MD       Rivaroxaban (XARELTO) tablet 15 mg  15 mg Oral Q supper Samson Frederic, MD   15 mg at 03/06/23 1045   rosuvastatin (CRESTOR) tablet 10 mg  10 mg Oral Daily Swinteck, Arlys John, MD   10 mg at 03/06/23 1045   senna (SENOKOT) tablet 8.6 mg  1 tablet Oral BID Samson Frederic, MD   8.6 mg at 03/06/23 1045     Discharge Medications: Please see discharge summary for a list of discharge medications.  Relevant Imaging Results:  Relevant Lab Results:   Additional Information SS# [id number]  Amada Jupiter, LCSW

## 2023-03-06 NOTE — TOC Initial Note (Signed)
Transition of Care [facility]) - Initial/Assessment Note    Patient Details  Name: Theresa [name] MRN: [id number] Date of Birth: June 12, 1931  [facility]) CM/SW Contact:    Amada Jupiter, LCSW Phone Number: 03/06/2023, 4:15 PM  Clinical Narrative:           Met with pt this afternoon to review dc plans.  Pt reports that she and spouse have only been in their [place] apt at [facility] for ~ 2 weeks so she is frustrated with overall situation.  She is aware that PT has recommended SNF for rehab prior to return to the apt and is agreeable with this.  She is aware that there are no rehab beds available at [facility] which would be preferred plan.  She has option of either returning to [facility] as private pay OR going to another SNF under her insurance. She would like to discuss further with her spouse and their elder law liaison tomorrow.  Will ask weekend TOC to follow up with her on decision.  FL2 is completed to send when she has made decision.          Expected Discharge Plan: Skilled Nursing Facility Barriers to Discharge: Continued Medical Work up, English as a second language teacher   Patient Goals and CMS Choice Patient states their goals for this hospitalization and ongoing recovery are:: return home following SNF rehab          Expected Discharge Plan and Services In-house Referral: Clinical Social Work   Post Acute Care Choice: Skilled Nursing Facility Living arrangements for the past 2 months: Independent Press photographer ([facility])                                      Prior Living Arrangements/Services Living arrangements for the past 2 months: Marketing executive ([facility]) Lives with:: Spouse Patient language and need for interpreter reviewed:: Yes Do you feel safe going back to the place where you live?: Yes      Need for Family Participation in Patient Care: Yes (Comment) Care giver support system in place?: Yes  (comment)   Criminal Activity/Legal Involvement Pertinent to Current Situation/Hospitalization: No - Comment as needed  Activities of Daily Living   ADL Screening (condition at time of admission) Independently performs ADLs?: No Does the patient have a NEW difficulty with bathing/dressing/toileting/self-feeding that is expected to last >3 days?: Yes (Initiates electronic notice to provider for possible OT consult) Does the patient have a NEW difficulty with getting in/out of bed, walking, or climbing stairs that is expected to last >3 days?: Yes (Initiates electronic notice to provider for possible PT consult) Does the patient have a NEW difficulty with communication that is expected to last >3 days?: No Is the patient deaf or have difficulty hearing?: Yes Does the patient have difficulty seeing, even when wearing glasses/contacts?: No Does the patient have difficulty concentrating, remembering, or making decisions?: No  Permission Sought/Granted Permission sought to share information with : Family Supports Permission granted to share information with : Yes, Verbal Permission Granted  Share Information with NAME: spouse, Rodena Medin @ [phone]           Emotional Assessment Appearance:: Appears stated age Attitude/Demeanor/Rapport: Gracious Affect (typically observed): Pleasant, Quiet Orientation: : Oriented to Self, Oriented to Place, Oriented to  Time, Oriented to Situation Alcohol / Substance Use: Not Applicable Psych Involvement:  No (comment)  Admission diagnosis:  Hip fracture (HCC) [S72.009A] Closed fracture of neck of right femur, initial encounter (HCC) [S72.001A] Fall in home, initial encounter [W19.Lorne Skeens, Y92.009] Patient Active Problem List   Diagnosis Date Noted   Fracture of femoral neck, right, closed (HCC) 03/03/2023   Paroxysmal atrial fibrillation with RVR (HCC) 03/03/2023   Depression 03/03/2023   Pain due to onychomycosis of toenails of both feet 12/23/2019    Balance disorder 05/03/2018   Macular degeneration 03/31/2017   Difficulty swallowing pills 11/26/2016   Hyperlipidemia 09/01/2014   CKD (chronic kidney disease), stage III (HCC) 03/02/2014   Rosacea 11/23/2013   Major depression in full remission (HCC) 11/23/2013   TIA (transient ischemic attack) 03/21/2013   Hypertension 03/04/2012   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 05/30/2011   Hx of ischemic left MCA stroke 04/08/2011   History of colonic polyps 10/22/2009   CHANGE IN BOWELS 09/20/2009   GASTROESOPHAGEAL REFLUX DISEASE, MILD 06/04/2009   Raynaud's syndrome 03/06/2009   Idiopathic scoliosis and kyphoscoliosis 01/03/2009   Paroxysmal atrial fibrillation (HCC) 07/07/2007   Hypothyroidism 11/23/2006   Osteopenia 11/23/2006   History of mitral valve repair 09/11/2006   Osteoarthritis 09/11/2006   PCP:  Shelva Majestic, MD Pharmacy:   [id number] [place], [place] - [address] [place] [address] Phone: [phone] Fax: [phone]     Social Drivers of Health (SDOH) Social History: SDOH Screenings   Food Insecurity: No Food Insecurity (03/02/2023)  Housing: Low Risk  (05/23/2022)   Received from [facility]  Transportation Needs: No Transportation Needs (03/02/2023)  Utilities: Not At Risk (03/02/2023)  Alcohol Screen: Low Risk  (12/12/2020)  Depression (PHQ2-9): Low Risk  (03/02/2023)  Financial Resource Strain: Low Risk  (03/02/2023)  Physical Activity: Insufficiently Active (03/02/2023)  Social Connections: Moderately Isolated (03/02/2023)  Stress: No Stress Concern Present (03/02/2023)  Tobacco Use: Medium Risk (03/05/2023)  Health Literacy: Adequate Health Literacy (03/02/2023)   SDOH Interventions:     Readmission Risk Interventions     No data to display

## 2023-03-06 NOTE — Progress Notes (Addendum)
    Subjective:  Patient reports pain as mild.  Denies N/V/CP/SOB/Abd pain. She denies any tingling or numbness in LE bilaterally. She reports that she is doing well this morning.   Patient reports that she stopped taking her xarelto due to cost. She is not opposed to taking again.   Objective:   VITALS:   Vitals:   03/06/23 0147 03/06/23 0635 03/06/23 1047 03/06/23 1328  BP: (!) 110/58 108/82 112/66 105/64  Pulse: 63 79 71 72  Resp: 15 17 17 17   Temp:  (!) 97.5 F (36.4 C) 97.6 F (36.4 C) 98.1 F (36.7 C)  TempSrc:  Oral Oral   SpO2: 98% 100% 98% 100%  Weight:      Height:        NAD Neurologically intact ABD soft Neurovascular intact Sensation intact distally Intact pulses distally Dorsiflexion/Plantar flexion intact Incision: dressing C/D/I No cellulitis present Compartment soft   Lab Results  Component Value Date   WBC 10.9 (H) 03/06/2023   HGB 12.9 03/06/2023   HCT 39.1 03/06/2023   MCV 101.6 (H) 03/06/2023   PLT 147 (L) 03/06/2023   BMET    Component Value Date/Time   NA 130 (L) 03/06/2023 0337   K 4.0 03/06/2023 0337   CL 97 (L) 03/06/2023 0337   CO2 26 03/06/2023 0337   GLUCOSE 154 (H) 03/06/2023 0337   GLUCOSE 105 (H) 02/16/2006 1506   BUN 25 (H) 03/06/2023 0337   CREATININE 0.93 03/06/2023 0337   CREATININE 1.09 (H) 11/02/2019 1121   CALCIUM 7.7 (L) 03/06/2023 0337   GFRNONAA 59 (L) 03/06/2023 0337     Assessment/Plan: 1 Day Post-Op   Principal Problem:   Fracture of femoral neck, right, closed (HCC) Active Problems:   Hypothyroidism   History of mitral valve repair   Paroxysmal atrial fibrillation (HCC)   Osteopenia   Hx of ischemic left MCA stroke   Hypertension   CKD (chronic kidney disease), stage III (HCC)   Macular degeneration   Depression   WBAT with walker DVT ppx: Xarelto, SCDs, TEDS PO pain control PT/OT: To come today.  Dispo:  - Patient under care of the medical team disposition per their recommendation. Pain  medication printed in chart. Patient restarted on xarelto. Recommend continuing at d/c for also DVT ppx.    Clois Dupes 03/06/2023, 3:38 PM   [facility]  [place] [address], [place], [place] [address] Phone: [phone] [email or web address] Facebook  [employer]

## 2023-03-06 NOTE — Progress Notes (Signed)
PROGRESS NOTE  Theresa [name] WJX:[id number] DOB: 1931/11/04   PCP: Shelva Majestic, MD  Patient is from: [place]F.  Uses rolling walker and cane at baseline.  DOA: 03/03/2023 LOS: 3  Chief complaints Chief Complaint  Patient presents with   Hip Pain     Brief Narrative / Interim history: 89 year old F with PMH of left MCA CVA, A-fib off Xarelto for 2 weeks, MVR, HTN, impaired fusion from macular degeneration and hypothyroidism presented with right hip pain after she stumbled on a stool and fell on her right side, and admitted with acute right femoral neck fracture with mild impaction.  No prodromes, head trauma or LOC.  Orthopedic surgery consulted, and she underwent right hip hemiarthroplasty by Dr. Linna Caprice on 12/19.   Subjective: Seen and examined earlier this morning.  No major events overnight of this morning.  No complaints.  Per RN, BP dropped to 86/60 when she got up with therapy later in the morning.  She was symptomatic.  Pain metoprolol was decreased  Objective: Vitals:   03/05/23 2351 03/06/23 0147 03/06/23 0635 03/06/23 1047  BP: (!) 101/51 (!) 110/58 108/82 112/66  Pulse: 66 63 79 71  Resp: 15 15 17 17   Temp: 97.8 F (36.6 C)  (!) 97.5 F (36.4 C) 97.6 F (36.4 C)  TempSrc: Oral  Oral Oral  SpO2: 99% 98% 100% 98%  Weight:      Height:        Examination:  GENERAL: No apparent distress.  Nontoxic. HEENT: MMM.  Vision and hearing grossly intact.  NECK: Supple.  No apparent JVD.  RESP:  No IWOB.  Fair aeration bilaterally. CVS: Irregular rhythm.  Normal rate.  Heart sounds normal.  ABD/GI/GU: BS+. Abd soft, NTND.  MSK/EXT: No apparent deformity.  No edema.  Surgical dressing DCI. SKIN: Surgical dressing DCI. NEURO: Awake, alert and oriented appropriately.  No apparent focal neuro deficit. PSYCH: Calm. Normal affect.   Procedures:  12/19-right hemiarthroplasty by Dr. Linna Caprice  Microbiology summarized: MRSA PCR screen nonreactive  Assessment and  plan: Accidental fall: Stumbled on a stool and fell on the right side.  Did not strike head.  No prodrome or LOC. Acute closed right femoral neck fracture due to accidental fall -Independently ambulates using walker and cane at baseline.  Vitamin D 55.43. -S/p right hemiarthroplasty by Dr. Linna Caprice on 12/19 -Patient stable and optimized from cardiopulmonary standpoint -Pain control and VTE prophylaxis per Ortho. -PT/OT eval   Persistent A-fib: Rate controlled.  She was off Xarelto for 2 weeks -Decrease metoprolol to 50 mg twice daily and added holding parameters given soft blood pressure -Continue Xarelto -Optimize electrolytes  Orthostatic hypotension/history of hypertension: Per RN, BP dropped to 86/60 when she got up with therapy.  She was dizzy. -Decreased metoprolol and added holding parameters. -OOB/PT/OT  Macular degeneration: Increased nursing assistance as needed for transfers -Fall precaution -PT/OT   History of mitral valve repair: No acute issues suspected   Hypothyroidism: TSH normal. -Continue levothyroxine  History of ischemic left MCA CVA: Stable -Continue home meds.  Depression: Stable -Continue lexapro  Hyponatremia: Mild -Continue monitoring   AKI: Cr went up from 0.6-0.93.  She is also orthostatic. -IV NS bolus 500 cc x 1 -Continue monitoring  Increased nutrient needs Body mass index is 20.24 kg/m. Nutrition Problem: Increased nutrient needs Etiology: hip fracture Signs/Symptoms: estimated needs Interventions: Ensure Enlive (each supplement provides 350kcal and 20 grams of protein), MVI   DVT prophylaxis:  SCDs Start: 03/05/23 2239 Rivaroxaban (XARELTO)  tablet 15 mg  Code Status: Full code Family Communication: None at bedside Level of care: Med-Surg Status is: Inpatient Remains inpatient appropriate because: Due to right femoral neck fracture   Final disposition: TBD after therapy evaluation Consultants:  Orthopedic surgery  55  minutes with more than 50% spent in reviewing records, counseling patient/family and coordinating care.   Sch Meds:  Scheduled Meds:  calcium-vitamin D  1 tablet Oral BID WC   chlorhexidine  15 mL Mouth/Throat Once   docusate sodium  100 mg Oral BID   escitalopram  10 mg Oral Daily   feeding supplement  237 mL Oral BID BM   levothyroxine  75 mcg Oral Q0600   metoprolol tartrate  50 mg Oral BID   multivitamin with minerals  1 tablet Oral Daily   rivaroxaban  15 mg Oral Q supper   rosuvastatin  10 mg Oral Daily   senna  1 tablet Oral BID   Continuous Infusions:  sodium chloride     PRN Meds:.HYDROcodone-acetaminophen, menthol-cetylpyridinium **OR** phenol, methocarbamol **OR** methocarbamol (ROBAXIN) injection, metoCLOPramide **OR** metoCLOPramide (REGLAN) injection, morphine injection, ondansetron **OR** ondansetron (ZOFRAN) IV, polyethylene glycol  Antimicrobials: Anti-infectives (From admission, onward)    Start     Dose/Rate Route Frequency Ordered Stop   03/06/23 0100  ceFAZolin (ANCEF) IVPB 2g/100 mL premix        2 g 200 mL/hr over 30 Minutes Intravenous Every 6 hours 03/05/23 2238 03/06/23 0710   03/05/23 1545  ceFAZolin (ANCEF) IVPB 2g/100 mL premix        2 g 200 mL/hr over 30 Minutes Intravenous On call to O.R. 03/05/23 1444 03/05/23 1919        I have personally reviewed the following labs and images: CBC: Recent Labs  Lab 03/03/23 2124 03/05/23 0748 03/06/23 0337  WBC 10.9* 9.2 10.9*  NEUTROABS 9.0*  --   --   HGB 13.4 13.1 12.9  HCT 41.0 40.1 39.1  MCV 102.2* 100.5* 101.6*  PLT 150 151 147*   BMP &GFR Recent Labs  Lab 03/03/23 2200 03/05/23 0748 03/06/23 0337  NA 136 136 130*  K 3.5 4.0 4.0  CL 103 102 97*  CO2 25 27 26   GLUCOSE 106* 133* 154*  BUN 34* 22 25*  CREATININE 0.70 0.60 0.93  CALCIUM 8.6* 8.2* 7.7*  MG  --  2.1  --   PHOS  --  1.9* 3.1   Estimated Creatinine Clearance: 29.5 mL/min (by C-G formula based on SCr of 0.93  mg/dL). Liver & Pancreas: Recent Labs  Lab 03/03/23 2200 03/05/23 0748 03/06/23 0337  AST 29  --   --   ALT 42  --   --   ALKPHOS 81  --   --   BILITOT 0.7  --   --   PROT 5.9*  --   --   ALBUMIN 3.6 3.0* 3.0*   No results for input(s): "LIPASE", "AMYLASE" in the last 168 hours. No results for input(s): "AMMONIA" in the last 168 hours. Diabetic: No results for input(s): "HGBA1C" in the last 72 hours. No results for input(s): "GLUCAP" in the last 168 hours. Cardiac Enzymes: No results for input(s): "CKTOTAL", "CKMB", "CKMBINDEX", "TROPONINI" in the last 168 hours. No results for input(s): "PROBNP" in the last 8760 hours. Coagulation Profile: Recent Labs  Lab 03/03/23 2200  INR 1.1   Thyroid Function Tests: Recent Labs    03/05/23 0404  TSH 1.775   Lipid Profile: No results for input(s): "CHOL", "HDL", "LDLCALC", "  TRIG", "CHOLHDL", "LDLDIRECT" in the last 72 hours. Anemia Panel: No results for input(s): "VITAMINB12", "FOLATE", "FERRITIN", "TIBC", "IRON", "RETICCTPCT" in the last 72 hours. Urine analysis:    Component Value Date/Time   COLORURINE YELLOW 03/21/2013 1242   APPEARANCEUR CLEAR 03/21/2013 1242   LABSPEC 1.007 03/21/2013 1242   PHURINE 6.5 03/21/2013 1242   GLUCOSEU NEGATIVE 03/21/2013 1242   HGBUR NEGATIVE 03/21/2013 1242   HGBUR negative 12/19/2009 0945   BILIRUBINUR SMALL (A) 03/21/2013 1242   BILIRUBINUR 2+ 01/13/2013 1027   KETONESUR NEGATIVE 03/21/2013 1242   PROTEINUR NEGATIVE 03/21/2013 1242   UROBILINOGEN 0.2 03/21/2013 1242   NITRITE NEGATIVE 03/21/2013 1242   LEUKOCYTESUR NEGATIVE 03/21/2013 1242   Sepsis Labs: Invalid input(s): "PROCALCITONIN", "LACTICIDVEN"  Microbiology: Recent Results (from the past 240 hours)  Surgical pcr screen     Status: None   Collection Time: 03/04/23  9:32 PM   Specimen: Nasal Mucosa; Nasal Swab  Result Value Ref Range Status   MRSA, PCR NEGATIVE NEGATIVE Final   Staphylococcus aureus NEGATIVE NEGATIVE  Final    Comment: (NOTE) The Xpert SA Assay (FDA approved for NASAL specimens in patients 3 years of age and older), is one component of a comprehensive surveillance program. It is not intended to diagnose infection nor to guide or monitor treatment. Performed at [facility], [address], [place], [place] [address]     Radiology Studies: DG HIP UNILAT WITH PELVIS 1V RIGHT Result Date: 03/05/2023 CLINICAL DATA:  Right hip arthroplasty, intraoperative examination EXAM: DG HIP (WITH OR WITHOUT PELVIS) 1V RIGHT COMPARISON:  03/03/2023 FINDINGS: Six fluoroscopic intraoperative radiographs of the reported right hip demonstrate, initially, a subcapital right femoral neck fracture with subsequent surgical changes right hip bipolar hemiarthroplasty. Final images demonstrate arthroplasty components overlie the expected position. No unexpected fracture or dislocation. Fluoroscopic images: 6 Fluoroscopic time: 0.1 minutes Fluoroscopic dose: 0.3803 mGy IMPRESSION: 1. Fluoroscopic guidance for right hip hemiarthroplasty. Electronically Signed   By: Helyn Numbers M.D.   On: 03/05/2023 23:59   DG C-Arm 1-60 Min-No Report Result Date: 03/05/2023 Fluoroscopy was utilized by the requesting physician.  No radiographic interpretation.      Morna Flud T. Glyndon Tursi [facility]  If 7PM-7AM, please contact night-coverage [email or web address] 03/06/2023, 12:29 PM

## 2023-03-06 NOTE — Evaluation (Signed)
Physical Therapy Evaluation Patient Details Name: Theresa [name] MRN: [id number] DOB: Sep 23, 1931 Today's Date: 03/06/2023  History of Present Illness  Pt s/p fall with R hip fx and now s/p R hemi-arthroplasty by anterior direct approach.  Pt with hx of MVP, CVA, a-fib and lumar fusion  Clinical Impression  Pt admitted as above and presenting with functional mobility limitations 2* decreased R LE strength/ROM, post op pain, and c/o dizziness with OOB activity - BP 86/60 - RN aware.  Patient will benefit from continued inpatient follow up therapy, <3 hours/day.         If plan is discharge home, recommend the following: A lot of help with walking and/or transfers;A little help with bathing/dressing/bathroom;Assistance with cooking/housework;Assist for transportation;Help with stairs or ramp for entrance   Can travel by private vehicle   No    Equipment Recommendations None recommended by PT  Recommendations for Other Services       Functional Status Assessment Patient has had a recent decline in their functional status and demonstrates the ability to make significant improvements in function in a reasonable and predictable amount of time.     Precautions / Restrictions Precautions Precautions: Fall Precaution Comments: orthostatic on eval Restrictions Weight Bearing Restrictions Per Provider Order: No Other Position/Activity Restrictions: WBAT      Mobility  Bed Mobility Overal bed mobility: Needs Assistance Bed Mobility: Supine to Sit           General bed mobility comments: cues for sequence and use of L LE to self assist.  INcreased time with physical assist to manage R LE, control trunk and complete rotation to EOB sitting    Transfers Overall transfer level: Needs assistance Equipment used: Rolling walker (2 wheels) Transfers: Sit to/from Stand Sit to Stand: Min assist, Mod assist           General transfer comment: cues for LE management and use of  UEs to self assist.  Physical assist to bring wt up and fwd and to balance in initial standing with RW    Ambulation/Gait Ambulation/Gait assistance: Min assist Gait Distance (Feet): 28 Feet Assistive device: Rolling walker (2 wheels) Gait Pattern/deviations: Step-to pattern, Decreased step length - left, Decreased step length - right, Shuffle, Trunk flexed Gait velocity: decr     General Gait Details: cues for sequence, posture and position from RW; distance ltd by increasing c/o dizziness - BP 86/60  Stairs            Wheelchair Mobility     Tilt Bed    Modified Rankin (Stroke Patients Only)       Balance Overall balance assessment: Needs assistance Sitting-balance support: Feet supported, No upper extremity supported Sitting balance-Leahy Scale: Fair     Standing balance support: Bilateral upper extremity supported Standing balance-Leahy Scale: Poor                               Pertinent Vitals/Pain Pain Assessment Pain Assessment: 0-10 Pain Score: 3  Pain Location: R hip/thigh Pain Descriptors / Indicators: Aching, Sore Pain Intervention(s): Limited activity within patient's tolerance, Monitored during session, Premedicated before session, Ice applied    Home Living Family/patient expects to be discharged to:: Skilled nursing facility                   Additional Comments: [facility] - pt is resident of [facility] [address] IND living  Prior Function Prior Level of Function : Needs assist             Mobility Comments: using Cane or rollator       Extremity/Trunk Assessment   Upper Extremity Assessment Upper Extremity Assessment: Generalized weakness    Lower Extremity Assessment Lower Extremity Assessment: Generalized weakness;RLE deficits/detail    Cervical / Trunk Assessment Cervical / Trunk Assessment: Kyphotic  Communication   Communication Communication: No apparent difficulties  Cognition  Arousal: Alert Behavior During Therapy: WFL for tasks assessed/performed, Flat affect Overall Cognitive Status: Within Functional Limits for tasks assessed                                          General Comments      Exercises Total Joint Exercises Ankle Circles/Pumps: AROM, Both, 15 reps, Supine   Assessment/Plan    PT Assessment Patient needs continued PT services  PT Problem List Decreased strength;Decreased range of motion;Decreased activity tolerance;Decreased balance;Decreased mobility;Decreased knowledge of use of DME;Pain       PT Treatment Interventions DME instruction;Gait training;Stair training;Functional mobility training;Therapeutic activities;Therapeutic exercise;Patient/family education;Balance training    PT Goals (Current goals can be found in the Care Plan section)  Acute Rehab PT Goals Patient Stated Goal: [facility] IND PT Goal Formulation: With patient Time For Goal Achievement: 03/20/23 Potential to Achieve Goals: Fair    Frequency Min 1X/week     Co-evaluation               AM-PAC PT "6 Clicks" Mobility  Outcome Measure Help needed turning from your back to your side while in a flat bed without using bedrails?: A Lot Help needed moving from lying on your back to sitting on the side of a flat bed without using bedrails?: A Lot Help needed moving to and from a bed to a chair (including a wheelchair)?: A Lot Help needed standing up from a chair using your arms (e.g., wheelchair or bedside chair)?: A Lot Help needed to walk in hospital room?: A Lot Help needed climbing 3-5 steps with a railing? : A Lot 6 Click Score: 12    End of Session Equipment Utilized During Treatment: Gait belt Activity Tolerance: Patient tolerated treatment well Patient left: in chair;with call bell/phone within reach;with chair alarm set Nurse Communication: Mobility status PT Visit Diagnosis: Difficulty in walking, not elsewhere classified (R26.2)     Time: 1204-1229 PT Time Calculation (min) (ACUTE ONLY): 25 min   Charges:   PT Evaluation $PT Eval Low Complexity: 1 Low PT Treatments $Gait Training: 8-22 mins PT General Charges $$ ACUTE PT VISIT: 1 Visit         Mauro Kaufmann PT Acute Rehabilitation Services Pager [phone] Office [phone]   Gino Garrabrant 03/06/2023, 3:26 PM

## 2023-03-06 NOTE — Plan of Care (Signed)
  Problem: Education: Goal: Knowledge of General Education information will improve Description: Including pain rating scale, medication(s)/side effects and non-pharmacologic comfort measures Outcome: Progressing   Problem: Health Behavior/Discharge Planning: Goal: Ability to manage health-related needs will improve Outcome: Progressing   Problem: Clinical Measurements: Goal: Ability to maintain clinical measurements within normal limits will improve Outcome: Progressing Goal: Will remain free from infection Outcome: Progressing Goal: Diagnostic test results will improve Outcome: Progressing Goal: Respiratory complications will improve Outcome: Progressing Goal: Cardiovascular complication will be avoided Outcome: Progressing   Problem: Activity: Goal: Risk for activity intolerance will decrease Outcome: Progressing   Problem: Nutrition: Goal: Adequate nutrition will be maintained Outcome: Progressing   Problem: Coping: Goal: Level of anxiety will decrease Outcome: Progressing   Problem: Elimination: Goal: Will not experience complications related to bowel motility Outcome: Progressing Goal: Will not experience complications related to urinary retention Outcome: Progressing   Problem: Pain Management: Goal: General experience of comfort will improve Outcome: Progressing   Problem: Safety: Goal: Ability to remain free from injury will improve Outcome: Progressing   Problem: Skin Integrity: Goal: Risk for impaired skin integrity will decrease Outcome: Progressing   Problem: Education: Goal: Knowledge of the prescribed therapeutic regimen will improve Outcome: Progressing Goal: Understanding of discharge needs will improve Outcome: Progressing Goal: Individualized Educational Video(s) Outcome: Progressing   Problem: Pain Management: Goal: Pain level will decrease Outcome: Progressing   Problem: Activity: Goal: Ability to avoid complications of mobility  impairment will improve Outcome: Progressing Goal: Ability to tolerate increased activity will improve Outcome: Progressing   Problem: Clinical Measurements: Goal: Postoperative complications will be avoided or minimized Outcome: Progressing   Problem: Pain Management: Goal: Pain level will decrease with appropriate interventions Outcome: Progressing   Problem: Skin Integrity: Goal: Will show signs of wound healing Outcome: Progressing

## 2023-03-07 DIAGNOSIS — I10 Essential (primary) hypertension: Secondary | ICD-10-CM | POA: Diagnosis not present

## 2023-03-07 DIAGNOSIS — I480 Paroxysmal atrial fibrillation: Secondary | ICD-10-CM | POA: Diagnosis not present

## 2023-03-07 DIAGNOSIS — E034 Atrophy of thyroid (acquired): Secondary | ICD-10-CM | POA: Diagnosis not present

## 2023-03-07 DIAGNOSIS — S72001D Fracture of unspecified part of neck of right femur, subsequent encounter for closed fracture with routine healing: Secondary | ICD-10-CM | POA: Diagnosis not present

## 2023-03-07 LAB — RENAL FUNCTION PANEL
Albumin: 2.8 g/dL — ABNORMAL LOW (ref 3.5–5.0)
Anion gap: 6 (ref 5–15)
BUN: 34 mg/dL — ABNORMAL HIGH (ref 8–23)
CO2: 29 mmol/L (ref 22–32)
Calcium: 8.5 mg/dL — ABNORMAL LOW (ref 8.9–10.3)
Chloride: 98 mmol/L (ref 98–111)
Creatinine, Ser: 0.89 mg/dL (ref 0.44–1.00)
GFR, Estimated: >60 mL/min (ref >60)
Glucose, Bld: 131 mg/dL — ABNORMAL HIGH (ref 70–99)
Phosphorus: 1.6 mg/dL — ABNORMAL LOW (ref 2.5–4.6)
Potassium: 4.6 mmol/L (ref 3.5–5.1)
Sodium: 133 mmol/L — ABNORMAL LOW (ref 135–145)

## 2023-03-07 LAB — CBC
HCT: 36.3 % (ref 36.0–46.0)
Hemoglobin: 11.7 g/dL — ABNORMAL LOW (ref 12.0–15.0)
MCH: 32.4 pg (ref 26.0–34.0)
MCHC: 32.2 g/dL (ref 30.0–36.0)
MCV: 100.6 fL — ABNORMAL HIGH (ref 80.0–100.0)
Platelets: 173 10*3/uL (ref 150–400)
RBC: 3.61 MIL/uL — ABNORMAL LOW (ref 3.87–5.11)
RDW: 13 % (ref 11.5–15.5)
WBC: 13.3 10*3/uL — ABNORMAL HIGH (ref 4.0–10.5)
nRBC: 0 % (ref 0.0–0.2)

## 2023-03-07 LAB — MAGNESIUM: Magnesium: 2.1 mg/dL (ref 1.7–2.4)

## 2023-03-07 MED ORDER — METHOCARBAMOL 500 MG PO TABS
500.0000 mg | ORAL_TABLET | Freq: Three times a day (TID) | ORAL | Status: DC | PRN
  Administered 2023-03-09: 500 mg via ORAL
  Filled 2023-03-07: qty 1

## 2023-03-07 MED ORDER — QUETIAPINE FUMARATE 25 MG PO TABS
25.0000 mg | ORAL_TABLET | Freq: Two times a day (BID) | ORAL | Status: DC | PRN
  Administered 2023-03-07 – 2023-03-08 (×2): 25 mg via ORAL
  Filled 2023-03-07 (×2): qty 1

## 2023-03-07 MED ORDER — METOPROLOL TARTRATE 25 MG PO TABS
25.0000 mg | ORAL_TABLET | Freq: Two times a day (BID) | ORAL | Status: DC
  Administered 2023-03-07 (×2): 25 mg via ORAL
  Filled 2023-03-07 (×2): qty 1

## 2023-03-07 MED ORDER — SODIUM PHOSPHATES 45 MMOLE/15ML IV SOLN
30.0000 mmol | Freq: Once | INTRAVENOUS | Status: AC
  Administered 2023-03-07: 30 mmol via INTRAVENOUS
  Filled 2023-03-07: qty 10

## 2023-03-07 MED ORDER — ACETAMINOPHEN 325 MG PO TABS
650.0000 mg | ORAL_TABLET | Freq: Four times a day (QID) | ORAL | Status: DC
Start: 2023-03-07 — End: 2023-03-10
  Administered 2023-03-07 – 2023-03-09 (×8): 650 mg via ORAL
  Filled 2023-03-07 (×8): qty 2

## 2023-03-07 MED ORDER — OXYCODONE HCL 5 MG PO TABS
5.0000 mg | ORAL_TABLET | Freq: Four times a day (QID) | ORAL | Status: DC | PRN
  Filled 2023-03-07 (×2): qty 1

## 2023-03-07 NOTE — Progress Notes (Signed)
    Subjective:  Patient reports pain as mild.  Denies N/V/CP/SOB/Abd pain. She denies any tingling or numbness in LE bilaterally. She reports that she is doing well this morning.   Objective:   VITALS:   Vitals:   03/06/23 2142 03/06/23 2144 03/07/23 0611 03/07/23 0832  BP: 117/66 117/66 134/69 135/85  Pulse: (!) 101 91 (!) 101 63  Resp:  16 18 18   Temp:  99.8 F (37.7 C) 98.8 F (37.1 C) 97.7 F (36.5 C)  TempSrc:  Oral Oral Oral  SpO2:  95% 93% 93%  Weight:      Height:        NAD Neurologically intact ABD soft Neurovascular intact Sensation intact distally Intact pulses distally Dorsiflexion/Plantar flexion intact Incision: dressing C/D/I No cellulitis present Compartment soft   Lab Results  Component Value Date   WBC 13.3 (H) 03/07/2023   HGB 11.7 (L) 03/07/2023   HCT 36.3 03/07/2023   MCV 100.6 (H) 03/07/2023   PLT 173 03/07/2023   BMET    Component Value Date/Time   NA 133 (L) 03/07/2023 0352   K 4.6 03/07/2023 0352   CL 98 03/07/2023 0352   CO2 29 03/07/2023 0352   GLUCOSE 131 (H) 03/07/2023 0352   GLUCOSE 105 (H) 02/16/2006 1506   BUN 34 (H) 03/07/2023 0352   CREATININE 0.89 03/07/2023 0352   CREATININE 1.09 (H) 11/02/2019 1121   CALCIUM 8.5 (L) 03/07/2023 0352   GFRNONAA >60 03/07/2023 0352     Assessment/Plan: 2 Days Post-Op   Principal Problem:   Fracture of femoral neck, right, closed (HCC) Active Problems:   Hypothyroidism   History of mitral valve repair   Paroxysmal atrial fibrillation (HCC)   Osteopenia   Hx of ischemic left MCA stroke   Hypertension   CKD (chronic kidney disease), stage III (HCC)   Macular degeneration   Depression   WBAT with walker DVT ppx: Xarelto, SCDs, TEDS PO pain control PT/OT Dispo:  - Patient under care of the medical team disposition per their recommendation. Pain medication printed in chart. Patient restarted on xarelto. Recommend continuing at d/c for also DVT ppx.    Netta Cedars 03/07/2023, 9:23 AM   [facility]  [place] [address], [address], [place], [place] [address] Phone: [phone] [email or web address] Facebook  [employer]

## 2023-03-07 NOTE — Progress Notes (Signed)
PROGRESS NOTE  Theresa [name] ZOX:[id number] DOB: [birth year]   PCP: Shelva Majestic, MD  [facility] is from: [facility].  Uses rolling walker and cane at baseline.  DOA: 03/03/2023 LOS: 4  Chief complaints Chief Complaint  Patient presents with   Hip Pain     Brief Narrative / Interim history: 89 year old F with PMH of left MCA CVA, A-fib off Xarelto for 2 weeks, MVR, HTN, impaired fusion from macular degeneration and hypothyroidism presented with right hip pain after she stumbled on a stool and fell on her right side, and admitted with acute right femoral neck fracture with mild impaction.  No prodromes, head trauma or LOC.  Orthopedic surgery consulted, and she underwent right hip hemiarthroplasty by Dr. Linna Caprice on 12/19.   Subjective: Seen and examined earlier this morning.  Somewhat confused and agitated overnight.  Wants to go home.  She says pain is fairly controlled.  She was orthostatic yesterday.   Objective: Vitals:   03/06/23 2144 03/07/23 0611 03/07/23 0832 03/07/23 1356  BP: 117/66 134/69 135/85 108/63  Pulse: 91 (!) 101 63 88  Resp: 16 18 18 15   Temp: 99.8 F (37.7 C) 98.8 F (37.1 C) 97.7 F (36.5 C) 97.7 F (36.5 C)  TempSrc: Oral Oral Oral   SpO2: 95% 93% 93% (!) 89%  Weight:      Height:        Examination:  GENERAL: No apparent distress.  Nontoxic.  Sitting on bedside chair. HEENT: MMM.  Vision and hearing grossly intact.  NECK: Supple.  No apparent JVD.  RESP:  No IWOB.  Fair aeration bilaterally. CVS: Irregular rhythm.  Normal rate.  Heart sounds normal.  ABD/GI/GU: BS+. Abd soft, NTND.  MSK/EXT: No apparent deformity.  No edema.  Surgical dressing DCI. SKIN: Surgical dressing DCI. NEURO: Awake, alert and oriented fairly.  No apparent focal neuro deficit. PSYCH: Calm. Normal affect.   Procedures:  12/19-right hemiarthroplasty by Dr. Linna Caprice  Microbiology summarized: MRSA PCR screen nonreactive  Assessment and plan: Accidental fall:  Stumbled on a stool and fell on the right side.  Did not strike head.  No prodrome or LOC. Acute closed right femoral neck fracture due to accidental fall -Independently ambulates using walker and cane at baseline.  Vitamin D 55.43. -S/p right hemiarthroplasty by Dr. Linna Caprice on 12/19 -Pain control and VTE prophylaxis per Ortho. -PT/OT eval   Persistent A-fib: Rate controlled.  She was off Xarelto for 2 weeks -Decrease metoprolol to 50 mg twice daily and added holding parameters given soft blood pressure -Continue Xarelto -Optimize electrolytes  Orthostatic hypotension/history of hypertension: Per RN, BP dropped to 86/60 when she got up with therapy on 12/20.  Less orthostatic today.. -Decrease metoprolol to 25 mg twice daily.  Takes 100 mg twice daily at home -OOB/PT/OT  Confusion/agitation: Likely delirium. -Adjusted pain meds with scheduled Tylenol and others as needed -P.o. Seroquel 25 mg twice daily as needed -Reorientation and delirium precaution  Macular degeneration: Increased nursing assistance as needed for transfers -Fall precaution -PT/OT   History of mitral valve repair: No acute issues suspected   Hypothyroidism: TSH normal. -Continue levothyroxine  History of ischemic left MCA CVA: Stable -Continue home meds.  Depression: Stable -Continue lexapro  Hyponatremia: Mild -Continue monitoring   AKI: Cr went up from 0.6-0.93.  She is also orthostatic. -Continue monitoring -Avoid hypotension.  Increased nutrient needs Body mass index is 20.24 kg/m. Nutrition Problem: Increased nutrient needs Etiology: hip fracture Signs/Symptoms: estimated needs Interventions: Ensure Enlive (each supplement  provides 350kcal and 20 grams of protein), MVI   DVT prophylaxis:  SCDs Start: 03/05/23 2239 Rivaroxaban (XARELTO) tablet 15 mg  Code Status: Full code Family Communication: Attempted to call patient's husband but phone not ringing.  Does not seem to work. Level of  care: Med-Surg Status is: Inpatient Remains inpatient appropriate because: Due to right femoral neck fracture   Final disposition: TBD after therapy evaluation Consultants:  Orthopedic surgery  35 minutes with more than 50% spent in reviewing records, counseling patient/family and coordinating care.   Sch Meds:  Scheduled Meds:  acetaminophen  650 mg Oral Q6H WA   calcium-vitamin D  1 tablet Oral BID WC   chlorhexidine  15 mL Mouth/Throat Once   docusate sodium  100 mg Oral BID   escitalopram  10 mg Oral Daily   feeding supplement  237 mL Oral BID BM   levothyroxine  75 mcg Oral Q0600   metoprolol tartrate  25 mg Oral BID   multivitamin with minerals  1 tablet Oral Daily   rivaroxaban  15 mg Oral Q supper   rosuvastatin  10 mg Oral Daily   senna  1 tablet Oral BID   Continuous Infusions:  sodium PHOSPHATE IVPB (in mmol) 30 mmol (03/07/23 1045)   PRN Meds:.menthol-cetylpyridinium **OR** phenol, methocarbamol **OR** [DISCONTINUED] methocarbamol (ROBAXIN) injection, metoCLOPramide **OR** metoCLOPramide (REGLAN) injection, ondansetron **OR** ondansetron (ZOFRAN) IV, oxyCODONE, polyethylene glycol, QUEtiapine  Antimicrobials: Anti-infectives (From admission, onward)    Start     Dose/Rate Route Frequency Ordered Stop   03/06/23 0100  ceFAZolin (ANCEF) IVPB 2g/100 mL premix        2 g 200 mL/hr over 30 Minutes Intravenous Every 6 hours 03/05/23 2238 03/06/23 0710   03/05/23 1545  ceFAZolin (ANCEF) IVPB 2g/100 mL premix        2 g 200 mL/hr over 30 Minutes Intravenous On call to O.R. 03/05/23 1444 03/05/23 1919        I have personally reviewed the following labs and images: CBC: Recent Labs  Lab 03/03/23 2124 03/05/23 0748 03/06/23 0337 03/07/23 0352  WBC 10.9* 9.2 10.9* 13.3*  NEUTROABS 9.0*  --   --   --   HGB 13.4 13.1 12.9 11.7*  HCT 41.0 40.1 39.1 36.3  MCV 102.2* 100.5* 101.6* 100.6*  PLT 150 151 147* 173   BMP &GFR Recent Labs  Lab 03/03/23 2200  03/05/23 0748 03/06/23 0337 03/07/23 0352  NA 136 136 130* 133*  K 3.5 4.0 4.0 4.6  CL 103 102 97* 98  CO2 25 27 26 29   GLUCOSE 106* 133* 154* 131*  BUN 34* 22 25* 34*  CREATININE 0.70 0.60 0.93 0.89  CALCIUM 8.6* 8.2* 7.7* 8.5*  MG  --  2.1  --  2.1  PHOS  --  1.9* 3.1 1.6*   Estimated Creatinine Clearance: 30.8 mL/min (by C-G formula based on SCr of 0.89 mg/dL). Liver & Pancreas: Recent Labs  Lab 03/03/23 2200 03/05/23 0748 03/06/23 0337 03/07/23 0352  AST 29  --   --   --   ALT 42  --   --   --   ALKPHOS 81  --   --   --   BILITOT 0.7  --   --   --   PROT 5.9*  --   --   --   ALBUMIN 3.6 3.0* 3.0* 2.8*   No results for input(s): "LIPASE", "AMYLASE" in the last 168 hours. No results for input(s): "AMMONIA" in the last  168 hours. Diabetic: Recent Labs    03/06/23 0356  HGBA1C 5.5   No results for input(s): "GLUCAP" in the last 168 hours. Cardiac Enzymes: No results for input(s): "CKTOTAL", "CKMB", "CKMBINDEX", "TROPONINI" in the last 168 hours. No results for input(s): "PROBNP" in the last 8760 hours. Coagulation Profile: Recent Labs  Lab 03/03/23 2200  INR 1.1   Thyroid Function Tests: Recent Labs    03/05/23 0404  TSH 1.775   Lipid Profile: No results for input(s): "CHOL", "HDL", "LDLCALC", "TRIG", "CHOLHDL", "LDLDIRECT" in the last 72 hours. Anemia Panel: No results for input(s): "VITAMINB12", "FOLATE", "FERRITIN", "TIBC", "IRON", "RETICCTPCT" in the last 72 hours. Urine analysis:    Component Value Date/Time   COLORURINE YELLOW 03/21/2013 1242   APPEARANCEUR CLEAR 03/21/2013 1242   LABSPEC 1.007 03/21/2013 1242   PHURINE 6.5 03/21/2013 1242   GLUCOSEU NEGATIVE 03/21/2013 1242   HGBUR NEGATIVE 03/21/2013 1242   HGBUR negative 12/19/2009 0945   BILIRUBINUR SMALL (A) 03/21/2013 1242   BILIRUBINUR 2+ 01/13/2013 1027   KETONESUR NEGATIVE 03/21/2013 1242   PROTEINUR NEGATIVE 03/21/2013 1242   UROBILINOGEN 0.2 03/21/2013 1242   NITRITE NEGATIVE  03/21/2013 1242   LEUKOCYTESUR NEGATIVE 03/21/2013 1242   Sepsis Labs: Invalid input(s): "PROCALCITONIN", "LACTICIDVEN"  Microbiology: Recent Results (from the past 240 hours)  Surgical pcr screen     Status: None   Collection Time: 03/04/23  9:32 PM   Specimen: Nasal Mucosa; Nasal Swab  Result Value Ref Range Status   MRSA, PCR NEGATIVE NEGATIVE Final   Staphylococcus aureus NEGATIVE NEGATIVE Final    Comment: (NOTE) The Xpert SA Assay (FDA approved for NASAL specimens in patients 4 years of age and older), is one component of a comprehensive surveillance program. It is not intended to diagnose infection nor to guide or monitor treatment. Performed at [facility], [address], [place], [place] [address]     Radiology Studies: No results found.     Aryah Doering T. Eliav Mechling [facility]  If 7PM-7AM, please contact night-coverage [email or web address] 03/07/2023, 2:14 PM

## 2023-03-07 NOTE — Plan of Care (Signed)
  Problem: Coping: Goal: Level of anxiety will decrease Outcome: Progressing   Problem: Pain Management: Goal: General experience of comfort will improve Outcome: Progressing   Problem: Safety: Goal: Ability to remain free from injury will improve Outcome: Progressing

## 2023-03-07 NOTE — Progress Notes (Signed)
Pt had multiple episodes of confusion during the night. Pt tried to get out of bed without assistance. Pt was easily reoriented and assistance provided for bathroom visits. Pt refuses pain. Pt currently sitting on the recliner with chair alarm on. Snacks and drinks provided. Call bell in reach. Will continue to monitor.

## 2023-03-07 NOTE — Progress Notes (Signed)
Attempted to call pt husband per pt request at [phone]. No answer and voicemail box is full. Will try again later.

## 2023-03-07 NOTE — Plan of Care (Signed)
  Problem: Education: Goal: Knowledge of General Education information will improve Description: Including pain rating scale, medication(s)/side effects and non-pharmacologic comfort measures Outcome: Progressing   Problem: Health Behavior/Discharge Planning: Goal: Ability to manage health-related needs will improve Outcome: Progressing   Problem: Clinical Measurements: Goal: Ability to maintain clinical measurements within normal limits will improve Outcome: Progressing Goal: Will remain free from infection Outcome: Progressing Goal: Diagnostic test results will improve Outcome: Progressing Goal: Respiratory complications will improve Outcome: Progressing Goal: Cardiovascular complication will be avoided Outcome: Progressing   Problem: Activity: Goal: Risk for activity intolerance will decrease Outcome: Progressing   Problem: Nutrition: Goal: Adequate nutrition will be maintained Outcome: Progressing   Problem: Coping: Goal: Level of anxiety will decrease Outcome: Progressing   Problem: Elimination: Goal: Will not experience complications related to bowel motility Outcome: Progressing Goal: Will not experience complications related to urinary retention Outcome: Progressing   Problem: Pain Management: Goal: General experience of comfort will improve Outcome: Progressing   Problem: Safety: Goal: Ability to remain free from injury will improve Outcome: Progressing   Problem: Skin Integrity: Goal: Risk for impaired skin integrity will decrease Outcome: Progressing   Problem: Clinical Measurements: Goal: Postoperative complications will be avoided or minimized Outcome: Progressing   Problem: Self-Concept: Goal: Ability to maintain and perform role responsibilities to the fullest extent possible will improve Outcome: Progressing   Problem: Pain Management: Goal: Pain level will decrease Outcome: Progressing   Problem: Activity: Goal: Ability to avoid  complications of mobility impairment will improve Outcome: Progressing Goal: Ability to tolerate increased activity will improve Outcome: Progressing   Problem: Clinical Measurements: Goal: Postoperative complications will be avoided or minimized Outcome: Progressing   Problem: Skin Integrity: Goal: Will show signs of wound healing Outcome: Progressing

## 2023-03-07 NOTE — Evaluation (Addendum)
Occupational Therapy Evaluation Patient Details Name: Theresa [name] MRN: [id number] DOB: February 07, 1932 Today's Date: 03/07/2023   History of Present Illness Pt s/p fall with R hip fx and now s/p R hemi-arthroplasty by anterior direct approach.  Pt with hx of MVP, CVA, a-fib and lumar fusion   Clinical Impression   Pt presents with decline in function and safety with ADLs and ADL mobility with impaired strength, balance and endurance. PTA pt lived with her husband and [facility] (just moved there 2 weeks ago) and was Ind with ADLs/selfcare and used SPC for mobility. Pt currently requires mod A with LB ADLs, min A with mobility/transfers using RW, Sup with UB ADLs, min A with toileting tasks. Pt would benefit from acute OT services to maximize level of function and safety  BP Readings: Supine 129/75 Sitting 89/58 Standing 3 minutes 114/89      If plan is discharge home, recommend the following: A lot of help with bathing/dressing/bathroom;A lot of help with walking and/or transfers    Functional Status Assessment  Patient has had a recent decline in their functional status and demonstrates the ability to make significant improvements in function in a reasonable and predictable amount of time.  Equipment Recommendations  BSC/3in1;Tub/shower seat;Other (comment) (RW)    Recommendations for Other Services       Precautions / Restrictions Precautions Precautions: Fall Precaution Comments: orthostatic on eval Restrictions Weight Bearing Restrictions Per Provider Order: No Other Position/Activity Restrictions: WBAT      Mobility Bed Mobility               General bed mobility comments: pt seated EOB with nurse upon arrival    Transfers Overall transfer level: Needs assistance Equipment used: Rolling walker (2 wheels) Transfers: Sit to/from Stand Sit to Stand: Min assist           General transfer comment: cues for LE management and use of UEs to self  assist.  Physical assist to bring wt up and fwd and to balance in initial standing with RW      Balance Overall balance assessment: Needs assistance Sitting-balance support: Feet supported, No upper extremity supported Sitting balance-Leahy Scale: Fair     Standing balance support: Bilateral upper extremity supported Standing balance-Leahy Scale: Poor                             ADL either performed or assessed with clinical judgement   ADL Overall ADL's : Needs assistance/impaired Eating/Feeding: Independent   Grooming: Wash/dry hands;Wash/dry face;Contact guard assist;Standing   Upper Body Bathing: Supervision/ safety   Lower Body Bathing: Moderate assistance   Upper Body Dressing : Supervision/safety   Lower Body Dressing: Moderate assistance   Toilet Transfer: Minimal assistance;Cueing for safety;Ambulation;Rolling walker (2 wheels);Regular Toilet;Grab bars   Toileting- Clothing Manipulation and Hygiene: Minimal assistance;Sit to/from stand       Functional mobility during ADLs: Minimal assistance;Rolling walker (2 wheels);Cueing for safety       Vision Baseline Vision/History: 1 Wears glasses Ability to See in Adequate Light: 0 Adequate Patient Visual Report: No change from baseline       Perception         Praxis         Pertinent Vitals/Pain Pain Assessment Pain Assessment: 0-10 Pain Score: 6  Pain Location: R hip/thigh Pain Descriptors / Indicators: Aching, Sore Pain Intervention(s): Limited activity within patient's tolerance, Monitored during session, Premedicated before session  Extremity/Trunk Assessment Upper Extremity Assessment Upper Extremity Assessment: Generalized weakness   Lower Extremity Assessment Lower Extremity Assessment: Defer to PT evaluation   Cervical / Trunk Assessment Cervical / Trunk Assessment: Kyphotic   Communication Communication Communication: No apparent difficulties   Cognition Arousal:  Alert Behavior During Therapy: WFL for tasks assessed/performed, Flat affect Overall Cognitive Status: Within Functional Limits for tasks assessed                                       General Comments       Exercises     Shoulder Instructions      Home Living Family/patient expects to be discharged to:: Skilled nursing facility                                 Additional Comments: Hoping for [facility] - pt is resident of [facility] IND living      Prior Functioning/Environment Prior Level of Function : Needs assist             Mobility Comments: using Cane or rollator ADLs Comments: Ind with ADLs/selfcare        OT Problem List: Decreased strength;Impaired balance (sitting and/or standing);Pain;Decreased activity tolerance;Decreased knowledge of use of DME or AE      OT Treatment/Interventions: Self-care/ADL training;DME and/or AE instruction;Therapeutic activities;Patient/family education;Balance training    OT Goals(Current goals can be found in the care plan section) Acute Rehab OT Goals Patient Stated Goal: go to rehab and go home OT Goal Formulation: With patient Time For Goal Achievement: 03/21/23 Potential to Achieve Goals: Good ADL Goals Pt Will Perform Grooming: with supervision;standing Pt Will Perform Upper Body Bathing: with set-up Pt Will Perform Lower Body Bathing: with min assist Pt Will Perform Upper Body Dressing: with set-up Pt Will Transfer to Toilet: with contact guard assist;ambulating;grab bars Pt Will Perform Toileting - Clothing Manipulation and hygiene: with min assist;with contact guard assist  OT Frequency: Min 2X/week    Co-evaluation              AM-PAC OT "6 Clicks" Daily Activity     Outcome Measure Help from another person eating meals?: None Help from another person taking care of personal grooming?: A Little Help from another person toileting, which includes using toliet,  bedpan, or urinal?: A Little Help from another person bathing (including washing, rinsing, drying)?: A Little Help from another person to put on and taking off regular upper body clothing?: A Lot Help from another person to put on and taking off regular lower body clothing?: A Little 6 Click Score: 18   End of Session Equipment Utilized During Treatment: Gait belt;Rolling walker (2 wheels) Nurse Communication: Mobility status  Activity Tolerance: Patient tolerated treatment well Patient left: with call bell/phone within reach;with chair alarm set  OT Visit Diagnosis: Unsteadiness on feet (R26.81);Other abnormalities of gait and mobility (R26.89);Muscle weakness (generalized) (M62.81);Pain Pain - Right/Left: Right Pain - part of body: Hip                Time: 8119-1478 OT Time Calculation (min): 26 min Charges:  OT General Charges $OT Visit: 1 Visit OT Evaluation $OT Eval Moderate Complexity: 1 Mod OT Treatments $Therapeutic Activity: 8-22 mins   Galen Manila 03/07/2023, 1:24 PM

## 2023-03-08 DIAGNOSIS — I10 Essential (primary) hypertension: Secondary | ICD-10-CM | POA: Diagnosis not present

## 2023-03-08 DIAGNOSIS — S72001D Fracture of unspecified part of neck of right femur, subsequent encounter for closed fracture with routine healing: Secondary | ICD-10-CM | POA: Diagnosis not present

## 2023-03-08 DIAGNOSIS — E034 Atrophy of thyroid (acquired): Secondary | ICD-10-CM | POA: Diagnosis not present

## 2023-03-08 DIAGNOSIS — I480 Paroxysmal atrial fibrillation: Secondary | ICD-10-CM | POA: Diagnosis not present

## 2023-03-08 LAB — RENAL FUNCTION PANEL
Albumin: 2.8 g/dL — ABNORMAL LOW (ref 3.5–5.0)
Anion gap: 6 (ref 5–15)
BUN: 27 mg/dL — ABNORMAL HIGH (ref 8–23)
CO2: 30 mmol/L (ref 22–32)
Calcium: 8.6 mg/dL — ABNORMAL LOW (ref 8.9–10.3)
Chloride: 103 mmol/L (ref 98–111)
Creatinine, Ser: 0.64 mg/dL (ref 0.44–1.00)
GFR, Estimated: >60 mL/min (ref >60)
Glucose, Bld: 95 mg/dL (ref 70–99)
Phosphorus: 3.1 mg/dL (ref 2.5–4.6)
Potassium: 4.4 mmol/L (ref 3.5–5.1)
Sodium: 139 mmol/L (ref 135–145)

## 2023-03-08 LAB — CBC
HCT: 34.7 % — ABNORMAL LOW (ref 36.0–46.0)
Hemoglobin: 11.4 g/dL — ABNORMAL LOW (ref 12.0–15.0)
MCH: 32.8 pg (ref 26.0–34.0)
MCHC: 32.9 g/dL (ref 30.0–36.0)
MCV: 99.7 fL (ref 80.0–100.0)
Platelets: 167 10*3/uL (ref 150–400)
RBC: 3.48 MIL/uL — ABNORMAL LOW (ref 3.87–5.11)
RDW: 13.2 % (ref 11.5–15.5)
WBC: 9.5 10*3/uL (ref 4.0–10.5)
nRBC: 0 % (ref 0.0–0.2)

## 2023-03-08 LAB — MAGNESIUM: Magnesium: 2.2 mg/dL (ref 1.7–2.4)

## 2023-03-08 MED ORDER — METOPROLOL TARTRATE 50 MG PO TABS
50.0000 mg | ORAL_TABLET | Freq: Two times a day (BID) | ORAL | Status: DC
  Administered 2023-03-08: 50 mg via ORAL
  Filled 2023-03-08: qty 1

## 2023-03-08 MED ORDER — METOPROLOL TARTRATE 25 MG PO TABS
25.0000 mg | ORAL_TABLET | Freq: Two times a day (BID) | ORAL | Status: DC
  Administered 2023-03-08 – 2023-03-09 (×2): 25 mg via ORAL
  Filled 2023-03-08 (×2): qty 1

## 2023-03-08 MED ORDER — SODIUM CHLORIDE 0.9 % IV BOLUS
500.0000 mL | Freq: Once | INTRAVENOUS | Status: AC
Start: 2023-03-08 — End: 2023-03-08
  Administered 2023-03-08: 500 mL via INTRAVENOUS

## 2023-03-08 NOTE — Progress Notes (Signed)
Physical Therapy Treatment Patient Details Name: Theresa [name] MRN: [id number] DOB: [birth year] Today's Date: 03/08/2023   History of Present Illness Pt s/p fall with R hip fx and now s/p R hemi-arthroplasty by anterior direct approach.  Pt with hx of MVP, CVA, a-fib and lumar fusion    PT Comments  Pt very cooperative and up to ambulate limited distance in hall - c/o mild dizziness with standing and with BP as follows: Supine 113/77; sitting 117/74; standing 98/69; standing 3 min 131/63 and after ambulation 119/67.  Pt also up to bathroom for toileting and hand hygiene standing at sink.  Pt continues to require assist for all basic mobility tasks and is currently at high risk of additional falls.   If plan is discharge home, recommend the following: A lot of help with walking and/or transfers;A little help with bathing/dressing/bathroom;Assistance with cooking/housework;Assist for transportation;Help with stairs or ramp for entrance   Can travel by private vehicle     No  Equipment Recommendations  None recommended by PT    Recommendations for Other Services       Precautions / Restrictions Precautions Precautions: Fall Precaution Comments: orthostatic Restrictions Weight Bearing Restrictions Per Provider Order: No Other Position/Activity Restrictions: WBAT     Mobility  Bed Mobility Overal bed mobility: Needs Assistance Bed Mobility: Sit to Supine     Supine to sit: Min assist, Mod assist Sit to supine: Mod assist   General bed mobility comments: Increased time with cues for sequence physical assist to manage bil LE into bed    Transfers Overall transfer level: Needs assistance Equipment used: Rolling walker (2 wheels) Transfers: Sit to/from Stand Sit to Stand: Min assist           General transfer comment: cues for LE management and use of UEs to self assist.  Physical assist to bring wt up and fwd and to balance in initial standing with RW     Ambulation/Gait Ambulation/Gait assistance: Min assist Gait Distance (Feet): 33 Feet (and 15' twice to/from bathroom) Assistive device: Rolling walker (2 wheels) Gait Pattern/deviations: Step-to pattern, Decreased step length - left, Decreased step length - right, Shuffle, Trunk flexed Gait velocity: decr     General Gait Details: cues for sequence, posture and position from RW; distance ltd by increasing c/o dizziness - BP 78/55   Stairs             Wheelchair Mobility     Tilt Bed    Modified Rankin (Stroke Patients Only)       Balance Overall balance assessment: Needs assistance Sitting-balance support: Feet supported, No upper extremity supported Sitting balance-Leahy Scale: Good     Standing balance support: Single extremity supported Standing balance-Leahy Scale: Poor                              Cognition Arousal: Alert Behavior During Therapy: WFL for tasks assessed/performed, Flat affect Overall Cognitive Status: Within Functional Limits for tasks assessed                                          Exercises Total Joint Exercises Ankle Circles/Pumps: AROM, Both, 15 reps, Supine Quad Sets: AROM, Both, 10 reps, Supine Heel Slides: AAROM, Left, 20 reps, Supine Hip ABduction/ADduction: AAROM, Left, 15 reps, Supine    General Comments  Pertinent Vitals/Pain Pain Assessment Pain Assessment: 0-10 Pain Score: 5  Pain Location: R hip/thigh Pain Descriptors / Indicators: Aching, Sore Pain Intervention(s): Limited activity within patient's tolerance, Monitored during session, Patient requesting pain meds-RN notified, RN gave pain meds during session, Ice applied (Pt accepting Tylenol only by choice)    Home Living                          Prior Function            PT Goals (current goals can now be found in the care plan section) Acute Rehab PT Goals Patient Stated Goal: REgain IND PT Goal  Formulation: With patient Time For Goal Achievement: 03/20/23 Potential to Achieve Goals: Fair Progress towards PT goals: Progressing toward goals    Frequency    Min 1X/week      PT Plan      Co-evaluation              AM-PAC PT "6 Clicks" Mobility   Outcome Measure  Help needed turning from your back to your side while in a flat bed without using bedrails?: A Lot Help needed moving from lying on your back to sitting on the side of a flat bed without using bedrails?: A Lot Help needed moving to and from a bed to a chair (including a wheelchair)?: A Lot Help needed standing up from a chair using your arms (e.g., wheelchair or bedside chair)?: A Little Help needed to walk in hospital room?: A Lot Help needed climbing 3-5 steps with a railing? : A Lot 6 Click Score: 13    End of Session Equipment Utilized During Treatment: Gait belt Activity Tolerance: Patient tolerated treatment well;Other (comment) Patient left: in bed;with call bell/phone within reach;with bed alarm set Nurse Communication: Mobility status PT Visit Diagnosis: Difficulty in walking, not elsewhere classified (R26.2)     Time: 1410-1450 PT Time Calculation (min) (ACUTE ONLY): 40 min  Charges:    $Gait Training: 23-37 mins $Therapeutic Exercise: 8-22 mins $Therapeutic Activity: 8-22 mins PT General Charges $$ ACUTE PT VISIT: 1 Visit                     Mauro Kaufmann PT Acute Rehabilitation Services Pager [phone] Office [phone]    [facility] 03/08/2023, 3:33 PM

## 2023-03-08 NOTE — Plan of Care (Signed)
  Problem: Education: Goal: Knowledge of General Education information will improve Description: Including pain rating scale, medication(s)/side effects and non-pharmacologic comfort measures Outcome: Adequate for Discharge   Problem: Health Behavior/Discharge Planning: Goal: Ability to manage health-related needs will improve Outcome: Progressing   Problem: Clinical Measurements: Goal: Ability to maintain clinical measurements within normal limits will improve Outcome: Progressing Goal: Will remain free from infection Outcome: Progressing Goal: Diagnostic test results will improve Outcome: Adequate for Discharge Goal: Respiratory complications will improve Outcome: Progressing Goal: Cardiovascular complication will be avoided Outcome: Progressing   Problem: Activity: Goal: Risk for activity intolerance will decrease Outcome: Progressing   Problem: Nutrition: Goal: Adequate nutrition will be maintained Outcome: Adequate for Discharge   Problem: Coping: Goal: Level of anxiety will decrease Outcome: Progressing   Problem: Elimination: Goal: Will not experience complications related to bowel motility Outcome: Progressing Goal: Will not experience complications related to urinary retention Outcome: Completed/Met   Problem: Pain Management: Goal: General experience of comfort will improve Outcome: Progressing   Problem: Safety: Goal: Ability to remain free from injury will improve Outcome: Progressing   Problem: Skin Integrity: Goal: Risk for impaired skin integrity will decrease Outcome: Adequate for Discharge   Problem: Activity: Goal: Ability to ambulate and perform ADLs will improve Outcome: Adequate for Discharge   Problem: Clinical Measurements: Goal: Postoperative complications will be avoided or minimized Outcome: Progressing   Problem: Self-Concept: Goal: Ability to maintain and perform role responsibilities to the fullest extent possible will  improve Outcome: Progressing   Problem: Pain Management: Goal: Pain level will decrease Outcome: Adequate for Discharge   Problem: Education: Goal: Knowledge of the prescribed therapeutic regimen will improve Outcome: Progressing Goal: Understanding of discharge needs will improve Outcome: Progressing Goal: Individualized Educational Video(s) Outcome: Completed/Met   Problem: Activity: Goal: Ability to avoid complications of mobility impairment will improve Outcome: Progressing Goal: Ability to tolerate increased activity will improve Outcome: Adequate for Discharge   Problem: Clinical Measurements: Goal: Postoperative complications will be avoided or minimized Outcome: Progressing   Problem: Pain Management: Goal: Pain level will decrease with appropriate interventions Outcome: Adequate for Discharge   Problem: Skin Integrity: Goal: Will show signs of wound healing Outcome: Adequate for Discharge

## 2023-03-08 NOTE — Plan of Care (Signed)
  Problem: Education: Goal: Knowledge of General Education information will improve Description: Including pain rating scale, medication(s)/side effects and non-pharmacologic comfort measures Outcome: Progressing   Problem: Activity: Goal: Risk for activity intolerance will decrease Outcome: Progressing   Problem: Pain Management: Goal: General experience of comfort will improve Outcome: Progressing

## 2023-03-08 NOTE — TOC Progression Note (Signed)
Transition of Care [facility]) - Progression Note    Patient Details  Name: Theresa [name] MRN: [id number] Date of Birth: Sep 27, 1931  Transition of Care [facility]) CM/SW Contact  Howell Rucks, RN Phone Number: 03/08/2023, 3:19 PM  Clinical Narrative:   Met with pt at bedside to follow up for dc planning,  PT recommendation for short term rehab, pt ILF resident at [facility], no short term rehab beds available at [facility] at this time, pt presented with option of either returning to [facility] as private pay OR going to another SNF under her insurance,  patient requested to discuss further with her spouse and their elder law liaison.  Patient reports she has not been unable to speak with her spouse or their elder law liaison. NCM made call to pt's spouse at phone numbers on file, [phone] - no vm set up, [phone] is a non working number. TOC will continue to follow.   Expected Discharge Plan: Skilled Nursing Facility Barriers to Discharge: Continued Medical Work up, English as a second language teacher  Expected Discharge Plan and Services In-house Referral: Clinical Social Work   Post Acute Care Choice: Skilled Nursing Facility Living arrangements for the past 2 months: Marketing executive ([facility])                                       Social Determinants of Health (SDOH) Interventions SDOH Screenings   Food Insecurity: No Food Insecurity (03/02/2023)  Housing: Low Risk  (05/23/2022)   Received from [facility]  Transportation Needs: No Transportation Needs (03/02/2023)  Utilities: Not At Risk (03/02/2023)  Alcohol Screen: Low Risk  (12/12/2020)  Depression (PHQ2-9): Low Risk  (03/02/2023)  Financial Resource Strain: Low Risk  (03/02/2023)  Physical Activity: Insufficiently Active (03/02/2023)  Social Connections: Moderately Isolated (03/02/2023)  Stress: No Stress Concern Present (03/02/2023)  Tobacco Use: Medium Risk (03/05/2023)   Health Literacy: Adequate Health Literacy (03/02/2023)    Readmission Risk Interventions     No data to display

## 2023-03-08 NOTE — Progress Notes (Signed)
Physical Therapy Treatment Patient Details Name: Theresa [name] MRN: [id number] DOB: Aug 01, 1931 Today's Date: 03/08/2023   History of Present Illness Pt s/p fall with R hip fx and now s/p R hemi-arthroplasty by anterior direct approach.  Pt with hx of MVP, CVA, a-fib and lumar fusion    PT Comments  Pt continues cooperative and progressing with mobility but continues to require assist for all tasks and limited with OOB activity by orthostatic hypotension - BP sitting 120/72 and with walking 78/55 - pt reports only fatigue with ambulation but admits to mild dizziness after sitting in chair.    If plan is discharge home, recommend the following: A lot of help with walking and/or transfers;A little help with bathing/dressing/bathroom;Assistance with cooking/housework;Assist for transportation;Help with stairs or ramp for entrance   Can travel by private vehicle     No  Equipment Recommendations  None recommended by PT    Recommendations for Other Services       Precautions / Restrictions Precautions Precautions: Fall Precaution Comments: orthostatic Restrictions Weight Bearing Restrictions Per Provider Order: No Other Position/Activity Restrictions: WBAT     Mobility  Bed Mobility Overal bed mobility: Needs Assistance Bed Mobility: Supine to Sit     Supine to sit: Min assist, Mod assist     General bed mobility comments: Increased time with cues for sequence and use of R LE to self assist.  Physical assist to manage L LE and to complete rotation to EOB sitting with use of bed pad    Transfers Overall transfer level: Needs assistance Equipment used: Rolling walker (2 wheels) Transfers: Sit to/from Stand Sit to Stand: Min assist           General transfer comment: cues for LE management and use of UEs to self assist.  Physical assist to bring wt up and fwd and to balance in initial standing with RW    Ambulation/Gait Ambulation/Gait assistance: Min assist Gait  Distance (Feet): 42 Feet Assistive device: Rolling walker (2 wheels) Gait Pattern/deviations: Step-to pattern, Decreased step length - left, Decreased step length - right, Shuffle, Trunk flexed Gait velocity: decr     General Gait Details: cues for sequence, posture and position from RW; distance ltd by increasing c/o dizziness - BP 78/55   Stairs             Wheelchair Mobility     Tilt Bed    Modified Rankin (Stroke Patients Only)       Balance Overall balance assessment: Needs assistance Sitting-balance support: Feet supported, No upper extremity supported Sitting balance-Leahy Scale: Fair     Standing balance support: Bilateral upper extremity supported Standing balance-Leahy Scale: Poor                              Cognition Arousal: Alert Behavior During Therapy: WFL for tasks assessed/performed, Flat affect Overall Cognitive Status: Within Functional Limits for tasks assessed                                          Exercises Total Joint Exercises Ankle Circles/Pumps: AROM, Both, 15 reps, Supine Quad Sets: AROM, Both, 10 reps, Supine Heel Slides: AAROM, Left, 20 reps, Supine Hip ABduction/ADduction: AAROM, Left, 15 reps, Supine    General Comments        Pertinent Vitals/Pain Pain Assessment Pain Assessment: 0-10  Pain Score: 4  Pain Location: R hip/thigh Pain Descriptors / Indicators: Aching, Sore Pain Intervention(s): Limited activity within patient's tolerance, Monitored during session, Ice applied    Home Living                          Prior Function            PT Goals (current goals can now be found in the care plan section) Acute Rehab PT Goals Patient Stated Goal: REgain IND PT Goal Formulation: With patient Time For Goal Achievement: 03/20/23 Potential to Achieve Goals: Fair Progress towards PT goals: Progressing toward goals    Frequency    Min 1X/week      PT Plan       Co-evaluation              AM-PAC PT "6 Clicks" Mobility   Outcome Measure  Help needed turning from your back to your side while in a flat bed without using bedrails?: A Lot Help needed moving from lying on your back to sitting on the side of a flat bed without using bedrails?: A Lot Help needed moving to and from a bed to a chair (including a wheelchair)?: A Lot Help needed standing up from a chair using your arms (e.g., wheelchair or bedside chair)?: A Little Help needed to walk in hospital room?: A Lot Help needed climbing 3-5 steps with a railing? : A Lot 6 Click Score: 13    End of Session Equipment Utilized During Treatment: Gait belt Activity Tolerance: Patient tolerated treatment well;Other (comment) (orthostatic) Patient left: in chair;with call bell/phone within reach;with chair alarm set Nurse Communication: Mobility status PT Visit Diagnosis: Difficulty in walking, not elsewhere classified (R26.2)     Time: 1610-9604 PT Time Calculation (min) (ACUTE ONLY): 30 min  Charges:    $Gait Training: 8-22 mins $Therapeutic Exercise: 8-22 mins PT General Charges $$ ACUTE PT VISIT: 1 Visit                     Mauro Kaufmann PT Acute Rehabilitation Services Pager [phone] Office [phone]    Theresa [name] 03/08/2023, 1:40 PM

## 2023-03-08 NOTE — Plan of Care (Signed)
  Problem: Education: Goal: Knowledge of General Education information will improve Description: Including pain rating scale, medication(s)/side effects and non-pharmacologic comfort measures Outcome: Adequate for Discharge   Problem: Health Behavior/Discharge Planning: Goal: Ability to manage health-related needs will improve Outcome: Progressing   Problem: Clinical Measurements: Goal: Ability to maintain clinical measurements within normal limits will improve Outcome: Adequate for Discharge Goal: Will remain free from infection Outcome: Progressing Goal: Diagnostic test results will improve Outcome: Adequate for Discharge Goal: Respiratory complications will improve Outcome: Adequate for Discharge Goal: Cardiovascular complication will be avoided Outcome: Progressing   Problem: Activity: Goal: Risk for activity intolerance will decrease Outcome: Adequate for Discharge   Problem: Nutrition: Goal: Adequate nutrition will be maintained Outcome: Completed/Met   Problem: Coping: Goal: Level of anxiety will decrease Outcome: Progressing   Problem: Elimination: Goal: Will not experience complications related to bowel motility Outcome: Completed/Met   Problem: Pain Management: Goal: General experience of comfort will improve Outcome: Progressing   Problem: Safety: Goal: Ability to remain free from injury will improve Outcome: Progressing   Problem: Skin Integrity: Goal: Risk for impaired skin integrity will decrease Outcome: Progressing   Problem: Clinical Measurements: Goal: Postoperative complications will be avoided or minimized Outcome: Adequate for Discharge   Problem: Self-Concept: Goal: Ability to maintain and perform role responsibilities to the fullest extent possible will improve Outcome: Adequate for Discharge   Problem: Pain Management: Goal: Pain level will decrease Outcome: Progressing   Problem: Education: Goal: Knowledge of the prescribed  therapeutic regimen will improve Outcome: Adequate for Discharge Goal: Understanding of discharge needs will improve Outcome: Progressing   Problem: Activity: Goal: Ability to avoid complications of mobility impairment will improve Outcome: Adequate for Discharge Goal: Ability to tolerate increased activity will improve Outcome: Adequate for Discharge   Problem: Clinical Measurements: Goal: Postoperative complications will be avoided or minimized Outcome: Progressing   Problem: Pain Management: Goal: Pain level will decrease with appropriate interventions Outcome: Adequate for Discharge   Problem: Skin Integrity: Goal: Will show signs of wound healing Outcome: Progressing

## 2023-03-08 NOTE — Progress Notes (Signed)
PROGRESS NOTE  Briany Orrico EPP:[id number] DOB: [birth year]   PCP: Shelva Majestic, MD  [facility].  Uses rolling walker and cane at baseline.  DOA: 03/03/2023 LOS: 5  Chief complaints Chief Complaint  Patient presents with   Hip Pain     Brief Narrative / Interim history: 89 year old F with PMH of left MCA CVA, A-fib off Xarelto for 2 weeks, MVR, HTN, impaired fusion from macular degeneration and hypothyroidism presented with right hip pain after she stumbled on a stool and fell on her right side, and admitted with acute right femoral neck fracture with mild impaction.  No prodromes, head trauma or LOC.  Orthopedic surgery consulted, and she underwent right hip hemiarthroplasty by Dr. Linna Caprice on 12/19.  Postoperative course significant for orthostatic hypotension and delirium.  Improving.   Subjective: Seen and examined earlier this morning.  No major events overnight of this morning.  Confusion and delirium seems to have resolved.  Orthostatic with symptoms this morning.  She is eager to go home.  Objective: Vitals:   03/07/23 0832 03/07/23 1356 03/07/23 2149 03/08/23 0535  BP: 135/85 108/63 118/68 (!) 141/90  Pulse: 63 88 82 83  Resp: 18 15 15 16   Temp: 97.7 F (36.5 C) 97.7 F (36.5 C) 98 F (36.7 C) 97.7 F (36.5 C)  TempSrc: Oral  Oral Oral  SpO2: 93% (!) 89% 93% 93%  Weight:      Height:        Examination:  GENERAL: No apparent distress.  Nontoxic.  Sitting on bedside chair. HEENT: MMM.  Vision and hearing grossly intact.  NECK: Supple.  No apparent JVD.  RESP:  No IWOB.  Fair aeration bilaterally. CVS: Irregular rhythm.  Normal rate.  Heart sounds normal.  ABD/GI/GU: BS+. Abd soft, NTND.  MSK/EXT: No apparent deformity.  No edema.  Surgical dressing DCI. SKIN: Surgical dressing DCI. NEURO: Awake, alert and oriented appropriately.  No apparent focal neuro deficit. PSYCH: Calm. Normal affect.   Procedures:  12/19-right hemiarthroplasty  by Dr. Linna Caprice  Microbiology summarized: MRSA PCR screen nonreactive  Assessment and plan: Accidental fall: Stumbled on a stool and fell on the right side.  Did not strike head.  No prodrome or LOC. Acute closed right femoral neck fracture due to accidental fall -Independently ambulates using walker and cane at baseline.  Vitamin D 55.43. -S/p right hemiarthroplasty by Dr. Linna Caprice on 12/19 -Pain control with scheduled Tylenol and other as needed -PT/OT eval   Persistent A-fib: Rate controlled.  She was off Xarelto for 2 weeks -Decrease metoprolol to 25 mg twice daily given orthostatic hypotension.  Received 50 mg earlier this morning. -Continue Xarelto -Optimize electrolytes  Orthostatic hypotension/history of hypertension: Per RN, SBP drops to 80s when she got up with therapy again. -Decrease metoprolol to 25 mg twice daily.  Took 50 mg earlier.  She is on 100 mg twice daily at home -IV NS 500 cc x 1. -OOB/PT/OT  Confusion/agitation: Likely delirium.  Seems to have resolved. -Adjusted pain meds with scheduled Tylenol and others as needed -P.o. Seroquel 25 mg twice daily as needed -Reorientation and delirium precaution  Macular degeneration: Increased nursing assistance as needed for transfers -Fall precaution -PT/OT   History of mitral valve repair: No acute issues suspected   Hypothyroidism: TSH normal. -Continue levothyroxine  History of ischemic left MCA CVA: Stable -Continue home meds.  Depression: Stable -Continue lexapro  Hyponatremia: Resolved.   AKI: Cr went up from 0.6-0.93.  Resolved. -Continue monitoring -  Avoid hypotension.  Mild leukocytosis: Likely demargination.  Resolved.  Increased nutrient needs Body mass index is 20.24 kg/m. Nutrition Problem: Increased nutrient needs Etiology: hip fracture Signs/Symptoms: estimated needs Interventions: Ensure Enlive (each supplement provides 350kcal and 20 grams of protein), MVI   DVT prophylaxis:   SCDs Start: 03/05/23 2239 Rivaroxaban (XARELTO) tablet 15 mg  Code Status: Full code Family Communication: Attempted to call patient's husband for update but his phone does not seem to be in service Level of care: Med-Surg Status is: Inpatient Remains inpatient appropriate because: Due to right femoral neck fracture   Final disposition: SNF? Consultants:  Orthopedic surgery  35 minutes with more than 50% spent in reviewing records, counseling patient/family and coordinating care.   Sch Meds:  Scheduled Meds:  acetaminophen  650 mg Oral Q6H WA   calcium-vitamin D  1 tablet Oral BID WC   chlorhexidine  15 mL Mouth/Throat Once   docusate sodium  100 mg Oral BID   escitalopram  10 mg Oral Daily   feeding supplement  237 mL Oral BID BM   levothyroxine  75 mcg Oral Q0600   metoprolol tartrate  25 mg Oral BID   multivitamin with minerals  1 tablet Oral Daily   rivaroxaban  15 mg Oral Q supper   rosuvastatin  10 mg Oral Daily   senna  1 tablet Oral BID   Continuous Infusions:   PRN Meds:.menthol-cetylpyridinium **OR** phenol, methocarbamol **OR** [DISCONTINUED] methocarbamol (ROBAXIN) injection, metoCLOPramide **OR** metoCLOPramide (REGLAN) injection, ondansetron **OR** ondansetron (ZOFRAN) IV, oxyCODONE, polyethylene glycol, QUEtiapine  Antimicrobials: Anti-infectives (From admission, onward)    Start     Dose/Rate Route Frequency Ordered Stop   03/06/23 0100  ceFAZolin (ANCEF) IVPB 2g/100 mL premix        2 g 200 mL/hr over 30 Minutes Intravenous Every 6 hours 03/05/23 2238 03/06/23 0710   03/05/23 1545  ceFAZolin (ANCEF) IVPB 2g/100 mL premix        2 g 200 mL/hr over 30 Minutes Intravenous On call to O.R. 03/05/23 1444 03/05/23 1919        I have personally reviewed the following labs and images: CBC: Recent Labs  Lab 03/03/23 2124 03/05/23 0748 03/06/23 0337 03/07/23 0352 03/08/23 0352  WBC 10.9* 9.2 10.9* 13.3* 9.5  NEUTROABS 9.0*  --   --   --   --   HGB  13.4 13.1 12.9 11.7* 11.4*  HCT 41.0 40.1 39.1 36.3 34.7*  MCV 102.2* 100.5* 101.6* 100.6* 99.7  PLT 150 151 147* 173 167   BMP &GFR Recent Labs  Lab 03/03/23 2200 03/05/23 0748 03/06/23 0337 03/07/23 0352 03/08/23 0352  NA 136 136 130* 133* 139  K 3.5 4.0 4.0 4.6 4.4  CL 103 102 97* 98 103  CO2 25 27 26 29 30   GLUCOSE 106* 133* 154* 131* 95  BUN 34* 22 25* 34* 27*  CREATININE 0.70 0.60 0.93 0.89 0.64  CALCIUM 8.6* 8.2* 7.7* 8.5* 8.6*  MG  --  2.1  --  2.1 2.2  PHOS  --  1.9* 3.1 1.6* 3.1   Estimated Creatinine Clearance: 34.2 mL/min (by C-G formula based on SCr of 0.64 mg/dL). Liver & Pancreas: Recent Labs  Lab 03/03/23 2200 03/05/23 0748 03/06/23 0337 03/07/23 0352 03/08/23 0352  AST 29  --   --   --   --   ALT 42  --   --   --   --   ALKPHOS 81  --   --   --   --  BILITOT 0.7  --   --   --   --   PROT 5.9*  --   --   --   --   ALBUMIN 3.6 3.0* 3.0* 2.8* 2.8*   No results for input(s): "LIPASE", "AMYLASE" in the last 168 hours. No results for input(s): "AMMONIA" in the last 168 hours. Diabetic: Recent Labs    03/06/23 0356  HGBA1C 5.5   No results for input(s): "GLUCAP" in the last 168 hours. Cardiac Enzymes: No results for input(s): "CKTOTAL", "CKMB", "CKMBINDEX", "TROPONINI" in the last 168 hours. No results for input(s): "PROBNP" in the last 8760 hours. Coagulation Profile: Recent Labs  Lab 03/03/23 2200  INR 1.1   Thyroid Function Tests: No results for input(s): "TSH", "T4TOTAL", "FREET4", "T3FREE", "THYROIDAB" in the last 72 hours.  Lipid Profile: No results for input(s): "CHOL", "HDL", "LDLCALC", "TRIG", "CHOLHDL", "LDLDIRECT" in the last 72 hours. Anemia Panel: No results for input(s): "VITAMINB12", "FOLATE", "FERRITIN", "TIBC", "IRON", "RETICCTPCT" in the last 72 hours. Urine analysis:    Component Value Date/Time   COLORURINE YELLOW 03/21/2013 1242   APPEARANCEUR CLEAR 03/21/2013 1242   LABSPEC 1.007 03/21/2013 1242   PHURINE 6.5  03/21/2013 1242   GLUCOSEU NEGATIVE 03/21/2013 1242   HGBUR NEGATIVE 03/21/2013 1242   HGBUR negative 12/19/2009 0945   BILIRUBINUR SMALL (A) 03/21/2013 1242   BILIRUBINUR 2+ 01/13/2013 1027   KETONESUR NEGATIVE 03/21/2013 1242   PROTEINUR NEGATIVE 03/21/2013 1242   UROBILINOGEN 0.2 03/21/2013 1242   NITRITE NEGATIVE 03/21/2013 1242   LEUKOCYTESUR NEGATIVE 03/21/2013 1242   Sepsis Labs: Invalid input(s): "PROCALCITONIN", "LACTICIDVEN"  Microbiology: Recent Results (from the past 240 hours)  Surgical pcr screen     Status: None   Collection Time: 03/04/23  9:32 PM   Specimen: Nasal Mucosa; Nasal Swab  Result Value Ref Range Status   MRSA, PCR NEGATIVE NEGATIVE Final   Staphylococcus aureus NEGATIVE NEGATIVE Final    Comment: (NOTE) The Xpert SA Assay (FDA approved for NASAL specimens in patients 58 years of age and older), is one component of a comprehensive surveillance program. It is not intended to diagnose infection nor to guide or monitor treatment. Performed at [facility], [address], [place], [place] [address]     Radiology Studies: No results found.     Shaleta Ruacho T. Ariann Khaimov [facility]  If 7PM-7AM, please contact night-coverage [email or web address] 03/08/2023, 12:53 PM

## 2023-03-09 ENCOUNTER — Other Ambulatory Visit (HOSPITAL_COMMUNITY): Payer: Self-pay

## 2023-03-09 ENCOUNTER — Encounter (HOSPITAL_COMMUNITY): Payer: Self-pay | Admitting: Pharmacy Technician

## 2023-03-09 ENCOUNTER — Telehealth (HOSPITAL_COMMUNITY): Payer: Self-pay | Admitting: Pharmacy Technician

## 2023-03-09 DIAGNOSIS — Y92009 Unspecified place in unspecified non-institutional (private) residence as the place of occurrence of the external cause: Secondary | ICD-10-CM

## 2023-03-09 DIAGNOSIS — N183 Chronic kidney disease, stage 3 unspecified: Secondary | ICD-10-CM

## 2023-03-09 DIAGNOSIS — I482 Chronic atrial fibrillation, unspecified: Secondary | ICD-10-CM | POA: Diagnosis present

## 2023-03-09 DIAGNOSIS — W19XXXA Unspecified fall, initial encounter: Secondary | ICD-10-CM

## 2023-03-09 DIAGNOSIS — E038 Other specified hypothyroidism: Secondary | ICD-10-CM

## 2023-03-09 DIAGNOSIS — S72001A Fracture of unspecified part of neck of right femur, initial encounter for closed fracture: Secondary | ICD-10-CM | POA: Diagnosis not present

## 2023-03-09 DIAGNOSIS — Z9889 Other specified postprocedural states: Secondary | ICD-10-CM | POA: Diagnosis not present

## 2023-03-09 MED ORDER — RIVAROXABAN 15 MG PO TABS
15.0000 mg | ORAL_TABLET | Freq: Every day | ORAL | 3 refills | Status: DC
  Filled 2023-03-09: qty 30, 30d supply, fill #0

## 2023-03-09 MED ORDER — QUETIAPINE FUMARATE 25 MG PO TABS: 25.0000 mg | ORAL_TABLET | Freq: Two times a day (BID) | ORAL | 0 refills | Status: DC | PRN

## 2023-03-09 MED ORDER — POLYETHYLENE GLYCOL 3350 17 G PO PACK: 17.0000 g | PACK | Freq: Every day | ORAL | Status: AC | PRN

## 2023-03-09 MED ORDER — DOCUSATE SODIUM 100 MG PO CAPS: 100.0000 mg | ORAL_CAPSULE | Freq: Two times a day (BID) | ORAL | Status: AC

## 2023-03-09 MED ORDER — METHOCARBAMOL 500 MG PO TABS: 500.0000 mg | ORAL_TABLET | Freq: Three times a day (TID) | ORAL | Status: DC | PRN

## 2023-03-09 MED ORDER — METOPROLOL TARTRATE 25 MG PO TABS: 25.0000 mg | ORAL_TABLET | Freq: Two times a day (BID) | ORAL | Status: AC

## 2023-03-09 NOTE — Progress Notes (Addendum)
[facility]                                                                              Theresa [name], is a 89 y.o. female, DOB - 12/27/1931, GMW:[id number] Admit date - 03/03/2023    Outpatient Primary MD for the patient is Durene Cal, Aldine Contes, MD  LOS - 6  days  Chief Complaint  Patient presents with   Hip Pain       Brief summary   Patient is a 89 year old female with left MCA CVA, A-fib off Xarelto for 2 weeks, MVR, HTN, impaired fusion from macular degeneration and hypothyroidism presented with right hip pain after she stumbled on a stool and fell on her right side, and admitted with acute right femoral neck fracture with mild impaction.  No prodromes, head trauma or LOC.  Orthopedic surgery consulted, and she underwent right hip hemiarthroplasty by Dr. Linna Caprice on 12/19.   Postoperative course significant for orthostatic hypotension and delirium.  Improving.    Assessment & Plan    Principal Problem: Mechanical fall with fracture of femoral neck, right, closed, displaced (HCC) -No syncopal episode, prior to the fall and fracture, patient was at [facility]F, ambulating with walker and cane at baseline -Orthopedics consulted, underwent right hemiarthroplasty by Dr. Linna Caprice on 12/19 -Per orthopedics, weightbearing as tolerated with walker, -Xarelto resumed (however patient had stopped taking Xarelto prior to admission due to cost) -PT recommending SNF  Active Problems: Chronic atrial fibrillation (HCC) -Rate controlled, continue metoprolol -Currently on Xarelto, will continue   Orthostatic hypotension/history of hypertension:  -BP currently stable, continue metoprolol 25 mg twice daily (was on 100 mg twice daily at home) -PT OT, received IV fluids   Confusion/agitation: Likely delirium.  Seems to have resolved. -Adjusted pain meds with scheduled Tylenol and others as needed -P.o. Seroquel 25 mg twice daily as needed -Reorientation and delirium  precaution -Currently fairly alert and oriented, anxious about her husband back at the facility   Macular degeneration:  -Increased nursing assistance as needed for transfers -Fall precaution -PT/OT   History of mitral valve repair:  - No acute issues suspected   Hypothyroidism:  -TSH normal. -Continue levothyroxine   History of ischemic left MCA CVA: Stable -Continue home meds.   Depression:  -Stable -Continue lexapro   Hyponatremia:  -Resolved.   Mild AKI superimposed on CKD stage IIIa:  - Cr went up from 0.6-0.93.  Resolved. -Avoid hypotension.   Mild leukocytosis:  -Likely demargination.  Resolved.  Nutrition Problem: Increased nutrient needs Etiology: hip fracture Signs/Symptoms: estimated needs Interventions: Ensure Enlive (each supplement provides 350kcal and 20 grams of protein), MVI  Estimated body mass index is 20.24 kg/m as calculated from the following:   Height as of this encounter: 5' (1.524 m).   Weight as of this encounter: 47 kg.  Code Status: Full CODE STATUS DVT Prophylaxis:  SCDs Start: 03/05/23 2239 Rivaroxaban (XARELTO) tablet 15 mg   Level of Care: Level of care: Med-Surg Family Communication: Updated patient Disposition Plan:      Remains inpatient appropriate: Awaiting SNF   Procedures:  03/05/2023  right hip hemiarthroplasty, anterior approach.  Consultants:   Orthopedics  Antimicrobials:   Anti-infectives (From admission, onward)    Start     Dose/Rate Route Frequency Ordered Stop   03/06/23 0100  ceFAZolin (ANCEF) IVPB 2g/100 mL premix        2 g 200 mL/hr over 30 Minutes Intravenous Every 6 hours 03/05/23 2238 03/06/23 0710   03/05/23 1545  ceFAZolin (ANCEF) IVPB 2g/100 mL premix        2 g 200 mL/hr over 30 Minutes Intravenous On call to O.R. 03/05/23 1444 03/05/23 1919          Medications  acetaminophen  650 mg Oral Q6H WA   calcium-vitamin D  1 tablet Oral BID WC   chlorhexidine  15 mL Mouth/Throat Once    docusate sodium  100 mg Oral BID   escitalopram  10 mg Oral Daily   feeding supplement  237 mL Oral BID BM   levothyroxine  75 mcg Oral Q0600   metoprolol tartrate  25 mg Oral BID   multivitamin with minerals  1 tablet Oral Daily   rivaroxaban  15 mg Oral Q supper   rosuvastatin  10 mg Oral Daily   senna  1 tablet Oral BID      Subjective:   Theresa [name] was seen and examined today.  No acute complaints.  Somewhat anxious, wants to know the wellbeing of her husband back at the facility.  Patient denies dizziness, chest pain, shortness of breath, abdominal pain, N/V/D/C.    Objective:   Vitals:   03/08/23 0535 03/08/23 1351 03/08/23 2207 03/09/23 0612  BP: (!) 141/90 113/68 129/84 136/84  Pulse: 83 84 84 94  Resp: 16 15 16 16   Temp: 97.7 F (36.5 C) 98.8 F (37.1 C) 98.1 F (36.7 C) 98.1 F (36.7 C)  TempSrc: Oral  Oral Oral  SpO2: 93% 94% 93% 93%  Weight:      Height:        Intake/Output Summary (Last 24 hours) at 03/09/2023 1334 Last data filed at 03/09/2023 1300 Gross per 24 hour  Intake 480 ml  Output --  Net 480 ml     Wt Readings from Last 3 Encounters:  03/05/23 47 kg  03/02/23 45.3 kg  09/25/22 41.7 kg     Exam General: Alert and oriented x 3, NAD Cardiovascular: S1 S2 auscultated,  RRR Respiratory: Clear to auscultation bilaterally Gastrointestinal: Soft, nontender, nondistended, + bowel sounds Ext: no pedal edema bilaterally Neuro: no new deficits Psych: slightly anxious today    Data Reviewed:  I have personally reviewed following labs    CBC Lab Results  Component Value Date   WBC 9.5 03/08/2023   RBC 3.48 (L) 03/08/2023   HGB 11.4 (L) 03/08/2023   HCT 34.7 (L) 03/08/2023   MCV 99.7 03/08/2023   MCH 32.8 03/08/2023   PLT 167 03/08/2023   MCHC 32.9 03/08/2023   RDW 13.2 03/08/2023   LYMPHSABS 1.1 03/03/2023   MONOABS 0.6 03/03/2023   EOSABS 0.1 03/03/2023   BASOSABS 0.1 03/03/2023     Last metabolic panel Lab Results   Component Value Date   NA 139 03/08/2023   K 4.4 03/08/2023   CL 103 03/08/2023   CO2 30 03/08/2023   BUN 27 (H) 03/08/2023   CREATININE 0.64 03/08/2023   GLUCOSE 95 03/08/2023   GFRNONAA >60 03/08/2023   GFRAA 76 (L) 03/21/2013   CALCIUM 8.6 (L) 03/08/2023   PHOS 3.1 03/08/2023   PROT 5.9 (L) 03/03/2023   ALBUMIN  2.8 (L) 03/08/2023   BILITOT 0.7 03/03/2023   ALKPHOS 81 03/03/2023   AST 29 03/03/2023   ALT 42 03/03/2023   ANIONGAP 6 03/08/2023    CBG (last 3)  No results for input(s): "GLUCAP" in the last 72 hours.    Coagulation Profile: Recent Labs  Lab 03/03/23 2200  INR 1.1     Radiology Studies: I have personally reviewed the imaging studies  No results found.     Thad Ranger M.D. [facility] 03/09/2023, 1:34 PM  Available via Epic secure chat 7am-7pm After 7 pm, please refer to night coverage provider listed on amion.

## 2023-03-09 NOTE — Plan of Care (Signed)
  Problem: Increased Nutrient Needs (NI-5.1) Goal: Food and/or nutrient delivery Description: Individualized approach for food/nutrient provision. Outcome: Completed/Met   Problem: Acute Rehab PT Goals(only PT should resolve) Goal: Pt Will Go Supine/Side To Sit Outcome: Completed/Met Goal: Pt Will Go Sit To Supine/Side Outcome: Completed/Met Goal: Patient Will Transfer Sit To/From Stand Outcome: Completed/Met Goal: Pt Will Ambulate Outcome: Completed/Met   Problem: Acute Rehab OT Goals (only OT should resolve) Goal: Pt. Will Perform Grooming Outcome: Completed/Met Goal: Pt. Will Perform Upper Body Bathing Outcome: Completed/Met Goal: Pt. Will Perform Lower Body Bathing Outcome: Completed/Met Goal: Pt. Will Perform Upper Body Dressing Outcome: Completed/Met Goal: Pt. Will Transfer To Toilet Outcome: Completed/Met Goal: Pt. Will Perform Toileting-Clothing Manipulation Outcome: Completed/Met

## 2023-03-09 NOTE — Telephone Encounter (Addendum)
Patient Product/process development scientist completed.    The patient is insured through [facility]. Patient has Medicare and is not eligible for a copay card, but may be able to apply for patient assistance, if available.    Ran test claim for Xarelto 15 mg and the current 30 day co-pay is $118.10 due to being in Coverage Gap (donut hole).  Ran test claim for Eliquis 2.5 mg and the current 30 day co-pay is $123.98 due to being in Coverage Gap (donut hole).  Ran test claim for dabigatran (Pradaxa) 75 mg and the current 30 day co-pay is $99.00  This test claim was processed through [employer]- copay amounts may vary at other pharmacies due to [employer], or as the patient moves through the different stages of their insurance plan.     Roland Earl, CPHT Pharmacy Technician III Certified Patient Advocate [facility] Patient Advocate Team Direct Number: [phone]  Fax: [phone]

## 2023-03-09 NOTE — Discharge Summary (Addendum)
Physician Discharge Summary   Patient: Theresa [name] MRN: [id number] DOB: 1931/09/14  Admit date:     03/03/2023  Discharge date: 03/09/23  Discharge Physician: Thad Ranger, MD    PCP: Shelva Majestic, MD   Recommendations at discharge:    Per orthopedics, weightbearing as tolerated with walker, Xarelto resumed (however patient had stopped taking Xarelto prior to admission due to cost) PT recommending SNF Outpatient follow-up with orthopedics in 2 weeks for suture removal   Discharge Diagnoses:    Fracture of femoral neck, right, closed (HCC)   Hypothyroidism   History of mitral valve repair   Osteopenia   Hx of ischemic left MCA stroke   Hypertension   CKD (chronic kidney disease), stage IIIa (HCC)   Macular degeneration Delirium, postop, resolved   Atrial fibrillation, chronic [facility])   Hospital Course:  Patient is a 89 year old female with left MCA CVA, A-fib off Xarelto for 2 weeks, MVR, HTN, impaired fusion from macular degeneration and hypothyroidism presented with right hip pain after she stumbled on a stool and fell on her right side, and admitted with acute right femoral neck fracture with mild impaction.  No prodromes, head trauma or LOC.  Orthopedic surgery consulted, and she underwent right hip hemiarthroplasty by Dr. Linna Caprice on 12/19.   Postoperative course significant for orthostatic hypotension and delirium.  Improving.   Assessment and Plan: Mechanical fall with fracture of femoral neck, right, closed, displaced (HCC) -No syncopal episode, prior to the fall and fracture, patient was at [facility]F, ambulating with walker and cane at baseline -Orthopedics consulted, underwent right hemiarthroplasty by Dr. Linna Caprice on 12/19 -Per orthopedics, weightbearing as tolerated with walker, -Xarelto resumed (however patient had stopped taking Xarelto prior to admission due to cost) -PT recommending SNF    Chronic atrial fibrillation (HCC) -Rate controlled, continue  metoprolol -Xarelto resumed   Orthostatic hypotension/history of hypertension:  -BP currently stable, continue metoprolol 25 mg twice daily (was on 100 mg twice daily at home) -PT OT, received IV fluids     Confusion/agitation: Likely delirium, postop. -  Seems to have resolved. -Continue Tylenol, use oxycodone prior to PT -Currently alert and oriented   Macular degeneration:  -Increased nursing assistance as needed for transfers -Fall precaution -PT/OT   History of mitral valve repair:  - No acute issues suspected   Hypothyroidism:  -TSH normal. -Continue levothyroxine   History of ischemic left MCA CVA: Stable -Continue home meds.   Depression:  -Stable -Continue lexapro   Hyponatremia:  -Resolved.   Mild AKI superimposed on CKD stage IIIa:  - Cr went up from 0.6-0.93.  Resolved. -Avoid hypotension.   Mild leukocytosis:  -Likely demargination.  Resolved.   Nutrition Problem: Increased nutrient needs Etiology: hip fracture Signs/Symptoms: estimated needs Interventions: Ensure Enlive (each supplement provides 350kcal and 20 grams of protein), MVI   Estimated body mass index is 20.24 kg/m as calculated from the following:   Height as of this encounter: 5' (1.524 m).   Weight as of this encounter: 47 kg.     Pain control - [employer] Controlled Substance Reporting System database was reviewed. and patient was instructed, not to drive, operate heavy machinery, perform activities at heights, swimming or participation in water activities or provide baby-sitting services while on Pain, Sleep and Anxiety Medications; until their outpatient Physician has advised to do so again. Also recommended to not to take more than prescribed Pain, Sleep and Anxiety Medications.  Consultants: Orthopedics Procedures performed: 03/05/2023  right hip hemiarthroplasty, anterior  approach.  Disposition: Skilled nursing facility Diet recommendation:  Discharge Diet Orders (From  admission, onward)     Start     Ordered   03/09/23 0000  Diet - low sodium heart healthy        03/09/23 1451            DISCHARGE MEDICATION: Allergies as of 03/09/2023       Reactions   Tape Other (See Comments)   SKIN TEARS AND BRUISES VERY EASILY   Rofecoxib Other (See Comments)   Vioxx- Elevated LFT's        Medication List     TAKE these medications    CITRACAL PETITES/VITAMIN D PO Take 1 tablet by mouth 2 (two) times daily.   docusate sodium 100 MG capsule Commonly known as: COLACE Take 1 capsule (100 mg total) by mouth 2 (two) times daily.   escitalopram 10 MG tablet Commonly known as: LEXAPRO Take 1 tablet (10 mg total) by mouth daily.   fluticasone 50 MCG/ACT nasal spray Commonly known as: FLONASE Place 2 sprays into both nostrils daily.   GINKOBA PO Take 1 capsule by mouth 2 (two) times daily after a meal.   glucosamine-chondroitin 500-400 MG tablet Take 1 tablet by mouth 2 (two) times daily.   HYDROcodone-acetaminophen 5-325 MG tablet Commonly known as: NORCO/VICODIN Take 1 tablet by mouth every 4 (four) hours as needed for up to 7 days for moderate pain (pain score 4-6) or severe pain (pain score 7-10).   levothyroxine 75 MCG tablet Commonly known as: SYNTHROID TAKE 1 TABLET BY MOUTH EVERY DAY BEFORE BREAKFAST What changed: See the new instructions.   loratadine 10 MG tablet Commonly known as: CLARITIN Take 1 tablet (10 mg total) by mouth daily.   methocarbamol 500 MG tablet Commonly known as: ROBAXIN Take 1 tablet (500 mg total) by mouth every 8 (eight) hours as needed for muscle spasms.   metoprolol tartrate 25 MG tablet Commonly known as: LOPRESSOR Take 1 tablet (25 mg total) by mouth 2 (two) times daily. What changed:  medication strength See the new instructions.   polyethylene glycol 17 g packet Commonly known as: MIRALAX / GLYCOLAX Take 17 g by mouth daily as needed for mild constipation.   PRESERVISION AREDS 2  PO Take 1 capsule by mouth 2 (two) times daily. Reported on 09/04/2015   Rivaroxaban 15 MG Tabs tablet Commonly known as: Xarelto Take 1 tablet (15 mg total) by mouth daily with supper.   rosuvastatin 10 MG tablet Commonly known as: CRESTOR TAKE 1 TABLET BY MOUTH DAILY. GENERIC EQUIVALENT FOR CRESTOR What changed: See the new instructions.   Simply Saline 0.9 % Aers Generic drug: Saline Place 2 each into the nose daily as needed. What changed:  how much to take how to take this when to take this reasons to take this   Tylenol 8 Hour Arthritis Pain 650 MG CR tablet Generic drug: acetaminophen Take 650 mg by mouth in the morning.   vitamin A 54627 UNIT capsule Take 10,000 Units by mouth daily.        Follow-up Information     Clois Dupes, [place]. Schedule an appointment as soon as possible for a visit in 2 week(s).   Specialty: Orthopedic Surgery Why: For suture removal Contact information: [address] [place] [address] [phone]         Shelva Majestic, MD. Schedule an appointment as soon as possible for a visit in 2 week(s).  Specialty: Family Medicine Why: for hospital follow-up Contact information: [address] [place] [address] [phone]                Discharge Exam: Filed Weights   03/04/23 0216 03/05/23 1519  Weight: 47 kg 47 kg   No acute complaints.  Wants to return back to the facility.  BP 138/74 (BP Location: Left Arm)   Pulse 99   Temp 97.8 F (36.6 C)   Resp 17   Ht 5' (1.524 m)   Wt 47 kg   LMP  (LMP Unknown)   SpO2 98%   BMI 20.24 kg/m   Physical Exam General: Alert and oriented x 3, NAD Cardiovascular: S1 S2 clear, RRR.  Respiratory: CTAB Gastrointestinal: Soft, nontender, nondistended, NBS Ext: no pedal edema bilaterally Neuro: no new deficits Psych: Normal affect    Condition at discharge: fair  The results of significant diagnostics from this hospitalization  (including imaging, microbiology, ancillary and laboratory) are listed below for reference.   Imaging Studies: DG HIP UNILAT WITH PELVIS 1V RIGHT Result Date: 03/05/2023 CLINICAL DATA:  Right hip arthroplasty, intraoperative examination EXAM: DG HIP (WITH OR WITHOUT PELVIS) 1V RIGHT COMPARISON:  03/03/2023 FINDINGS: Six fluoroscopic intraoperative radiographs of the reported right hip demonstrate, initially, a subcapital right femoral neck fracture with subsequent surgical changes right hip bipolar hemiarthroplasty. Final images demonstrate arthroplasty components overlie the expected position. No unexpected fracture or dislocation. Fluoroscopic images: 6 Fluoroscopic time: 0.1 minutes Fluoroscopic dose: 0.3803 mGy IMPRESSION: 1. Fluoroscopic guidance for right hip hemiarthroplasty. Electronically Signed   By: Helyn Numbers M.D.   On: 03/05/2023 23:59   DG C-Arm 1-60 Min-No Report Result Date: 03/05/2023 Fluoroscopy was utilized by the requesting physician.  No radiographic interpretation.   Chest Portable 1 View Result Date: 03/03/2023 CLINICAL DATA:  Fall EXAM: PORTABLE CHEST 1 VIEW COMPARISON:  Chest x-ray 01/27/2022 FINDINGS: Heart is enlarged. Prosthetic heart valve is present. The lungs are clear. There is no pleural effusion or pneumothorax. There are healed left lower rib fractures. IMPRESSION: Cardiomegaly. No acute cardiopulmonary process. Electronically Signed   By: Darliss Cheney M.D.   On: 03/03/2023 23:59   DG Hip Unilat W or Wo Pelvis 2-3 Views Right Result Date: 03/03/2023 CLINICAL DATA:  Fall, pain EXAM: DG HIP (WITH OR WITHOUT PELVIS) 2-3V RIGHT; RIGHT FEMUR 2 VIEWS COMPARISON:  02/18/2018 FINDINGS: Acute fracture of the right femoral neck with mild impaction. Fracture appears to be primarily subcapital in location. No evidence of fracture involvement of the intertrochanteric region. Hip joint intact without dislocation. Degenerative changes of the knee. Soft tissue swelling is  evident at the hip. IMPRESSION: Acute fracture of the right femoral neck with mild impaction. Electronically Signed   By: Duanne Guess D.O.   On: 03/03/2023 20:59   DG FEMUR, MIN 2 VIEWS RIGHT Result Date: 03/03/2023 CLINICAL DATA:  Fall, pain EXAM: DG HIP (WITH OR WITHOUT PELVIS) 2-3V RIGHT; RIGHT FEMUR 2 VIEWS COMPARISON:  02/18/2018 FINDINGS: Acute fracture of the right femoral neck with mild impaction. Fracture appears to be primarily subcapital in location. No evidence of fracture involvement of the intertrochanteric region. Hip joint intact without dislocation. Degenerative changes of the knee. Soft tissue swelling is evident at the hip. IMPRESSION: Acute fracture of the right femoral neck with mild impaction. Electronically Signed   By: Duanne Guess D.O.   On: 03/03/2023 20:59    Microbiology: Results for orders placed or performed during the hospital encounter of 03/03/23  Surgical  pcr screen     Status: None   Collection Time: 03/04/23  9:32 PM   Specimen: Nasal Mucosa; Nasal Swab  Result Value Ref Range Status   MRSA, PCR NEGATIVE NEGATIVE Final   Staphylococcus aureus NEGATIVE NEGATIVE Final    Comment: (NOTE) The Xpert SA Assay (FDA approved for NASAL specimens in patients 26 years of age and older), is one component of a comprehensive surveillance program. It is not intended to diagnose infection nor to guide or monitor treatment. Performed at [facility], [address], [place], [place] [address]     Labs: CBC: Recent Labs  Lab 03/03/23 2124 03/05/23 0748 03/06/23 0337 03/07/23 0352 03/08/23 0352  WBC 10.9* 9.2 10.9* 13.3* 9.5  NEUTROABS 9.0*  --   --   --   --   HGB 13.4 13.1 12.9 11.7* 11.4*  HCT 41.0 40.1 39.1 36.3 34.7*  MCV 102.2* 100.5* 101.6* 100.6* 99.7  PLT 150 151 147* 173 167   Basic Metabolic Panel: Recent Labs  Lab 03/03/23 2200 03/05/23 0748 03/06/23 0337 03/07/23 0352 03/08/23 0352  NA 136 136 130* 133* 139  K  3.5 4.0 4.0 4.6 4.4  CL 103 102 97* 98 103  CO2 25 27 26 29 30   GLUCOSE 106* 133* 154* 131* 95  BUN 34* 22 25* 34* 27*  CREATININE 0.70 0.60 0.93 0.89 0.64  CALCIUM 8.6* 8.2* 7.7* 8.5* 8.6*  MG  --  2.1  --  2.1 2.2  PHOS  --  1.9* 3.1 1.6* 3.1   Liver Function Tests: Recent Labs  Lab 03/03/23 2200 03/05/23 0748 03/06/23 0337 03/07/23 0352 03/08/23 0352  AST 29  --   --   --   --   ALT 42  --   --   --   --   ALKPHOS 81  --   --   --   --   BILITOT 0.7  --   --   --   --   PROT 5.9*  --   --   --   --   ALBUMIN 3.6 3.0* 3.0* 2.8* 2.8*   CBG: No results for input(s): "GLUCAP" in the last 168 hours.  Discharge time spent: greater than 30 minutes.  Signed: Thad Ranger, MD [facility] 03/09/2023

## 2023-03-09 NOTE — Plan of Care (Signed)
  Problem: Education: Goal: Knowledge of General Education information will improve Description Including pain rating scale, medication(s)/side effects and non-pharmacologic comfort measures Outcome: Progressing   Problem: Activity: Goal: Risk for activity intolerance will decrease Outcome: Progressing   Problem: Safety: Goal: Ability to remain free from injury will improve Outcome: Progressing

## 2023-03-09 NOTE — Progress Notes (Signed)
Pt to [facility] via [facility]. A&O, comfortable.  D/C packet given to EMS staff.

## 2023-03-09 NOTE — TOC Transition Note (Signed)
Transition of Care [facility]) - Discharge Note   Patient Details  Name: Theresa [name] MRN: [id number] Date of Birth: [birth year]  Transition of Care [facility]) CM/SW Contact:  Amada Jupiter, LCSW Phone Number: 03/09/2023, 3:34 PM   Clinical Narrative:     Met with pt and her POA, Theresa [name], today to review options for SNF and pt has chosen to go to SNF level at [facility] and aware of private pay cost.  She wants to remain close to her spouse there.  Medically cleared for dc today and facility confirms bed is ready.  [name] called at 1535.  RN to call report to [phone] ext. [phone].  No further TOC needs.  Final next level of care: Skilled Nursing Facility Barriers to Discharge: Barriers Resolved   Patient Goals and CMS Choice Patient states their goals for this hospitalization and ongoing recovery are:: return home following SNF rehab          Discharge Placement              Patient chooses bed at: [facility] Patient to be transferred to facility by: [name] Name of family member notified: POA Patient and family notified of of transfer: 03/09/23  Discharge Plan and Services Additional resources added to the After Visit Summary for   In-house Referral: Clinical Social Work   Post Acute Care Choice: Skilled Nursing Facility          DME Arranged: N/A DME Agency: NA                  Social Drivers of Health (SDOH) Interventions SDOH Screenings   Food Insecurity: No Food Insecurity (03/02/2023)  Housing: Low Risk  (05/23/2022)   Received from [facility]  Transportation Needs: No Transportation Needs (03/02/2023)  Utilities: Not At Risk (03/02/2023)  Alcohol Screen: Low Risk  (12/12/2020)  Depression (PHQ2-9): Low Risk  (03/02/2023)  Financial Resource Strain: Low Risk  (03/02/2023)  Physical Activity: Insufficiently Active (03/02/2023)  Social Connections: Moderately Isolated (03/02/2023)  Stress: No Stress Concern Present  (03/02/2023)  Tobacco Use: Medium Risk (03/05/2023)  Health Literacy: Adequate Health Literacy (03/02/2023)     Readmission Risk Interventions     No data to display

## 2023-03-09 NOTE — Telephone Encounter (Signed)
error 

## 2023-03-10 DIAGNOSIS — Z4789 Encounter for other orthopedic aftercare: Secondary | ICD-10-CM | POA: Diagnosis not present

## 2023-03-10 DIAGNOSIS — R269 Unspecified abnormalities of gait and mobility: Secondary | ICD-10-CM | POA: Diagnosis not present

## 2023-03-10 DIAGNOSIS — M6281 Muscle weakness (generalized): Secondary | ICD-10-CM | POA: Diagnosis not present

## 2023-03-12 ENCOUNTER — Encounter: Payer: Self-pay | Admitting: Orthopedic Surgery

## 2023-03-12 ENCOUNTER — Non-Acute Institutional Stay (SKILLED_NURSING_FACILITY): Payer: Medicare Other | Admitting: Orthopedic Surgery

## 2023-03-12 DIAGNOSIS — M6281 Muscle weakness (generalized): Secondary | ICD-10-CM | POA: Diagnosis not present

## 2023-03-12 DIAGNOSIS — I951 Orthostatic hypotension: Secondary | ICD-10-CM

## 2023-03-12 DIAGNOSIS — Z9889 Other specified postprocedural states: Secondary | ICD-10-CM

## 2023-03-12 DIAGNOSIS — E034 Atrophy of thyroid (acquired): Secondary | ICD-10-CM

## 2023-03-12 DIAGNOSIS — Z8781 Personal history of (healed) traumatic fracture: Secondary | ICD-10-CM | POA: Diagnosis not present

## 2023-03-12 DIAGNOSIS — F339 Major depressive disorder, recurrent, unspecified: Secondary | ICD-10-CM

## 2023-03-12 DIAGNOSIS — Z8673 Personal history of transient ischemic attack (TIA), and cerebral infarction without residual deficits: Secondary | ICD-10-CM

## 2023-03-12 DIAGNOSIS — I482 Chronic atrial fibrillation, unspecified: Secondary | ICD-10-CM

## 2023-03-12 DIAGNOSIS — Z4789 Encounter for other orthopedic aftercare: Secondary | ICD-10-CM | POA: Diagnosis not present

## 2023-03-12 DIAGNOSIS — R269 Unspecified abnormalities of gait and mobility: Secondary | ICD-10-CM | POA: Diagnosis not present

## 2023-03-12 NOTE — Progress Notes (Signed)
Location:  [facility] Room Number: A-9 Place of Service:  SNF (31) Provider: Hazle Nordmann, NP  Code Status: FULL CODE  Goals of Care:     03/12/2023    9:56 AM  Advanced Directives  Does Patient Have a Medical Advance Directive? Yes  Type of Estate agent of [place];Living will  Does patient want to make changes to medical advance directive? No - Patient declined  Copy of Healthcare Power of Attorney in Chart? Yes - validated most recent copy scanned in chart (See row information)     Chief Complaint  Patient presents with   Hospitalization Follow-up    HPI: Patient is a 89 y.o. female seen today for hospital follow-up s/p admission from [facility] 12/17-12/23.   She currently resides on the skilled nursing unit at [facility]. PMH: left MCA stroke 2015, TIA, atrial fib, MVR, HTN, HLD, Raynaud's, hypothyroidism, macular degeneration, GERD, CKD, and depression.   12/17 she stumbled off stool and fell on right side. In the ED, xray revealed acute fracture of right femoral neck. CXR with no infiltrate/effusion but showed cardiomegaly. 12/19 she underwent right hip hemiarthroplasty, anterior approach. EBL < 100 ml. BP was hypotensive for a brief time but normalized with IVF. She also had a brief period of confusion/agitation, thought to be postop delirium. Prior to procedure she stopped taking xarelto x 2 weeks due to cost. Xarelto was restarted for dvt prophylaxis/ chronic atrial fibrillation. Pain controlled with tylenol and norco. WBAT. Discharged to [facility] for PT/OT services.   Today, she reports mild pain to right hip. Pain increased with movement. Pain controlled with tylenol and norco prn. She has started PT/OT. Ambulating short distances with walker. Appetite fair. LBM yesterday per patient. She moved to [facility] about 2 months ago. 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 Husband is in respite at this time. Main support system is a local  friend. [place] plans to [place] back [place] apartment when possible. Afebrile. Vitals stable.   Ginko biloba stopped per pharmacy recommendation.   Past Medical History:  Diagnosis Date   Atrial fibrillation (HCC)    AFIB   Collagenous colitis    CVA (cerebral vascular accident) (HCC)    a.  L parietal CVA by MRI 12/12, likely embolic;  b.  TEE by report with neg bubble study;  c.  carotid dopplers  12/12:  + plaque, no sig ICA stenosis    History of postmenopausal HRT    Hx of adenomatous colonic polyps    Hypothyroidism    MVP (mitral valve prolapse)    a. s/p MV repair at [facility] in 2008;  b. Echocardiogram 7/12: EF 50%, status post mitral valve repair with mild MR and minimal MS, mean gradient 4, moderate LAE, LA diam 55 mm; mild RAE, PASP 28-32;    Osteoarthritis    Osteoporosis     Past Surgical History:  Procedure Laterality Date   ANTERIOR APPROACH HEMI HIP ARTHROPLASTY Right 03/05/2023   Procedure: ANTERIOR APPROACH HEMI HIP ARTHROPLASTY;  Surgeon: Samson Frederic, MD;  Location: [facility] ORS;  Service: Orthopedics;  Laterality: Right;   CARDIOVERSION  01/09/2012   Procedure: CARDIOVERSION;  Surgeon: Dolores Patty, MD;  Location: [facility] ENDOSCOPY;  Service: Cardiovascular;  Laterality: N/A;   CATARACT EXTRACTION     CHOLECYSTECTOMY     DILATION AND CURETTAGE OF UTERUS     LUMBAR FUSION     MITRAL VALVE REPAIR  2008   TONSILLECTOMY  Allergies  Allergen Reactions   Tape Other (See Comments)    SKIN TEARS AND BRUISES VERY EASILY   Rofecoxib Other (See Comments)    Vioxx- Elevated LFT's    Outpatient Encounter Medications as of 03/12/2023  Medication Sig   Calcium Citrate-Vitamin D (CITRACAL PETITES/VITAMIN D PO) Take 1 tablet by mouth 2 (two) times daily.   docusate sodium (COLACE) 100 MG capsule Take 1 capsule (100 mg total) by mouth 2 (two) times daily.   escitalopram (LEXAPRO) 10 MG tablet Take 1 tablet (10 mg total) by mouth daily.   fluticasone (FLONASE) 50 MCG/ACT  nasal spray Place 2 sprays into both nostrils daily.   Ginkgo Biloba (GINKOBA PO) Take 1 capsule by mouth 2 (two) times daily after a meal.   glucosamine-chondroitin 500-400 MG tablet Take 1 tablet by mouth 2 (two) times daily.   HYDROcodone-acetaminophen (NORCO/VICODIN) 5-325 MG tablet Take 1 tablet by mouth every 4 (four) hours as needed for up to 7 days for moderate pain (pain score 4-6) or severe pain (pain score 7-10).   levothyroxine (SYNTHROID) 75 MCG tablet TAKE 1 TABLET BY MOUTH EVERY DAY BEFORE BREAKFAST   loratadine (CLARITIN) 10 MG tablet Take 1 tablet (10 mg total) by mouth daily.   methocarbamol (ROBAXIN) 500 MG tablet Take 1 tablet (500 mg total) by mouth every 8 (eight) hours as needed for muscle spasms.   metoprolol tartrate (LOPRESSOR) 25 MG tablet Take 1 tablet (25 mg total) by mouth 2 (two) times daily.   Multiple Vitamins-Minerals (PRESERVISION AREDS 2 PO) Take 1 capsule by mouth 2 (two) times daily. Reported on 09/04/2015   polyethylene glycol (MIRALAX / GLYCOLAX) 17 g packet Take 17 g by mouth daily as needed for mild constipation.   Rivaroxaban (XARELTO) 15 MG TABS tablet Take 1 tablet (15 mg total) by mouth daily with supper.   rosuvastatin (CRESTOR) 10 MG tablet TAKE 1 TABLET BY MOUTH DAILY. GENERIC EQUIVALENT FOR CRESTOR   Saline (SIMPLY SALINE) 0.9 % AERS Place 2 each into the nose daily as needed.   TYLENOL 8 HOUR ARTHRITIS PAIN 650 MG CR tablet Take 650 mg by mouth in the morning.   vitamin A 16109 UNIT capsule Take 1 capsule by mouth daily.   zinc oxide 20 % ointment Apply 1 Application topically as needed for irritation.   No facility-administered encounter medications on file as of 03/12/2023.    Review of Systems:  Review of Systems  Constitutional:  Negative for activity change and appetite change.  HENT:  Negative for sore throat and trouble swallowing.   Eyes:  Positive for visual disturbance.  Respiratory:  Negative for cough, shortness of breath and  wheezing.   Cardiovascular:  Negative for chest pain and leg swelling.  Gastrointestinal:  Negative for abdominal distention and abdominal pain.  Genitourinary:  Negative for dysuria.  Musculoskeletal:  Positive for arthralgias and gait problem.  Skin:  Positive for wound.  Neurological:  Positive for weakness. Negative for dizziness and headaches.  Psychiatric/Behavioral:  Positive for dysphoric mood. Negative for confusion and sleep disturbance. The patient is not nervous/anxious.     Health Maintenance  Topic Date Due   COVID-19 Vaccine (5 - 2024-25 season) 11/16/2022   Medicare Annual Wellness (AWV)  03/01/2024   DTaP/Tdap/Td (5 - Td or Tdap) 02/23/2031   Pneumonia Vaccine 79+ Years old  Completed   INFLUENZA VACCINE  Completed   DEXA SCAN  Completed   Zoster Vaccines- Shingrix  Completed   HPV VACCINES  Aged  Out    Physical Exam: Vitals:   03/12/23 0942  BP: 116/67  Pulse: 74  Resp: 18  Temp: (!) 97.5 F (36.4 C)  SpO2: 98%  Weight: 98 lb (44.5 kg)  Height: 5' (1.524 m)   Body mass index is 19.14 kg/m. Physical Exam Vitals reviewed.  Constitutional:      General: She is not in acute distress. HENT:     Head: Normocephalic.  Eyes:     General:        Right eye: No discharge.        Left eye: No discharge.  Cardiovascular:     Rate and Rhythm: Normal rate. Rhythm irregular.     Pulses: Normal pulses.     Heart sounds: Normal heart sounds.  Pulmonary:     Effort: Pulmonary effort is normal.     Breath sounds: Normal breath sounds.  Abdominal:     General: Bowel sounds are normal.     Palpations: Abdomen is soft.  Musculoskeletal:     Cervical back: Neck supple.     Right lower leg: No edema.     Left lower leg: No edema.     Comments: WBAT  Skin:    General: Skin is warm.     Capillary Refill: Capillary refill takes less than 2 seconds.     Comments: Right hip surgical dressing, CDI  Neurological:     General: No focal deficit present.     Mental  Status: She is alert and oriented to person, place, and time.     Motor: Weakness present.     Gait: Gait abnormal.     Comments: FWW  Psychiatric:        Mood and Affect: Mood normal.     Labs reviewed: Basic Metabolic Panel: Recent Labs    03/03/23 2200 03/05/23 0404 03/05/23 0748 03/06/23 0337 03/07/23 0352 03/08/23 0352  NA  --   --  136 130* 133* 139  K  --   --  4.0 4.0 4.6 4.4  CL  --   --  102 97* 98 103  CO2  --   --  27 26 29 30   GLUCOSE  --   --  133* 154* 131* 95  BUN  --   --  22 25* 34* 27*  CREATININE  --   --  0.60 0.93 0.89 0.64  CALCIUM  --   --  8.2* 7.7* 8.5* 8.6*  MG  --   --  2.1  --  2.1 2.2  PHOS   < >  --  1.9* 3.1 1.6* 3.1  TSH  --  1.775  --   --   --   --    < > = values in this interval not displayed.   Liver Function Tests: Recent Labs    05/26/22 1200 03/03/23 2200 03/05/23 0748 03/06/23 0337 03/07/23 0352 03/08/23 0352  AST 28 29  --   --   --   --   ALT 36* 42  --   --   --   --   ALKPHOS 73 81  --   --   --   --   BILITOT 0.4 0.7  --   --   --   --   PROT 6.3 5.9*  --   --   --   --   ALBUMIN 4.2 3.6   < > 3.0* 2.8* 2.8*   < > = values in this interval not  displayed.   No results for input(s): "LIPASE", "AMYLASE" in the last 8760 hours. No results for input(s): "AMMONIA" in the last 8760 hours. CBC: Recent Labs    03/03/23 2124 03/05/23 0748 03/06/23 0337 03/07/23 0352 03/08/23 0352  WBC 10.9*   < > 10.9* 13.3* 9.5  NEUTROABS 9.0*  --   --   --   --   HGB 13.4   < > 12.9 11.7* 11.4*  HCT 41.0   < > 39.1 36.3 34.7*  MCV 102.2*   < > 101.6* 100.6* 99.7  PLT 150   < > 147* 173 167   < > = values in this interval not displayed.   Lipid Panel: No results for input(s): "CHOL", "HDL", "LDLCALC", "TRIG", "CHOLHDL", "LDLDIRECT" in the last 8760 hours. Lab Results  Component Value Date   HGBA1C 5.5 03/06/2023    Procedures since last visit: Chest Portable 1 View Result Date: 03/03/2023 CLINICAL DATA:  Fall EXAM:  PORTABLE CHEST 1 VIEW COMPARISON:  Chest x-ray 01/27/2022 FINDINGS: Heart is enlarged. Prosthetic heart valve is present. The lungs are clear. There is no pleural effusion or pneumothorax. There are healed left lower rib fractures. IMPRESSION: Cardiomegaly. No acute cardiopulmonary process. Electronically Signed   By: Darliss Cheney M.D.   On: 03/03/2023 23:59   DG Hip Unilat W or Wo Pelvis 2-3 Views Right Result Date: 03/03/2023 CLINICAL DATA:  Fall, pain EXAM: DG HIP (WITH OR WITHOUT PELVIS) 2-3V RIGHT; RIGHT FEMUR 2 VIEWS COMPARISON:  02/18/2018 FINDINGS: Acute fracture of the right femoral neck with mild impaction. Fracture appears to be primarily subcapital in location. No evidence of fracture involvement of the intertrochanteric region. Hip joint intact without dislocation. Degenerative changes of the knee. Soft tissue swelling is evident at the hip. IMPRESSION: Acute fracture of the right femoral neck with mild impaction. Electronically Signed   By: Duanne Guess D.O.   On: 03/03/2023 20:59   DG FEMUR, MIN 2 VIEWS RIGHT Result Date: 03/03/2023 CLINICAL DATA:  Fall, pain EXAM: DG HIP (WITH OR WITHOUT PELVIS) 2-3V RIGHT; RIGHT FEMUR 2 VIEWS COMPARISON:  02/18/2018 FINDINGS: Acute fracture of the right femoral neck with mild impaction. Fracture appears to be primarily subcapital in location. No evidence of fracture involvement of the intertrochanteric region. Hip joint intact without dislocation. Degenerative changes of the knee. Soft tissue swelling is evident at the hip. IMPRESSION: Acute fracture of the right femoral neck with mild impaction. Electronically Signed   By: Duanne Guess D.O.   On: 03/03/2023 20:59    Assessment/Plan 1. S/P right hip fracture (Primary) - 12/17 fall at home - 12/19 underwent right hip hemiarthroplasty - WBAT - pain tolerated with tylenol, norco prn - cont PT/OT  2. Atrial fibrillation, chronic (HCC) - HR< 100 - stopped xarelto x 2 weeks due to cost -  Xarelto restarted after recent surgery - cont metoprolol for rate control  3. Orthostatic hypotension - postop complication - resolved with IVF  4. Hypothyroidism due to acquired atrophy of thyroid - TSH 1.775 03/05/2023 - cont levothyroxine  5. History of mitral valve repair  6. History of ischemic left MCA stroke - cont xarelto and rosuvastatin  7. Recurrent depression (HCC) - Na+ stable - cont Lexapro    Labs/tests ordered:  none Next appt:  Visit date not found

## 2023-03-13 DIAGNOSIS — R269 Unspecified abnormalities of gait and mobility: Secondary | ICD-10-CM | POA: Diagnosis not present

## 2023-03-13 DIAGNOSIS — M6281 Muscle weakness (generalized): Secondary | ICD-10-CM | POA: Diagnosis not present

## 2023-03-13 DIAGNOSIS — Z9181 History of falling: Secondary | ICD-10-CM | POA: Diagnosis not present

## 2023-03-13 DIAGNOSIS — Z4789 Encounter for other orthopedic aftercare: Secondary | ICD-10-CM | POA: Diagnosis not present

## 2023-03-15 DIAGNOSIS — Z4789 Encounter for other orthopedic aftercare: Secondary | ICD-10-CM | POA: Diagnosis not present

## 2023-03-15 DIAGNOSIS — M6281 Muscle weakness (generalized): Secondary | ICD-10-CM | POA: Diagnosis not present

## 2023-03-15 DIAGNOSIS — R269 Unspecified abnormalities of gait and mobility: Secondary | ICD-10-CM | POA: Diagnosis not present

## 2023-03-16 DIAGNOSIS — M6281 Muscle weakness (generalized): Secondary | ICD-10-CM | POA: Diagnosis not present

## 2023-03-16 DIAGNOSIS — Z4789 Encounter for other orthopedic aftercare: Secondary | ICD-10-CM | POA: Diagnosis not present

## 2023-03-16 DIAGNOSIS — R269 Unspecified abnormalities of gait and mobility: Secondary | ICD-10-CM | POA: Diagnosis not present

## 2023-03-16 DIAGNOSIS — Z9181 History of falling: Secondary | ICD-10-CM | POA: Diagnosis not present

## 2023-03-17 DIAGNOSIS — R269 Unspecified abnormalities of gait and mobility: Secondary | ICD-10-CM | POA: Diagnosis not present

## 2023-03-17 DIAGNOSIS — Z4789 Encounter for other orthopedic aftercare: Secondary | ICD-10-CM | POA: Diagnosis not present

## 2023-03-17 DIAGNOSIS — M6281 Muscle weakness (generalized): Secondary | ICD-10-CM | POA: Diagnosis not present

## 2023-03-17 DIAGNOSIS — Z9181 History of falling: Secondary | ICD-10-CM | POA: Diagnosis not present

## 2023-03-18 DIAGNOSIS — M6281 Muscle weakness (generalized): Secondary | ICD-10-CM | POA: Diagnosis not present

## 2023-03-18 DIAGNOSIS — Z4789 Encounter for other orthopedic aftercare: Secondary | ICD-10-CM | POA: Diagnosis not present

## 2023-03-18 DIAGNOSIS — R269 Unspecified abnormalities of gait and mobility: Secondary | ICD-10-CM | POA: Diagnosis not present

## 2023-03-18 DIAGNOSIS — Z9181 History of falling: Secondary | ICD-10-CM | POA: Diagnosis not present

## 2023-03-19 ENCOUNTER — Non-Acute Institutional Stay (SKILLED_NURSING_FACILITY): Payer: Self-pay | Admitting: Internal Medicine

## 2023-03-19 DIAGNOSIS — R269 Unspecified abnormalities of gait and mobility: Secondary | ICD-10-CM | POA: Diagnosis not present

## 2023-03-19 DIAGNOSIS — F339 Major depressive disorder, recurrent, unspecified: Secondary | ICD-10-CM

## 2023-03-19 DIAGNOSIS — Z8781 Personal history of (healed) traumatic fracture: Secondary | ICD-10-CM | POA: Diagnosis not present

## 2023-03-19 DIAGNOSIS — Z8673 Personal history of transient ischemic attack (TIA), and cerebral infarction without residual deficits: Secondary | ICD-10-CM

## 2023-03-19 DIAGNOSIS — I951 Orthostatic hypotension: Secondary | ICD-10-CM | POA: Diagnosis not present

## 2023-03-19 DIAGNOSIS — Z4789 Encounter for other orthopedic aftercare: Secondary | ICD-10-CM | POA: Diagnosis not present

## 2023-03-19 DIAGNOSIS — E034 Atrophy of thyroid (acquired): Secondary | ICD-10-CM

## 2023-03-19 DIAGNOSIS — I482 Chronic atrial fibrillation, unspecified: Secondary | ICD-10-CM

## 2023-03-19 DIAGNOSIS — Z9889 Other specified postprocedural states: Secondary | ICD-10-CM

## 2023-03-19 DIAGNOSIS — M6281 Muscle weakness (generalized): Secondary | ICD-10-CM | POA: Diagnosis not present

## 2023-03-20 ENCOUNTER — Encounter: Payer: Self-pay | Admitting: Internal Medicine

## 2023-03-20 DIAGNOSIS — Z4789 Encounter for other orthopedic aftercare: Secondary | ICD-10-CM | POA: Diagnosis not present

## 2023-03-20 DIAGNOSIS — M6281 Muscle weakness (generalized): Secondary | ICD-10-CM | POA: Diagnosis not present

## 2023-03-20 DIAGNOSIS — R269 Unspecified abnormalities of gait and mobility: Secondary | ICD-10-CM | POA: Diagnosis not present

## 2023-03-20 NOTE — Progress Notes (Signed)
 Provider:   Location:  [facility] Curator of Service:  SNF (31)  PCP: Katrinka Garnette KIDD, MD Patient Care Team: Katrinka Garnette KIDD, MD as PCP - General (Family Medicine) Delford Maude BROCKS, MD as PCP - Cardiology (Cardiology) Rosan Credit, MD as Consulting Physician (Ophthalmology) Sharalyn, Janina GRADE, MD as Referring Physician (Ophthalmology) Buck Saucer, MD as Consulting Physician (Neurology) Nicholaus Sherlean CROME, [facility] (Inactive) as Pharmacist (Pharmacist)  Extended Emergency Contact Information Primary Emergency Contact: Lee,Robert Address: [address]          [place], [place] [address] [place] Home Phone: [phone] Relation: Spouse Secondary Emergency Contact: McGinnis,Eileen Mobile Phone: [phone] Relation: Other  Code Status: Ful Code Goals of Care: Advanced Directive information    03/12/2023    9:56 AM  Advanced Directives  Does Patient Have a Medical Advance Directive? Yes  Type of Estate Agent of [place];Living will  Does patient want to make changes to medical advance directive? No - Patient declined  Copy of Healthcare Power of Attorney in Chart? Yes - validated most recent copy scanned in chart (See row information)      Chief Complaint  Patient presents with   New Admit To SNF    HPI: Patient is a 89 y.o. female seen today for admission to SNF  Patient was admitted in the hospital after sustaining acute fracture of right femoral neck after a fall Patient was recently mated to [facility] [place] with her husband.  She is the main caregiver for him.  He has history of dementia.  She also has a history of macular degeneration with poor vision. She also has a history of left MCA stroke in 2015, atrial fibrillation, hypertension, hypothyroidism, GERD, CKD, depression  Patient stumbled off a stool and fell on her right side in her apartment.  She was found to have acute fracture of right femoral  neck.  She underwent right hemiarthroplasty on 12/19.  Patient had some postop confusion.  Her mental status is now back to normal she is already putting weight on the leg and able to do her transfers with no help.  Her pain seems to be controlled.  She is able to ambulate with a walker. The nurses have noticed the patient has some mild cognitive deficit.  Past Medical History:  Diagnosis Date   Atrial fibrillation (HCC)    AFIB   Collagenous colitis    CVA (cerebral vascular accident) (HCC)    a.  L parietal CVA by MRI 12/12, likely embolic;  b.  TEE by report with neg bubble study;  c.  carotid dopplers  12/12:  + plaque, no sig ICA stenosis    History of postmenopausal HRT    Hx of adenomatous colonic polyps    Hypothyroidism    MVP (mitral valve prolapse)    a. s/p MV repair at [facility] in 2008;  b. Echocardiogram 7/12: EF 50%, status post mitral valve repair with mild MR and minimal MS, mean gradient 4, moderate LAE, LA diam 55 mm; mild RAE, PASP 28-32;    Osteoarthritis    Osteoporosis    Past Surgical History:  Procedure Laterality Date   ANTERIOR APPROACH HEMI HIP ARTHROPLASTY Right 03/05/2023   Procedure: ANTERIOR APPROACH HEMI HIP ARTHROPLASTY;  Surgeon: Fidel Rogue, MD;  Location: [facility] ORS;  Service: Orthopedics;  Laterality: Right;   CARDIOVERSION  01/09/2012   Procedure: CARDIOVERSION;  Surgeon: Toribio JONELLE Fuel, MD;  Location: [facility] ENDOSCOPY;  Service:  Cardiovascular;  Laterality: N/A;   CATARACT EXTRACTION     CHOLECYSTECTOMY     DILATION AND CURETTAGE OF UTERUS     LUMBAR FUSION     MITRAL VALVE REPAIR  2008   TONSILLECTOMY      reports that she quit smoking about 56 years ago. Her smoking use included cigarettes. She has never used smokeless tobacco. She reports current alcohol use of about 7.0 standard drinks of alcohol per week. She reports that she does not use drugs. Social History   Socioeconomic History   Marital status: Married    Spouse name: Not on file    Number of children: Not on file   Years of education: Not on file   Highest education level: Master's degree (e.g., MA, MS, MEng, MEd, MSW, MBA)  Occupational History   Occupation: RETIRED    Employer: RETIRED    Comment: psychiatric nurse  Tobacco Use   Smoking status: Former    Current packs/day: 0.00    Types: Cigarettes    Quit date: 04/28/1966    Years since quitting: 56.9   Smokeless tobacco: Never  Vaping Use   Vaping status: Never Used  Substance and Sexual Activity   Alcohol use: Yes    Alcohol/week: 7.0 standard drinks of alcohol    Types: 7 Glasses of wine per week    Comment: 1 small glass with luch and dinner   Drug use: No   Sexual activity: Not Currently  Other Topics Concern   Not on file  Social History Narrative   Married for 12 years-1st marriage for both. No kids. No pets.       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   Lived in [place] for 10 years prior (masters in music and music education)-undergrad at [employer]: eating out, opera in [place]   Social Drivers of Health   Financial Resource Strain: Low Risk  (03/02/2023)   Overall Financial Resource Strain (CARDIA)    Difficulty of Paying Living Expenses: Not hard at all  Food Insecurity: No Food Insecurity (03/02/2023)   Hunger Vital Sign    Worried About Running Out of Food in the Last Year: Never true    Ran Out of Food in the Last Year: Never true  Transportation Needs: No Transportation Needs (03/02/2023)   PRAPARE - Administrator, Civil Service (Medical): No    Lack of Transportation (Non-Medical): No  Physical Activity: Insufficiently Active (03/02/2023)   Exercise Vital Sign    Days of Exercise per Week: 5 days    Minutes of Exercise per Session: 20 min  Stress: No Stress Concern Present (03/02/2023)   [employer] of Occupational Health - Occupational Stress Questionnaire    Feeling of Stress : Not at all  Social Connections:  Moderately Isolated (03/02/2023)   Social Connection and Isolation Panel [NHANES]    Frequency of Communication with Friends and Family: Once a week    Frequency of Social Gatherings with Friends and Family: More than three times a week    Attends Religious Services: Never    Database Administrator or Organizations: No    Attends Banker Meetings: Never    Marital Status: Married  Catering Manager Violence: Not At Risk (03/02/2023)   Humiliation, Afraid, Rape, and Kick questionnaire    Fear of Current or Ex-Partner: No    Emotionally Abused: No    Physically  Abused: No    Sexually Abused: No    Functional Status Survey:    Family History  Problem Relation Age of Onset   Cancer Father        COLON   Stroke Neg Hx        1ST DEGREE RELATIVE <60    Health Maintenance  Topic Date Due   COVID-19 Vaccine (5 - 2024-25 season) 11/16/2022   Medicare Annual Wellness (AWV)  03/01/2024   DTaP/Tdap/Td (5 - Td or Tdap) 02/23/2031   Pneumonia Vaccine 66+ Years old  Completed   INFLUENZA VACCINE  Completed   DEXA SCAN  Completed   Zoster Vaccines- Shingrix  Completed   HPV VACCINES  Aged Out    Allergies  Allergen Reactions   Tape Other (See Comments)    SKIN TEARS AND BRUISES VERY EASILY   Rofecoxib Other (See Comments)    Vioxx- Elevated LFT's    Outpatient Encounter Medications as of 03/19/2023  Medication Sig   Calcium  Citrate-Vitamin D  (CITRACAL PETITES/VITAMIN D  PO) Take 1 tablet by mouth 2 (two) times daily.   docusate sodium  (COLACE) 100 MG capsule Take 1 capsule (100 mg total) by mouth 2 (two) times daily.   escitalopram  (LEXAPRO ) 10 MG tablet Take 1 tablet (10 mg total) by mouth daily.   fluticasone  (FLONASE ) 50 MCG/ACT nasal spray Place 2 sprays into both nostrils daily.   glucosamine-chondroitin 500-400 MG tablet Take 1 tablet by mouth 2 (two) times daily.   levothyroxine  (SYNTHROID ) 75 MCG tablet TAKE 1 TABLET BY MOUTH EVERY DAY BEFORE BREAKFAST    loratadine  (CLARITIN ) 10 MG tablet Take 1 tablet (10 mg total) by mouth daily.   methocarbamol  (ROBAXIN ) 500 MG tablet Take 1 tablet (500 mg total) by mouth every 8 (eight) hours as needed for muscle spasms.   metoprolol  tartrate (LOPRESSOR ) 25 MG tablet Take 1 tablet (25 mg total) by mouth 2 (two) times daily.   Multiple Vitamins-Minerals (PRESERVISION AREDS 2 PO) Take 1 capsule by mouth 2 (two) times daily. Reported on 09/04/2015   polyethylene glycol (MIRALAX  / GLYCOLAX ) 17 g packet Take 17 g by mouth daily as needed for mild constipation.   Rivaroxaban  (XARELTO ) 15 MG TABS tablet Take 1 tablet (15 mg total) by mouth daily with supper.   rosuvastatin  (CRESTOR ) 10 MG tablet TAKE 1 TABLET BY MOUTH DAILY. GENERIC EQUIVALENT FOR CRESTOR    Saline (SIMPLY SALINE) 0.9 % AERS Place 2 each into the nose daily as needed.   TYLENOL  8 HOUR ARTHRITIS PAIN 650 MG CR tablet Take 650 mg by mouth in the morning.   vitamin A 10000 UNIT capsule Take 1 capsule by mouth daily.   zinc  oxide 20 % ointment Apply 1 Application topically as needed for irritation.   No facility-administered encounter medications on file as of 03/19/2023.    Review of Systems  Constitutional:  Negative for activity change and appetite change.  HENT: Negative.    Respiratory:  Negative for cough and shortness of breath.   Cardiovascular:  Negative for leg swelling.  Gastrointestinal:  Negative for constipation.  Genitourinary: Negative.   Musculoskeletal:  Positive for gait problem. Negative for arthralgias and myalgias.  Skin: Negative.   Neurological:  Negative for dizziness and weakness.  Psychiatric/Behavioral:  Negative for confusion, dysphoric mood and sleep disturbance.     Vitals:   03/20/23 1646  BP: 134/73  Pulse: 98  Resp: 18  Temp: 98 F (36.7 C)  Weight: 96 lb (43.5 kg)   Body mass  index is 18.75 kg/m. Physical Exam Vitals reviewed.  Constitutional:      Appearance: Normal appearance.  HENT:     Head:  Normocephalic.     Nose: Nose normal.     Mouth/Throat:     Mouth: Mucous membranes are moist.     Pharynx: Oropharynx is clear.  Eyes:     Pupils: Pupils are equal, round, and reactive to light.  Cardiovascular:     Rate and Rhythm: Normal rate and regular rhythm.     Pulses: Normal pulses.     Heart sounds: Normal heart sounds. No murmur heard. Pulmonary:     Effort: Pulmonary effort is normal.     Breath sounds: Normal breath sounds.  Abdominal:     General: Abdomen is flat. Bowel sounds are normal.     Palpations: Abdomen is soft.  Musculoskeletal:        General: No swelling.     Cervical back: Neck supple.  Skin:    General: Skin is warm.  Neurological:     General: No focal deficit present.     Mental Status: She is alert and oriented to person, place, and time.  Psychiatric:        Mood and Affect: Mood normal.        Thought Content: Thought content normal.     Labs reviewed: Basic Metabolic Panel: Recent Labs    03/05/23 0748 03/06/23 0337 03/07/23 0352 03/08/23 0352  NA 136 130* 133* 139  K 4.0 4.0 4.6 4.4  CL 102 97* 98 103  CO2 27 26 29 30   GLUCOSE 133* 154* 131* 95  BUN 22 25* 34* 27*  CREATININE 0.60 0.93 0.89 0.64  CALCIUM  8.2* 7.7* 8.5* 8.6*  MG 2.1  --  2.1 2.2  PHOS 1.9* 3.1 1.6* 3.1   Liver Function Tests: Recent Labs    05/26/22 1200 03/03/23 2200 03/05/23 0748 03/06/23 0337 03/07/23 0352 03/08/23 0352  AST 28 29  --   --   --   --   ALT 36* 42  --   --   --   --   ALKPHOS 73 81  --   --   --   --   BILITOT 0.4 0.7  --   --   --   --   PROT 6.3 5.9*  --   --   --   --   ALBUMIN 4.2 3.6   < > 3.0* 2.8* 2.8*   < > = values in this interval not displayed.   No results for input(s): LIPASE, AMYLASE in the last 8760 hours. No results for input(s): AMMONIA in the last 8760 hours. CBC: Recent Labs    03/03/23 2124 03/05/23 0748 03/06/23 0337 03/07/23 0352 03/08/23 0352  WBC 10.9*   < > 10.9* 13.3* 9.5  NEUTROABS 9.0*   --   --   --   --   HGB 13.4   < > 12.9 11.7* 11.4*  HCT 41.0   < > 39.1 36.3 34.7*  MCV 102.2*   < > 101.6* 100.6* 99.7  PLT 150   < > 147* 173 167   < > = values in this interval not displayed.   Cardiac Enzymes: No results for input(s): CKTOTAL, CKMB, CKMBINDEX, TROPONINI in the last 8760 hours. BNP: Invalid input(s): POCBNP Lab Results  Component Value Date   HGBA1C 5.5 03/06/2023   Lab Results  Component Value Date   TSH 1.775 03/05/2023   Lab Results  Component Value Date   VITAMINB12 996 (H) 11/25/2021   Lab Results  Component Value Date   FOLATE >24.8 09/04/2014   Lab Results  Component Value Date   IRON 86 07/07/2006    Imaging and Procedures obtained prior to SNF admission: Chest Portable 1 View Result Date: 03/03/2023 CLINICAL DATA:  Fall EXAM: PORTABLE CHEST 1 VIEW COMPARISON:  Chest x-ray 01/27/2022 FINDINGS: Heart is enlarged. Prosthetic heart valve is present. The lungs are clear. There is no pleural effusion or pneumothorax. There are healed left lower rib fractures. IMPRESSION: Cardiomegaly. No acute cardiopulmonary process. Electronically Signed   By: Greig Pique M.D.   On: 03/03/2023 23:59   DG Hip Unilat W or Wo Pelvis 2-3 Views Right Result Date: 03/03/2023 CLINICAL DATA:  Fall, pain EXAM: DG HIP (WITH OR WITHOUT PELVIS) 2-3V RIGHT; RIGHT FEMUR 2 VIEWS COMPARISON:  02/18/2018 FINDINGS: Acute fracture of the right femoral neck with mild impaction. Fracture appears to be primarily subcapital in location. No evidence of fracture involvement of the intertrochanteric region. Hip joint intact without dislocation. Degenerative changes of the knee. Soft tissue swelling is evident at the hip. IMPRESSION: Acute fracture of the right femoral neck with mild impaction. Electronically Signed   By: Mabel Converse D.O.   On: 03/03/2023 20:59   DG FEMUR, MIN 2 VIEWS RIGHT Result Date: 03/03/2023 CLINICAL DATA:  Fall, pain EXAM: DG HIP (WITH OR WITHOUT  PELVIS) 2-3V RIGHT; RIGHT FEMUR 2 VIEWS COMPARISON:  02/18/2018 FINDINGS: Acute fracture of the right femoral neck with mild impaction. Fracture appears to be primarily subcapital in location. No evidence of fracture involvement of the intertrochanteric region. Hip joint intact without dislocation. Degenerative changes of the knee. Soft tissue swelling is evident at the hip. IMPRESSION: Acute fracture of the right femoral neck with mild impaction. Electronically Signed   By: Mabel Converse D.O.   On: 03/03/2023 20:59    Assessment/Plan 1. S/P right hip fracture (Primary) Doing well On Xarelto  Doing well with therapy Follow with Ortho   2. Atrial fibrillation, chronic (HCC) On Xarelto  and Lopressor   3. Orthostatic hypotension Not having any issues here will monitor  4. Hypothyroidism due to acquired atrophy of thyroid  TSH normal in 12/24  5. History of mitral valve repair   6. History of ischemic left MCA stroke Xarelto  and statin  7. Recurrent depression (HCC) Lexapro   She [place] with [place] as he needs help with ADLS and she would not be able to do that now.  Family/ staff Communication:   Labs/tests ordered:

## 2023-03-23 DIAGNOSIS — S72001D Fracture of unspecified part of neck of right femur, subsequent encounter for closed fracture with routine healing: Secondary | ICD-10-CM | POA: Diagnosis not present

## 2023-03-23 DIAGNOSIS — Z471 Aftercare following joint replacement surgery: Secondary | ICD-10-CM | POA: Diagnosis not present

## 2023-03-23 DIAGNOSIS — R269 Unspecified abnormalities of gait and mobility: Secondary | ICD-10-CM | POA: Diagnosis not present

## 2023-03-23 DIAGNOSIS — R41841 Cognitive communication deficit: Secondary | ICD-10-CM | POA: Diagnosis not present

## 2023-03-23 DIAGNOSIS — Z4789 Encounter for other orthopedic aftercare: Secondary | ICD-10-CM | POA: Diagnosis not present

## 2023-03-23 DIAGNOSIS — S7291XA Unspecified fracture of right femur, initial encounter for closed fracture: Secondary | ICD-10-CM | POA: Diagnosis not present

## 2023-03-23 DIAGNOSIS — M6281 Muscle weakness (generalized): Secondary | ICD-10-CM | POA: Diagnosis not present

## 2023-03-23 DIAGNOSIS — Z9181 History of falling: Secondary | ICD-10-CM | POA: Diagnosis not present

## 2023-03-24 DIAGNOSIS — R41841 Cognitive communication deficit: Secondary | ICD-10-CM | POA: Diagnosis not present

## 2023-03-24 DIAGNOSIS — Z9181 History of falling: Secondary | ICD-10-CM | POA: Diagnosis not present

## 2023-03-24 DIAGNOSIS — S7291XA Unspecified fracture of right femur, initial encounter for closed fracture: Secondary | ICD-10-CM | POA: Diagnosis not present

## 2023-03-24 DIAGNOSIS — R269 Unspecified abnormalities of gait and mobility: Secondary | ICD-10-CM | POA: Diagnosis not present

## 2023-03-24 DIAGNOSIS — M6281 Muscle weakness (generalized): Secondary | ICD-10-CM | POA: Diagnosis not present

## 2023-03-24 DIAGNOSIS — Z4789 Encounter for other orthopedic aftercare: Secondary | ICD-10-CM | POA: Diagnosis not present

## 2023-03-25 DIAGNOSIS — Z9181 History of falling: Secondary | ICD-10-CM | POA: Diagnosis not present

## 2023-03-25 DIAGNOSIS — H34831 Tributary (branch) retinal vein occlusion, right eye, with macular edema: Secondary | ICD-10-CM | POA: Diagnosis not present

## 2023-03-25 DIAGNOSIS — H353221 Exudative age-related macular degeneration, left eye, with active choroidal neovascularization: Secondary | ICD-10-CM | POA: Diagnosis not present

## 2023-03-25 DIAGNOSIS — R269 Unspecified abnormalities of gait and mobility: Secondary | ICD-10-CM | POA: Diagnosis not present

## 2023-03-25 DIAGNOSIS — R41841 Cognitive communication deficit: Secondary | ICD-10-CM | POA: Diagnosis not present

## 2023-03-25 DIAGNOSIS — H43813 Vitreous degeneration, bilateral: Secondary | ICD-10-CM | POA: Diagnosis not present

## 2023-03-25 DIAGNOSIS — S7291XA Unspecified fracture of right femur, initial encounter for closed fracture: Secondary | ICD-10-CM | POA: Diagnosis not present

## 2023-03-25 DIAGNOSIS — M6281 Muscle weakness (generalized): Secondary | ICD-10-CM | POA: Diagnosis not present

## 2023-03-25 DIAGNOSIS — Z4789 Encounter for other orthopedic aftercare: Secondary | ICD-10-CM | POA: Diagnosis not present

## 2023-03-25 DIAGNOSIS — H35363 Drusen (degenerative) of macula, bilateral: Secondary | ICD-10-CM | POA: Diagnosis not present

## 2023-03-26 DIAGNOSIS — R269 Unspecified abnormalities of gait and mobility: Secondary | ICD-10-CM | POA: Diagnosis not present

## 2023-03-26 DIAGNOSIS — Z9181 History of falling: Secondary | ICD-10-CM | POA: Diagnosis not present

## 2023-03-26 DIAGNOSIS — Z4789 Encounter for other orthopedic aftercare: Secondary | ICD-10-CM | POA: Diagnosis not present

## 2023-03-26 DIAGNOSIS — M6281 Muscle weakness (generalized): Secondary | ICD-10-CM | POA: Diagnosis not present

## 2023-03-27 DIAGNOSIS — M6281 Muscle weakness (generalized): Secondary | ICD-10-CM | POA: Diagnosis not present

## 2023-03-27 DIAGNOSIS — S7291XA Unspecified fracture of right femur, initial encounter for closed fracture: Secondary | ICD-10-CM | POA: Diagnosis not present

## 2023-03-27 DIAGNOSIS — R269 Unspecified abnormalities of gait and mobility: Secondary | ICD-10-CM | POA: Diagnosis not present

## 2023-03-27 DIAGNOSIS — Z4789 Encounter for other orthopedic aftercare: Secondary | ICD-10-CM | POA: Diagnosis not present

## 2023-03-27 DIAGNOSIS — R41841 Cognitive communication deficit: Secondary | ICD-10-CM | POA: Diagnosis not present

## 2023-03-30 DIAGNOSIS — M6281 Muscle weakness (generalized): Secondary | ICD-10-CM | POA: Diagnosis not present

## 2023-03-30 DIAGNOSIS — R269 Unspecified abnormalities of gait and mobility: Secondary | ICD-10-CM | POA: Diagnosis not present

## 2023-03-30 DIAGNOSIS — Z4789 Encounter for other orthopedic aftercare: Secondary | ICD-10-CM | POA: Diagnosis not present

## 2023-03-31 DIAGNOSIS — M6281 Muscle weakness (generalized): Secondary | ICD-10-CM | POA: Diagnosis not present

## 2023-03-31 DIAGNOSIS — R269 Unspecified abnormalities of gait and mobility: Secondary | ICD-10-CM | POA: Diagnosis not present

## 2023-03-31 DIAGNOSIS — Z4789 Encounter for other orthopedic aftercare: Secondary | ICD-10-CM | POA: Diagnosis not present

## 2023-03-31 DIAGNOSIS — Z9181 History of falling: Secondary | ICD-10-CM | POA: Diagnosis not present

## 2023-03-31 DIAGNOSIS — R41841 Cognitive communication deficit: Secondary | ICD-10-CM | POA: Diagnosis not present

## 2023-03-31 DIAGNOSIS — S7291XA Unspecified fracture of right femur, initial encounter for closed fracture: Secondary | ICD-10-CM | POA: Diagnosis not present

## 2023-04-01 DIAGNOSIS — R269 Unspecified abnormalities of gait and mobility: Secondary | ICD-10-CM | POA: Diagnosis not present

## 2023-04-01 DIAGNOSIS — R41841 Cognitive communication deficit: Secondary | ICD-10-CM | POA: Diagnosis not present

## 2023-04-01 DIAGNOSIS — S7291XA Unspecified fracture of right femur, initial encounter for closed fracture: Secondary | ICD-10-CM | POA: Diagnosis not present

## 2023-04-01 DIAGNOSIS — Z9181 History of falling: Secondary | ICD-10-CM | POA: Diagnosis not present

## 2023-04-01 DIAGNOSIS — Z4789 Encounter for other orthopedic aftercare: Secondary | ICD-10-CM | POA: Diagnosis not present

## 2023-04-01 DIAGNOSIS — M6281 Muscle weakness (generalized): Secondary | ICD-10-CM | POA: Diagnosis not present

## 2023-04-02 DIAGNOSIS — Z9181 History of falling: Secondary | ICD-10-CM | POA: Diagnosis not present

## 2023-04-02 DIAGNOSIS — M6281 Muscle weakness (generalized): Secondary | ICD-10-CM | POA: Diagnosis not present

## 2023-04-02 DIAGNOSIS — R269 Unspecified abnormalities of gait and mobility: Secondary | ICD-10-CM | POA: Diagnosis not present

## 2023-04-02 DIAGNOSIS — Z4789 Encounter for other orthopedic aftercare: Secondary | ICD-10-CM | POA: Diagnosis not present

## 2023-04-03 DIAGNOSIS — M6281 Muscle weakness (generalized): Secondary | ICD-10-CM | POA: Diagnosis not present

## 2023-04-03 DIAGNOSIS — R269 Unspecified abnormalities of gait and mobility: Secondary | ICD-10-CM | POA: Diagnosis not present

## 2023-04-03 DIAGNOSIS — Z4789 Encounter for other orthopedic aftercare: Secondary | ICD-10-CM | POA: Diagnosis not present

## 2023-04-06 ENCOUNTER — Non-Acute Institutional Stay (SKILLED_NURSING_FACILITY): Payer: Self-pay | Admitting: Orthopedic Surgery

## 2023-04-06 ENCOUNTER — Encounter (HOSPITAL_COMMUNITY): Payer: Self-pay | Admitting: Emergency Medicine

## 2023-04-06 ENCOUNTER — Observation Stay (HOSPITAL_COMMUNITY)
Admission: EM | Admit: 2023-04-06 | Discharge: 2023-04-08 | Disposition: A | Discharge status: Skilled Nursing Facility | Payer: Medicare Other | Attending: Internal Medicine | Admitting: Internal Medicine

## 2023-04-06 ENCOUNTER — Emergency Department (HOSPITAL_COMMUNITY): Payer: Medicare Other

## 2023-04-06 ENCOUNTER — Other Ambulatory Visit: Payer: Self-pay

## 2023-04-06 ENCOUNTER — Encounter: Payer: Self-pay | Admitting: Orthopedic Surgery

## 2023-04-06 DIAGNOSIS — R269 Unspecified abnormalities of gait and mobility: Secondary | ICD-10-CM | POA: Diagnosis not present

## 2023-04-06 DIAGNOSIS — Z9181 History of falling: Secondary | ICD-10-CM | POA: Diagnosis not present

## 2023-04-06 DIAGNOSIS — Z79899 Other long term (current) drug therapy: Secondary | ICD-10-CM | POA: Diagnosis not present

## 2023-04-06 DIAGNOSIS — I480 Paroxysmal atrial fibrillation: Secondary | ICD-10-CM | POA: Diagnosis not present

## 2023-04-06 DIAGNOSIS — M6281 Muscle weakness (generalized): Secondary | ICD-10-CM | POA: Diagnosis not present

## 2023-04-06 DIAGNOSIS — I6310 Cerebral infarction due to embolism of unspecified precerebral artery: Secondary | ICD-10-CM | POA: Diagnosis not present

## 2023-04-06 DIAGNOSIS — M79661 Pain in right lower leg: Secondary | ICD-10-CM | POA: Diagnosis present

## 2023-04-06 DIAGNOSIS — R9089 Other abnormal findings on diagnostic imaging of central nervous system: Secondary | ICD-10-CM | POA: Diagnosis not present

## 2023-04-06 DIAGNOSIS — Z96641 Presence of right artificial hip joint: Secondary | ICD-10-CM | POA: Diagnosis not present

## 2023-04-06 DIAGNOSIS — I6389 Other cerebral infarction: Secondary | ICD-10-CM | POA: Diagnosis not present

## 2023-04-06 DIAGNOSIS — I10 Essential (primary) hypertension: Secondary | ICD-10-CM | POA: Insufficient documentation

## 2023-04-06 DIAGNOSIS — M7989 Other specified soft tissue disorders: Secondary | ICD-10-CM | POA: Diagnosis not present

## 2023-04-06 DIAGNOSIS — E039 Hypothyroidism, unspecified: Secondary | ICD-10-CM | POA: Diagnosis not present

## 2023-04-06 DIAGNOSIS — Z9889 Other specified postprocedural states: Secondary | ICD-10-CM | POA: Diagnosis not present

## 2023-04-06 DIAGNOSIS — Z87891 Personal history of nicotine dependence: Secondary | ICD-10-CM | POA: Diagnosis not present

## 2023-04-06 DIAGNOSIS — Z4789 Encounter for other orthopedic aftercare: Secondary | ICD-10-CM | POA: Diagnosis not present

## 2023-04-06 DIAGNOSIS — I6782 Cerebral ischemia: Secondary | ICD-10-CM | POA: Diagnosis not present

## 2023-04-06 DIAGNOSIS — I6529 Occlusion and stenosis of unspecified carotid artery: Secondary | ICD-10-CM | POA: Diagnosis not present

## 2023-04-06 DIAGNOSIS — I639 Cerebral infarction, unspecified: Secondary | ICD-10-CM | POA: Diagnosis not present

## 2023-04-06 DIAGNOSIS — I700 Atherosclerosis of aorta: Secondary | ICD-10-CM | POA: Diagnosis not present

## 2023-04-06 DIAGNOSIS — M79604 Pain in right leg: Secondary | ICD-10-CM | POA: Diagnosis not present

## 2023-04-06 DIAGNOSIS — I6349 Cerebral infarction due to embolism of other cerebral artery: Principal | ICD-10-CM

## 2023-04-06 DIAGNOSIS — G459 Transient cerebral ischemic attack, unspecified: Secondary | ICD-10-CM | POA: Diagnosis not present

## 2023-04-06 DIAGNOSIS — R4781 Slurred speech: Secondary | ICD-10-CM

## 2023-04-06 DIAGNOSIS — Z7901 Long term (current) use of anticoagulants: Secondary | ICD-10-CM | POA: Insufficient documentation

## 2023-04-06 DIAGNOSIS — R410 Disorientation, unspecified: Secondary | ICD-10-CM | POA: Diagnosis not present

## 2023-04-06 DIAGNOSIS — R609 Edema, unspecified: Secondary | ICD-10-CM | POA: Diagnosis not present

## 2023-04-06 DIAGNOSIS — G319 Degenerative disease of nervous system, unspecified: Secondary | ICD-10-CM | POA: Diagnosis not present

## 2023-04-06 DIAGNOSIS — G9389 Other specified disorders of brain: Secondary | ICD-10-CM | POA: Diagnosis not present

## 2023-04-06 DIAGNOSIS — R4701 Aphasia: Secondary | ICD-10-CM | POA: Diagnosis not present

## 2023-04-06 LAB — CBC
HCT: 37.9 % (ref 36.0–46.0)
Hemoglobin: 11.9 g/dL — ABNORMAL LOW (ref 12.0–15.0)
MCH: 31.9 pg (ref 26.0–34.0)
MCHC: 31.4 g/dL (ref 30.0–36.0)
MCV: 101.6 fL — ABNORMAL HIGH (ref 80.0–100.0)
Platelets: 191 10*3/uL (ref 150–400)
RBC: 3.73 MIL/uL — ABNORMAL LOW (ref 3.87–5.11)
RDW: 13.2 % (ref 11.5–15.5)
WBC: 10.5 10*3/uL (ref 4.0–10.5)
nRBC: 0 % (ref 0.0–0.2)

## 2023-04-06 LAB — DIFFERENTIAL
Abs Immature Granulocytes: 0.06 10*3/uL (ref 0.00–0.07)
Basophils Absolute: 0.1 10*3/uL (ref 0.0–0.1)
Basophils Relative: 1 %
Eosinophils Absolute: 0.1 10*3/uL (ref 0.0–0.5)
Eosinophils Relative: 1 %
Immature Granulocytes: 1 %
Lymphocytes Relative: 13 %
Lymphs Abs: 1.3 10*3/uL (ref 0.7–4.0)
Monocytes Absolute: 0.9 10*3/uL (ref 0.1–1.0)
Monocytes Relative: 9 %
Neutro Abs: 8 10*3/uL — ABNORMAL HIGH (ref 1.7–7.7)
Neutrophils Relative %: 75 %

## 2023-04-06 LAB — PROTIME-INR
INR: 1.1 (ref 0.8–1.2)
Prothrombin Time: 14.2 s (ref 11.4–15.2)

## 2023-04-06 LAB — COMPREHENSIVE METABOLIC PANEL
ALT: 28 U/L (ref 0–44)
AST: 33 U/L (ref 15–41)
Albumin: 3.6 g/dL (ref 3.5–5.0)
Alkaline Phosphatase: 159 U/L — ABNORMAL HIGH (ref 38–126)
Anion gap: 10 (ref 5–15)
BUN: 25 mg/dL — ABNORMAL HIGH (ref 8–23)
CO2: 26 mmol/L (ref 22–32)
Calcium: 9.3 mg/dL (ref 8.9–10.3)
Chloride: 101 mmol/L (ref 98–111)
Creatinine, Ser: 0.94 mg/dL (ref 0.44–1.00)
GFR, Estimated: 58 mL/min — ABNORMAL LOW (ref >60)
Glucose, Bld: 98 mg/dL (ref 70–99)
Potassium: 5 mmol/L (ref 3.5–5.1)
Sodium: 137 mmol/L (ref 135–145)
Total Bilirubin: 0.7 mg/dL (ref 0.0–1.2)
Total Protein: 6.2 g/dL — ABNORMAL LOW (ref 6.5–8.1)

## 2023-04-06 LAB — URINALYSIS, ROUTINE W REFLEX MICROSCOPIC
Bilirubin Urine: NEGATIVE
Glucose, UA: NEGATIVE mg/dL
Hgb urine dipstick: NEGATIVE
Ketones, ur: NEGATIVE mg/dL
Leukocytes,Ua: NEGATIVE
Nitrite: NEGATIVE
Protein, ur: NEGATIVE mg/dL
Specific Gravity, Urine: 1.009 (ref 1.005–1.030)
pH: 7 (ref 5.0–8.0)

## 2023-04-06 LAB — APTT: aPTT: 32 s (ref 24–36)

## 2023-04-06 LAB — CBG MONITORING, ED: Glucose-Capillary: 73 mg/dL (ref 70–99)

## 2023-04-06 MED ORDER — STROKE: EARLY STAGES OF RECOVERY BOOK
Freq: Once | Status: AC
Start: 2023-04-07 — End: 2023-04-07
  Administered 2023-04-07: 1
  Filled 2023-04-06: qty 1

## 2023-04-06 MED ORDER — LEVOTHYROXINE SODIUM 75 MCG PO TABS
75.0000 ug | ORAL_TABLET | Freq: Every day | ORAL | Status: DC
  Administered 2023-04-07 – 2023-04-08 (×2): 75 ug via ORAL
  Filled 2023-04-06 (×2): qty 1

## 2023-04-06 MED ORDER — POLYETHYLENE GLYCOL 3350 17 G PO PACK: 17.0000 g | PACK | Freq: Every day | ORAL | Status: DC | PRN

## 2023-04-06 MED ORDER — ACETAMINOPHEN 325 MG PO TABS: 650.0000 mg | ORAL_TABLET | ORAL | Status: DC | PRN

## 2023-04-06 MED ORDER — ACETAMINOPHEN 325 MG PO TABS
650.0000 mg | ORAL_TABLET | Freq: Every morning | ORAL | Status: DC
  Administered 2023-04-07 – 2023-04-08 (×2): 650 mg via ORAL
  Filled 2023-04-06 (×2): qty 2

## 2023-04-06 MED ORDER — ROSUVASTATIN CALCIUM 5 MG PO TABS
10.0000 mg | ORAL_TABLET | Freq: Every day | ORAL | Status: DC
  Administered 2023-04-06 – 2023-04-07 (×2): 10 mg via ORAL
  Filled 2023-04-06 (×2): qty 2

## 2023-04-06 MED ORDER — LORATADINE 10 MG PO TABS
10.0000 mg | ORAL_TABLET | Freq: Every day | ORAL | Status: DC
  Administered 2023-04-07 – 2023-04-08 (×2): 10 mg via ORAL
  Filled 2023-04-06 (×2): qty 1

## 2023-04-06 MED ORDER — ACETAMINOPHEN 160 MG/5ML PO SOLN: 650.0000 mg | ORAL | Status: DC | PRN

## 2023-04-06 MED ORDER — APIXABAN 2.5 MG PO TABS
2.5000 mg | ORAL_TABLET | Freq: Two times a day (BID) | ORAL | Status: DC
  Administered 2023-04-06 – 2023-04-08 (×4): 2.5 mg via ORAL
  Filled 2023-04-06 (×4): qty 1

## 2023-04-06 MED ORDER — ACETAMINOPHEN 650 MG RE SUPP: 650.0000 mg | RECTAL | Status: DC | PRN

## 2023-04-06 MED ORDER — IOHEXOL 350 MG/ML SOLN
75.0000 mL | Freq: Once | INTRAVENOUS | Status: AC | PRN
  Administered 2023-04-06: 75 mL via INTRAVENOUS

## 2023-04-06 MED ORDER — ESCITALOPRAM OXALATE 10 MG PO TABS
10.0000 mg | ORAL_TABLET | Freq: Every day | ORAL | Status: DC
  Administered 2023-04-07 – 2023-04-08 (×2): 10 mg via ORAL
  Filled 2023-04-06 (×2): qty 1

## 2023-04-06 MED ORDER — APIXABAN 2.5 MG PO TABS: 2.5000 mg | ORAL_TABLET | Freq: Two times a day (BID) | ORAL | Status: DC

## 2023-04-06 NOTE — ED Notes (Signed)
Pt ambulated to bathroom with walker and assistance, tolerated well.

## 2023-04-06 NOTE — Assessment & Plan Note (Addendum)
Switching Xarelto to Eliquis due to embolic strokes despite Xarelto.

## 2023-04-06 NOTE — H&P (Signed)
History and Physical    Patient: Theresa [name]:[id number] DOB: 18-Mar-1931 DOA: 04/06/2023 DOS: the patient was seen and examined on 04/06/2023 PCP: Shelva Majestic, MD  Patient coming from: SNF  Chief Complaint:  Chief Complaint  Patient presents with   Aphasia    Resolved   HPI: Theresa [name] is a 89 y.o. female with medical history significant of a.fib on xarelto, prior stroke, hypothyroidism.  Pt currently resides in rehab following hip surg 03/05/2023.  Pt presents to ED after 2 episodes of slurred speech, difficulty with word finding, confusion earlier today.  Episodes lasted 15-20 mins.  Has R ankle pain since hip surgery.   Review of Systems: As mentioned in the history of present illness. All other systems reviewed and are negative. Past Medical History:  Diagnosis Date   Atrial fibrillation (HCC)    AFIB   Collagenous colitis    CVA (cerebral vascular accident) (HCC)    a.  L parietal CVA by MRI 12/12, likely embolic;  b.  TEE by report with neg bubble study;  c.  carotid dopplers  12/12:  + plaque, no sig ICA stenosis    History of postmenopausal HRT    Hx of adenomatous colonic polyps    Hypothyroidism    MVP (mitral valve prolapse)    a. s/p MV repair at [facility] in 2008;  b. Echocardiogram 7/12: EF 50%, status post mitral valve repair with mild MR and minimal MS, mean gradient 4, moderate LAE, LA diam 55 mm; mild RAE, PASP 28-32;    Osteoarthritis    Osteoporosis    Past Surgical History:  Procedure Laterality Date   ANTERIOR APPROACH HEMI HIP ARTHROPLASTY Right 03/05/2023   Procedure: ANTERIOR APPROACH HEMI HIP ARTHROPLASTY;  Surgeon: Samson Frederic, MD;  Location: [facility] ORS;  Service: Orthopedics;  Laterality: Right;   CARDIOVERSION  01/09/2012   Procedure: CARDIOVERSION;  Surgeon: Dolores Patty, MD;  Location: [facility] ENDOSCOPY;  Service: Cardiovascular;  Laterality: N/A;   CATARACT EXTRACTION     CHOLECYSTECTOMY     DILATION AND CURETTAGE OF  UTERUS     LUMBAR FUSION     MITRAL VALVE REPAIR  03/17/2006   "mitral valve repair CE ring and maze procedure"   TONSILLECTOMY     Social History:  reports that she quit smoking about 56 years ago. Her smoking use included cigarettes. She has never used smokeless tobacco. She reports current alcohol use of about 7.0 standard drinks of alcohol per week. She reports that she does not use drugs.  Allergies  Allergen Reactions   Tape Other (See Comments)    SKIN TEARS AND BRUISES VERY EASILY   Rofecoxib Other (See Comments)    Vioxx- Elevated LFT's  Other Reaction(s): Not available    Family History  Problem Relation Age of Onset   Cancer Father        COLON   Stroke Neg Hx        1ST DEGREE RELATIVE <60    Prior to Admission medications   Medication Sig Start Date End Date Taking? Authorizing Provider  Calcium Citrate-Vitamin D (CITRACAL PETITES/VITAMIN D PO) Take 1 tablet by mouth 2 (two) times daily.   Yes [provider]  docusate sodium (COLACE) 100 MG capsule Take 1 capsule (100 mg total) by mouth 2 (two) times daily. 03/09/23  Yes Rai, Ripudeep K, MD  escitalopram (LEXAPRO) 10 MG tablet Take 1 tablet (10 mg total) by mouth daily. 09/22/22  Yes Shelva Majestic,  MD  fluticasone (FLONASE) 50 MCG/ACT nasal spray Place 2 sprays into both nostrils daily. 02/11/22  Yes Lula Olszewski, MD  glucosamine-chondroitin 500-400 MG tablet Take 1 tablet by mouth 2 (two) times daily.   Yes [provider]  levothyroxine (SYNTHROID) 75 MCG tablet TAKE 1 TABLET BY MOUTH EVERY DAY BEFORE BREAKFAST 12/12/22  Yes Shelva Majestic, MD  loratadine (CLARITIN) 10 MG tablet Take 1 tablet (10 mg total) by mouth daily. 02/11/22  Yes Lula Olszewski, MD  methocarbamol (ROBAXIN) 500 MG tablet Take 1 tablet (500 mg total) by mouth every 8 (eight) hours as needed for muscle spasms. 03/09/23  Yes Rai, Ripudeep K, MD  metoprolol tartrate (LOPRESSOR) 25 MG tablet Take 1 tablet (25 mg total)  by mouth 2 (two) times daily. 03/09/23  Yes Rai, Ripudeep K, MD  Multiple Vitamins-Minerals (PRESERVISION AREDS 2 PO) Take 1 capsule by mouth 2 (two) times daily. Reported on 09/04/2015   Yes [provider]  polyethylene glycol (MIRALAX / GLYCOLAX) 17 g packet Take 17 g by mouth daily as needed for mild constipation. 03/09/23  Yes Rai, Ripudeep K, MD  rosuvastatin (CRESTOR) 10 MG tablet TAKE 1 TABLET BY MOUTH DAILY. GENERIC EQUIVALENT FOR CRESTOR 12/08/22  Yes Shelva Majestic, MD  Saline (SIMPLY SALINE) 0.9 % AERS Place 2 each into the nose daily as needed. 02/11/22  Yes Lula Olszewski, MD  TYLENOL 8 HOUR ARTHRITIS PAIN 650 MG CR tablet Take 650 mg by mouth in the morning.   Yes [provider]  vitamin A 16109 UNIT capsule Take 1 capsule by mouth daily.   Yes [provider]  zinc oxide 20 % ointment Apply 1 Application topically as needed for irritation.   Yes [provider]    Physical Exam: Vitals:   04/06/23 1854 04/06/23 1930 04/06/23 1945 04/06/23 1949  BP: (!) 162/101 (!) 147/85 (!) 165/107   Pulse: (!) 114 91  (!) 107  Resp: 19 15 20 20   Temp: 98.2 F (36.8 C)     TempSrc: Oral     SpO2: 94% 97% 94% 95%  Weight:      Height:       Constitutional: NAD, calm, comfortable Respiratory: clear to auscultation bilaterally, no wheezing, no crackles. Normal respiratory effort. No accessory muscle use.  Cardiovascular: Regular rate and rhythm, no murmurs / rubs / gallops. No extremity edema. 2+ pedal pulses. No carotid bruits.  Abdomen: no tenderness, no masses palpated. No hepatosplenomegaly. Bowel sounds positive.  Neurologic: CN 2-12 grossly intact. Sensation intact, DTR normal. Strength 5/5 in all 4.  Psychiatric: Normal judgment and insight. Alert and oriented x 3. Normal mood.   Data Reviewed:    Labs on Admission: I have personally reviewed following labs and imaging studies  CBC: Recent Labs  Lab 04/06/23 1326  WBC 10.5  NEUTROABS  8.0*  HGB 11.9*  HCT 37.9  MCV 101.6*  PLT 191   Basic Metabolic Panel: Recent Labs  Lab 04/06/23 1326  NA 137  K 5.0  CL 101  CO2 26  GLUCOSE 98  BUN 25*  CREATININE 0.94  CALCIUM 9.3   GFR: Estimated Creatinine Clearance: 26.1 mL/min (by C-G formula based on SCr of 0.94 mg/dL). Liver Function Tests: Recent Labs  Lab 04/06/23 1326  AST 33  ALT 28  ALKPHOS 159*  BILITOT 0.7  PROT 6.2*  ALBUMIN 3.6   No results for input(s): "LIPASE", "AMYLASE" in the last 168 hours. No results for input(s): "  AMMONIA" in the last 168 hours. Coagulation Profile: Recent Labs  Lab 04/06/23 1326  INR 1.1   Cardiac Enzymes: No results for input(s): "CKTOTAL", "CKMB", "CKMBINDEX", "TROPONINI" in the last 168 hours. BNP (last 3 results) No results for input(s): "PROBNP" in the last 8760 hours. HbA1C: No results for input(s): "HGBA1C" in the last 72 hours. CBG: Recent Labs  Lab 04/06/23 1317  GLUCAP 73   Lipid Profile: No results for input(s): "CHOL", "HDL", "LDLCALC", "TRIG", "CHOLHDL", "LDLDIRECT" in the last 72 hours. Thyroid Function Tests: No results for input(s): "TSH", "T4TOTAL", "FREET4", "T3FREE", "THYROIDAB" in the last 72 hours. Anemia Panel: No results for input(s): "VITAMINB12", "FOLATE", "FERRITIN", "TIBC", "IRON", "RETICCTPCT" in the last 72 hours. Urine analysis:    Component Value Date/Time   COLORURINE STRAW (A) 04/06/2023 1338   APPEARANCEUR CLEAR 04/06/2023 1338   LABSPEC 1.009 04/06/2023 1338   PHURINE 7.0 04/06/2023 1338   GLUCOSEU NEGATIVE 04/06/2023 1338   HGBUR NEGATIVE 04/06/2023 1338   HGBUR negative 12/19/2009 0945   BILIRUBINUR NEGATIVE 04/06/2023 1338   BILIRUBINUR 2+ 01/13/2013 1027   KETONESUR NEGATIVE 04/06/2023 1338   PROTEINUR NEGATIVE 04/06/2023 1338   UROBILINOGEN 0.2 03/21/2013 1242   NITRITE NEGATIVE 04/06/2023 1338   LEUKOCYTESUR NEGATIVE 04/06/2023 1338    Radiological Exams on Admission: CT ANGIO HEAD NECK W WO CM Result  Date: 04/06/2023 CLINICAL DATA:  Follow-up examination for stroke. EXAM: CT ANGIOGRAPHY HEAD AND NECK WITH AND WITHOUT CONTRAST TECHNIQUE: Multidetector CT imaging of the head and neck was performed using the standard protocol during bolus administration of intravenous contrast. Multiplanar CT image reconstructions and MIPs were obtained to evaluate the vascular anatomy. Carotid stenosis measurements (when applicable) are obtained utilizing NASCET criteria, using the distal internal carotid diameter as the denominator. RADIATION DOSE REDUCTION: This exam was performed according to the departmental dose-optimization program which includes automated exposure control, adjustment of the mA and/or kV according to patient size and/or use of iterative reconstruction technique. CONTRAST:  75mL OMNIPAQUE IOHEXOL 350 MG/ML SOLN COMPARISON:  Prior brain MRI from earlier the same day. FINDINGS: CT HEAD FINDINGS Brain: Moderately advanced cerebral atrophy with mild chronic small vessel ischemic disease. Chronic left frontal lobe infarct. Few additional scattered small remote bilateral cerebellar infarcts. Previously identified subcentimeter ischemic infarcts not visible by CT. No other acute large vessel territory infarct. No acute intracranial hemorrhage. No mass lesion or midline shift. No hydrocephalus or extra-axial fluid collection. Vascular: No abnormal hyperdense vessel. Scattered vascular calcifications noted within the carotid siphons. Skull: Scalp soft tissues within normal limits.  Calvarium intact. Sinuses/Orbits: Globes and orbital soft tissues within normal limits. Paranasal sinuses and mastoid air cells are clear. Other: None. Review of the MIP images confirms the above findings CTA NECK FINDINGS Aortic arch: Visualized aortic arch within normal limits for caliber with standard branch pattern. Mild aortic atherosclerosis. No significant stenosis about the origin of the great vessels. Right carotid system: Right  common and internal carotid arteries are patent without stenosis or dissection. Left carotid system: Left common and internal carotid arteries are patent without stenosis or dissection. Vertebral arteries: Both vertebral arteries arise from subclavian arteries. No proximal subclavian artery stenosis. Right vertebral artery dominant with a diffusely hypoplastic left vertebral artery. Vertebral arteries are patent without stenosis or dissection. Skeleton: Reversal of the normal cervical lordosis with advanced cervical spondylosis. Few probable benign hemangiomata noted. Other neck: No other acute finding. Upper chest: Small layering right pleural effusion. No other acute finding. Review of the MIP images confirms  the above findings CTA HEAD FINDINGS Anterior circulation: Mild atheromatous change about the carotid siphons without hemodynamically significant stenosis. A1 segments patent bilaterally. Right A1 diminutive. Normal anterior communicating artery complex. Anterior cerebral arteries patent without significant stenosis. No M1 stenosis or occlusion. Distal MCA branches perfused and symmetric. Posterior circulation: Dominant right V4 segment widely patent. Hypoplastic left vertebral artery largely terminates in PICA. Both PICA patent. Basilar patent without stenosis. Superior cerebellar and posterior cerebral arteries widely patent bilaterally. Venous sinuses: Grossly patent allowing for timing the contrast bolus. Anatomic variants: As above.  No aneurysm. Review of the MIP images confirms the above findings IMPRESSION: CT HEAD: 1. No acute intracranial abnormality. Previously identified subcentimeter ischemic infarcts not visible by CT. 2. Underlying moderately advanced cerebral atrophy with mild chronic small vessel ischemic disease, with chronic left frontal lobe infarct. Few additional scattered small remote bilateral cerebellar infarcts. CTA HEAD AND NECK: 1. Negative CTA for large vessel occlusion or other  emergent finding. 2. Mild for age atheromatous disease without hemodynamically significant stenosis. 3.  Aortic Atherosclerosis (ICD10-I70.0). 4. Small layering right pleural effusion. Electronically Signed   By: Rise Mu M.D.   On: 04/06/2023 20:05   MR BRAIN WO CONTRAST Result Date: 04/06/2023 CLINICAL DATA:  Transient ischemic attack. EXAM: MRI HEAD WITHOUT CONTRAST TECHNIQUE: Multiplanar, multiecho pulse sequences of the brain and surrounding structures were obtained without intravenous contrast. COMPARISON:  MRI of the brain September 21, 2019. FINDINGS: The study was terminated prematurely due to patient inability to lie still in the scanner. Some of the images obtained are degraded by motion. Brain: Multiple small foci of restricted diffusion are seen in the superior left cerebellar hemisphere, left occipital lobe, bilateral frontal lobes and right parietal lobe, consistent with acute infarcts, likely embolic. No hemorrhage, hydrocephalus, extra-axial collection or mass lesion. Area of encephalomalacia and gliosis in the left frontoparietal region. Multiple small chronic infarcts in the bilateral cerebellar hemispheres. Moderate parenchymal volume loss. Vascular: Normal flow voids. Skull and upper cervical spine: Grossly unremarkable. Sinuses/Orbits: Negative. Other: None. IMPRESSION: 1. Multiple small foci of restricted diffusion in the superior left cerebellar hemisphere, left occipital lobe, bilateral frontal lobes and right parietal lobe, consistent with acute infarcts, likely embolic. 2. Area of encephalomalacia and gliosis in the left frontoparietal region. 3. Multiple small chronic infarcts in the bilateral cerebellar hemispheres. 4. Moderate parenchymal volume loss. Electronically Signed   By: Baldemar Lenis M.D.   On: 04/06/2023 15:48    EKG: Independently reviewed.   Assessment and Plan: * Acute embolic stroke (HCC) Acute embolic strokes from a.fib, despite  xarelto. Stroke pathway Neuro consulting Tele monitor Switch xarelto to eliquis, first dose tonight per Dr. Otelia Limes instructions. PT/OT/SLP Dr. Roda Shutters going to weigh in on pt in AM Cont crestor Getting [place] of RLE for pain / swelling to r/o DVT  Pain and swelling of right lower leg Since hip surg last month. Checking [place] to r/o DVT  Paroxysmal atrial fibrillation (HCC) Switching Xarelto to Eliquis due to embolic strokes despite Xarelto.  History of mitral valve repair "Mitral valve repair CE ring and maze procedure" 06/15/2006 at [facility].      Advance Care Planning:   Code Status: Full Code  Consults: Neuro  Family Communication: No family in room  Severity of Illness: The appropriate patient status for this patient is OBSERVATION. Observation status is judged to be reasonable and necessary in order to provide the required intensity of service to ensure the patient's safety. The patient's presenting symptoms,  physical exam findings, and initial radiographic and laboratory data in the context of their medical condition is felt to place them at decreased risk for further clinical deterioration. Furthermore, it is anticipated that the patient will be medically stable for discharge from the hospital within 2 midnights of admission.   Author: Hillary Bow., DO 04/06/2023 9:51 PM  For on call review [email or web address].

## 2023-04-06 NOTE — ED Notes (Addendum)
Pt ambulated to bathroom with walker (baseline) and tolerated well - during this activity experienced episode of aphasia. Upon return to room, aphasia largely resolved with some sporadic difficulty with word finding. Pt able to perform NIH assessment at time of this episode with score of 0. Episode communicated to ED PA by [place], RN. UA and culture sent down to lab.

## 2023-04-06 NOTE — Assessment & Plan Note (Signed)
"  Mitral valve repair CE ring and maze procedure" 06/15/2006 at [facility].

## 2023-04-06 NOTE — ED Notes (Signed)
Patient transported to MRI 

## 2023-04-06 NOTE — Consult Note (Signed)
NEURO HOSPITALIST CONSULT NOTE   Requestig physician: Dr. Julian Reil  Reason for Consult: Multifocal strokes on MRI  History obtained from:  Patient and Chart     HPI:                                                                                                                                          Theresa [name] is an 89 y.o. female with a PMHx of atrial fibrillation (on Xarelto), collagenous colitis, HTN, HLD, left parietal stroke in 2015, postmenopausal HRT, hypothyroidism, mitral valve repair in 2008, osteoarthritis, osteoporosis and recent right hip fracture s/p hemi hip arthroplasty who presents to the ED with 3 episodes of transient confusion / speech difficulty today. MRI brain reveals multifocal punctate acute ischemic strokes in separate vascular territories.   Past Medical History:  Diagnosis Date   Atrial fibrillation (HCC)    AFIB   Collagenous colitis    CVA (cerebral vascular accident) (HCC)    a.  L parietal CVA by MRI 12/12, likely embolic;  b.  TEE by report with neg bubble study;  c.  carotid dopplers  12/12:  + plaque, no sig ICA stenosis    History of postmenopausal HRT    Hx of adenomatous colonic polyps    Hypothyroidism    MVP (mitral valve prolapse)    a. s/p MV repair at [facility] in 2008;  b. Echocardiogram 7/12: EF 50%, status post mitral valve repair with mild MR and minimal MS, mean gradient 4, moderate LAE, LA diam 55 mm; mild RAE, PASP 28-32;    Osteoarthritis    Osteoporosis     Past Surgical History:  Procedure Laterality Date   ANTERIOR APPROACH HEMI HIP ARTHROPLASTY Right 03/05/2023   Procedure: ANTERIOR APPROACH HEMI HIP ARTHROPLASTY;  Surgeon: Samson Frederic, MD;  Location: [facility] ORS;  Service: Orthopedics;  Laterality: Right;   CARDIOVERSION  01/09/2012   Procedure: CARDIOVERSION;  Surgeon: Dolores Patty, MD;  Location: [facility] ENDOSCOPY;  Service: Cardiovascular;  Laterality: N/A;   CATARACT EXTRACTION     CHOLECYSTECTOMY      DILATION AND CURETTAGE OF UTERUS     LUMBAR FUSION     MITRAL VALVE REPAIR  2008   TONSILLECTOMY      Family History  Problem Relation Age of Onset   Cancer Father        COLON   Stroke Neg Hx        1ST DEGREE RELATIVE <60              Social History:  reports that she quit smoking about 56 years ago. Her smoking use included cigarettes. She has never used smokeless tobacco. She reports current alcohol use of about 7.0 standard drinks of alcohol per week. She reports that she  does not use drugs.  Allergies  Allergen Reactions   Tape Other (See Comments)    SKIN TEARS AND BRUISES VERY EASILY   Rofecoxib Other (See Comments)    Vioxx- Elevated LFT's  Other Reaction(s): Not available    HOME MEDICATIONS:                                                                                                                      No current facility-administered medications on file prior to encounter.   Current Outpatient Medications on File Prior to Encounter  Medication Sig Dispense Refill   Calcium Citrate-Vitamin D (CITRACAL PETITES/VITAMIN D PO) Take 1 tablet by mouth 2 (two) times daily.     docusate sodium (COLACE) 100 MG capsule Take 1 capsule (100 mg total) by mouth 2 (two) times daily.     escitalopram (LEXAPRO) 10 MG tablet Take 1 tablet (10 mg total) by mouth daily. 90 tablet 3   fluticasone (FLONASE) 50 MCG/ACT nasal spray Place 2 sprays into both nostrils daily. 16 g 6   glucosamine-chondroitin 500-400 MG tablet Take 1 tablet by mouth 2 (two) times daily.     levothyroxine (SYNTHROID) 75 MCG tablet TAKE 1 TABLET BY MOUTH EVERY DAY BEFORE BREAKFAST 90 tablet 3   loratadine (CLARITIN) 10 MG tablet Take 1 tablet (10 mg total) by mouth daily. 30 tablet 11   methocarbamol (ROBAXIN) 500 MG tablet Take 1 tablet (500 mg total) by mouth every 8 (eight) hours as needed for muscle spasms.     metoprolol tartrate (LOPRESSOR) 25 MG tablet Take 1 tablet (25 mg total) by mouth 2 (two)  times daily.     Multiple Vitamins-Minerals (PRESERVISION AREDS 2 PO) Take 1 capsule by mouth 2 (two) times daily. Reported on 09/04/2015     polyethylene glycol (MIRALAX / GLYCOLAX) 17 g packet Take 17 g by mouth daily as needed for mild constipation.     Rivaroxaban (XARELTO) 15 MG TABS tablet Take 1 tablet (15 mg total) by mouth daily with supper. 90 tablet 3   rosuvastatin (CRESTOR) 10 MG tablet TAKE 1 TABLET BY MOUTH DAILY. GENERIC EQUIVALENT FOR CRESTOR 90 tablet 3   Saline (SIMPLY SALINE) 0.9 % AERS Place 2 each into the nose daily as needed. 500 mL 1   TYLENOL 8 HOUR ARTHRITIS PAIN 650 MG CR tablet Take 650 mg by mouth in the morning.     vitamin A 16109 UNIT capsule Take 1 capsule by mouth daily.     zinc oxide 20 % ointment Apply 1 Application topically as needed for irritation.       ROS:  Comprehensive ROS performed with pertinent positives as documented in the HPI.    Blood pressure (!) 165/107, pulse (!) 107, temperature 98.2 F (36.8 C), temperature source Oral, resp. rate 20, height 5' (1.524 m), weight 40.8 kg, SpO2 95%.   General Examination:                                                                                                       Physical Exam  HEENT-  [place]/AT    Lungs- Respirations unlabored Extremities- No edema  Neurological Examination Mental Status: Alert, oriented x 5, thought content appropriate.  Speech fluent without evidence of aphasia.  Able to follow all commands without difficulty. Cranial Nerves: II: Temporal visual fields intact with no extinction to DSS. PERRL  III,IV, VI: No ptosis. EOMI.  V: Temp sensation equal bilaterally  VII: Mild right facial droop VIII: Hearing intact to voice IX,X: No hoarseness XI: Symmetric shoulder shrug XII: Midline tongue extension Motor: LUE: Somewhat compromised by  inability to fully extend left elbow due to childhood injury. In this context, no focal UMN or LMN pattern of weakness is noted.  RUE 5/5 LLE 5/5 proximally and distally RLE 4+/5 proximal strength in the context of recent right hip surgery after fracture. Left ADF/APF 4/5  No pronator drift.  Sensory: Decreased temp sensation to lateral aspect of right leg. Otherwise with normal sensation x 4. No extinction to DSS.  Deep Tendon Reflexes: Bilateral brachioradialis and biceps 1+. Trace right patellar, 2+ left patellar. 0 bilateral achilles.  Cerebellar: No ataxia with FNF bilaterally. Mild action tremor on the right.   Gait: Deferred    Lab Results: Basic Metabolic Panel: Recent Labs  Lab 04/06/23 1326  NA 137  K 5.0  CL 101  CO2 26  GLUCOSE 98  BUN 25*  CREATININE 0.94  CALCIUM 9.3    CBC: Recent Labs  Lab 04/06/23 1326  WBC 10.5  NEUTROABS 8.0*  HGB 11.9*  HCT 37.9  MCV 101.6*  PLT 191    Cardiac Enzymes: No results for input(s): "CKTOTAL", "CKMB", "CKMBINDEX", "TROPONINI" in the last 168 hours.  Lipid Panel: No results for input(s): "CHOL", "TRIG", "HDL", "CHOLHDL", "VLDL", "LDLCALC" in the last 168 hours.  Imaging: CT ANGIO HEAD NECK W WO CM Result Date: 04/06/2023 CLINICAL DATA:  Follow-up examination for stroke. EXAM: CT ANGIOGRAPHY HEAD AND NECK WITH AND WITHOUT CONTRAST TECHNIQUE: Multidetector CT imaging of the head and neck was performed using the standard protocol during bolus administration of intravenous contrast. Multiplanar CT image reconstructions and MIPs were obtained to evaluate the vascular anatomy. Carotid stenosis measurements (when applicable) are obtained utilizing NASCET criteria, using the distal internal carotid diameter as the denominator. RADIATION DOSE REDUCTION: This exam was performed according to the departmental dose-optimization program which includes automated exposure control, adjustment of the mA and/or kV according to patient size  and/or use of iterative reconstruction technique. CONTRAST:  75mL OMNIPAQUE IOHEXOL 350 MG/ML SOLN COMPARISON:  Prior brain MRI from earlier the same day. FINDINGS: CT HEAD FINDINGS Brain: Moderately advanced cerebral atrophy with mild chronic small  vessel ischemic disease. Chronic left frontal lobe infarct. Few additional scattered small remote bilateral cerebellar infarcts. Previously identified subcentimeter ischemic infarcts not visible by CT. No other acute large vessel territory infarct. No acute intracranial hemorrhage. No mass lesion or midline shift. No hydrocephalus or extra-axial fluid collection. Vascular: No abnormal hyperdense vessel. Scattered vascular calcifications noted within the carotid siphons. Skull: Scalp soft tissues within normal limits.  Calvarium intact. Sinuses/Orbits: Globes and orbital soft tissues within normal limits. Paranasal sinuses and mastoid air cells are clear. Other: None. Review of the MIP images confirms the above findings CTA NECK FINDINGS Aortic arch: Visualized aortic arch within normal limits for caliber with standard branch pattern. Mild aortic atherosclerosis. No significant stenosis about the origin of the great vessels. Right carotid system: Right common and internal carotid arteries are patent without stenosis or dissection. Left carotid system: Left common and internal carotid arteries are patent without stenosis or dissection. Vertebral arteries: Both vertebral arteries arise from subclavian arteries. No proximal subclavian artery stenosis. Right vertebral artery dominant with a diffusely hypoplastic left vertebral artery. Vertebral arteries are patent without stenosis or dissection. Skeleton: Reversal of the normal cervical lordosis with advanced cervical spondylosis. Few probable benign hemangiomata noted. Other neck: No other acute finding. Upper chest: Small layering right pleural effusion. No other acute finding. Review of the MIP images confirms the above  findings CTA HEAD FINDINGS Anterior circulation: Mild atheromatous change about the carotid siphons without hemodynamically significant stenosis. A1 segments patent bilaterally. Right A1 diminutive. Normal anterior communicating artery complex. Anterior cerebral arteries patent without significant stenosis. No M1 stenosis or occlusion. Distal MCA branches perfused and symmetric. Posterior circulation: Dominant right V4 segment widely patent. Hypoplastic left vertebral artery largely terminates in PICA. Both PICA patent. Basilar patent without stenosis. Superior cerebellar and posterior cerebral arteries widely patent bilaterally. Venous sinuses: Grossly patent allowing for timing the contrast bolus. Anatomic variants: As above.  No aneurysm. Review of the MIP images confirms the above findings IMPRESSION: CT HEAD: 1. No acute intracranial abnormality. Previously identified subcentimeter ischemic infarcts not visible by CT. 2. Underlying moderately advanced cerebral atrophy with mild chronic small vessel ischemic disease, with chronic left frontal lobe infarct. Few additional scattered small remote bilateral cerebellar infarcts. CTA HEAD AND NECK: 1. Negative CTA for large vessel occlusion or other emergent finding. 2. Mild for age atheromatous disease without hemodynamically significant stenosis. 3.  Aortic Atherosclerosis (ICD10-I70.0). 4. Small layering right pleural effusion. Electronically Signed   By: Rise Mu M.D.   On: 04/06/2023 20:05   MR BRAIN WO CONTRAST Result Date: 04/06/2023 CLINICAL DATA:  Transient ischemic attack. EXAM: MRI HEAD WITHOUT CONTRAST TECHNIQUE: Multiplanar, multiecho pulse sequences of the brain and surrounding structures were obtained without intravenous contrast. COMPARISON:  MRI of the brain September 21, 2019. FINDINGS: The study was terminated prematurely due to patient inability to lie still in the scanner. Some of the images obtained are degraded by motion. Brain:  Multiple small foci of restricted diffusion are seen in the superior left cerebellar hemisphere, left occipital lobe, bilateral frontal lobes and right parietal lobe, consistent with acute infarcts, likely embolic. No hemorrhage, hydrocephalus, extra-axial collection or mass lesion. Area of encephalomalacia and gliosis in the left frontoparietal region. Multiple small chronic infarcts in the bilateral cerebellar hemispheres. Moderate parenchymal volume loss. Vascular: Normal flow voids. Skull and upper cervical spine: Grossly unremarkable. Sinuses/Orbits: Negative. Other: None. IMPRESSION: 1. Multiple small foci of restricted diffusion in the superior left cerebellar hemisphere, left occipital lobe, bilateral frontal  lobes and right parietal lobe, consistent with acute infarcts, likely embolic. 2. Area of encephalomalacia and gliosis in the left frontoparietal region. 3. Multiple small chronic infarcts in the bilateral cerebellar hemispheres. 4. Moderate parenchymal volume loss. Electronically Signed   By: Baldemar Lenis M.D.   On: 04/06/2023 15:48    Assessment: 89 year old female with a PMHx of atrial fibrillation (on Xarelto), collagenous colitis, HTN, HLD, left parietal stroke in 2015, postmenopausal HRT, hypothyroidism, mitral valve repair in 2008, osteoarthritis, osteoporosis and recent right hip fracture s/p hemi hip arthroplasty who presents to the ED with 3 episodes of transient confusion / speech difficulty today. MRI brain reveals multifocal punctate acute ischemic strokes in separate vascular territories.  - Exam reveals mild right facial droop and mild RLE weakness. No aphasia noted.  - MRI brain: Several punctate/small foci of restricted diffusion are seen: one in the superior left cerebellar hemisphere, one in the left occipital lobe, the bilateral frontal lobes and right parietal lobe, consistent with acute infarcts, likely embolic. Area of encephalomalacia and gliosis in the left  frontoparietal region is also noted. Multiple small chronic infarcts in the bilateral cerebellar hemispheres. Moderate parenchymal volume loss. - CTA head and neck: Negative CTA for large vessel occlusion or other emergent finding. Mild for age atheromatous disease without hemodynamically significant stenosis. Aortic atherosclerosis  - Most likely etiology for her multifocal punctate acute strokes is her atrial fibrillation. She has been compliant with her Xarelto and therefore should be switched to an alternate anticoagulant.  - Also noted on exam is right lower extremity swelling below the knee, sparing the foot. She also endorses discomfort along the lateral aspect of the leg. Warm to touch but no redness noted. States swelling started 1-2 weeks ago. DDx includes DVT.     Recommendations: - Switch Xarelto to Eliquis as she has failed Xarelto.  - Regarding Eliquis, she should get the 2.5 mg PO BID dosing regimen due to having 2/3 indications for the smaller dose (age and weight) - RLE ultrasound to assess for possible DVT - May benefit from percutaneous left atrial appendage closure given her acute embolic strokes in the setting A.fib despite DOAC therapy. Stroke Team to weigh in tomorrow morning.  - Continue rosuvastatin - Modified permissive HTN protocol given advanced age. Treat SBP if > 180.  - TTE - Cardiac telemetry - HgbA1c, fasting lipid panel - PT consult, OT consult, Speech consult - Risk factor modification - Frequent neuro checks - NPO until passes stroke swallow screen - Stroke Team to follow in the AM    Electronically signed: Dr. Caryl Pina 04/06/2023, 9:22 PM

## 2023-04-06 NOTE — Assessment & Plan Note (Signed)
Acute embolic strokes from a.fib, despite xarelto. Stroke pathway Neuro consulting Tele monitor Switch xarelto to eliquis, first dose tonight per Dr. Otelia Limes instructions. PT/OT/SLP Dr. Roda Shutters going to weigh in on pt in AM Cont crestor Getting [place] of RLE for pain / swelling to r/o DVT

## 2023-04-06 NOTE — Progress Notes (Signed)
Location:  [facility] Room Number: 9/A Place of Service:  SNF [phone]) Provider:  Octavia Heir, NP   Shelva Majestic, MD  Patient Care Team: Shelva Majestic, MD as PCP - General (Family Medicine) Wendall Stade, MD as PCP - Cardiology (Cardiology) Mckinley Jewel, MD as Consulting Physician (Ophthalmology) Mitzi Davenport, Minerva Fester, MD as Referring Physician (Ophthalmology) Huston Foley, MD as Consulting Physician (Neurology) Erroll Luna, [facility] (Inactive) as Pharmacist (Pharmacist)  Extended Emergency Contact Information Primary Emergency Contact: Lee,Robert Address: [address]          [place], [place] [address] [place] of [place] Home Phone: [phone] Relation: Spouse Secondary Emergency Contact: McGinnis,Eileen Mobile Phone: [phone] Relation: Other  Code Status:  DNR Goals of care: Advanced Directive information    03/12/2023    9:56 AM  Advanced Directives  Does Patient Have a Medical Advance Directive? Yes  Type of Estate agent of [place];Living will  Does patient want to make changes to medical advance directive? No - Patient declined  Copy of Healthcare Power of Attorney in Chart? Yes - validated most recent copy scanned in chart (See row information)     Chief Complaint  Patient presents with   Acute Visit    Slurred speech    HPI:  Pt is a 89 y.o. female seen today for acute visit due to slurred speech and confusion.   She currently resides on the skilled nursing unit at [facility]. PMH: left MCA stroke 2015, TIA, atrial fib, MVR, HTN, HLD, Raynaud's, hypothyroidism, macular degeneration, GERD, CKD, and depression.   H/o right femoral neck fracture 03/03/2023, MCA stroke 2015, TIA, atrial fibrillation. This morning around 11 am she was noted to have slurred speech and increased confusion. She had just completed PT when symptoms began. Nursing reports elevated HR of 135 and BP of 142/88  earlier this morning. She was given metoprolol as prescribed. HR improved to 88 and BP 147/93. She is unable to tell me where she is or year. Her answers are delayed. Appears to be word finding. She is able to move all extremities. No recent falls or injuries.      Past Medical History:  Diagnosis Date   Atrial fibrillation (HCC)    AFIB   Collagenous colitis    CVA (cerebral vascular accident) (HCC)    a.  L parietal CVA by MRI 12/12, likely embolic;  b.  TEE by report with neg bubble study;  c.  carotid dopplers  12/12:  + plaque, no sig ICA stenosis    History of postmenopausal HRT    Hx of adenomatous colonic polyps    Hypothyroidism    MVP (mitral valve prolapse)    a. s/p MV repair at [facility] in 2008;  b. Echocardiogram 7/12: EF 50%, status post mitral valve repair with mild MR and minimal MS, mean gradient 4, moderate LAE, LA diam 55 mm; mild RAE, PASP 28-32;    Osteoarthritis    Osteoporosis    Past Surgical History:  Procedure Laterality Date   ANTERIOR APPROACH HEMI HIP ARTHROPLASTY Right 03/05/2023   Procedure: ANTERIOR APPROACH HEMI HIP ARTHROPLASTY;  Surgeon: Samson Frederic, MD;  Location: [facility] ORS;  Service: Orthopedics;  Laterality: Right;   CARDIOVERSION  01/09/2012   Procedure: CARDIOVERSION;  Surgeon: Dolores Patty, MD;  Location: [facility] ENDOSCOPY;  Service: Cardiovascular;  Laterality: N/A;   CATARACT EXTRACTION     CHOLECYSTECTOMY     DILATION AND CURETTAGE  OF UTERUS     LUMBAR FUSION     MITRAL VALVE REPAIR  2008   TONSILLECTOMY      Allergies  Allergen Reactions   Tape Other (See Comments)    SKIN TEARS AND BRUISES VERY EASILY   Rofecoxib Other (See Comments)    Vioxx- Elevated LFT's    Outpatient Encounter Medications as of 04/06/2023  Medication Sig   Calcium Citrate-Vitamin D (CITRACAL PETITES/VITAMIN D PO) Take 1 tablet by mouth 2 (two) times daily.   docusate sodium (COLACE) 100 MG capsule Take 1 capsule (100 mg total) by mouth 2 (two) times daily.    escitalopram (LEXAPRO) 10 MG tablet Take 1 tablet (10 mg total) by mouth daily.   fluticasone (FLONASE) 50 MCG/ACT nasal spray Place 2 sprays into both nostrils daily.   glucosamine-chondroitin 500-400 MG tablet Take 1 tablet by mouth 2 (two) times daily.   levothyroxine (SYNTHROID) 75 MCG tablet TAKE 1 TABLET BY MOUTH EVERY DAY BEFORE BREAKFAST   loratadine (CLARITIN) 10 MG tablet Take 1 tablet (10 mg total) by mouth daily.   methocarbamol (ROBAXIN) 500 MG tablet Take 1 tablet (500 mg total) by mouth every 8 (eight) hours as needed for muscle spasms.   metoprolol tartrate (LOPRESSOR) 25 MG tablet Take 1 tablet (25 mg total) by mouth 2 (two) times daily.   Multiple Vitamins-Minerals (PRESERVISION AREDS 2 PO) Take 1 capsule by mouth 2 (two) times daily. Reported on 09/04/2015   polyethylene glycol (MIRALAX / GLYCOLAX) 17 g packet Take 17 g by mouth daily as needed for mild constipation.   Rivaroxaban (XARELTO) 15 MG TABS tablet Take 1 tablet (15 mg total) by mouth daily with supper.   rosuvastatin (CRESTOR) 10 MG tablet TAKE 1 TABLET BY MOUTH DAILY. GENERIC EQUIVALENT FOR CRESTOR   Saline (SIMPLY SALINE) 0.9 % AERS Place 2 each into the nose daily as needed.   TYLENOL 8 HOUR ARTHRITIS PAIN 650 MG CR tablet Take 650 mg by mouth in the morning.   vitamin A 16109 UNIT capsule Take 1 capsule by mouth daily.   zinc oxide 20 % ointment Apply 1 Application topically as needed for irritation.   No facility-administered encounter medications on file as of 04/06/2023.    Review of Systems  Unable to perform ROS: Mental status change    Immunization History  Administered Date(s) Administered   Fluad Quad(high Dose 65+) 11/30/2018, 01/17/2020   Fluad Trivalent(High Dose 65+) 03/02/2023   Influenza Split 12/17/2010, 12/02/2011   Influenza Whole 03/17/2001, 12/18/2006, 12/28/2007, 01/03/2009, 12/26/2009   Influenza, High Dose Seasonal PF 12/22/2012, 03/06/2015, 11/26/2015, 12/09/2017    Influenza,inj,Quad PF,6+ Mos 11/23/2013   Influenza-Unspecified 11/20/2016   PFIZER(Purple Top)SARS-COV-2 Vaccination 06/09/2019, 06/30/2019, 02/16/2020   Pneumococcal Conjugate-13 03/02/2014   Pneumococcal Polysaccharide-23 03/17/2000, 09/16/2006   Td 03/18/1995, 09/15/2006, 02/22/2021   Tdap 12/17/2010   Unspecified SARS-COV-2 Vaccination 12/08/2020   Zoster Recombinant(Shingrix) 09/18/2016, 11/20/2016   Pertinent  Health Maintenance Due  Topic Date Due   INFLUENZA VACCINE  Completed   DEXA SCAN  Completed      12/12/2020    3:12 PM 11/25/2021   10:46 AM 02/11/2022   11:26 AM 05/26/2022   11:09 AM 03/02/2023    3:17 PM  Fall Risk  Falls in the past year? 0 0 1 1 1   Was there an injury with Fall? 0 0 1 1 0  Was there an injury with Fall? - Comments   fractured rib    Fall Risk Category Calculator 0 0  3 3 2   Fall Risk Category (Retired) Low Low High    (RETIRED) Patient Fall Risk Level Low fall risk Low fall risk Low fall risk    Patient at Risk for Falls Due to No Fall Risks No Fall Risks No Fall Risks History of fall(s) Impaired balance/gait;Impaired mobility;History of fall(s)  Fall risk Follow up  Falls evaluation completed Falls evaluation completed Falls evaluation completed Falls prevention discussed   Functional Status Survey:    Vitals:   04/06/23 1217  BP: (!) 142/88  Pulse: 99  Resp: 19  Temp: 98 F (36.7 C)  SpO2: 97%  Weight: 95 lb 9.6 oz (43.4 kg)  Height: 5' (1.524 m)   Body mass index is 18.67 kg/m. Physical Exam Vitals reviewed.  Constitutional:      General: She is not in acute distress. HENT:     Head: Normocephalic.     Mouth/Throat:     Comments: Smile symmetrical Eyes:     General:        Right eye: No discharge.        Left eye: No discharge.     Pupils: Pupils are equal, round, and reactive to light.     Comments: Right eye gaze  Cardiovascular:     Rate and Rhythm: Tachycardia present. Rhythm irregular.     Heart sounds: Normal  heart sounds.  Abdominal:     General: Bowel sounds are normal.     Palpations: Abdomen is soft.  Musculoskeletal:     Cervical back: Neck supple.     Right lower leg: No edema.     Left lower leg: No edema.     Comments: Able to move extremities  Skin:    General: Skin is warm.     Capillary Refill: Capillary refill takes less than 2 seconds.  Neurological:     Mental Status: She is alert. She is disoriented.     Comments: Bilateral hand grips equal  Psychiatric:        Mood and Affect: Mood normal.     Comments: Delayed responses, word finding, alert to self/familiar face     Labs reviewed: Recent Labs    03/05/23 0748 03/06/23 0337 03/07/23 0352 03/08/23 0352  NA 136 130* 133* 139  K 4.0 4.0 4.6 4.4  CL 102 97* 98 103  CO2 27 26 29 30   GLUCOSE 133* 154* 131* 95  BUN 22 25* 34* 27*  CREATININE 0.60 0.93 0.89 0.64  CALCIUM 8.2* 7.7* 8.5* 8.6*  MG 2.1  --  2.1 2.2  PHOS 1.9* 3.1 1.6* 3.1   Recent Labs    05/26/22 1200 03/03/23 2200 03/05/23 0748 03/06/23 0337 03/07/23 0352 03/08/23 0352  AST 28 29  --   --   --   --   ALT 36* 42  --   --   --   --   ALKPHOS 73 81  --   --   --   --   BILITOT 0.4 0.7  --   --   --   --   PROT 6.3 5.9*  --   --   --   --   ALBUMIN 4.2 3.6   < > 3.0* 2.8* 2.8*   < > = values in this interval not displayed.   Recent Labs    03/03/23 2124 03/05/23 0748 03/06/23 0337 03/07/23 0352 03/08/23 0352  WBC 10.9*   < > 10.9* 13.3* 9.5  NEUTROABS 9.0*  --   --   --   --  HGB 13.4   < > 12.9 11.7* 11.4*  HCT 41.0   < > 39.1 36.3 34.7*  MCV 102.2*   < > 101.6* 100.6* 99.7  PLT 150   < > 147* 173 167   < > = values in this interval not displayed.   Lab Results  Component Value Date   TSH 1.775 03/05/2023   Lab Results  Component Value Date   HGBA1C 5.5 03/06/2023   Lab Results  Component Value Date   CHOL 142 11/25/2021   HDL 69.40 11/25/2021   LDLCALC 58 11/25/2021   LDLDIRECT 47.0 09/04/2015   TRIG 72.0 11/25/2021    CHOLHDL 2 11/25/2021    Significant Diagnostic Results in last 30 days:  No results found.  Assessment/Plan 1. Slurred speech (Primary) - began around 11 am - delayed responses, word finding - h/o MCA stroke 2015 - send to ED for evaluation  2. Confusion - see above - unable to report place or year    Family/ staff Communication: plan discussed with patient and nurse  Labs/tests ordered:  send to ED for evaluation

## 2023-04-06 NOTE — Assessment & Plan Note (Signed)
Since hip surg last month. Checking [place] to r/o DVT

## 2023-04-06 NOTE — Discharge Instructions (Signed)
 Information on my medicine - ELIQUIS (apixaban)  This medication education was reviewed with me or my healthcare representative as part of my discharge preparation.  The pharmacist that spoke with me during my hospital stay was:  Carron Brazen, RPH  Why was Eliquis prescribed for you? Eliquis was prescribed for you to reduce the risk of forming blood clots that can cause a stroke if you have a medical condition called atrial fibrillation (a type of irregular heartbeat) OR to reduce the risk of a blood clots forming after orthopedic surgery.  What do You need to know about Eliquis ? Take your Eliquis TWICE DAILY - one tablet in the morning and one tablet in the evening with or without food.  It would be best to take the doses about the same time each day.  If you have difficulty swallowing the tablet whole please discuss with your pharmacist how to take the medication safely.  Take Eliquis exactly as prescribed by your doctor and DO NOT stop taking Eliquis without talking to the doctor who prescribed the medication.  Stopping may increase your risk of developing a new clot or stroke.  Refill your prescription before you run out.  After discharge, you should have regular check-up appointments with your healthcare provider that is prescribing your Eliquis.  In the future your dose may need to be changed if your kidney function or weight changes by a significant amount or as you get older.  What do you do if you miss a dose? If you miss a dose, take it as soon as you remember on the same day and resume taking twice daily.  Do not take more than one dose of ELIQUIS at the same time.  Important Safety Information A possible side effect of Eliquis is bleeding. You should call your healthcare provider right away if you experience any of the following: Bleeding from an injury or your nose that does not stop. Unusual colored urine (red or dark brown) or unusual colored stools (red or  black). Unusual bruising for unknown reasons. A serious fall or if you hit your head (even if there is no bleeding).  Some medicines may interact with Eliquis and might increase your risk of bleeding or clotting while on Eliquis. To help avoid this, consult your healthcare provider or pharmacist prior to using any new prescription or non-prescription medications, including herbals, vitamins, non-steroidal anti-inflammatory drugs (NSAIDs) and supplements.  This website has more information on Eliquis (apixaban): http://[email or web address].com/eliquis/home

## 2023-04-06 NOTE — ED Provider Notes (Signed)
[place] EMERGENCY DEPARTMENT AT [facility] Provider Note   CSN: [id number] Arrival date & time: 04/06/23  1311     History  Chief Complaint  Patient presents with   Aphasia    Resolved    Theresa [name] is a 89 y.o. female with PMHx afib on Xarelto, CVA, TIA, hypothyroidism, OA, osteoporosis, hip fx 03/03/2023 s/p fixation and currently resident of rehab facility [facility]) who presents to the ED concerned for 2 episodes of slurred speech, difficulty finding words and confusion earlier today. Episodes happened around 9:30A and 12P and lasted around 15-15min. Patient's only complaint currently is right ankle pain that has been present since hip surgery and has been resolving appropriately.   Denies fever, chest pain, dyspnea, cough, nausea, vomiting, diarrhea, dysuria, hematuria, hematochezia.  HPI     Home Medications Prior to Admission medications   Medication Sig Start Date End Date Taking? Authorizing Provider  Calcium Citrate-Vitamin D (CITRACAL PETITES/VITAMIN D PO) Take 1 tablet by mouth 2 (two) times daily.   Yes [provider]  docusate sodium (COLACE) 100 MG capsule Take 1 capsule (100 mg total) by mouth 2 (two) times daily. 03/09/23  Yes Rai, Ripudeep K, MD  escitalopram (LEXAPRO) 10 MG tablet Take 1 tablet (10 mg total) by mouth daily. 09/22/22  Yes Shelva Majestic, MD  fluticasone [facility]) 50 MCG/ACT nasal spray Place 2 sprays into both nostrils daily. 02/11/22  Yes Lula Olszewski, MD  glucosamine-chondroitin 500-400 MG tablet Take 1 tablet by mouth 2 (two) times daily.   Yes [provider]  levothyroxine (SYNTHROID) 75 MCG tablet TAKE 1 TABLET BY MOUTH EVERY DAY BEFORE BREAKFAST 12/12/22  Yes Shelva Majestic, MD  loratadine (CLARITIN) 10 MG tablet Take 1 tablet (10 mg total) by mouth daily. 02/11/22  Yes Lula Olszewski, MD  methocarbamol (ROBAXIN) 500 MG tablet Take 1 tablet (500 mg total) by mouth every 8 (eight)  hours as needed for muscle spasms. 03/09/23  Yes Rai, Ripudeep K, MD  metoprolol tartrate (LOPRESSOR) 25 MG tablet Take 1 tablet (25 mg total) by mouth 2 (two) times daily. 03/09/23  Yes Rai, Ripudeep K, MD  Multiple Vitamins-Minerals (PRESERVISION AREDS 2 PO) Take 1 capsule by mouth 2 (two) times daily. Reported on 09/04/2015   Yes [provider]  polyethylene glycol (MIRALAX / GLYCOLAX) 17 g packet Take 17 g by mouth daily as needed for mild constipation. 03/09/23  Yes Rai, Ripudeep K, MD  Rivaroxaban (XARELTO) 15 MG TABS tablet Take 1 tablet (15 mg total) by mouth daily with supper. 03/09/23  Yes Rai, Ripudeep K, MD  rosuvastatin (CRESTOR) 10 MG tablet TAKE 1 TABLET BY MOUTH DAILY. GENERIC EQUIVALENT FOR CRESTOR 12/08/22  Yes Shelva Majestic, MD  Saline (SIMPLY SALINE) 0.9 % AERS Place 2 each into the nose daily as needed. 02/11/22  Yes Lula Olszewski, MD  TYLENOL 8 HOUR ARTHRITIS PAIN 650 MG CR tablet Take 650 mg by mouth in the morning.   Yes [provider]  vitamin A 78295 UNIT capsule Take 1 capsule by mouth daily.   Yes [provider]  zinc oxide 20 % ointment Apply 1 Application topically as needed for irritation.   Yes [provider]      Allergies    Tape and Rofecoxib    Review of Systems   Review of Systems  Neurological:  Positive for speech difficulty.    Physical Exam Updated Vital Signs BP (!) 165/107  Pulse (!) 107   Temp 98.2 F (36.8 C) (Oral)   Resp 20   Ht 5' (1.524 m)   Wt 40.8 kg   LMP  (LMP Unknown)   SpO2 95%   BMI 17.58 kg/m  Physical Exam Vitals and nursing note reviewed.  Constitutional:      General: She is not in acute distress.    Appearance: She is not ill-appearing or toxic-appearing.  HENT:     Head: Normocephalic and atraumatic.     Mouth/Throat:     Mouth: Mucous membranes are moist.  Eyes:     General: No scleral icterus.       Right eye: No discharge.        Left eye: No discharge.      Conjunctiva/sclera: Conjunctivae normal.  Cardiovascular:     Rate and Rhythm: Normal rate and regular rhythm.     Pulses: Normal pulses.     Heart sounds: Normal heart sounds. No murmur heard. Pulmonary:     Effort: Pulmonary effort is normal. No respiratory distress.     Breath sounds: Normal breath sounds. No wheezing, rhonchi or rales.  Abdominal:     General: Abdomen is flat. Bowel sounds are normal. There is no distension.     Palpations: Abdomen is soft. There is no mass.     Tenderness: There is no abdominal tenderness.  Musculoskeletal:     Right lower leg: No edema.     Left lower leg: No edema.     Comments: Tenderness to palpation of right anterolateral ankle/lower shin. +2 pedal pulses BL. No obvious swelling or skin changes of BL LE. No calf tenderness to palpation.  Skin:    General: Skin is warm and dry.     Findings: No rash.  Neurological:     General: No focal deficit present.     Mental Status: She is alert and oriented to person, place, and time. Mental status is at baseline.     Comments: GCS 15. Speech is goal oriented. No deficits appreciated to CN III-XII; symmetric eyebrow raise, no facial drooping, tongue midline. Patient has equal grip strength bilaterally with 5/5 strength against resistance in all major muscle groups bilaterally. Sensation to light touch intact. Patient moves extremities without ataxia. No nystagmus.  Psychiatric:        Mood and Affect: Mood normal.        Behavior: Behavior normal.     ED Results / Procedures / Treatments   Labs (all labs ordered are listed, but only abnormal results are displayed) Labs Reviewed  CBC - Abnormal; Notable for the following components:      Result Value   RBC 3.73 (*)    Hemoglobin 11.9 (*)    MCV 101.6 (*)    All other components within normal limits  DIFFERENTIAL - Abnormal; Notable for the following components:   Neutro Abs 8.0 (*)    All other components within normal limits  COMPREHENSIVE  METABOLIC PANEL - Abnormal; Notable for the following components:   BUN 25 (*)    Total Protein 6.2 (*)    Alkaline Phosphatase 159 (*)    GFR, Estimated 58 (*)    All other components within normal limits  URINALYSIS, ROUTINE W REFLEX MICROSCOPIC - Abnormal; Notable for the following components:   Color, Urine STRAW (*)    All other components within normal limits  PROTIME-INR  APTT  CBG MONITORING, ED    EKG EKG Interpretation Date/Time:  Monday April 06 2023 13:21:29 EST Ventricular Rate:  95 PR Interval:    QRS Duration:  94 QT Interval:  356 QTC Calculation: 448 R Axis:   -88  Text Interpretation: Atrial fibrillation Left anterior fascicular block No significant change since last tracing Confirmed by Elayne Snare ([id number]) on 04/06/2023 1:22:36 PM  Radiology CT ANGIO HEAD NECK W WO CM Result Date: 04/06/2023 CLINICAL DATA:  Follow-up examination for stroke. EXAM: CT ANGIOGRAPHY HEAD AND NECK WITH AND WITHOUT CONTRAST TECHNIQUE: Multidetector CT imaging of the head and neck was performed using the standard protocol during bolus administration of intravenous contrast. Multiplanar CT image reconstructions and MIPs were obtained to evaluate the vascular anatomy. Carotid stenosis measurements (when applicable) are obtained utilizing NASCET criteria, using the distal internal carotid diameter as the denominator. RADIATION DOSE REDUCTION: This exam was performed according to the departmental dose-optimization program which includes automated exposure control, adjustment of the mA and/or kV according to patient size and/or use of iterative reconstruction technique. CONTRAST:  75mL OMNIPAQUE IOHEXOL 350 MG/ML SOLN COMPARISON:  Prior brain MRI from earlier the same day. FINDINGS: CT HEAD FINDINGS Brain: Moderately advanced cerebral atrophy with mild chronic small vessel ischemic disease. Chronic left frontal lobe infarct. Few additional scattered small remote bilateral cerebellar infarcts.  Previously identified subcentimeter ischemic infarcts not visible by CT. No other acute large vessel territory infarct. No acute intracranial hemorrhage. No mass lesion or midline shift. No hydrocephalus or extra-axial fluid collection. Vascular: No abnormal hyperdense vessel. Scattered vascular calcifications noted within the carotid siphons. Skull: Scalp soft tissues within normal limits.  Calvarium intact. Sinuses/Orbits: Globes and orbital soft tissues within normal limits. Paranasal sinuses and mastoid air cells are clear. Other: None. Review of the MIP images confirms the above findings CTA NECK FINDINGS Aortic arch: Visualized aortic arch within normal limits for caliber with standard branch pattern. Mild aortic atherosclerosis. No significant stenosis about the origin of the great vessels. Right carotid system: Right common and internal carotid arteries are patent without stenosis or dissection. Left carotid system: Left common and internal carotid arteries are patent without stenosis or dissection. Vertebral arteries: Both vertebral arteries arise from subclavian arteries. No proximal subclavian artery stenosis. Right vertebral artery dominant with a diffusely hypoplastic left vertebral artery. Vertebral arteries are patent without stenosis or dissection. Skeleton: Reversal of the normal cervical lordosis with advanced cervical spondylosis. Few probable benign hemangiomata noted. Other neck: No other acute finding. Upper chest: Small layering right pleural effusion. No other acute finding. Review of the MIP images confirms the above findings CTA HEAD FINDINGS Anterior circulation: Mild atheromatous change about the carotid siphons without hemodynamically significant stenosis. A1 segments patent bilaterally. Right A1 diminutive. Normal anterior communicating artery complex. Anterior cerebral arteries patent without significant stenosis. No M1 stenosis or occlusion. Distal MCA branches perfused and symmetric.  Posterior circulation: Dominant right V4 segment widely patent. Hypoplastic left vertebral artery largely terminates in PICA. Both PICA patent. Basilar patent without stenosis. Superior cerebellar and posterior cerebral arteries widely patent bilaterally. Venous sinuses: Grossly patent allowing for timing the contrast bolus. Anatomic variants: As above.  No aneurysm. Review of the MIP images confirms the above findings IMPRESSION: CT HEAD: 1. No acute intracranial abnormality. Previously identified subcentimeter ischemic infarcts not visible by CT. 2. Underlying moderately advanced cerebral atrophy with mild chronic small vessel ischemic disease, with chronic left frontal lobe infarct. Few additional scattered small remote bilateral cerebellar infarcts. CTA HEAD AND NECK: 1. Negative CTA for large vessel occlusion or other emergent finding. 2.  Mild for age atheromatous disease without hemodynamically significant stenosis. 3.  Aortic Atherosclerosis (ICD10-I70.0). 4. Small layering right pleural effusion. Electronically Signed   By: Rise Mu M.D.   On: 04/06/2023 20:05   MR BRAIN WO CONTRAST Result Date: 04/06/2023 CLINICAL DATA:  Transient ischemic attack. EXAM: MRI HEAD WITHOUT CONTRAST TECHNIQUE: Multiplanar, multiecho pulse sequences of the brain and surrounding structures were obtained without intravenous contrast. COMPARISON:  MRI of the brain September 21, 2019. FINDINGS: The study was terminated prematurely due to patient inability to lie still in the scanner. Some of the images obtained are degraded by motion. Brain: Multiple small foci of restricted diffusion are seen in the superior left cerebellar hemisphere, left occipital lobe, bilateral frontal lobes and right parietal lobe, consistent with acute infarcts, likely embolic. No hemorrhage, hydrocephalus, extra-axial collection or mass lesion. Area of encephalomalacia and gliosis in the left frontoparietal region. Multiple small chronic infarcts  in the bilateral cerebellar hemispheres. Moderate parenchymal volume loss. Vascular: Normal flow voids. Skull and upper cervical spine: Grossly unremarkable. Sinuses/Orbits: Negative. Other: None. IMPRESSION: 1. Multiple small foci of restricted diffusion in the superior left cerebellar hemisphere, left occipital lobe, bilateral frontal lobes and right parietal lobe, consistent with acute infarcts, likely embolic. 2. Area of encephalomalacia and gliosis in the left frontoparietal region. 3. Multiple small chronic infarcts in the bilateral cerebellar hemispheres. 4. Moderate parenchymal volume loss. Electronically Signed   By: Baldemar Lenis M.D.   On: 04/06/2023 15:48    Procedures .Critical Care  Performed by: Dorthy Cooler, PA-C Authorized by: Dorthy Cooler, PA-C   Critical care provider statement:    Critical care time (minutes):  30   Critical care was necessary to treat or prevent imminent or life-threatening deterioration of the following conditions:  CNS failure or compromise   Critical care was time spent personally by me on the following activities:  Development of treatment plan with patient or surrogate, discussions with consultants, evaluation of patient's response to treatment, examination of patient, ordering and review of laboratory studies, ordering and review of radiographic studies, ordering and performing treatments and interventions, pulse oximetry, re-evaluation of patient's condition and review of old charts   Care discussed with: admitting provider       Medications Ordered in ED Medications  iohexol (OMNIPAQUE) 350 MG/ML injection 75 mL (75 mLs Intravenous Contrast Given 04/06/23 1757)    ED Course/ Medical Decision Making/ A&P                                 Medical Decision Making Amount and/or Complexity of Data Reviewed Labs: ordered. Radiology: ordered.   This patient presents to the ED for concern of AMS, this involves an  extensive number of treatment options, and is a complaint that carries with it a high risk of complications and morbidity.  The differential diagnosis includes CVA, ICH, intracranial mass, critical dehydration, heptatic dysfunction, uremia, hypercarbia, intoxication/withdrawal, endocrine abnormality, sepsis/infection.   Co morbidities that complicate the patient evaluation  afib on Xarelto, CVA, TIA, hypothyroidism, OA, osteoporosis, hip fx 03/03/2023 s/p fixation and currently resident of rehab facility   Additional history obtained:  Additional history obtained from 04/05/2022 PCP note: "H/o right femoral neck fracture 03/03/2023, MCA stroke 2015, TIA, atrial fibrillation. This morning around 11 am she was noted to have slurred speech and increased confusion. She had just completed PT when symptoms began. Nursing reports elevated HR of 135  and BP of 142/88 earlier this morning. She was given metoprolol as prescribed. HR improved to 88 and BP 147/93. She is unable to tell me where she is or year. Her answers are delayed. Appears to be word finding. She is able to move all extremities. No recent falls or injuries."    Consultations Obtained:  I requested consultation with the Neurologist on-call Dr. Clyda Hurdle,  and discussed lab and imaging findings as well as pertinent plan - they recommend: changing Xarelto to Eliquis   Problem List / ED Course / Critical interventions / Medication management  Admitting patient for acute embolic strokes Patient presented for 2 episodes of confusion, word finding difficulties and slurred speech earlier today which has since resolved. Patient denying any infectious symptoms today. Patient did have episode in ED where she had difficulties finding words but was able to name specific items requested by nurse - NIH was 0 during this episode per nurse. Physical/neuro exam unremarkable. I Ordered, and personally interpreted labs.  CBC with mild anemia with hemoglobin  11.9.  No leukocytosis.  aPTT/PT/INR within normal limits.  CBG 73.  CMP with mild elevation in BUN at 25. ALP also elevated at 159 which I believe is d/t patient's recent hip fracture since she is not having any abdominal pain or infectious symptoms.   I personally viewed and interpreted the EKG/cardiac monitored which showed an underlying rhythm of: afib I ordered imaging studies including MRI Brain to assess for process contributing to patient's symptoms. I independently visualized and interpreted imaging which showed acute embolic infarcts of superior left cerebellar hemisphere, left occipital lobe, bilateral frontal lobes, and right parietal lobe. I agree with the radiologist interpretation Consulted with Dr. Iver Nestle who recommended CTA head and neck and switching anticoagulants to Eliquis. CTA without acute concern. I believe it is in patient's best interest to be observed and obtain an ECHO to further assess for the source of embolisms. Dr. Julian Reil admitting provider. I appreciate their help. Staffed with Dr. Theresia Lo I have reviewed the patients home medicines and have made adjustments as needed   Social Determinants of Health:  Resident at [facility]           Final Clinical Impression(s) / ED Diagnoses Final diagnoses:  Cerebrovascular accident (CVA) due to embolism of other cerebral artery [facility])    Rx / DC Orders ED Discharge Orders     None         Dorthy Cooler, [place] 04/06/23 2121    Rexford Maus, DO 04/07/23 [phone]

## 2023-04-06 NOTE — ED Triage Notes (Signed)
[facility]. Pt has 2 episodes of expressive aphasia at 930AM and noon. Both times lasting 15-20 minutes. No other symptoms noted. Stroke screen negative per EMS. Pt had (R) hip surgery 3 weeks ago - increased swelling 3-4 days.  Pt has hx of Afib - takes Xarelto.  EMS vitals 156/78 HR 80-120 R 19 O2 99% CBG - 129.

## 2023-04-06 NOTE — Progress Notes (Signed)
This is a patient with known pAF on Xarelto, left MCA stroke 2015, HTN, HLD, recent right hip arthroplasty (03/05/2023)  Presenting with 3 episodes of transient confusion / speech difficulty today.   MRI brain personally reviewed  1. Multiple small foci of restricted diffusion in the superior left cerebellar hemisphere, left occipital lobe, bilateral frontal lobes and right parietal lobe, consistent with acute infarcts, likely embolic. 2. Area of encephalomalacia and gliosis in the left frontoparietal region. 3. Multiple small chronic infarcts in the bilateral cerebellar hemispheres. 4. Moderate parenchymal volume loss.   Basic Metabolic Panel: Recent Labs  Lab 04/06/23 1326  NA 137  K 5.0  CL 101  CO2 26  GLUCOSE 98  BUN 25*  CREATININE 0.94  CALCIUM 9.3    CBC: Recent Labs  Lab 04/06/23 1326  WBC 10.5  NEUTROABS 8.0*  HGB 11.9*  HCT 37.9  MCV 101.6*  PLT 191    Coagulation Studies: Recent Labs    04/06/23 1326  LABPROT 14.2  INR 1.1    Adherent to Xarelto per EDP report   From a stroke risk perspective, no medication confers 100% protection against embolic events though risk is substantially reduced with anticoagulation. Xarelto needs to be taken with food for optimal efficacy; could have suboptimal absorption w/ inconsistent food intake. Also pharmacodynamically BID dosing of Eliquis may confer additional protection. Therefore recommend change to Eliquis -- given size of strokes risk of anticoagulation outweighed by benefit.   Would also complete vessel imaging to confirm no critical stenosis (CTA head and neck). If CTA is reassuring and otherwise at baseline with no need for repeat PT/OT evaluations due to decline in functional status, could be safely discharged from neuro perpsective with close follow-up for goal A1c < 7%, LDL < 70  Inpatient ECHO from a purely neurological perspective would not change management (anticogulation) but defer to ED / primary if  this is felt to be needed from a medical perspective    These are curbside recommendations based upon the information readily available in the chart on brief review as well as history and examination information provided to me by requesting provider and do not replace a full detailed consult; EDP to inform patient of phone consult with neurology  Brooke Dare MD-PhD [facility] [phone] 12 min of care, majority in discussion with EDP

## 2023-04-07 ENCOUNTER — Other Ambulatory Visit (HOSPITAL_COMMUNITY): Payer: Self-pay

## 2023-04-07 ENCOUNTER — Observation Stay (HOSPITAL_BASED_OUTPATIENT_CLINIC_OR_DEPARTMENT_OTHER): Payer: Medicare Other

## 2023-04-07 ENCOUNTER — Observation Stay (HOSPITAL_COMMUNITY): Payer: Medicare Other

## 2023-04-07 DIAGNOSIS — M7989 Other specified soft tissue disorders: Secondary | ICD-10-CM | POA: Diagnosis not present

## 2023-04-07 DIAGNOSIS — I6389 Other cerebral infarction: Secondary | ICD-10-CM | POA: Diagnosis not present

## 2023-04-07 DIAGNOSIS — I639 Cerebral infarction, unspecified: Secondary | ICD-10-CM

## 2023-04-07 DIAGNOSIS — I480 Paroxysmal atrial fibrillation: Secondary | ICD-10-CM | POA: Diagnosis not present

## 2023-04-07 DIAGNOSIS — Z7901 Long term (current) use of anticoagulants: Secondary | ICD-10-CM | POA: Diagnosis not present

## 2023-04-07 LAB — ECHOCARDIOGRAM COMPLETE
AR max vel: 1.8 cm2
AV Area VTI: 1.82 cm2
AV Area mean vel: 1.7 cm2
AV Mean grad: 2.7 mm[Hg]
AV Peak grad: 4.8 mm[Hg]
Ao pk vel: 1.09 m/s
Area-P 1/2: 3.49 cm2
Calc EF: 52 %
Height: 60 in
MV M vel: 5.4 m/s
MV Peak grad: 116.6 mm[Hg]
MV VTI: 1.15 cm2
S' Lateral: 3.1 cm
Single Plane A2C EF: 53.3 %
Single Plane A4C EF: 52.2 %
Weight: 1440 [oz_av]

## 2023-04-07 LAB — LIPID PANEL
Cholesterol: 132 mg/dL (ref 0–200)
HDL: 66 mg/dL (ref >40)
LDL Cholesterol: 57 mg/dL (ref 0–99)
Total CHOL/HDL Ratio: 2 {ratio}
Triglycerides: 43 mg/dL (ref <150)
VLDL: 9 mg/dL (ref 0–40)

## 2023-04-07 LAB — MRSA NEXT GEN BY PCR, NASAL: MRSA by PCR Next Gen: NOT DETECTED

## 2023-04-07 NOTE — Evaluation (Signed)
Speech Language Pathology Evaluation Patient Details Name: Theresa [name] MRN: [id number] DOB: [birth year] Today's Date: 04/07/2023 Time: 4782-9562 SLP Time Calculation (min) (ACUTE ONLY): 24 min  Problem List:  Patient Active Problem List   Diagnosis Date Noted   Acute embolic stroke (HCC) 04/06/2023   Pain and swelling of right lower leg 04/06/2023   Atrial fibrillation, chronic (HCC) 03/09/2023   Fracture of femoral neck, right, closed (HCC) 03/03/2023   Paroxysmal atrial fibrillation with RVR (HCC) 03/03/2023   Depression 03/03/2023   Pain due to onychomycosis of toenails of both feet 12/23/2019   Balance disorder 05/03/2018   Macular degeneration 03/31/2017   Difficulty swallowing pills 11/26/2016   Hyperlipidemia 09/01/2014   CKD (chronic kidney disease), stage III (HCC) 03/02/2014   Rosacea 11/23/2013   Major depression in full remission (HCC) 11/23/2013   TIA (transient ischemic attack) 03/21/2013   Hypertension 03/04/2012   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 05/30/2011   Hx of ischemic left MCA stroke 04/08/2011   History of colonic polyps 10/22/2009   CHANGE IN BOWELS 09/20/2009   GASTROESOPHAGEAL REFLUX DISEASE, MILD 06/04/2009   Raynaud's syndrome 03/06/2009   Idiopathic scoliosis and kyphoscoliosis 01/03/2009   Paroxysmal atrial fibrillation (HCC) 07/07/2007   Hypothyroidism 11/23/2006   Osteopenia 11/23/2006   History of mitral valve repair 09/11/2006   Osteoarthritis 09/11/2006   Past Medical History:  Past Medical History:  Diagnosis Date   Atrial fibrillation (HCC)    AFIB   Collagenous colitis    CVA (cerebral vascular accident) (HCC)    a.  L parietal CVA by MRI 12/12, likely embolic;  b.  TEE by report with neg bubble study;  c.  carotid dopplers  12/12:  + plaque, no sig ICA stenosis    History of postmenopausal HRT    Hx of adenomatous colonic polyps    Hypothyroidism    MVP (mitral valve prolapse)    a. s/p MV repair at  [facility] in 2008;  b. Echocardiogram 7/12: EF 50%, status post mitral valve repair with mild MR and minimal MS, mean gradient 4, moderate LAE, LA diam 55 mm; mild RAE, PASP 28-32;    Osteoarthritis    Osteoporosis    Past Surgical History:  Past Surgical History:  Procedure Laterality Date   ANTERIOR APPROACH HEMI HIP ARTHROPLASTY Right 03/05/2023   Procedure: ANTERIOR APPROACH HEMI HIP ARTHROPLASTY;  Surgeon: Samson Frederic, MD;  Location: [facility] ORS;  Service: Orthopedics;  Laterality: Right;   CARDIOVERSION  01/09/2012   Procedure: CARDIOVERSION;  Surgeon: Dolores Patty, MD;  Location: [facility] ENDOSCOPY;  Service: Cardiovascular;  Laterality: N/A;   CATARACT EXTRACTION     CHOLECYSTECTOMY     DILATION AND CURETTAGE OF UTERUS     LUMBAR FUSION     MITRAL VALVE REPAIR  03/17/2006   "mitral valve repair CE ring and maze procedure"   TONSILLECTOMY     HPI:  Theresa [name] is a 89 y.o. female who presented to ED after 2 episodes of slurred speech, difficulty with word finding and confusion. PMHx: a.fib on xarelto, prior stroke, hypothyroidism, 03/05/23 R hip sx after a fall   Assessment / Plan / Recommendation Clinical Impression  Pt presents with expressive language deficits with receptive language relatively appropriate. She scored well on Bedside WAB on all receptive language subtests. She was 100% accurate with repetition and confrontational naming. With spontaneous speech, she scored 8/10 on both the content and fluency sections. Throughout general conversation pt had moments of prolonged  word-finding, more frequently exhibiting semantic paraphasias. She needed Min cues for awareness and was able to self-correct on most occasions. SLP recommends additional f/u to address higher level language and differential diagnosis of cognitive abilities.    SLP Assessment  SLP Recommendation/Assessment: Patient needs continued Speech Lanaguage Pathology Services SLP Visit Diagnosis: Aphasia (R47.01)     Recommendations for follow up therapy are one component of a multi-disciplinary discharge planning process, led by the attending physician.  Recommendations may be updated based on patient status, additional functional criteria and insurance authorization.    Follow Up Recommendations  Skilled nursing-short term rehab (<3 hours/day)    Assistance Recommended at Discharge  Intermittent Supervision/Assistance  Functional Status Assessment Patient has had a recent decline in their functional status and demonstrates the ability to make significant improvements in function in a reasonable and predictable amount of time.  Frequency and Duration min 2x/week  2 weeks      SLP Evaluation Cognition  Overall Cognitive Status: No family/caregiver present to determine baseline cognitive functioning Arousal/Alertness: Awake/alert Orientation Level: Oriented X4 Attention: Sustained Sustained Attention: Appears intact       Comprehension  Auditory Comprehension Overall Auditory Comprehension: Appears within functional limits for tasks assessed Reading Comprehension Reading Status: Impaired Sentence Level: Impaired Interfering Components: Visual acuity;Attention    Expression Expression Primary Mode of Expression: Verbal Verbal Expression Overall Verbal Expression: Impaired Initiation: No impairment Automatic Speech: Name;Social Response Level of Generative/Spontaneous Verbalization: Conversation Repetition: No impairment Naming: Impairment Confrontation: Within functional limits Verbal Errors: Semantic paraphasias;Aware of errors   Oral / Motor  Motor Speech Overall Motor Speech: Appears within functional limits for tasks assessed            Mahala Menghini., M.A. CCC-SLP Acute Rehabilitation Services Office [phone]  Secure chat preferred  04/07/2023, 12:20 PM

## 2023-04-07 NOTE — Progress Notes (Signed)
PROGRESS NOTE    Theresa [name]  ZOX:[id number] DOB: [birth year] DOA: 04/06/2023 PCP: Shelva Majestic, MD   Brief Narrative:  Theresa [name] is a 89 y.o. female with medical history significant of hypothyroidism, recent hip surgery 03/05/2023 currently at SNF, a.fib on xarelto, prior stroke, hypothyroidism presented to ED after 2 episodes of slurred speech, difficulty with word finding and confusion at SNF which lasted for 15 to 20 minutes.  Has right ankle pain since hip surgery.  Admitted with acute stroke.  Neurology on board.  Assessment & Plan:   Principal Problem:   Acute embolic stroke [facility]) Active Problems:   Paroxysmal atrial fibrillation (HCC)   Pain and swelling of right lower leg   History of mitral valve repair  Acute embolic stroke: Patient symptoms have not recurred since presentation to the ED.  Was on Xarelto PTA, switched to Eliquis per neurology recommendations.  MRI confirmed embolic stroke.  CTA negative for LVO.  Will order transthoracic echo.  PT OT SLP to assess.  Neuro following.  Hyperlipidemia: On Crestor 10 mg PTA.  LDL 57 so continue current medication.  Acquired hypothyroidism: Continue Synthroid.  Paroxysmal atrial fibrillation: Not on any rate control medications.  Continue Eliquis.  DVT prophylaxis: apixaban (ELIQUIS) tablet 2.5 mg Start: 04/06/23 2200   Code Status: Full Code  Family Communication:  None present at bedside.  Plan of care discussed with patient in length and he/she verbalized understanding and agreed with it.  Status is: Observation The patient will require care spanning > 2 midnights and should be moved to inpatient because: Needs to be assessed by PT OT and further recommendations from neurology.   Estimated body mass index is 17.58 kg/m as calculated from the following:   Height as of this encounter: 5' (1.524 m).   Weight as of this encounter: 40.8 kg.    Nutritional Assessment: Body mass index is 17.58  kg/m.Marland Kitchen Seen by dietician.  I agree with the assessment and plan as outlined below: Nutrition Status:        . Skin Assessment: I have examined the patient's skin and I agree with the wound assessment as performed by the wound care RN as outlined below:    Consultants:  Neurology  Procedures:  None  Antimicrobials:  Anti-infectives (From admission, onward)    None         Subjective: Patient seen and examined.  She is fully alert and oriented.  Does not have any dysarthria anymore.  She is slow in speaking but she says that is normal for her.  She still feels some weakness in the right lower extremity/same extremity where she had the hip surgery.  Objective: Vitals:   04/06/23 2330 04/07/23 0125 04/07/23 0147 04/07/23 0606  BP: (!) 146/85  (!) 143/82 (!) 161/67  Pulse: 88 82 92 72  Resp: 16 15 18 16   Temp:   97.7 F (36.5 C) 97.7 F (36.5 C)  TempSrc:   Oral Oral  SpO2: 92% 100% 95% 97%  Weight:      Height:       No intake or output data in the 24 hours ending 04/07/23 0754 Filed Weights   04/06/23 1318  Weight: 40.8 kg    Examination:  General exam: Appears calm and comfortable  Respiratory system: Clear to auscultation. Respiratory effort normal. Cardiovascular system: S1 & S2 heard, RRR. No JVD, murmurs, rubs, gallops or clicks. No pedal edema. Gastrointestinal system: Abdomen is nondistended, soft and nontender. No organomegaly  or masses felt. Normal bowel sounds heard. Central nervous system: Alert and oriented.  Very minimal weakness in the right lower extremity. Skin: No rashes, lesions or ulcers Psychiatry: Judgement and insight appear normal. Mood & affect appropriate.    Data Reviewed: I have personally reviewed following labs and imaging studies  CBC: Recent Labs  Lab 04/06/23 1326  WBC 10.5  NEUTROABS 8.0*  HGB 11.9*  HCT 37.9  MCV 101.6*  PLT 191   Basic Metabolic Panel: Recent Labs  Lab 04/06/23 1326  NA 137  K 5.0  CL 101   CO2 26  GLUCOSE 98  BUN 25*  CREATININE 0.94  CALCIUM 9.3   GFR: Estimated Creatinine Clearance: 26.1 mL/min (by C-G formula based on SCr of 0.94 mg/dL). Liver Function Tests: Recent Labs  Lab 04/06/23 1326  AST 33  ALT 28  ALKPHOS 159*  BILITOT 0.7  PROT 6.2*  ALBUMIN 3.6   No results for input(s): "LIPASE", "AMYLASE" in the last 168 hours. No results for input(s): "AMMONIA" in the last 168 hours. Coagulation Profile: Recent Labs  Lab 04/06/23 1326  INR 1.1   Cardiac Enzymes: No results for input(s): "CKTOTAL", "CKMB", "CKMBINDEX", "TROPONINI" in the last 168 hours. BNP (last 3 results) No results for input(s): "PROBNP" in the last 8760 hours. HbA1C: No results for input(s): "HGBA1C" in the last 72 hours. CBG: Recent Labs  Lab 04/06/23 1317  GLUCAP 73   Lipid Profile: Recent Labs    04/07/23 0502  CHOL 132  HDL 66  LDLCALC 57  TRIG 43  CHOLHDL 2.0   Thyroid Function Tests: No results for input(s): "TSH", "T4TOTAL", "FREET4", "T3FREE", "THYROIDAB" in the last 72 hours. Anemia Panel: No results for input(s): "VITAMINB12", "FOLATE", "FERRITIN", "TIBC", "IRON", "RETICCTPCT" in the last 72 hours. Sepsis Labs: No results for input(s): "PROCALCITON", "LATICACIDVEN" in the last 168 hours.  No results found for this or any previous visit (from the past 240 hours).   Radiology Studies: CT ANGIO HEAD NECK W WO CM Result Date: 04/06/2023 CLINICAL DATA:  Follow-up examination for stroke. EXAM: CT ANGIOGRAPHY HEAD AND NECK WITH AND WITHOUT CONTRAST TECHNIQUE: Multidetector CT imaging of the head and neck was performed using the standard protocol during bolus administration of intravenous contrast. Multiplanar CT image reconstructions and MIPs were obtained to evaluate the vascular anatomy. Carotid stenosis measurements (when applicable) are obtained utilizing NASCET criteria, using the distal internal carotid diameter as the denominator. RADIATION DOSE REDUCTION: This  exam was performed according to the departmental dose-optimization program which includes automated exposure control, adjustment of the mA and/or kV according to patient size and/or use of iterative reconstruction technique. CONTRAST:  75mL OMNIPAQUE IOHEXOL 350 MG/ML SOLN COMPARISON:  Prior brain MRI from earlier the same day. FINDINGS: CT HEAD FINDINGS Brain: Moderately advanced cerebral atrophy with mild chronic small vessel ischemic disease. Chronic left frontal lobe infarct. Few additional scattered small remote bilateral cerebellar infarcts. Previously identified subcentimeter ischemic infarcts not visible by CT. No other acute large vessel territory infarct. No acute intracranial hemorrhage. No mass lesion or midline shift. No hydrocephalus or extra-axial fluid collection. Vascular: No abnormal hyperdense vessel. Scattered vascular calcifications noted within the carotid siphons. Skull: Scalp soft tissues within normal limits.  Calvarium intact. Sinuses/Orbits: Globes and orbital soft tissues within normal limits. Paranasal sinuses and mastoid air cells are clear. Other: None. Review of the MIP images confirms the above findings CTA NECK FINDINGS Aortic arch: Visualized aortic arch within normal limits for caliber with standard branch pattern.  Mild aortic atherosclerosis. No significant stenosis about the origin of the great vessels. Right carotid system: Right common and internal carotid arteries are patent without stenosis or dissection. Left carotid system: Left common and internal carotid arteries are patent without stenosis or dissection. Vertebral arteries: Both vertebral arteries arise from subclavian arteries. No proximal subclavian artery stenosis. Right vertebral artery dominant with a diffusely hypoplastic left vertebral artery. Vertebral arteries are patent without stenosis or dissection. Skeleton: Reversal of the normal cervical lordosis with advanced cervical spondylosis. Few probable benign  hemangiomata noted. Other neck: No other acute finding. Upper chest: Small layering right pleural effusion. No other acute finding. Review of the MIP images confirms the above findings CTA HEAD FINDINGS Anterior circulation: Mild atheromatous change about the carotid siphons without hemodynamically significant stenosis. A1 segments patent bilaterally. Right A1 diminutive. Normal anterior communicating artery complex. Anterior cerebral arteries patent without significant stenosis. No M1 stenosis or occlusion. Distal MCA branches perfused and symmetric. Posterior circulation: Dominant right V4 segment widely patent. Hypoplastic left vertebral artery largely terminates in PICA. Both PICA patent. Basilar patent without stenosis. Superior cerebellar and posterior cerebral arteries widely patent bilaterally. Venous sinuses: Grossly patent allowing for timing the contrast bolus. Anatomic variants: As above.  No aneurysm. Review of the MIP images confirms the above findings IMPRESSION: CT HEAD: 1. No acute intracranial abnormality. Previously identified subcentimeter ischemic infarcts not visible by CT. 2. Underlying moderately advanced cerebral atrophy with mild chronic small vessel ischemic disease, with chronic left frontal lobe infarct. Few additional scattered small remote bilateral cerebellar infarcts. CTA HEAD AND NECK: 1. Negative CTA for large vessel occlusion or other emergent finding. 2. Mild for age atheromatous disease without hemodynamically significant stenosis. 3.  Aortic Atherosclerosis (ICD10-I70.0). 4. Small layering right pleural effusion. Electronically Signed   By: Rise Mu M.D.   On: 04/06/2023 20:05   MR BRAIN WO CONTRAST Result Date: 04/06/2023 CLINICAL DATA:  Transient ischemic attack. EXAM: MRI HEAD WITHOUT CONTRAST TECHNIQUE: Multiplanar, multiecho pulse sequences of the brain and surrounding structures were obtained without intravenous contrast. COMPARISON:  MRI of the brain September 21, 2019. FINDINGS: The study was terminated prematurely due to patient inability to lie still in the scanner. Some of the images obtained are degraded by motion. Brain: Multiple small foci of restricted diffusion are seen in the superior left cerebellar hemisphere, left occipital lobe, bilateral frontal lobes and right parietal lobe, consistent with acute infarcts, likely embolic. No hemorrhage, hydrocephalus, extra-axial collection or mass lesion. Area of encephalomalacia and gliosis in the left frontoparietal region. Multiple small chronic infarcts in the bilateral cerebellar hemispheres. Moderate parenchymal volume loss. Vascular: Normal flow voids. Skull and upper cervical spine: Grossly unremarkable. Sinuses/Orbits: Negative. Other: None. IMPRESSION: 1. Multiple small foci of restricted diffusion in the superior left cerebellar hemisphere, left occipital lobe, bilateral frontal lobes and right parietal lobe, consistent with acute infarcts, likely embolic. 2. Area of encephalomalacia and gliosis in the left frontoparietal region. 3. Multiple small chronic infarcts in the bilateral cerebellar hemispheres. 4. Moderate parenchymal volume loss. Electronically Signed   By: Baldemar Lenis M.D.   On: 04/06/2023 15:48    Scheduled Meds:   stroke: early stages of recovery book   Does not apply Once   acetaminophen  650 mg Oral q AM   apixaban  2.5 mg Oral BID   escitalopram  10 mg Oral Daily   levothyroxine  75 mcg Oral Q0600   loratadine  10 mg Oral Daily   rosuvastatin  10 mg Oral QHS   Continuous Infusions:   LOS: 0 days   Hughie Closs, MD [facility]  04/07/2023, 7:54 AM   *Please note that this is a verbal dictation therefore any spelling or grammatical errors are due to the "Dragon [employer] One" system interpretation.  Please page via Amion and do not message via secure chat for urgent patient care matters. Secure chat can be used for non urgent patient care  matters.  How to contact the [facility] Attending or Consulting provider 7A - 7P or covering provider during after hours 7P -7A, for this patient?  Check the care team in [facility] and look for a) attending/consulting [facility] provider listed and b) the [facility] team listed. Page or secure chat 7A-7P. Log into [email or web address] and use [place] universal password to access. If you do not have the password, please contact the hospital operator. Locate the [facility] provider you are looking for under [facility] and page to a number that you can be directly reached. If you still have difficulty reaching the provider, please page the [facility] (Director on Call) for the Hospitalists listed on amion for assistance.

## 2023-04-07 NOTE — Care Management Obs Status (Signed)
MEDICARE OBSERVATION STATUS NOTIFICATION   Patient Details  Name: Theresa [name] MRN: [id number] Date of Birth: 20-Jul-1931   Medicare Observation Status Notification Given:  Yes    Baldemar Lenis, LCSW 04/07/2023, 4:29 PM

## 2023-04-07 NOTE — Progress Notes (Addendum)
STROKE TEAM PROGRESS NOTE   BRIEF HPI Ms. Kyndell Paraiso is a 89 y.o. female with PMH significant for atrial fibrillation (on Xarelto at home), hypertension, hyperlipidemia, left parietal stroke in 2012, hypothyroidism, MVR 2008, OA, recent right hip fracture status post Hemi hip arthroplasty December 2024 who presented to the ED 1/20 due to 3 episodes of transient confusion and speech difficulty. MRI brain reveals multifocal punctate acute ischemic strokes.  CTA negative for LVO. Patient endorsed compliance with Xarelto.  INTERIM HISTORY/SUBJECTIVE  No acute events or neurological changes overnight.  On exam today, patient is sitting up in chair.  She has intermittent delays in speech with occasional dysarthria and paraphasic errors.  She also has a slight right facial droop. Her Xarelto was changed to Eliquis yesterday.  Ultrasound negative for DVT this morning.  OBJECTIVE  CBC    Component Value Date/Time   WBC 10.5 04/06/2023 1326   RBC 3.73 (L) 04/06/2023 1326   HGB 11.9 (L) 04/06/2023 1326   HCT 37.9 04/06/2023 1326   PLT 191 04/06/2023 1326   MCV 101.6 (H) 04/06/2023 1326   MCH 31.9 04/06/2023 1326   MCHC 31.4 04/06/2023 1326   RDW 13.2 04/06/2023 1326   LYMPHSABS 1.3 04/06/2023 1326   MONOABS 0.9 04/06/2023 1326   EOSABS 0.1 04/06/2023 1326   BASOSABS 0.1 04/06/2023 1326    BMET    Component Value Date/Time   NA 137 04/06/2023 1326   K 5.0 04/06/2023 1326   CL 101 04/06/2023 1326   CO2 26 04/06/2023 1326   GLUCOSE 98 04/06/2023 1326   GLUCOSE 105 (H) 02/16/2006 1506   BUN 25 (H) 04/06/2023 1326   CREATININE 0.94 04/06/2023 1326   CREATININE 1.09 (H) 11/02/2019 1121   CALCIUM 9.3 04/06/2023 1326   GFRNONAA 58 (L) 04/06/2023 1326    IMAGING past 24 hours VAS [place] LOWER EXTREMITY VENOUS (DVT) Result Date: 04/07/2023  Lower Venous DVT Study Patient Name:  NEESA KEITH  Date of Exam:   04/07/2023 Medical Rec #: [id number]           Accession #:    [id number]  Date of Birth: [birth year]           Patient Gender: F Patient Age:   7 years Exam Location:  [facility], [facility] Procedure:      VAS [place] LOWER EXTREMITY VENOUS (DVT) Referring Phys: Lyda Perone --------------------------------------------------------------------------------  Indications: Swelling.  Risk Factors: None identified. Anticoagulation: Eliquis. Comparison Study: No prior studies. Performing Technologist: Chanda Busing RVT  Examination Guidelines: A complete evaluation includes B-mode imaging, spectral Doppler, color Doppler, and power Doppler as needed of all accessible portions of each vessel. Bilateral testing is considered an integral part of a complete examination. Limited examinations for reoccurring indications may be performed as noted. The reflux portion of the exam is performed with the patient in reverse Trendelenburg.  +---------+---------------+---------+-----------+----------+--------------+ RIGHT    CompressibilityPhasicitySpontaneityPropertiesThrombus Aging +---------+---------------+---------+-----------+----------+--------------+ CFV      Full           Yes      No                                  +---------+---------------+---------+-----------+----------+--------------+ SFJ      Full                                                        +---------+---------------+---------+-----------+----------+--------------+  FV Prox  Full                                                        +---------+---------------+---------+-----------+----------+--------------+ FV Mid   Full                                                        +---------+---------------+---------+-----------+----------+--------------+ FV DistalFull                                                        +---------+---------------+---------+-----------+----------+--------------+ PFV      Full                                                         +---------+---------------+---------+-----------+----------+--------------+ POP      Full           Yes      Yes                                 +---------+---------------+---------+-----------+----------+--------------+ PTV      Full                                                        +---------+---------------+---------+-----------+----------+--------------+ PERO     Full                                                        +---------+---------------+---------+-----------+----------+--------------+   +----+---------------+---------+-----------+----------+--------------+ LEFTCompressibilityPhasicitySpontaneityPropertiesThrombus Aging +----+---------------+---------+-----------+----------+--------------+ CFV Full           Yes      No                                  +----+---------------+---------+-----------+----------+--------------+    Summary: RIGHT: - There is no evidence of deep vein thrombosis in the lower extremity.  - No cystic structure found in the popliteal fossa.  LEFT: - No evidence of common femoral vein obstruction.   *See table(s) above for measurements and observations.    Preliminary    CT ANGIO HEAD NECK W WO CM Result Date: 04/06/2023 CLINICAL DATA:  Follow-up examination for stroke. EXAM: CT ANGIOGRAPHY HEAD AND NECK WITH AND WITHOUT CONTRAST TECHNIQUE: Multidetector CT imaging of the head and neck was performed using the standard protocol during bolus administration of intravenous contrast. Multiplanar CT image reconstructions and MIPs were obtained to evaluate the vascular anatomy. Carotid  stenosis measurements (when applicable) are obtained utilizing NASCET criteria, using the distal internal carotid diameter as the denominator. RADIATION DOSE REDUCTION: This exam was performed according to the departmental dose-optimization program which includes automated exposure control, adjustment of the mA and/or kV according to patient size and/or use of  iterative reconstruction technique. CONTRAST:  75mL OMNIPAQUE IOHEXOL 350 MG/ML SOLN COMPARISON:  Prior brain MRI from earlier the same day. FINDINGS: CT HEAD FINDINGS Brain: Moderately advanced cerebral atrophy with mild chronic small vessel ischemic disease. Chronic left frontal lobe infarct. Few additional scattered small remote bilateral cerebellar infarcts. Previously identified subcentimeter ischemic infarcts not visible by CT. No other acute large vessel territory infarct. No acute intracranial hemorrhage. No mass lesion or midline shift. No hydrocephalus or extra-axial fluid collection. Vascular: No abnormal hyperdense vessel. Scattered vascular calcifications noted within the carotid siphons. Skull: Scalp soft tissues within normal limits.  Calvarium intact. Sinuses/Orbits: Globes and orbital soft tissues within normal limits. Paranasal sinuses and mastoid air cells are clear. Other: None. Review of the MIP images confirms the above findings CTA NECK FINDINGS Aortic arch: Visualized aortic arch within normal limits for caliber with standard branch pattern. Mild aortic atherosclerosis. No significant stenosis about the origin of the great vessels. Right carotid system: Right common and internal carotid arteries are patent without stenosis or dissection. Left carotid system: Left common and internal carotid arteries are patent without stenosis or dissection. Vertebral arteries: Both vertebral arteries arise from subclavian arteries. No proximal subclavian artery stenosis. Right vertebral artery dominant with a diffusely hypoplastic left vertebral artery. Vertebral arteries are patent without stenosis or dissection. Skeleton: Reversal of the normal cervical lordosis with advanced cervical spondylosis. Few probable benign hemangiomata noted. Other neck: No other acute finding. Upper chest: Small layering right pleural effusion. No other acute finding. Review of the MIP images confirms the above findings CTA  HEAD FINDINGS Anterior circulation: Mild atheromatous change about the carotid siphons without hemodynamically significant stenosis. A1 segments patent bilaterally. Right A1 diminutive. Normal anterior communicating artery complex. Anterior cerebral arteries patent without significant stenosis. No M1 stenosis or occlusion. Distal MCA branches perfused and symmetric. Posterior circulation: Dominant right V4 segment widely patent. Hypoplastic left vertebral artery largely terminates in PICA. Both PICA patent. Basilar patent without stenosis. Superior cerebellar and posterior cerebral arteries widely patent bilaterally. Venous sinuses: Grossly patent allowing for timing the contrast bolus. Anatomic variants: As above.  No aneurysm. Review of the MIP images confirms the above findings IMPRESSION: CT HEAD: 1. No acute intracranial abnormality. Previously identified subcentimeter ischemic infarcts not visible by CT. 2. Underlying moderately advanced cerebral atrophy with mild chronic small vessel ischemic disease, with chronic left frontal lobe infarct. Few additional scattered small remote bilateral cerebellar infarcts. CTA HEAD AND NECK: 1. Negative CTA for large vessel occlusion or other emergent finding. 2. Mild for age atheromatous disease without hemodynamically significant stenosis. 3.  Aortic Atherosclerosis (ICD10-I70.0). 4. Small layering right pleural effusion. Electronically Signed   By: Rise Mu M.D.   On: 04/06/2023 20:05   MR BRAIN WO CONTRAST Result Date: 04/06/2023 CLINICAL DATA:  Transient ischemic attack. EXAM: MRI HEAD WITHOUT CONTRAST TECHNIQUE: Multiplanar, multiecho pulse sequences of the brain and surrounding structures were obtained without intravenous contrast. COMPARISON:  MRI of the brain September 21, 2019. FINDINGS: The study was terminated prematurely due to patient inability to lie still in the scanner. Some of the images obtained are degraded by motion. Brain: Multiple small foci  of restricted diffusion are seen in the superior  left cerebellar hemisphere, left occipital lobe, bilateral frontal lobes and right parietal lobe, consistent with acute infarcts, likely embolic. No hemorrhage, hydrocephalus, extra-axial collection or mass lesion. Area of encephalomalacia and gliosis in the left frontoparietal region. Multiple small chronic infarcts in the bilateral cerebellar hemispheres. Moderate parenchymal volume loss. Vascular: Normal flow voids. Skull and upper cervical spine: Grossly unremarkable. Sinuses/Orbits: Negative. Other: None. IMPRESSION: 1. Multiple small foci of restricted diffusion in the superior left cerebellar hemisphere, left occipital lobe, bilateral frontal lobes and right parietal lobe, consistent with acute infarcts, likely embolic. 2. Area of encephalomalacia and gliosis in the left frontoparietal region. 3. Multiple small chronic infarcts in the bilateral cerebellar hemispheres. 4. Moderate parenchymal volume loss. Electronically Signed   By: Baldemar Lenis M.D.   On: 04/06/2023 15:48    Vitals:   04/07/23 0147 04/07/23 0606 04/07/23 0825 04/07/23 1117  BP: (!) 143/82 (!) 161/67 136/89 (!) 128/93  Pulse: 92 72 (!) 103   Resp: 18 16    Temp: 97.7 F (36.5 C) 97.7 F (36.5 C) 98.1 F (36.7 C) 97.7 F (36.5 C)  TempSrc: Oral Oral Oral Oral  SpO2: 95% 97% 98% 100%  Weight:      Height:         PHYSICAL EXAM General:  Alert, well-nourished, well-developed patient in no acute distress Psych:  Mood and affect appropriate for situation CV: Regular rate and rhythm on monitor Respiratory:  Regular, unlabored respirations on room air GI: Abdomen soft and nontender   NEURO:  Mental Status: AA&Ox3, patient is able to give clear and coherent history Speech/Language: Intermittent delayed speech with occasional mild dysarthria and paraphasic errors  Cranial Nerves:  II: PERRL. Visual fields full.  III, IV, VI: EOMI. Eyelids elevate  symmetrically.  V: Sensation is intact to light touch and symmetrical to face.  VII: Slight right facial droop VIII: hearing intact to voice. IX, X: Palate elevates symmetrically. Phonation is normal.  ZO:XWRUEAVW shrug 5/5. XII: tongue is midline without fasciculations. Motor: 5/5 strength to all muscle groups tested.  Tone: is normal and bulk is normal Sensation- Intact to light touch bilaterally. Extinction absent to light touch to DSS.   Coordination: FTN intact bilaterally, HKS: no ataxia in BLE.No drift.  Gait- deferred  Most Recent NIH: 2 on exam     ASSESSMENT/PLAN  Stroke: left cerebellar, L MCA/PCA, R MCA/ACA punctate acute ischemic strokes Etiology: Likely due to atrial fibrillation with failure of Xarelto CTA head & neck  Negative CTA for large vessel occlusion or other emergent finding. Mild for age atheromatous disease without hemodynamically significant stenosis. Aortic atherosclerosis  MRI   Several punctate/small foci of restricted diffusion are seen: one in the superior left cerebellar hemisphere, one in the left occipital lobe, the bilateral frontal lobes and right parietal lobe, consistent with acute infarcts, likely embolic. Area of encephalomalacia and gliosis in the left frontoparietal region is also noted. Multiple small chronic infarcts in the bilateral cerebellar hemispheres. Moderate parenchymal volume loss.  VAS [place] LE DVT: Negative 2D Echo EF 55-60% LDL 57 HgbA1c 5.5 in 02/2023 VTE prophylaxis - Eliquis  Xarelto (rivaroxaban) daily prior to admission, switch to Eliquis 2.5 mg twice daily Therapy recommendations:  SNF Disposition: Pending  Hx of Stroke/TIA 02/2011 admitted for aphasia and right facial droop.  MRI: Left parietal infarct.  Changed from aspirin to Plavix, put on Lipitor.   Started on Coumadin around 05/2011 03/2013 admitted for transient left-sided weakness and slurring speech.  MRI negative.  Continue on Xarelto, and recommend to take with  big meal  Atrial fibrillation Home Meds: Xarelto, metoprolol Patient stated that she took Xarelto every day before meal Now switch to Eliquis 2.5 twice daily  Hypertension Home meds: Metoprolol 25 mg twice daily Stable Gradually normalize BP in 2 to 3 days Avoid hypotension Long-term BP goal normotensive  Hyperlipidemia Home meds: Crestor 10 mg, resumed in hospital LDL 57, goal < 70 No high intensity Crestor needed given LDL at goal Continue statin at discharge  Other stroke risk factors Former cigarette smoker Advanced age History of ocular migraine  Other Active Problems Hypothyroidism, home Synthroid continued MVR in 2008 Right hip fracture status post Hemi enteroplasty in 02/2023   Hospital day # 0   Pt seen by Neuro NP/APP and later by MD. Note/plan to be edited by MD as needed.    Lynnae January, DNP, AGACNP-BC [facility] Please use AMION for contact information & EPIC for messaging.  ATTENDING NOTE: I reviewed above note and agree with the assessment and plan. Pt was seen and examined.   No family at bedside.  Patient sitting in chair, AOx3, slight right facial droop with bradyphonia and occasional paraphasic errors.  Patient stated that her speech much improved from yesterday.  She stated that she took Xarelto every day before meal.  MRI concerning for embolic stroke, concerning for Xarelto failure.  Switched Xarelto to [employer].  Continue statin.  PT and OT recommend SNF.  For detailed assessment and plan, please refer to above/below as I have made changes wherever appropriate.   Neurology will sign off. Please call with questions. Pt will follow up with stroke clinic NP at [facility] in about 4 weeks. Thanks for the consult.   Marvel Plan, MD PhD Stroke Neurology 04/07/2023 9:47 PM     To contact Stroke Continuity provider, please refer to [email or web address]. After hours, contact General Neurology

## 2023-04-07 NOTE — Evaluation (Signed)
Physical Therapy Evaluation Patient Details Name: Theresa [name] MRN: [id number] DOB: [birth year] Today's Date: 04/07/2023  History of Present Illness  Theresa [name] is a 89 y.o. female who presented to ED after 2 episodes of slurred speech, difficulty with word finding and confusion. PMHx: a.fib on xarelto, prior stroke, hypothyroidism, 03/05/23 R hip sx after a fall  Clinical Impression  PTA pt was at [facility] rehabbing from her fall and hip surgery in December. Pt reports she was working with therapy and was able to ambulate with RW and was still requiring assist with ADLs. Pt is currently frustrated with difficulty with word finding and confusion. Pt is limited in safe mobility by continued pain in her R LE and decreased endurance. Pt is contact guard for transfers and contact guard progressing to min A for ambulation with RW due to increasing discomfort in her R LE. PT recommending returning to rehab at [facility] to improve her strength and endurance. PT will continue to follow acutely.         If plan is discharge home, recommend the following: A little help with walking and/or transfers;A little help with bathing/dressing/bathroom;Assistance with cooking/housework;Direct supervision/assist for medications management;Direct supervision/assist for financial management;Assist for transportation;Help with stairs or ramp for entrance;Supervision due to cognitive status   Can travel by private vehicle   Yes    Equipment Recommendations None recommended by PT     Functional Status Assessment Patient has had a recent decline in their functional status and demonstrates the ability to make significant improvements in function in a reasonable and predictable amount of time.     Precautions / Restrictions Precautions Precautions: Fall Precaution Comments: R hip sx 12/19, WBAT Restrictions Weight Bearing Restrictions Per Provider Order: No      Mobility  Bed Mobility                General bed mobility comments: OOB in recliner on entry    Transfers Overall transfer level: Needs assistance Equipment used: Rolling walker (2 wheels) Transfers: Sit to/from Stand Sit to Stand: Contact guard assist           General transfer comment: good power up and self steady in RW, contact guard for safety    Ambulation/Gait Ambulation/Gait assistance: Contact guard assist, Min assist Gait Distance (Feet): 60 Feet Assistive device: Rolling walker (2 wheels) Gait Pattern/deviations: Step-through pattern, Step-to pattern, Decreased weight shift to right, Decreased stance time - right, Decreased step length - left, Antalgic Gait velocity: slowed Gait velocity interpretation: <1.31 ft/sec, indicative of household ambulator   General Gait Details: contact guard progressing to min A with distance as pt placing decreased weight on R LE and going from a step through to step to pattern     Modified Rankin (Stroke Patients Only) Modified Rankin (Stroke Patients Only) Pre-Morbid Rankin Score: Moderate disability Modified Rankin: Moderately severe disability     Balance Overall balance assessment: Needs assistance Sitting-balance support: Feet supported Sitting balance-Leahy Scale: Fair     Standing balance support: No upper extremity supported, During functional activity Standing balance-Leahy Scale: Fair                               Pertinent Vitals/Pain Pain Assessment Pain Assessment: Faces Faces Pain Scale: Hurts a little bit Pain Location: RLE Pain Descriptors / Indicators: Discomfort Pain Intervention(s): Limited activity within patient's tolerance, Monitored during session, Repositioned    Home Living Family/patient  expects to be discharged to:: Skilled nursing facility   Available Help at Discharge: Friend(s);Available PRN/intermittently Type of Home: Other(Comment) (was at ILF until recent hip surgery, and has been at SNF  since that time)             Additional Comments: at [facility] SNF; ws residing at [facility] ILF prior to fall in december    Prior Function Prior Level of Function : Needs assist             Mobility Comments: RW at SNF, states she has been able to walk hallway distances ADLs Comments: toileting and grooming mod I, staff assists with bathing and dressing. meals are delivered to her room     Extremity/Trunk Assessment   Upper Extremity Assessment Upper Extremity Assessment: Defer to OT evaluation    Lower Extremity Assessment Lower Extremity Assessment: RLE deficits/detail RLE Deficits / Details: resisted hip flexion produces grimace, knee ext/flex and ankle dorsi/plantarflexion grossly 3+/5 RLE Sensation: WNL RLE Coordination: decreased fine motor    Cervical / Trunk Assessment Cervical / Trunk Assessment: Kyphotic  Communication   Communication Communication: Difficulty communicating thoughts/reduced clarity of speech  Cognition Arousal: Alert Behavior During Therapy: WFL for tasks assessed/performed Overall Cognitive Status: No family/caregiver present to determine baseline cognitive functioning                                 General Comments: had some difficulty answering PLOF questions, unsure if this is due to impaired cognition or communication        General Comments General comments (skin integrity, edema, etc.): VSS on RA        Assessment/Plan    PT Assessment Patient needs continued PT services  PT Problem List Decreased strength;Decreased activity tolerance;Decreased balance;Decreased mobility;Decreased range of motion;Decreased coordination;Pain       PT Treatment Interventions DME instruction;Gait training;Functional mobility training;Therapeutic activities;Therapeutic exercise;Balance training;Neuromuscular re-education;Cognitive remediation;Patient/family education    PT Goals (Current goals can be found in the Care  Plan section)  Acute Rehab PT Goals PT Goal Formulation: With patient Time For Goal Achievement: 04/21/23 Potential to Achieve Goals: Fair    Frequency Min 1X/week        AM-PAC PT "6 Clicks" Mobility  Outcome Measure Help needed turning from your back to your side while in a flat bed without using bedrails?: None Help needed moving from lying on your back to sitting on the side of a flat bed without using bedrails?: A Little Help needed moving to and from a bed to a chair (including a wheelchair)?: A Little Help needed standing up from a chair using your arms (e.g., wheelchair or bedside chair)?: A Little Help needed to walk in hospital room?: A Little Help needed climbing 3-5 steps with a railing? : A Lot 6 Click Score: 18    End of Session Equipment Utilized During Treatment: Gait belt Activity Tolerance: Patient limited by pain Patient left: in chair;with call bell/phone within reach;with chair alarm set Nurse Communication: Mobility status PT Visit Diagnosis: Unsteadiness on feet (R26.81);Muscle weakness (generalized) (M62.81);Difficulty in walking, not elsewhere classified (R26.2);Pain Pain - Right/Left: Right Pain - part of body: Leg    Time: 8295-6213 PT Time Calculation (min) (ACUTE ONLY): 22 min   Charges:   PT Evaluation $PT Eval Moderate Complexity: 1 Mod   PT General Charges $$ ACUTE PT VISIT: 1 Visit         Lanora Manis  Karma Ganja PT, DPT Acute Rehabilitation Services Please use secure chat or  Call Office [phone]   Elon Alas [facility] 04/07/2023, 3:46 PM

## 2023-04-07 NOTE — TOC Benefit Eligibility Note (Signed)
Pharmacy Patient Advocate Encounter  Insurance verification completed.   The patient is insured through [employer] test claim for Eliquis. Currently a quantity of 60 is a 30 day supply and the co-pay is $45.00 .  This test claim was processed through [facility]- copay amounts may vary at other pharmacies due to pharmacy/plan contracts, or as the patient moves through the different stages of their insurance plan.

## 2023-04-07 NOTE — Progress Notes (Signed)
  Echocardiogram 2D Echocardiogram has been performed.  Reinaldo Raddle Lygia Olaes 04/07/2023, 10:18 AM

## 2023-04-07 NOTE — Progress Notes (Signed)
SLP Cancellation Note  Patient Details Name: Annlouise Couch MRN: [id number] DOB: 03-Oct-1931   Cancelled treatment:       Reason Eval/Treat Not Completed: Patient at procedure or test/unavailable (pt having other testing done in room). Will f/u as able.    Mahala Menghini., M.A. CCC-SLP Acute Rehabilitation Services Office [phone]  Secure chat preferred  04/07/2023, 10:11 AM

## 2023-04-07 NOTE — Plan of Care (Signed)

## 2023-04-07 NOTE — Progress Notes (Signed)
Right lower extremity venous duplex has been completed. Preliminary results can be found in CV Proc through chart review.   04/07/23 12:21 PM Olen Cordial RVT

## 2023-04-07 NOTE — Evaluation (Signed)
Occupational Therapy Evaluation Patient Details Name: Theresa [name] MRN: [id number] DOB: 22-May-1931 Today's Date: 04/07/2023   History of Present Illness Theresa [name] is a 89 y.o. female who presented to ED after 2 episodes of slurred speech, difficulty with word finding and confusion. PMHx: a.fib on xarelto, prior stroke, hypothyroidism, 03/05/23 R hip sx after a fall   Clinical Impression   Theresa [name] was evaluated s/p the above admission list. She is currently in rehab at [facility], walking with a RW and needing some assist for ADLs from staff at baseline. Upon evaluation the pt was limited by RLE pain, limited activity tolerance, impaired cognition vs. Expressive communication?, and generalized weakness. Overall she needed up to CGA for tranfers and mobility with RW and tolerated room distances and static standing during ADLs. Due to the deficits listed below the pt also needs up to CGA for ADLs for safety. Pt will benefit from continued acute OT services and skilled inpatient follow up therapy, <3 hours/day at current facility.        If plan is discharge home, recommend the following: A little help with walking and/or transfers;A little help with bathing/dressing/bathroom;Assistance with cooking/housework;Direct supervision/assist for financial management;Assist for transportation;Help with stairs or ramp for entrance    Functional Status Assessment  Patient has had a recent decline in their functional status and demonstrates the ability to make significant improvements in function in a reasonable and predictable amount of time.  Equipment Recommendations  None recommended by OT       Precautions / Restrictions Precautions Precautions: Fall Precaution Comments: R hip sx 12/19, WBAT Restrictions Weight Bearing Restrictions Per Provider Order: No      Mobility Bed Mobility Overal bed mobility: Needs Assistance Bed Mobility: Supine to Sit     Supine to sit: Supervision           Transfers Overall transfer level: Needs assistance Equipment used: Rolling walker (2 wheels) Transfers: Sit to/from Stand Sit to Stand: Contact guard assist                  Balance Overall balance assessment: Needs assistance Sitting-balance support: Feet supported Sitting balance-Leahy Scale: Fair     Standing balance support: No upper extremity supported, During functional activity Standing balance-Leahy Scale: Fair             ADL either performed or assessed with clinical judgement   ADL Overall ADL's : Needs assistance/impaired Eating/Feeding: Independent   Grooming: Supervision/safety;Standing   Upper Body Bathing: Supervision/ safety;Sitting   Lower Body Bathing: Contact guard assist;Sit to/from stand   Upper Body Dressing : Supervision/safety;Sitting   Lower Body Dressing: Contact guard assist;Sit to/from stand   Toilet Transfer: Contact guard assist;Ambulation;Rolling walker (2 wheels)   Toileting- Clothing Manipulation and Hygiene: Supervision/safety;Sitting/lateral lean       Functional mobility during ADLs: Contact guard assist;Rolling walker (2 wheels)       Vision Baseline Vision/History: 1 Wears glasses Vision Assessment?: No apparent visual deficits     Perception Perception: Not tested       Praxis Praxis: Not tested       Pertinent Vitals/Pain Pain Assessment Pain Assessment: Faces Faces Pain Scale: Hurts a little bit Pain Location: RLE Pain Descriptors / Indicators: Discomfort Pain Intervention(s): Monitored during session, Limited activity within patient's tolerance     Extremity/Trunk Assessment Upper Extremity Assessment Upper Extremity Assessment: Generalized weakness   Lower Extremity Assessment Lower Extremity Assessment: Defer to PT evaluation   Cervical / Trunk Assessment Cervical /  Trunk Assessment: Kyphotic   Communication Communication Communication: Difficulty communicating thoughts/reduced  clarity of speech   Cognition Arousal: Alert Behavior During Therapy: WFL for tasks assessed/performed Overall Cognitive Status: No family/caregiver present to determine baseline cognitive functioning             General Comments: had some difficulty answering PLOF questions, unsure if this is due to impaired cognition or communication     General Comments  VSS on RA            Home Living Family/patient expects to be discharged to:: Skilled nursing facility                 Additional Comments: at [facility]; ws residing at [facility] prior to fall in december      Prior Functioning/Environment Prior Level of Function : Needs assist             Mobility Comments: RW at SNF, states she has been able to walk hallway distances ADLs Comments: toileting and grooming mod I, staff assists with bathing and dressing. meals are delivered to her room        OT Problem List: Decreased strength;Decreased range of motion;Decreased activity tolerance;Impaired balance (sitting and/or standing);Decreased safety awareness      OT Treatment/Interventions: Self-care/ADL training;Therapeutic exercise;DME and/or AE instruction;Therapeutic activities    OT Goals(Current goals can be found in the care plan section) Acute Rehab OT Goals Patient Stated Goal: back to ILF OT Goal Formulation: With patient Time For Goal Achievement: 04/21/23 Potential to Achieve Goals: Good ADL Goals Pt Will Perform Grooming: with modified independence;standing Pt Will Perform Lower Body Dressing: with modified independence;sit to/from stand Pt Will Transfer to Toilet: with modified independence;ambulating Additional ADL Goal #1: Pt will demonstrate improved cognition by indep following 3 step way finding task  OT Frequency: Min 1X/week       AM-PAC OT "6 Clicks" Daily Activity     Outcome Measure Help from another person eating meals?: None Help from another person taking care of  personal grooming?: A Little Help from another person toileting, which includes using toliet, bedpan, or urinal?: A Little Help from another person bathing (including washing, rinsing, drying)?: A Little Help from another person to put on and taking off regular upper body clothing?: A Little Help from another person to put on and taking off regular lower body clothing?: A Little 6 Click Score: 19   End of Session Equipment Utilized During Treatment: Rolling walker (2 wheels) Nurse Communication: Mobility status  Activity Tolerance: Patient tolerated treatment well Patient left: in chair;with call bell/phone within reach  OT Visit Diagnosis: Unsteadiness on feet (R26.81);Other abnormalities of gait and mobility (R26.89);Muscle weakness (generalized) (M62.81);History of falling (Z91.81)                Time: 1012-1030 OT Time Calculation (min): 18 min Charges:  OT General Charges $OT Visit: 1 Visit OT Evaluation $OT Eval Moderate Complexity: 1 Mod  Derenda Mis, OTR/L Acute Rehabilitation Services Office [phone] Secure Chat Communication Preferred   Donia Pounds 04/07/2023, 11:07 AM

## 2023-04-08 ENCOUNTER — Other Ambulatory Visit (HOSPITAL_COMMUNITY): Payer: Self-pay

## 2023-04-08 DIAGNOSIS — I639 Cerebral infarction, unspecified: Secondary | ICD-10-CM | POA: Diagnosis not present

## 2023-04-08 LAB — BASIC METABOLIC PANEL
Anion gap: 12 (ref 5–15)
BUN: 24 mg/dL — ABNORMAL HIGH (ref 8–23)
CO2: 23 mmol/L (ref 22–32)
Calcium: 8.6 mg/dL — ABNORMAL LOW (ref 8.9–10.3)
Chloride: 105 mmol/L (ref 98–111)
Creatinine, Ser: 0.89 mg/dL (ref 0.44–1.00)
GFR, Estimated: >60 mL/min (ref >60)
Glucose, Bld: 89 mg/dL (ref 70–99)
Potassium: 3.6 mmol/L (ref 3.5–5.1)
Sodium: 140 mmol/L (ref 135–145)

## 2023-04-08 LAB — T4, FREE: Free T4: 1.04 ng/dL (ref 0.61–1.12)

## 2023-04-08 LAB — TSH: TSH: 1.862 u[IU]/mL (ref 0.350–4.500)

## 2023-04-08 LAB — MAGNESIUM: Magnesium: 2 mg/dL (ref 1.7–2.4)

## 2023-04-08 MED ORDER — METOPROLOL TARTRATE 25 MG PO TABS
25.0000 mg | ORAL_TABLET | Freq: Two times a day (BID) | ORAL | Status: DC
Start: 2023-04-08 — End: 2023-04-08
  Administered 2023-04-08: 25 mg via ORAL
  Filled 2023-04-08: qty 1

## 2023-04-08 MED ORDER — APIXABAN 2.5 MG PO TABS
2.5000 mg | ORAL_TABLET | Freq: Two times a day (BID) | ORAL | 0 refills | Status: AC
  Filled 2023-04-08: qty 60, 30d supply, fill #0

## 2023-04-08 NOTE — Discharge Summary (Signed)
Physician Discharge Summary   Patient: Theresa [name] MRN: [id number] DOB: [birth year]  Admit date:     04/06/2023  Discharge date: 04/08/23  Discharge Physician: Theresa [name]  PCP: Theresa Majestic, MD  Recommendations at discharge:  Follow up with PCP and neuro as recommended   Follow-up Information     [name]. Schedule an appointment as soon as possible for a visit in 1 month(s).   Specialty: Neurology Why: stroke clinic Contact information: [address] [place] [address] [phone]        Theresa Majestic, MD. Schedule an appointment as soon as possible for a visit in 1 week(s).   Specialty: Family Medicine Contact information: [address] [place] [address] [phone]         [facility], [facility]. Go to.   Specialty: Specialist Why: the Appointment As Scheduled Contact information: [address] [place] [address] [phone]                Discharge Diagnoses: Principal Problem:   Acute embolic stroke [facility]) Active Problems:   Paroxysmal atrial fibrillation (HCC)   Pain and swelling of right lower leg   History of mitral valve repair  Hospital Course: Theresa [name] is a 89 y.o. female with medical history significant of hypothyroidism, recent hip surgery 03/05/2023 currently at SNF, a.fib on xarelto, prior stroke, hypothyroidism presented to ED after 2 episodes of slurred speech, difficulty with word finding and confusion at SNF which lasted for 15 to 20 minutes.  Has right ankle pain since hip surgery.  Admitted with acute stroke.   left cerebellar, L MCA/PCA, R MCA/ACA punctate acute ischemic strokes Likely due to atrial fibrillation with failure of Xarelto CTA head & neck  Negative CTA for large vessel occlusion or other emergent finding. Mild for age atheromatous disease without hemodynamically significant stenosis. Aortic atherosclerosis  MRI    Several punctate/small foci of restricted diffusion are seen: one in the superior left cerebellar hemisphere, one in the left occipital lobe, the bilateral frontal lobes and right parietal lobe, consistent with acute infarcts, likely embolic. Area of encephalomalacia and gliosis in the left frontoparietal region is also noted. Multiple small chronic infarcts in the bilateral cerebellar hemispheres. Moderate parenchymal volume loss.  VAS [place] LE DVT: Negative 2D Echo EF 55-60% LDL 57 HgbA1c 5.5 in 02/2023 VTE prophylaxis - Eliquis  Xarelto (rivaroxaban) daily prior to admission, switch to Eliquis 2.5 mg twice daily Therapy recommendations:  SNF   Hx of Stroke/TIA 02/2011 admitted for aphasia and right facial droop.  MRI: Left parietal infarct.  Changed from aspirin to Plavix, put on Lipitor.   Started on Coumadin around 05/2011 03/2013 admitted for transient left-sided weakness and slurring speech.  MRI negative.  Continue on Xarelto, and recommend to take with big meal   Paroxysmal Atrial fibrillation with RVR Home Meds: Xarelto, metoprolol Patient stated that she took Xarelto every day before meal Now switch to Eliquis 2.5 twice daily   Hypertension Home meds: Metoprolol 25 mg twice daily Stable   Hyperlipidemia Home meds: Crestor 10 mg, resumed in hospital LDL 57, goal < 70 No high intensity Crestor needed given LDL at goal Continue statin at discharge   Hypothyroidism, home Synthroid continued TSH normal and Free t4 normal   MVR in 2008  Right hip fracture status post  Right hip hemiarthroplasty, anterior approach 02/2023 Follow up recommendation from Orthopedics from recent outpt visit.   Consultants:  Neurology   Procedures performed:  Echocardiogram   DISCHARGE MEDICATION: Allergies as of 04/08/2023       Reactions   Tape Other (See Comments)   SKIN TEARS AND BRUISES VERY EASILY   Rofecoxib Other (See Comments)   Vioxx- Elevated LFT's Other Reaction(s): Not  available        Medication List     TAKE these medications    apixaban 2.5 MG Tabs tablet Commonly known as: ELIQUIS Take 1 tablet (2.5 mg total) by mouth 2 (two) times daily.   CITRACAL PETITES/VITAMIN D PO Take 1 tablet by mouth 2 (two) times daily.   docusate sodium 100 MG capsule Commonly known as: COLACE Take 1 capsule (100 mg total) by mouth 2 (two) times daily.   escitalopram 10 MG tablet Commonly known as: LEXAPRO Take 1 tablet (10 mg total) by mouth daily.   fluticasone 50 MCG/ACT nasal spray Commonly known as: FLONASE Place 2 sprays into both nostrils daily.   glucosamine-chondroitin 500-400 MG tablet Take 1 tablet by mouth 2 (two) times daily.   levothyroxine 75 MCG tablet Commonly known as: SYNTHROID TAKE 1 TABLET BY MOUTH EVERY DAY BEFORE BREAKFAST   loratadine 10 MG tablet Commonly known as: CLARITIN Take 1 tablet (10 mg total) by mouth daily.   methocarbamol 500 MG tablet Commonly known as: ROBAXIN Take 1 tablet (500 mg total) by mouth every 8 (eight) hours as needed for muscle spasms.   metoprolol tartrate 25 MG tablet Commonly known as: LOPRESSOR Take 1 tablet (25 mg total) by mouth 2 (two) times daily.   polyethylene glycol 17 g packet Commonly known as: MIRALAX / GLYCOLAX Take 17 g by mouth daily as needed for mild constipation.   PRESERVISION AREDS 2 PO Take 1 capsule by mouth 2 (two) times daily. Reported on 09/04/2015   rosuvastatin 10 MG tablet Commonly known as: CRESTOR TAKE 1 TABLET BY MOUTH DAILY. GENERIC EQUIVALENT FOR CRESTOR   Simply Saline 0.9 % Aers Generic drug: Saline Place 2 each into the nose daily as needed.   Tylenol 8 Hour Arthritis Pain 650 MG CR tablet Generic drug: acetaminophen Take 650 mg by mouth in the morning.   vitamin A 78295 UNIT capsule Take 1 capsule by mouth daily.   zinc oxide 20 % ointment Apply 1 Application topically as needed for irritation.       Disposition: SNF Diet recommendation:  Cardiac diet  Discharge Exam: Vitals:   04/08/23 0013 04/08/23 0421 04/08/23 0804 04/08/23 0806  BP: (!) 146/105 133/81 (!) 182/116 (!) 171/104  Pulse: (!) 107 (!) 104 (!) 117 95  Resp: 16 16    Temp: 98.2 F (36.8 C) 98.2 F (36.8 C)    TempSrc: Axillary Oral    SpO2: 97% 100% 98% 98%  Weight:      Height:       General: Appear in mild distress; no visible Abnormal Neck Mass Or lumps, Conjunctiva normal Cardiovascular: S1 and S2 Present, no Murmur, Respiratory: good respiratory effort, Bilateral Air entry present and CTA, no Crackles, no wheezes Abdomen: Bowel Sound present, Non tender  Extremities: no Pedal edema Neurology: alert and oriented to time, place, and person  Filed Weights   04/06/23 1318  Weight: 40.8 kg   Condition at discharge: stable  The results of significant diagnostics from this hospitalization (including imaging, microbiology, ancillary and laboratory) are listed below for reference.   Imaging Studies: ECHOCARDIOGRAM COMPLETE Result Date: 04/07/2023    ECHOCARDIOGRAM REPORT   Patient Name:  Theresa [name] Date of Exam: 04/07/2023 Medical Rec #:  [id number]          Height:       60.0 in Accession #:    [id number]         Weight:       90.0 lb Date of Birth:  01-13-1932          BSA:          1.329 m Patient Age:    89 years           BP:           136/89 mmHg Patient Gender: F                  HR:           105 bpm. Exam Location:  Inpatient Procedure: 2D Echo, Cardiac Doppler and Color Doppler Indications:    Stroke  History:        Patient has prior history of Echocardiogram examinations, most                 recent 03/22/2013. Arrythmias:Atrial Fibrillation.                  Mitral Valve: prosthetic annuloplasty ring valve is present in                 the mitral position.  Sonographer:    Karma Ganja Referring Phys: [id number] RAVI PAHWANI IMPRESSIONS  1. Left ventricular ejection fraction, by estimation, is 55 to 60%. The left ventricle has normal function.  The left ventricle has no regional wall motion abnormalities. Left ventricular diastolic function could not be evaluated.  2. Right ventricular systolic function is mildly reduced. The right ventricular size is mildly enlarged. There is moderately elevated pulmonary artery systolic pressure. The estimated right ventricular systolic pressure is 52.5 mmHg.  3. Left atrial size was severely dilated.  4. Right atrial size was severely dilated.  5. The mitral valve has been repaired/replaced. Mild to moderate mitral valve regurgitation. The mean mitral valve gradient is 4.3 mmHg with average heart rate of 90 bpm. There is a prosthetic annuloplasty ring present in the mitral position.  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 Tricuspid valve regurgitation is mild to moderate.  7. The aortic valve is tricuspid. Aortic valve regurgitation is not visualized. No aortic stenosis is present.  8. The inferior vena cava is dilated in size with <50% respiratory variability, suggesting right atrial pressure of 15 mmHg. Comparison(s): Prior images unable to be directly viewed, comparison made by report only. Mitral gradients are higher (previously 2 mm Hg), but this may be due to faster heart rate. Pulmonary artery pressure and right atrial pressure are now high. FINDINGS  Left Ventricle: Left ventricular ejection fraction, by estimation, is 55 to 60%. The left ventricle has normal function. The left ventricle has no regional wall motion abnormalities. The left ventricular internal cavity size was normal in size. There is  no left ventricular hypertrophy. Abnormal (paradoxical) septal motion consistent with post-operative status. Left ventricular diastolic function could not be evaluated due to mitral valve repair. Left ventricular diastolic function could not be evaluated. Right Ventricle: The right ventricular size is mildly enlarged. No increase in right ventricular wall thickness. Right ventricular systolic function is mildly  reduced. There is moderately elevated pulmonary artery systolic pressure. The tricuspid regurgitant velocity is 3.06 m/s, and with an assumed right atrial pressure of 15 mmHg, the estimated right ventricular  systolic pressure is 52.5 mmHg. Left Atrium: Left atrial size was severely dilated. Right Atrium: Right atrial size was severely dilated. Pericardium: There is no evidence of pericardial effusion. Mitral Valve: The mitral valve has been repaired/replaced. Mild to moderate mitral valve regurgitation, with centrally-directed jet. There is a prosthetic annuloplasty ring present in the mitral position. MV peak gradient, 11.1 mmHg. The mean mitral valve gradient is 4.3 mmHg with average heart rate of 90 bpm. Tricuspid Valve: The tricuspid valve is myxomatous. Tricuspid valve regurgitation is mild to moderate. There is mild prolapse of the tricuspid. Aortic Valve: The aortic valve is tricuspid. Aortic valve regurgitation is not visualized. No aortic stenosis is present. Aortic valve mean gradient measures 2.7 mmHg. Aortic valve peak gradient measures 4.8 mmHg. Aortic valve area, by VTI measures 1.82 cm. Pulmonic Valve: The pulmonic valve was grossly normal. Pulmonic valve regurgitation is trivial. No evidence of pulmonic stenosis. Aorta: The aortic root is normal in size and structure. Venous: The inferior vena cava is dilated in size with less than 50% respiratory variability, suggesting right atrial pressure of 15 mmHg. IAS/Shunts: No atrial level shunt detected by color flow Doppler.  LEFT VENTRICLE PLAX 2D LVIDd:         4.20 cm     Diastology LVIDs:         3.10 cm     LV e' medial:    6.63 cm/s LV PW:         1.10 cm     LV E/e' medial:  24.4 LV IVS:        1.10 cm     LV e' lateral:   12.70 cm/s LVOT diam:     1.90 cm     LV E/e' lateral: 12.7 LV SV:         32 LV SV Index:   24 LVOT Area:     2.84 cm  LV Volumes (MOD) LV vol d, MOD A2C: 78.1 ml LV vol d, MOD A4C: 57.8 ml LV vol s, MOD A2C: 36.5 ml LV vol s,  MOD A4C: 27.6 ml LV SV MOD A2C:     41.6 ml LV SV MOD A4C:     57.8 ml LV SV MOD BP:      34.7 ml RIGHT VENTRICLE RV Basal diam:  4.40 cm RV S prime:     8.67 cm/s TAPSE (M-mode): 1.3 cm LEFT ATRIUM              Index         RIGHT ATRIUM           Index LA diam:        4.70 cm  3.54 cm/m    RA Area:     27.50 cm LA Vol (A2C):   149.0 ml 112.08 ml/m  RA Volume:   83.60 ml  62.88 ml/m LA Vol (A4C):   76.0 ml  57.17 ml/m LA Biplane Vol: 110.0 ml 82.74 ml/m  AORTIC VALVE AV Area (Vmax):    1.80 cm AV Area (Vmean):   1.70 cm AV Area (VTI):     1.82 cm AV Vmax:           109.30 cm/s AV Vmean:          75.300 cm/s AV VTI:            0.176 m AV Peak Grad:      4.8 mmHg AV Mean Grad:      2.7 mmHg LVOT Vmax:  69.23 cm/s LVOT Vmean:        45.100 cm/s LVOT VTI:          0.113 m LVOT/AV VTI ratio: 0.64  AORTA Ao Root diam: 2.90 cm MITRAL VALVE                TRICUSPID VALVE MV Area (PHT): 3.49 cm     TR Peak grad:   37.5 mmHg MV Area VTI:   1.15 cm     TR Vmax:        306.00 cm/s MV Peak grad:  11.1 mmHg MV Mean grad:  4.3 mmHg     SHUNTS MV Vmax:       1.66 m/s     Systemic VTI:  0.11 m MV Vmean:      97.1 cm/s    Systemic Diam: 1.90 cm MR Peak grad: 116.6 mmHg MR Vmax:      540.00 cm/s MV E velocity: 161.67 cm/s Rachelle Hora Croitoru MD Electronically signed by Thurmon Fair MD Signature Date/Time: 04/07/2023/2:41:25 PM    Final    VAS [place] LOWER EXTREMITY VENOUS (DVT) Result Date: 04/07/2023  Lower Venous DVT Study Patient Name:  ALIS SIEMENS  Date of Exam:   04/07/2023 Medical Rec #: [id number]           Accession #:    [id number] Date of Birth: 03/27/1931           Patient Gender: F Patient Age:   4 years Exam Location:  [facility] Procedure:      VAS [place] LOWER EXTREMITY VENOUS (DVT) Referring Phys: Lyda Perone --------------------------------------------------------------------------------  Indications: Swelling.  Risk Factors: None identified. Anticoagulation: Eliquis. Comparison Study: No  prior studies. Performing Technologist: Chanda Busing RVT  Examination Guidelines: A complete evaluation includes B-mode imaging, spectral Doppler, color Doppler, and power Doppler as needed of all accessible portions of each vessel. Bilateral testing is considered an integral part of a complete examination. Limited examinations for reoccurring indications may be performed as noted. The reflux portion of the exam is performed with the patient in reverse Trendelenburg.  +---------+---------------+---------+-----------+----------+--------------+ RIGHT    CompressibilityPhasicitySpontaneityPropertiesThrombus Aging +---------+---------------+---------+-----------+----------+--------------+ CFV      Full           Yes      No                                  +---------+---------------+---------+-----------+----------+--------------+ SFJ      Full                                                        +---------+---------------+---------+-----------+----------+--------------+ FV Prox  Full                                                        +---------+---------------+---------+-----------+----------+--------------+ FV Mid   Full                                                        +---------+---------------+---------+-----------+----------+--------------+  FV DistalFull                                                        +---------+---------------+---------+-----------+----------+--------------+ PFV      Full                                                        +---------+---------------+---------+-----------+----------+--------------+ POP      Full           Yes      Yes                                 +---------+---------------+---------+-----------+----------+--------------+ PTV      Full                                                        +---------+---------------+---------+-----------+----------+--------------+ PERO     Full                                                         +---------+---------------+---------+-----------+----------+--------------+   +----+---------------+---------+-----------+----------+--------------+ LEFTCompressibilityPhasicitySpontaneityPropertiesThrombus Aging +----+---------------+---------+-----------+----------+--------------+ CFV Full           Yes      No                                  +----+---------------+---------+-----------+----------+--------------+    Summary: RIGHT: - There is no evidence of deep vein thrombosis in the lower extremity.  - No cystic structure found in the popliteal fossa.  LEFT: - No evidence of common femoral vein obstruction.   *See table(s) above for measurements and observations. Electronically signed by Lemar Livings MD on 04/07/2023 at 1:38:47 PM.    Final    CT ANGIO HEAD NECK W WO CM Result Date: 04/06/2023 CLINICAL DATA:  Follow-up examination for stroke. EXAM: CT ANGIOGRAPHY HEAD AND NECK WITH AND WITHOUT CONTRAST TECHNIQUE: Multidetector CT imaging of the head and neck was performed using the standard protocol during bolus administration of intravenous contrast. Multiplanar CT image reconstructions and MIPs were obtained to evaluate the vascular anatomy. Carotid stenosis measurements (when applicable) are obtained utilizing NASCET criteria, using the distal internal carotid diameter as the denominator. RADIATION DOSE REDUCTION: This exam was performed according to the departmental dose-optimization program which includes automated exposure control, adjustment of the mA and/or kV according to patient size and/or use of iterative reconstruction technique. CONTRAST:  75mL OMNIPAQUE IOHEXOL 350 MG/ML SOLN COMPARISON:  Prior brain MRI from earlier the same day. FINDINGS: CT HEAD FINDINGS Brain: Moderately advanced cerebral atrophy with mild chronic small vessel ischemic disease. Chronic left frontal lobe infarct. Few additional scattered small remote bilateral  cerebellar infarcts. Previously identified subcentimeter ischemic infarcts not visible by CT. No  other acute large vessel territory infarct. No acute intracranial hemorrhage. No mass lesion or midline shift. No hydrocephalus or extra-axial fluid collection. Vascular: No abnormal hyperdense vessel. Scattered vascular calcifications noted within the carotid siphons. Skull: Scalp soft tissues within normal limits.  Calvarium intact. Sinuses/Orbits: Globes and orbital soft tissues within normal limits. Paranasal sinuses and mastoid air cells are clear. Other: None. Review of the MIP images confirms the above findings CTA NECK FINDINGS Aortic arch: Visualized aortic arch within normal limits for caliber with standard branch pattern. Mild aortic atherosclerosis. No significant stenosis about the origin of the great vessels. Right carotid system: Right common and internal carotid arteries are patent without stenosis or dissection. Left carotid system: Left common and internal carotid arteries are patent without stenosis or dissection. Vertebral arteries: Both vertebral arteries arise from subclavian arteries. No proximal subclavian artery stenosis. Right vertebral artery dominant with a diffusely hypoplastic left vertebral artery. Vertebral arteries are patent without stenosis or dissection. Skeleton: Reversal of the normal cervical lordosis with advanced cervical spondylosis. Few probable benign hemangiomata noted. Other neck: No other acute finding. Upper chest: Small layering right pleural effusion. No other acute finding. Review of the MIP images confirms the above findings CTA HEAD FINDINGS Anterior circulation: Mild atheromatous change about the carotid siphons without hemodynamically significant stenosis. A1 segments patent bilaterally. Right A1 diminutive. Normal anterior communicating artery complex. Anterior cerebral arteries patent without significant stenosis. No M1 stenosis or occlusion. Distal MCA branches  perfused and symmetric. Posterior circulation: Dominant right V4 segment widely patent. Hypoplastic left vertebral artery largely terminates in PICA. Both PICA patent. Basilar patent without stenosis. Superior cerebellar and posterior cerebral arteries widely patent bilaterally. Venous sinuses: Grossly patent allowing for timing the contrast bolus. Anatomic variants: As above.  No aneurysm. Review of the MIP images confirms the above findings IMPRESSION: CT HEAD: 1. No acute intracranial abnormality. Previously identified subcentimeter ischemic infarcts not visible by CT. 2. Underlying moderately advanced cerebral atrophy with mild chronic small vessel ischemic disease, with chronic left frontal lobe infarct. Few additional scattered small remote bilateral cerebellar infarcts. CTA HEAD AND NECK: 1. Negative CTA for large vessel occlusion or other emergent finding. 2. Mild for age atheromatous disease without hemodynamically significant stenosis. 3.  Aortic Atherosclerosis (ICD10-I70.0). 4. Small layering right pleural effusion. Electronically Signed   By: Rise Mu M.D.   On: 04/06/2023 20:05   MR BRAIN WO CONTRAST Result Date: 04/06/2023 CLINICAL DATA:  Transient ischemic attack. EXAM: MRI HEAD WITHOUT CONTRAST TECHNIQUE: Multiplanar, multiecho pulse sequences of the brain and surrounding structures were obtained without intravenous contrast. COMPARISON:  MRI of the brain September 21, 2019. FINDINGS: The study was terminated prematurely due to patient inability to lie still in the scanner. Some of the images obtained are degraded by motion. Brain: Multiple small foci of restricted diffusion are seen in the superior left cerebellar hemisphere, left occipital lobe, bilateral frontal lobes and right parietal lobe, consistent with acute infarcts, likely embolic. No hemorrhage, hydrocephalus, extra-axial collection or mass lesion. Area of encephalomalacia and gliosis in the left frontoparietal region. Multiple  small chronic infarcts in the bilateral cerebellar hemispheres. Moderate parenchymal volume loss. Vascular: Normal flow voids. Skull and upper cervical spine: Grossly unremarkable. Sinuses/Orbits: Negative. Other: None. IMPRESSION: 1. Multiple small foci of restricted diffusion in the superior left cerebellar hemisphere, left occipital lobe, bilateral frontal lobes and right parietal lobe, consistent with acute infarcts, likely embolic. 2. Area of encephalomalacia and gliosis in the left frontoparietal region. 3. Multiple small chronic  infarcts in the bilateral cerebellar hemispheres. 4. Moderate parenchymal volume loss. Electronically Signed   By: Baldemar Lenis M.D.   On: 04/06/2023 15:48    Microbiology: Results for orders placed or performed during the hospital encounter of 04/06/23  MRSA Next Gen by PCR, Nasal     Status: None   Collection Time: 04/07/23  8:13 AM   Specimen: Nasal Mucosa; Nasal Swab  Result Value Ref Range Status   MRSA by PCR Next Gen NOT DETECTED NOT DETECTED Final    Comment: (NOTE) The GeneXpert MRSA Assay (FDA approved for NASAL specimens only), is one component of a comprehensive MRSA colonization surveillance program. It is not intended to diagnose MRSA infection nor to guide or monitor treatment for MRSA infections. Test performance is not FDA approved in patients less than 14 years old. Performed at [facility], [address], [place], [place] [address]    Labs: CBC: Recent Labs  Lab 04/06/23 1326  WBC 10.5  NEUTROABS 8.0*  HGB 11.9*  HCT 37.9  MCV 101.6*  PLT 191   Basic Metabolic Panel: Recent Labs  Lab 04/06/23 1326 04/08/23 0438 04/08/23 0439  NA 137 140  --   K 5.0 3.6  --   CL 101 105  --   CO2 26 23  --   GLUCOSE 98 89  --   BUN 25* 24*  --   CREATININE 0.94 0.89  --   CALCIUM 9.3 8.6*  --   MG  --   --  2.0   Liver Function Tests: Recent Labs  Lab 04/06/23 1326  AST 33  ALT 28  ALKPHOS 159*  BILITOT  0.7  PROT 6.2*  ALBUMIN 3.6   CBG: Recent Labs  Lab 04/06/23 1317  GLUCAP 73    Discharge time spent: greater than 30 minutes.  Author: Lynden Oxford, MD  [facility]

## 2023-04-08 NOTE — TOC Transition Note (Signed)
Transition of Care [facility] - Discharge Note   Patient Details  Name: Theresa [name] MRN: [id number] Date of Birth: September 23, 1931  Transition of Care [facility]) CM/SW Contact:  Yaslin Kirtley Felipa Emory, Student-Social Work Phone Number: 04/08/2023, 12:58 PM   Clinical Narrative:  MSW Student and CSW confirmed with MD that patient is stable for discharge to [facility]. MSW Student and CSW confirmed with [facility] they were ready for patient to admit. MSW Student spoke with friend Marjean Donna and let her know of transport. MSW Student arranged transport with [name] and patient is scheduled for next available.     Final next level of care: Skilled Nursing Facility Barriers to Discharge: Barriers Resolved   Patient Goals and CMS Choice Patient states their goals for this hospitalization and ongoing recovery are:: to get back home CMS Medicare.gov Compare Post Acute Care list provided to:: Patient Choice offered to / list presented to : Patient [place] ownership interest in [facility].provided to:: Patient    Discharge Placement              Patient chooses bed at: [facility] Patient to be transferred to facility by: [name] Name of family member notified: Marjean Donna Patient and family notified of of transfer: 04/08/23  Discharge Plan and Services Additional resources added to the After Visit Summary for       Post Acute Care Choice: Skilled Nursing Facility                               Social Drivers of Health (SDOH) Interventions SDOH Screenings   Food Insecurity: No Food Insecurity (04/07/2023)  Housing: Low Risk  (04/07/2023)  Transportation Needs: No Transportation Needs (04/07/2023)  Utilities: Not At Risk (04/07/2023)  Alcohol Screen: Low Risk  (12/12/2020)  Depression (PHQ2-9): Low Risk  (03/02/2023)  Financial Resource Strain: Low Risk  (03/02/2023)  Physical Activity: Insufficiently Active (03/02/2023)  Social Connections: Moderately Isolated  (04/07/2023)  Stress: No Stress Concern Present (03/02/2023)  Tobacco Use: Medium Risk (04/06/2023)  Health Literacy: Adequate Health Literacy (03/02/2023)     Readmission Risk Interventions     No data to display

## 2023-04-08 NOTE — NC FL2 (Signed)
[place] MEDICAID FL2 LEVEL OF CARE FORM     IDENTIFICATION  Patient Name: Theresa [name] Birthdate: 1931/06/22 Sex: female Admission Date (Current Location): 04/06/2023  [facility] and [place] Number:  Chiropodist and Address:  [facility], [address] [place], [place] [address]      Provider Number: [id number]  Attending Physician Name and Address:  Rolly Salter, MD  Relative Name and Phone Number:       Current Level of Care: SNF Recommended Level of Care: Skilled Nursing Facility Prior Approval Number:    Date Approved/Denied:   PASRR Number: [id number]  Discharge Plan: SNF    Current Diagnoses: Patient Active Problem List   Diagnosis Date Noted   Acute embolic stroke (HCC) 04/06/2023   Pain and swelling of right lower leg 04/06/2023   Atrial fibrillation, chronic (HCC) 03/09/2023   Fracture of femoral neck, right, closed (HCC) 03/03/2023   Paroxysmal atrial fibrillation with RVR (HCC) 03/03/2023   Depression 03/03/2023   Pain due to onychomycosis of toenails of both feet 12/23/2019   Balance disorder 05/03/2018   Macular degeneration 03/31/2017   Difficulty swallowing pills 11/26/2016   Hyperlipidemia 09/01/2014   CKD (chronic kidney disease), stage III (HCC) 03/02/2014   Rosacea 11/23/2013   Major depression in full remission (HCC) 11/23/2013   TIA (transient ischemic attack) 03/21/2013   Hypertension 03/04/2012   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 05/30/2011   Hx of ischemic left MCA stroke 04/08/2011   History of colonic polyps 10/22/2009   CHANGE IN BOWELS 09/20/2009   GASTROESOPHAGEAL REFLUX DISEASE, MILD 06/04/2009   Raynaud's syndrome 03/06/2009   Idiopathic scoliosis and kyphoscoliosis 01/03/2009   Paroxysmal atrial fibrillation (HCC) 07/07/2007   Hypothyroidism 11/23/2006   Osteopenia 11/23/2006   History of mitral valve repair 09/11/2006   Osteoarthritis 09/11/2006    Orientation  RESPIRATION BLADDER Height & Weight     Self, Time, Situation, Place  O2 Incontinent Weight: 90 lb (40.8 kg) Height:  5' (152.4 cm)  BEHAVIORAL SYMPTOMS/MOOD NEUROLOGICAL BOWEL NUTRITION STATUS      Continent Diet (Heart Room)  AMBULATORY STATUS COMMUNICATION OF NEEDS Skin   Limited Assist Verbally Surgical wounds (Hip Anterior, closed)                       Personal Care Assistance Level of Assistance  Bathing, Feeding, Dressing Bathing Assistance: Limited assistance Feeding assistance: Independent Dressing Assistance: Limited assistance     Functional Limitations Info  Sight Sight Info: Impaired        SPECIAL CARE FACTORS FREQUENCY  PT (By licensed PT), OT (By licensed OT)     PT Frequency: 5x/week OT Frequency: 5x/week            Contractures      Additional Factors Info  Code Status, Allergies Code Status Info: Full Allergies Info: Tape, Rofecoxib           Current Medications (04/08/2023):  This is the current hospital active medication list Current Facility-Administered Medications  Medication Dose Route Frequency Provider Last Rate Last Admin   acetaminophen (TYLENOL) tablet 650 mg  650 mg Oral Q4H PRN Hillary Bow, DO       Or   acetaminophen (TYLENOL) 160 MG/5ML solution 650 mg  650 mg Per Tube Q4H PRN Hillary Bow, DO       Or   acetaminophen (TYLENOL) suppository 650 mg  650 mg Rectal Q4H PRN Julian Reil,  Heywood Iles, DO       acetaminophen (TYLENOL) tablet 650 mg  650 mg Oral q AM Hillary Bow, DO   650 mg at 04/08/23 0603   apixaban (ELIQUIS) tablet 2.5 mg  2.5 mg Oral BID Hillary Bow, DO   2.5 mg at 04/08/23 4098   escitalopram (LEXAPRO) tablet 10 mg  10 mg Oral Daily Hillary Bow, DO   10 mg at 04/08/23 1191   levothyroxine (SYNTHROID) tablet 75 mcg  75 mcg Oral Q0600 Hillary Bow, DO   75 mcg at 04/08/23 0603   loratadine (CLARITIN) tablet 10 mg  10 mg Oral Daily Lyda Perone M, DO   10 mg at 04/08/23 4782   metoprolol  tartrate (LOPRESSOR) tablet 25 mg  25 mg Oral BID Rolly Salter, MD   25 mg at 04/08/23 9562   polyethylene glycol (MIRALAX / GLYCOLAX) packet 17 g  17 g Oral Daily PRN Hillary Bow, DO       rosuvastatin (CRESTOR) tablet 10 mg  10 mg Oral QHS Lyda Perone M, DO   10 mg at 04/07/23 2111     Discharge Medications: Please see discharge summary for a list of discharge medications.  Relevant Imaging Results:  Relevant Lab Results:   Additional Information SS# [id number]  Dragon [employer]ush Felipa Emory, Student-Social Work

## 2023-04-08 NOTE — TOC Progression Note (Signed)
Transition of Care [facility]) - Progression Note    Patient Details  Name: Theresa [name] MRN: [id number] Date of Birth: 23-Jun-1931  Transition of Care [facility]) CM/SW Contact  Baldemar Lenis, [place] Phone Number: 04/08/2023, 10:39 AM  Clinical Narrative:   CSW received call after hours from [facility] that they would have a bed available for the patient. CSW spoke with Marjean Donna to provide update, and she confirmed that she would prefer to keep the patient at [facility] to reduce another environment change. CSW updated [facility], and left a voicemail for DON to update on patient hopefully returning today. CSW to follow.    Expected Discharge Plan: Skilled Nursing Facility Barriers to Discharge: Continued Medical Work up  Expected Discharge Plan and Services     Post Acute Care Choice: Skilled Nursing Facility Living arrangements for the past 2 months: Skilled Nursing Facility                                       Social Determinants of Health (SDOH) Interventions SDOH Screenings   Food Insecurity: No Food Insecurity (04/07/2023)  Housing: Low Risk  (04/07/2023)  Transportation Needs: No Transportation Needs (04/07/2023)  Utilities: Not At Risk (04/07/2023)  Alcohol Screen: Low Risk  (12/12/2020)  Depression (PHQ2-9): Low Risk  (03/02/2023)  Financial Resource Strain: Low Risk  (03/02/2023)  Physical Activity: Insufficiently Active (03/02/2023)  Social Connections: Moderately Isolated (04/07/2023)  Stress: No Stress Concern Present (03/02/2023)  Tobacco Use: Medium Risk (04/06/2023)  Health Literacy: Adequate Health Literacy (03/02/2023)    Readmission Risk Interventions     No data to display

## 2023-04-08 NOTE — Progress Notes (Signed)
Patient wheeled off unit by [name].

## 2023-04-08 NOTE — TOC Initial Note (Signed)
Transition of Care [facility]) - Initial/Assessment Note    Patient Details  Name: Theresa [name] MRN: [id number] Date of Birth: 1931/09/10  Transition of Care [facility]) CM/SW Contact:    Baldemar Lenis, LCSW Phone Number: 04/08/2023, 10:34 AM  Clinical Narrative:       CSW met with patient to discuss return to SNF. Patient engaged in conversation mostly well, but did say a few things that did not make sense, and requested that CSW contact her HCPOA, Marjean Donna, to ensure decisions were appropriate.   CSW spoke with Marjean Donna about return to SNF. Patient and spouse recently moved into [facility] and patient had her fall and hip surgery two weeks after moving in. 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 CSW removed spouse from emergency contact list per Eileen's report that he has dementia and cannot assist with decision making anymore.   Patient had been placed in [facility] because [facility] did not have a bed. CSW had contacted [facility] to ask about bed availability and has not heard back. Marjean Donna would most likely prefer to return to [facility] to reduce more changes in environment for the patient. CSW to follow.   Expected Discharge Plan: Skilled Nursing Facility Barriers to Discharge: Continued Medical Work up   Patient Goals and CMS Choice Patient states their goals for this hospitalization and ongoing recovery are:: to get back home CMS Medicare.gov Compare Post Acute Care list provided to:: Patient Choice offered to / list presented to : Patient [place] ownership interest in [facility].provided to:: Patient    Expected Discharge Plan and Services     Post Acute Care Choice: Skilled Nursing Facility Living arrangements for the past 2 months: Skilled Nursing Facility                                      Prior Living Arrangements/Services Living arrangements for the past 2 months: Skilled Nursing  Facility Lives with:: Facility Resident Patient language and need for interpreter reviewed:: No Do you feel safe going back to the place where you live?: Yes      Need for Family Participation in Patient Care: Yes (Comment) Care giver support system in place?: Yes (comment)   Criminal Activity/Legal Involvement Pertinent to Current Situation/Hospitalization: No - Comment as needed  Activities of Daily Living   ADL Screening (condition at time of admission) Independently performs ADLs?: No Does the patient have a NEW difficulty with bathing/dressing/toileting/self-feeding that is expected to last >3 days?: No (already ordered) Does the patient have a NEW difficulty with getting in/out of bed, walking, or climbing stairs that is expected to last >3 days?: No (already ordered) Does the patient have a NEW difficulty with communication that is expected to last >3 days?: No (SLP already ordered) Is the patient deaf or have difficulty hearing?: No Does the patient have difficulty seeing, even when wearing glasses/contacts?: No Does the patient have difficulty concentrating, remembering, or making decisions?: Yes (intermittent)  Permission Sought/Granted Permission sought to share information with : Facility Medical sales representative, Family Supports Permission granted to share information with : Yes, Verbal Permission Granted  Share Information with NAME: Marjean Donna  Permission granted to share info w AGENCY: [facility]  Permission granted to share info w Relationship: Friend/HCPOA     Emotional Assessment Appearance:: Appears stated age Attitude/Demeanor/Rapport: Engaged Affect (typically observed):  Appropriate Orientation: : Oriented to Self, Oriented to Place, Oriented to  Time, Oriented to Situation Alcohol / Substance Use: Not Applicable Psych Involvement: No (comment)  Admission diagnosis:  Acute embolic stroke [facility]) [I63.9] Cerebrovascular accident (CVA) due to embolism of other  cerebral artery (HCC) [I63.49] Patient Active Problem List   Diagnosis Date Noted   Acute embolic stroke (HCC) 04/06/2023   Pain and swelling of right lower leg 04/06/2023   Atrial fibrillation, chronic (HCC) 03/09/2023   Fracture of femoral neck, right, closed (HCC) 03/03/2023   Paroxysmal atrial fibrillation with RVR (HCC) 03/03/2023   Depression 03/03/2023   Pain due to onychomycosis of toenails of both feet 12/23/2019   Balance disorder 05/03/2018   Macular degeneration 03/31/2017   Difficulty swallowing pills 11/26/2016   Hyperlipidemia 09/01/2014   CKD (chronic kidney disease), stage III (HCC) 03/02/2014   Rosacea 11/23/2013   Major depression in full remission (HCC) 11/23/2013   TIA (transient ischemic attack) 03/21/2013   Hypertension 03/04/2012   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 05/30/2011   Hx of ischemic left MCA stroke 04/08/2011   History of colonic polyps 10/22/2009   CHANGE IN BOWELS 09/20/2009   GASTROESOPHAGEAL REFLUX DISEASE, MILD 06/04/2009   Raynaud's syndrome 03/06/2009   Idiopathic scoliosis and kyphoscoliosis 01/03/2009   Paroxysmal atrial fibrillation (HCC) 07/07/2007   Hypothyroidism 11/23/2006   Osteopenia 11/23/2006   History of mitral valve repair 09/11/2006   Osteoarthritis 09/11/2006   PCP:  Shelva Majestic, MD Pharmacy:   [id number] [place], [place] - [address] [place] [address] Phone: [phone] Fax: [phone]  [facility] [address] [place] [address] Phone: [phone] Fax: [phone]  [facility] [address] [place] [address] Phone: [phone] Fax: [phone]     Social Drivers of Health (SDOH) Social History: SDOH Screenings   Food Insecurity: No Food Insecurity (04/07/2023)  Housing: Low Risk  (04/07/2023)  Transportation Needs: No Transportation Needs (04/07/2023)  Utilities: Not At  Risk (04/07/2023)  Alcohol Screen: Low Risk  (12/12/2020)  Depression (PHQ2-9): Low Risk  (03/02/2023)  Financial Resource Strain: Low Risk  (03/02/2023)  Physical Activity: Insufficiently Active (03/02/2023)  Social Connections: Moderately Isolated (04/07/2023)  Stress: No Stress Concern Present (03/02/2023)  Tobacco Use: Medium Risk (04/06/2023)  Health Literacy: Adequate Health Literacy (03/02/2023)   SDOH Interventions:     Readmission Risk Interventions     No data to display

## 2023-04-09 ENCOUNTER — Non-Acute Institutional Stay (SKILLED_NURSING_FACILITY): Payer: Self-pay | Admitting: Internal Medicine

## 2023-04-09 ENCOUNTER — Encounter: Payer: Self-pay | Admitting: Internal Medicine

## 2023-04-09 DIAGNOSIS — R410 Disorientation, unspecified: Secondary | ICD-10-CM

## 2023-04-09 DIAGNOSIS — R41841 Cognitive communication deficit: Secondary | ICD-10-CM | POA: Diagnosis not present

## 2023-04-09 DIAGNOSIS — R4781 Slurred speech: Secondary | ICD-10-CM | POA: Diagnosis not present

## 2023-04-09 DIAGNOSIS — Z8781 Personal history of (healed) traumatic fracture: Secondary | ICD-10-CM

## 2023-04-09 DIAGNOSIS — I639 Cerebral infarction, unspecified: Secondary | ICD-10-CM | POA: Diagnosis not present

## 2023-04-09 DIAGNOSIS — E034 Atrophy of thyroid (acquired): Secondary | ICD-10-CM

## 2023-04-09 DIAGNOSIS — S7291XA Unspecified fracture of right femur, initial encounter for closed fracture: Secondary | ICD-10-CM | POA: Diagnosis not present

## 2023-04-09 DIAGNOSIS — Z4789 Encounter for other orthopedic aftercare: Secondary | ICD-10-CM | POA: Diagnosis not present

## 2023-04-09 DIAGNOSIS — R269 Unspecified abnormalities of gait and mobility: Secondary | ICD-10-CM | POA: Diagnosis not present

## 2023-04-09 DIAGNOSIS — M6281 Muscle weakness (generalized): Secondary | ICD-10-CM | POA: Diagnosis not present

## 2023-04-09 DIAGNOSIS — I482 Chronic atrial fibrillation, unspecified: Secondary | ICD-10-CM

## 2023-04-09 DIAGNOSIS — Z9181 History of falling: Secondary | ICD-10-CM | POA: Diagnosis not present

## 2023-04-09 NOTE — Progress Notes (Signed)
[facility]: [facility] Service:  SNF ([id number])  Provider:   Code Status:  Goals of Care:     04/06/2023    2:20 PM  Advanced Directives  Does Patient Have a Medical Advance Directive? Yes  Type of Advance Directive Healthcare Power of Attorney  Does patient want to make changes to medical advance directive? No - Patient declined  Copy of Healthcare Power of Attorney in Chart? No - copy requested, Physician notified     Chief Complaint  Patient presents with   Acute Visit    HPI: Patient is a 89 y.o. female seen today for an acute visit for Readmit to SNF  Patient was admitted in the hospital from 03/2199 22 for acute CVA  Patient has been staying in SNF facility after sustaining acute fracture of right femoral neck She also has a history of left MCA stroke in 2015, atrial fibrillation, hypertension, hypothyroidism, GERD, CKD, depression   Patient was sent to the hospital after she was seen to have confusion and difficulty finding words and slurred speech. Her MRI showed acute punctate embolic stroke in the left cerebellar, bilateral frontal lobes and right parietal lobe She was already on Xarelto due to her history of PAF.  Her Xarelto was discontinued and she was started on Eliquis 2.5 mg twice daily.  Her vascular ultrasound of lower extremity was negative.  Her echo was negative EF 55 to 60%  Patient came back to the facility yesterday and nurses she was confused And was trying to hit them .  Patient states that she just wanted to continue doing her ADLs without any help Patient seems back to her baseline today already walking with her walker denies any dizziness or any weakness Her speech is back to normal Patient was very emotional during the exam.  She is aware that she is the primary caregiver for her husband who has dementia.  She has deciding for him to stay in [place] Past Medical History:  Diagnosis Date   Atrial fibrillation (HCC)    AFIB    Collagenous colitis    CVA (cerebral vascular accident) (HCC)    a.  L parietal CVA by MRI 12/12, likely embolic;  b.  TEE by report with neg bubble study;  c.  carotid dopplers  12/12:  + plaque, no sig ICA stenosis    History of postmenopausal HRT    Hx of adenomatous colonic polyps    Hypothyroidism    MVP (mitral valve prolapse)    a. s/p MV repair at [facility] in 2008;  b. Echocardiogram 7/12: EF 50%, status post mitral valve repair with mild MR and minimal MS, mean gradient 4, moderate LAE, LA diam 55 mm; mild RAE, PASP 28-32;    Osteoarthritis    Osteoporosis     Past Surgical History:  Procedure Laterality Date   ANTERIOR APPROACH HEMI HIP ARTHROPLASTY Right 03/05/2023   Procedure: ANTERIOR APPROACH HEMI HIP ARTHROPLASTY;  Surgeon: Samson Frederic, MD;  Location: [facility] ORS;  Service: Orthopedics;  Laterality: Right;   CARDIOVERSION  01/09/2012   Procedure: CARDIOVERSION;  Surgeon: Dolores Patty, MD;  Location: [facility] ENDOSCOPY;  Service: Cardiovascular;  Laterality: N/A;   CATARACT EXTRACTION     CHOLECYSTECTOMY     DILATION AND CURETTAGE OF UTERUS     LUMBAR FUSION     MITRAL VALVE REPAIR  03/17/2006   "mitral valve repair CE ring and maze procedure"   TONSILLECTOMY  Allergies  Allergen Reactions   Tape Other (See Comments)    SKIN TEARS AND BRUISES VERY EASILY   Rofecoxib Other (See Comments)    Vioxx- Elevated LFT's  Other Reaction(s): Not available    Outpatient Encounter Medications as of 04/09/2023  Medication Sig   apixaban (ELIQUIS) 2.5 MG TABS tablet Take 1 tablet (2.5 mg total) by mouth 2 (two) times daily.   Calcium Citrate-Vitamin D (CITRACAL PETITES/VITAMIN D PO) Take 1 tablet by mouth 2 (two) times daily.   docusate sodium (COLACE) 100 MG capsule Take 1 capsule (100 mg total) by mouth 2 (two) times daily.   escitalopram (LEXAPRO) 10 MG tablet Take 1 tablet (10 mg total) by mouth daily.   fluticasone (FLONASE) 50 MCG/ACT nasal spray Place 2 sprays into both  nostrils daily.   glucosamine-chondroitin 500-400 MG tablet Take 1 tablet by mouth 2 (two) times daily.   levothyroxine (SYNTHROID) 75 MCG tablet TAKE 1 TABLET BY MOUTH EVERY DAY BEFORE BREAKFAST   loratadine (CLARITIN) 10 MG tablet Take 1 tablet (10 mg total) by mouth daily.   methocarbamol (ROBAXIN) 500 MG tablet Take 1 tablet (500 mg total) by mouth every 8 (eight) hours as needed for muscle spasms.   metoprolol tartrate (LOPRESSOR) 25 MG tablet Take 1 tablet (25 mg total) by mouth 2 (two) times daily.   Multiple Vitamins-Minerals (PRESERVISION AREDS 2 PO) Take 1 capsule by mouth 2 (two) times daily. Reported on 09/04/2015   polyethylene glycol (MIRALAX / GLYCOLAX) 17 g packet Take 17 g by mouth daily as needed for mild constipation.   rosuvastatin (CRESTOR) 10 MG tablet TAKE 1 TABLET BY MOUTH DAILY. GENERIC EQUIVALENT FOR CRESTOR   Saline (SIMPLY SALINE) 0.9 % AERS Place 2 each into the nose daily as needed.   TYLENOL 8 HOUR ARTHRITIS PAIN 650 MG CR tablet Take 650 mg by mouth in the morning.   vitamin A 16109 UNIT capsule Take 1 capsule by mouth daily.   zinc oxide 20 % ointment Apply 1 Application topically as needed for irritation.   No facility-administered encounter medications on file as of 04/09/2023.    Review of Systems:  Review of Systems  Constitutional:  Negative for activity change and appetite change.  HENT: Negative.    Respiratory:  Negative for cough and shortness of breath.   Cardiovascular:  Negative for leg swelling.  Gastrointestinal:  Negative for constipation.  Genitourinary: Negative.   Musculoskeletal:  Negative for arthralgias, gait problem and myalgias.  Skin: Negative.   Neurological:  Negative for dizziness and weakness.  Psychiatric/Behavioral:  Positive for confusion and dysphoric mood. Negative for sleep disturbance. The patient is nervous/anxious.     Health Maintenance  Topic Date Due   COVID-19 Vaccine (5 - 2024-25 season) 11/16/2022   Medicare  Annual Wellness (AWV)  03/01/2024   DTaP/Tdap/Td (5 - Td or Tdap) 02/23/2031   Pneumonia Vaccine 7+ Years old  Completed   INFLUENZA VACCINE  Completed   DEXA SCAN  Completed   Zoster Vaccines- Shingrix  Completed   HPV VACCINES  Aged Out    Physical Exam: Vitals:   04/09/23 1325  BP: 125/66  Pulse: 99  Resp: 19  Temp: 98 F (36.7 C)  Weight: 95 lb 9.6 oz (43.4 kg)   Body mass index is 18.67 kg/m. Physical Exam Vitals reviewed.  Constitutional:      Appearance: Normal appearance.  HENT:     Head: Normocephalic.     Nose: Nose normal.  Mouth/Throat:     Mouth: Mucous membranes are moist.     Pharynx: Oropharynx is clear.  Eyes:     Pupils: Pupils are equal, round, and reactive to light.  Cardiovascular:     Rate and Rhythm: Tachycardia present. Rhythm irregular.     Pulses: Normal pulses.     Heart sounds: Normal heart sounds. No murmur heard. Pulmonary:     Effort: Pulmonary effort is normal.     Breath sounds: Normal breath sounds.  Abdominal:     General: Abdomen is flat. Bowel sounds are normal.     Palpations: Abdomen is soft.  Musculoskeletal:        General: No swelling.     Cervical back: Neck supple.  Skin:    General: Skin is warm.  Neurological:     General: No focal deficit present.     Mental Status: She is alert and oriented to person, place, and time.  Psychiatric:        Mood and Affect: Mood normal.        Thought Content: Thought content normal.     Labs reviewed: Basic Metabolic Panel: Recent Labs    03/05/23 0404 03/05/23 0748 03/06/23 0337 03/07/23 0352 03/08/23 0352 04/06/23 1326 04/08/23 0438 04/08/23 0439  NA  --    < > 130* 133* 139 137 140  --   K  --    < > 4.0 4.6 4.4 5.0 3.6  --   CL  --    < > 97* 98 103 101 105  --   CO2  --    < > 26 29 30 26 23   --   GLUCOSE  --    < > 154* 131* 95 98 89  --   BUN  --    < > 25* 34* 27* 25* 24*  --   CREATININE  --    < > 0.93 0.89 0.64 0.94 0.89  --   CALCIUM  --    < >  7.7* 8.5* 8.6* 9.3 8.6*  --   MG  --    < >  --  2.1 2.2  --   --  2.0  PHOS  --    < > 3.1 1.6* 3.1  --   --   --   TSH 1.775  --   --   --   --   --  1.862  --    < > = values in this interval not displayed.   Liver Function Tests: Recent Labs    05/26/22 1200 03/03/23 2200 03/05/23 0748 03/07/23 0352 03/08/23 0352 04/06/23 1326  AST 28 29  --   --   --  33  ALT 36* 42  --   --   --  28  ALKPHOS 73 81  --   --   --  159*  BILITOT 0.4 0.7  --   --   --  0.7  PROT 6.3 5.9*  --   --   --  6.2*  ALBUMIN 4.2 3.6   < > 2.8* 2.8* 3.6   < > = values in this interval not displayed.   No results for input(s): "LIPASE", "AMYLASE" in the last 8760 hours. No results for input(s): "AMMONIA" in the last 8760 hours. CBC: Recent Labs    03/03/23 2124 03/05/23 0748 03/07/23 0352 03/08/23 0352 04/06/23 1326  WBC 10.9*   < > 13.3* 9.5 10.5  NEUTROABS 9.0*  --   --   --  8.0*  HGB 13.4   < > 11.7* 11.4* 11.9*  HCT 41.0   < > 36.3 34.7* 37.9  MCV 102.2*   < > 100.6* 99.7 101.6*  PLT 150   < > 173 167 191   < > = values in this interval not displayed.   Lipid Panel: Recent Labs    04/07/23 0502  CHOL 132  HDL 66  LDLCALC 57  TRIG 43  CHOLHDL 2.0   Lab Results  Component Value Date   HGBA1C 5.5 03/06/2023    Procedures since last visit: ECHOCARDIOGRAM COMPLETE Result Date: 04/07/2023    ECHOCARDIOGRAM REPORT   Patient Name:   Theresa [name] Date of Exam: 04/07/2023 Medical Rec #:  [id number]          Height:       60.0 in Accession #:    [id number]         Weight:       90.0 lb Date of Birth:  [birth year]          BSA:          1.329 m Patient Age:    89 years           BP:           136/89 mmHg Patient Gender: F                  HR:           105 bpm. Exam Location:  Inpatient Procedure: 2D Echo, Cardiac Doppler and Color Doppler Indications:    Stroke  History:        Patient has prior history of Echocardiogram examinations, most                 recent 03/22/2013.  Arrythmias:Atrial Fibrillation.                  Mitral Valve: prosthetic annuloplasty ring valve is present in                 the mitral position.  Sonographer:    Karma Ganja Referring Phys: [id number] RAVI PAHWANI IMPRESSIONS  1. Left ventricular ejection fraction, by estimation, is 55 to 60%. The left ventricle has normal function. The left ventricle has no regional wall motion abnormalities. Left ventricular diastolic function could not be evaluated.  2. Right ventricular systolic function is mildly reduced. The right ventricular size is mildly enlarged. There is moderately elevated pulmonary artery systolic pressure. The estimated right ventricular systolic pressure is 52.5 mmHg.  3. Left atrial size was severely dilated.  4. Right atrial size was severely dilated.  5. The mitral valve has been repaired/replaced. Mild to moderate mitral valve regurgitation. The mean mitral valve gradient is 4.3 mmHg with average heart rate of 90 bpm. There is a prosthetic annuloplasty ring present in the mitral position.  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 Tricuspid valve regurgitation is mild to moderate.  7. The aortic valve is tricuspid. Aortic valve regurgitation is not visualized. No aortic stenosis is present.  8. The inferior vena cava is dilated in size with <50% respiratory variability, suggesting right atrial pressure of 15 mmHg. Comparison(s): Prior images unable to be directly viewed, comparison made by report only. Mitral gradients are higher (previously 2 mm Hg), but this may be due to faster heart rate. Pulmonary artery pressure and right atrial pressure are now high. FINDINGS  Left Ventricle: Left ventricular ejection fraction, by estimation, is 55 to 60%.  The left ventricle has normal function. The left ventricle has no regional wall motion abnormalities. The left ventricular internal cavity size was normal in size. There is  no left ventricular hypertrophy. Abnormal (paradoxical) septal motion consistent  with post-operative status. Left ventricular diastolic function could not be evaluated due to mitral valve repair. Left ventricular diastolic function could not be evaluated. Right Ventricle: The right ventricular size is mildly enlarged. No increase in right ventricular wall thickness. Right ventricular systolic function is mildly reduced. There is moderately elevated pulmonary artery systolic pressure. The tricuspid regurgitant velocity is 3.06 m/s, and with an assumed right atrial pressure of 15 mmHg, the estimated right ventricular systolic pressure is 52.5 mmHg. Left Atrium: Left atrial size was severely dilated. Right Atrium: Right atrial size was severely dilated. Pericardium: There is no evidence of pericardial effusion. Mitral Valve: The mitral valve has been repaired/replaced. Mild to moderate mitral valve regurgitation, with centrally-directed jet. There is a prosthetic annuloplasty ring present in the mitral position. MV peak gradient, 11.1 mmHg. The mean mitral valve gradient is 4.3 mmHg with average heart rate of 90 bpm. Tricuspid Valve: The tricuspid valve is myxomatous. Tricuspid valve regurgitation is mild to moderate. There is mild prolapse of the tricuspid. Aortic Valve: The aortic valve is tricuspid. Aortic valve regurgitation is not visualized. No aortic stenosis is present. Aortic valve mean gradient measures 2.7 mmHg. Aortic valve peak gradient measures 4.8 mmHg. Aortic valve area, by VTI measures 1.82 cm. Pulmonic Valve: The pulmonic valve was grossly normal. Pulmonic valve regurgitation is trivial. No evidence of pulmonic stenosis. Aorta: The aortic root is normal in size and structure. Venous: The inferior vena cava is dilated in size with less than 50% respiratory variability, suggesting right atrial pressure of 15 mmHg. IAS/Shunts: No atrial level shunt detected by color flow Doppler.  LEFT VENTRICLE PLAX 2D LVIDd:         4.20 cm     Diastology LVIDs:         3.10 cm     LV e' medial:     6.63 cm/s LV PW:         1.10 cm     LV E/e' medial:  24.4 LV IVS:        1.10 cm     LV e' lateral:   12.70 cm/s LVOT diam:     1.90 cm     LV E/e' lateral: 12.7 LV SV:         32 LV SV Index:   24 LVOT Area:     2.84 cm  LV Volumes (MOD) LV vol d, MOD A2C: 78.1 ml LV vol d, MOD A4C: 57.8 ml LV vol s, MOD A2C: 36.5 ml LV vol s, MOD A4C: 27.6 ml LV SV MOD A2C:     41.6 ml LV SV MOD A4C:     57.8 ml LV SV MOD BP:      34.7 ml RIGHT VENTRICLE RV Basal diam:  4.40 cm RV S prime:     8.67 cm/s TAPSE (M-mode): 1.3 cm LEFT ATRIUM              Index         RIGHT ATRIUM           Index LA diam:        4.70 cm  3.54 cm/m    RA Area:     27.50 cm LA Vol (A2C):   149.0 ml 112.08 ml/m  RA Volume:  83.60 ml  62.88 ml/m LA Vol (A4C):   76.0 ml  57.17 ml/m LA Biplane Vol: 110.0 ml 82.74 ml/m  AORTIC VALVE AV Area (Vmax):    1.80 cm AV Area (Vmean):   1.70 cm AV Area (VTI):     1.82 cm AV Vmax:           109.30 cm/s AV Vmean:          75.300 cm/s AV VTI:            0.176 m AV Peak Grad:      4.8 mmHg AV Mean Grad:      2.7 mmHg LVOT Vmax:         69.23 cm/s LVOT Vmean:        45.100 cm/s LVOT VTI:          0.113 m LVOT/AV VTI ratio: 0.64  AORTA Ao Root diam: 2.90 cm MITRAL VALVE                TRICUSPID VALVE MV Area (PHT): 3.49 cm     TR Peak grad:   37.5 mmHg MV Area VTI:   1.15 cm     TR Vmax:        306.00 cm/s MV Peak grad:  11.1 mmHg MV Mean grad:  4.3 mmHg     SHUNTS MV Vmax:       1.66 m/s     Systemic VTI:  0.11 m MV Vmean:      97.1 cm/s    Systemic Diam: 1.90 cm MR Peak grad: 116.6 mmHg MR Vmax:      540.00 cm/s MV E velocity: 161.67 cm/s Rachelle Hora Croitoru MD Electronically signed by Thurmon Fair MD Signature Date/Time: 04/07/2023/2:41:25 PM    Final    VAS [place] LOWER EXTREMITY VENOUS (DVT) Result Date: 04/07/2023  Lower Venous DVT Study Patient Name:  Theresa [name]  Date of Exam:   04/07/2023 Medical Rec #: [id number]           Accession #:    [id number] Date of Birth: [birth year]           Patient  Gender: F Patient Age:   78 years Exam Location:  [facility] Procedure:      VAS [place] LOWER EXTREMITY VENOUS (DVT) Referring Phys: Lyda Perone --------------------------------------------------------------------------------  Indications: Swelling.  Risk Factors: None identified. Anticoagulation: Eliquis. Comparison Study: No prior studies. Performing Technologist: Chanda Busing RVT  Examination Guidelines: A complete evaluation includes B-mode imaging, spectral Doppler, color Doppler, and power Doppler as needed of all accessible portions of each vessel. Bilateral testing is considered an integral part of a complete examination. Limited examinations for reoccurring indications may be performed as noted. The reflux portion of the exam is performed with the patient in reverse Trendelenburg.  +---------+---------------+---------+-----------+----------+--------------+ RIGHT    CompressibilityPhasicitySpontaneityPropertiesThrombus Aging +---------+---------------+---------+-----------+----------+--------------+ CFV      Full           Yes      No                                  +---------+---------------+---------+-----------+----------+--------------+ SFJ      Full                                                        +---------+---------------+---------+-----------+----------+--------------+  FV Prox  Full                                                        +---------+---------------+---------+-----------+----------+--------------+ FV Mid   Full                                                        +---------+---------------+---------+-----------+----------+--------------+ FV DistalFull                                                        +---------+---------------+---------+-----------+----------+--------------+ PFV      Full                                                         +---------+---------------+---------+-----------+----------+--------------+ POP      Full           Yes      Yes                                 +---------+---------------+---------+-----------+----------+--------------+ PTV      Full                                                        +---------+---------------+---------+-----------+----------+--------------+ PERO     Full                                                        +---------+---------------+---------+-----------+----------+--------------+   +----+---------------+---------+-----------+----------+--------------+ LEFTCompressibilityPhasicitySpontaneityPropertiesThrombus Aging +----+---------------+---------+-----------+----------+--------------+ CFV Full           Yes      No                                  +----+---------------+---------+-----------+----------+--------------+    Summary: RIGHT: - There is no evidence of deep vein thrombosis in the lower extremity.  - No cystic structure found in the popliteal fossa.  LEFT: - No evidence of common femoral vein obstruction.   *See table(s) above for measurements and observations. Electronically signed by Lemar Livings MD on 04/07/2023 at 1:38:47 PM.    Final    CT ANGIO HEAD NECK W WO CM Result Date: 04/06/2023 CLINICAL DATA:  Follow-up examination for stroke. EXAM: CT ANGIOGRAPHY HEAD AND NECK WITH AND WITHOUT CONTRAST TECHNIQUE: Multidetector CT imaging of the head and neck was performed using the standard protocol during bolus administration of intravenous contrast. Multiplanar CT image  reconstructions and MIPs were obtained to evaluate the vascular anatomy. Carotid stenosis measurements (when applicable) are obtained utilizing NASCET criteria, using the distal internal carotid diameter as the denominator. RADIATION DOSE REDUCTION: This exam was performed according to the departmental dose-optimization program which includes automated exposure control, adjustment  of the mA and/or kV according to patient size and/or use of iterative reconstruction technique. CONTRAST:  75mL OMNIPAQUE IOHEXOL 350 MG/ML SOLN COMPARISON:  Prior brain MRI from earlier the same day. FINDINGS: CT HEAD FINDINGS Brain: Moderately advanced cerebral atrophy with mild chronic small vessel ischemic disease. Chronic left frontal lobe infarct. Few additional scattered small remote bilateral cerebellar infarcts. Previously identified subcentimeter ischemic infarcts not visible by CT. No other acute large vessel territory infarct. No acute intracranial hemorrhage. No mass lesion or midline shift. No hydrocephalus or extra-axial fluid collection. Vascular: No abnormal hyperdense vessel. Scattered vascular calcifications noted within the carotid siphons. Skull: Scalp soft tissues within normal limits.  Calvarium intact. Sinuses/Orbits: Globes and orbital soft tissues within normal limits. Paranasal sinuses and mastoid air cells are clear. Other: None. Review of the MIP images confirms the above findings CTA NECK FINDINGS Aortic arch: Visualized aortic arch within normal limits for caliber with standard branch pattern. Mild aortic atherosclerosis. No significant stenosis about the origin of the great vessels. Right carotid system: Right common and internal carotid arteries are patent without stenosis or dissection. Left carotid system: Left common and internal carotid arteries are patent without stenosis or dissection. Vertebral arteries: Both vertebral arteries arise from subclavian arteries. No proximal subclavian artery stenosis. Right vertebral artery dominant with a diffusely hypoplastic left vertebral artery. Vertebral arteries are patent without stenosis or dissection. Skeleton: Reversal of the normal cervical lordosis with advanced cervical spondylosis. Few probable benign hemangiomata noted. Other neck: No other acute finding. Upper chest: Small layering right pleural effusion. No other acute finding.  Review of the MIP images confirms the above findings CTA HEAD FINDINGS Anterior circulation: Mild atheromatous change about the carotid siphons without hemodynamically significant stenosis. A1 segments patent bilaterally. Right A1 diminutive. Normal anterior communicating artery complex. Anterior cerebral arteries patent without significant stenosis. No M1 stenosis or occlusion. Distal MCA branches perfused and symmetric. Posterior circulation: Dominant right V4 segment widely patent. Hypoplastic left vertebral artery largely terminates in PICA. Both PICA patent. Basilar patent without stenosis. Superior cerebellar and posterior cerebral arteries widely patent bilaterally. Venous sinuses: Grossly patent allowing for timing the contrast bolus. Anatomic variants: As above.  No aneurysm. Review of the MIP images confirms the above findings IMPRESSION: CT HEAD: 1. No acute intracranial abnormality. Previously identified subcentimeter ischemic infarcts not visible by CT. 2. Underlying moderately advanced cerebral atrophy with mild chronic small vessel ischemic disease, with chronic left frontal lobe infarct. Few additional scattered small remote bilateral cerebellar infarcts. CTA HEAD AND NECK: 1. Negative CTA for large vessel occlusion or other emergent finding. 2. Mild for age atheromatous disease without hemodynamically significant stenosis. 3.  Aortic Atherosclerosis (ICD10-I70.0). 4. Small layering right pleural effusion. Electronically Signed   By: Rise Mu M.D.   On: 04/06/2023 20:05   MR BRAIN WO CONTRAST Result Date: 04/06/2023 CLINICAL DATA:  Transient ischemic attack. EXAM: MRI HEAD WITHOUT CONTRAST TECHNIQUE: Multiplanar, multiecho pulse sequences of the brain and surrounding structures were obtained without intravenous contrast. COMPARISON:  MRI of the brain September 21, 2019. FINDINGS: The study was terminated prematurely due to patient inability to lie still in the scanner. Some of the images  obtained are degraded by motion. Brain:  Multiple small foci of restricted diffusion are seen in the superior left cerebellar hemisphere, left occipital lobe, bilateral frontal lobes and right parietal lobe, consistent with acute infarcts, likely embolic. No hemorrhage, hydrocephalus, extra-axial collection or mass lesion. Area of encephalomalacia and gliosis in the left frontoparietal region. Multiple small chronic infarcts in the bilateral cerebellar hemispheres. Moderate parenchymal volume loss. Vascular: Normal flow voids. Skull and upper cervical spine: Grossly unremarkable. Sinuses/Orbits: Negative. Other: None. IMPRESSION: 1. Multiple small foci of restricted diffusion in the superior left cerebellar hemisphere, left occipital lobe, bilateral frontal lobes and right parietal lobe, consistent with acute infarcts, likely embolic. 2. Area of encephalomalacia and gliosis in the left frontoparietal region. 3. Multiple small chronic infarcts in the bilateral cerebellar hemispheres. 4. Moderate parenchymal volume loss. Electronically Signed   By: Baldemar Lenis M.D.   On: 04/06/2023 15:48    Assessment/Plan 1. Acute CVA (cerebrovascular accident) (HCC) (Primary) Now on Eliquis Also on Statin Seems back to her baseline Will continue working with therapy  2. Slurred speech Resolved mostly  3. Confusion Seems back to her baseline Her Slums was 15/30 before she went to the hospital It is going to be hard for her to go back to her Apartment without some oversight She does not have any Kids  4. S/P right hip fracture Doing well On Tylenol Discontinue Robaxin to prevent Confusion  5. Atrial fibrillation, chronic (HCC) On Eliquis and Metoproll  6. Hypothyroidism due to acquired atrophy of thyroid TSH nromal 12/24 7 Depression Change Lexapro to 20 mg every day Use vistaril 10 mg every day prn  Labs/tests ordered:  * No order type specified * Next appt:  Visit date not found

## 2023-04-10 DIAGNOSIS — R269 Unspecified abnormalities of gait and mobility: Secondary | ICD-10-CM | POA: Diagnosis not present

## 2023-04-10 DIAGNOSIS — R41841 Cognitive communication deficit: Secondary | ICD-10-CM | POA: Diagnosis not present

## 2023-04-10 DIAGNOSIS — S7291XA Unspecified fracture of right femur, initial encounter for closed fracture: Secondary | ICD-10-CM | POA: Diagnosis not present

## 2023-04-10 DIAGNOSIS — Z9181 History of falling: Secondary | ICD-10-CM | POA: Diagnosis not present

## 2023-04-10 DIAGNOSIS — M6281 Muscle weakness (generalized): Secondary | ICD-10-CM | POA: Diagnosis not present

## 2023-04-10 DIAGNOSIS — Z4789 Encounter for other orthopedic aftercare: Secondary | ICD-10-CM | POA: Diagnosis not present

## 2023-04-13 DIAGNOSIS — Z9181 History of falling: Secondary | ICD-10-CM | POA: Diagnosis not present

## 2023-04-13 DIAGNOSIS — Z4789 Encounter for other orthopedic aftercare: Secondary | ICD-10-CM | POA: Diagnosis not present

## 2023-04-13 DIAGNOSIS — S7291XA Unspecified fracture of right femur, initial encounter for closed fracture: Secondary | ICD-10-CM | POA: Diagnosis not present

## 2023-04-13 DIAGNOSIS — R41841 Cognitive communication deficit: Secondary | ICD-10-CM | POA: Diagnosis not present

## 2023-04-13 DIAGNOSIS — M6281 Muscle weakness (generalized): Secondary | ICD-10-CM | POA: Diagnosis not present

## 2023-04-13 DIAGNOSIS — R269 Unspecified abnormalities of gait and mobility: Secondary | ICD-10-CM | POA: Diagnosis not present

## 2023-04-14 DIAGNOSIS — Z4789 Encounter for other orthopedic aftercare: Secondary | ICD-10-CM | POA: Diagnosis not present

## 2023-04-14 DIAGNOSIS — M6281 Muscle weakness (generalized): Secondary | ICD-10-CM | POA: Diagnosis not present

## 2023-04-14 DIAGNOSIS — R269 Unspecified abnormalities of gait and mobility: Secondary | ICD-10-CM | POA: Diagnosis not present

## 2023-04-15 DIAGNOSIS — M6281 Muscle weakness (generalized): Secondary | ICD-10-CM | POA: Diagnosis not present

## 2023-04-15 DIAGNOSIS — Z9181 History of falling: Secondary | ICD-10-CM | POA: Diagnosis not present

## 2023-04-15 DIAGNOSIS — S7291XA Unspecified fracture of right femur, initial encounter for closed fracture: Secondary | ICD-10-CM | POA: Diagnosis not present

## 2023-04-15 DIAGNOSIS — R41841 Cognitive communication deficit: Secondary | ICD-10-CM | POA: Diagnosis not present

## 2023-04-15 DIAGNOSIS — R269 Unspecified abnormalities of gait and mobility: Secondary | ICD-10-CM | POA: Diagnosis not present

## 2023-04-15 DIAGNOSIS — Z4789 Encounter for other orthopedic aftercare: Secondary | ICD-10-CM | POA: Diagnosis not present

## 2023-04-16 DIAGNOSIS — M6281 Muscle weakness (generalized): Secondary | ICD-10-CM | POA: Diagnosis not present

## 2023-04-16 DIAGNOSIS — R269 Unspecified abnormalities of gait and mobility: Secondary | ICD-10-CM | POA: Diagnosis not present

## 2023-04-16 DIAGNOSIS — Z4789 Encounter for other orthopedic aftercare: Secondary | ICD-10-CM | POA: Diagnosis not present

## 2023-04-17 DIAGNOSIS — M6281 Muscle weakness (generalized): Secondary | ICD-10-CM | POA: Diagnosis not present

## 2023-04-17 DIAGNOSIS — Z9181 History of falling: Secondary | ICD-10-CM | POA: Diagnosis not present

## 2023-04-17 DIAGNOSIS — R41841 Cognitive communication deficit: Secondary | ICD-10-CM | POA: Diagnosis not present

## 2023-04-17 DIAGNOSIS — R269 Unspecified abnormalities of gait and mobility: Secondary | ICD-10-CM | POA: Diagnosis not present

## 2023-04-17 DIAGNOSIS — S7291XA Unspecified fracture of right femur, initial encounter for closed fracture: Secondary | ICD-10-CM | POA: Diagnosis not present

## 2023-04-17 DIAGNOSIS — Z4789 Encounter for other orthopedic aftercare: Secondary | ICD-10-CM | POA: Diagnosis not present

## 2023-04-20 DIAGNOSIS — Z9181 History of falling: Secondary | ICD-10-CM | POA: Diagnosis not present

## 2023-04-20 DIAGNOSIS — R41841 Cognitive communication deficit: Secondary | ICD-10-CM | POA: Diagnosis not present

## 2023-04-20 DIAGNOSIS — S7291XA Unspecified fracture of right femur, initial encounter for closed fracture: Secondary | ICD-10-CM | POA: Diagnosis not present

## 2023-04-20 DIAGNOSIS — Z4789 Encounter for other orthopedic aftercare: Secondary | ICD-10-CM | POA: Diagnosis not present

## 2023-04-20 DIAGNOSIS — M6281 Muscle weakness (generalized): Secondary | ICD-10-CM | POA: Diagnosis not present

## 2023-04-20 DIAGNOSIS — R269 Unspecified abnormalities of gait and mobility: Secondary | ICD-10-CM | POA: Diagnosis not present

## 2023-04-21 DIAGNOSIS — Z96641 Presence of right artificial hip joint: Secondary | ICD-10-CM | POA: Diagnosis not present

## 2023-04-21 DIAGNOSIS — R269 Unspecified abnormalities of gait and mobility: Secondary | ICD-10-CM | POA: Diagnosis not present

## 2023-04-21 DIAGNOSIS — Z4789 Encounter for other orthopedic aftercare: Secondary | ICD-10-CM | POA: Diagnosis not present

## 2023-04-21 DIAGNOSIS — M6281 Muscle weakness (generalized): Secondary | ICD-10-CM | POA: Diagnosis not present

## 2023-04-22 DIAGNOSIS — Z4789 Encounter for other orthopedic aftercare: Secondary | ICD-10-CM | POA: Diagnosis not present

## 2023-04-22 DIAGNOSIS — R41841 Cognitive communication deficit: Secondary | ICD-10-CM | POA: Diagnosis not present

## 2023-04-22 DIAGNOSIS — S7291XA Unspecified fracture of right femur, initial encounter for closed fracture: Secondary | ICD-10-CM | POA: Diagnosis not present

## 2023-04-22 DIAGNOSIS — Z9181 History of falling: Secondary | ICD-10-CM | POA: Diagnosis not present

## 2023-04-22 DIAGNOSIS — R269 Unspecified abnormalities of gait and mobility: Secondary | ICD-10-CM | POA: Diagnosis not present

## 2023-04-22 DIAGNOSIS — M6281 Muscle weakness (generalized): Secondary | ICD-10-CM | POA: Diagnosis not present

## 2023-04-23 DIAGNOSIS — Z4789 Encounter for other orthopedic aftercare: Secondary | ICD-10-CM | POA: Diagnosis not present

## 2023-04-23 DIAGNOSIS — R269 Unspecified abnormalities of gait and mobility: Secondary | ICD-10-CM | POA: Diagnosis not present

## 2023-04-23 DIAGNOSIS — M6281 Muscle weakness (generalized): Secondary | ICD-10-CM | POA: Diagnosis not present

## 2023-04-24 DIAGNOSIS — H34831 Tributary (branch) retinal vein occlusion, right eye, with macular edema: Secondary | ICD-10-CM | POA: Diagnosis not present

## 2023-04-24 DIAGNOSIS — R269 Unspecified abnormalities of gait and mobility: Secondary | ICD-10-CM | POA: Diagnosis not present

## 2023-04-24 DIAGNOSIS — M6281 Muscle weakness (generalized): Secondary | ICD-10-CM | POA: Diagnosis not present

## 2023-04-24 DIAGNOSIS — Z4789 Encounter for other orthopedic aftercare: Secondary | ICD-10-CM | POA: Diagnosis not present

## 2023-04-24 DIAGNOSIS — Z9181 History of falling: Secondary | ICD-10-CM | POA: Diagnosis not present

## 2023-04-24 DIAGNOSIS — S7291XA Unspecified fracture of right femur, initial encounter for closed fracture: Secondary | ICD-10-CM | POA: Diagnosis not present

## 2023-04-24 DIAGNOSIS — R41841 Cognitive communication deficit: Secondary | ICD-10-CM | POA: Diagnosis not present

## 2023-04-27 DIAGNOSIS — R269 Unspecified abnormalities of gait and mobility: Secondary | ICD-10-CM | POA: Diagnosis not present

## 2023-04-27 DIAGNOSIS — M6281 Muscle weakness (generalized): Secondary | ICD-10-CM | POA: Diagnosis not present

## 2023-04-27 DIAGNOSIS — Z4789 Encounter for other orthopedic aftercare: Secondary | ICD-10-CM | POA: Diagnosis not present

## 2023-04-27 DIAGNOSIS — Z9181 History of falling: Secondary | ICD-10-CM | POA: Diagnosis not present

## 2023-04-28 DIAGNOSIS — Z4789 Encounter for other orthopedic aftercare: Secondary | ICD-10-CM | POA: Diagnosis not present

## 2023-04-28 DIAGNOSIS — S7291XA Unspecified fracture of right femur, initial encounter for closed fracture: Secondary | ICD-10-CM | POA: Diagnosis not present

## 2023-04-28 DIAGNOSIS — M6281 Muscle weakness (generalized): Secondary | ICD-10-CM | POA: Diagnosis not present

## 2023-04-28 DIAGNOSIS — R269 Unspecified abnormalities of gait and mobility: Secondary | ICD-10-CM | POA: Diagnosis not present

## 2023-04-28 DIAGNOSIS — R41841 Cognitive communication deficit: Secondary | ICD-10-CM | POA: Diagnosis not present

## 2023-04-29 DIAGNOSIS — R41841 Cognitive communication deficit: Secondary | ICD-10-CM | POA: Diagnosis not present

## 2023-04-29 DIAGNOSIS — S7291XA Unspecified fracture of right femur, initial encounter for closed fracture: Secondary | ICD-10-CM | POA: Diagnosis not present

## 2023-04-29 DIAGNOSIS — M6281 Muscle weakness (generalized): Secondary | ICD-10-CM | POA: Diagnosis not present

## 2023-04-29 DIAGNOSIS — Z9181 History of falling: Secondary | ICD-10-CM | POA: Diagnosis not present

## 2023-04-29 DIAGNOSIS — R269 Unspecified abnormalities of gait and mobility: Secondary | ICD-10-CM | POA: Diagnosis not present

## 2023-04-29 DIAGNOSIS — Z4789 Encounter for other orthopedic aftercare: Secondary | ICD-10-CM | POA: Diagnosis not present

## 2023-04-30 DIAGNOSIS — Z4789 Encounter for other orthopedic aftercare: Secondary | ICD-10-CM | POA: Diagnosis not present

## 2023-04-30 DIAGNOSIS — M6281 Muscle weakness (generalized): Secondary | ICD-10-CM | POA: Diagnosis not present

## 2023-04-30 DIAGNOSIS — R41841 Cognitive communication deficit: Secondary | ICD-10-CM | POA: Diagnosis not present

## 2023-04-30 DIAGNOSIS — R269 Unspecified abnormalities of gait and mobility: Secondary | ICD-10-CM | POA: Diagnosis not present

## 2023-04-30 DIAGNOSIS — S7291XA Unspecified fracture of right femur, initial encounter for closed fracture: Secondary | ICD-10-CM | POA: Diagnosis not present

## 2023-05-01 DIAGNOSIS — Z4789 Encounter for other orthopedic aftercare: Secondary | ICD-10-CM | POA: Diagnosis not present

## 2023-05-01 DIAGNOSIS — R269 Unspecified abnormalities of gait and mobility: Secondary | ICD-10-CM | POA: Diagnosis not present

## 2023-05-01 DIAGNOSIS — Z9181 History of falling: Secondary | ICD-10-CM | POA: Diagnosis not present

## 2023-05-01 DIAGNOSIS — M6281 Muscle weakness (generalized): Secondary | ICD-10-CM | POA: Diagnosis not present

## 2023-05-04 DIAGNOSIS — M6281 Muscle weakness (generalized): Secondary | ICD-10-CM | POA: Diagnosis not present

## 2023-05-04 DIAGNOSIS — Z4789 Encounter for other orthopedic aftercare: Secondary | ICD-10-CM | POA: Diagnosis not present

## 2023-05-04 DIAGNOSIS — R269 Unspecified abnormalities of gait and mobility: Secondary | ICD-10-CM | POA: Diagnosis not present

## 2023-05-05 ENCOUNTER — Non-Acute Institutional Stay (SKILLED_NURSING_FACILITY): Payer: Self-pay | Admitting: Adult Health

## 2023-05-05 ENCOUNTER — Encounter: Payer: Self-pay | Admitting: Adult Health

## 2023-05-05 DIAGNOSIS — Z8673 Personal history of transient ischemic attack (TIA), and cerebral infarction without residual deficits: Secondary | ICD-10-CM

## 2023-05-05 DIAGNOSIS — I480 Paroxysmal atrial fibrillation: Secondary | ICD-10-CM

## 2023-05-05 DIAGNOSIS — E034 Atrophy of thyroid (acquired): Secondary | ICD-10-CM

## 2023-05-05 DIAGNOSIS — Z4789 Encounter for other orthopedic aftercare: Secondary | ICD-10-CM | POA: Diagnosis not present

## 2023-05-05 DIAGNOSIS — Z9889 Other specified postprocedural states: Secondary | ICD-10-CM

## 2023-05-05 DIAGNOSIS — S7291XA Unspecified fracture of right femur, initial encounter for closed fracture: Secondary | ICD-10-CM | POA: Diagnosis not present

## 2023-05-05 DIAGNOSIS — Z9181 History of falling: Secondary | ICD-10-CM | POA: Diagnosis not present

## 2023-05-05 DIAGNOSIS — F3342 Major depressive disorder, recurrent, in full remission: Secondary | ICD-10-CM | POA: Diagnosis not present

## 2023-05-05 DIAGNOSIS — R41841 Cognitive communication deficit: Secondary | ICD-10-CM | POA: Diagnosis not present

## 2023-05-05 DIAGNOSIS — M6281 Muscle weakness (generalized): Secondary | ICD-10-CM | POA: Diagnosis not present

## 2023-05-05 DIAGNOSIS — R269 Unspecified abnormalities of gait and mobility: Secondary | ICD-10-CM | POA: Diagnosis not present

## 2023-05-05 MED ORDER — AMOXICILLIN 500 MG PO CAPS: 2000.0000 mg | ORAL_CAPSULE | ORAL | 0 refills | Status: DC | PRN

## 2023-05-05 NOTE — Progress Notes (Signed)
Location:  [facility] Room Number: 9 A Place of Service:  SNF (31) Provider:  Kenard Gower, DNP, FNP-BC  Patient Care Team: Shelva Majestic, MD as PCP - General (Family Medicine) Wendall Stade, MD as PCP - Cardiology (Cardiology) Mckinley Jewel, MD as Consulting Physician (Ophthalmology) Mitzi Davenport, Minerva Fester, MD as Referring Physician (Ophthalmology) Huston Foley, MD as Consulting Physician (Neurology) Erroll Luna, [facility] (Inactive) as Pharmacist (Pharmacist)  Extended Emergency Contact Information Primary Emergency Contact: McGinnis,Eileen Mobile Phone: [phone] Relation: Other Secondary Emergency Contact: Heyge,Lorna Mobile Phone: [phone] Relation: Friend Interpreter needed? No  Code Status:  Full Code  Goals of care: Advanced Directive information    04/06/2023    2:20 PM  Advanced Directives  Does Patient Have a Medical Advance Directive? Yes  Type of Advance Directive Healthcare Power of Attorney  Does patient want to make changes to medical advance directive? No - Patient declined  Copy of Healthcare Power of Attorney in Chart? No - copy requested, Physician notified     Chief Complaint  Patient presents with   Acute Visit    Needing antibiotic prophylaxis prior to dental procedure    HPI:  Pt is a 89 y.o. female seen today for an acute visit for antibiotic prophylaxis  prior to dental procedure.She is a resident of [id number]. She was recently hospitalized 04/06/23 to 04/08/23 for acute embolic stroke. She is currently having short-term rehabilitation. She currently takes Eliquis 2.5 mg BID. She has PAF and currently takes Metoprolol tartrate 25 mg BID for PAF.   Patient was seen in the room today with husband at bedside. She had mitral valve repair in 2008 at [facility], therefore she is needing antibiotic prophylaxis prior to dental procedure. Patient is aware of this and has been on antibiotic  prophylaxis ever since MVP repair in 2008. She is for routine dental cleaning on May 19, 2023.  She currently takes Lexapro 20 mg daily for depression. She stated that she feels slightly depressed but due to her current situation, awaiting room availability with her husband who is at ALF. She has no thoughts of hurting herself.  She takes Levothyroxine 75 mcg daily for hypothyroidism. Latest TSH 1.862, 04/08/23.   Past Medical History:  Diagnosis Date   Atrial fibrillation (HCC)    AFIB   Collagenous colitis    CVA (cerebral vascular accident) (HCC)    a.  L parietal CVA by MRI 12/12, likely embolic;  b.  TEE by report with neg bubble study;  c.  carotid dopplers  12/12:  + plaque, no sig ICA stenosis    History of postmenopausal HRT    Hx of adenomatous colonic polyps    Hypothyroidism    MVP (mitral valve prolapse)    a. s/p MV repair at [facility] in 2008;  b. Echocardiogram 7/12: EF 50%, status post mitral valve repair with mild MR and minimal MS, mean gradient 4, moderate LAE, LA diam 55 mm; mild RAE, PASP 28-32;    Osteoarthritis    Osteoporosis    Past Surgical History:  Procedure Laterality Date   ANTERIOR APPROACH HEMI HIP ARTHROPLASTY Right 03/05/2023   Procedure: ANTERIOR APPROACH HEMI HIP ARTHROPLASTY;  Surgeon: Samson Frederic, MD;  Location: [facility] ORS;  Service: Orthopedics;  Laterality: Right;   CARDIOVERSION  01/09/2012   Procedure: CARDIOVERSION;  Surgeon: Dolores Patty, MD;  Location: [facility] ENDOSCOPY;  Service: Cardiovascular;  Laterality: N/A;   CATARACT EXTRACTION  CHOLECYSTECTOMY     DILATION AND CURETTAGE OF UTERUS     LUMBAR FUSION     MITRAL VALVE REPAIR  03/17/2006   "mitral valve repair CE ring and maze procedure"   TONSILLECTOMY      Allergies  Allergen Reactions   Tape Other (See Comments)    SKIN TEARS AND BRUISES VERY EASILY   Rofecoxib Other (See Comments)    Vioxx- Elevated LFT's  Other Reaction(s): Not available    Outpatient Encounter  Medications as of 05/05/2023  Medication Sig   apixaban (ELIQUIS) 2.5 MG TABS tablet Take 1 tablet (2.5 mg total) by mouth 2 (two) times daily.   Calcium Citrate-Vitamin D (CITRACAL PETITES/VITAMIN D PO) Take 1 tablet by mouth 2 (two) times daily.   docusate sodium (COLACE) 100 MG capsule Take 1 capsule (100 mg total) by mouth 2 (two) times daily.   escitalopram (LEXAPRO) 20 MG tablet Take 20 mg by mouth daily.   fluticasone (FLONASE) 50 MCG/ACT nasal spray Place 2 sprays into both nostrils daily.   glucosamine-chondroitin 500-400 MG tablet Take 1 tablet by mouth 2 (two) times daily.   levothyroxine (SYNTHROID) 75 MCG tablet TAKE 1 TABLET BY MOUTH EVERY DAY BEFORE BREAKFAST   loratadine (CLARITIN) 10 MG tablet Take 1 tablet (10 mg total) by mouth daily.   metoprolol tartrate (LOPRESSOR) 25 MG tablet Take 1 tablet (25 mg total) by mouth 2 (two) times daily.   Multiple Vitamins-Minerals (PRESERVISION AREDS 2 PO) Take 1 capsule by mouth 2 (two) times daily. Reported on 09/04/2015   polyethylene glycol (MIRALAX / GLYCOLAX) 17 g packet Take 17 g by mouth daily as needed for mild constipation.   rosuvastatin (CRESTOR) 10 MG tablet TAKE 1 TABLET BY MOUTH DAILY. GENERIC EQUIVALENT FOR CRESTOR   Saline (SIMPLY SALINE) 0.9 % AERS Place 2 each into the nose daily as needed.   TYLENOL 8 HOUR ARTHRITIS PAIN 650 MG CR tablet Take 650 mg by mouth in the morning.   vitamin A 16109 UNIT capsule Take 1 capsule by mouth daily.   zinc oxide 20 % ointment Apply 1 Application topically as needed for irritation.   No facility-administered encounter medications on file as of 05/05/2023.    Review of Systems  Constitutional:  Negative for appetite change, chills, fatigue and fever.  HENT:  Negative for congestion, hearing loss, rhinorrhea and sore throat.   Eyes: Negative.   Respiratory:  Negative for cough, shortness of breath and wheezing.   Cardiovascular:  Negative for chest pain, palpitations and leg swelling.   Gastrointestinal:  Negative for abdominal pain, constipation, diarrhea, nausea and vomiting.  Genitourinary:  Negative for dysuria.  Musculoskeletal:  Negative for arthralgias, back pain and myalgias.  Skin:  Negative for color change, rash and wound.  Neurological:  Negative for dizziness, weakness and headaches.  Psychiatric/Behavioral:  Negative for behavioral problems. The patient is not nervous/anxious.      Immunization History  Administered Date(s) Administered   Fluad Quad(high Dose 65+) 11/30/2018, 01/17/2020   Fluad Trivalent(High Dose 65+) 03/02/2023   Influenza Split 12/17/2010, 12/02/2011   Influenza Whole 03/17/2001, 12/18/2006, 12/28/2007, 01/03/2009, 12/26/2009   Influenza, High Dose Seasonal PF 12/22/2012, 03/06/2015, 11/26/2015, 12/09/2017   Influenza,inj,Quad PF,6+ Mos 11/23/2013   Influenza-Unspecified 11/20/2016   PFIZER(Purple Top)SARS-COV-2 Vaccination 06/09/2019, 06/30/2019, 02/16/2020   Pneumococcal Conjugate-13 03/02/2014   Pneumococcal Polysaccharide-23 03/17/2000, 09/16/2006   Td 03/18/1995, 09/15/2006, 02/22/2021   Tdap 12/17/2010   Unspecified SARS-COV-2 Vaccination 12/08/2020   Zoster Recombinant(Shingrix) 09/18/2016, 11/20/2016  Pertinent  Health Maintenance Due  Topic Date Due   INFLUENZA VACCINE  Completed   DEXA SCAN  Completed      12/12/2020    3:12 PM 11/25/2021   10:46 AM 02/11/2022   11:26 AM 05/26/2022   11:09 AM 03/02/2023    3:17 PM  Fall Risk  Falls in the past year? 0 0 1 1 1   Was there an injury with Fall? 0 0 1 1 0  Was there an injury with Fall? - Comments   fractured rib    Fall Risk Category Calculator 0 0 3 3 2   Fall Risk Category (Retired) Low Low High    (RETIRED) Patient Fall Risk Level Low fall risk Low fall risk Low fall risk    Patient at Risk for Falls Due to No Fall Risks No Fall Risks No Fall Risks History of fall(s) Impaired balance/gait;Impaired mobility;History of fall(s)  Fall risk Follow up  Falls evaluation  completed Falls evaluation completed Falls evaluation completed Falls prevention discussed     Vitals:   05/05/23 1404  BP: 126/73  Pulse: 84  Resp: 17  Temp: (!) 97.3 F (36.3 C)  SpO2: 98%  Weight: 96 lb 1.6 oz (43.6 kg)  Height: 5' (1.524 m)   Body mass index is 18.77 kg/m.  Physical Exam Constitutional:      Appearance: Normal appearance.  HENT:     Head: Normocephalic and atraumatic.     Nose: Nose normal.     Mouth/Throat:     Mouth: Mucous membranes are moist.  Eyes:     Conjunctiva/sclera: Conjunctivae normal.  Cardiovascular:     Rate and Rhythm: Normal rate. Rhythm irregular.  Pulmonary:     Effort: Pulmonary effort is normal.     Breath sounds: Normal breath sounds.  Abdominal:     General: Bowel sounds are normal.     Palpations: Abdomen is soft.  Musculoskeletal:        General: Normal range of motion.     Cervical back: Normal range of motion.  Skin:    General: Skin is warm and dry.  Neurological:     General: No focal deficit present.     Mental Status: She is alert and oriented to person, place, and time.  Psychiatric:        Mood and Affect: Mood normal.        Behavior: Behavior normal.        Thought Content: Thought content normal.        Judgment: Judgment normal.      Labs reviewed: Recent Labs    03/06/23 0337 03/07/23 0352 03/08/23 0352 04/06/23 1326 04/08/23 0438 04/08/23 0439  NA 130* 133* 139 137 140  --   K 4.0 4.6 4.4 5.0 3.6  --   CL 97* 98 103 101 105  --   CO2 26 29 30 26 23   --   GLUCOSE 154* 131* 95 98 89  --   BUN 25* 34* 27* 25* 24*  --   CREATININE 0.93 0.89 0.64 0.94 0.89  --   CALCIUM 7.7* 8.5* 8.6* 9.3 8.6*  --   MG  --  2.1 2.2  --   --  2.0  PHOS 3.1 1.6* 3.1  --   --   --    Recent Labs    05/26/22 1200 03/03/23 2200 03/05/23 0748 03/07/23 0352 03/08/23 0352 04/06/23 1326  AST 28 29  --   --   --  33  ALT 36* 42  --   --   --  28  ALKPHOS 73 81  --   --   --  159*  BILITOT 0.4 0.7  --   --    --  0.7  PROT 6.3 5.9*  --   --   --  6.2*  ALBUMIN 4.2 3.6   < > 2.8* 2.8* 3.6   < > = values in this interval not displayed.   Recent Labs    03/03/23 2124 03/05/23 0748 03/07/23 0352 03/08/23 0352 04/06/23 1326  WBC 10.9*   < > 13.3* 9.5 10.5  NEUTROABS 9.0*  --   --   --  8.0*  HGB 13.4   < > 11.7* 11.4* 11.9*  HCT 41.0   < > 36.3 34.7* 37.9  MCV 102.2*   < > 100.6* 99.7 101.6*  PLT 150   < > 173 167 191   < > = values in this interval not displayed.   Lab Results  Component Value Date   TSH 1.862 04/08/2023   Lab Results  Component Value Date   HGBA1C 5.5 03/06/2023   Lab Results  Component Value Date   CHOL 132 04/07/2023   HDL 66 04/07/2023   LDLCALC 57 04/07/2023   LDLDIRECT 47.0 09/04/2015   TRIG 43 04/07/2023   CHOLHDL 2.0 04/07/2023    Significant Diagnostic Results in last 30 days:  ECHOCARDIOGRAM COMPLETE Result Date: 04/07/2023    ECHOCARDIOGRAM REPORT   Patient Name:   GENNIE EISINGER Date of Exam: 04/07/2023 Medical Rec #:  [id number]          Height:       60.0 in Accession #:    [id number]         Weight:       90.0 lb Date of Birth:  [birth year]          BSA:          1.329 m Patient Age:    89 years           BP:           136/89 mmHg Patient Gender: F                  HR:           105 bpm. Exam Location:  Inpatient Procedure: 2D Echo, Cardiac Doppler and Color Doppler Indications:    Stroke  History:        Patient has prior history of Echocardiogram examinations, most                 recent 03/22/2013. Arrythmias:Atrial Fibrillation.                  Mitral Valve: prosthetic annuloplasty ring valve is present in                 the mitral position.  Sonographer:    Karma Ganja Referring Phys: [id number] RAVI PAHWANI IMPRESSIONS  1. Left ventricular ejection fraction, by estimation, is 55 to 60%. The left ventricle has normal function. The left ventricle has no regional wall motion abnormalities. Left ventricular diastolic function could not be evaluated.  2.  Right ventricular systolic function is mildly reduced. The right ventricular size is mildly enlarged. There is moderately elevated pulmonary artery systolic pressure. The estimated right ventricular systolic pressure is 52.5 mmHg.  3. Left atrial size was severely dilated.  4. Right atrial size was severely  dilated.  5. The mitral valve has been repaired/replaced. Mild to moderate mitral valve regurgitation. The mean mitral valve gradient is 4.3 mmHg with average heart rate of 90 bpm. There is a prosthetic annuloplasty ring present in the mitral position.  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 Tricuspid valve regurgitation is mild to moderate.  7. The aortic valve is tricuspid. Aortic valve regurgitation is not visualized. No aortic stenosis is present.  8. The inferior vena cava is dilated in size with <50% respiratory variability, suggesting right atrial pressure of 15 mmHg. Comparison(s): Prior images unable to be directly viewed, comparison made by report only. Mitral gradients are higher (previously 2 mm Hg), but this may be due to faster heart rate. Pulmonary artery pressure and right atrial pressure are now high. FINDINGS  Left Ventricle: Left ventricular ejection fraction, by estimation, is 55 to 60%. The left ventricle has normal function. The left ventricle has no regional wall motion abnormalities. The left ventricular internal cavity size was normal in size. There is  no left ventricular hypertrophy. Abnormal (paradoxical) septal motion consistent with post-operative status. Left ventricular diastolic function could not be evaluated due to mitral valve repair. Left ventricular diastolic function could not be evaluated. Right Ventricle: The right ventricular size is mildly enlarged. No increase in right ventricular wall thickness. Right ventricular systolic function is mildly reduced. There is moderately elevated pulmonary artery systolic pressure. The tricuspid regurgitant velocity is 3.06 m/s, and with  an assumed right atrial pressure of 15 mmHg, the estimated right ventricular systolic pressure is 52.5 mmHg. Left Atrium: Left atrial size was severely dilated. Right Atrium: Right atrial size was severely dilated. Pericardium: There is no evidence of pericardial effusion. Mitral Valve: The mitral valve has been repaired/replaced. Mild to moderate mitral valve regurgitation, with centrally-directed jet. There is a prosthetic annuloplasty ring present in the mitral position. MV peak gradient, 11.1 mmHg. The mean mitral valve gradient is 4.3 mmHg with average heart rate of 90 bpm. Tricuspid Valve: The tricuspid valve is myxomatous. Tricuspid valve regurgitation is mild to moderate. There is mild prolapse of the tricuspid. Aortic Valve: The aortic valve is tricuspid. Aortic valve regurgitation is not visualized. No aortic stenosis is present. Aortic valve mean gradient measures 2.7 mmHg. Aortic valve peak gradient measures 4.8 mmHg. Aortic valve area, by VTI measures 1.82 cm. Pulmonic Valve: The pulmonic valve was grossly normal. Pulmonic valve regurgitation is trivial. No evidence of pulmonic stenosis. Aorta: The aortic root is normal in size and structure. Venous: The inferior vena cava is dilated in size with less than 50% respiratory variability, suggesting right atrial pressure of 15 mmHg. IAS/Shunts: No atrial level shunt detected by color flow Doppler.  LEFT VENTRICLE PLAX 2D LVIDd:         4.20 cm     Diastology LVIDs:         3.10 cm     LV e' medial:    6.63 cm/s LV PW:         1.10 cm     LV E/e' medial:  24.4 LV IVS:        1.10 cm     LV e' lateral:   12.70 cm/s LVOT diam:     1.90 cm     LV E/e' lateral: 12.7 LV SV:         32 LV SV Index:   24 LVOT Area:     2.84 cm  LV Volumes (MOD) LV vol d, MOD A2C: 78.1 ml LV  vol d, MOD A4C: 57.8 ml LV vol s, MOD A2C: 36.5 ml LV vol s, MOD A4C: 27.6 ml LV SV MOD A2C:     41.6 ml LV SV MOD A4C:     57.8 ml LV SV MOD BP:      34.7 ml RIGHT VENTRICLE RV Basal diam:   4.40 cm RV S prime:     8.67 cm/s TAPSE (M-mode): 1.3 cm LEFT ATRIUM              Index         RIGHT ATRIUM           Index LA diam:        4.70 cm  3.54 cm/m    RA Area:     27.50 cm LA Vol (A2C):   149.0 ml 112.08 ml/m  RA Volume:   83.60 ml  62.88 ml/m LA Vol (A4C):   76.0 ml  57.17 ml/m LA Biplane Vol: 110.0 ml 82.74 ml/m  AORTIC VALVE AV Area (Vmax):    1.80 cm AV Area (Vmean):   1.70 cm AV Area (VTI):     1.82 cm AV Vmax:           109.30 cm/s AV Vmean:          75.300 cm/s AV VTI:            0.176 m AV Peak Grad:      4.8 mmHg AV Mean Grad:      2.7 mmHg LVOT Vmax:         69.23 cm/s LVOT Vmean:        45.100 cm/s LVOT VTI:          0.113 m LVOT/AV VTI ratio: 0.64  AORTA Ao Root diam: 2.90 cm MITRAL VALVE                TRICUSPID VALVE MV Area (PHT): 3.49 cm     TR Peak grad:   37.5 mmHg MV Area VTI:   1.15 cm     TR Vmax:        306.00 cm/s MV Peak grad:  11.1 mmHg MV Mean grad:  4.3 mmHg     SHUNTS MV Vmax:       1.66 m/s     Systemic VTI:  0.11 m MV Vmean:      97.1 cm/s    Systemic Diam: 1.90 cm MR Peak grad: 116.6 mmHg MR Vmax:      540.00 cm/s MV E velocity: 161.67 cm/s Rachelle Hora Croitoru MD Electronically signed by Thurmon Fair MD Signature Date/Time: 04/07/2023/2:41:25 PM    Final    VAS [place] LOWER EXTREMITY VENOUS (DVT) Result Date: 04/07/2023  Lower Venous DVT Study Patient Name:  MAYURI STAPLES  Date of Exam:   04/07/2023 Medical Rec #: [id number]           Accession #:    [id number] Date of Birth: 1931-03-25           Patient Gender: F Patient Age:   71 years Exam [facility]  [facility] Procedure:      VAS [place] LOWER EXTREMITY VENOUS (DVT) Referring Phys: Lyda Perone --------------------------------------------------------------------------------  Indications: Swelling.  Risk Factors: None identified. Anticoagulation: Eliquis. Comparison Study: No prior studies. Performing Technologist: Chanda Busing RVT  Examination Guidelines: A complete evaluation includes B-mode imaging,  spectral Doppler, color Doppler, and power Doppler as needed of all accessible portions of each vessel. Bilateral testing is considered an integral  part of a complete examination. Limited examinations for reoccurring indications may be performed as noted. The reflux portion of the exam is performed with the patient in reverse Trendelenburg.  +---------+---------------+---------+-----------+----------+--------------+ RIGHT    CompressibilityPhasicitySpontaneityPropertiesThrombus Aging +---------+---------------+---------+-----------+----------+--------------+ CFV      Full           Yes      No                                  +---------+---------------+---------+-----------+----------+--------------+ SFJ      Full                                                        +---------+---------------+---------+-----------+----------+--------------+ FV Prox  Full                                                        +---------+---------------+---------+-----------+----------+--------------+ FV Mid   Full                                                        +---------+---------------+---------+-----------+----------+--------------+ FV DistalFull                                                        +---------+---------------+---------+-----------+----------+--------------+ PFV      Full                                                        +---------+---------------+---------+-----------+----------+--------------+ POP      Full           Yes      Yes                                 +---------+---------------+---------+-----------+----------+--------------+ PTV      Full                                                        +---------+---------------+---------+-----------+----------+--------------+ PERO     Full                                                        +---------+---------------+---------+-----------+----------+--------------+    +----+---------------+---------+-----------+----------+--------------+ LEFTCompressibilityPhasicitySpontaneityPropertiesThrombus Aging +----+---------------+---------+-----------+----------+--------------+ CFV Full  Yes      No                                  +----+---------------+---------+-----------+----------+--------------+    Summary: RIGHT: - There is no evidence of deep vein thrombosis in the lower extremity.  - No cystic structure found in the popliteal fossa.  LEFT: - No evidence of common femoral vein obstruction.   *See table(s) above for measurements and observations. Electronically signed by Lemar Livings MD on 04/07/2023 at 1:38:47 PM.    Final    CT ANGIO HEAD NECK W WO CM Result Date: 04/06/2023 CLINICAL DATA:  Follow-up examination for stroke. EXAM: CT ANGIOGRAPHY HEAD AND NECK WITH AND WITHOUT CONTRAST TECHNIQUE: Multidetector CT imaging of the head and neck was performed using the standard protocol during bolus administration of intravenous contrast. Multiplanar CT image reconstructions and MIPs were obtained to evaluate the vascular anatomy. Carotid stenosis measurements (when applicable) are obtained utilizing NASCET criteria, using the distal internal carotid diameter as the denominator. RADIATION DOSE REDUCTION: This exam was performed according to the departmental dose-optimization program which includes automated exposure control, adjustment of the mA and/or kV according to patient size and/or use of iterative reconstruction technique. CONTRAST:  75mL OMNIPAQUE IOHEXOL 350 MG/ML SOLN COMPARISON:  Prior brain MRI from earlier the same day. FINDINGS: CT HEAD FINDINGS Brain: Moderately advanced cerebral atrophy with mild chronic small vessel ischemic disease. Chronic left frontal lobe infarct. Few additional scattered small remote bilateral cerebellar infarcts. Previously identified subcentimeter ischemic infarcts not visible by CT. No other acute large vessel  territory infarct. No acute intracranial hemorrhage. No mass lesion or midline shift. No hydrocephalus or extra-axial fluid collection. Vascular: No abnormal hyperdense vessel. Scattered vascular calcifications noted within the carotid siphons. Skull: Scalp soft tissues within normal limits.  Calvarium intact. Sinuses/Orbits: Globes and orbital soft tissues within normal limits. Paranasal sinuses and mastoid air cells are clear. Other: None. Review of the MIP images confirms the above findings CTA NECK FINDINGS Aortic arch: Visualized aortic arch within normal limits for caliber with standard branch pattern. Mild aortic atherosclerosis. No significant stenosis about the origin of the great vessels. Right carotid system: Right common and internal carotid arteries are patent without stenosis or dissection. Left carotid system: Left common and internal carotid arteries are patent without stenosis or dissection. Vertebral arteries: Both vertebral arteries arise from subclavian arteries. No proximal subclavian artery stenosis. Right vertebral artery dominant with a diffusely hypoplastic left vertebral artery. Vertebral arteries are patent without stenosis or dissection. Skeleton: Reversal of the normal cervical lordosis with advanced cervical spondylosis. Few probable benign hemangiomata noted. Other neck: No other acute finding. Upper chest: Small layering right pleural effusion. No other acute finding. Review of the MIP images confirms the above findings CTA HEAD FINDINGS Anterior circulation: Mild atheromatous change about the carotid siphons without hemodynamically significant stenosis. A1 segments patent bilaterally. Right A1 diminutive. Normal anterior communicating artery complex. Anterior cerebral arteries patent without significant stenosis. No M1 stenosis or occlusion. Distal MCA branches perfused and symmetric. Posterior circulation: Dominant right V4 segment widely patent. Hypoplastic left vertebral artery  largely terminates in PICA. Both PICA patent. Basilar patent without stenosis. Superior cerebellar and posterior cerebral arteries widely patent bilaterally. Venous sinuses: Grossly patent allowing for timing the contrast bolus. Anatomic variants: As above.  No aneurysm. Review of the MIP images confirms the above findings IMPRESSION: CT HEAD: 1. No acute intracranial abnormality. Previously  identified subcentimeter ischemic infarcts not visible by CT. 2. Underlying moderately advanced cerebral atrophy with mild chronic small vessel ischemic disease, with chronic left frontal lobe infarct. Few additional scattered small remote bilateral cerebellar infarcts. CTA HEAD AND NECK: 1. Negative CTA for large vessel occlusion or other emergent finding. 2. Mild for age atheromatous disease without hemodynamically significant stenosis. 3.  Aortic Atherosclerosis (ICD10-I70.0). 4. Small layering right pleural effusion. Electronically Signed   By: Rise Mu M.D.   On: 04/06/2023 20:05   MR BRAIN WO CONTRAST Result Date: 04/06/2023 CLINICAL DATA:  Transient ischemic attack. EXAM: MRI HEAD WITHOUT CONTRAST TECHNIQUE: Multiplanar, multiecho pulse sequences of the brain and surrounding structures were obtained without intravenous contrast. COMPARISON:  MRI of the brain September 21, 2019. FINDINGS: The study was terminated prematurely due to patient inability to lie still in the scanner. Some of the images obtained are degraded by motion. Brain: Multiple small foci of restricted diffusion are seen in the superior left cerebellar hemisphere, left occipital lobe, bilateral frontal lobes and right parietal lobe, consistent with acute infarcts, likely embolic. No hemorrhage, hydrocephalus, extra-axial collection or mass lesion. Area of encephalomalacia and gliosis in the left frontoparietal region. Multiple small chronic infarcts in the bilateral cerebellar hemispheres. Moderate parenchymal volume loss. Vascular: Normal flow  voids. Skull and upper cervical spine: Grossly unremarkable. Sinuses/Orbits: Negative. Other: None. IMPRESSION: 1. Multiple small foci of restricted diffusion in the superior left cerebellar hemisphere, left occipital lobe, bilateral frontal lobes and right parietal lobe, consistent with acute infarcts, likely embolic. 2. Area of encephalomalacia and gliosis in the left frontoparietal region. 3. Multiple small chronic infarcts in the bilateral cerebellar hemispheres. 4. Moderate parenchymal volume loss. Electronically Signed   By: Baldemar Lenis M.D.   On: 04/06/2023 15:48    Assessment/Plan  1. History of mitral valve repair (Primary) -  for dental cleaning on May 19, 2023, needing antibiotic prophylaxis -  has been taking antibiotics S/P mitral valve repair on 2028 - amoxicillin (AMOXIL) 500 MG capsule; Take 4 capsules (2,000 mg total) by mouth as needed (1 hour before dental procedure, has history of MVR).  Dispense: 4 capsule; Refill: 0  2. Paroxysmal atrial fibrillation with RVR (HCC) -  rate-controlled -  continue Eliquis for anticoagulation and Metoprolol tartrate for rate control  3. Recurrent major depressive disorder, in full remission (HCC) -  mood is stable -  continue Lexapro  4. Hypothyroidism due to acquired atrophy of thyroid Lab Results  Component Value Date   TSH 1.862 04/08/2023    -  continue Levothyroxine  5. History of ischemic left MCA stroke -  continue Eliquis and Rosuvastatin -  continue PT and OT, for therapeutic strengthening exercises    Family/ staff Communication: Discussed plan of care with resident, husband and charge nurse.  Labs/tests ordered:  None    Kenard Gower, DNP, MSN, FNP-BC [facility] [phone] (Monday-Friday 8:00 a.m. - 5:00 p.m.) [phone] (after hours)

## 2023-05-06 DIAGNOSIS — S7291XA Unspecified fracture of right femur, initial encounter for closed fracture: Secondary | ICD-10-CM | POA: Diagnosis not present

## 2023-05-06 DIAGNOSIS — M6281 Muscle weakness (generalized): Secondary | ICD-10-CM | POA: Diagnosis not present

## 2023-05-06 DIAGNOSIS — R41841 Cognitive communication deficit: Secondary | ICD-10-CM | POA: Diagnosis not present

## 2023-05-06 DIAGNOSIS — Z4789 Encounter for other orthopedic aftercare: Secondary | ICD-10-CM | POA: Diagnosis not present

## 2023-05-06 DIAGNOSIS — Z9181 History of falling: Secondary | ICD-10-CM | POA: Diagnosis not present

## 2023-05-06 DIAGNOSIS — R269 Unspecified abnormalities of gait and mobility: Secondary | ICD-10-CM | POA: Diagnosis not present

## 2023-05-07 DIAGNOSIS — R41841 Cognitive communication deficit: Secondary | ICD-10-CM | POA: Diagnosis not present

## 2023-05-07 DIAGNOSIS — M6281 Muscle weakness (generalized): Secondary | ICD-10-CM | POA: Diagnosis not present

## 2023-05-07 DIAGNOSIS — S7291XA Unspecified fracture of right femur, initial encounter for closed fracture: Secondary | ICD-10-CM | POA: Diagnosis not present

## 2023-05-07 DIAGNOSIS — R269 Unspecified abnormalities of gait and mobility: Secondary | ICD-10-CM | POA: Diagnosis not present

## 2023-05-07 DIAGNOSIS — Z4789 Encounter for other orthopedic aftercare: Secondary | ICD-10-CM | POA: Diagnosis not present

## 2023-05-08 DIAGNOSIS — Z4789 Encounter for other orthopedic aftercare: Secondary | ICD-10-CM | POA: Diagnosis not present

## 2023-05-08 DIAGNOSIS — M6281 Muscle weakness (generalized): Secondary | ICD-10-CM | POA: Diagnosis not present

## 2023-05-08 DIAGNOSIS — Z9181 History of falling: Secondary | ICD-10-CM | POA: Diagnosis not present

## 2023-05-11 ENCOUNTER — Encounter: Payer: Self-pay | Admitting: Orthopedic Surgery

## 2023-05-11 ENCOUNTER — Non-Acute Institutional Stay (SKILLED_NURSING_FACILITY): Payer: Self-pay | Admitting: Orthopedic Surgery

## 2023-05-11 ENCOUNTER — Inpatient Hospital Stay: Payer: Medicare Other | Admitting: Adult Health

## 2023-05-11 DIAGNOSIS — I480 Paroxysmal atrial fibrillation: Secondary | ICD-10-CM

## 2023-05-11 DIAGNOSIS — S7291XA Unspecified fracture of right femur, initial encounter for closed fracture: Secondary | ICD-10-CM | POA: Diagnosis not present

## 2023-05-11 DIAGNOSIS — R269 Unspecified abnormalities of gait and mobility: Secondary | ICD-10-CM | POA: Diagnosis not present

## 2023-05-11 DIAGNOSIS — H6123 Impacted cerumen, bilateral: Secondary | ICD-10-CM

## 2023-05-11 DIAGNOSIS — Z8673 Personal history of transient ischemic attack (TIA), and cerebral infarction without residual deficits: Secondary | ICD-10-CM | POA: Diagnosis not present

## 2023-05-11 DIAGNOSIS — Z8781 Personal history of (healed) traumatic fracture: Secondary | ICD-10-CM

## 2023-05-11 DIAGNOSIS — Z4789 Encounter for other orthopedic aftercare: Secondary | ICD-10-CM | POA: Diagnosis not present

## 2023-05-11 DIAGNOSIS — E034 Atrophy of thyroid (acquired): Secondary | ICD-10-CM

## 2023-05-11 DIAGNOSIS — R41841 Cognitive communication deficit: Secondary | ICD-10-CM | POA: Diagnosis not present

## 2023-05-11 DIAGNOSIS — F339 Major depressive disorder, recurrent, unspecified: Secondary | ICD-10-CM

## 2023-05-11 DIAGNOSIS — M6281 Muscle weakness (generalized): Secondary | ICD-10-CM | POA: Diagnosis not present

## 2023-05-11 NOTE — Progress Notes (Unsigned)
 Location:   [facility]  Nursing Home Room Number: 9-A Place of Service:  SNF [phone]) Provider:  Hazle Nordmann, NP  PCP: Shelva Majestic, MD  Patient Care Team: Shelva Majestic, MD as PCP - General (Family Medicine) Wendall Stade, MD as PCP - Cardiology (Cardiology) Mckinley Jewel, MD as Consulting Physician (Ophthalmology) Mitzi Davenport, Minerva Fester, MD as Referring Physician (Ophthalmology) Huston Foley, MD as Consulting Physician (Neurology) Erroll Luna, [facility] (Inactive) as Pharmacist (Pharmacist)  Extended Emergency Contact Information Primary Emergency Contact: McGinnis,Eileen Mobile Phone: [phone] Relation: Other Secondary Emergency Contact: Heyge,Lorna Mobile Phone: [phone] Relation: Friend Interpreter needed? No  Code Status:  FULL CODE Goals of care: Advanced Directive information    05/11/2023   10:19 AM  Advanced Directives  Does Patient Have a Medical Advance Directive? Yes  Type of Estate agent of [place];Living will  Does patient want to make changes to medical advance directive? No - Patient declined  Copy of Healthcare Power of Attorney in Chart? Yes - validated most recent copy scanned in chart (See row information)     Chief Complaint  Patient presents with   Medical Management of Chronic Issues    Routine Visit.    Immunizations    Discuss the need for [employer].     HPI:  Pt is a 89 y.o. female seen today for medical management of chronic diseases.    She currently resides on the skilled nursing unit at [facility]. PMH: left MCA stroke 2015, TIA, atrial fib, MVR, HTN, HLD, Raynaud's, hypothyroidism, macular degeneration, GERD, CKD, and depression.   H/o CVA- left MCA stroke 2015, 01/20 embolic stroke involving left MCA/PCA, right MCA/ACA punctate (symptoms slurred speech and confusion), recent stoke thought to be related to PAF, she was switched from Xarelto to Eliquis, remains on  rosuvastatin S/p right hip fracture- 12/19 right hip hemiarthroplasty, WBAT, pain controlled without medication, ambulating with walker, followed by PT/OT PAF- HR controlled with metoprolol, remains on Eliquis for clot prevention Hypothyroidism- TSH 1.862 04/08/2023, remains on levothyroxine Depression- Na+ 140 04/08/2023, remains on Lexapro  Recent weights:  02/06- 96.1 lbs  01/15- 95.6 lbs  01/08- 95 lbs  Recent blood pressures:  02/18- 105/64  02/11- 126/73  02/04- 117/67           Past Medical History:  Diagnosis Date   Atrial fibrillation (HCC)    AFIB   Collagenous colitis    CVA (cerebral vascular accident) (HCC)    a.  L parietal CVA by MRI 12/12, likely embolic;  b.  TEE by report with neg bubble study;  c.  carotid dopplers  12/12:  + plaque, no sig ICA stenosis    History of postmenopausal HRT    Hx of adenomatous colonic polyps    Hypothyroidism    MVP (mitral valve prolapse)    a. s/p MV repair at [facility] in 2008;  b. Echocardiogram 7/12: EF 50%, status post mitral valve repair with mild MR and minimal MS, mean gradient 4, moderate LAE, LA diam 55 mm; mild RAE, PASP 28-32;    Osteoarthritis    Osteoporosis    Past Surgical History:  Procedure Laterality Date   ANTERIOR APPROACH HEMI HIP ARTHROPLASTY Right 03/05/2023   Procedure: ANTERIOR APPROACH HEMI HIP ARTHROPLASTY;  Surgeon: Samson Frederic, MD;  Location: [facility] ORS;  Service: Orthopedics;  Laterality: Right;   CARDIOVERSION  01/09/2012   Procedure: CARDIOVERSION;  Surgeon: Dolores Patty, MD;  Location: [facility] ENDOSCOPY;  Service:  Cardiovascular;  Laterality: N/A;   CATARACT EXTRACTION     CHOLECYSTECTOMY     DILATION AND CURETTAGE OF UTERUS     LUMBAR FUSION     MITRAL VALVE REPAIR  03/17/2006   "mitral valve repair CE ring and maze procedure"   TONSILLECTOMY      Allergies  Allergen Reactions   Tape Other (See Comments)    SKIN TEARS AND BRUISES VERY EASILY   Rofecoxib Other (See Comments)     Vioxx- Elevated LFT's  Other Reaction(s): Not available    Allergies as of 05/11/2023       Reactions   Tape Other (See Comments)   SKIN TEARS AND BRUISES VERY EASILY   Rofecoxib Other (See Comments)   Vioxx- Elevated LFT's Other Reaction(s): Not available        Medication List        Accurate as of May 11, 2023 10:19 AM. If you have any questions, ask your nurse or doctor.          amoxicillin 500 MG capsule Commonly known as: AMOXIL Take 4 capsules (2,000 mg total) by mouth as needed (1 hour before dental procedure, has history of MVR).   CITRACAL PETITES/VITAMIN D PO Take 1 tablet by mouth 2 (two) times daily.   docusate sodium 100 MG capsule Commonly known as: COLACE Take 1 capsule (100 mg total) by mouth 2 (two) times daily.   Eliquis 2.5 MG Tabs tablet Generic drug: apixaban Take 1 tablet (2.5 mg total) by mouth 2 (two) times daily.   escitalopram 20 MG tablet Commonly known as: LEXAPRO Take 20 mg by mouth daily.   fluticasone 50 MCG/ACT nasal spray Commonly known as: FLONASE Place 2 sprays into both nostrils daily.   glucosamine-chondroitin 500-400 MG tablet Take 1 tablet by mouth 2 (two) times daily.   levothyroxine 75 MCG tablet Commonly known as: SYNTHROID TAKE 1 TABLET BY MOUTH EVERY DAY BEFORE BREAKFAST   loratadine 10 MG tablet Commonly known as: CLARITIN Take 1 tablet (10 mg total) by mouth daily.   metoprolol tartrate 25 MG tablet Commonly known as: LOPRESSOR Take 1 tablet (25 mg total) by mouth 2 (two) times daily.   polyethylene glycol 17 g packet Commonly known as: MIRALAX / GLYCOLAX Take 17 g by mouth daily as needed for mild constipation.   PRESERVISION AREDS 2 PO Take 1 capsule by mouth 2 (two) times daily. Reported on 09/04/2015   rosuvastatin 10 MG tablet Commonly known as: CRESTOR TAKE 1 TABLET BY MOUTH DAILY. GENERIC EQUIVALENT FOR CRESTOR   Simply Saline 0.9 % Aers Generic drug: Saline Place 2 each into the  nose daily as needed.   Tylenol 8 Hour Arthritis Pain 650 MG CR tablet Generic drug: acetaminophen Take 650 mg by mouth in the morning.   vitamin A 45409 UNIT capsule Take 1 capsule by mouth daily.   zinc oxide 20 % ointment Apply 1 Application topically as needed for irritation.        Review of Systems  Constitutional:  Negative for activity change and appetite change.  HENT:  Negative for sore throat and trouble swallowing.   Respiratory:  Negative for cough and shortness of breath.   Cardiovascular:  Negative for chest pain and leg swelling.  Gastrointestinal:  Negative for abdominal distention and abdominal pain.  Genitourinary:  Negative for dysuria and hematuria.  Musculoskeletal:  Positive for gait problem.  Skin:  Negative for wound.  Neurological:  Positive for weakness. Negative for dizziness and  headaches.  Psychiatric/Behavioral:  Positive for dysphoric mood. Negative for confusion and sleep disturbance. The patient is not nervous/anxious.     Immunization History  Administered Date(s) Administered   Fluad Quad(high Dose 65+) 11/30/2018, 01/17/2020   Fluad Trivalent(High Dose 65+) 03/02/2023   Influenza Split 12/17/2010, 12/02/2011   Influenza Whole 03/17/2001, 12/18/2006, 12/28/2007, 01/03/2009, 12/26/2009   Influenza, High Dose Seasonal PF 12/22/2012, 03/06/2015, 11/26/2015, 12/09/2017   Influenza,inj,Quad PF,6+ Mos 11/23/2013   Influenza-Unspecified 11/20/2016   PFIZER(Purple Top)SARS-COV-2 Vaccination 06/09/2019, 06/30/2019, 02/16/2020   Pneumococcal Conjugate-13 03/02/2014   Pneumococcal Polysaccharide-23 03/17/2000, 09/16/2006   Td 03/18/1995, 09/15/2006, 02/22/2021   Tdap 12/17/2010   Unspecified SARS-COV-2 Vaccination 12/08/2020   Zoster Recombinant(Shingrix) 09/18/2016, 11/20/2016   Pertinent  Health Maintenance Due  Topic Date Due   INFLUENZA VACCINE  Completed   DEXA SCAN  Completed      12/12/2020    3:12 PM 11/25/2021   10:46 AM 02/11/2022    11:26 AM 05/26/2022   11:09 AM 03/02/2023    3:17 PM  Fall Risk  Falls in the past year? 0 0 1 1 1   Was there an injury with Fall? 0 0 1 1 0  Was there an injury with Fall? - Comments   fractured rib    Fall Risk Category Calculator 0 0 3 3 2   Fall Risk Category (Retired) Low Low High    (RETIRED) Patient Fall Risk Level Low fall risk Low fall risk Low fall risk    Patient at Risk for Falls Due to No Fall Risks No Fall Risks No Fall Risks History of fall(s) Impaired balance/gait;Impaired mobility;History of fall(s)  Fall risk Follow up  Falls evaluation completed Falls evaluation completed Falls evaluation completed Falls prevention discussed   Functional Status Survey:    Vitals:   05/11/23 1008  BP: 105/64  Pulse: 76  Resp: 19  Temp: (!) 96.9 F (36.1 C)  SpO2: 93%  Weight: 96 lb 1.6 oz (43.6 kg)  Height: 5' (1.524 m)   Body mass index is 18.77 kg/m. Physical Exam Vitals reviewed.  Constitutional:      General: She is not in acute distress. HENT:     Head: Normocephalic.     Right Ear: There is impacted cerumen.     Left Ear: There is impacted cerumen.     Nose: Nose normal.     Mouth/Throat:     Mouth: Mucous membranes are moist.  Eyes:     General:        Right eye: No discharge.        Left eye: No discharge.  Cardiovascular:     Rate and Rhythm: Normal rate. Rhythm irregular.     Pulses: Normal pulses.     Heart sounds: Normal heart sounds.  Pulmonary:     Effort: Pulmonary effort is normal. No respiratory distress.     Breath sounds: Normal breath sounds. No wheezing.  Abdominal:     General: Bowel sounds are normal.     Palpations: Abdomen is soft.  Musculoskeletal:     Cervical back: Neck supple.     Right lower leg: No edema.     Left lower leg: No edema.  Skin:    General: Skin is warm.     Capillary Refill: Capillary refill takes less than 2 seconds.  Neurological:     General: No focal deficit present.     Mental Status: She is alert and  oriented to person, place, and time.  Motor: Weakness present.     Gait: Gait abnormal.     Comments: walker  Psychiatric:        Mood and Affect: Mood normal.     Labs reviewed: Recent Labs    03/06/23 0337 03/07/23 0352 03/08/23 0352 04/06/23 1326 04/08/23 0438 04/08/23 0439  NA 130* 133* 139 137 140  --   K 4.0 4.6 4.4 5.0 3.6  --   CL 97* 98 103 101 105  --   CO2 26 29 30 26 23   --   GLUCOSE 154* 131* 95 98 89  --   BUN 25* 34* 27* 25* 24*  --   CREATININE 0.93 0.89 0.64 0.94 0.89  --   CALCIUM 7.7* 8.5* 8.6* 9.3 8.6*  --   MG  --  2.1 2.2  --   --  2.0  PHOS 3.1 1.6* 3.1  --   --   --    Recent Labs    05/26/22 1200 03/03/23 2200 03/05/23 0748 03/07/23 0352 03/08/23 0352 04/06/23 1326  AST 28 29  --   --   --  33  ALT 36* 42  --   --   --  28  ALKPHOS 73 81  --   --   --  159*  BILITOT 0.4 0.7  --   --   --  0.7  PROT 6.3 5.9*  --   --   --  6.2*  ALBUMIN 4.2 3.6   < > 2.8* 2.8* 3.6   < > = values in this interval not displayed.   Recent Labs    03/03/23 2124 03/05/23 0748 03/07/23 0352 03/08/23 0352 04/06/23 1326  WBC 10.9*   < > 13.3* 9.5 10.5  NEUTROABS 9.0*  --   --   --  8.0*  HGB 13.4   < > 11.7* 11.4* 11.9*  HCT 41.0   < > 36.3 34.7* 37.9  MCV 102.2*   < > 100.6* 99.7 101.6*  PLT 150   < > 173 167 191   < > = values in this interval not displayed.   Lab Results  Component Value Date   TSH 1.862 04/08/2023   Lab Results  Component Value Date   HGBA1C 5.5 03/06/2023   Lab Results  Component Value Date   CHOL 132 04/07/2023   HDL 66 04/07/2023   LDLCALC 57 04/07/2023   LDLDIRECT 47.0 09/04/2015   TRIG 43 04/07/2023   CHOLHDL 2.0 04/07/2023    Significant Diagnostic Results in last 30 days:  No results found.  Assessment/Plan 1. Bilateral impacted cerumen (Primary) - start Debrox at bedtime x 5 days - flush ears when debrox complete  2. History of ischemic left MCA stroke - neuro follow up 03/05 - left MCA stroke  2015 - 01/20 embolic stroke ( involved left and right MCA/PCA) > slurred speech and confusion presentation - Xarelto switched to Eliquis - cont rosuvastatin  3. S/P right hip fracture - 12/19 right hip hemiarthroplasty - WBAT  - pain controlled   4. Paroxysmal atrial fibrillation (HCC) - HR< 100 with metoprolol - cont Eliquis for clot prevention  5. Hypothyroidism due to acquired atrophy of thyroid - TSH stable - cont levothyroxine  6. Recurrent depression (HCC) - Na+ stable - cont Lexapro     Family/ staff Communication: plan discussed with patient and nurse  Labs/tests ordered:  none

## 2023-05-12 DIAGNOSIS — Z9181 History of falling: Secondary | ICD-10-CM | POA: Diagnosis not present

## 2023-05-12 DIAGNOSIS — S7291XA Unspecified fracture of right femur, initial encounter for closed fracture: Secondary | ICD-10-CM | POA: Diagnosis not present

## 2023-05-12 DIAGNOSIS — M6281 Muscle weakness (generalized): Secondary | ICD-10-CM | POA: Diagnosis not present

## 2023-05-12 DIAGNOSIS — Z4789 Encounter for other orthopedic aftercare: Secondary | ICD-10-CM | POA: Diagnosis not present

## 2023-05-12 DIAGNOSIS — R41841 Cognitive communication deficit: Secondary | ICD-10-CM | POA: Diagnosis not present

## 2023-05-12 MED ORDER — DEBROX 6.5 % OT SOLN: 5.0000 [drp] | Freq: Every day | OTIC | Status: DC

## 2023-05-13 DIAGNOSIS — M6281 Muscle weakness (generalized): Secondary | ICD-10-CM | POA: Diagnosis not present

## 2023-05-13 DIAGNOSIS — R269 Unspecified abnormalities of gait and mobility: Secondary | ICD-10-CM | POA: Diagnosis not present

## 2023-05-13 DIAGNOSIS — Z4789 Encounter for other orthopedic aftercare: Secondary | ICD-10-CM | POA: Diagnosis not present

## 2023-05-13 DIAGNOSIS — Z9181 History of falling: Secondary | ICD-10-CM | POA: Diagnosis not present

## 2023-05-14 ENCOUNTER — Emergency Department (HOSPITAL_COMMUNITY): Payer: Medicare Other

## 2023-05-14 ENCOUNTER — Other Ambulatory Visit: Payer: Self-pay

## 2023-05-14 ENCOUNTER — Emergency Department (HOSPITAL_COMMUNITY)
Admission: EM | Admit: 2023-05-14 | Discharge: 2023-05-14 | Disposition: A | Discharge status: Skilled Nursing Facility | Payer: Medicare Other | Attending: Emergency Medicine | Admitting: Emergency Medicine

## 2023-05-14 DIAGNOSIS — R29898 Other symptoms and signs involving the musculoskeletal system: Secondary | ICD-10-CM | POA: Diagnosis not present

## 2023-05-14 DIAGNOSIS — Z8669 Personal history of other diseases of the nervous system and sense organs: Secondary | ICD-10-CM | POA: Diagnosis not present

## 2023-05-14 DIAGNOSIS — Z7901 Long term (current) use of anticoagulants: Secondary | ICD-10-CM | POA: Insufficient documentation

## 2023-05-14 DIAGNOSIS — R410 Disorientation, unspecified: Secondary | ICD-10-CM | POA: Diagnosis not present

## 2023-05-14 DIAGNOSIS — E039 Hypothyroidism, unspecified: Secondary | ICD-10-CM | POA: Insufficient documentation

## 2023-05-14 DIAGNOSIS — Z87898 Personal history of other specified conditions: Secondary | ICD-10-CM

## 2023-05-14 DIAGNOSIS — R531 Weakness: Secondary | ICD-10-CM | POA: Insufficient documentation

## 2023-05-14 DIAGNOSIS — R9089 Other abnormal findings on diagnostic imaging of central nervous system: Secondary | ICD-10-CM | POA: Diagnosis not present

## 2023-05-14 DIAGNOSIS — N189 Chronic kidney disease, unspecified: Secondary | ICD-10-CM | POA: Insufficient documentation

## 2023-05-14 DIAGNOSIS — R29810 Facial weakness: Secondary | ICD-10-CM | POA: Diagnosis not present

## 2023-05-14 DIAGNOSIS — R569 Unspecified convulsions: Secondary | ICD-10-CM | POA: Diagnosis not present

## 2023-05-14 DIAGNOSIS — R000 Tachycardia, unspecified: Secondary | ICD-10-CM | POA: Diagnosis not present

## 2023-05-14 DIAGNOSIS — R4702 Dysphasia: Secondary | ICD-10-CM | POA: Diagnosis not present

## 2023-05-14 DIAGNOSIS — G9389 Other specified disorders of brain: Secondary | ICD-10-CM | POA: Diagnosis not present

## 2023-05-14 DIAGNOSIS — I6782 Cerebral ischemia: Secondary | ICD-10-CM | POA: Diagnosis not present

## 2023-05-14 DIAGNOSIS — R29818 Other symptoms and signs involving the nervous system: Secondary | ICD-10-CM | POA: Diagnosis not present

## 2023-05-14 DIAGNOSIS — Z743 Need for continuous supervision: Secondary | ICD-10-CM | POA: Diagnosis not present

## 2023-05-14 DIAGNOSIS — I6381 Other cerebral infarction due to occlusion or stenosis of small artery: Secondary | ICD-10-CM | POA: Diagnosis not present

## 2023-05-14 DIAGNOSIS — Z79899 Other long term (current) drug therapy: Secondary | ICD-10-CM | POA: Insufficient documentation

## 2023-05-14 DIAGNOSIS — I10 Essential (primary) hypertension: Secondary | ICD-10-CM | POA: Diagnosis not present

## 2023-05-14 LAB — POCT I-STAT, CHEM 8
BUN: 22 mg/dL (ref 8–23)
Calcium, Ion: 1.16 mmol/L (ref 1.15–1.40)
Chloride: 103 mmol/L (ref 98–111)
Creatinine, Ser: 1 mg/dL (ref 0.44–1.00)
Glucose, Bld: 103 mg/dL — ABNORMAL HIGH (ref 70–99)
HCT: 38 % (ref 36.0–46.0)
Hemoglobin: 12.9 g/dL (ref 12.0–15.0)
Potassium: 4.3 mmol/L (ref 3.5–5.1)
Sodium: 139 mmol/L (ref 135–145)
TCO2: 24 mmol/L (ref 22–32)

## 2023-05-14 LAB — DIFFERENTIAL
Abs Immature Granulocytes: 0.03 10*3/uL (ref 0.00–0.07)
Basophils Absolute: 0.1 10*3/uL (ref 0.0–0.1)
Basophils Relative: 1 %
Eosinophils Absolute: 0.1 10*3/uL (ref 0.0–0.5)
Eosinophils Relative: 1 %
Immature Granulocytes: 0 %
Lymphocytes Relative: 12 %
Lymphs Abs: 1.1 10*3/uL (ref 0.7–4.0)
Monocytes Absolute: 0.7 10*3/uL (ref 0.1–1.0)
Monocytes Relative: 8 %
Neutro Abs: 7.2 10*3/uL (ref 1.7–7.7)
Neutrophils Relative %: 78 %

## 2023-05-14 LAB — COMPREHENSIVE METABOLIC PANEL
ALT: 25 U/L (ref 0–44)
AST: 29 U/L (ref 15–41)
Albumin: 3.8 g/dL (ref 3.5–5.0)
Alkaline Phosphatase: 127 U/L — ABNORMAL HIGH (ref 38–126)
Anion gap: 11 (ref 5–15)
BUN: 21 mg/dL (ref 8–23)
CO2: 26 mmol/L (ref 22–32)
Calcium: 9.3 mg/dL (ref 8.9–10.3)
Chloride: 102 mmol/L (ref 98–111)
Creatinine, Ser: 0.93 mg/dL (ref 0.44–1.00)
GFR, Estimated: 59 mL/min — ABNORMAL LOW (ref >60)
Glucose, Bld: 110 mg/dL — ABNORMAL HIGH (ref 70–99)
Potassium: 4.3 mmol/L (ref 3.5–5.1)
Sodium: 139 mmol/L (ref 135–145)
Total Bilirubin: 0.9 mg/dL (ref 0.0–1.2)
Total Protein: 6.5 g/dL (ref 6.5–8.1)

## 2023-05-14 LAB — CBC
HCT: 39.8 % (ref 36.0–46.0)
Hemoglobin: 12.7 g/dL (ref 12.0–15.0)
MCH: 31.2 pg (ref 26.0–34.0)
MCHC: 31.9 g/dL (ref 30.0–36.0)
MCV: 97.8 fL (ref 80.0–100.0)
Platelets: 173 10*3/uL (ref 150–400)
RBC: 4.07 MIL/uL (ref 3.87–5.11)
RDW: 13.2 % (ref 11.5–15.5)
WBC: 9.3 10*3/uL (ref 4.0–10.5)
nRBC: 0 % (ref 0.0–0.2)

## 2023-05-14 LAB — PROTIME-INR
INR: 1.1 (ref 0.8–1.2)
Prothrombin Time: 14.6 s (ref 11.4–15.2)

## 2023-05-14 LAB — ETHANOL: Alcohol, Ethyl (B): <10 mg/dL (ref <10)

## 2023-05-14 LAB — APTT: aPTT: 31 s (ref 24–36)

## 2023-05-14 LAB — GLUCOSE, CAPILLARY: Glucose-Capillary: 99 mg/dL (ref 70–99)

## 2023-05-14 MED ORDER — LACOSAMIDE 50 MG PO TABS: 50.0000 mg | ORAL_TABLET | Freq: Two times a day (BID) | ORAL | 2 refills | Status: DC

## 2023-05-14 NOTE — Consult Note (Addendum)
 NEUROLOGY CONSULT NOTE   Date of service: May 14, 2023 Patient Name: Theresa [name] MRN:  [id number] DOB:  1931/06/29 Chief Complaint: "Episode of aphasia and left-sided weakness" Requesting Provider: No att. providers found  History of Present Illness  Ariana Juul is a 89 y.o. female with hx of stroke, migraines, A-fib on Eliquis, hyperlipidemia, hypothyroidism, CKD, arthritis and mitral valve prolapse who presents with a transient episode of aphasia and left-sided weakness which occurred at her facility this morning.  Patient has no memory of this episode and is uncertain what happened.  She is able to state that staff at her facility thought she was having a stroke.  Patient was seen in January with punctate infarcts in the left cerebellum, left occipital lobe, bilateral frontal lobes and right parietal lobe, and at that time she was switched from Xarelto to Eliquis.  On exam, she has an NIHSS of 0 and some mild hesitancy and delay in speech, markedly improving over the course of transport per EMS.  Of note, prior neurology evaluation 02/2013 for similar confusional episode (Dr. Everlena Cooper outpatient) patient was noted to have an episode of disorientation which resolved spontaneously.  At that time, she also had some difficulties with word finding and memory issues.  LKW: 0800 Modified rankin score: 3-Moderate disability-requires help but walks WITHOUT assistance IV Thrombolysis: No, symptoms resolved EVT: No, symptoms resolved   NIHSS components Score: Comment  1a Level of Conscious 0[x]  1[]  2[]  3[]      1b LOC Questions 0[x]  1[]  2[]       1c LOC Commands 0[x]  1[]  2[]       2 Best Gaze 0[x]  1[]  2[]       3 Visual 0[x]  1[]  2[]  3[]      4 Facial Palsy 0[x]  1[]  2[]  3[]      5a Motor Arm - left 0[x]  1[]  2[]  3[]  4[]  UN[]    5b Motor Arm - Right 0[x]  1[]  2[]  3[]  4[]  UN[]    6a Motor Leg - Left 0[x]  1[]  2[]  3[]  4[]  UN[]    6b Motor Leg - Right 0[x]  1[]  2[]  3[]  4[]  UN[]    7 Limb Ataxia  0[x]  1[]  2[]  3[]  UN[]     8 Sensory 0[x]  1[]  2[]  UN[]      9 Best Language 0[x]  1[]  2[]  3[]      10 Dysarthria 0[x]  1[]  2[]  UN[]      11 Extinct. and Inattention 0[x]  1[]  2[]       TOTAL:0       ROS  Comprehensive ROS performed and pertinent positives documented in HPI   Past History   Past Medical History:  Diagnosis Date   Atrial fibrillation (HCC)    AFIB   Collagenous colitis    CVA (cerebral vascular accident) (HCC)    a.  L parietal CVA by MRI 12/12, likely embolic;  b.  TEE by report with neg bubble study;  c.  carotid dopplers  12/12:  + plaque, no sig ICA stenosis    History of postmenopausal HRT    Hx of adenomatous colonic polyps    Hypothyroidism    MVP (mitral valve prolapse)    a. s/p MV repair at [facility] in 2008;  b. Echocardiogram 7/12: EF 50%, status post mitral valve repair with mild MR and minimal MS, mean gradient 4, moderate LAE, LA diam 55 mm; mild RAE, PASP 28-32;    Osteoarthritis    Osteoporosis     Past Surgical History:  Procedure Laterality Date   ANTERIOR APPROACH HEMI HIP ARTHROPLASTY Right 03/05/2023  Procedure: ANTERIOR APPROACH HEMI HIP ARTHROPLASTY;  Surgeon: Samson Frederic, MD;  Location: [facility];  Service: Orthopedics;  Laterality: Right;   CARDIOVERSION  01/09/2012   Procedure: CARDIOVERSION;  Surgeon: Dolores Patty, MD;  Location: [facility] ENDOSCOPY;  Service: Cardiovascular;  Laterality: N/A;   CATARACT EXTRACTION     CHOLECYSTECTOMY     DILATION AND CURETTAGE OF UTERUS     LUMBAR FUSION     MITRAL VALVE REPAIR  03/17/2006   "mitral valve repair CE ring and maze procedure"   TONSILLECTOMY      Family History: Family History  Problem Relation Age of Onset   Cancer Father        COLON   Stroke Neg Hx        1ST DEGREE RELATIVE <60    Social History  reports that she quit smoking about 57 years ago. Her smoking use included cigarettes. She has never used smokeless tobacco. She reports current alcohol use of about 7.0 standard drinks of  alcohol per week. She reports that she does not use drugs.  Allergies  Allergen Reactions   Tape Other (See Comments)    SKIN TEARS AND BRUISES VERY EASILY   Rofecoxib Other (See Comments)    Vioxx- Elevated LFT's  Other Reaction(s): Not available    Medications  No current facility-administered medications for this encounter.  Current Outpatient Medications:    amoxicillin (AMOXIL) 500 MG capsule, Take 4 capsules (2,000 mg total) by mouth as needed (1 hour before dental procedure, has history of MVR)., Disp: 4 capsule, Rfl: 0   apixaban (ELIQUIS) 2.5 MG TABS tablet, Take 1 tablet (2.5 mg total) by mouth 2 (two) times daily., Disp: 60 tablet, Rfl: 0   Calcium Citrate-Vitamin D (CITRACAL PETITES/VITAMIN D PO), Take 1 tablet by mouth 2 (two) times daily., Disp: , Rfl:    carbamide peroxide (DEBROX) 6.5 % OTIC solution, Place 5 drops into both ears at bedtime for 5 days., Disp: , Rfl:    docusate sodium (COLACE) 100 MG capsule, Take 1 capsule (100 mg total) by mouth 2 (two) times daily., Disp: , Rfl:    escitalopram (LEXAPRO) 20 MG tablet, Take 20 mg by mouth daily., Disp: , Rfl:    fluticasone (FLONASE) 50 MCG/ACT nasal spray, Place 2 sprays into both nostrils daily., Disp: 16 g, Rfl: 6   glucosamine-chondroitin 500-400 MG tablet, Take 1 tablet by mouth 2 (two) times daily., Disp: , Rfl:    levothyroxine (SYNTHROID) 75 MCG tablet, TAKE 1 TABLET BY MOUTH EVERY DAY BEFORE BREAKFAST, Disp: 90 tablet, Rfl: 3   loratadine (CLARITIN) 10 MG tablet, Take 1 tablet (10 mg total) by mouth daily., Disp: 30 tablet, Rfl: 11   metoprolol tartrate (LOPRESSOR) 25 MG tablet, Take 1 tablet (25 mg total) by mouth 2 (two) times daily., Disp: , Rfl:    Multiple Vitamins-Minerals (PRESERVISION AREDS 2 PO), Take 1 capsule by mouth 2 (two) times daily. Reported on 09/04/2015, Disp: , Rfl:    polyethylene glycol (MIRALAX / GLYCOLAX) 17 g packet, Take 17 g by mouth daily as needed for mild constipation., Disp: , Rfl:     rosuvastatin (CRESTOR) 10 MG tablet, TAKE 1 TABLET BY MOUTH DAILY. GENERIC EQUIVALENT FOR CRESTOR, Disp: 90 tablet, Rfl: 3   Saline (SIMPLY SALINE) 0.9 % AERS, Place 2 each into the nose daily as needed., Disp: 500 mL, Rfl: 1   TYLENOL 8 HOUR ARTHRITIS PAIN 650 MG CR tablet, Take 650 mg by mouth in the morning., Disp: ,  Rfl:    vitamin A 30865 UNIT capsule, Take 1 capsule by mouth daily., Disp: , Rfl:    zinc oxide 20 % ointment, Apply 1 Application topically as needed for irritation., Disp: , Rfl:   Vitals   Vitals:   May 23, 2023 0900  Weight: 46 kg    Body mass index is 19.81 kg/m.  Physical Exam   Constitutional: Well developed, well-nourished elderly patient in no acute distress Psych: Affect appropriate to situation.  Eyes: No scleral injection.  HENT: No OP obstruction.  Head: Normocephalic.  Cardiovascular: A-fib on the monitor Respiratory: Effort normal, non-labored breathing.  Skin: WDI.   Neurologic Examination    NEURO:  Mental Status: Alert and oriented to person place time and situation, able to give some history of present illness Speech/Language: speech is without dysarthria or aphasia.  Naming, repetition and comprehension intact.  Some hesitation with speech and minor word finding difficulties noted  Cranial Nerves:  II: PERRL. Visual fields full.  III, IV, VI: EOMI. Eyelids elevate symmetrically.  V: Sensation is intact to light touch and symmetrical to face.  VII: Smile is symmetrical.  VIII: hearing intact to voice. IX, X: Phonation is normal.  XII: tongue is midline without fasciculations. Motor: Able to move all 4 extremities with good antigravity strength Tone: is normal and bulk is normal Sensation- Intact to light touch bilaterally. Extinction absent to light touch to DSS.   Coordination: FTN intact bilaterally, HKS: no ataxia in BLE. Gait- deferred   Labs/Imaging/Neurodiagnostic studies   CBC:  Recent Labs  Lab 05-23-23 0948  05/23/2023 0954  WBC 9.3  --   NEUTROABS 7.2  --   HGB 12.7 12.9  HCT 39.8 38.0  MCV 97.8  --   PLT 173  --    Basic Metabolic Panel:  Lab Results  Component Value Date   NA 139 2023-05-23   K 4.3 May 23, 2023   CO2 23 04/08/2023   GLUCOSE 103 (H) May 23, 2023   BUN 22 2023/05/23   CREATININE 1.00 23-May-2023   CALCIUM 8.6 (L) 04/08/2023   GFRNONAA >60 04/08/2023   GFRAA 76 (L) 03/21/2013   Lipid Panel:  Lab Results  Component Value Date   LDLCALC 57 04/07/2023   HgbA1c:  Lab Results  Component Value Date   HGBA1C 5.5 03/06/2023   Urine Drug Screen:     Component Value Date/Time   LABOPIA NONE DETECTED 03/21/2013 1242   COCAINSCRNUR NONE DETECTED 03/21/2013 1242   LABBENZ NONE DETECTED 03/21/2013 1242   AMPHETMU NONE DETECTED 03/21/2013 1242   THCU NONE DETECTED 03/21/2013 1242   LABBARB NONE DETECTED 03/21/2013 1242    Alcohol Level     Component Value Date/Time   ETH <11 03/21/2013 1204   INR  Lab Results  Component Value Date   INR 1.1 04/06/2023   APTT  Lab Results  Component Value Date   APTT 32 04/06/2023   AED levels: No results found for: "PHENYTOIN", "ZONISAMIDE", "LAMOTRIGINE", "LEVETIRACETA"  CT Head without contrast(Personally reviewed): No acute abnormality, remote infarct in left frontal operculum, also remote infarcts in cerebellum  MRI Brain(Personally reviewed): 1. No acute intracranial abnormality. 2. Chronic microvascular ischemic changes and parenchymal volume loss. 3. Remote infarct in the left frontal operculum. Additional small remote infarcts in the bilateral basal ganglia and cerebellum.  Neurodiagnostics rEEG:  ABNORMALITY - Intermittent slow, left fronto-temporal region IMPRESSION: This study is suggestive of cortical dysfunction arising from left  fronto-temporal region. No seizures or epileptiform discharges were seen throughout the  recording. Please note lack of epileptiform activity during interictal EEG does not  exclude the diagnosis of epilepsy.  ASSESSMENT   Karyl Sharrar is a 89 y.o. female with history of stroke, migraines, A-fib on Eliquis, hyperlipidemia, hypothyroidism, CKD, arthritis and mitral valve prolapse who presents with a transient episode of aphasia and left-sided weakness this morning.  On arrival to the ED, symptoms appear to be completely resolved and NIHSS was 0.  CT head revealed no acute abnormality.  Given patient's recent stroke, will obtain brain MRI.  Will also obtain EEG to rule out seizure activity as patient does have a possible seizure focus in the location of her old stroke.  Of note, she has been having transient episodes of disorientation since 2014 as documented in her neurology notes.  Will send basic AMS labs, thiamine, B6, B12, folate, RPR, HIV and TSH.  If stroke is seen on MRI, may also consider outpatient follow-up for possible left atrial appendage closure.  If MRI and EEG are negative, suspect that this episode of disorientation may be a dementia related fluctuation and would recommend outpatient neurocognitive testing.  RECOMMENDATIONS  - MRI brain reviewed, negative for acute process - Routine EEG - AMS labs, to include thiamine, B6, B12, folate, RPR, HIV and TSH -- Neurology will follow-up MRI brain and EEG and addend this note below with final recommendations ADDENDUM > Intermittent slowing can be seen in areas of the brain with a propensity for focal seizures, and this does correlate with her area of encephalomalacia which would be expected to result in intermittent aphasia for focal seizures. Favor treating this as possible focal seizure -- Vimpat 50 mg twice daily -- Outpatient neurology follow-up, already scheduled for 05/20/2023  ______________________________________________________________________  Patient seen by NP with MD, MD to edit note as needed. Cortney E Ernestina Columbia , MSN, AGACNP-BC [facility] See Amion for schedule and pager  information 05/14/2023 10:23 AM   Attending Neurologist's note:  I personally saw this patient, gathering history, performing a full neurologic examination, reviewing relevant labs, personally reviewing relevant imaging including Head CT, and formulated the assessment and plan, adding the note above for completeness and clarity to accurately reflect my thoughts  Signed, Gordy Councilman, MD [facility]  CRITICAL CARE Performed by: Gordy Councilman   Total critical care time: 45 minutes  Critical care time was exclusive of separately billable procedures and treating other patients.  Critical care was necessary to treat or prevent imminent or life-threatening deterioration; emergent evaluation for consideration of thrombectomy or thrombolytic  Critical care was time spent personally by me on the following activities: development of treatment plan with patient and/or surrogate as well as nursing, discussions with consultants, evaluation of patient's response to treatment, examination of patient, obtaining history from patient or surrogate, ordering and performing treatments and interventions, ordering and review of laboratory studies, ordering and review of radiographic studies, pulse oximetry and re-evaluation of patient's condition.

## 2023-05-14 NOTE — ED Notes (Signed)
 [name]

## 2023-05-14 NOTE — Procedures (Signed)
 Patient Name: Artice Holohan  MRN: [id number]  Epilepsy Attending: Charlsie Quest  Referring Physician/Provider: Gordy Councilman, MD  Date: 05/14/2023 Duration: 23.38 mins  Patient history: 89yo F with an episode of aphasia and left sided weakness. EEG to evaluate for seizure  Level of alertness: Awake  AEDs during EEG study: None  Technical aspects: This EEG study was done with scalp electrodes positioned according to the 10-20 International system of electrode placement. Electrical activity was reviewed with band pass filter of 1-70Hz , sensitivity of 7 uV/mm, display speed of 39mm/sec with a 60Hz  notched filter applied as appropriate. EEG data were recorded continuously and digitally stored.  Video monitoring was available and reviewed as appropriate.  Description: The posterior dominant rhythm consists of 8 Hz activity of moderate voltage (25-35 uV) seen predominantly in posterior head regions, symmetric and reactive to eye opening and eye closing. EEG showed intermittent 3 to 6 Hz theta-delta slowing in left fronto-temporal region. Hyperventilation and photic stimulation were not performed.     ABNORMALITY - Intermittent slow, left fronto-temporal region  IMPRESSION: This study is suggestive of cortical dysfunction arising from left  fronto-temporal region. No seizures or epileptiform discharges were seen throughout the recording.  Please note lack of epileptiform activity during interictal EEG does not exclude the diagnosis of epilepsy.  Tyshon Fanning Annabelle Harman

## 2023-05-14 NOTE — Progress Notes (Signed)
 EEG complete - results pending

## 2023-05-14 NOTE — ED Provider Notes (Signed)
  Physical Exam  BP (!) 152/105   Pulse 96   Temp 97.9 F (36.6 C) (Oral)   Resp 16   Ht 5' (1.524 m)   Wt 46 kg   LMP  (LMP Unknown)   SpO2 96%   BMI 19.81 kg/m   Physical Exam  Procedures  Procedures  ED Course / MDM   Clinical Course as of 05/16/23 1738  Thu May 14, 2023  1032 CBC, PT/INR, PTT, PO glucose, Istat chem 8 are all unremarkable [HN]  1032 CT HEAD CODE STROKE WO CONTRAST 1. No CT evidence of acute intracranial abnormality. 2. Redemonstrated remote infarct involving the left frontal operculum. Additional small remote infarcts in the bilateral cerebellum. 3. ASPECTS is 10   [HN]  1245 MR BRAIN WO CONTRAST 1. No acute intracranial abnormality. 2. Chronic microvascular ischemic changes and parenchymal volume loss. 3. Remote infarct in the left frontal operculum. Additional small remote infarcts in the bilateral basal ganglia and cerebellum.   [HN]  1511 Assumed care. 89 yo F with hx of sz, stroke, AF on eliquis, and vascular dementia who presented with aphasia and weakness on the L side. MRI without stroke. Getting EEG bc hx of seizures. If negative, neuro (Dr Iver Nestle) feels she can be dc'd. Now back to baseline.  [RP]  1553 Patient reassessed.  Doing well back to her baseline.  Neurology recommends Vimpat 50 mg twice daily and outpatient follow-up which is already scheduled for her.  Patient was updated.  Instructed to follow-up with her neurologist and primary doctor.  Informed that she should not drive for 6 months or perform any activities that could be dangerous to the risk of harm if she should have a seizure. [RP]    Clinical Course User Index [HN] Loetta Rough, MD [RP] Rondel Baton, MD   Medical Decision Making Amount and/or Complexity of Data Reviewed Labs: ordered. Radiology: ordered. Decision-making details documented in ED Course.  Risk Prescription drug management.      Rondel Baton, MD 05/16/23 [phone]

## 2023-05-14 NOTE — Code Documentation (Signed)
 Stroke Response Nurse Documentation Code Documentation  Theresa [name] is a 89 y.o. female arriving to [facility]  via [facility] on 05/14/2023 with past medical hx of stroke, afib on eliquis, HLD, hypothyroidism, CKD, arthritis. On Eliquis (apixaban) daily. Code stroke was activated by EMS.   Patient from faciltiy where she was LKW at 0800 and now complaining of aphasia. Per EMS, facility told them that patient seemed a little weak on her left side and was having trouble finding words. On their arrival, they noticed trouble with word finding and a slight left droop that resolved en route to ED.    Stroke team at the bedside on patient arrival. Labs drawn and patient cleared for CT by Dr. Jearld Fenton. Patient to CT with team. NIHSS 0, see documentation for details and code stroke times. The following imaging was completed:  CT Head. Patient is not a candidate for IV Thrombolytic due to symptoms resolving. Patient is not a candidate for IR due to no LVO on imaging.   Care Plan: VS/NIHSS q2hr x12hr, then q4hr.   Bedside handoff with ED RN Boneta Lucks.    Felecia Jan  Stroke Response RN

## 2023-05-14 NOTE — ED Provider Notes (Signed)
 [place] EMERGENCY DEPARTMENT AT [facility] Provider Note   CSN: [id number] Arrival date & time: 05/14/23  7829  An emergency department physician performed an initial assessment on this suspected stroke patient at 47.  History {Add pertinent medical, surgical, social history, OB history to HPI:1} Chief Complaint  Patient presents with  . Code Stroke    Rola Lennon is a 89 y.o. female with  w/ hx of stroke, migraines, A-fib on Eliquis, hyperlipidemia, hypothyroidism, CKD, arthritis and mitral valve prolapse who presents BIBEMS with a transient episode of aphasia and left-sided weakness which occurred at her facility this morning.  Patient has no memory of this episode and is uncertain what happened.  LKN 8 am today. She is able to state that staff at her facility thought she was having a stroke.  Patient was seen in January with punctate infarcts in the left cerebellum, left occipital lobe, bilateral frontal lobes and right parietal lobe, and at that time she was switched from Xarelto to Eliquis.  On exam, she has an NIHSS of 0 and some mild hesitancy and delay in speech.    Past Medical History:  Diagnosis Date  . Atrial fibrillation (HCC)    AFIB  . Collagenous colitis   . CVA (cerebral vascular accident) (HCC)    a.  L parietal CVA by MRI 12/12, likely embolic;  b.  TEE by report with neg bubble study;  c.  carotid dopplers  12/12:  + plaque, no sig ICA stenosis   . History of postmenopausal HRT   . Hx of adenomatous colonic polyps   . Hypothyroidism   . MVP (mitral valve prolapse)    a. s/p MV repair at [facility] in 2008;  b. Echocardiogram 7/12: EF 50%, status post mitral valve repair with mild MR and minimal MS, mean gradient 4, moderate LAE, LA diam 55 mm; mild RAE, PASP 28-32;   Marland Kitchen Osteoarthritis   . Osteoporosis        Home Medications Prior to Admission medications   Medication Sig Start Date End Date Taking? Authorizing Provider  amoxicillin (AMOXIL)  500 MG capsule Take 4 capsules (2,000 mg total) by mouth as needed (1 hour before dental procedure, has history of MVR). 05/05/23   Medina-Vargas, Monina C, NP  apixaban (ELIQUIS) 2.5 MG TABS tablet Take 1 tablet (2.5 mg total) by mouth 2 (two) times daily. 04/08/23   Rolly Salter, MD  Calcium Citrate-Vitamin D (CITRACAL PETITES/VITAMIN D PO) Take 1 tablet by mouth 2 (two) times daily.    [provider]  carbamide peroxide (DEBROX) 6.5 % OTIC solution Place 5 drops into both ears at bedtime for 5 days. 05/11/23 05/16/23  Fargo, Amy E, NP  docusate sodium (COLACE) 100 MG capsule Take 1 capsule (100 mg total) by mouth 2 (two) times daily. 03/09/23   Rai, Ripudeep K, MD  escitalopram (LEXAPRO) 20 MG tablet Take 20 mg by mouth daily.    [provider]  fluticasone (FLONASE) 50 MCG/ACT nasal spray Place 2 sprays into both nostrils daily. 02/11/22   Lula Olszewski, MD  glucosamine-chondroitin 500-400 MG tablet Take 1 tablet by mouth 2 (two) times daily.    [provider]  levothyroxine (SYNTHROID) 75 MCG tablet TAKE 1 TABLET BY MOUTH EVERY DAY BEFORE BREAKFAST 12/12/22   Shelva Majestic, MD  loratadine (CLARITIN) 10 MG tablet Take 1 tablet (10 mg total) by mouth daily. 02/11/22   Lula Olszewski, MD  metoprolol tartrate (LOPRESSOR) 25  MG tablet Take 1 tablet (25 mg total) by mouth 2 (two) times daily. 03/09/23   Rai, Delene Ruffini, MD  Multiple Vitamins-Minerals (PRESERVISION AREDS 2 PO) Take 1 capsule by mouth 2 (two) times daily. Reported on 09/04/2015    [provider]  polyethylene glycol (MIRALAX / GLYCOLAX) 17 g packet Take 17 g by mouth daily as needed for mild constipation. 03/09/23   Rai, Ripudeep K, MD  rosuvastatin (CRESTOR) 10 MG tablet TAKE 1 TABLET BY MOUTH DAILY. GENERIC EQUIVALENT FOR CRESTOR 12/08/22   Shelva Majestic, MD  Saline (SIMPLY SALINE) 0.9 % AERS Place 2 each into the nose daily as needed. 02/11/22   Lula Olszewski, MD  TYLENOL 8 HOUR  ARTHRITIS PAIN 650 MG CR tablet Take 650 mg by mouth in the morning.    [provider]  vitamin A 96295 UNIT capsule Take 1 capsule by mouth daily.    [provider]  zinc oxide 20 % ointment Apply 1 Application topically as needed for irritation.    [provider]      Allergies    Tape and Rofecoxib    Review of Systems   Review of Systems A 10 point review of systems was performed and is negative unless otherwise reported in HPI.  Physical Exam Updated Vital Signs BP (!) 159/102   Pulse 92   Temp 97.9 F (36.6 C) (Oral)   Resp 18   Ht 5' (1.524 m)   Wt 46 kg   LMP  (LMP Unknown)   SpO2 100%   BMI 19.81 kg/m  Physical Exam General: Normal appearing {Desc; female/female:11659}, lying in bed.  HEENT: PERRLA, Sclera anicteric, MMM, trachea midline.  Cardiology: RRR, no murmurs/rubs/gallops. BL radial and DP pulses equal bilaterally.  Resp: Normal respiratory rate and effort. CTAB, no wheezes, rhonchi, crackles.  Abd: Soft, non-tender, non-distended. No rebound tenderness or guarding.  GU: Deferred. MSK: No peripheral edema or signs of trauma. Extremities without deformity or TTP. No cyanosis or clubbing. Skin: warm, dry. No rashes or lesions. Back: No CVA tenderness Neuro: A&Ox4, CNs II-XII grossly intact. MAEs. Sensation grossly intact.  Psych: Normal mood and affect.   1a  Level of consciousness: {consciousness neuro:31423}  1b. LOC questions:  {loc commands neuro:31401}  1c. LOC commands: {loc commands neuro:31401}  2.  Best Gaze: {best gaze neuro:31402}  3.  Visual: {visual neuro:31403}  4. Facial Palsy: {facial palsy:31404}  5a.  Motor left arm: {motor neuro:31405}  5b.  Motor right arm: {motor neuro:31405}  6a. motor left leg: {motor neuro:31405}  6b  Motor right leg:  {motor neuro:31405}  7. Limb Ataxia: {limb ataxia neuro:31406}  8.  Sensory: {sensory neuro:31407}  9. Best Language:  {best language neuro:31408}  10. Dysarthria:  {dysarthria neuro:31409}  11. Extinction and Inattention: {extinction neuro:31410}   Total:   ***        ED Results / Procedures / Treatments   Labs (all labs ordered are listed, but only abnormal results are displayed) Labs Reviewed  POCT I-STAT, CHEM 8 - Abnormal; Notable for the following components:      Result Value   Glucose, Bld 103 (*)    All other components within normal limits  PROTIME-INR  APTT  CBC  DIFFERENTIAL  GLUCOSE, CAPILLARY  COMPREHENSIVE METABOLIC PANEL  ETHANOL  RPR  TSH  VITAMIN B1  VITAMIN B6  FOLATE  VITAMIN B12  HIV ANTIBODY (ROUTINE TESTING W REFLEX)  I-STAT CHEM 8, ED  CBG MONITORING, ED  EKG None  Radiology CT HEAD CODE STROKE WO CONTRAST Result Date: 05/14/2023 CLINICAL DATA:  Code stroke.  Neuro deficit. EXAM: CT HEAD WITHOUT CONTRAST TECHNIQUE: Contiguous axial images were obtained from the base of the skull through the vertex without intravenous contrast. RADIATION DOSE REDUCTION: This exam was performed according to the departmental dose-optimization program which includes automated exposure control, adjustment of the mA and/or kV according to patient size and/or use of iterative reconstruction technique. COMPARISON:  MRI head 04/06/2023 FINDINGS: Brain: No acute intracranial hemorrhage. No CT evidence of acute infarct. Encephalomalacia in the left frontal operculum compatible with remote infarct. Nonspecific hypoattenuation in the periventricular and subcortical white matter favored to reflect chronic microvascular ischemic changes. Small remote infarcts in the bilateral cerebellum. No edema, mass effect, or midline shift. The basilar cisterns are patent. Ventricles: The ventricles are normal. Vascular: Atherosclerosis of the carotid siphons. No hyperdense vessel. Skull: No acute or aggressive finding. Orbits: Orbits are symmetric. Sinuses: The visualized paranasal sinuses are clear. Other: Mastoid air cells are clear. ASPECTS [facility]  Stroke Program Early CT Score) - Ganglionic level infarction (caudate, lentiform nuclei, internal capsule, insula, M1-M3 cortex): 7 - Supraganglionic infarction (M4-M6 cortex): 3 Total score (0-10 with 10 being normal): 10 IMPRESSION: 1. No CT evidence of acute intracranial abnormality. 2. Redemonstrated remote infarct involving the left frontal operculum. Additional small remote infarcts in the bilateral cerebellum. 3. ASPECTS is 10 These results were communicated to Dr. Iver Nestle At 10:06 am on 05/14/2023 by text page via the [facility] messaging system. Electronically Signed   By: Emily Filbert M.D.   On: 05/14/2023 10:07    Procedures Procedures  {Document cardiac monitor, telemetry assessment procedure when appropriate:1}  Medications Ordered in ED Medications - No data to display  ED Course/ Medical Decision Making/ A&P                          Medical Decision Making Amount and/or Complexity of Data Reviewed Labs: ordered. Radiology: ordered. Decision-making details documented in ED Course.  Risk Prescription drug management.    This patient presents to the ED for concern of ***, this involves an extensive number of treatment options, and is a complaint that carries with it a high risk of complications and morbidity.  I considered the following differential and admission for this acute, potentially life threatening condition.   MDM:    Given the acute onset of neurological symptoms that have since resolved, TIA is the most concerning etiology of these acute symptoms. The neuro exam is significant for ***.   Per neurology recommendations:  CT head revealed no acute abnormality.  Given patient's recent stroke, will obtain brain MRI.  Will also obtain EEG to rule out seizure activity as patient does have a possible seizure focus in the location of her old stroke.  Of note, she has been having transient episodes of disorientation since 2014 as documented in her neurology notes.  Will send  basic AMS labs, thiamine, B6, B12, folate, RPR, HIV and TSH.  If stroke is seen on MRI, will consider outpatient follow-up for possible left atrial appendage closure.  If MRI is negative, suspect that this episode of disorientation may be a dementia related fluctuation and would recommend outpatient neurocognitive testing.    Clinical Course as of 05/25/23 0225  Thu May 14, 2023  1032 CBC, PT/INR, PTT, PO glucose, Istat chem 8 are all unremarkable [HN]  1032 CT HEAD CODE STROKE WO CONTRAST 1. No  CT evidence of acute intracranial abnormality. 2. Redemonstrated remote infarct involving the left frontal operculum. Additional small remote infarcts in the bilateral cerebellum. 3. ASPECTS is 10   [HN]  1245 MR BRAIN WO CONTRAST 1. No acute intracranial abnormality. 2. Chronic microvascular ischemic changes and parenchymal volume loss. 3. Remote infarct in the left frontal operculum. Additional small remote infarcts in the bilateral basal ganglia and cerebellum.   [HN]  1511 Assumed care. 89 yo F with hx of sz, stroke, AF on eliquis, and vascular dementia who presented with aphasia and weakness on the L side. MRI without stroke. Getting EEG bc hx of seizures. If negative, neuro (Dr Iver Nestle) feels she can be dc'd. Now back to baseline.  [RP]  1553 Patient reassessed.  Doing well back to her baseline.  Neurology recommends Vimpat 50 mg twice daily and outpatient follow-up which is already scheduled for her.  Patient was updated.  Instructed to follow-up with her neurologist and primary doctor.  Informed that she should not drive for 6 months or perform any activities that could be dangerous to the risk of harm if she should have a seizure. [RP]    Clinical Course User Index [HN] Loetta Rough, MD [RP] Rondel Baton, MD    Labs: I Ordered, and personally interpreted labs.  The pertinent results include:  those listed above  Imaging Studies ordered: CTH, MRI ordered by neurology I  independently visualized and interpreted imaging. I agree with the radiologist interpretation  Additional history obtained from chart review, EMS.    Cardiac Monitoring: .The patient was maintained on a cardiac monitor.  I personally viewed and interpreted the cardiac monitored which showed an underlying rhythm of: ***  Reevaluation: After the interventions noted above, I reevaluated the patient and found that they have :{resolved/improved/worsened:23923::"improved"}  Social Determinants of Health: .***  Disposition:  Admit to hospitalist  Co morbidities that complicate the patient evaluation . Past Medical History:  Diagnosis Date  . Atrial fibrillation (HCC)    AFIB  . Collagenous colitis   . CVA (cerebral vascular accident) (HCC)    a.  L parietal CVA by MRI 12/12, likely embolic;  b.  TEE by report with neg bubble study;  c.  carotid dopplers  12/12:  + plaque, no sig ICA stenosis   . History of postmenopausal HRT   . Hx of adenomatous colonic polyps   . Hypothyroidism   . MVP (mitral valve prolapse)    a. s/p MV repair at [facility] in 2008;  b. Echocardiogram 7/12: EF 50%, status post mitral valve repair with mild MR and minimal MS, mean gradient 4, moderate LAE, LA diam 55 mm; mild RAE, PASP 28-32;   Marland Kitchen Osteoarthritis   . Osteoporosis      Medicines No orders of the defined types were placed in this encounter.   I have reviewed the patients home medicines and have made adjustments as needed  Problem List / ED Course: Problem List Items Addressed This Visit   None        {Document critical care time when appropriate:1} {Document review of labs and clinical decision tools ie heart score, Chads2Vasc2 etc:1}  {Document your independent review of radiology images, and any outside records:1} {Document your discussion with family members, caretakers, and with consultants:1} {Document social determinants of health affecting pt's care:1} {Document your decision making why  or why not admission, treatments were needed:1}  This note was created using dictation software, which may contain spelling or grammatical errors.

## 2023-05-14 NOTE — Discharge Instructions (Signed)
 You were seen for your weakness which may have been from a seizure in the emergency department.   At home, please continue to take the seizure medication that you have been prescribed (vimpat).  Refrain from activities that could be dangerous when you have a seizure such as climbing a ladder or swimming.  Do not drive for at least 6 months.  Talk to your neurologist to see when it is safe to resume these activities.    Check your MyChart online for the results of any tests that had not resulted by the time you left the emergency department.   Follow-up with your primary doctor in 2-3 days regarding your visit.  Follow-up with your neurologist.  Return immediately to the emergency department if you experience any of the following: Seizures lasting more than 5 minutes, or any other concerning symptoms.    Thank you for visiting our Emergency Department. It was a pleasure taking care of you today.

## 2023-05-14 NOTE — ED Triage Notes (Signed)
 Pt to ed via ems with Cc of code stroke with symptoms of left sided weakness left facial droop and aphasia per nursing facility. EMS relays witnessing left facial droop only. Pt had a LSN at 8 am. All symptoms resolved in ED pt has NIH of 0.

## 2023-05-15 ENCOUNTER — Encounter: Payer: Self-pay | Admitting: Orthopedic Surgery

## 2023-05-15 ENCOUNTER — Non-Acute Institutional Stay (SKILLED_NURSING_FACILITY): Payer: Self-pay | Admitting: Orthopedic Surgery

## 2023-05-15 DIAGNOSIS — R41841 Cognitive communication deficit: Secondary | ICD-10-CM | POA: Diagnosis not present

## 2023-05-15 DIAGNOSIS — I480 Paroxysmal atrial fibrillation: Secondary | ICD-10-CM

## 2023-05-15 DIAGNOSIS — R269 Unspecified abnormalities of gait and mobility: Secondary | ICD-10-CM | POA: Diagnosis not present

## 2023-05-15 DIAGNOSIS — Z87898 Personal history of other specified conditions: Secondary | ICD-10-CM | POA: Diagnosis not present

## 2023-05-15 DIAGNOSIS — M6281 Muscle weakness (generalized): Secondary | ICD-10-CM | POA: Diagnosis not present

## 2023-05-15 DIAGNOSIS — I639 Cerebral infarction, unspecified: Secondary | ICD-10-CM

## 2023-05-15 DIAGNOSIS — Z8781 Personal history of (healed) traumatic fracture: Secondary | ICD-10-CM | POA: Diagnosis not present

## 2023-05-15 DIAGNOSIS — Z4789 Encounter for other orthopedic aftercare: Secondary | ICD-10-CM | POA: Diagnosis not present

## 2023-05-15 DIAGNOSIS — R413 Other amnesia: Secondary | ICD-10-CM

## 2023-05-15 DIAGNOSIS — S7291XA Unspecified fracture of right femur, initial encounter for closed fracture: Secondary | ICD-10-CM | POA: Diagnosis not present

## 2023-05-15 NOTE — Progress Notes (Signed)
 Location:  [facility] Room Number: 9/A Place of Service:  SNF [phone]) Provider:  Octavia Heir, NP   Mahlon Gammon, MD  Patient Care Team: Mahlon Gammon, MD as PCP - General (Internal Medicine) Wendall Stade, MD as PCP - Cardiology (Cardiology) Mckinley Jewel, MD as Consulting Physician (Ophthalmology) Mitzi Davenport, Minerva Fester, MD as Referring Physician (Ophthalmology) Huston Foley, MD as Consulting Physician (Neurology) Erroll Luna, [facility] (Inactive) as Pharmacist (Pharmacist) Mahlon Gammon, MD as Consulting Physician (Internal Medicine)  Extended Emergency Contact Information Primary Emergency Contact: McGinnis,Eileen Mobile Phone: [phone] Relation: Other Secondary Emergency Contact: Heyge,Lorna Mobile Phone: [phone] Relation: Friend Interpreter needed? No  Code Status:  DNR Goals of care: Advanced Directive information    05/14/2023    1:48 PM  Advanced Directives  Does Patient Have a Medical Advance Directive? No     Chief Complaint  Patient presents with   Hospitalization Follow-up    HPI:  Pt is a 89 y.o. female seen today for f/u s/p ED evaluation due to left sided weakness and aphasia.   She currently resides on the skilled nursing unit at [facility]. PMH: left MCA stroke 2015, TIA, atrial fib, MVR, HTN, HLD, Raynaud's, hypothyroidism, macular degeneration, GERD, CKD, and depression.   H/o left MCA stroke 2015 and 01/20 embolic stroke involving left MCA/PCA, right MCA/ANA punctuate. She was switched to Eliquis after hospitalization. 02/27 presented with aphasia and left sided weakness. Code stroke was initiated. CT head no evidence of acute intracranial abnormality. MRI brain no acute intracranial abnormality. EEG was negative. Neurology recommended Vimpat BID and outpatient follow up. Her symptoms improved during her evaluation and she was discharged back to [facility].   Today, she does not recall ED  visit. Her speech is normal. Left sided weakness has resolved. She continues to ambulate with a walker. Her husband tested positive for covid earlier this week. She denies cold symptoms at this time. Nursing did a covid test which was negative. Afebrile. Vitals stable.    Past Medical History:  Diagnosis Date   Atrial fibrillation (HCC)    AFIB   Collagenous colitis    CVA (cerebral vascular accident) (HCC)    a.  L parietal CVA by MRI 12/12, likely embolic;  b.  TEE by report with neg bubble study;  c.  carotid dopplers  12/12:  + plaque, no sig ICA stenosis    History of postmenopausal HRT    Hx of adenomatous colonic polyps    Hypothyroidism    MVP (mitral valve prolapse)    a. s/p MV repair at [facility] in 2008;  b. Echocardiogram 7/12: EF 50%, status post mitral valve repair with mild MR and minimal MS, mean gradient 4, moderate LAE, LA diam 55 mm; mild RAE, PASP 28-32;    Osteoarthritis    Osteoporosis    Past Surgical History:  Procedure Laterality Date   ANTERIOR APPROACH HEMI HIP ARTHROPLASTY Right 03/05/2023   Procedure: ANTERIOR APPROACH HEMI HIP ARTHROPLASTY;  Surgeon: Samson Frederic, MD;  Location: [facility] ORS;  Service: Orthopedics;  Laterality: Right;   CARDIOVERSION  01/09/2012   Procedure: CARDIOVERSION;  Surgeon: Dolores Patty, MD;  Location: [facility] ENDOSCOPY;  Service: Cardiovascular;  Laterality: N/A;   CATARACT EXTRACTION     CHOLECYSTECTOMY     DILATION AND CURETTAGE OF UTERUS     LUMBAR FUSION     MITRAL VALVE REPAIR  03/17/2006   "mitral valve repair CE ring and  maze procedure"   TONSILLECTOMY      Allergies  Allergen Reactions   Tape Other (See Comments)    Skin tears and bruises easily.   Vioxx [Rofecoxib] Other (See Comments)    Elevated LFT's    Outpatient Encounter Medications as of 05/15/2023  Medication Sig   amoxicillin (AMOXIL) 500 MG capsule Take 4 capsules (2,000 mg total) by mouth as needed (1 hour before dental procedure, has history of MVR).  (Patient not taking: Reported on 05/14/2023)   apixaban (ELIQUIS) 2.5 MG TABS tablet Take 1 tablet (2.5 mg total) by mouth 2 (two) times daily.   Calcium Citrate-Vitamin D (CITRACAL PETITES/VITAMIN D) 200-6.25 MG-MCG TABS Take 1 tablet by mouth 2 (two) times daily.   carbamide peroxide (DEBROX) 6.5 % OTIC solution Place 5 drops into both ears at bedtime for 5 days.   docusate sodium (COLACE) 100 MG capsule Take 1 capsule (100 mg total) by mouth 2 (two) times daily.   escitalopram (LEXAPRO) 20 MG tablet Take 20 mg by mouth daily.   fluticasone (FLONASE) 50 MCG/ACT nasal spray Place 2 sprays into both nostrils daily.   glucosamine-chondroitin 500-400 MG tablet Take 1 tablet by mouth 2 (two) times daily.   lacosamide (VIMPAT) 50 MG TABS tablet Take 1 tablet (50 mg total) by mouth 2 (two) times daily.   lactose free nutrition (BOOST) LIQD Take 237 mLs by mouth 2 (two) times daily between meals.   levothyroxine (SYNTHROID) 75 MCG tablet TAKE 1 TABLET BY MOUTH EVERY DAY BEFORE BREAKFAST   loratadine (CLARITIN) 10 MG tablet Take 1 tablet (10 mg total) by mouth daily.   metoprolol tartrate (LOPRESSOR) 25 MG tablet Take 1 tablet (25 mg total) by mouth 2 (two) times daily.   Multiple Vitamins-Minerals (PRESERVISION AREDS 2 PO) Take 1 capsule by mouth 2 (two) times daily. Reported on 09/04/2015   polyethylene glycol (MIRALAX / GLYCOLAX) 17 g packet Take 17 g by mouth daily as needed for mild constipation.   rosuvastatin (CRESTOR) 10 MG tablet TAKE 1 TABLET BY MOUTH DAILY. GENERIC EQUIVALENT FOR CRESTOR (Patient taking differently: Take 10 mg by mouth daily at 6 PM.)   Saline (SIMPLY SALINE) 0.9 % AERS Place 2 each into the nose daily as needed.   TYLENOL 8 HOUR ARTHRITIS PAIN 650 MG CR tablet Take 650 mg by mouth daily.   vitamin A 16109 UNIT capsule Take 1 capsule by mouth daily.   zinc oxide 20 % ointment Apply 1 Application topically See admin instructions. Apply topically to peri/buttocks as needed after  each incontinent episode for skin protection.   No facility-administered encounter medications on file as of 05/15/2023.    Review of Systems  Unable to perform ROS: Dementia    Immunization History  Administered Date(s) Administered   Fluad Quad(high Dose 65+) 11/30/2018, 01/17/2020   Fluad Trivalent(High Dose 65+) 03/02/2023   Influenza Split 12/17/2010, 12/02/2011   Influenza Whole 03/17/2001, 12/18/2006, 12/28/2007, 01/03/2009, 12/26/2009   Influenza, High Dose Seasonal PF 12/22/2012, 03/06/2015, 11/26/2015, 12/09/2017   Influenza,inj,Quad PF,6+ Mos 11/23/2013   Influenza-Unspecified 11/20/2016   PFIZER(Purple Top)SARS-COV-2 Vaccination 06/09/2019, 06/30/2019, 02/16/2020   Pneumococcal Conjugate-13 03/02/2014   Pneumococcal Polysaccharide-23 03/17/2000, 09/16/2006   Td 03/18/1995, 09/15/2006, 02/22/2021   Tdap 12/17/2010   Unspecified SARS-COV-2 Vaccination 12/08/2020   Zoster Recombinant(Shingrix) 09/18/2016, 11/20/2016   Pertinent  Health Maintenance Due  Topic Date Due   INFLUENZA VACCINE  Completed   DEXA SCAN  Completed      12/12/2020    3:12  PM 11/25/2021   10:46 AM 02/11/2022   11:26 AM 05/26/2022   11:09 AM 03/02/2023    3:17 PM  Fall Risk  Falls in the past year? 0 0 1 1 1   Was there an injury with Fall? 0 0 1 1 0  Was there an injury with Fall? - Comments   fractured rib    Fall Risk Category Calculator 0 0 3 3 2   Fall Risk Category (Retired) Low Low High    (RETIRED) Patient Fall Risk Level Low fall risk Low fall risk Low fall risk    Patient at Risk for Falls Due to No Fall Risks No Fall Risks No Fall Risks History of fall(s) Impaired balance/gait;Impaired mobility;History of fall(s)  Fall risk Follow up  Falls evaluation completed Falls evaluation completed Falls evaluation completed Falls prevention discussed   Functional Status Survey:    Vitals:   05/15/23 1300  BP: 117/69  Pulse: 82  Resp: 17  Temp: 97.7 F (36.5 C)  SpO2: 96%  Weight: 96  lb 1.6 oz (43.6 kg)  Height: 5' (1.524 m)   Body mass index is 18.77 kg/m. Physical Exam Vitals reviewed.  Constitutional:      General: She is not in acute distress. HENT:     Head: Normocephalic.     Nose: Nose normal.     Mouth/Throat:     Mouth: Mucous membranes are moist.     Pharynx: No posterior oropharyngeal erythema.  Eyes:     General:        Right eye: No discharge.        Left eye: No discharge.  Cardiovascular:     Rate and Rhythm: Normal rate. Rhythm irregular.     Pulses: Normal pulses.     Heart sounds: Normal heart sounds.  Pulmonary:     Effort: Pulmonary effort is normal. No respiratory distress.     Breath sounds: Normal breath sounds. No wheezing or rales.  Abdominal:     General: Bowel sounds are normal.     Palpations: Abdomen is soft.  Musculoskeletal:     Cervical back: Neck supple.     Right lower leg: No edema.     Left lower leg: No edema.     Comments: Able to move extremities without difficulty  Skin:    General: Skin is warm.     Capillary Refill: Capillary refill takes less than 2 seconds.  Neurological:     General: No focal deficit present.     Mental Status: She is alert and oriented to person, place, and time.     Motor: Weakness present.     Gait: Gait abnormal.     Comments: Speech clear, smile symmetrical, hand grips equal  Psychiatric:        Mood and Affect: Mood normal.     Labs reviewed: Recent Labs    03/06/23 0337 03/07/23 0352 03/08/23 0352 04/06/23 1326 04/08/23 0438 04/08/23 0439 05/14/23 0948 05/14/23 0954  NA 130* 133* 139 137 140  --  139 139  K 4.0 4.6 4.4 5.0 3.6  --  4.3 4.3  CL 97* 98 103 101 105  --  102 103  CO2 26 29 30 26 23   --  26  --   GLUCOSE 154* 131* 95 98 89  --  110* 103*  BUN 25* 34* 27* 25* 24*  --  21 22  CREATININE 0.93 0.89 0.64 0.94 0.89  --  0.93 1.00  CALCIUM 7.7* 8.5*  8.6* 9.3 8.6*  --  9.3  --   MG  --  2.1 2.2  --   --  2.0  --   --   PHOS 3.1 1.6* 3.1  --   --   --   --    --    Recent Labs    03/03/23 2200 03/05/23 0748 03/08/23 0352 04/06/23 1326 05/14/23 0948  AST 29  --   --  33 29  ALT 42  --   --  28 25  ALKPHOS 81  --   --  159* 127*  BILITOT 0.7  --   --  0.7 0.9  PROT 5.9*  --   --  6.2* 6.5  ALBUMIN 3.6   < > 2.8* 3.6 3.8   < > = values in this interval not displayed.   Recent Labs    03/03/23 2124 03/05/23 0748 03/08/23 0352 04/06/23 1326 05/14/23 0948 05/14/23 0954  WBC 10.9*   < > 9.5 10.5 9.3  --   NEUTROABS 9.0*  --   --  8.0* 7.2  --   HGB 13.4   < > 11.4* 11.9* 12.7 12.9  HCT 41.0   < > 34.7* 37.9 39.8 38.0  MCV 102.2*   < > 99.7 101.6* 97.8  --   PLT 150   < > 167 191 173  --    < > = values in this interval not displayed.   Lab Results  Component Value Date   TSH 1.862 04/08/2023   Lab Results  Component Value Date   HGBA1C 5.5 03/06/2023   Lab Results  Component Value Date   CHOL 132 04/07/2023   HDL 66 04/07/2023   LDLCALC 57 04/07/2023   LDLDIRECT 47.0 09/04/2015   TRIG 43 04/07/2023   CHOLHDL 2.0 04/07/2023    Significant Diagnostic Results in last 30 days:  EEG adult Result Date: 05/14/2023 Charlsie Quest, MD     05/14/2023  3:11 PM Patient Name: Theresa [name] MRN: [id number] Epilepsy Attending: Charlsie Quest Referring Physician/Provider: Gordy Councilman, MD Date: 05/14/2023 Duration: 23.38 mins Patient history: 89yo F with an episode of aphasia and left sided weakness. EEG to evaluate for seizure Level of alertness: Awake AEDs during EEG study: None Technical aspects: This EEG study was done with scalp electrodes positioned according to the 10-20 International system of electrode placement. Electrical activity was reviewed with band pass filter of 1-70Hz , sensitivity of 7 uV/mm, display speed of 32mm/sec with a 60Hz  notched filter applied as appropriate. EEG data were recorded continuously and digitally stored.  Video monitoring was available and reviewed as appropriate. Description: The posterior  dominant rhythm consists of 8 Hz activity of moderate voltage (25-35 uV) seen predominantly in posterior head regions, symmetric and reactive to eye opening and eye closing. EEG showed intermittent 3 to 6 Hz theta-delta slowing in left fronto-temporal region. Hyperventilation and photic stimulation were not performed.   ABNORMALITY - Intermittent slow, left fronto-temporal region IMPRESSION: This study is suggestive of cortical dysfunction arising from left  fronto-temporal region. No seizures or epileptiform discharges were seen throughout the recording. Please note lack of epileptiform activity during interictal EEG does not exclude the diagnosis of epilepsy. Charlsie Quest   MR BRAIN WO CONTRAST Result Date: 05/14/2023 CLINICAL DATA:  Stroke EXAM: MRI HEAD WITHOUT CONTRAST TECHNIQUE: Multiplanar, multiecho pulse sequences of the brain and surrounding structures were obtained without intravenous contrast. COMPARISON:  Same-day head CT.  MRI head 04/06/2023.  FINDINGS: Brain: No acute infarct. No evidence of intracranial hemorrhage. FLAIR hyperintensity in the periventricular and subcortical white matter suggestive of chronic microvascular ischemic changes. Encephalomalacia and gliosis in the left frontal operculum compatible with remote infarct. Remote lacunar infarcts in the bilateral basal ganglia and additional small remote infarcts in the bilateral cerebellar hemispheres. No mass lesion or midline shift. Cerebellum is unremarkable. Normal appearance of midline structures. The basilar cisterns are patent. No extra-axial fluid collections. Ventricles: Prominence of the lateral ventricles suggestive of underlying parenchymal volume loss. Vascular: Skull base flow voids are visualized. Skull and upper cervical spine: No focal abnormality. Sinuses/Orbits: Orbits are symmetric. Paranasal sinuses are clear. Other: Mastoid air cells are clear. IMPRESSION: 1. No acute intracranial abnormality. 2. Chronic  microvascular ischemic changes and parenchymal volume loss. 3. Remote infarct in the left frontal operculum. Additional small remote infarcts in the bilateral basal ganglia and cerebellum. Electronically Signed   By: Emily Filbert M.D.   On: 05/14/2023 11:50   CT HEAD CODE STROKE WO CONTRAST Result Date: 05/14/2023 CLINICAL DATA:  Code stroke.  Neuro deficit. EXAM: CT HEAD WITHOUT CONTRAST TECHNIQUE: Contiguous axial images were obtained from the base of the skull through the vertex without intravenous contrast. RADIATION DOSE REDUCTION: This exam was performed according to the departmental dose-optimization program which includes automated exposure control, adjustment of the mA and/or kV according to patient size and/or use of iterative reconstruction technique. COMPARISON:  MRI head 04/06/2023 FINDINGS: Brain: No acute intracranial hemorrhage. No CT evidence of acute infarct. Encephalomalacia in the left frontal operculum compatible with remote infarct. Nonspecific hypoattenuation in the periventricular and subcortical white matter favored to reflect chronic microvascular ischemic changes. Small remote infarcts in the bilateral cerebellum. No edema, mass effect, or midline shift. The basilar cisterns are patent. Ventricles: The ventricles are normal. Vascular: Atherosclerosis of the carotid siphons. No hyperdense vessel. Skull: No acute or aggressive finding. Orbits: Orbits are symmetric. Sinuses: The visualized paranasal sinuses are clear. Other: Mastoid air cells are clear. ASPECTS [facility] Stroke Program Early CT Score) - Ganglionic level infarction (caudate, lentiform nuclei, internal capsule, insula, M1-M3 cortex): 7 - Supraganglionic infarction (M4-M6 cortex): 3 Total score (0-10 with 10 being normal): 10 IMPRESSION: 1. No CT evidence of acute intracranial abnormality. 2. Redemonstrated remote infarct involving the left frontal operculum. Additional small remote infarcts in the bilateral cerebellum. 3.  ASPECTS is 10 These results were communicated to Dr. Iver Nestle At 10:06 am on 05/14/2023 by text page via the [facility] messaging system. Electronically Signed   By: Emily Filbert M.D.   On: 05/14/2023 10:07    Assessment/Plan 1. History of seizure (Primary) - 02/27 left sided weakness and aphasia> code stroke - EEG negative - neurology recommended Vimpat - outpatient f/u with neuro> scheduled 03/05  2. Acute embolic stroke (HCC) - 02/27 left sided weakness and aphasia> code stroke -  left MCA stroke 2015 - 01/20 embolic stroke ( involved left and right MCA/PCA) > slurred speech and confusion presentation - exam unremarkable today, but forgetful - Xarelto switched to Eliquis - cont rosuvastatin  3. S/P right hip fracture - 12/19 right hip hemiarthroplasty - WBAT - pain controlled without medications - cont PT  4. Memory impairment - recent MRI brain noted chronic microvascular ischemic changes  - more forgetful  - will discuss cognitive testing with ST  5. Paroxysmal atrial fibrillation (HCC) - HR< 100 with metoprolol - now on Eliquis for clot prevention    Family/ staff Communication: plan discussed with patient and  nurse  Labs/tests ordered:  none

## 2023-05-17 ENCOUNTER — Telehealth: Payer: Self-pay | Admitting: Family

## 2023-05-17 NOTE — Telephone Encounter (Signed)
 Facility Nurse called states patient was started recently on monulpiravir request provider to call [facility] in [place] at [phone] to obtain Prior Authorization.called pharmacist states monulpiravir not covered by patient's insurance will cost $ 1100 no prior authorization required.patient not candidate for Paxlovid since patient is on low dose of Eliquis 2.5 mg tablet twice daily.Monulpiravir discontinue.patient to continue on mucinex,vitamin C D and Zinc.Please follow up.

## 2023-05-18 ENCOUNTER — Telehealth: Payer: Self-pay | Admitting: Nurse Practitioner

## 2023-05-18 ENCOUNTER — Non-Acute Institutional Stay (SKILLED_NURSING_FACILITY): Payer: Self-pay | Admitting: Orthopedic Surgery

## 2023-05-18 ENCOUNTER — Encounter: Payer: Self-pay | Admitting: Orthopedic Surgery

## 2023-05-18 ENCOUNTER — Telehealth: Payer: Self-pay | Admitting: Neurology

## 2023-05-18 DIAGNOSIS — R269 Unspecified abnormalities of gait and mobility: Secondary | ICD-10-CM | POA: Diagnosis not present

## 2023-05-18 DIAGNOSIS — M6281 Muscle weakness (generalized): Secondary | ICD-10-CM | POA: Diagnosis not present

## 2023-05-18 DIAGNOSIS — S7291XA Unspecified fracture of right femur, initial encounter for closed fracture: Secondary | ICD-10-CM | POA: Diagnosis not present

## 2023-05-18 DIAGNOSIS — R41841 Cognitive communication deficit: Secondary | ICD-10-CM | POA: Diagnosis not present

## 2023-05-18 DIAGNOSIS — R413 Other amnesia: Secondary | ICD-10-CM | POA: Diagnosis not present

## 2023-05-18 DIAGNOSIS — U071 COVID-19: Secondary | ICD-10-CM | POA: Diagnosis not present

## 2023-05-18 DIAGNOSIS — Z4789 Encounter for other orthopedic aftercare: Secondary | ICD-10-CM | POA: Diagnosis not present

## 2023-05-18 DIAGNOSIS — Z9181 History of falling: Secondary | ICD-10-CM | POA: Diagnosis not present

## 2023-05-18 MED ORDER — VITAMIN C 1000 MG PO TABS: 1000.0000 mg | ORAL_TABLET | Freq: Every day | ORAL | Status: AC

## 2023-05-18 MED ORDER — ZINC GLUCONATE 50 MG PO TABS: 50.0000 mg | ORAL_TABLET | Freq: Every day | ORAL | Status: AC

## 2023-05-18 MED ORDER — VITAMIN D3 50 MCG (2000 UT) PO CAPS: 2000.0000 [IU] | ORAL_CAPSULE | Freq: Every day | ORAL | Status: AC

## 2023-05-18 NOTE — Progress Notes (Signed)
 Location:  [facility] Room Number: 9-A Place of Service:  SNF [id number]) Provider:  Octavia Heir, NP   Mahlon Gammon, MD  Patient Care Team: Mahlon Gammon, MD as PCP - General (Internal Medicine) Wendall Stade, MD as PCP - Cardiology (Cardiology) Mckinley Jewel, MD as Consulting Physician (Ophthalmology) Mitzi Davenport, Minerva Fester, MD as Referring Physician (Ophthalmology) Huston Foley, MD as Consulting Physician (Neurology) Erroll Luna, [facility] (Inactive) as Pharmacist (Pharmacist) Mahlon Gammon, MD as Consulting Physician (Internal Medicine)  Extended Emergency Contact Information Primary Emergency Contact: McGinnis,Eileen Mobile Phone: [phone] Relation: Other Secondary Emergency Contact: Heyge,Lorna Mobile Phone: [phone] Relation: Friend Interpreter needed? No  Code Status:  DNR Goals of care: Advanced Directive information    05/14/2023    1:48 PM  Advanced Directives  Does Patient Have a Medical Advance Directive? No     Chief Complaint  Patient presents with   Acute Visit    Covid-19    HPI:  Pt is a 89 y.o. female seen today for acute visit due to covid.   She currently resides on the skilled nursing unit at [facility]. PMH: left MCA stroke 2015, TIA, atrial fib, MVR, HTN, HLD, Raynaud's, hypothyroidism, macular degeneration, GERD, CKD, and depression.   03/02 she tested positive for covid. She recently visited ED due to left sided weakness and husband also tested positive last week. Symptoms include: sore throat, nasal congestion, dry cough and fatigue. On call provider ordered molnupiravir but cost is $1,100. She would like to try supplements instead. Afebrile. Vitals stable.    Past Medical History:  Diagnosis Date   Atrial fibrillation (HCC)    AFIB   Collagenous colitis    CVA (cerebral vascular accident) (HCC)    a.  L parietal CVA by MRI 12/12, likely embolic;  b.  TEE by report with neg bubble study;  c.   carotid dopplers  12/12:  + plaque, no sig ICA stenosis    History of postmenopausal HRT    Hx of adenomatous colonic polyps    Hypothyroidism    MVP (mitral valve prolapse)    a. s/p MV repair at [facility] in 2008;  b. Echocardiogram 7/12: EF 50%, status post mitral valve repair with mild MR and minimal MS, mean gradient 4, moderate LAE, LA diam 55 mm; mild RAE, PASP 28-32;    Osteoarthritis    Osteoporosis    Past Surgical History:  Procedure Laterality Date   ANTERIOR APPROACH HEMI HIP ARTHROPLASTY Right 03/05/2023   Procedure: ANTERIOR APPROACH HEMI HIP ARTHROPLASTY;  Surgeon: Samson Frederic, MD;  Location: [facility] ORS;  Service: Orthopedics;  Laterality: Right;   CARDIOVERSION  01/09/2012   Procedure: CARDIOVERSION;  Surgeon: Dolores Patty, MD;  Location: [facility] ENDOSCOPY;  Service: Cardiovascular;  Laterality: N/A;   CATARACT EXTRACTION     CHOLECYSTECTOMY     DILATION AND CURETTAGE OF UTERUS     LUMBAR FUSION     MITRAL VALVE REPAIR  03/17/2006   "mitral valve repair CE ring and maze procedure"   TONSILLECTOMY      Allergies  Allergen Reactions   Tape Other (See Comments)    Skin tears and bruises easily.   Vioxx [Rofecoxib] Other (See Comments)    Elevated LFT's    Outpatient Encounter Medications as of 05/18/2023  Medication Sig   amoxicillin (AMOXIL) 500 MG tablet Take 2,000 mg by mouth daily at 12 noon.   apixaban (ELIQUIS) 2.5 MG TABS tablet Take 1  tablet (2.5 mg total) by mouth 2 (two) times daily.   Calcium Citrate-Vitamin D (CITRACAL PETITES/VITAMIN D) 200-6.25 MG-MCG TABS Take 1 tablet by mouth 2 (two) times daily.   docusate sodium (COLACE) 100 MG capsule Take 1 capsule (100 mg total) by mouth 2 (two) times daily.   rosuvastatin (CRESTOR) 10 MG tablet TAKE 1 TABLET BY MOUTH DAILY. GENERIC EQUIVALENT FOR CRESTOR (Patient taking differently: Take 10 mg by mouth daily at 6 PM.)   escitalopram (LEXAPRO) 20 MG tablet Take 20 mg by mouth daily.   fluticasone (FLONASE) 50  MCG/ACT nasal spray Place 2 sprays into both nostrils daily.   glucosamine-chondroitin 500-400 MG tablet Take 1 tablet by mouth 2 (two) times daily.   lacosamide (VIMPAT) 50 MG TABS tablet Take 1 tablet (50 mg total) by mouth 2 (two) times daily.   lactose free nutrition (BOOST) LIQD Take 237 mLs by mouth 2 (two) times daily between meals.   levothyroxine (SYNTHROID) 75 MCG tablet TAKE 1 TABLET BY MOUTH EVERY DAY BEFORE BREAKFAST   loratadine (CLARITIN) 10 MG tablet Take 1 tablet (10 mg total) by mouth daily.   metoprolol tartrate (LOPRESSOR) 25 MG tablet Take 1 tablet (25 mg total) by mouth 2 (two) times daily.   Multiple Vitamins-Minerals (PRESERVISION AREDS 2 PO) Take 1 capsule by mouth 2 (two) times daily. Reported on 09/04/2015   polyethylene glycol (MIRALAX / GLYCOLAX) 17 g packet Take 17 g by mouth daily as needed for mild constipation.   Saline (SIMPLY SALINE) 0.9 % AERS Place 2 each into the nose daily as needed.   TYLENOL 8 HOUR ARTHRITIS PAIN 650 MG CR tablet Take 650 mg by mouth daily.   vitamin A 16109 UNIT capsule Take 1 capsule by mouth daily.   zinc oxide 20 % ointment Apply 1 Application topically See admin instructions. Apply topically to peri/buttocks as needed after each incontinent episode for skin protection.   [DISCONTINUED] amoxicillin (AMOXIL) 500 MG capsule Take 4 capsules (2,000 mg total) by mouth as needed (1 hour before dental procedure, has history of MVR).   No facility-administered encounter medications on file as of 05/18/2023.    Review of Systems  Constitutional:  Positive for fatigue.  HENT:  Positive for congestion, rhinorrhea and sore throat.   Respiratory:  Positive for cough. Negative for shortness of breath and wheezing.   Cardiovascular:  Negative for chest pain and leg swelling.  Gastrointestinal:  Negative for diarrhea, nausea and vomiting.  Endocrine: Negative for cold intolerance and heat intolerance.  Musculoskeletal:  Negative for myalgias.   Neurological:  Negative for dizziness and headaches.  Psychiatric/Behavioral:  Positive for confusion and dysphoric mood. The patient is not nervous/anxious.     Immunization History  Administered Date(s) Administered   Fluad Quad(high Dose 65+) 11/30/2018, 01/17/2020   Fluad Trivalent(High Dose 65+) 03/02/2023   Influenza Split 12/17/2010, 12/02/2011   Influenza Whole 03/17/2001, 12/18/2006, 12/28/2007, 01/03/2009, 12/26/2009   Influenza, High Dose Seasonal PF 12/22/2012, 03/06/2015, 11/26/2015, 12/09/2017   Influenza,inj,Quad PF,6+ Mos 11/23/2013   Influenza-Unspecified 11/20/2016   PFIZER(Purple Top)SARS-COV-2 Vaccination 06/09/2019, 06/30/2019, 02/16/2020   Pneumococcal Conjugate-13 03/02/2014   Pneumococcal Polysaccharide-23 03/17/2000, 09/16/2006   Td 03/18/1995, 09/15/2006, 02/22/2021   Tdap 12/17/2010   Unspecified SARS-COV-2 Vaccination 12/08/2020   Zoster Recombinant(Shingrix) 09/18/2016, 11/20/2016   Pertinent  Health Maintenance Due  Topic Date Due   INFLUENZA VACCINE  Completed   DEXA SCAN  Completed      12/12/2020    3:12 PM 11/25/2021   10:46  AM 02/11/2022   11:26 AM 05/26/2022   11:09 AM 03/02/2023    3:17 PM  Fall Risk  Falls in the past year? 0 0 1 1 1   Was there an injury with Fall? 0 0 1 1 0  Was there an injury with Fall? - Comments   fractured rib    Fall Risk Category Calculator 0 0 3 3 2   Fall Risk Category (Retired) Low Low High    (RETIRED) Patient Fall Risk Level Low fall risk Low fall risk Low fall risk    Patient at Risk for Falls Due to No Fall Risks No Fall Risks No Fall Risks History of fall(s) Impaired balance/gait;Impaired mobility;History of fall(s)  Fall risk Follow up  Falls evaluation completed Falls evaluation completed Falls evaluation completed Falls prevention discussed   Functional Status Survey:    Vitals:   05/18/23 0916  BP: 117/69  Pulse: 82  Resp: 17  Temp: 97.7 F (36.5 C)  SpO2: 96%  Weight: 95 lb (43.1 kg)   Height: 5' (1.524 m)   Body mass index is 18.55 kg/m. Physical Exam Vitals reviewed.  Constitutional:      General: She is not in acute distress. HENT:     Head: Normocephalic.     Nose: Rhinorrhea present.     Mouth/Throat:     Mouth: Mucous membranes are moist.     Pharynx: No posterior oropharyngeal erythema.  Eyes:     General:        Right eye: No discharge.        Left eye: No discharge.  Cardiovascular:     Rate and Rhythm: Normal rate. Rhythm irregular.     Pulses: Normal pulses.     Heart sounds: Normal heart sounds.  Pulmonary:     Effort: Pulmonary effort is normal. No respiratory distress.     Breath sounds: Normal breath sounds. No wheezing or rales.  Abdominal:     General: Bowel sounds are normal.     Palpations: Abdomen is soft.  Musculoskeletal:     Cervical back: Neck supple.     Right lower leg: No edema.     Left lower leg: No edema.  Lymphadenopathy:     Cervical: No cervical adenopathy.  Skin:    General: Skin is warm.     Capillary Refill: Capillary refill takes less than 2 seconds.  Neurological:     General: No focal deficit present.     Mental Status: She is alert and oriented to person, place, and time.     Motor: Weakness present.     Gait: Gait abnormal.  Psychiatric:        Mood and Affect: Mood normal.     Labs reviewed: Recent Labs    03/06/23 0337 03/07/23 0352 03/08/23 0352 04/06/23 1326 04/08/23 0438 04/08/23 0439 05/14/23 0948 05/14/23 0954  NA 130* 133* 139 137 140  --  139 139  K 4.0 4.6 4.4 5.0 3.6  --  4.3 4.3  CL 97* 98 103 101 105  --  102 103  CO2 26 29 30 26 23   --  26  --   GLUCOSE 154* 131* 95 98 89  --  110* 103*  BUN 25* 34* 27* 25* 24*  --  21 22  CREATININE 0.93 0.89 0.64 0.94 0.89  --  0.93 1.00  CALCIUM 7.7* 8.5* 8.6* 9.3 8.6*  --  9.3  --   MG  --  2.1 2.2  --   --  2.0  --   --   PHOS 3.1 1.6* 3.1  --   --   --   --   --    Recent Labs    03/03/23 2200 03/05/23 0748 03/08/23 0352  04/06/23 1326 05/14/23 0948  AST 29  --   --  33 29  ALT 42  --   --  28 25  ALKPHOS 81  --   --  159* 127*  BILITOT 0.7  --   --  0.7 0.9  PROT 5.9*  --   --  6.2* 6.5  ALBUMIN 3.6   < > 2.8* 3.6 3.8   < > = values in this interval not displayed.   Recent Labs    03/03/23 2124 03/05/23 0748 03/08/23 0352 04/06/23 1326 05/14/23 0948 05/14/23 0954  WBC 10.9*   < > 9.5 10.5 9.3  --   NEUTROABS 9.0*  --   --  8.0* 7.2  --   HGB 13.4   < > 11.4* 11.9* 12.7 12.9  HCT 41.0   < > 34.7* 37.9 39.8 38.0  MCV 102.2*   < > 99.7 101.6* 97.8  --   PLT 150   < > 167 191 173  --    < > = values in this interval not displayed.   Lab Results  Component Value Date   TSH 1.862 04/08/2023   Lab Results  Component Value Date   HGBA1C 5.5 03/06/2023   Lab Results  Component Value Date   CHOL 132 04/07/2023   HDL 66 04/07/2023   LDLCALC 57 04/07/2023   LDLDIRECT 47.0 09/04/2015   TRIG 43 04/07/2023   CHOLHDL 2.0 04/07/2023    Significant Diagnostic Results in last 30 days:  EEG adult Result Date: 05/14/2023 Charlsie Quest, MD     05/14/2023  3:11 PM Patient Name: Theresa [name] MRN: [id number] Epilepsy Attending: Charlsie Quest Referring Physician/Provider: Gordy Councilman, MD Date: 05/14/2023 Duration: 23.38 mins Patient history: 89yo F with an episode of aphasia and left sided weakness. EEG to evaluate for seizure Level of alertness: Awake AEDs during EEG study: None Technical aspects: This EEG study was done with scalp electrodes positioned according to the 10-20 International system of electrode placement. Electrical activity was reviewed with band pass filter of 1-70Hz , sensitivity of 7 uV/mm, display speed of 65mm/sec with a 60Hz  notched filter applied as appropriate. EEG data were recorded continuously and digitally stored.  Video monitoring was available and reviewed as appropriate. Description: The posterior dominant rhythm consists of 8 Hz activity of moderate voltage (25-35  uV) seen predominantly in posterior head regions, symmetric and reactive to eye opening and eye closing. EEG showed intermittent 3 to 6 Hz theta-delta slowing in left fronto-temporal region. Hyperventilation and photic stimulation were not performed.   ABNORMALITY - Intermittent slow, left fronto-temporal region IMPRESSION: This study is suggestive of cortical dysfunction arising from left  fronto-temporal region. No seizures or epileptiform discharges were seen throughout the recording. Please note lack of epileptiform activity during interictal EEG does not exclude the diagnosis of epilepsy. Charlsie Quest   MR BRAIN WO CONTRAST Result Date: 05/14/2023 CLINICAL DATA:  Stroke EXAM: MRI HEAD WITHOUT CONTRAST TECHNIQUE: Multiplanar, multiecho pulse sequences of the brain and surrounding structures were obtained without intravenous contrast. COMPARISON:  Same-day head CT.  MRI head 04/06/2023. FINDINGS: Brain: No acute infarct. No evidence of intracranial hemorrhage. FLAIR hyperintensity in the periventricular and subcortical white matter suggestive of chronic microvascular  ischemic changes. Encephalomalacia and gliosis in the left frontal operculum compatible with remote infarct. Remote lacunar infarcts in the bilateral basal ganglia and additional small remote infarcts in the bilateral cerebellar hemispheres. No mass lesion or midline shift. Cerebellum is unremarkable. Normal appearance of midline structures. The basilar cisterns are patent. No extra-axial fluid collections. Ventricles: Prominence of the lateral ventricles suggestive of underlying parenchymal volume loss. Vascular: Skull base flow voids are visualized. Skull and upper cervical spine: No focal abnormality. Sinuses/Orbits: Orbits are symmetric. Paranasal sinuses are clear. Other: Mastoid air cells are clear. IMPRESSION: 1. No acute intracranial abnormality. 2. Chronic microvascular ischemic changes and parenchymal volume loss. 3. Remote infarct  in the left frontal operculum. Additional small remote infarcts in the bilateral basal ganglia and cerebellum. Electronically Signed   By: Emily Filbert M.D.   On: 05/14/2023 11:50   CT HEAD CODE STROKE WO CONTRAST Result Date: 05/14/2023 CLINICAL DATA:  Code stroke.  Neuro deficit. EXAM: CT HEAD WITHOUT CONTRAST TECHNIQUE: Contiguous axial images were obtained from the base of the skull through the vertex without intravenous contrast. RADIATION DOSE REDUCTION: This exam was performed according to the departmental dose-optimization program which includes automated exposure control, adjustment of the mA and/or kV according to patient size and/or use of iterative reconstruction technique. COMPARISON:  MRI head 04/06/2023 FINDINGS: Brain: No acute intracranial hemorrhage. No CT evidence of acute infarct. Encephalomalacia in the left frontal operculum compatible with remote infarct. Nonspecific hypoattenuation in the periventricular and subcortical white matter favored to reflect chronic microvascular ischemic changes. Small remote infarcts in the bilateral cerebellum. No edema, mass effect, or midline shift. The basilar cisterns are patent. Ventricles: The ventricles are normal. Vascular: Atherosclerosis of the carotid siphons. No hyperdense vessel. Skull: No acute or aggressive finding. Orbits: Orbits are symmetric. Sinuses: The visualized paranasal sinuses are clear. Other: Mastoid air cells are clear. ASPECTS [facility] Stroke Program Early CT Score) - Ganglionic level infarction (caudate, lentiform nuclei, internal capsule, insula, M1-M3 cortex): 7 - Supraganglionic infarction (M4-M6 cortex): 3 Total score (0-10 with 10 being normal): 10 IMPRESSION: 1. No CT evidence of acute intracranial abnormality. 2. Redemonstrated remote infarct involving the left frontal operculum. Additional small remote infarcts in the bilateral cerebellum. 3. ASPECTS is 10 These results were communicated to Dr. Iver Nestle At 10:06 am on  05/14/2023 by text page via the [facility] messaging system. Electronically Signed   By: Emily Filbert M.D.   On: 05/14/2023 10:07    Assessment/Plan 1. COVID (Primary) - exposure from recent ED visit and husband - 03/02 tested positive - symptoms: sore throat, nasal congestion, dry cough and fatigue - does not want to pay $1,100 for molnupiravir  - lung sounds clear, afebrile - will start supplements instead - Ascorbic Acid (VITAMIN C) 1000 MG tablet; Take 1 tablet (1,000 mg total) by mouth daily for 7 days. - zinc gluconate 50 MG tablet; Take 1 tablet (50 mg total) by mouth daily for 7 days. - Cholecalciferol (VITAMIN D3) 50 MCG (2000 UT) capsule; Take 1 capsule (2,000 Units total) by mouth daily for 7 days.  2. Memory impairment - improved since last encounter on 02/28 - no behaviors - recent MRI brain noted chronic microvascular ischemic changes  - ST plans to repeat MMSE within next 2 weeks    Family/ staff Communication: plan discussed with patient and nurse  Labs/tests ordered:  none

## 2023-05-18 NOTE — Telephone Encounter (Signed)
 [facility] called to reschedule patient's appointment due to patient has Covid

## 2023-05-18 NOTE — Telephone Encounter (Signed)
 Noted for positive COVID test on 3/1, notified while on call Started on molnupiravir due to being on eliquis

## 2023-05-18 NOTE — Telephone Encounter (Signed)
 Thank you for the update!

## 2023-05-18 NOTE — Addendum Note (Signed)
 Addended by: Meda Klinefelter E on: 05/18/2023 02:55 PM   Modules accepted: Orders

## 2023-05-19 ENCOUNTER — Encounter: Payer: Self-pay | Admitting: Orthopedic Surgery

## 2023-05-19 DIAGNOSIS — Z4789 Encounter for other orthopedic aftercare: Secondary | ICD-10-CM | POA: Diagnosis not present

## 2023-05-19 DIAGNOSIS — G3184 Mild cognitive impairment, so stated: Secondary | ICD-10-CM | POA: Insufficient documentation

## 2023-05-19 DIAGNOSIS — Z9181 History of falling: Secondary | ICD-10-CM | POA: Diagnosis not present

## 2023-05-19 DIAGNOSIS — R269 Unspecified abnormalities of gait and mobility: Secondary | ICD-10-CM | POA: Diagnosis not present

## 2023-05-19 DIAGNOSIS — M6281 Muscle weakness (generalized): Secondary | ICD-10-CM | POA: Diagnosis not present

## 2023-05-20 ENCOUNTER — Inpatient Hospital Stay: Payer: Medicare Other | Admitting: Adult Health

## 2023-05-20 DIAGNOSIS — Z9181 History of falling: Secondary | ICD-10-CM | POA: Diagnosis not present

## 2023-05-20 DIAGNOSIS — R269 Unspecified abnormalities of gait and mobility: Secondary | ICD-10-CM | POA: Diagnosis not present

## 2023-05-20 DIAGNOSIS — Z4789 Encounter for other orthopedic aftercare: Secondary | ICD-10-CM | POA: Diagnosis not present

## 2023-05-20 DIAGNOSIS — M6281 Muscle weakness (generalized): Secondary | ICD-10-CM | POA: Diagnosis not present

## 2023-05-21 DIAGNOSIS — S7291XA Unspecified fracture of right femur, initial encounter for closed fracture: Secondary | ICD-10-CM | POA: Diagnosis not present

## 2023-05-21 DIAGNOSIS — R41841 Cognitive communication deficit: Secondary | ICD-10-CM | POA: Diagnosis not present

## 2023-05-22 DIAGNOSIS — Z9181 History of falling: Secondary | ICD-10-CM | POA: Diagnosis not present

## 2023-05-22 DIAGNOSIS — S7291XA Unspecified fracture of right femur, initial encounter for closed fracture: Secondary | ICD-10-CM | POA: Diagnosis not present

## 2023-05-22 DIAGNOSIS — Z4789 Encounter for other orthopedic aftercare: Secondary | ICD-10-CM | POA: Diagnosis not present

## 2023-05-22 DIAGNOSIS — R41841 Cognitive communication deficit: Secondary | ICD-10-CM | POA: Diagnosis not present

## 2023-05-22 DIAGNOSIS — M6281 Muscle weakness (generalized): Secondary | ICD-10-CM | POA: Diagnosis not present

## 2023-05-25 DIAGNOSIS — M6281 Muscle weakness (generalized): Secondary | ICD-10-CM | POA: Diagnosis not present

## 2023-05-25 DIAGNOSIS — Z4789 Encounter for other orthopedic aftercare: Secondary | ICD-10-CM | POA: Diagnosis not present

## 2023-05-25 DIAGNOSIS — S7291XA Unspecified fracture of right femur, initial encounter for closed fracture: Secondary | ICD-10-CM | POA: Diagnosis not present

## 2023-05-25 DIAGNOSIS — R269 Unspecified abnormalities of gait and mobility: Secondary | ICD-10-CM | POA: Diagnosis not present

## 2023-05-25 DIAGNOSIS — R41841 Cognitive communication deficit: Secondary | ICD-10-CM | POA: Diagnosis not present

## 2023-05-26 DIAGNOSIS — M6281 Muscle weakness (generalized): Secondary | ICD-10-CM | POA: Diagnosis not present

## 2023-05-26 DIAGNOSIS — S7291XA Unspecified fracture of right femur, initial encounter for closed fracture: Secondary | ICD-10-CM | POA: Diagnosis not present

## 2023-05-26 DIAGNOSIS — Z9181 History of falling: Secondary | ICD-10-CM | POA: Diagnosis not present

## 2023-05-26 DIAGNOSIS — R41841 Cognitive communication deficit: Secondary | ICD-10-CM | POA: Diagnosis not present

## 2023-05-26 DIAGNOSIS — Z4789 Encounter for other orthopedic aftercare: Secondary | ICD-10-CM | POA: Diagnosis not present

## 2023-05-27 DIAGNOSIS — Z4789 Encounter for other orthopedic aftercare: Secondary | ICD-10-CM | POA: Diagnosis not present

## 2023-05-27 DIAGNOSIS — R269 Unspecified abnormalities of gait and mobility: Secondary | ICD-10-CM | POA: Diagnosis not present

## 2023-05-27 DIAGNOSIS — M6281 Muscle weakness (generalized): Secondary | ICD-10-CM | POA: Diagnosis not present

## 2023-05-28 DIAGNOSIS — S7291XA Unspecified fracture of right femur, initial encounter for closed fracture: Secondary | ICD-10-CM | POA: Diagnosis not present

## 2023-05-28 DIAGNOSIS — M6281 Muscle weakness (generalized): Secondary | ICD-10-CM | POA: Diagnosis not present

## 2023-05-28 DIAGNOSIS — R41841 Cognitive communication deficit: Secondary | ICD-10-CM | POA: Diagnosis not present

## 2023-05-28 DIAGNOSIS — Z9181 History of falling: Secondary | ICD-10-CM | POA: Diagnosis not present

## 2023-05-28 DIAGNOSIS — Z4789 Encounter for other orthopedic aftercare: Secondary | ICD-10-CM | POA: Diagnosis not present

## 2023-05-29 DIAGNOSIS — Z4789 Encounter for other orthopedic aftercare: Secondary | ICD-10-CM | POA: Diagnosis not present

## 2023-05-29 DIAGNOSIS — M6281 Muscle weakness (generalized): Secondary | ICD-10-CM | POA: Diagnosis not present

## 2023-05-29 DIAGNOSIS — R269 Unspecified abnormalities of gait and mobility: Secondary | ICD-10-CM | POA: Diagnosis not present

## 2023-06-01 DIAGNOSIS — R269 Unspecified abnormalities of gait and mobility: Secondary | ICD-10-CM | POA: Diagnosis not present

## 2023-06-01 DIAGNOSIS — M6281 Muscle weakness (generalized): Secondary | ICD-10-CM | POA: Diagnosis not present

## 2023-06-01 DIAGNOSIS — Z9181 History of falling: Secondary | ICD-10-CM | POA: Diagnosis not present

## 2023-06-01 DIAGNOSIS — Z4789 Encounter for other orthopedic aftercare: Secondary | ICD-10-CM | POA: Diagnosis not present

## 2023-06-02 DIAGNOSIS — R41841 Cognitive communication deficit: Secondary | ICD-10-CM | POA: Diagnosis not present

## 2023-06-02 DIAGNOSIS — Z4789 Encounter for other orthopedic aftercare: Secondary | ICD-10-CM | POA: Diagnosis not present

## 2023-06-02 DIAGNOSIS — M6281 Muscle weakness (generalized): Secondary | ICD-10-CM | POA: Diagnosis not present

## 2023-06-02 DIAGNOSIS — S7291XA Unspecified fracture of right femur, initial encounter for closed fracture: Secondary | ICD-10-CM | POA: Diagnosis not present

## 2023-06-02 DIAGNOSIS — Z9181 History of falling: Secondary | ICD-10-CM | POA: Diagnosis not present

## 2023-06-02 NOTE — Progress Notes (Unsigned)
 PATIENT: Theresa [name] DOB: 1931-04-03  REASON FOR VISIT: follow up HISTORY FROM: patient PRIMARY NEUROLOGIST: Dr. Frances Furbish  Chief Complaint  Patient presents with   Follow-up    Patient in room # and alone. Patient states she here to follow up with her stroke she had recently.     HISTORY OF PRESENT ILLNESS: Today 06/02/23  Theresa [name] is a 89 y.o. female who has been followed in this office for ***. Returns today for follow-up.   Word finding.  Left arm weakness prior to stroke Hip fx- using a walker now  Hospitalization- seizures vimapt   No trouble with memory. [facility] with husband was able to complete all ADLS but after hip she nees some help.  Finances does  No cooking eats at [facility] home.  Will be moving to assitive living.    MRI brain wo contrast: IMPRESSION:05/14/23 1. No acute intracranial abnormality. 2. Chronic microvascular ischemic changes and parenchymal volume loss. 3. Remote infarct in the left frontal operculum. Additional small remote infarcts in the bilateral basal ganglia and cerebellum.  CTA HEAD AND NECK:04/06/23   1. Negative CTA for large vessel occlusion or other emergent finding. 2. Mild for age atheromatous disease without hemodynamically significant stenosis. 3.  Aortic Atherosclerosis (ICD10-I70.0). 4. Small layering right pleural effusion.  MRI brain wo contrast 04/06/23: IMPRESSION: 1. Multiple small foci of restricted diffusion in the superior left cerebellar hemisphere, left occipital lobe, bilateral frontal lobes and right parietal lobe, consistent with acute infarcts, likely embolic. 2. Area of encephalomalacia and gliosis in the left frontoparietal region. 3. Multiple small chronic infarcts in the bilateral cerebellar hemispheres. 4. Moderate parenchymal volume loss.        HISTORY Theresa [name] is a 89 y.o. female with PMH significant for atrial fibrillation (on Xarelto at home),  hypertension, hyperlipidemia, left parietal stroke in 2012, hypothyroidism, MVR 2008, OA, recent right hip fracture status post Hemi hip arthroplasty December 2024 who presented to the ED 1/20 due to 3 episodes of transient confusion and speech difficulty. MRI brain reveals multifocal punctate acute ischemic strokes.  CTA negative for LVO. Patient endorsed compliance with Xarelto.  Stroke: left cerebellar, L MCA/PCA, R MCA/ACA punctate acute ischemic strokes Etiology: Likely due to atrial fibrillation with failure of Xarelto CTA head & neck  Negative CTA for large vessel occlusion or other emergent finding. Mild for age atheromatous disease without hemodynamically significant stenosis. Aortic atherosclerosis  MRI   Several punctate/small foci of restricted diffusion are seen: one in the superior left cerebellar hemisphere, one in the left occipital lobe, the bilateral frontal lobes and right parietal lobe, consistent with acute infarcts, likely embolic. Area of encephalomalacia and gliosis in the left frontoparietal region is also noted. Multiple small chronic infarcts in the bilateral cerebellar hemispheres. Moderate parenchymal volume loss.  VAS [place] LE DVT: Negative 2D Echo EF 55-60% LDL 57 HgbA1c 5.5 in 02/2023 VTE prophylaxis - Eliquis  Xarelto (rivaroxaban) daily prior to admission, switch to Eliquis 2.5 mg twice daily Therapy recommendations:  SNF Disposition: Pending   Hx of Stroke/TIA 02/2011 admitted for aphasia and right facial droop.  MRI: Left parietal infarct.  Changed from aspirin to Plavix, put on Lipitor.   Started on Coumadin around 05/2011 03/2013 admitted for transient left-sided weakness and slurring speech.  MRI negative.  Continue on Xarelto, and recommend to take with big meal      REVIEW OF SYSTEMS: Out of a complete 14 system review of symptoms, the  patient complains only of the following symptoms, and all other reviewed systems are negative.  ALLERGIES: Allergies   Allergen Reactions   Tape Other (See Comments)    Skin tears and bruises easily.   Vioxx [Rofecoxib] Other (See Comments)    Elevated LFT's    HOME MEDICATIONS: Outpatient Medications Prior to Visit  Medication Sig Dispense Refill   amoxicillin (AMOXIL) 500 MG tablet Take 2,000 mg by mouth daily at 12 noon.     apixaban (ELIQUIS) 2.5 MG TABS tablet Take 1 tablet (2.5 mg total) by mouth 2 (two) times daily. 60 tablet 0   Calcium Citrate-Vitamin D (CITRACAL PETITES/VITAMIN D) 200-6.25 MG-MCG TABS Take 1 tablet by mouth 2 (two) times daily.     docusate sodium (COLACE) 100 MG capsule Take 1 capsule (100 mg total) by mouth 2 (two) times daily.     escitalopram (LEXAPRO) 20 MG tablet Take 20 mg by mouth daily.     fluticasone (FLONASE) 50 MCG/ACT nasal spray Place 2 sprays into both nostrils daily. 16 g 6   glucosamine-chondroitin 500-400 MG tablet Take 1 tablet by mouth 2 (two) times daily.     guaiFENesin (MUCINEX) 600 MG 12 hr tablet Take 600 mg by mouth 2 (two) times daily.     lacosamide (VIMPAT) 50 MG TABS tablet Take 1 tablet (50 mg total) by mouth 2 (two) times daily. 60 tablet 2   lactose free nutrition (BOOST) LIQD Take 237 mLs by mouth 2 (two) times daily between meals.     levothyroxine (SYNTHROID) 75 MCG tablet TAKE 1 TABLET BY MOUTH EVERY DAY BEFORE BREAKFAST 90 tablet 3   loratadine (CLARITIN) 10 MG tablet Take 1 tablet (10 mg total) by mouth daily. 30 tablet 11   metoprolol tartrate (LOPRESSOR) 25 MG tablet Take 1 tablet (25 mg total) by mouth 2 (two) times daily.     Multiple Vitamins-Minerals (PRESERVISION AREDS 2 PO) Take 1 capsule by mouth 2 (two) times daily. Reported on 09/04/2015     polyethylene glycol (MIRALAX / GLYCOLAX) 17 g packet Take 17 g by mouth daily as needed for mild constipation.     rosuvastatin (CRESTOR) 10 MG tablet Take 10 mg by mouth 2 (two) times daily.     Saline (SIMPLY SALINE) 0.9 % AERS Place 2 each into the nose daily as needed. 500 mL 1   TYLENOL  8 HOUR ARTHRITIS PAIN 650 MG CR tablet Take 650 mg by mouth daily.     vitamin A 29528 UNIT capsule Take 1 capsule by mouth daily.     zinc oxide 20 % ointment Apply 1 Application topically See admin instructions. Apply topically to peri/buttocks as needed after each incontinent episode for skin protection.     No facility-administered medications prior to visit.    PAST MEDICAL HISTORY: Past Medical History:  Diagnosis Date   Atrial fibrillation (HCC)    AFIB   Collagenous colitis    CVA (cerebral vascular accident) (HCC)    a.  L parietal CVA by MRI 12/12, likely embolic;  b.  TEE by report with neg bubble study;  c.  carotid dopplers  12/12:  + plaque, no sig ICA stenosis    History of postmenopausal HRT    Hx of adenomatous colonic polyps    Hypothyroidism    MVP (mitral valve prolapse)    a. s/p MV repair at [facility] in 2008;  b. Echocardiogram 7/12: EF 50%, status post mitral valve repair with mild MR and minimal MS, mean  gradient 4, moderate LAE, LA diam 55 mm; mild RAE, PASP 28-32;    Osteoarthritis    Osteoporosis     PAST SURGICAL HISTORY: Past Surgical History:  Procedure Laterality Date   ANTERIOR APPROACH HEMI HIP ARTHROPLASTY Right 03/05/2023   Procedure: ANTERIOR APPROACH HEMI HIP ARTHROPLASTY;  Surgeon: Samson Frederic, MD;  Location: [facility] ORS;  Service: Orthopedics;  Laterality: Right;   CARDIOVERSION  01/09/2012   Procedure: CARDIOVERSION;  Surgeon: Dolores Patty, MD;  Location: [facility] ENDOSCOPY;  Service: Cardiovascular;  Laterality: N/A;   CATARACT EXTRACTION     CHOLECYSTECTOMY     DILATION AND CURETTAGE OF UTERUS     LUMBAR FUSION     MITRAL VALVE REPAIR  03/17/2006   "mitral valve repair CE ring and maze procedure"   TONSILLECTOMY      FAMILY HISTORY: Family History  Problem Relation Age of Onset   Cancer Father        COLON   Stroke Neg Hx        1ST DEGREE RELATIVE <60    SOCIAL HISTORY: Social History   Socioeconomic History   Marital status:  Married    Spouse name: Not on file   Number of children: Not on file   Years of education: Not on file   Highest education level: Master's degree (e.g., MA, MS, MEng, MEd, MSW, MBA)  Occupational History   Occupation: RETIRED    Employer: RETIRED    Comment: Psychiatric nurse  Tobacco Use   Smoking status: Former    Current packs/day: 0.00    Types: Cigarettes    Quit date: 04/28/1966    Years since quitting: 57.1   Smokeless tobacco: Never  Vaping Use   Vaping status: Never Used  Substance and Sexual Activity   Alcohol use: Yes    Alcohol/week: 7.0 standard drinks of alcohol    Types: 7 Glasses of wine per week    Comment: 1 small glass with luch and dinner   Drug use: No   Sexual activity: Not Currently  Other Topics Concern   Not on file  Social History Narrative   Married for 12 years-1st marriage for both. No kids. No pets.       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   Lived in [place] for 10 years prior (masters in music and music education)-undergrad at [employer]: eating out, opera in [place]   Social Drivers of Health   Financial Resource Strain: Low Risk  (03/02/2023)   Overall Financial Resource Strain (CARDIA)    Difficulty of Paying Living Expenses: Not hard at all  Food Insecurity: No Food Insecurity (04/07/2023)   Hunger Vital Sign    Worried About Running Out of Food in the Last Year: Never true    Ran Out of Food in the Last Year: Never true  Transportation Needs: No Transportation Needs (04/07/2023)   PRAPARE - Administrator, Civil Service (Medical): No    Lack of Transportation (Non-Medical): No  Physical Activity: Insufficiently Active (03/02/2023)   Exercise Vital Sign    Days of Exercise per Week: 5 days    Minutes of Exercise per Session: 20 min  Stress: No Stress Concern Present (03/02/2023)   [employer] of Occupational Health - Occupational Stress Questionnaire    Feeling of Stress : Not  at all  Social Connections: Moderately Isolated (04/07/2023)   Social Connection and Isolation Panel [  NHANES]    Frequency of Communication with Friends and Family: Once a week    Frequency of Social Gatherings with Friends and Family: More than three times a week    Attends Religious Services: Never    Database administrator or Organizations: No    Attends Banker Meetings: Never    Marital Status: Married  Catering manager Violence: Not At Risk (04/07/2023)   Humiliation, Afraid, Rape, and Kick questionnaire    Fear of Current or Ex-Partner: No    Emotionally Abused: No    Physically Abused: No    Sexually Abused: No      PHYSICAL EXAM  There were no vitals filed for this visit. There is no height or weight on file to calculate BMI.  Generalized: Well developed, in no acute distress   Neurological examination  Mentation: Alert oriented to time, place, history taking. Follows all commands speech and language fluent Cranial nerve II-XII: Pupils were equal round reactive to light. Extraocular movements were full, visual field were full on confrontational test. Facial sensation and strength were normal. Uvula tongue midline. Head turning and shoulder shrug  were normal and symmetric. Motor: The motor testing reveals 5 over 5 strength of all 4 extremities. Good symmetric motor tone is noted throughout.  Sensory: Sensory testing is intact to soft touch on all 4 extremities. No evidence of extinction is noted.  Coordination: Cerebellar testing reveals good finger-nose-finger and heel-to-shin bilaterally.  Gait and station: Gait is normal. Tandem gait is normal. Romberg is negative. No drift is seen.  Reflexes: Deep tendon reflexes are symmetric and normal bilaterally.   DIAGNOSTIC DATA (LABS, IMAGING, TESTING) - I reviewed patient records, labs, notes, testing and imaging myself where available.  Lab Results  Component Value Date   WBC 9.3 05/14/2023   HGB 12.9  05/14/2023   HCT 38.0 05/14/2023   MCV 97.8 05/14/2023   PLT 173 05/14/2023      Component Value Date/Time   NA 139 05/14/2023 0954   K 4.3 05/14/2023 0954   CL 103 05/14/2023 0954   CO2 26 05/14/2023 0948   GLUCOSE 103 (H) 05/14/2023 0954   GLUCOSE 105 (H) 02/16/2006 1506   BUN 22 05/14/2023 0954   CREATININE 1.00 05/14/2023 0954   CREATININE 1.09 (H) 11/02/2019 1121   CALCIUM 9.3 05/14/2023 0948   PROT 6.5 05/14/2023 0948   ALBUMIN 3.8 05/14/2023 0948   AST 29 05/14/2023 0948   ALT 25 05/14/2023 0948   ALKPHOS 127 (H) 05/14/2023 0948   BILITOT 0.9 05/14/2023 0948   GFRNONAA 59 (L) 05/14/2023 0948   GFRAA 76 (L) 03/21/2013 1204   Lab Results  Component Value Date   CHOL 132 04/07/2023   HDL 66 04/07/2023   LDLCALC 57 04/07/2023   LDLDIRECT 47.0 09/04/2015   TRIG 43 04/07/2023   CHOLHDL 2.0 04/07/2023   Lab Results  Component Value Date   HGBA1C 5.5 03/06/2023   Lab Results  Component Value Date   VITAMINB12 996 (H) 11/25/2021   Lab Results  Component Value Date   TSH 1.862 04/08/2023      ASSESSMENT AND PLAN 89 y.o. year old female  has a past medical history of Atrial fibrillation (HCC), Collagenous colitis, CVA (cerebral vascular accident) (HCC), History of postmenopausal HRT, adenomatous colonic polyps, Hypothyroidism, MVP (mitral valve prolapse), Osteoarthritis, and Osteoporosis. here with:    *** : Residual deficit: ***. Continue Eliquis (apixaban) daily  and ***  for secondary stroke prevention.  Discussed  secondary stroke prevention measures and importance of close PCP follow up for aggressive stroke risk factor management. I have gone over the pathophysiology of stroke, warning signs and symptoms, risk factors and their management in some detail with instructions to go to the closest emergency room for symptoms of concern. HTN: BP goal <130/90.  Stable on *** per PCP HLD: LDL goal <70. Recent LDL 57.  DMII: A1c goal<7.0. Recent A1c 5.5.  Encouraged  patient to monitor diet and encouraged exercise FU with our office       Butch Penny, MSN, NP-C 06/02/2023, 5:58 PM [facility] [address], [address] [place], [place] [address] [phone]

## 2023-06-03 ENCOUNTER — Encounter: Payer: Self-pay | Admitting: Adult Health

## 2023-06-03 ENCOUNTER — Ambulatory Visit: Admitting: Adult Health

## 2023-06-03 ENCOUNTER — Non-Acute Institutional Stay (SKILLED_NURSING_FACILITY): Payer: Self-pay | Admitting: Orthopedic Surgery

## 2023-06-03 ENCOUNTER — Encounter: Payer: Self-pay | Admitting: Orthopedic Surgery

## 2023-06-03 VITALS — BP 138/82 | HR 78 | Ht 60.0 in | Wt 101.6 lb

## 2023-06-03 DIAGNOSIS — R269 Unspecified abnormalities of gait and mobility: Secondary | ICD-10-CM | POA: Diagnosis not present

## 2023-06-03 DIAGNOSIS — R9401 Abnormal electroencephalogram [EEG]: Secondary | ICD-10-CM | POA: Diagnosis not present

## 2023-06-03 DIAGNOSIS — I639 Cerebral infarction, unspecified: Secondary | ICD-10-CM

## 2023-06-03 DIAGNOSIS — Z4789 Encounter for other orthopedic aftercare: Secondary | ICD-10-CM | POA: Diagnosis not present

## 2023-06-03 DIAGNOSIS — I480 Paroxysmal atrial fibrillation: Secondary | ICD-10-CM

## 2023-06-03 DIAGNOSIS — F339 Major depressive disorder, recurrent, unspecified: Secondary | ICD-10-CM

## 2023-06-03 DIAGNOSIS — Z8781 Personal history of (healed) traumatic fracture: Secondary | ICD-10-CM

## 2023-06-03 DIAGNOSIS — M6281 Muscle weakness (generalized): Secondary | ICD-10-CM | POA: Diagnosis not present

## 2023-06-03 DIAGNOSIS — Z87898 Personal history of other specified conditions: Secondary | ICD-10-CM

## 2023-06-03 DIAGNOSIS — E034 Atrophy of thyroid (acquired): Secondary | ICD-10-CM

## 2023-06-03 DIAGNOSIS — Z7189 Other specified counseling: Secondary | ICD-10-CM

## 2023-06-03 DIAGNOSIS — Z8673 Personal history of transient ischemic attack (TIA), and cerebral infarction without residual deficits: Secondary | ICD-10-CM

## 2023-06-03 NOTE — Progress Notes (Signed)
 Location:  [facility] Room Number: 9/A Place of Service:  SNF [id number]) Provider:  Octavia Heir, NP   Mahlon Gammon, MD  Patient Care Team: Mahlon Gammon, MD as PCP - General (Internal Medicine) Wendall Stade, MD as PCP - Cardiology (Cardiology) Mckinley Jewel, MD as Consulting Physician (Ophthalmology) Mitzi Davenport, Minerva Fester, MD as Referring Physician (Ophthalmology) Huston Foley, MD as Consulting Physician (Neurology) Erroll Luna, [facility] (Inactive) as Pharmacist (Pharmacist) Mahlon Gammon, MD as Consulting Physician (Internal Medicine)  Extended Emergency Contact Information Primary Emergency Contact: McGinnis,Eileen Mobile Phone: [phone] Relation: Other Secondary Emergency Contact: Heyge,Lorna Mobile Phone: [phone] Relation: Friend Interpreter needed? No  Code Status:  Full code Goals of care: Advanced Directive information    05/18/2023    2:55 PM  Advanced Directives  Does Patient Have a Medical Advance Directive? Yes  Type of Estate agent of [place];Living will  Does patient want to make changes to medical advance directive? No - Patient declined  Copy of Healthcare Power of Attorney in Chart? Yes - validated most recent copy scanned in chart (See row information)     Chief Complaint  Patient presents with   Medical Management of Chronic Issues    HPI:  Pt is a 89 y.o. female seen today for medical management of chronic diseases.    She currently resides on the skilled nursing unit at [facility]. PMH: left MCA stroke 2015, TIA, atrial fib, MVR, HTN, HLD, Raynaud's, hypothyroidism, macular degeneration, GERD, CKD, and depression.   H/o CVA- left MCA stroke 2015, 01/20 embolic stroke involving left MCA/PCA, right MCA/ACA punctate (symptoms slurred speech and confusion), recent stoke thought to be related to PAF, she was switched from Xarelto to Eliquis, remains on rosuvastatin> BP goal < 130/90,  LDL goal < 70, A1c< 5.5.  H/o seizure- 02/27 ED evaluation due to aphasia and left sided weakness, CT head no acute intracranial abnormality, EEG negative, neurology recommended Vimpat BID and outpatient neuro f/u 03/19> no changes to Vimpat, no recent seizures S/p right hip fracture- 12/19 right hip hemiarthroplasty, WBAT, pain controlled without medication, ambulating with walker, followed by PT/OT PAF- HR controlled with metoprolol, remains on Eliquis for clot prevention Hypothyroidism- TSH 1.862 04/08/2023, remains on levothyroxine Depression- Na+ 139 05/14/2023, remains on Lexapro  Advanced care planning discussed. She would like to be DNR. She has a healthcare advocate, plan to discuss with them.   Recent weights:  03/01- 95 lbs  02/06- 96.1 lbs  01/02- 96 lbs  Recent blood pressures:  03/18- 108/62  03/11- 129/70  03/04- 128/45  Past Medical History:  Diagnosis Date   Atrial fibrillation (HCC)    AFIB   Collagenous colitis    CVA (cerebral vascular accident) (HCC)    a.  L parietal CVA by MRI 12/12, likely embolic;  b.  TEE by report with neg bubble study;  c.  carotid dopplers  12/12:  + plaque, no sig ICA stenosis    History of postmenopausal HRT    Hx of adenomatous colonic polyps    Hypothyroidism    MVP (mitral valve prolapse)    a. s/p MV repair at [facility] in 2008;  b. Echocardiogram 7/12: EF 50%, status post mitral valve repair with mild MR and minimal MS, mean gradient 4, moderate LAE, LA diam 55 mm; mild RAE, PASP 28-32;    Osteoarthritis    Osteoporosis    Past Surgical History:  Procedure Laterality Date   ANTERIOR  APPROACH HEMI HIP ARTHROPLASTY Right 03/05/2023   Procedure: ANTERIOR APPROACH HEMI HIP ARTHROPLASTY;  Surgeon: Samson Frederic, MD;  Location: [facility] ORS;  Service: Orthopedics;  Laterality: Right;   CARDIOVERSION  01/09/2012   Procedure: CARDIOVERSION;  Surgeon: Dolores Patty, MD;  Location: [facility] ENDOSCOPY;  Service: Cardiovascular;  Laterality: N/A;    CATARACT EXTRACTION     CHOLECYSTECTOMY     DILATION AND CURETTAGE OF UTERUS     LUMBAR FUSION     MITRAL VALVE REPAIR  03/17/2006   "mitral valve repair CE ring and maze procedure"   TONSILLECTOMY      Allergies  Allergen Reactions   Tape Other (See Comments)    Skin tears and bruises easily.   Vioxx [Rofecoxib] Other (See Comments)    Elevated LFT's    Outpatient Encounter Medications as of 06/03/2023  Medication Sig   amoxicillin (AMOXIL) 500 MG tablet Take 2,000 mg by mouth daily at 12 noon.   apixaban (ELIQUIS) 2.5 MG TABS tablet Take 1 tablet (2.5 mg total) by mouth 2 (two) times daily.   Calcium Citrate-Vitamin D (CITRACAL PETITES/VITAMIN D) 200-6.25 MG-MCG TABS Take 1 tablet by mouth 2 (two) times daily.   docusate sodium (COLACE) 100 MG capsule Take 1 capsule (100 mg total) by mouth 2 (two) times daily.   escitalopram (LEXAPRO) 20 MG tablet Take 20 mg by mouth daily.   fluticasone (FLONASE) 50 MCG/ACT nasal spray Place 2 sprays into both nostrils daily.   glucosamine-chondroitin 500-400 MG tablet Take 1 tablet by mouth 2 (two) times daily.   guaiFENesin (MUCINEX) 600 MG 12 hr tablet Take 600 mg by mouth 2 (two) times daily.   lacosamide (VIMPAT) 50 MG TABS tablet Take 1 tablet (50 mg total) by mouth 2 (two) times daily.   lactose free nutrition (BOOST) LIQD Take 237 mLs by mouth 2 (two) times daily between meals.   levothyroxine (SYNTHROID) 75 MCG tablet TAKE 1 TABLET BY MOUTH EVERY DAY BEFORE BREAKFAST   loratadine (CLARITIN) 10 MG tablet Take 1 tablet (10 mg total) by mouth daily.   metoprolol tartrate (LOPRESSOR) 25 MG tablet Take 1 tablet (25 mg total) by mouth 2 (two) times daily.   Multiple Vitamins-Minerals (PRESERVISION AREDS 2 PO) Take 1 capsule by mouth 2 (two) times daily. Reported on 09/04/2015   polyethylene glycol (MIRALAX / GLYCOLAX) 17 g packet Take 17 g by mouth daily as needed for mild constipation.   rosuvastatin (CRESTOR) 10 MG tablet Take 10 mg by  mouth 2 (two) times daily.   Saline (SIMPLY SALINE) 0.9 % AERS Place 2 each into the nose daily as needed.   TYLENOL 8 HOUR ARTHRITIS PAIN 650 MG CR tablet Take 650 mg by mouth daily.   vitamin A 16109 UNIT capsule Take 1 capsule by mouth daily.   zinc oxide 20 % ointment Apply 1 Application topically See admin instructions. Apply topically to peri/buttocks as needed after each incontinent episode for skin protection.   No facility-administered encounter medications on file as of 06/03/2023.    Review of Systems  Constitutional:  Negative for fatigue and fever.  HENT:  Negative for sore throat and trouble swallowing.   Respiratory:  Negative for shortness of breath.   Cardiovascular:  Negative for chest pain.  Gastrointestinal:  Negative for abdominal distention and abdominal pain.  Genitourinary:  Negative for dysuria, frequency and hematuria.  Musculoskeletal:  Positive for arthralgias and gait problem.  Skin:  Negative for wound.  Neurological:  Positive for weakness. Negative  for dizziness, seizures and headaches.  Psychiatric/Behavioral:  Positive for confusion. Negative for dysphoric mood and sleep disturbance. The patient is not nervous/anxious.     Immunization History  Administered Date(s) Administered   Fluad Quad(high Dose 65+) 11/30/2018, 01/17/2020   Fluad Trivalent(High Dose 65+) 03/02/2023   Influenza Split 12/17/2010, 12/02/2011   Influenza Whole 03/17/2001, 12/18/2006, 12/28/2007, 01/03/2009, 12/26/2009   Influenza, High Dose Seasonal PF 12/22/2012, 03/06/2015, 11/26/2015, 12/09/2017   Influenza,inj,Quad PF,6+ Mos 11/23/2013   Influenza-Unspecified 11/20/2016   PFIZER(Purple Top)SARS-COV-2 Vaccination 06/09/2019, 06/30/2019, 02/16/2020   Pneumococcal Conjugate-13 03/02/2014   Pneumococcal Polysaccharide-23 03/17/2000, 09/16/2006   Td 03/18/1995, 09/15/2006, 02/22/2021   Tdap 12/17/2010   Unspecified SARS-COV-2 Vaccination 12/08/2020   Zoster Recombinant(Shingrix)  09/18/2016, 11/20/2016   Pertinent  Health Maintenance Due  Topic Date Due   INFLUENZA VACCINE  Completed   DEXA SCAN  Completed      11/25/2021   10:46 AM 02/11/2022   11:26 AM 05/26/2022   11:09 AM 03/02/2023    3:17 PM 05/18/2023   11:29 AM  Fall Risk  Falls in the past year? 0 1 1 1 1   Was there an injury with Fall? 0 1 1 0 1  Was there an injury with Fall? - Comments  fractured rib     Fall Risk Category Calculator 0 3 3 2 2   Fall Risk Category (Retired) Low High     (RETIRED) Patient Fall Risk Level Low fall risk Low fall risk     Patient at Risk for Falls Due to No Fall Risks No Fall Risks History of fall(s) Impaired balance/gait;Impaired mobility;History of fall(s) History of fall(s);Impaired balance/gait;Impaired mobility  Fall risk Follow up Falls evaluation completed Falls evaluation completed Falls evaluation completed Falls prevention discussed Falls evaluation completed;Education provided   Functional Status Survey:    Vitals:   06/03/23 1529  BP: 108/62  Pulse: 64  Resp: 17  Temp: (!) 97.5 F (36.4 C)  SpO2: 90%  Weight: 95 lb (43.1 kg)  Height: 5' (1.524 m)   Body mass index is 18.55 kg/m. Physical Exam Vitals reviewed.  Constitutional:      General: She is not in acute distress. HENT:     Head: Normocephalic.     Right Ear: There is no impacted cerumen.     Left Ear: There is no impacted cerumen.     Nose: Nose normal.     Mouth/Throat:     Mouth: Mucous membranes are moist.  Eyes:     General:        Right eye: No discharge.        Left eye: No discharge.  Cardiovascular:     Rate and Rhythm: Normal rate. Rhythm irregular.     Pulses: Normal pulses.     Heart sounds: Normal heart sounds.  Pulmonary:     Effort: Pulmonary effort is normal. No respiratory distress.     Breath sounds: Normal breath sounds. No wheezing or rales.  Abdominal:     General: Bowel sounds are normal.     Palpations: Abdomen is soft.  Musculoskeletal:     Cervical  back: Neck supple.     Right lower leg: No edema.     Left lower leg: No edema.  Skin:    General: Skin is warm.     Capillary Refill: Capillary refill takes less than 2 seconds.  Neurological:     General: No focal deficit present.     Mental Status: She is alert. Mental status  is at baseline.     Motor: Weakness present.     Gait: Gait abnormal.  Psychiatric:        Mood and Affect: Mood normal.     Comments: Some word finding, alert to self/person/place/situation, follows commands     Labs reviewed: Recent Labs    03/06/23 0337 03/07/23 0352 03/08/23 0352 04/06/23 1326 04/08/23 0438 04/08/23 0439 05/14/23 0948 05/14/23 0954  NA 130* 133* 139 137 140  --  139 139  K 4.0 4.6 4.4 5.0 3.6  --  4.3 4.3  CL 97* 98 103 101 105  --  102 103  CO2 26 29 30 26 23   --  26  --   GLUCOSE 154* 131* 95 98 89  --  110* 103*  BUN 25* 34* 27* 25* 24*  --  21 22  CREATININE 0.93 0.89 0.64 0.94 0.89  --  0.93 1.00  CALCIUM 7.7* 8.5* 8.6* 9.3 8.6*  --  9.3  --   MG  --  2.1 2.2  --   --  2.0  --   --   PHOS 3.1 1.6* 3.1  --   --   --   --   --    Recent Labs    03/03/23 2200 03/05/23 0748 03/08/23 0352 04/06/23 1326 05/14/23 0948  AST 29  --   --  33 29  ALT 42  --   --  28 25  ALKPHOS 81  --   --  159* 127*  BILITOT 0.7  --   --  0.7 0.9  PROT 5.9*  --   --  6.2* 6.5  ALBUMIN 3.6   < > 2.8* 3.6 3.8   < > = values in this interval not displayed.   Recent Labs    03/03/23 2124 03/05/23 0748 03/08/23 0352 04/06/23 1326 05/14/23 0948 05/14/23 0954  WBC 10.9*   < > 9.5 10.5 9.3  --   NEUTROABS 9.0*  --   --  8.0* 7.2  --   HGB 13.4   < > 11.4* 11.9* 12.7 12.9  HCT 41.0   < > 34.7* 37.9 39.8 38.0  MCV 102.2*   < > 99.7 101.6* 97.8  --   PLT 150   < > 167 191 173  --    < > = values in this interval not displayed.   Lab Results  Component Value Date   TSH 1.862 04/08/2023   Lab Results  Component Value Date   HGBA1C 5.5 03/06/2023   Lab Results  Component Value  Date   CHOL 132 04/07/2023   HDL 66 04/07/2023   LDLCALC 57 04/07/2023   LDLDIRECT 47.0 09/04/2015   TRIG 43 04/07/2023   CHOLHDL 2.0 04/07/2023    Significant Diagnostic Results in last 30 days:  EEG adult Result Date: 05/14/2023 Charlsie Quest, MD     05/14/2023  3:11 PM Patient Name: Nera Haworth MRN: [id number] Epilepsy Attending: Charlsie Quest Referring Physician/Provider: Gordy Councilman, MD Date: 05/14/2023 Duration: 23.38 mins Patient history: 89yo F with an episode of aphasia and left sided weakness. EEG to evaluate for seizure Level of alertness: Awake AEDs during EEG study: None Technical aspects: This EEG study was done with scalp electrodes positioned according to the 10-20 International system of electrode placement. Electrical activity was reviewed with band pass filter of 1-70Hz , sensitivity of 7 uV/mm, display speed of 75mm/sec with a 60Hz  notched filter applied as appropriate. EEG data  were recorded continuously and digitally stored.  Video monitoring was available and reviewed as appropriate. Description: The posterior dominant rhythm consists of 8 Hz activity of moderate voltage (25-35 uV) seen predominantly in posterior head regions, symmetric and reactive to eye opening and eye closing. EEG showed intermittent 3 to 6 Hz theta-delta slowing in left fronto-temporal region. Hyperventilation and photic stimulation were not performed.   ABNORMALITY - Intermittent slow, left fronto-temporal region IMPRESSION: This study is suggestive of cortical dysfunction arising from left  fronto-temporal region. No seizures or epileptiform discharges were seen throughout the recording. Please note lack of epileptiform activity during interictal EEG does not exclude the diagnosis of epilepsy. Charlsie Quest   MR BRAIN WO CONTRAST Result Date: 05/14/2023 CLINICAL DATA:  Stroke EXAM: MRI HEAD WITHOUT CONTRAST TECHNIQUE: Multiplanar, multiecho pulse sequences of the brain and surrounding  structures were obtained without intravenous contrast. COMPARISON:  Same-day head CT.  MRI head 04/06/2023. FINDINGS: Brain: No acute infarct. No evidence of intracranial hemorrhage. FLAIR hyperintensity in the periventricular and subcortical white matter suggestive of chronic microvascular ischemic changes. Encephalomalacia and gliosis in the left frontal operculum compatible with remote infarct. Remote lacunar infarcts in the bilateral basal ganglia and additional small remote infarcts in the bilateral cerebellar hemispheres. No mass lesion or midline shift. Cerebellum is unremarkable. Normal appearance of midline structures. The basilar cisterns are patent. No extra-axial fluid collections. Ventricles: Prominence of the lateral ventricles suggestive of underlying parenchymal volume loss. Vascular: Skull base flow voids are visualized. Skull and upper cervical spine: No focal abnormality. Sinuses/Orbits: Orbits are symmetric. Paranasal sinuses are clear. Other: Mastoid air cells are clear. IMPRESSION: 1. No acute intracranial abnormality. 2. Chronic microvascular ischemic changes and parenchymal volume loss. 3. Remote infarct in the left frontal operculum. Additional small remote infarcts in the bilateral basal ganglia and cerebellum. Electronically Signed   By: Emily Filbert M.D.   On: 05/14/2023 11:50   CT HEAD CODE STROKE WO CONTRAST Result Date: 05/14/2023 CLINICAL DATA:  Code stroke.  Neuro deficit. EXAM: CT HEAD WITHOUT CONTRAST TECHNIQUE: Contiguous axial images were obtained from the base of the skull through the vertex without intravenous contrast. RADIATION DOSE REDUCTION: This exam was performed according to the departmental dose-optimization program which includes automated exposure control, adjustment of the mA and/or kV according to patient size and/or use of iterative reconstruction technique. COMPARISON:  MRI head 04/06/2023 FINDINGS: Brain: No acute intracranial hemorrhage. No CT evidence of  acute infarct. Encephalomalacia in the left frontal operculum compatible with remote infarct. Nonspecific hypoattenuation in the periventricular and subcortical white matter favored to reflect chronic microvascular ischemic changes. Small remote infarcts in the bilateral cerebellum. No edema, mass effect, or midline shift. The basilar cisterns are patent. Ventricles: The ventricles are normal. Vascular: Atherosclerosis of the carotid siphons. No hyperdense vessel. Skull: No acute or aggressive finding. Orbits: Orbits are symmetric. Sinuses: The visualized paranasal sinuses are clear. Other: Mastoid air cells are clear. ASPECTS [facility] Stroke Program Early CT Score) - Ganglionic level infarction (caudate, lentiform nuclei, internal capsule, insula, M1-M3 cortex): 7 - Supraganglionic infarction (M4-M6 cortex): 3 Total score (0-10 with 10 being normal): 10 IMPRESSION: 1. No CT evidence of acute intracranial abnormality. 2. Redemonstrated remote infarct involving the left frontal operculum. Additional small remote infarcts in the bilateral cerebellum. 3. ASPECTS is 10 These results were communicated to Dr. Iver Nestle At 10:06 am on 05/14/2023 by text page via the [facility] messaging system. Electronically Signed   By: Emily Filbert M.D.   On:  05/14/2023 10:07    Assessment/Plan 1. Advanced directives, counseling/discussion (Primary) - discussed DNR/MOST forms - she would like to be DNR - has healthcare advocate> left message   2. History of ischemic left MCA stroke - 02/27 left sided weakness and aphasia> code stroke -  left MCA stroke 2015 - 01/20 embolic stroke ( involved left and right MCA/PCA) > slurred speech and confusion presentation - BP goal< 130/70, LDL< 70, A1c < 5.5 - exam unremarkable today, but forgetful - Xarelto switched to Eliquis - cont rosuvastatin  3. History of seizure -  02/27 left sided weakness and aphasia> code stroke - EEG negative - no recent episodes - 03/19 neuro f/u > continue  Vimpat  4. S/P right hip fracture - 12/19 right hip hemiarthroplasty - WBAT - no pain - cont PT  5. Paroxysmal atrial fibrillation with RVR (HCC) - HR< 100 with metoprolol - cont Eliquis for clot prevention  6. Hypothyroidism due to acquired atrophy of thyroid - TSH stable - cont levothyroxine  7. Recurrent depression (HCC) - Na+ stable - cont Lexapro    Family/ staff Communication: plan discussed with patient and nurse  Labs/tests ordered:  none

## 2023-06-03 NOTE — Progress Notes (Signed)
 PATIENT: Theresa [name] DOB: 1931-12-31  REASON FOR VISIT: follow up HISTORY FROM: patient PRIMARY NEUROLOGIST: Dr. Frances Furbish  Chief Complaint  Patient presents with   Follow-up    Patient in room # 18 and alone. Patient states she here to follow up with her stroke she had recently.     HISTORY OF PRESENT ILLNESS: Today 06/03/23  Theresa [name] is a 89 y.o. female who is here today for Hospital follow-up.   She was admitted to the ED on 04/06/23 after experiencing three episodes of transient confusion and speech difficulty. MRI results showed multifocal acute ischemic strokes. She reports feeling well today and denies any further episodes of confusion since the stroke. While she has not noticed any new neurological deficits, she does mention a change in her speech--it's slower and raspy, which may differ from her baseline. She also continues to have difficulty with word-finding, a chronic issue she has had even before the recent stroke. She is compliant with her medications, including Eliquis, and reports no concerns or side effects with her current treatment.  Had a second visit to the ED in February for transient aphasia and left-sided weakness.  Due to EEG showing slowing she was placed on Vimpat.  The patient is preparing to move into an assisted living facility soon, where she will live with her husband. She reports no issues with memory and is able to manage her finances independently. Prior to her hip fracture, she was able to complete all activities of daily living (ADLs) on her own, but since the fracture, she requires some assistance, particularly with mobility. She currently uses a walker and receives help with some tasks. She does not cook and eats at her friend's home. In December 2024, she experienced a right hip fracture and underwent hemi hip arthroplasty. Additionally, she notes mild left arm weakness, which she attributes to a childhood  fracture.  EEG IMPRESSION: This study is suggestive of cortical dysfunction arising from left  fronto-temporal region. No seizures or epileptiform discharges were seen throughout the recording.  MRI brain wo contrast: IMPRESSION:05/14/23 1. No acute intracranial abnormality. 2. Chronic microvascular ischemic changes and parenchymal volume loss. 3. Remote infarct in the left frontal operculum. Additional small remote infarcts in the bilateral basal ganglia and cerebellum.  CTA HEAD AND NECK:04/06/23   1. Negative CTA for large vessel occlusion or other emergent finding. 2. Mild for age atheromatous disease without hemodynamically significant stenosis. 3.  Aortic Atherosclerosis (ICD10-I70.0). 4. Small layering right pleural effusion.  MRI brain wo contrast 04/06/23: IMPRESSION: 1. Multiple small foci of restricted diffusion in the superior left cerebellar hemisphere, left occipital lobe, bilateral frontal lobes and right parietal lobe, consistent with acute infarcts, likely embolic. 2. Area of encephalomalacia and gliosis in the left frontoparietal region. 3. Multiple small chronic infarcts in the bilateral cerebellar hemispheres. 4. Moderate parenchymal volume loss.        HISTORY [name] is a 89 y.o. female with PMH significant for atrial fibrillation (on Xarelto at home), hypertension, hyperlipidemia, left parietal stroke in 2012, hypothyroidism, MVR 2008, OA, recent right hip fracture status post Hemi hip arthroplasty December 2024 who presented to the ED 1/20 due to 3 episodes of transient confusion and speech difficulty. MRI brain reveals multifocal punctate acute ischemic strokes.  CTA negative for LVO. Patient endorsed compliance with Xarelto.  Stroke: left cerebellar, L MCA/PCA, R MCA/ACA punctate acute ischemic strokes Etiology: Likely due to atrial fibrillation with failure of Xarelto CTA head &  neck  Negative CTA for large vessel occlusion or other  emergent finding. Mild for age atheromatous disease without hemodynamically significant stenosis. Aortic atherosclerosis  MRI   Several punctate/small foci of restricted diffusion are seen: one in the superior left cerebellar hemisphere, one in the left occipital lobe, the bilateral frontal lobes and right parietal lobe, consistent with acute infarcts, likely embolic. Area of encephalomalacia and gliosis in the left frontoparietal region is also noted. Multiple small chronic infarcts in the bilateral cerebellar hemispheres. Moderate parenchymal volume loss.  VAS [place] LE DVT: Negative 2D Echo EF 55-60% LDL 57 HgbA1c 5.5 in 02/2023 VTE prophylaxis - Eliquis  Xarelto (rivaroxaban) daily prior to admission, switch to Eliquis 2.5 mg twice daily Therapy recommendations:  SNF Disposition: Pending   Hx of Stroke/TIA 02/2011 admitted for aphasia and right facial droop.  MRI: Left parietal infarct.  Changed from aspirin to Plavix, put on Lipitor.   Started on Coumadin around 05/2011 03/2013 admitted for transient left-sided weakness and slurring speech.  MRI negative.  Continue on Xarelto, and recommend to take with big meal      REVIEW OF SYSTEMS: Out of a complete 14 system review of symptoms, the patient complains only of the following symptoms, and all other reviewed systems are negative.  ALLERGIES: Allergies  Allergen Reactions   Tape Other (See Comments)    Skin tears and bruises easily.   Vioxx [Rofecoxib] Other (See Comments)    Elevated LFT's    HOME MEDICATIONS: Outpatient Medications Prior to Visit  Medication Sig Dispense Refill   amoxicillin (AMOXIL) 500 MG tablet Take 2,000 mg by mouth daily at 12 noon.     apixaban (ELIQUIS) 2.5 MG TABS tablet Take 1 tablet (2.5 mg total) by mouth 2 (two) times daily. 60 tablet 0   Calcium Citrate-Vitamin D (CITRACAL PETITES/VITAMIN D) 200-6.25 MG-MCG TABS Take 1 tablet by mouth 2 (two) times daily.     docusate sodium (COLACE) 100 MG capsule  Take 1 capsule (100 mg total) by mouth 2 (two) times daily.     escitalopram (LEXAPRO) 20 MG tablet Take 20 mg by mouth daily.     fluticasone (FLONASE) 50 MCG/ACT nasal spray Place 2 sprays into both nostrils daily. 16 g 6   glucosamine-chondroitin 500-400 MG tablet Take 1 tablet by mouth 2 (two) times daily.     guaiFENesin (MUCINEX) 600 MG 12 hr tablet Take 600 mg by mouth 2 (two) times daily.     lacosamide (VIMPAT) 50 MG TABS tablet Take 1 tablet (50 mg total) by mouth 2 (two) times daily. 60 tablet 2   lactose free nutrition (BOOST) LIQD Take 237 mLs by mouth 2 (two) times daily between meals.     levothyroxine (SYNTHROID) 75 MCG tablet TAKE 1 TABLET BY MOUTH EVERY DAY BEFORE BREAKFAST 90 tablet 3   loratadine (CLARITIN) 10 MG tablet Take 1 tablet (10 mg total) by mouth daily. 30 tablet 11   metoprolol tartrate (LOPRESSOR) 25 MG tablet Take 1 tablet (25 mg total) by mouth 2 (two) times daily.     Multiple Vitamins-Minerals (PRESERVISION AREDS 2 PO) Take 1 capsule by mouth 2 (two) times daily. Reported on 09/04/2015     polyethylene glycol (MIRALAX / GLYCOLAX) 17 g packet Take 17 g by mouth daily as needed for mild constipation.     rosuvastatin (CRESTOR) 10 MG tablet Take 10 mg by mouth 2 (two) times daily.     Saline (SIMPLY SALINE) 0.9 % AERS Place 2 each into the  nose daily as needed. 500 mL 1   TYLENOL 8 HOUR ARTHRITIS PAIN 650 MG CR tablet Take 650 mg by mouth daily.     vitamin A 08657 UNIT capsule Take 1 capsule by mouth daily.     zinc oxide 20 % ointment Apply 1 Application topically See admin instructions. Apply topically to peri/buttocks as needed after each incontinent episode for skin protection.     No facility-administered medications prior to visit.    PAST MEDICAL HISTORY: Past Medical History:  Diagnosis Date   Atrial fibrillation (HCC)    AFIB   Collagenous colitis    CVA (cerebral vascular accident) (HCC)    a.  L parietal CVA by MRI 12/12, likely embolic;  b.   TEE by report with neg bubble study;  c.  carotid dopplers  12/12:  + plaque, no sig ICA stenosis    History of postmenopausal HRT    Hx of adenomatous colonic polyps    Hypothyroidism    MVP (mitral valve prolapse)    a. s/p MV repair at [facility] in 2008;  b. Echocardiogram 7/12: EF 50%, status post mitral valve repair with mild MR and minimal MS, mean gradient 4, moderate LAE, LA diam 55 mm; mild RAE, PASP 28-32;    Osteoarthritis    Osteoporosis     PAST SURGICAL HISTORY: Past Surgical History:  Procedure Laterality Date   ANTERIOR APPROACH HEMI HIP ARTHROPLASTY Right 03/05/2023   Procedure: ANTERIOR APPROACH HEMI HIP ARTHROPLASTY;  Surgeon: Samson Frederic, MD;  Location: [facility] ORS;  Service: Orthopedics;  Laterality: Right;   CARDIOVERSION  01/09/2012   Procedure: CARDIOVERSION;  Surgeon: Dolores Patty, MD;  Location: [facility] ENDOSCOPY;  Service: Cardiovascular;  Laterality: N/A;   CATARACT EXTRACTION     CHOLECYSTECTOMY     DILATION AND CURETTAGE OF UTERUS     LUMBAR FUSION     MITRAL VALVE REPAIR  03/17/2006   "mitral valve repair CE ring and maze procedure"   TONSILLECTOMY      FAMILY HISTORY: Family History  Problem Relation Age of Onset   Cancer Father        COLON   Stroke Neg Hx        1ST DEGREE RELATIVE <60    SOCIAL HISTORY: Social History   Socioeconomic History   Marital status: Married    Spouse name: Not on file   Number of children: Not on file   Years of education: Not on file   Highest education level: Master's degree (e.g., MA, MS, MEng, MEd, MSW, MBA)  Occupational History   Occupation: RETIRED    Employer: RETIRED    Comment: Psychiatric nurse  Tobacco Use   Smoking status: Former    Current packs/day: 0.00    Types: Cigarettes    Quit date: 04/28/1966    Years since quitting: 57.1   Smokeless tobacco: Never  Vaping Use   Vaping status: Never Used  Substance and Sexual Activity   Alcohol use: Yes    Alcohol/week: 7.0 standard drinks of alcohol     Types: 7 Glasses of wine per week    Comment: 1 small glass with luch and dinner   Drug use: No   Sexual activity: Not Currently  Other Topics Concern   Not on file  Social History Narrative   Married for 12 years-1st marriage for both. No kids. No pets.       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  Lived in [place] for 10 years prior (masters in music and music education)-undergrad at [employer]: eating out, opera in [place]   Social Drivers of Health   Financial Resource Strain: Low Risk  (03/02/2023)   Overall Financial Resource Strain (CARDIA)    Difficulty of Paying Living Expenses: Not hard at all  Food Insecurity: No Food Insecurity (04/07/2023)   Hunger Vital Sign    Worried About Running Out of Food in the Last Year: Never true    Ran Out of Food in the Last Year: Never true  Transportation Needs: No Transportation Needs (04/07/2023)   PRAPARE - Administrator, Civil Service (Medical): No    Lack of Transportation (Non-Medical): No  Physical Activity: Insufficiently Active (03/02/2023)   Exercise Vital Sign    Days of Exercise per Week: 5 days    Minutes of Exercise per Session: 20 min  Stress: No Stress Concern Present (03/02/2023)   [employer] of Occupational Health - Occupational Stress Questionnaire    Feeling of Stress : Not at all  Social Connections: Moderately Isolated (04/07/2023)   Social Connection and Isolation Panel [NHANES]    Frequency of Communication with Friends and Family: Once a week    Frequency of Social Gatherings with Friends and Family: More than three times a week    Attends Religious Services: Never    Database administrator or Organizations: No    Attends Banker Meetings: Never    Marital Status: Married  Catering manager Violence: Not At Risk (04/07/2023)   Humiliation, Afraid, Rape, and Kick questionnaire    Fear of Current or Ex-Partner: No    Emotionally Abused:  No    Physically Abused: No    Sexually Abused: No      PHYSICAL EXAM  Vitals:   06/03/23 0911  BP: 138/82  Pulse: 78  Weight: 101 lb 9.6 oz (46.1 kg)  Height: 5' (1.524 m)   Body mass index is 19.84 kg/m.     06/03/2023   11:37 AM 10/30/2017   10:17 AM 10/29/2016   10:37 AM  MMSE - Mini Mental State Exam  Orientation to time 5 4 5   Orientation to Place 5 5 5   Registration 3 3 3   Attention/ Calculation 1 5 5   Recall 3 2 3   Language- name 2 objects 2 2 2   Language- repeat 1 1 1   Language- follow 3 step command 3 3 3   Language- read & follow direction 1 1 1   Write a sentence 1 1 1   Copy design 1 1 1   Total score 26 28 30      Generalized: Well developed, in no acute distress   Neurological examination  Mentation: Alert oriented to time, place, history taking. Follows all commands. mild expressive aphasia. Cranial nerve II-XII: Pupils were equal round reactive to light. Extraocular movements were full, visual field were full on confrontational test. Facial sensation and strength were normal. Uvula tongue midline. Head turning and shoulder shrug  were normal and symmetric. Motor: The motor examination reveals 5/5 strength in all four extremities, indicating normal strength. There is good, symmetric muscle tone throughout. The patient has limited ROM in the right leg, which she attributes to her recent hip fracture. Similarly, there is limited ROM in the left arm, which she attributes to a childhood fracture. Additionally,  Sensory: Sensory testing is intact to moderate touch on all 4 extremities. No evidence of extinction is noted. Hypoesthesia  on the right leg below the knee and on the right arm below the elbow. Edema noted in the RLE.  Speech: Difficulty with word finding, slower speech, and raspy voice;  Coordination: Cerebellar testing reveals good finger-nose-finger and heel-to-shin bilaterally.  Gait and station: Gait is normal. Tandem gait is normal. Romberg is  negative. No drift is seen. Walks with a walker post hip arthroplasty (Dec 2024) Reflexes:  1+ hyporeflexia noted in the right leg, otherwise 2+ in other extremities.   Cardiovascular:Heart rhythm Irregularly irregular (consistent with atrial fibrillation). No jugular venous distension or signs of heart failure.   DIAGNOSTIC DATA (LABS, IMAGING, TESTING) - I reviewed patient records, labs, notes, testing and imaging myself where available.  Lab Results  Component Value Date   WBC 9.3 05/14/2023   HGB 12.9 05/14/2023   HCT 38.0 05/14/2023   MCV 97.8 05/14/2023   PLT 173 05/14/2023      Component Value Date/Time   NA 139 05/14/2023 0954   K 4.3 05/14/2023 0954   CL 103 05/14/2023 0954   CO2 26 05/14/2023 0948   GLUCOSE 103 (H) 05/14/2023 0954   GLUCOSE 105 (H) 02/16/2006 1506   BUN 22 05/14/2023 0954   CREATININE 1.00 05/14/2023 0954   CREATININE 1.09 (H) 11/02/2019 1121   CALCIUM 9.3 05/14/2023 0948   PROT 6.5 05/14/2023 0948   ALBUMIN 3.8 05/14/2023 0948   AST 29 05/14/2023 0948   ALT 25 05/14/2023 0948   ALKPHOS 127 (H) 05/14/2023 0948   BILITOT 0.9 05/14/2023 0948   GFRNONAA 59 (L) 05/14/2023 0948   GFRAA 76 (L) 03/21/2013 1204   Lab Results  Component Value Date   CHOL 132 04/07/2023   HDL 66 04/07/2023   LDLCALC 57 04/07/2023   LDLDIRECT 47.0 09/04/2015   TRIG 43 04/07/2023   CHOLHDL 2.0 04/07/2023   Lab Results  Component Value Date   HGBA1C 5.5 03/06/2023   Lab Results  Component Value Date   VITAMINB12 996 (H) 11/25/2021   Lab Results  Component Value Date   TSH 1.862 04/08/2023      ASSESSMENT AND PLAN 89 y.o. year old female  has a past medical history of Atrial fibrillation (HCC), Collagenous colitis, CVA (cerebral vascular accident) (HCC), History of postmenopausal HRT, adenomatous colonic polyps, Hypothyroidism, MVP (mitral valve prolapse), Osteoarthritis, and Osteoporosis. here with:  Stroke: left cerebellar, L MCA/PCA, R MCA/ACA punctate  acute ischemic strokes Etiology: Likely due to atrial fibrillation with failure of Xarelto Possible focal seizures- EEG- slowing   Continue Eliquis (apixaban) daily   for secondary stroke prevention.  Discussed secondary stroke prevention measures and importance of close PCP follow up for aggressive stroke risk factor management. I have gone over the pathophysiology of stroke, warning signs and symptoms, risk factors and their management in some detail with instructions to go to the closest emergency room for symptoms of concern. HTN: BP goal <130/90.   HLD: LDL goal <70. Recent LDL 57.  DMII: A1c goal<7.0. Recent A1c 5.5.  Encouraged patient to monitor diet and encouraged exercise. Encourage mobility and strength exercises. Continue Vimpat 50 mg twice a day  FU with our office in 3-6 months or sooner if needed.       Butch Penny, MSN, NP-C 06/03/2023, 10:08 AM [facility] [address], [address] [place], [place] [address] [phone]

## 2023-06-04 DIAGNOSIS — Z4789 Encounter for other orthopedic aftercare: Secondary | ICD-10-CM | POA: Diagnosis not present

## 2023-06-04 DIAGNOSIS — S7291XA Unspecified fracture of right femur, initial encounter for closed fracture: Secondary | ICD-10-CM | POA: Diagnosis not present

## 2023-06-04 DIAGNOSIS — R41841 Cognitive communication deficit: Secondary | ICD-10-CM | POA: Diagnosis not present

## 2023-06-04 DIAGNOSIS — Z9181 History of falling: Secondary | ICD-10-CM | POA: Diagnosis not present

## 2023-06-04 DIAGNOSIS — M6281 Muscle weakness (generalized): Secondary | ICD-10-CM | POA: Diagnosis not present

## 2023-06-05 DIAGNOSIS — S7291XA Unspecified fracture of right femur, initial encounter for closed fracture: Secondary | ICD-10-CM | POA: Diagnosis not present

## 2023-06-05 DIAGNOSIS — R269 Unspecified abnormalities of gait and mobility: Secondary | ICD-10-CM | POA: Diagnosis not present

## 2023-06-05 DIAGNOSIS — M6281 Muscle weakness (generalized): Secondary | ICD-10-CM | POA: Diagnosis not present

## 2023-06-05 DIAGNOSIS — R41841 Cognitive communication deficit: Secondary | ICD-10-CM | POA: Diagnosis not present

## 2023-06-05 DIAGNOSIS — Z4789 Encounter for other orthopedic aftercare: Secondary | ICD-10-CM | POA: Diagnosis not present

## 2023-06-08 DIAGNOSIS — Z4789 Encounter for other orthopedic aftercare: Secondary | ICD-10-CM | POA: Diagnosis not present

## 2023-06-08 DIAGNOSIS — M6281 Muscle weakness (generalized): Secondary | ICD-10-CM | POA: Diagnosis not present

## 2023-06-08 DIAGNOSIS — S7291XA Unspecified fracture of right femur, initial encounter for closed fracture: Secondary | ICD-10-CM | POA: Diagnosis not present

## 2023-06-08 DIAGNOSIS — Z9181 History of falling: Secondary | ICD-10-CM | POA: Diagnosis not present

## 2023-06-08 DIAGNOSIS — R269 Unspecified abnormalities of gait and mobility: Secondary | ICD-10-CM | POA: Diagnosis not present

## 2023-06-08 DIAGNOSIS — R41841 Cognitive communication deficit: Secondary | ICD-10-CM | POA: Diagnosis not present

## 2023-06-09 DIAGNOSIS — Z9181 History of falling: Secondary | ICD-10-CM | POA: Diagnosis not present

## 2023-06-09 DIAGNOSIS — M6281 Muscle weakness (generalized): Secondary | ICD-10-CM | POA: Diagnosis not present

## 2023-06-09 DIAGNOSIS — Z4789 Encounter for other orthopedic aftercare: Secondary | ICD-10-CM | POA: Diagnosis not present

## 2023-06-10 DIAGNOSIS — R41841 Cognitive communication deficit: Secondary | ICD-10-CM | POA: Diagnosis not present

## 2023-06-10 DIAGNOSIS — Z4789 Encounter for other orthopedic aftercare: Secondary | ICD-10-CM | POA: Diagnosis not present

## 2023-06-10 DIAGNOSIS — M6281 Muscle weakness (generalized): Secondary | ICD-10-CM | POA: Diagnosis not present

## 2023-06-10 DIAGNOSIS — R269 Unspecified abnormalities of gait and mobility: Secondary | ICD-10-CM | POA: Diagnosis not present

## 2023-06-10 DIAGNOSIS — S7291XA Unspecified fracture of right femur, initial encounter for closed fracture: Secondary | ICD-10-CM | POA: Diagnosis not present

## 2023-06-11 DIAGNOSIS — Z9181 History of falling: Secondary | ICD-10-CM | POA: Diagnosis not present

## 2023-06-11 DIAGNOSIS — M6281 Muscle weakness (generalized): Secondary | ICD-10-CM | POA: Diagnosis not present

## 2023-06-11 DIAGNOSIS — Z4789 Encounter for other orthopedic aftercare: Secondary | ICD-10-CM | POA: Diagnosis not present

## 2023-06-12 DIAGNOSIS — S7291XA Unspecified fracture of right femur, initial encounter for closed fracture: Secondary | ICD-10-CM | POA: Diagnosis not present

## 2023-06-12 DIAGNOSIS — R41841 Cognitive communication deficit: Secondary | ICD-10-CM | POA: Diagnosis not present

## 2023-06-14 DIAGNOSIS — Z4789 Encounter for other orthopedic aftercare: Secondary | ICD-10-CM | POA: Diagnosis not present

## 2023-06-14 DIAGNOSIS — R269 Unspecified abnormalities of gait and mobility: Secondary | ICD-10-CM | POA: Diagnosis not present

## 2023-06-14 DIAGNOSIS — M6281 Muscle weakness (generalized): Secondary | ICD-10-CM | POA: Diagnosis not present

## 2023-06-15 DIAGNOSIS — Z4789 Encounter for other orthopedic aftercare: Secondary | ICD-10-CM | POA: Diagnosis not present

## 2023-06-15 DIAGNOSIS — R269 Unspecified abnormalities of gait and mobility: Secondary | ICD-10-CM | POA: Diagnosis not present

## 2023-06-15 DIAGNOSIS — M6281 Muscle weakness (generalized): Secondary | ICD-10-CM | POA: Diagnosis not present

## 2023-06-15 DIAGNOSIS — S7291XA Unspecified fracture of right femur, initial encounter for closed fracture: Secondary | ICD-10-CM | POA: Diagnosis not present

## 2023-06-15 DIAGNOSIS — R41841 Cognitive communication deficit: Secondary | ICD-10-CM | POA: Diagnosis not present

## 2023-06-16 DIAGNOSIS — M6281 Muscle weakness (generalized): Secondary | ICD-10-CM | POA: Diagnosis not present

## 2023-06-16 DIAGNOSIS — Z9181 History of falling: Secondary | ICD-10-CM | POA: Diagnosis not present

## 2023-06-16 DIAGNOSIS — Z4789 Encounter for other orthopedic aftercare: Secondary | ICD-10-CM | POA: Diagnosis not present

## 2023-06-17 DIAGNOSIS — Z9181 History of falling: Secondary | ICD-10-CM | POA: Diagnosis not present

## 2023-06-17 DIAGNOSIS — R269 Unspecified abnormalities of gait and mobility: Secondary | ICD-10-CM | POA: Diagnosis not present

## 2023-06-17 DIAGNOSIS — M6281 Muscle weakness (generalized): Secondary | ICD-10-CM | POA: Diagnosis not present

## 2023-06-17 DIAGNOSIS — Z4789 Encounter for other orthopedic aftercare: Secondary | ICD-10-CM | POA: Diagnosis not present

## 2023-06-17 DIAGNOSIS — R41841 Cognitive communication deficit: Secondary | ICD-10-CM | POA: Diagnosis not present

## 2023-06-17 DIAGNOSIS — S7291XA Unspecified fracture of right femur, initial encounter for closed fracture: Secondary | ICD-10-CM | POA: Diagnosis not present

## 2023-06-18 DIAGNOSIS — Z4789 Encounter for other orthopedic aftercare: Secondary | ICD-10-CM | POA: Diagnosis not present

## 2023-06-18 DIAGNOSIS — M6281 Muscle weakness (generalized): Secondary | ICD-10-CM | POA: Diagnosis not present

## 2023-06-18 DIAGNOSIS — Z9181 History of falling: Secondary | ICD-10-CM | POA: Diagnosis not present

## 2023-06-19 DIAGNOSIS — S7291XA Unspecified fracture of right femur, initial encounter for closed fracture: Secondary | ICD-10-CM | POA: Diagnosis not present

## 2023-06-19 DIAGNOSIS — Z4789 Encounter for other orthopedic aftercare: Secondary | ICD-10-CM | POA: Diagnosis not present

## 2023-06-19 DIAGNOSIS — R269 Unspecified abnormalities of gait and mobility: Secondary | ICD-10-CM | POA: Diagnosis not present

## 2023-06-19 DIAGNOSIS — M6281 Muscle weakness (generalized): Secondary | ICD-10-CM | POA: Diagnosis not present

## 2023-06-19 DIAGNOSIS — R41841 Cognitive communication deficit: Secondary | ICD-10-CM | POA: Diagnosis not present

## 2023-06-19 NOTE — Progress Notes (Signed)
 I agree with the history, exam, documentation and plan as outlined by Butch Penny, NP

## 2023-06-22 ENCOUNTER — Encounter: Payer: Medicare Other | Admitting: Pharmacist

## 2023-06-22 DIAGNOSIS — R269 Unspecified abnormalities of gait and mobility: Secondary | ICD-10-CM | POA: Diagnosis not present

## 2023-06-22 DIAGNOSIS — R41841 Cognitive communication deficit: Secondary | ICD-10-CM | POA: Diagnosis not present

## 2023-06-22 DIAGNOSIS — S7291XA Unspecified fracture of right femur, initial encounter for closed fracture: Secondary | ICD-10-CM | POA: Diagnosis not present

## 2023-06-22 DIAGNOSIS — Z4789 Encounter for other orthopedic aftercare: Secondary | ICD-10-CM | POA: Diagnosis not present

## 2023-06-22 DIAGNOSIS — M6281 Muscle weakness (generalized): Secondary | ICD-10-CM | POA: Diagnosis not present

## 2023-06-23 DIAGNOSIS — Z9181 History of falling: Secondary | ICD-10-CM | POA: Diagnosis not present

## 2023-06-23 DIAGNOSIS — Z4789 Encounter for other orthopedic aftercare: Secondary | ICD-10-CM | POA: Diagnosis not present

## 2023-06-23 DIAGNOSIS — M6281 Muscle weakness (generalized): Secondary | ICD-10-CM | POA: Diagnosis not present

## 2023-06-24 DIAGNOSIS — M6281 Muscle weakness (generalized): Secondary | ICD-10-CM | POA: Diagnosis not present

## 2023-06-24 DIAGNOSIS — Z9181 History of falling: Secondary | ICD-10-CM | POA: Diagnosis not present

## 2023-06-24 DIAGNOSIS — S7291XA Unspecified fracture of right femur, initial encounter for closed fracture: Secondary | ICD-10-CM | POA: Diagnosis not present

## 2023-06-24 DIAGNOSIS — R41841 Cognitive communication deficit: Secondary | ICD-10-CM | POA: Diagnosis not present

## 2023-06-24 DIAGNOSIS — R269 Unspecified abnormalities of gait and mobility: Secondary | ICD-10-CM | POA: Diagnosis not present

## 2023-06-24 DIAGNOSIS — Z4789 Encounter for other orthopedic aftercare: Secondary | ICD-10-CM | POA: Diagnosis not present

## 2023-06-25 DIAGNOSIS — Z9181 History of falling: Secondary | ICD-10-CM | POA: Diagnosis not present

## 2023-06-25 DIAGNOSIS — Z4789 Encounter for other orthopedic aftercare: Secondary | ICD-10-CM | POA: Diagnosis not present

## 2023-06-25 DIAGNOSIS — R269 Unspecified abnormalities of gait and mobility: Secondary | ICD-10-CM | POA: Diagnosis not present

## 2023-06-25 DIAGNOSIS — M6281 Muscle weakness (generalized): Secondary | ICD-10-CM | POA: Diagnosis not present

## 2023-06-26 DIAGNOSIS — M6281 Muscle weakness (generalized): Secondary | ICD-10-CM | POA: Diagnosis not present

## 2023-06-26 DIAGNOSIS — Z9181 History of falling: Secondary | ICD-10-CM | POA: Diagnosis not present

## 2023-06-26 DIAGNOSIS — S7291XA Unspecified fracture of right femur, initial encounter for closed fracture: Secondary | ICD-10-CM | POA: Diagnosis not present

## 2023-06-26 DIAGNOSIS — H34831 Tributary (branch) retinal vein occlusion, right eye, with macular edema: Secondary | ICD-10-CM | POA: Diagnosis not present

## 2023-06-26 DIAGNOSIS — R41841 Cognitive communication deficit: Secondary | ICD-10-CM | POA: Diagnosis not present

## 2023-06-26 DIAGNOSIS — Z4789 Encounter for other orthopedic aftercare: Secondary | ICD-10-CM | POA: Diagnosis not present

## 2023-06-29 DIAGNOSIS — M6281 Muscle weakness (generalized): Secondary | ICD-10-CM | POA: Diagnosis not present

## 2023-06-29 DIAGNOSIS — R41841 Cognitive communication deficit: Secondary | ICD-10-CM | POA: Diagnosis not present

## 2023-06-29 DIAGNOSIS — R269 Unspecified abnormalities of gait and mobility: Secondary | ICD-10-CM | POA: Diagnosis not present

## 2023-06-29 DIAGNOSIS — S7291XA Unspecified fracture of right femur, initial encounter for closed fracture: Secondary | ICD-10-CM | POA: Diagnosis not present

## 2023-06-29 DIAGNOSIS — Z4789 Encounter for other orthopedic aftercare: Secondary | ICD-10-CM | POA: Diagnosis not present

## 2023-07-01 DIAGNOSIS — R269 Unspecified abnormalities of gait and mobility: Secondary | ICD-10-CM | POA: Diagnosis not present

## 2023-07-01 DIAGNOSIS — M6281 Muscle weakness (generalized): Secondary | ICD-10-CM | POA: Diagnosis not present

## 2023-07-01 DIAGNOSIS — Z9181 History of falling: Secondary | ICD-10-CM | POA: Diagnosis not present

## 2023-07-01 DIAGNOSIS — S7291XA Unspecified fracture of right femur, initial encounter for closed fracture: Secondary | ICD-10-CM | POA: Diagnosis not present

## 2023-07-01 DIAGNOSIS — R41841 Cognitive communication deficit: Secondary | ICD-10-CM | POA: Diagnosis not present

## 2023-07-01 DIAGNOSIS — Z4789 Encounter for other orthopedic aftercare: Secondary | ICD-10-CM | POA: Diagnosis not present

## 2023-07-02 DIAGNOSIS — M6281 Muscle weakness (generalized): Secondary | ICD-10-CM | POA: Diagnosis not present

## 2023-07-02 DIAGNOSIS — Z9181 History of falling: Secondary | ICD-10-CM | POA: Diagnosis not present

## 2023-07-02 DIAGNOSIS — Z4789 Encounter for other orthopedic aftercare: Secondary | ICD-10-CM | POA: Diagnosis not present

## 2023-07-03 DIAGNOSIS — M6281 Muscle weakness (generalized): Secondary | ICD-10-CM | POA: Diagnosis not present

## 2023-07-03 DIAGNOSIS — R41841 Cognitive communication deficit: Secondary | ICD-10-CM | POA: Diagnosis not present

## 2023-07-03 DIAGNOSIS — R269 Unspecified abnormalities of gait and mobility: Secondary | ICD-10-CM | POA: Diagnosis not present

## 2023-07-03 DIAGNOSIS — Z4789 Encounter for other orthopedic aftercare: Secondary | ICD-10-CM | POA: Diagnosis not present

## 2023-07-03 DIAGNOSIS — S7291XA Unspecified fracture of right femur, initial encounter for closed fracture: Secondary | ICD-10-CM | POA: Diagnosis not present

## 2023-07-06 DIAGNOSIS — Z4789 Encounter for other orthopedic aftercare: Secondary | ICD-10-CM | POA: Diagnosis not present

## 2023-07-06 DIAGNOSIS — S7291XA Unspecified fracture of right femur, initial encounter for closed fracture: Secondary | ICD-10-CM | POA: Diagnosis not present

## 2023-07-06 DIAGNOSIS — M6281 Muscle weakness (generalized): Secondary | ICD-10-CM | POA: Diagnosis not present

## 2023-07-06 DIAGNOSIS — Z9181 History of falling: Secondary | ICD-10-CM | POA: Diagnosis not present

## 2023-07-06 DIAGNOSIS — R41841 Cognitive communication deficit: Secondary | ICD-10-CM | POA: Diagnosis not present

## 2023-07-06 DIAGNOSIS — R269 Unspecified abnormalities of gait and mobility: Secondary | ICD-10-CM | POA: Diagnosis not present

## 2023-07-08 ENCOUNTER — Encounter: Payer: Medicare Other | Admitting: Family Medicine

## 2023-07-08 DIAGNOSIS — Z4789 Encounter for other orthopedic aftercare: Secondary | ICD-10-CM | POA: Diagnosis not present

## 2023-07-08 DIAGNOSIS — M6281 Muscle weakness (generalized): Secondary | ICD-10-CM | POA: Diagnosis not present

## 2023-07-08 DIAGNOSIS — R269 Unspecified abnormalities of gait and mobility: Secondary | ICD-10-CM | POA: Diagnosis not present

## 2023-07-08 DIAGNOSIS — Z9181 History of falling: Secondary | ICD-10-CM | POA: Diagnosis not present

## 2023-07-08 DIAGNOSIS — R41841 Cognitive communication deficit: Secondary | ICD-10-CM | POA: Diagnosis not present

## 2023-07-08 DIAGNOSIS — S7291XA Unspecified fracture of right femur, initial encounter for closed fracture: Secondary | ICD-10-CM | POA: Diagnosis not present

## 2023-07-09 DIAGNOSIS — Z4789 Encounter for other orthopedic aftercare: Secondary | ICD-10-CM | POA: Diagnosis not present

## 2023-07-09 DIAGNOSIS — Z9181 History of falling: Secondary | ICD-10-CM | POA: Diagnosis not present

## 2023-07-09 DIAGNOSIS — M6281 Muscle weakness (generalized): Secondary | ICD-10-CM | POA: Diagnosis not present

## 2023-07-10 DIAGNOSIS — Z4789 Encounter for other orthopedic aftercare: Secondary | ICD-10-CM | POA: Diagnosis not present

## 2023-07-10 DIAGNOSIS — R41841 Cognitive communication deficit: Secondary | ICD-10-CM | POA: Diagnosis not present

## 2023-07-10 DIAGNOSIS — R269 Unspecified abnormalities of gait and mobility: Secondary | ICD-10-CM | POA: Diagnosis not present

## 2023-07-10 DIAGNOSIS — S7291XA Unspecified fracture of right femur, initial encounter for closed fracture: Secondary | ICD-10-CM | POA: Diagnosis not present

## 2023-07-10 DIAGNOSIS — M6281 Muscle weakness (generalized): Secondary | ICD-10-CM | POA: Diagnosis not present

## 2023-07-13 DIAGNOSIS — R269 Unspecified abnormalities of gait and mobility: Secondary | ICD-10-CM | POA: Diagnosis not present

## 2023-07-13 DIAGNOSIS — M6281 Muscle weakness (generalized): Secondary | ICD-10-CM | POA: Diagnosis not present

## 2023-07-13 DIAGNOSIS — Z4789 Encounter for other orthopedic aftercare: Secondary | ICD-10-CM | POA: Diagnosis not present

## 2023-07-14 DIAGNOSIS — M6281 Muscle weakness (generalized): Secondary | ICD-10-CM | POA: Diagnosis not present

## 2023-07-14 DIAGNOSIS — Z9181 History of falling: Secondary | ICD-10-CM | POA: Diagnosis not present

## 2023-07-14 DIAGNOSIS — R269 Unspecified abnormalities of gait and mobility: Secondary | ICD-10-CM | POA: Diagnosis not present

## 2023-07-14 DIAGNOSIS — Z4789 Encounter for other orthopedic aftercare: Secondary | ICD-10-CM | POA: Diagnosis not present

## 2023-07-15 ENCOUNTER — Non-Acute Institutional Stay: Payer: Self-pay | Admitting: Orthopedic Surgery

## 2023-07-15 ENCOUNTER — Encounter: Payer: Self-pay | Admitting: Orthopedic Surgery

## 2023-07-15 DIAGNOSIS — R1111 Vomiting without nausea: Secondary | ICD-10-CM

## 2023-07-15 DIAGNOSIS — Z7189 Other specified counseling: Secondary | ICD-10-CM | POA: Diagnosis not present

## 2023-07-15 DIAGNOSIS — R41841 Cognitive communication deficit: Secondary | ICD-10-CM | POA: Diagnosis not present

## 2023-07-15 DIAGNOSIS — Z9181 History of falling: Secondary | ICD-10-CM | POA: Diagnosis not present

## 2023-07-15 DIAGNOSIS — Z4789 Encounter for other orthopedic aftercare: Secondary | ICD-10-CM | POA: Diagnosis not present

## 2023-07-15 DIAGNOSIS — S7291XA Unspecified fracture of right femur, initial encounter for closed fracture: Secondary | ICD-10-CM | POA: Diagnosis not present

## 2023-07-15 DIAGNOSIS — W19XXXA Unspecified fall, initial encounter: Secondary | ICD-10-CM | POA: Diagnosis not present

## 2023-07-15 DIAGNOSIS — M6281 Muscle weakness (generalized): Secondary | ICD-10-CM | POA: Diagnosis not present

## 2023-07-15 DIAGNOSIS — R269 Unspecified abnormalities of gait and mobility: Secondary | ICD-10-CM | POA: Diagnosis not present

## 2023-07-15 NOTE — Progress Notes (Signed)
 Location:  [facility] Room Number: 28 [id number] Place of Service:  ALF [phone]) Provider:  Ulyses Gandy, NP   Patient Care Team: Marguerite Shiley, MD as PCP - General (Internal Medicine) Loyde Rule, MD as PCP - Cardiology (Cardiology) Oris Birmingham, MD as Consulting Physician (Ophthalmology) Edmon Gosling, Burnie Cartwright, MD as Referring Physician (Ophthalmology) Debbra Fairy, MD as Consulting Physician (Neurology) Myrle Aspen, [facility] (Inactive) as Pharmacist (Pharmacist) Marguerite Shiley, MD as Consulting Physician (Internal Medicine)  Extended Emergency Contact Information Primary Emergency Contact: McGinnis,Eileen Mobile Phone: [phone] Relation: Other Secondary Emergency Contact: Heyge,Lorna Mobile Phone: [phone] Relation: Friend Interpreter needed? No  Code Status: Full Code Goals of care: Advanced Directive information    05/18/2023    2:55 PM  Advanced Directives  Does Patient Have a Medical Advance Directive? Yes  Type of Estate agent of [place];Living will  Does patient want to make changes to medical advance directive? No - Patient declined  Copy of Healthcare Power of Attorney in Chart? Yes - validated most recent copy scanned in chart (See row information)     Chief Complaint  Patient presents with   Acute Visit    HPI:  Pt is a 89 y.o. female seen today for an acute visit due to recent fall.   She currently resides on the skilled nursing unit at [facility]. PMH: left MCA stroke 2015, TIA, atrial fib, MVR, HTN, HLD, Raynaud's, hypothyroidism, s/p right hip hemiarthroplasty 03/05/2023, macular degeneration, GERD, CKD, and depression.   04/27 she slipped out of chair in room. No head injury. She denies pain. WBAT. She ambulates with walker.   Last night she reports dizziness and vomiting episode after eating olives at dinner. She denies dysphagia or sickness. She admits to feeling better throwing up. She is  followed by ST. They have been updated per nursing. H/o CVA with last one being embolic stroke of left MCA/PCA 01/20. Remains on regular diet with thin liquids.   Discussed DNR code status with patient. She would like to be a DNR. She would also like her husband to be a DNR. They are followed by a Attorney who is also HPOA. I had called last month to discuss patient wishes. No return call.    Past Medical History:  Diagnosis Date   Atrial fibrillation (HCC)    AFIB   Collagenous colitis    CVA (cerebral vascular accident) (HCC)    a.  L parietal CVA by MRI 12/12, likely embolic;  b.  TEE by report with neg bubble study;  c.  carotid dopplers  12/12:  + plaque, no sig ICA stenosis    History of postmenopausal HRT    Hx of adenomatous colonic polyps    Hypothyroidism    MVP (mitral valve prolapse)    a. s/p MV repair at [facility] in 2008;  b. Echocardiogram 7/12: EF 50%, status post mitral valve repair with mild MR and minimal MS, mean gradient 4, moderate LAE, LA diam 55 mm; mild RAE, PASP 28-32;    Osteoarthritis    Osteoporosis    Past Surgical History:  Procedure Laterality Date   ANTERIOR APPROACH HEMI HIP ARTHROPLASTY Right 03/05/2023   Procedure: ANTERIOR APPROACH HEMI HIP ARTHROPLASTY;  Surgeon: Adonica Hoose, MD;  Location: [facility] ORS;  Service: Orthopedics;  Laterality: Right;   CARDIOVERSION  01/09/2012   Procedure: CARDIOVERSION;  Surgeon: Mardell Shade, MD;  Location: [facility] ENDOSCOPY;  Service: Cardiovascular;  Laterality: N/A;   CATARACT  EXTRACTION     CHOLECYSTECTOMY     DILATION AND CURETTAGE OF UTERUS     LUMBAR FUSION     MITRAL VALVE REPAIR  03/17/2006   "mitral valve repair CE ring and maze procedure"   TONSILLECTOMY      Allergies  Allergen Reactions   Tape Other (See Comments)    Skin tears and bruises easily.   Vioxx [Rofecoxib] Other (See Comments)    Elevated LFT's    Outpatient Encounter Medications as of 07/15/2023  Medication Sig   amoxicillin  (AMOXIL )  500 MG tablet Take 2,000 mg by mouth daily at 12 noon.   apixaban  (ELIQUIS ) 2.5 MG TABS tablet Take 1 tablet (2.5 mg total) by mouth 2 (two) times daily.   Calcium  Citrate-Vitamin D  (CITRACAL PETITES/VITAMIN D ) 200-6.25 MG-MCG TABS Take 1 tablet by mouth 2 (two) times daily.   docusate sodium  (COLACE) 100 MG capsule Take 1 capsule (100 mg total) by mouth 2 (two) times daily.   escitalopram  (LEXAPRO ) 20 MG tablet Take 20 mg by mouth daily.   fluticasone  (FLONASE ) 50 MCG/ACT nasal spray Place 2 sprays into both nostrils daily.   glucosamine-chondroitin 500-400 MG tablet Take 1 tablet by mouth 2 (two) times daily.   lacosamide  (VIMPAT ) 50 MG TABS tablet Take 1 tablet (50 mg total) by mouth 2 (two) times daily.   lactose free nutrition (BOOST) LIQD Take 237 mLs by mouth 2 (two) times daily between meals.   levothyroxine  (SYNTHROID ) 75 MCG tablet TAKE 1 TABLET BY MOUTH EVERY DAY BEFORE BREAKFAST   loratadine  (CLARITIN ) 10 MG tablet Take 1 tablet (10 mg total) by mouth daily.   metoprolol  tartrate (LOPRESSOR ) 25 MG tablet Take 1 tablet (25 mg total) by mouth 2 (two) times daily.   Multiple Vitamins-Minerals (PRESERVISION AREDS 2 PO) Take 1 capsule by mouth 2 (two) times daily. Reported on 09/04/2015   polyethylene glycol (MIRALAX  / GLYCOLAX ) 17 g packet Take 17 g by mouth daily as needed for mild constipation.   rosuvastatin  (CRESTOR ) 10 MG tablet Take 10 mg by mouth once. 10 mg by mouth in the evening for HLD   Saline (SIMPLY SALINE) 0.9 % AERS Place 2 each into the nose daily as needed.   TYLENOL  8 HOUR ARTHRITIS PAIN 650 MG CR tablet Take 650 mg by mouth daily.   vitamin A 10000 UNIT capsule Take 1 capsule by mouth daily.   zinc  oxide 20 % ointment Apply 1 Application topically See admin instructions. Apply topically to peri/buttocks as needed after each incontinent episode for skin protection.   guaiFENesin (MUCINEX) 600 MG 12 hr tablet Take 600 mg by mouth 2 (two) times daily. (Patient not taking:  Reported on 07/15/2023)   No facility-administered encounter medications on file as of 07/15/2023.    Review of Systems  Constitutional:  Negative for fatigue and fever.  HENT:  Negative for sore throat and trouble swallowing.   Respiratory:  Negative for cough and shortness of breath.   Cardiovascular:  Negative for chest pain and leg swelling.  Gastrointestinal:  Positive for vomiting. Negative for abdominal distention, abdominal pain, constipation, diarrhea and nausea.  Genitourinary:  Negative for dysuria and hematuria.  Musculoskeletal:  Positive for gait problem. Negative for arthralgias.  Skin:  Negative for wound.  Neurological:  Positive for dizziness and weakness. Negative for light-headedness.  Psychiatric/Behavioral:  Positive for confusion. Negative for dysphoric mood and sleep disturbance. The patient is not nervous/anxious.     Immunization History  Administered Date(s) Administered  Fluad Quad(high Dose 65+) 11/30/2018, 01/17/2020   Fluad Trivalent(High Dose 65+) 03/02/2023   Influenza Split 12/17/2010, 12/02/2011   Influenza Whole 03/17/2001, 12/18/2006, 12/28/2007, 01/03/2009, 12/26/2009   Influenza, High Dose Seasonal PF 12/22/2012, 03/06/2015, 11/26/2015, 12/09/2017, 11/17/2022   Influenza,inj,Quad PF,6+ Mos 11/23/2013   Influenza-Unspecified 11/20/2016   PFIZER(Purple Top)SARS-COV-2 Vaccination 06/09/2019, 06/30/2019, 02/16/2020   Pneumococcal Conjugate-13 03/02/2014   Pneumococcal Polysaccharide-23 03/17/2000, 09/16/2006   Td 03/18/1995, 09/15/2006, 02/22/2021   Tdap 12/17/2010   Unspecified SARS-COV-2 Vaccination 12/08/2020   Zoster Recombinant(Shingrix) 09/18/2016, 11/20/2016   Pertinent  Health Maintenance Due  Topic Date Due   INFLUENZA VACCINE  10/16/2023   DEXA SCAN  Completed      11/25/2021   10:46 AM 02/11/2022   11:26 AM 05/26/2022   11:09 AM 03/02/2023    3:17 PM 05/18/2023   11:29 AM  Fall Risk  Falls in the past year? 0 1 1 1 1   Was there  an injury with Fall? 0 1 1 0 1  Was there an injury with Fall? - Comments  fractured rib     Fall Risk Category Calculator 0 3 3 2 2   Fall Risk Category (Retired) Low High     (RETIRED) Patient Fall Risk Level Low fall risk Low fall risk     Patient at Risk for Falls Due to No Fall Risks No Fall Risks History of fall(s) Impaired balance/gait;Impaired mobility;History of fall(s) History of fall(s);Impaired balance/gait;Impaired mobility  Fall risk Follow up Falls evaluation completed Falls evaluation completed Falls evaluation completed Falls prevention discussed Falls evaluation completed;Education provided   Functional Status Survey:    Vitals:   07/15/23 1023  BP: 139/79  Pulse: 84  Resp: 16  Temp: 98 F (36.7 C)  SpO2: 98%  Weight: 95 lb (43.1 kg)  Height: 5' (1.524 m)   Body mass index is 18.55 kg/m. Physical Exam Vitals reviewed.  Constitutional:      General: She is not in acute distress. HENT:     Head: Normocephalic and atraumatic.     Right Ear: External ear normal.     Left Ear: External ear normal.     Nose: Nose normal.     Mouth/Throat:     Mouth: Mucous membranes are moist.  Eyes:     General:        Right eye: No discharge.        Left eye: No discharge.     Extraocular Movements: Extraocular movements intact.     Pupils: Pupils are equal, round, and reactive to light.  Cardiovascular:     Rate and Rhythm: Normal rate. Rhythm irregular.     Pulses: Normal pulses.     Heart sounds: Normal heart sounds.  Pulmonary:     Effort: Pulmonary effort is normal.     Breath sounds: Normal breath sounds.  Abdominal:     General: Bowel sounds are normal.     Palpations: Abdomen is soft.  Musculoskeletal:     Cervical back: Neck supple.     Right lower leg: No edema.     Left lower leg: No edema.  Skin:    General: Skin is warm.     Capillary Refill: Capillary refill takes less than 2 seconds.  Neurological:     General: No focal deficit present.      Mental Status: She is alert and oriented to person, place, and time.     Motor: Weakness present.     Gait: Gait abnormal.  Psychiatric:  Mood and Affect: Mood normal.     Labs reviewed: Recent Labs    03/06/23 0337 03/07/23 0352 03/08/23 0352 04/06/23 1326 04/08/23 0438 04/08/23 0439 05/14/23 0948 05/14/23 0954  NA 130* 133* 139 137 140  --  139 139  K 4.0 4.6 4.4 5.0 3.6  --  4.3 4.3  CL 97* 98 103 101 105  --  102 103  CO2 26 29 30 26 23   --  26  --   GLUCOSE 154* 131* 95 98 89  --  110* 103*  BUN 25* 34* 27* 25* 24*  --  21 22  CREATININE 0.93 0.89 0.64 0.94 0.89  --  0.93 1.00  CALCIUM  7.7* 8.5* 8.6* 9.3 8.6*  --  9.3  --   MG  --  2.1 2.2  --   --  2.0  --   --   PHOS 3.1 1.6* 3.1  --   --   --   --   --    Recent Labs    03/03/23 2200 03/05/23 0748 03/08/23 0352 04/06/23 1326 05/14/23 0948  AST 29  --   --  33 29  ALT 42  --   --  28 25  ALKPHOS 81  --   --  159* 127*  BILITOT 0.7  --   --  0.7 0.9  PROT 5.9*  --   --  6.2* 6.5  ALBUMIN 3.6   < > 2.8* 3.6 3.8   < > = values in this interval not displayed.   Recent Labs    03/03/23 2124 03/05/23 0748 03/08/23 0352 04/06/23 1326 05/14/23 0948 05/14/23 0954  WBC 10.9*   < > 9.5 10.5 9.3  --   NEUTROABS 9.0*  --   --  8.0* 7.2  --   HGB 13.4   < > 11.4* 11.9* 12.7 12.9  HCT 41.0   < > 34.7* 37.9 39.8 38.0  MCV 102.2*   < > 99.7 101.6* 97.8  --   PLT 150   < > 167 191 173  --    < > = values in this interval not displayed.   Lab Results  Component Value Date   TSH 1.862 04/08/2023   Lab Results  Component Value Date   HGBA1C 5.5 03/06/2023   Lab Results  Component Value Date   CHOL 132 04/07/2023   HDL 66 04/07/2023   LDLCALC 57 04/07/2023   LDLDIRECT 47.0 09/04/2015   TRIG 43 04/07/2023   CHOLHDL 2.0 04/07/2023    Significant Diagnostic Results in last 30 days:  No results found.  Assessment/Plan 1. Fall, initial encounter (Primary) - fall 04/27> slipped out of chair - no  apparent injury, WBAT - neuro checks normal - cont falls safety precautions  2. Vomiting without nausea, unspecified vomiting type - one episode last night with dinner> ? Eating olives - no nausea, admitted to dizziness  - exam unremarkable - followed by ST  3. Advanced directives, counseling/discussion - she wants to be DNR - message left with HPOA> second attempt    Family/ staff Communication: plan discussed with patient and nurse  Labs/tests ordered:  none

## 2023-07-16 ENCOUNTER — Other Ambulatory Visit: Payer: Self-pay | Admitting: Internal Medicine

## 2023-07-16 DIAGNOSIS — Z4789 Encounter for other orthopedic aftercare: Secondary | ICD-10-CM | POA: Diagnosis not present

## 2023-07-16 DIAGNOSIS — M6281 Muscle weakness (generalized): Secondary | ICD-10-CM | POA: Diagnosis not present

## 2023-07-16 DIAGNOSIS — Z9181 History of falling: Secondary | ICD-10-CM | POA: Diagnosis not present

## 2023-07-16 MED ORDER — LACOSAMIDE 50 MG PO TABS: 50.0000 mg | ORAL_TABLET | Freq: Two times a day (BID) | ORAL | 2 refills | Status: DC

## 2023-07-17 DIAGNOSIS — R41841 Cognitive communication deficit: Secondary | ICD-10-CM | POA: Diagnosis not present

## 2023-07-17 DIAGNOSIS — S7291XA Unspecified fracture of right femur, initial encounter for closed fracture: Secondary | ICD-10-CM | POA: Diagnosis not present

## 2023-07-20 DIAGNOSIS — R41841 Cognitive communication deficit: Secondary | ICD-10-CM | POA: Diagnosis not present

## 2023-07-20 DIAGNOSIS — S7291XA Unspecified fracture of right femur, initial encounter for closed fracture: Secondary | ICD-10-CM | POA: Diagnosis not present

## 2023-07-20 DIAGNOSIS — Z4789 Encounter for other orthopedic aftercare: Secondary | ICD-10-CM | POA: Diagnosis not present

## 2023-07-20 DIAGNOSIS — M6281 Muscle weakness (generalized): Secondary | ICD-10-CM | POA: Diagnosis not present

## 2023-07-20 DIAGNOSIS — R269 Unspecified abnormalities of gait and mobility: Secondary | ICD-10-CM | POA: Diagnosis not present

## 2023-07-21 DIAGNOSIS — S7291XA Unspecified fracture of right femur, initial encounter for closed fracture: Secondary | ICD-10-CM | POA: Diagnosis not present

## 2023-07-21 DIAGNOSIS — M6281 Muscle weakness (generalized): Secondary | ICD-10-CM | POA: Diagnosis not present

## 2023-07-21 DIAGNOSIS — R41841 Cognitive communication deficit: Secondary | ICD-10-CM | POA: Diagnosis not present

## 2023-07-21 DIAGNOSIS — Z9181 History of falling: Secondary | ICD-10-CM | POA: Diagnosis not present

## 2023-07-21 DIAGNOSIS — Z4789 Encounter for other orthopedic aftercare: Secondary | ICD-10-CM | POA: Diagnosis not present

## 2023-07-22 DIAGNOSIS — Z9181 History of falling: Secondary | ICD-10-CM | POA: Diagnosis not present

## 2023-07-22 DIAGNOSIS — R41841 Cognitive communication deficit: Secondary | ICD-10-CM | POA: Diagnosis not present

## 2023-07-22 DIAGNOSIS — S7291XA Unspecified fracture of right femur, initial encounter for closed fracture: Secondary | ICD-10-CM | POA: Diagnosis not present

## 2023-07-22 DIAGNOSIS — Z4789 Encounter for other orthopedic aftercare: Secondary | ICD-10-CM | POA: Diagnosis not present

## 2023-07-22 DIAGNOSIS — M6281 Muscle weakness (generalized): Secondary | ICD-10-CM | POA: Diagnosis not present

## 2023-07-23 DIAGNOSIS — M6281 Muscle weakness (generalized): Secondary | ICD-10-CM | POA: Diagnosis not present

## 2023-07-23 DIAGNOSIS — Z9181 History of falling: Secondary | ICD-10-CM | POA: Diagnosis not present

## 2023-07-23 DIAGNOSIS — R269 Unspecified abnormalities of gait and mobility: Secondary | ICD-10-CM | POA: Diagnosis not present

## 2023-07-23 DIAGNOSIS — Z4789 Encounter for other orthopedic aftercare: Secondary | ICD-10-CM | POA: Diagnosis not present

## 2023-07-24 DIAGNOSIS — Z4789 Encounter for other orthopedic aftercare: Secondary | ICD-10-CM | POA: Diagnosis not present

## 2023-07-24 DIAGNOSIS — M6281 Muscle weakness (generalized): Secondary | ICD-10-CM | POA: Diagnosis not present

## 2023-07-24 DIAGNOSIS — H34831 Tributary (branch) retinal vein occlusion, right eye, with macular edema: Secondary | ICD-10-CM | POA: Diagnosis not present

## 2023-07-24 DIAGNOSIS — R269 Unspecified abnormalities of gait and mobility: Secondary | ICD-10-CM | POA: Diagnosis not present

## 2023-07-27 DIAGNOSIS — R41841 Cognitive communication deficit: Secondary | ICD-10-CM | POA: Diagnosis not present

## 2023-07-27 DIAGNOSIS — R269 Unspecified abnormalities of gait and mobility: Secondary | ICD-10-CM | POA: Diagnosis not present

## 2023-07-27 DIAGNOSIS — S7291XA Unspecified fracture of right femur, initial encounter for closed fracture: Secondary | ICD-10-CM | POA: Diagnosis not present

## 2023-07-27 DIAGNOSIS — Z4789 Encounter for other orthopedic aftercare: Secondary | ICD-10-CM | POA: Diagnosis not present

## 2023-07-27 DIAGNOSIS — M6281 Muscle weakness (generalized): Secondary | ICD-10-CM | POA: Diagnosis not present

## 2023-07-28 ENCOUNTER — Other Ambulatory Visit: Payer: Self-pay

## 2023-07-28 ENCOUNTER — Encounter (HOSPITAL_COMMUNITY): Payer: Self-pay | Admitting: Emergency Medicine

## 2023-07-28 ENCOUNTER — Emergency Department (HOSPITAL_COMMUNITY)

## 2023-07-28 ENCOUNTER — Non-Acute Institutional Stay: Payer: Self-pay | Admitting: Adult Health

## 2023-07-28 ENCOUNTER — Emergency Department (HOSPITAL_COMMUNITY)
Admission: EM | Admit: 2023-07-28 | Discharge: 2023-07-28 | Disposition: A | Discharge status: Home / Self Care | Attending: Emergency Medicine | Admitting: Emergency Medicine

## 2023-07-28 ENCOUNTER — Encounter: Payer: Self-pay | Admitting: Adult Health

## 2023-07-28 DIAGNOSIS — R55 Syncope and collapse: Secondary | ICD-10-CM | POA: Diagnosis not present

## 2023-07-28 DIAGNOSIS — I482 Chronic atrial fibrillation, unspecified: Secondary | ICD-10-CM | POA: Diagnosis not present

## 2023-07-28 DIAGNOSIS — R4182 Altered mental status, unspecified: Secondary | ICD-10-CM | POA: Diagnosis not present

## 2023-07-28 DIAGNOSIS — Z79899 Other long term (current) drug therapy: Secondary | ICD-10-CM | POA: Insufficient documentation

## 2023-07-28 DIAGNOSIS — Z7901 Long term (current) use of anticoagulants: Secondary | ICD-10-CM | POA: Insufficient documentation

## 2023-07-28 DIAGNOSIS — E034 Atrophy of thyroid (acquired): Secondary | ICD-10-CM

## 2023-07-28 DIAGNOSIS — I4891 Unspecified atrial fibrillation: Secondary | ICD-10-CM | POA: Diagnosis not present

## 2023-07-28 DIAGNOSIS — F3342 Major depressive disorder, recurrent, in full remission: Secondary | ICD-10-CM

## 2023-07-28 DIAGNOSIS — Z8673 Personal history of transient ischemic attack (TIA), and cerebral infarction without residual deficits: Secondary | ICD-10-CM

## 2023-07-28 DIAGNOSIS — R0902 Hypoxemia: Secondary | ICD-10-CM | POA: Diagnosis not present

## 2023-07-28 DIAGNOSIS — N189 Chronic kidney disease, unspecified: Secondary | ICD-10-CM | POA: Insufficient documentation

## 2023-07-28 DIAGNOSIS — R569 Unspecified convulsions: Secondary | ICD-10-CM

## 2023-07-28 LAB — CBC
HCT: 38.4 % (ref 36.0–46.0)
Hemoglobin: 12.1 g/dL (ref 12.0–15.0)
MCH: 29.8 pg (ref 26.0–34.0)
MCHC: 31.5 g/dL (ref 30.0–36.0)
MCV: 94.6 fL (ref 80.0–100.0)
Platelets: 173 10*3/uL (ref 150–400)
RBC: 4.06 MIL/uL (ref 3.87–5.11)
RDW: 13.9 % (ref 11.5–15.5)
WBC: 6.9 10*3/uL (ref 4.0–10.5)
nRBC: 0 % (ref 0.0–0.2)

## 2023-07-28 LAB — BASIC METABOLIC PANEL WITH GFR
Anion gap: 6 (ref 5–15)
BUN: 24 mg/dL — ABNORMAL HIGH (ref 8–23)
CO2: 25 mmol/L (ref 22–32)
Calcium: 8.5 mg/dL — ABNORMAL LOW (ref 8.9–10.3)
Chloride: 104 mmol/L (ref 98–111)
Creatinine, Ser: 0.95 mg/dL (ref 0.44–1.00)
GFR, Estimated: 57 mL/min — ABNORMAL LOW (ref >60)
Glucose, Bld: 93 mg/dL (ref 70–99)
Potassium: 4.2 mmol/L (ref 3.5–5.1)
Sodium: 135 mmol/L (ref 135–145)

## 2023-07-28 NOTE — ED Triage Notes (Signed)
 BIBA from [facility] w/ c/o a focal seizure witnessed by staff at nursing facility. Pt. unresponsive initially with jerking in the right arm that lasted 3-5 mins. Pt. A&O x 4 on arrival. HX afib, seizures.   PER EMS: BP: 147/83 HR: 88 RR:18 CBG: 126 SPO2: 82%RA placed on O2 via nasal cannula @ 2 liters 99%

## 2023-07-28 NOTE — ED Provider Notes (Signed)
 [facility] EM[facility]GENCY DEPARTMENT AT [facility] Provider Note   CSN: [id number] Arrival date & time: 07/28/23  0710     History  Chief Complaint  Patient presents with   Seizures    Theresa [name] is a 89 y.o. female.  HPI 89 year old female history of stroke, seizures, paroxysmal atrial fibrillation, on chronic anticoagulation, CKD, presents today with reports that she had a witnessed focal seizure.  [facility] resides at [facility] [place].  They report that she had jerking in her right arm that lasted 5 minutes.  She was reported to initially be unresponsive.  She currently appears to be back to baseline.  She is alert and oriented on their arrival.  She has history of A-fib and seizures.  They report that initial oxygen saturations were 82% she was placed on oxygen at 2 L/min with sats increased to 99%. Patient states that she does not remember anything besides going to bed last night and waking up with EMS there today.  She denies any headache, injury, chest pain, nausea, vomiting, or diarrhea.  She states that her right leg hurts some initially but it has resolved since she was moved from the EMS stretcher. [facility] but no answer All the patient's friend Lenetta Quest who is her emergency contact.  She will come and pick her up.  She was not aware the patient was here      Home Medications Prior to Admission medications   Medication Sig Start Date End Date Taking? Authorizing Provider  amoxicillin  (AMOXIL ) 500 MG tablet Take 2,000 mg by mouth daily at 12 noon.    [provider]  apixaban  (ELIQUIS ) 2.5 MG TABS tablet Take 1 tablet (2.5 mg total) by mouth 2 (two) times daily. 04/08/23   Kraig Peru, MD  Calcium  Citrate-Vitamin D  (CITRACAL PETITES/VITAMIN D ) 200-6.25 MG-MCG TABS Take 1 tablet by mouth 2 (two) times daily.    [provider]  docusate sodium  (COLACE) 100 MG capsule Take 1 capsule (100 mg total) by mouth 2 (two)  times daily. 03/09/23   Rai, Hurman Maiden, MD  escitalopram  (LEXAPRO ) 20 MG tablet Take 20 mg by mouth daily.    [provider]  fluticasone  (FLONASE ) 50 MCG/ACT nasal spray Place 2 sprays into both nostrils daily. 02/11/22   Anthon Kins, MD  glucosamine-chondroitin 500-400 MG tablet Take 1 tablet by mouth 2 (two) times daily.    [provider]  guaiFENesin (MUCINEX) 600 MG 12 hr tablet Take 600 mg by mouth 2 (two) times daily. Patient not taking: Reported on 07/15/2023    [provider]  lacosamide  (VIMPAT ) 50 MG TABS tablet Take 1 tablet (50 mg total) by mouth 2 (two) times daily. 07/16/23   Marguerite Shiley, MD  lactose free nutrition (BOOST) LIQD Take 237 mLs by mouth 2 (two) times daily between meals.    [provider]  levothyroxine  (SYNTHROID ) 75 MCG tablet TAKE 1 TABLET BY MOUTH EVERY DAY BEFORE BREAKFAST 12/12/22   Almira Jaeger, MD  loratadine  (CLARITIN ) 10 MG tablet Take 1 tablet (10 mg total) by mouth daily. 02/11/22   Anthon Kins, MD  metoprolol  tartrate (LOPRESSOR ) 25 MG tablet Take 1 tablet (25 mg total) by mouth 2 (two) times daily. 03/09/23   Rai, Hurman Maiden, MD  Multiple Vitamins-Minerals (PRESERVISION AREDS 2 PO) Take 1 capsule by mouth 2 (two) times daily. Reported on 09/04/2015    [provider]  polyethylene glycol (MIRALAX  /  GLYCOLAX ) 17 g packet Take 17 g by mouth daily as needed for mild constipation. 03/09/23   Rai, Hurman Maiden, MD  rosuvastatin  (CRESTOR ) 10 MG tablet Take 10 mg by mouth once. 10 mg by mouth in the evening for HLD    [provider]  Saline (SIMPLY SALINE) 0.9 % AERS Place 2 each into the nose daily as needed. 02/11/22   Anthon Kins, MD  TYLENOL  8 HOUR ARTHRITIS PAIN 650 MG CR tablet Take 650 mg by mouth daily.    [provider]  vitamin A 10000 UNIT capsule Take 1 capsule by mouth daily.    [provider]  zinc  oxide 20 % ointment Apply 1 Application topically See  admin instructions. Apply topically to peri/buttocks as needed after each incontinent episode for skin protection.    [provider]      Allergies    Tape and Vioxx [rofecoxib]    Review of Systems   Review of Systems  Physical Exam Updated Vital Signs BP (!) 153/87   Pulse (!) 54   Temp 98.2 F (36.8 C) (Oral)   Resp 11   Ht 1.524 m (5')   Wt 43.1 kg   LMP  (LMP Unknown)   SpO2 98%   BMI 18.55 kg/m  Physical Exam Vitals reviewed.  Constitutional:      General: She is not in acute distress.    Appearance: Normal appearance.  HENT:     Head: Normocephalic.     Right Ear: External ear normal.     Left Ear: External ear normal.     Nose: Nose normal.     Mouth/Throat:     Mouth: Mucous membranes are moist.     Pharynx: Oropharynx is clear.  Eyes:     Comments: Surgical right pupil  Cardiovascular:     Rate and Rhythm: Normal rate.     Pulses: Normal pulses.  Pulmonary:     Effort: Pulmonary effort is normal.     Breath sounds: Normal breath sounds.  Abdominal:     General: Abdomen is flat.     Palpations: Abdomen is soft.  Musculoskeletal:        General: Normal range of motion.     Cervical back: Normal range of motion.     Comments: No obvious external signs of trauma No point tenderness to cervical, thoracic, or lumbar spine No point tenderness to extremities flocked range of motion  Skin:    General: Skin is warm and dry.     Capillary Refill: Capillary refill takes less than 2 seconds.  Neurological:     General: No focal deficit present.     Mental Status: She is alert.     Cranial Nerves: No cranial nerve deficit.     Motor: No weakness.     Coordination: Coordination normal.  Psychiatric:        Mood and Affect: Mood normal.        Behavior: Behavior normal.     ED Results / Procedures / Treatments   Labs (all labs ordered are listed, but only abnormal results are displayed) Labs Reviewed  BASIC METABOLIC PANEL WITH GFR -  Abnormal; Notable for the following components:      Result Value   BUN 24 (*)    Calcium  8.5 (*)    GFR, Estimated 57 (*)    All other components within normal limits  CBC    EKG None  Radiology CT Head Wo Contrast Result  Date: 07/28/2023 CLINICAL DATA:  Seizure this morning.  Altered mental status. EXAM: CT HEAD WITHOUT CONTRAST TECHNIQUE: Contiguous axial images were obtained from the base of the skull through the vertex without intravenous contrast. RADIATION DOSE REDUCTION: This exam was performed according to the departmental dose-optimization program which includes automated exposure control, adjustment of the mA and/or kV according to patient size and/or use of iterative reconstruction technique. COMPARISON:  05/14/2023 FINDINGS: Brain: No evidence of intracranial hemorrhage, acute infarction, hydrocephalus, extra-axial collection, or mass lesion/mass effect. Mild diffuse cerebral and cerebellar atrophy again noted as well as mild chronic small vessel disease. Old parietal lobe infarct again seen. Old lacunar infarcts noted in the left superior cerebellar vermis and bilateral basal ganglia again noted. Vascular:  No hyperdense vessel or other acute findings. Skull: No evidence of fracture or other significant bone abnormality. Sinuses/Orbits:  No acute findings. Other: None. IMPRESSION: No acute intracranial abnormality. Stable cerebral and cerebellar atrophy, and chronic small vessel disease. Old left parietal lobe infarct, and bilateral basal ganglia lacunar infarcts. Electronically Signed   By: Marlyce Sine M.D.   On: 07/28/2023 09:06    Procedures .Critical Care  Performed by: Auston Blush, MD Authorized by: Auston Blush, MD   Critical care provider statement:    Critical care time (minutes):  30   Critical care end time:  07/28/2023 10:46 AM   Critical care was necessary to treat or prevent imminent or life-threatening deterioration of the following conditions:  CNS failure or  compromise   Critical care was time spent personally by me on the following activities:  Development of treatment plan with patient or surrogate, discussions with consultants, evaluation of patient's response to treatment, examination of patient, ordering and review of laboratory studies, ordering and review of radiographic studies, ordering and performing treatments and interventions, pulse oximetry, re-evaluation of patient's condition and review of old charts     Medications Ordered in ED Medications - No data to display  ED Course/ Medical Decision Making/ A&P Clinical Course as of 07/28/23 1046  Tue Jul 28, 2023  0957 CT head reviewed interpreted notes of acute abnormality noted with old left parietal lobe infarct and bilateral basal ganglia lacunar infarcts [DR]    Clinical Course User Index [DR] Auston Blush, MD                                 Medical Decision Making Amount and/or Complexity of Data Reviewed Labs: ordered. Radiology: ordered.   With history of seizures sent to ED with report of focal seizure this morning.  All history is obtained from note nursing via EMS.  I was unable to contact the facility as they did not answer the phone.  Review of records show that she is on lacosamide  it appears that she has been taking this. Patient has normal exam here.  She is awake and alert with no focal deficits Patient had CT checked as she is on Eliquis .  There is no acute abnormality noted.  Labs were checked and mild hypocalcemia with calcium  8.5 otherwise within normal limits on CBC and basic metabolic panel Discussed with patient's friend who will be coming to pick her up.  Patient appears stable for discharge.        Final Clinical Impression(s) / ED Diagnoses Final diagnoses:  Seizure-like activity (HCC)    Rx / DC Orders ED Discharge Orders     None  Auston Blush, MD 07/28/23 1046

## 2023-07-28 NOTE — Discharge Instructions (Addendum)
 Patient was evaluated here in the emergency department for presumed seizure.  She has normal labs and appears back to baseline.  He is resume her normal medications as soon as she returns to facility.  She may return to the emergency department if she is having any new or worsening symptoms

## 2023-07-28 NOTE — Progress Notes (Signed)
 Location:  [facility] Room Number: AL28-A Place of Service:  ALF [phone]) Provider:  Medina-Vargas, Geneive Sandstrom, DNP, FNP-BC  Patient Care Team: Marguerite Shiley, MD as PCP - General (Internal Medicine) Loyde Rule, MD as PCP - Cardiology (Cardiology) Oris Birmingham, MD as Consulting Physician (Ophthalmology) Edmon Gosling, Burnie Cartwright, MD as Referring Physician (Ophthalmology) Debbra Fairy, MD as Consulting Physician (Neurology) Myrle Aspen, [facility] (Inactive) as Pharmacist (Pharmacist) Marguerite Shiley, MD as Consulting Physician (Internal Medicine)  Extended Emergency Contact Information Primary Emergency Contact: Ethlyn Herd Work Phone: [phone] Mobile Phone: [phone] Relation: Other Secondary Emergency Contact: Heyge,Lorna Mobile Phone: [phone] Relation: Friend Interpreter needed? No  Code Status:  Full Code  Goals of care: Advanced Directive information    07/28/2023   12:12 PM  Advanced Directives  Does Patient Have a Medical Advance Directive? Yes  Type of Estate agent of [place];Living will  Does patient want to make changes to medical advance directive? No - Patient declined  Copy of Healthcare Power of Attorney in Chart? Yes - validated most recent copy scanned in chart (See row information)  Would patient like information on creating a medical advance directive? No - Patient declined     Chief Complaint  Patient presents with   Hospitalization Follow-up    ALF follow up ED visit    HPI:  Theresa [name] is a 89 y.o. female seen today for ED visit follow up. She was sent to ED today due to jerking right arm X 5 minutes, unresponsive. O2 sat was 82% and was put on O2 at 2 L/min which increased O2 sats to 99%. She was transferred to ED via EMS.  In the ED, she was responsive, alert and oriented. CT head was negative for acute abnormality.  History of ischemic left MCA stroke  -  currently on Eliquis  2.5 mg twice  a day and Crestor  10 mg daily  Atrial fibrillation, chronic (HCC)  -denies chest pain takes Eliquis  2.5 mg twice a day for anticoagulation and metoprolol  25 mg twice a day for rate control  Recurrent major depressive disorder, in full remission (HCC)  -mood is stable, takes Lexapro  20 mg every morning  Hypothyroidism due to acquired atrophy of thyroid   -TSH 1.862 (04/08/2023), takes levothyroxine  75 mcg daily     Past Medical History:  Diagnosis Date   Atrial fibrillation (HCC)    AFIB   Collagenous colitis    CVA (cerebral vascular accident) (HCC)    a.  L parietal CVA by MRI 12/12, likely embolic;  b.  TEE by report with neg bubble study;  c.  carotid dopplers  12/12:  + plaque, no sig ICA stenosis    History of postmenopausal HRT    Hx of adenomatous colonic polyps    Hypothyroidism    MVP (mitral valve prolapse)    a. s/p MV repair at [facility] in 2008;  b. Echocardiogram 7/12: EF 50%, status post mitral valve repair with mild MR and minimal MS, mean gradient 4, moderate LAE, LA diam 55 mm; mild RAE, PASP 28-32;    Osteoarthritis    Osteoporosis    Past Surgical History:  Procedure Laterality Date   ANTERIOR APPROACH HEMI HIP ARTHROPLASTY Right 03/05/2023   Procedure: ANTERIOR APPROACH HEMI HIP ARTHROPLASTY;  Surgeon: Adonica Hoose, MD;  Location: [facility] ORS;  Service: Orthopedics;  Laterality: Right;   CARDIOVERSION  01/09/2012   Procedure: CARDIOVERSION;  Surgeon: Mardell Shade, MD;  Location: [facility] ENDOSCOPY;  Service:  Cardiovascular;  Laterality: N/A;   CATARACT EXTRACTION     CHOLECYSTECTOMY     DILATION AND CURETTAGE OF UTERUS     LUMBAR FUSION     MITRAL VALVE REPAIR  03/17/2006   "mitral valve repair CE ring and maze procedure"   TONSILLECTOMY      Allergies  Allergen Reactions   Tape Other (See Comments)    Skin tears and bruises easily.   Vioxx [Rofecoxib] Other (See Comments)    Elevated LFT's    Outpatient Encounter Medications as of 07/28/2023  Medication  Sig   amoxicillin  (AMOXIL ) 500 MG tablet Take 2,000 mg by mouth daily at 12 noon.   apixaban  (ELIQUIS ) 2.5 MG TABS tablet Take 1 tablet (2.5 mg total) by mouth 2 (two) times daily.   Calcium  Citrate-Vitamin D  (CITRACAL PETITES/VITAMIN D ) 200-6.25 MG-MCG TABS Take 1 tablet by mouth 2 (two) times daily.   docusate sodium  (COLACE) 100 MG capsule Take 1 capsule (100 mg total) by mouth 2 (two) times daily.   escitalopram  (LEXAPRO ) 20 MG tablet Take 20 mg by mouth daily.   fluticasone  (FLONASE ) 50 MCG/ACT nasal spray Place 2 sprays into both nostrils daily.   glucosamine-chondroitin 500-400 MG tablet Take 1 tablet by mouth 2 (two) times daily.   lacosamide  (VIMPAT ) 50 MG TABS tablet Take 1 tablet (50 mg total) by mouth 2 (two) times daily.   lactose free nutrition (BOOST) LIQD Take 237 mLs by mouth 2 (two) times daily between meals.   levothyroxine  (SYNTHROID ) 75 MCG tablet TAKE 1 TABLET BY MOUTH EVERY DAY BEFORE BREAKFAST   loratadine  (CLARITIN ) 10 MG tablet Take 1 tablet (10 mg total) by mouth daily.   metoprolol  tartrate (LOPRESSOR ) 25 MG tablet Take 1 tablet (25 mg total) by mouth 2 (two) times daily.   Multiple Vitamins-Minerals (PRESERVISION AREDS 2 PO) Take 1 capsule by mouth 2 (two) times daily. Reported on 09/04/2015   polyethylene glycol (MIRALAX  / GLYCOLAX ) 17 g packet Take 17 g by mouth daily as needed for mild constipation.   rosuvastatin  (CRESTOR ) 10 MG tablet Take 10 mg by mouth once. 10 mg by mouth in the evening for HLD   Saline (SIMPLY SALINE) 0.9 % AERS Place 2 each into the nose daily as needed.   TYLENOL  8 HOUR ARTHRITIS PAIN 650 MG CR tablet Take 650 mg by mouth daily.   vitamin A 10000 UNIT capsule Take 1 capsule by mouth daily.   zinc  oxide 20 % ointment Apply 1 Application topically See admin instructions. Apply topically to peri/buttocks as needed after each incontinent episode for skin protection.   guaiFENesin (MUCINEX) 600 MG 12 hr tablet Take 600 mg by mouth 2 (two) times  daily. (Patient not taking: Reported on 07/28/2023)   No facility-administered encounter medications on file as of 07/28/2023.    Review of Systems  Constitutional:  Negative for appetite change, chills, fatigue and fever.  HENT:  Negative for congestion, hearing loss, rhinorrhea and sore throat.   Eyes: Negative.   Respiratory:  Negative for cough, shortness of breath and wheezing.   Cardiovascular:  Negative for chest pain, palpitations and leg swelling.  Gastrointestinal:  Negative for abdominal pain, constipation, diarrhea, nausea and vomiting.  Genitourinary:  Negative for dysuria.  Musculoskeletal:  Negative for arthralgias, back pain and myalgias.  Skin:  Negative for color change, rash and wound.  Neurological:  Positive for seizures. Negative for dizziness, weakness and headaches.  Psychiatric/Behavioral:  Negative for behavioral problems. The patient is not nervous/anxious.  Immunization History  Administered Date(s) Administered   Fluad Quad(high Dose 65+) 11/30/2018, 01/17/2020   Fluad Trivalent(High Dose 65+) 03/02/2023   Influenza Split 12/17/2010, 12/02/2011   Influenza Whole 03/17/2001, 12/18/2006, 12/28/2007, 01/03/2009, 12/26/2009   Influenza, High Dose Seasonal PF 12/22/2012, 03/06/2015, 11/26/2015, 12/09/2017, 11/17/2022   Influenza,inj,Quad PF,6+ Mos 11/23/2013   Influenza-Unspecified 11/20/2016   PFIZER(Purple Top)SARS-COV-2 Vaccination 06/09/2019, 06/30/2019, 02/16/2020   Pneumococcal Conjugate-13 03/02/2014   Pneumococcal Polysaccharide-23 03/17/2000, 09/16/2006   Td 03/18/1995, 09/15/2006, 02/22/2021   Tdap 12/17/2010   Unspecified SARS-COV-2 Vaccination 12/08/2020   Zoster Recombinant(Shingrix) 09/18/2016, 11/20/2016   Pertinent  Health Maintenance Due  Topic Date Due   INFLUENZA VACCINE  10/16/2023   DEXA SCAN  Completed      11/25/2021   10:46 AM 02/11/2022   11:26 AM 05/26/2022   11:09 AM 03/02/2023    3:17 PM 05/18/2023   11:29 AM  Fall  Risk  Falls in the past year? 0 1 1 1 1   Was there an injury with Fall? 0 1 1 0 1  Was there an injury with Fall? - Comments  fractured rib     Fall Risk Category Calculator 0 3 3 2 2   Fall Risk Category (Retired) Low High     (RETIRED) Patient Fall Risk Level Low fall risk Low fall risk     Patient at Risk for Falls Due to No Fall Risks No Fall Risks History of fall(s) Impaired balance/gait;Impaired mobility;History of fall(s) History of fall(s);Impaired balance/gait;Impaired mobility  Fall risk Follow up Falls evaluation completed Falls evaluation completed Falls evaluation completed Falls prevention discussed Falls evaluation completed;Education provided     Vitals:   07/28/23 1209  BP: 124/88  Pulse: 88  Resp: 16  Temp: 98.1 F (36.7 C)  SpO2: 100%  Weight: 109 lb 12.8 oz (49.8 kg)  Height: 5' (1.524 m)   Body mass index is 21.44 kg/m.  Physical Exam Constitutional:      General: She is not in acute distress.    Appearance: Normal appearance.  HENT:     Head: Normocephalic and atraumatic.     Nose: Nose normal.     Mouth/Throat:     Mouth: Mucous membranes are moist.  Eyes:     Conjunctiva/sclera: Conjunctivae normal.  Cardiovascular:     Rate and Rhythm: Normal rate. Rhythm irregular.  Pulmonary:     Effort: Pulmonary effort is normal.     Breath sounds: Normal breath sounds.  Abdominal:     General: Bowel sounds are normal.     Palpations: Abdomen is soft.  Musculoskeletal:        General: Normal range of motion.     Cervical back: Normal range of motion.  Skin:    General: Skin is warm and dry.  Neurological:     Mental Status: She is alert.  Psychiatric:        Mood and Affect: Mood normal.        Behavior: Behavior normal.      Labs reviewed: Recent Labs    03/06/23 0337 03/07/23 0352 03/08/23 0352 04/06/23 1326 04/08/23 0438 04/08/23 0439 05/14/23 0948 05/14/23 0954 07/28/23 0847  NA 130* 133* 139   < > 140  --  139 139 135  K 4.0 4.6  4.4   < > 3.6  --  4.3 4.3 4.2  CL 97* 98 103   < > 105  --  102 103 104  CO2 26 29 30    < > 23  --  26  --  25  GLUCOSE 154* 131* 95   < > 89  --  110* 103* 93  BUN 25* 34* 27*   < > 24*  --  21 22 24*  CREATININE 0.93 0.89 0.64   < > 0.89  --  0.93 1.00 0.95  CALCIUM  7.7* 8.5* 8.6*   < > 8.6*  --  9.3  --  8.5*  MG  --  2.1 2.2  --   --  2.0  --   --   --   PHOS 3.1 1.6* 3.1  --   --   --   --   --   --    < > = values in this interval not displayed.   Recent Labs    03/03/23 2200 03/05/23 0748 03/08/23 0352 04/06/23 1326 05/14/23 0948  AST 29  --   --  33 29  ALT 42  --   --  28 25  ALKPHOS 81  --   --  159* 127*  BILITOT 0.7  --   --  0.7 0.9  PROT 5.9*  --   --  6.2* 6.5  ALBUMIN 3.6   < > 2.8* 3.6 3.8   < > = values in this interval not displayed.   Recent Labs    03/03/23 2124 03/05/23 0748 04/06/23 1326 05/14/23 0948 05/14/23 0954 07/28/23 0847  WBC 10.9*   < > 10.5 9.3  --  6.9  NEUTROABS 9.0*  --  8.0* 7.2  --   --   HGB 13.4   < > 11.9* 12.7 12.9 12.1  HCT 41.0   < > 37.9 39.8 38.0 38.4  MCV 102.2*   < > 101.6* 97.8  --  94.6  PLT 150   < > 191 173  --  173   < > = values in this interval not displayed.   Lab Results  Component Value Date   TSH 1.862 04/08/2023   Lab Results  Component Value Date   HGBA1C 5.5 03/06/2023   Lab Results  Component Value Date   CHOL 132 04/07/2023   HDL 66 04/07/2023   LDLCALC 57 04/07/2023   LDLDIRECT 47.0 09/04/2015   TRIG 43 04/07/2023   CHOLHDL 2.0 04/07/2023    Significant Diagnostic Results in last 30 days:  CT Head Wo Contrast Result Date: 07/28/2023 CLINICAL DATA:  Seizure this morning.  Altered mental status. EXAM: CT HEAD WITHOUT CONTRAST TECHNIQUE: Contiguous axial images were obtained from the base of the skull through the vertex without intravenous contrast. RADIATION DOSE REDUCTION: This exam was performed according to the departmental dose-optimization program which includes automated exposure  control, adjustment of the mA and/or kV according to patient size and/or use of iterative reconstruction technique. COMPARISON:  05/14/2023 FINDINGS: Brain: No evidence of intracranial hemorrhage, acute infarction, hydrocephalus, extra-axial collection, or mass lesion/mass effect. Mild diffuse cerebral and cerebellar atrophy again noted as well as mild chronic small vessel disease. Old parietal lobe infarct again seen. Old lacunar infarcts noted in the left superior cerebellar vermis and bilateral basal ganglia again noted. Vascular:  No hyperdense vessel or other acute findings. Skull: No evidence of fracture or other significant bone abnormality. Sinuses/Orbits:  No acute findings. Other: None. IMPRESSION: No acute intracranial abnormality. Stable cerebral and cerebellar atrophy, and chronic small vessel disease. Old left parietal lobe infarct, and bilateral basal ganglia lacunar infarcts. Electronically Signed   By: Marlyce Sine M.D.   On: 07/28/2023 09:06  Assessment/Plan  1. Seizure (HCC) (Primary) -   Had seizure-like activity this morning and was sent to ED, was alert and oriented in the ED - Continue lacosamide  50 mg twice a day - Seizure precautions  2. History of ischemic left MCA stroke -Stable: Continue Eliquis  2.5 mg twice a day and Crestor  10 mg q. evening  3. Atrial fibrillation, chronic (HCC) -   Rate controlled -   Continue Eliquis  2.5 mg twice a day for anticoagulation metoprolol  25 mg twice a day for rate control  4. Recurrent major depressive disorder, in full remission (HCC) -Mood is stable -    Continue Lexapro  20 mg every morning  5. Hypothyroidism due to acquired atrophy of thyroid  Lab Results  Component Value Date   TSH 1.862 04/08/2023    -   Levothyroxine  75 mcg daily     Family/ staff Communication: Discussed plan of care with resident and charge nurse.  Labs/tests ordered: None    Naelani Lafrance Medina-Vargas, DNP, MSN, FNP-BC [facility] [phone] (Monday-Friday 8:00 a.m. - 5:00 p.m.) [phone] (after hours)

## 2023-07-28 NOTE — ED Notes (Signed)
 Pt ambulated with walker. Pt had steady gait.

## 2023-07-28 NOTE — ED Notes (Signed)
 Patient transported to CT

## 2023-07-29 DIAGNOSIS — S7291XA Unspecified fracture of right femur, initial encounter for closed fracture: Secondary | ICD-10-CM | POA: Diagnosis not present

## 2023-07-29 DIAGNOSIS — R269 Unspecified abnormalities of gait and mobility: Secondary | ICD-10-CM | POA: Diagnosis not present

## 2023-07-29 DIAGNOSIS — Z4789 Encounter for other orthopedic aftercare: Secondary | ICD-10-CM | POA: Diagnosis not present

## 2023-07-29 DIAGNOSIS — R41841 Cognitive communication deficit: Secondary | ICD-10-CM | POA: Diagnosis not present

## 2023-07-29 DIAGNOSIS — Z9181 History of falling: Secondary | ICD-10-CM | POA: Diagnosis not present

## 2023-07-29 DIAGNOSIS — M6281 Muscle weakness (generalized): Secondary | ICD-10-CM | POA: Diagnosis not present

## 2023-07-30 DIAGNOSIS — Z4789 Encounter for other orthopedic aftercare: Secondary | ICD-10-CM | POA: Diagnosis not present

## 2023-07-30 DIAGNOSIS — Z9181 History of falling: Secondary | ICD-10-CM | POA: Diagnosis not present

## 2023-07-30 DIAGNOSIS — M6281 Muscle weakness (generalized): Secondary | ICD-10-CM | POA: Diagnosis not present

## 2023-07-31 DIAGNOSIS — Z9181 History of falling: Secondary | ICD-10-CM | POA: Diagnosis not present

## 2023-07-31 DIAGNOSIS — Z4789 Encounter for other orthopedic aftercare: Secondary | ICD-10-CM | POA: Diagnosis not present

## 2023-07-31 DIAGNOSIS — R269 Unspecified abnormalities of gait and mobility: Secondary | ICD-10-CM | POA: Diagnosis not present

## 2023-07-31 DIAGNOSIS — R41841 Cognitive communication deficit: Secondary | ICD-10-CM | POA: Diagnosis not present

## 2023-07-31 DIAGNOSIS — M6281 Muscle weakness (generalized): Secondary | ICD-10-CM | POA: Diagnosis not present

## 2023-07-31 DIAGNOSIS — S7291XA Unspecified fracture of right femur, initial encounter for closed fracture: Secondary | ICD-10-CM | POA: Diagnosis not present

## 2023-08-03 DIAGNOSIS — S7291XA Unspecified fracture of right femur, initial encounter for closed fracture: Secondary | ICD-10-CM | POA: Diagnosis not present

## 2023-08-03 DIAGNOSIS — R41841 Cognitive communication deficit: Secondary | ICD-10-CM | POA: Diagnosis not present

## 2023-08-03 DIAGNOSIS — Z4789 Encounter for other orthopedic aftercare: Secondary | ICD-10-CM | POA: Diagnosis not present

## 2023-08-03 DIAGNOSIS — R269 Unspecified abnormalities of gait and mobility: Secondary | ICD-10-CM | POA: Diagnosis not present

## 2023-08-03 DIAGNOSIS — M6281 Muscle weakness (generalized): Secondary | ICD-10-CM | POA: Diagnosis not present

## 2023-08-04 DIAGNOSIS — H31003 Unspecified chorioretinal scars, bilateral: Secondary | ICD-10-CM | POA: Diagnosis not present

## 2023-08-04 DIAGNOSIS — Z9181 History of falling: Secondary | ICD-10-CM | POA: Diagnosis not present

## 2023-08-04 DIAGNOSIS — M6281 Muscle weakness (generalized): Secondary | ICD-10-CM | POA: Diagnosis not present

## 2023-08-04 DIAGNOSIS — H353132 Nonexudative age-related macular degeneration, bilateral, intermediate dry stage: Secondary | ICD-10-CM | POA: Diagnosis not present

## 2023-08-04 DIAGNOSIS — Z4789 Encounter for other orthopedic aftercare: Secondary | ICD-10-CM | POA: Diagnosis not present

## 2023-08-05 DIAGNOSIS — Z4789 Encounter for other orthopedic aftercare: Secondary | ICD-10-CM | POA: Diagnosis not present

## 2023-08-05 DIAGNOSIS — M6281 Muscle weakness (generalized): Secondary | ICD-10-CM | POA: Diagnosis not present

## 2023-08-05 DIAGNOSIS — R269 Unspecified abnormalities of gait and mobility: Secondary | ICD-10-CM | POA: Diagnosis not present

## 2023-08-05 DIAGNOSIS — S7291XA Unspecified fracture of right femur, initial encounter for closed fracture: Secondary | ICD-10-CM | POA: Diagnosis not present

## 2023-08-05 DIAGNOSIS — R41841 Cognitive communication deficit: Secondary | ICD-10-CM | POA: Diagnosis not present

## 2023-08-06 DIAGNOSIS — R41841 Cognitive communication deficit: Secondary | ICD-10-CM | POA: Diagnosis not present

## 2023-08-06 DIAGNOSIS — R269 Unspecified abnormalities of gait and mobility: Secondary | ICD-10-CM | POA: Diagnosis not present

## 2023-08-06 DIAGNOSIS — M6281 Muscle weakness (generalized): Secondary | ICD-10-CM | POA: Diagnosis not present

## 2023-08-06 DIAGNOSIS — Z4789 Encounter for other orthopedic aftercare: Secondary | ICD-10-CM | POA: Diagnosis not present

## 2023-08-06 DIAGNOSIS — S7291XA Unspecified fracture of right femur, initial encounter for closed fracture: Secondary | ICD-10-CM | POA: Diagnosis not present

## 2023-08-06 DIAGNOSIS — Z9181 History of falling: Secondary | ICD-10-CM | POA: Diagnosis not present

## 2023-08-07 DIAGNOSIS — Z4789 Encounter for other orthopedic aftercare: Secondary | ICD-10-CM | POA: Diagnosis not present

## 2023-08-07 DIAGNOSIS — M6281 Muscle weakness (generalized): Secondary | ICD-10-CM | POA: Diagnosis not present

## 2023-08-07 DIAGNOSIS — R269 Unspecified abnormalities of gait and mobility: Secondary | ICD-10-CM | POA: Diagnosis not present

## 2023-08-10 DIAGNOSIS — R41841 Cognitive communication deficit: Secondary | ICD-10-CM | POA: Diagnosis not present

## 2023-08-10 DIAGNOSIS — S7291XA Unspecified fracture of right femur, initial encounter for closed fracture: Secondary | ICD-10-CM | POA: Diagnosis not present

## 2023-08-10 DIAGNOSIS — M6281 Muscle weakness (generalized): Secondary | ICD-10-CM | POA: Diagnosis not present

## 2023-08-10 DIAGNOSIS — Z9181 History of falling: Secondary | ICD-10-CM | POA: Diagnosis not present

## 2023-08-10 DIAGNOSIS — Z4789 Encounter for other orthopedic aftercare: Secondary | ICD-10-CM | POA: Diagnosis not present

## 2023-08-11 DIAGNOSIS — Z9181 History of falling: Secondary | ICD-10-CM | POA: Diagnosis not present

## 2023-08-11 DIAGNOSIS — M6281 Muscle weakness (generalized): Secondary | ICD-10-CM | POA: Diagnosis not present

## 2023-08-11 DIAGNOSIS — Z4789 Encounter for other orthopedic aftercare: Secondary | ICD-10-CM | POA: Diagnosis not present

## 2023-08-12 DIAGNOSIS — S7291XA Unspecified fracture of right femur, initial encounter for closed fracture: Secondary | ICD-10-CM | POA: Diagnosis not present

## 2023-08-12 DIAGNOSIS — R41841 Cognitive communication deficit: Secondary | ICD-10-CM | POA: Diagnosis not present

## 2023-08-12 DIAGNOSIS — M6281 Muscle weakness (generalized): Secondary | ICD-10-CM | POA: Diagnosis not present

## 2023-08-12 DIAGNOSIS — R269 Unspecified abnormalities of gait and mobility: Secondary | ICD-10-CM | POA: Diagnosis not present

## 2023-08-12 DIAGNOSIS — Z4789 Encounter for other orthopedic aftercare: Secondary | ICD-10-CM | POA: Diagnosis not present

## 2023-08-13 DIAGNOSIS — Z4789 Encounter for other orthopedic aftercare: Secondary | ICD-10-CM | POA: Diagnosis not present

## 2023-08-13 DIAGNOSIS — Z9181 History of falling: Secondary | ICD-10-CM | POA: Diagnosis not present

## 2023-08-13 DIAGNOSIS — M6281 Muscle weakness (generalized): Secondary | ICD-10-CM | POA: Diagnosis not present

## 2023-08-14 ENCOUNTER — Other Ambulatory Visit: Payer: Self-pay | Admitting: Nurse Practitioner

## 2023-08-14 DIAGNOSIS — R269 Unspecified abnormalities of gait and mobility: Secondary | ICD-10-CM | POA: Diagnosis not present

## 2023-08-14 DIAGNOSIS — S7291XA Unspecified fracture of right femur, initial encounter for closed fracture: Secondary | ICD-10-CM | POA: Diagnosis not present

## 2023-08-14 DIAGNOSIS — M6281 Muscle weakness (generalized): Secondary | ICD-10-CM | POA: Diagnosis not present

## 2023-08-14 DIAGNOSIS — Z4789 Encounter for other orthopedic aftercare: Secondary | ICD-10-CM | POA: Diagnosis not present

## 2023-08-14 DIAGNOSIS — R41841 Cognitive communication deficit: Secondary | ICD-10-CM | POA: Diagnosis not present

## 2023-08-17 DIAGNOSIS — M6281 Muscle weakness (generalized): Secondary | ICD-10-CM | POA: Diagnosis not present

## 2023-08-17 DIAGNOSIS — R269 Unspecified abnormalities of gait and mobility: Secondary | ICD-10-CM | POA: Diagnosis not present

## 2023-08-17 DIAGNOSIS — Z9181 History of falling: Secondary | ICD-10-CM | POA: Diagnosis not present

## 2023-08-17 DIAGNOSIS — Z4789 Encounter for other orthopedic aftercare: Secondary | ICD-10-CM | POA: Diagnosis not present

## 2023-08-17 DIAGNOSIS — R41841 Cognitive communication deficit: Secondary | ICD-10-CM | POA: Diagnosis not present

## 2023-08-17 DIAGNOSIS — S7291XA Unspecified fracture of right femur, initial encounter for closed fracture: Secondary | ICD-10-CM | POA: Diagnosis not present

## 2023-08-19 ENCOUNTER — Encounter: Payer: Self-pay | Admitting: Internal Medicine

## 2023-08-19 ENCOUNTER — Non-Acute Institutional Stay: Payer: Self-pay | Admitting: Internal Medicine

## 2023-08-19 DIAGNOSIS — R269 Unspecified abnormalities of gait and mobility: Secondary | ICD-10-CM | POA: Diagnosis not present

## 2023-08-19 DIAGNOSIS — Z4789 Encounter for other orthopedic aftercare: Secondary | ICD-10-CM | POA: Diagnosis not present

## 2023-08-19 DIAGNOSIS — F3342 Major depressive disorder, recurrent, in full remission: Secondary | ICD-10-CM

## 2023-08-19 DIAGNOSIS — I639 Cerebral infarction, unspecified: Secondary | ICD-10-CM | POA: Diagnosis not present

## 2023-08-19 DIAGNOSIS — I482 Chronic atrial fibrillation, unspecified: Secondary | ICD-10-CM

## 2023-08-19 DIAGNOSIS — Z8781 Personal history of (healed) traumatic fracture: Secondary | ICD-10-CM

## 2023-08-19 DIAGNOSIS — S7291XA Unspecified fracture of right femur, initial encounter for closed fracture: Secondary | ICD-10-CM | POA: Diagnosis not present

## 2023-08-19 DIAGNOSIS — E7849 Other hyperlipidemia: Secondary | ICD-10-CM

## 2023-08-19 DIAGNOSIS — R413 Other amnesia: Secondary | ICD-10-CM

## 2023-08-19 DIAGNOSIS — R569 Unspecified convulsions: Secondary | ICD-10-CM

## 2023-08-19 DIAGNOSIS — Z9181 History of falling: Secondary | ICD-10-CM | POA: Diagnosis not present

## 2023-08-19 DIAGNOSIS — M6281 Muscle weakness (generalized): Secondary | ICD-10-CM | POA: Diagnosis not present

## 2023-08-19 DIAGNOSIS — E034 Atrophy of thyroid (acquired): Secondary | ICD-10-CM

## 2023-08-19 DIAGNOSIS — R41841 Cognitive communication deficit: Secondary | ICD-10-CM | POA: Diagnosis not present

## 2023-08-19 NOTE — Progress Notes (Addendum)
 Location:  [facility] Room Number: AL28-A Place of Service:  ALF [phone]) Provider:  Marguerite Shiley, MD  Patient Care Team: Marguerite Shiley, MD as PCP - General (Internal Medicine) Loyde Rule, MD as PCP - Cardiology (Cardiology) Oris Birmingham, MD as Consulting Physician (Ophthalmology) Edmon Gosling, Burnie Cartwright, MD as Referring Physician (Ophthalmology) Debbra Fairy, MD as Consulting Physician (Neurology) Myrle Aspen, [facility] (Inactive) as Pharmacist (Pharmacist) Marguerite Shiley, MD as Consulting Physician (Internal Medicine)  Extended Emergency Contact Information Primary Emergency Contact: Ethlyn Herd Work Phone: [phone] Mobile Phone: [phone] Relation: Other Secondary Emergency Contact: Heyge,Lorna Mobile Phone: [phone] Relation: Friend Interpreter needed? No  Code Status:  Full Code Goals of care: Advanced Directive information    07/28/2023   12:12 PM  Advanced Directives  Does Patient Have a Medical Advance Directive? Yes  Type of Estate agent of [place];Living will  Does patient want to make changes to medical advance directive? No - Patient declined  Copy of Healthcare Power of Attorney in Chart? Yes - validated most recent copy scanned in chart (See row information)  Would patient like information on creating a medical advance directive? No - Patient declined     Chief Complaint  Patient presents with   Medical Management of Chronic Issues    Routine Visit    HPI:  Pt is a 89 y.o. female seen today for medical management of chronic diseases.    Patient is in AL in [facility]   Patient has a history of recurrent embolic stroke is on Eliquis  now .  She also has a history of A-fib, hypertension, hypothyroidism, GERD, CKD, depression  History of  fracture of right femoral neck s/p Arthroplasty in 12/24  Possible seizures EEG has shown cortical dysfunction but no seizure activity Patient is on  Vimpat   Patient had an episode few weeks ago where she had a jerking in her right arm and leg which lasted for 5 minutes.  Per nurses note is stopped and then patient was very confused after Patient does not remember the episode except that she woke up in the hospital She was sent to ED Where the CT did not show any acute infarcts .  She was sent back to the facility  Since then patient has been stable back to her baseline walking with her walker.  Cognitively doing better still continues to struggle with some aphasia and word finding Has Gained some weight   Past Medical History:  Diagnosis Date   Atrial fibrillation (HCC)    AFIB   Collagenous colitis    CVA (cerebral vascular accident) (HCC)    a.  L parietal CVA by MRI 12/12, likely embolic;  b.  TEE by report with neg bubble study;  c.  carotid dopplers  12/12:  + plaque, no sig ICA stenosis    History of postmenopausal HRT    Hx of adenomatous colonic polyps    Hypothyroidism    MVP (mitral valve prolapse)    a. s/p MV repair at [facility] in 2008;  b. Echocardiogram 7/12: EF 50%, status post mitral valve repair with mild MR and minimal MS, mean gradient 4, moderate LAE, LA diam 55 mm; mild RAE, PASP 28-32;    Osteoarthritis    Osteoporosis    Past Surgical History:  Procedure Laterality Date   ANTERIOR APPROACH HEMI HIP ARTHROPLASTY Right 03/05/2023   Procedure: ANTERIOR APPROACH HEMI HIP ARTHROPLASTY;  Surgeon: Adonica Hoose, MD;  Location: [facility] ORS;  Service: Orthopedics;  Laterality: Right;   CARDIOVERSION  01/09/2012   Procedure: CARDIOVERSION;  Surgeon: Mardell Shade, MD;  Location: [facility] ENDOSCOPY;  Service: Cardiovascular;  Laterality: N/A;   CATARACT EXTRACTION     CHOLECYSTECTOMY     DILATION AND CURETTAGE OF UTERUS     LUMBAR FUSION     MITRAL VALVE REPAIR  03/17/2006   "mitral valve repair CE ring and maze procedure"   TONSILLECTOMY      Allergies  Allergen Reactions   Tape Other (See Comments)    Skin tears  and bruises easily.   Vioxx [Rofecoxib] Other (See Comments)    Elevated LFT's    Outpatient Encounter Medications as of 08/19/2023  Medication Sig   amoxicillin  (AMOXIL ) 500 MG tablet Take 2,000 mg by mouth daily at 12 noon.   apixaban  (ELIQUIS ) 2.5 MG TABS tablet Take 1 tablet (2.5 mg total) by mouth 2 (two) times daily.   Calcium  Citrate-Vitamin D  (CITRACAL PETITES/VITAMIN D ) 200-6.25 MG-MCG TABS Take 1 tablet by mouth 2 (two) times daily.   docusate sodium  (COLACE) 100 MG capsule Take 1 capsule (100 mg total) by mouth 2 (two) times daily.   escitalopram  (LEXAPRO ) 20 MG tablet Take 20 mg by mouth daily.   fluticasone  (FLONASE ) 50 MCG/ACT nasal spray Place 2 sprays into both nostrils daily.   glucosamine-chondroitin 500-400 MG tablet Take 1 tablet by mouth 2 (two) times daily.   lacosamide  (VIMPAT ) 50 MG TABS tablet Take 1 tablet (50 mg total) by mouth 2 (two) times daily.   lactose free nutrition (BOOST) LIQD Take 237 mLs by mouth 2 (two) times daily between meals.   levothyroxine  (SYNTHROID ) 75 MCG tablet TAKE 1 TABLET BY MOUTH EVERY DAY BEFORE BREAKFAST   loratadine  (CLARITIN ) 10 MG tablet Take 1 tablet (10 mg total) by mouth daily.   metoprolol  tartrate (LOPRESSOR ) 25 MG tablet Take 1 tablet (25 mg total) by mouth 2 (two) times daily.   Multiple Vitamins-Minerals (PRESERVISION AREDS 2 PO) Take 1 capsule by mouth 2 (two) times daily. Reported on 09/04/2015   polyethylene glycol (MIRALAX  / GLYCOLAX ) 17 g packet Take 17 g by mouth daily as needed for mild constipation.   rosuvastatin  (CRESTOR ) 10 MG tablet Take 10 mg by mouth once. 10 mg by mouth in the evening for HLD   Saline (SIMPLY SALINE) 0.9 % AERS Place 2 each into the nose daily as needed.   TYLENOL  8 HOUR ARTHRITIS PAIN 650 MG CR tablet Take 650 mg by mouth daily.   vitamin A 10000 UNIT capsule Take 1 capsule by mouth daily.   zinc  oxide 20 % ointment Apply 1 Application topically See admin instructions. Apply topically to  peri/buttocks as needed after each incontinent episode for skin protection.   No facility-administered encounter medications on file as of 08/19/2023.    Review of Systems  Constitutional:  Negative for activity change and appetite change.  HENT: Negative.    Respiratory:  Negative for cough and shortness of breath.   Cardiovascular:  Negative for leg swelling.  Gastrointestinal:  Negative for constipation.  Musculoskeletal:  Negative for arthralgias, gait problem and myalgias.  Skin: Negative.   Neurological:  Negative for dizziness and weakness.  Psychiatric/Behavioral:  Positive for confusion. Negative for dysphoric mood and sleep disturbance.     Immunization History  Administered Date(s) Administered   Fluad Quad(high Dose 65+) 11/30/2018, 01/17/2020   Fluad Trivalent(High Dose 65+) 03/02/2023   Influenza Split 12/17/2010, 12/02/2011   Influenza Whole 03/17/2001, 12/18/2006, 12/28/2007,  01/03/2009, 12/26/2009   Influenza, High Dose Seasonal PF 12/22/2012, 03/06/2015, 11/26/2015, 12/09/2017, 11/17/2022   Influenza,inj,Quad PF,6+ Mos 11/23/2013   Influenza-Unspecified 11/20/2016   PFIZER(Purple Top)SARS-COV-2 Vaccination 06/09/2019, 06/30/2019, 02/16/2020   Pneumococcal Conjugate-13 03/02/2014   Pneumococcal Polysaccharide-23 03/17/2000, 09/16/2006   Td 03/18/1995, 09/15/2006, 02/22/2021   Tdap 12/17/2010   Unspecified SARS-COV-2 Vaccination 12/08/2020   Zoster Recombinant(Shingrix) 09/18/2016, 11/20/2016   Pertinent  Health Maintenance Due  Topic Date Due   INFLUENZA VACCINE  10/16/2023   DEXA SCAN  Completed      11/25/2021   10:46 AM 02/11/2022   11:26 AM 05/26/2022   11:09 AM 03/02/2023    3:17 PM 05/18/2023   11:29 AM  Fall Risk  Falls in the past year? 0 1 1 1 1   Was there an injury with Fall? 0 1 1 0 1  Was there an injury with Fall? - Comments  fractured rib     Fall Risk Category Calculator 0 3 3 2 2   Fall Risk Category (Retired) Low High     (RETIRED)  Patient Fall Risk Level Low fall risk Low fall risk     Patient at Risk for Falls Due to No Fall Risks No Fall Risks History of fall(s) Impaired balance/gait;Impaired mobility;History of fall(s) History of fall(s);Impaired balance/gait;Impaired mobility  Fall risk Follow up Falls evaluation completed Falls evaluation completed Falls evaluation completed Falls prevention discussed Falls evaluation completed;Education provided   Functional Status Survey:    Vitals:   08/19/23 1201  BP: (!) 146/83  Pulse: 65  Resp: 19  Temp: 97.6 F (36.4 C)  SpO2: 95%  Weight: 109 lb (49.4 kg)  Height: 5' (1.524 m)   Body mass index is 21.29 kg/m. Physical Exam Vitals reviewed.  Constitutional:      Appearance: Normal appearance.  HENT:     Head: Normocephalic.     Nose: Nose normal.     Mouth/Throat:     Mouth: Mucous membranes are moist.     Pharynx: Oropharynx is clear.  Eyes:     Pupils: Pupils are equal, round, and reactive to light.  Cardiovascular:     Rate and Rhythm: Normal rate and regular rhythm.     Pulses: Normal pulses.     Heart sounds: Normal heart sounds. No murmur heard. Pulmonary:     Effort: Pulmonary effort is normal.     Breath sounds: Normal breath sounds.  Abdominal:     General: Abdomen is flat. Bowel sounds are normal.     Palpations: Abdomen is soft.  Musculoskeletal:        General: No swelling.     Cervical back: Neck supple.  Skin:    General: Skin is warm.  Neurological:     General: No focal deficit present.     Mental Status: She is alert and oriented to person, place, and time.     Comments: Some difficulty in word finding  Psychiatric:        Mood and Affect: Mood normal.        Thought Content: Thought content normal.     Labs reviewed: Recent Labs    03/06/23 0337 03/07/23 0352 03/08/23 0352 04/06/23 1326 04/08/23 0438 04/08/23 0439 05/14/23 0948 05/14/23 0954 07/28/23 0847  NA 130* 133* 139   < > 140  --  139 139 135  K 4.0 4.6  4.4   < > 3.6  --  4.3 4.3 4.2  CL 97* 98 103   < > 105  --  102 103 104  CO2 26 29 30    < > 23  --  26  --  25  GLUCOSE 154* 131* 95   < > 89  --  110* 103* 93  BUN 25* 34* 27*   < > 24*  --  21 22 24*  CREATININE 0.93 0.89 0.64   < > 0.89  --  0.93 1.00 0.95  CALCIUM  7.7* 8.5* 8.6*   < > 8.6*  --  9.3  --  8.5*  MG  --  2.1 2.2  --   --  2.0  --   --   --   PHOS 3.1 1.6* 3.1  --   --   --   --   --   --    < > = values in this interval not displayed.   Recent Labs    03/03/23 2200 03/05/23 0748 03/08/23 0352 04/06/23 1326 05/14/23 0948  AST 29  --   --  33 29  ALT 42  --   --  28 25  ALKPHOS 81  --   --  159* 127*  BILITOT 0.7  --   --  0.7 0.9  PROT 5.9*  --   --  6.2* 6.5  ALBUMIN 3.6   < > 2.8* 3.6 3.8   < > = values in this interval not displayed.   Recent Labs    03/03/23 2124 03/05/23 0748 04/06/23 1326 05/14/23 0948 05/14/23 0954 07/28/23 0847  WBC 10.9*   < > 10.5 9.3  --  6.9  NEUTROABS 9.0*  --  8.0* 7.2  --   --   HGB 13.4   < > 11.9* 12.7 12.9 12.1  HCT 41.0   < > 37.9 39.8 38.0 38.4  MCV 102.2*   < > 101.6* 97.8  --  94.6  PLT 150   < > 191 173  --  173   < > = values in this interval not displayed.   Lab Results  Component Value Date   TSH 1.862 04/08/2023   Lab Results  Component Value Date   HGBA1C 5.5 03/06/2023   Lab Results  Component Value Date   CHOL 132 04/07/2023   HDL 66 04/07/2023   LDLCALC 57 04/07/2023   LDLDIRECT 47.0 09/04/2015   TRIG 43 04/07/2023   CHOLHDL 2.0 04/07/2023    Significant Diagnostic Results in last 30 days:  CT Head Wo Contrast Result Date: 07/28/2023 CLINICAL DATA:  Seizure this morning.  Altered mental status. EXAM: CT HEAD WITHOUT CONTRAST TECHNIQUE: Contiguous axial images were obtained from the base of the skull through the vertex without intravenous contrast. RADIATION DOSE REDUCTION: This exam was performed according to the departmental dose-optimization program which includes automated exposure  control, adjustment of the mA and/or kV according to patient size and/or use of iterative reconstruction technique. COMPARISON:  05/14/2023 FINDINGS: Brain: No evidence of intracranial hemorrhage, acute infarction, hydrocephalus, extra-axial collection, or mass lesion/mass effect. Mild diffuse cerebral and cerebellar atrophy again noted as well as mild chronic small vessel disease. Old parietal lobe infarct again seen. Old lacunar infarcts noted in the left superior cerebellar vermis and bilateral basal ganglia again noted. Vascular:  No hyperdense vessel or other acute findings. Skull: No evidence of fracture or other significant bone abnormality. Sinuses/Orbits:  No acute findings. Other: None. IMPRESSION: No acute intracranial abnormality. Stable cerebral and cerebellar atrophy, and chronic small vessel disease. Old left parietal lobe infarct, and bilateral basal ganglia lacunar  infarcts. Electronically Signed   By: Marlyce Sine M.D.   On: 07/28/2023 09:06    Assessment/Plan 1. Seizure (HCC) (Primary) On Vimpat  Patient did have breakthrough seizure Will reach out to Neurology to see if Vimpat  dose need to be increased  2. Recurrent strokes (HCC) Has been Doing well on Eliquis   3. Atrial fibrillation, chronic (HCC) Eliquis  and Metoprolol   4. Recurrent major depressive disorder, in full remission (HCC) Lexapro   5. Hypothyroidism due to acquired atrophy of thyroid  TSH normal in 1/25  6. S/P right hip fracture No Pain walks well with her walker   7  Other hyperlipidemia On statin LDL 57 in 01/25 8 Mild Cognitive issues Doing well in AL   Family/ staff Communication:   Labs/tests ordered:

## 2023-08-20 ENCOUNTER — Telehealth: Payer: Self-pay | Admitting: Neurology

## 2023-08-20 DIAGNOSIS — Z4789 Encounter for other orthopedic aftercare: Secondary | ICD-10-CM | POA: Diagnosis not present

## 2023-08-20 DIAGNOSIS — Z9181 History of falling: Secondary | ICD-10-CM | POA: Diagnosis not present

## 2023-08-20 DIAGNOSIS — M6281 Muscle weakness (generalized): Secondary | ICD-10-CM | POA: Diagnosis not present

## 2023-08-20 NOTE — Telephone Encounter (Signed)
 [facility]  call in regards to Pt PCP wanted to check up on Pt last seizure activities . PCP  would like to know what next steps would be for PT .   [facility]   CALLBACK [phone]

## 2023-08-21 DIAGNOSIS — R269 Unspecified abnormalities of gait and mobility: Secondary | ICD-10-CM | POA: Diagnosis not present

## 2023-08-21 DIAGNOSIS — Z4789 Encounter for other orthopedic aftercare: Secondary | ICD-10-CM | POA: Diagnosis not present

## 2023-08-21 DIAGNOSIS — S7291XA Unspecified fracture of right femur, initial encounter for closed fracture: Secondary | ICD-10-CM | POA: Diagnosis not present

## 2023-08-21 DIAGNOSIS — M6281 Muscle weakness (generalized): Secondary | ICD-10-CM | POA: Diagnosis not present

## 2023-08-21 DIAGNOSIS — R41841 Cognitive communication deficit: Secondary | ICD-10-CM | POA: Diagnosis not present

## 2023-08-24 ENCOUNTER — Encounter: Payer: Self-pay | Admitting: Orthopedic Surgery

## 2023-08-24 ENCOUNTER — Non-Acute Institutional Stay: Payer: Self-pay | Admitting: Orthopedic Surgery

## 2023-08-24 DIAGNOSIS — Z4789 Encounter for other orthopedic aftercare: Secondary | ICD-10-CM | POA: Diagnosis not present

## 2023-08-24 DIAGNOSIS — R0989 Other specified symptoms and signs involving the circulatory and respiratory systems: Secondary | ICD-10-CM

## 2023-08-24 DIAGNOSIS — S7291XA Unspecified fracture of right femur, initial encounter for closed fracture: Secondary | ICD-10-CM | POA: Diagnosis not present

## 2023-08-24 DIAGNOSIS — R269 Unspecified abnormalities of gait and mobility: Secondary | ICD-10-CM | POA: Diagnosis not present

## 2023-08-24 DIAGNOSIS — R41841 Cognitive communication deficit: Secondary | ICD-10-CM | POA: Diagnosis not present

## 2023-08-24 DIAGNOSIS — R1312 Dysphagia, oropharyngeal phase: Secondary | ICD-10-CM | POA: Diagnosis not present

## 2023-08-24 DIAGNOSIS — M6281 Muscle weakness (generalized): Secondary | ICD-10-CM | POA: Diagnosis not present

## 2023-08-24 NOTE — Telephone Encounter (Signed)
 Spoke with staff at [facility] assisted living. She stated patient had seizure-like activity episode on 5/13, was taken to ER. She is back to baseline (has some forgetful moments). She has no recent illness. Dr Venice Gillis is asking if patient needs Vimpat  increased, a new med added or if we need to see her sooner.  Upon review of chart, pt's next visit with Dr Omar Bibber is on 12/07/23. Last visit was with Megan NP on 06/03/23.

## 2023-08-24 NOTE — Telephone Encounter (Signed)
 If she had a breakthrough seizure and she has been compliant on medication.  Then I would recommend increasing Vimpat  to 100 mg twice a day.  Dr. Omar Bibber- just an FYI

## 2023-08-24 NOTE — Telephone Encounter (Addendum)
 Last note from pcp says this:

## 2023-08-24 NOTE — Telephone Encounter (Signed)
 I called [facility] back and spoke with pt's nurse, Alston Jerry. She will relay to Dr Venice Gillis (and I will forward encounter here) that per Megan NP, if pt had a breakthrough seizure and she has been compliant on her medication, then she recommends increasing Vimpat  to 100 mg twice daily. Looks like pt's currently on 50 mg twice daily. Katie verbalized understanding and said pt hasn't been noncompliant/missing her seizure medication.

## 2023-08-24 NOTE — Progress Notes (Signed)
 Location:  [facility] Room Number: 28/A Place of Service:  ALF [phone]) Provider:  Arnetha Bhat, NP   Marguerite Shiley, MD  Patient Care Team: Marguerite Shiley, MD as PCP - General (Internal Medicine) Loyde Rule, MD as PCP - Cardiology (Cardiology) Oris Birmingham, MD as Consulting Physician (Ophthalmology) Edmon Gosling, Burnie Cartwright, MD as Referring Physician (Ophthalmology) Debbra Fairy, MD as Consulting Physician (Neurology) Myrle Aspen, [facility] (Inactive) as Pharmacist (Pharmacist) Marguerite Shiley, MD as Consulting Physician (Internal Medicine)  Extended Emergency Contact Information Primary Emergency Contact: Ethlyn Herd Work Phone: [phone] Mobile Phone: [phone] Relation: Other Secondary Emergency Contact: Heyge,Lorna Mobile Phone: [phone] Relation: Friend Interpreter needed? No  Code Status:  DNR Goals of care: Advanced Directive information    08/19/2023   12:08 PM  Advanced Directives  Does Patient Have a Medical Advance Directive? Yes  Type of Estate agent of [place];Living will  Does patient want to make changes to medical advance directive? No - Patient declined  Copy of Healthcare Power of Attorney in Chart? Yes - validated most recent copy scanned in chart (See row information)  Would patient like information on creating a medical advance directive? No - Patient declined     Chief Complaint  Patient presents with   Acute Visit    Choking episode    HPI:  Pt is a 89 y.o. female seen today for acute visit due to choking episode.   She currently resides on the skilled nursing unit at [facility]. PMH: left MCA stroke 2015, TIA, atrial fib, MVR, HTN, HLD, Raynaud's, hypothyroidism, s/p right hip hemiarthroplasty 03/05/2023, macular degeneration, GERD, CKD, and depression.   06/08 she was taking her calcium  pill and began to choke. Nursing staff had to perform heimlich maneuver, which was  successful. She denies swallowing difficulty. H/o CVA and seizure. She passed bedside swallowing test during exam. She is refusing ST evaluation at this time. Remains on regular diet with thin liquids. Afebrile. Vitals stable.      Past Medical History:  Diagnosis Date   Atrial fibrillation (HCC)    AFIB   Collagenous colitis    CVA (cerebral vascular accident) (HCC)    a.  L parietal CVA by MRI 12/12, likely embolic;  b.  TEE by report with neg bubble study;  c.  carotid dopplers  12/12:  + plaque, no sig ICA stenosis    History of postmenopausal HRT    Hx of adenomatous colonic polyps    Hypothyroidism    MVP (mitral valve prolapse)    a. s/p MV repair at [facility] in 2008;  b. Echocardiogram 7/12: EF 50%, status post mitral valve repair with mild MR and minimal MS, mean gradient 4, moderate LAE, LA diam 55 mm; mild RAE, PASP 28-32;    Osteoarthritis    Osteoporosis    Past Surgical History:  Procedure Laterality Date   ANTERIOR APPROACH HEMI HIP ARTHROPLASTY Right 03/05/2023   Procedure: ANTERIOR APPROACH HEMI HIP ARTHROPLASTY;  Surgeon: Adonica Hoose, MD;  Location: [facility] ORS;  Service: Orthopedics;  Laterality: Right;   CARDIOVERSION  01/09/2012   Procedure: CARDIOVERSION;  Surgeon: Mardell Shade, MD;  Location: [facility] ENDOSCOPY;  Service: Cardiovascular;  Laterality: N/A;   CATARACT EXTRACTION     CHOLECYSTECTOMY     DILATION AND CURETTAGE OF UTERUS     LUMBAR FUSION     MITRAL VALVE REPAIR  03/17/2006   "mitral valve repair CE ring and maze procedure"  TONSILLECTOMY      Allergies  Allergen Reactions   Tape Other (See Comments)    Skin tears and bruises easily.   Vioxx [Rofecoxib] Other (See Comments)    Elevated LFT's    Outpatient Encounter Medications as of 08/24/2023  Medication Sig   amoxicillin  (AMOXIL ) 500 MG tablet Take 2,000 mg by mouth daily as needed.   apixaban  (ELIQUIS ) 2.5 MG TABS tablet Take 1 tablet (2.5 mg total) by mouth 2 (two) times daily.   Calcium   Citrate-Vitamin D  (CITRACAL PETITES/VITAMIN D ) 200-6.25 MG-MCG TABS Take 1 tablet by mouth 2 (two) times daily.   docusate sodium  (COLACE) 100 MG capsule Take 1 capsule (100 mg total) by mouth 2 (two) times daily.   escitalopram  (LEXAPRO ) 20 MG tablet Take 20 mg by mouth daily.   fluticasone  (FLONASE ) 50 MCG/ACT nasal spray Place 2 sprays into both nostrils daily.   glucosamine-chondroitin 500-400 MG tablet Take 1 tablet by mouth 2 (two) times daily.   lacosamide  (VIMPAT ) 50 MG TABS tablet Take 1 tablet (50 mg total) by mouth 2 (two) times daily.   lactose free nutrition (BOOST) LIQD Take 237 mLs by mouth 2 (two) times daily between meals.   levothyroxine  (SYNTHROID ) 75 MCG tablet TAKE 1 TABLET BY MOUTH EVERY DAY BEFORE BREAKFAST   loratadine  (CLARITIN ) 10 MG tablet Take 1 tablet (10 mg total) by mouth daily.   metoprolol  tartrate (LOPRESSOR ) 25 MG tablet Take 1 tablet (25 mg total) by mouth 2 (two) times daily.   Multiple Vitamins-Minerals (PRESERVISION AREDS 2 PO) Take 1 capsule by mouth 2 (two) times daily. Reported on 09/04/2015   polyethylene glycol (MIRALAX  / GLYCOLAX ) 17 g packet Take 17 g by mouth daily as needed for mild constipation.   rosuvastatin  (CRESTOR ) 10 MG tablet Take 10 mg by mouth once. 10 mg by mouth in the evening for HLD   Saline (SIMPLY SALINE) 0.9 % AERS Place 2 each into the nose daily as needed.   TYLENOL  8 HOUR ARTHRITIS PAIN 650 MG CR tablet Take 650 mg by mouth daily.   vitamin A 10000 UNIT capsule Take 1 capsule by mouth daily.   zinc  oxide 20 % ointment Apply 1 Application topically See admin instructions. Apply topically to peri/buttocks as needed after each incontinent episode for skin protection.   No facility-administered encounter medications on file as of 08/24/2023.    Review of Systems  Constitutional:  Negative for fatigue and fever.  HENT:  Negative for sore throat and trouble swallowing.   Respiratory:  Negative for shortness of breath.    Cardiovascular:  Negative for chest pain.  Gastrointestinal:  Negative for nausea and vomiting.  Psychiatric/Behavioral:  Positive for confusion. Negative for dysphoric mood. The patient is not nervous/anxious.     Immunization History  Administered Date(s) Administered   Fluad Quad(high Dose 65+) 11/30/2018, 01/17/2020   Fluad Trivalent(High Dose 65+) 03/02/2023   Influenza Split 12/17/2010, 12/02/2011   Influenza Whole 03/17/2001, 12/18/2006, 12/28/2007, 01/03/2009, 12/26/2009   Influenza, High Dose Seasonal PF 12/22/2012, 03/06/2015, 11/26/2015, 12/09/2017, 11/17/2022   Influenza,inj,Quad PF,6+ Mos 11/23/2013   Influenza-Unspecified 11/20/2016   PFIZER(Purple Top)SARS-COV-2 Vaccination 06/09/2019, 06/30/2019, 02/16/2020   Pneumococcal Conjugate-13 03/02/2014   Pneumococcal Polysaccharide-23 03/17/2000, 09/16/2006   Td 03/18/1995, 09/15/2006, 02/22/2021   Tdap 12/17/2010   Unspecified SARS-COV-2 Vaccination 12/08/2020   Zoster Recombinant(Shingrix) 09/18/2016, 11/20/2016   Pertinent  Health Maintenance Due  Topic Date Due   INFLUENZA VACCINE  10/16/2023   DEXA SCAN  Completed  11/25/2021   10:46 AM 02/11/2022   11:26 AM 05/26/2022   11:09 AM 03/02/2023    3:17 PM 05/18/2023   11:29 AM  Fall Risk  Falls in the past year? 0 1 1 1 1   Was there an injury with Fall? 0 1 1 0 1  Was there an injury with Fall? - Comments  fractured rib     Fall Risk Category Calculator 0 3 3 2 2   Fall Risk Category (Retired) Low High     (RETIRED) Patient Fall Risk Level Low fall risk Low fall risk     Patient at Risk for Falls Due to No Fall Risks No Fall Risks History of fall(s) Impaired balance/gait;Impaired mobility;History of fall(s) History of fall(s);Impaired balance/gait;Impaired mobility  Fall risk Follow up Falls evaluation completed Falls evaluation completed Falls evaluation completed Falls prevention discussed Falls evaluation completed;Education provided   Functional Status  Survey:    Vitals:   08/24/23 0946  BP: 123/69  Resp: 16  Temp: (!) 97.3 F (36.3 C)  SpO2: 97%  Weight: 109 lb (49.4 kg)  Height: 5' (1.524 m)   Body mass index is 21.29 kg/m. Physical Exam Vitals reviewed.  Constitutional:      General: She is not in acute distress. HENT:     Head: Normocephalic.     Nose: Nose normal.     Mouth/Throat:     Mouth: Mucous membranes are moist.     Pharynx: No posterior oropharyngeal erythema.  Eyes:     General:        Right eye: No discharge.        Left eye: No discharge.  Cardiovascular:     Rate and Rhythm: Normal rate. Rhythm irregular.     Pulses: Normal pulses.     Heart sounds: Normal heart sounds.  Pulmonary:     Effort: Pulmonary effort is normal. No respiratory distress.     Breath sounds: Normal breath sounds. No wheezing or rales.  Abdominal:     Palpations: Abdomen is soft.  Musculoskeletal:     Cervical back: Neck supple.     Right lower leg: No edema.     Left lower leg: No edema.  Skin:    General: Skin is warm.     Capillary Refill: Capillary refill takes less than 2 seconds.  Neurological:     General: No focal deficit present.     Mental Status: She is alert. Mental status is at baseline.     Gait: Gait abnormal.  Psychiatric:        Mood and Affect: Mood normal.     Labs reviewed: Recent Labs    03/06/23 0337 03/07/23 0352 03/08/23 0352 04/06/23 1326 04/08/23 0438 04/08/23 0439 05/14/23 0948 05/14/23 0954 07/28/23 0847  NA 130* 133* 139   < > 140  --  139 139 135  K 4.0 4.6 4.4   < > 3.6  --  4.3 4.3 4.2  CL 97* 98 103   < > 105  --  102 103 104  CO2 26 29 30    < > 23  --  26  --  25  GLUCOSE 154* 131* 95   < > 89  --  110* 103* 93  BUN 25* 34* 27*   < > 24*  --  21 22 24*  CREATININE 0.93 0.89 0.64   < > 0.89  --  0.93 1.00 0.95  CALCIUM  7.7* 8.5* 8.6*   < > 8.6*  --  9.3  --  8.5*  MG  --  2.1 2.2  --   --  2.0  --   --   --   PHOS 3.1 1.6* 3.1  --   --   --   --   --   --    < > =  values in this interval not displayed.   Recent Labs    03/03/23 2200 03/05/23 0748 03/08/23 0352 04/06/23 1326 05/14/23 0948  AST 29  --   --  33 29  ALT 42  --   --  28 25  ALKPHOS 81  --   --  159* 127*  BILITOT 0.7  --   --  0.7 0.9  PROT 5.9*  --   --  6.2* 6.5  ALBUMIN 3.6   < > 2.8* 3.6 3.8   < > = values in this interval not displayed.   Recent Labs    03/03/23 2124 03/05/23 0748 04/06/23 1326 05/14/23 0948 05/14/23 0954 07/28/23 0847  WBC 10.9*   < > 10.5 9.3  --  6.9  NEUTROABS 9.0*  --  8.0* 7.2  --   --   HGB 13.4   < > 11.9* 12.7 12.9 12.1  HCT 41.0   < > 37.9 39.8 38.0 38.4  MCV 102.2*   < > 101.6* 97.8  --  94.6  PLT 150   < > 191 173  --  173   < > = values in this interval not displayed.   Lab Results  Component Value Date   TSH 1.862 04/08/2023   Lab Results  Component Value Date   HGBA1C 5.5 03/06/2023   Lab Results  Component Value Date   CHOL 132 04/07/2023   HDL 66 04/07/2023   LDLCALC 57 04/07/2023   LDLDIRECT 47.0 09/04/2015   TRIG 43 04/07/2023   CHOLHDL 2.0 04/07/2023    Significant Diagnostic Results in last 30 days:  CT Head Wo Contrast Result Date: 07/28/2023 CLINICAL DATA:  Seizure this morning.  Altered mental status. EXAM: CT HEAD WITHOUT CONTRAST TECHNIQUE: Contiguous axial images were obtained from the base of the skull through the vertex without intravenous contrast. RADIATION DOSE REDUCTION: This exam was performed according to the departmental dose-optimization program which includes automated exposure control, adjustment of the mA and/or kV according to patient size and/or use of iterative reconstruction technique. COMPARISON:  05/14/2023 FINDINGS: Brain: No evidence of intracranial hemorrhage, acute infarction, hydrocephalus, extra-axial collection, or mass lesion/mass effect. Mild diffuse cerebral and cerebellar atrophy again noted as well as mild chronic small vessel disease. Old parietal lobe infarct again seen. Old  lacunar infarcts noted in the left superior cerebellar vermis and bilateral basal ganglia again noted. Vascular:  No hyperdense vessel or other acute findings. Skull: No evidence of fracture or other significant bone abnormality. Sinuses/Orbits:  No acute findings. Other: None. IMPRESSION: No acute intracranial abnormality. Stable cerebral and cerebellar atrophy, and chronic small vessel disease. Old left parietal lobe infarct, and bilateral basal ganglia lacunar infarcts. Electronically Signed   By: Marlyce Sine M.D.   On: 07/28/2023 09:06    Assessment/Plan 1. Choking episode (Primary) - 1 episode 06/08 when swallowing calcium  pill> heimlich performed - bedside swallowing test normal  - increased risk for dysphagia due to h/o CVA x 2 - refusing ST evaluation at this time - take larger pills with apple sauce  - cont regular diet with thin liquids    Family/ staff Communication: plan discussed with  patient and nurse  Labs/tests ordered:  none

## 2023-08-25 ENCOUNTER — Other Ambulatory Visit: Payer: Self-pay | Admitting: Adult Health

## 2023-08-25 ENCOUNTER — Encounter: Payer: Self-pay | Admitting: Adult Health

## 2023-08-25 ENCOUNTER — Non-Acute Institutional Stay: Payer: Self-pay | Admitting: Adult Health

## 2023-08-25 DIAGNOSIS — I480 Paroxysmal atrial fibrillation: Secondary | ICD-10-CM | POA: Diagnosis not present

## 2023-08-25 DIAGNOSIS — R569 Unspecified convulsions: Secondary | ICD-10-CM

## 2023-08-25 DIAGNOSIS — Z9181 History of falling: Secondary | ICD-10-CM | POA: Diagnosis not present

## 2023-08-25 DIAGNOSIS — Z4789 Encounter for other orthopedic aftercare: Secondary | ICD-10-CM | POA: Diagnosis not present

## 2023-08-25 DIAGNOSIS — S7291XA Unspecified fracture of right femur, initial encounter for closed fracture: Secondary | ICD-10-CM | POA: Diagnosis not present

## 2023-08-25 DIAGNOSIS — R41841 Cognitive communication deficit: Secondary | ICD-10-CM | POA: Diagnosis not present

## 2023-08-25 DIAGNOSIS — E034 Atrophy of thyroid (acquired): Secondary | ICD-10-CM

## 2023-08-25 DIAGNOSIS — M6281 Muscle weakness (generalized): Secondary | ICD-10-CM | POA: Diagnosis not present

## 2023-08-25 DIAGNOSIS — R1312 Dysphagia, oropharyngeal phase: Secondary | ICD-10-CM | POA: Diagnosis not present

## 2023-08-25 MED ORDER — LACOSAMIDE 100 MG PO TABS
1.0000 | ORAL_TABLET | Freq: Two times a day (BID) | ORAL | 0 refills | Status: DC
Start: 2023-08-25 — End: 2023-10-02

## 2023-08-25 NOTE — Telephone Encounter (Signed)
 Dose changed

## 2023-08-25 NOTE — Progress Notes (Signed)
 Location:  [facility] Room Number: 28 A Place of Service:  ALF [phone]) Provider:  Medina-Vargas, Nikitha Mode, DNP, FNP-BC  Patient Care Team: Marguerite Shiley, MD as PCP - General (Internal Medicine) Loyde Rule, MD as PCP - Cardiology (Cardiology) Oris Birmingham, MD as Consulting Physician (Ophthalmology) Edmon Gosling, Burnie Cartwright, MD as Referring Physician (Ophthalmology) Debbra Fairy, MD as Consulting Physician (Neurology) Myrle Aspen, [facility] (Inactive) as Pharmacist (Pharmacist) Marguerite Shiley, MD as Consulting Physician (Internal Medicine)  Extended Emergency Contact Information Primary Emergency Contact: Ethlyn Herd Work Phone: [phone] Mobile Phone: [phone] Relation: Other Secondary Emergency Contact: Heyge,Lorna Mobile Phone: [phone] Relation: Friend Interpreter needed? No  Code Status:   DNR  Goals of care: Advanced Directive information    08/19/2023   12:08 PM  Advanced Directives  Does Patient Have a Medical Advance Directive? Yes  Type of Estate agent of [place];Living will  Does patient want to make changes to medical advance directive? No - Patient declined  Copy of Healthcare Power of Attorney in Chart? Yes - validated most recent copy scanned in chart (See row information)  Would patient like information on creating a medical advance directive? No - Patient declined     Chief Complaint  Patient presents with   Acute Visit    S/P fall     HPI:  Pt is a 89 y.o. female seen today for a fall incident. She is a resident of [facility] [address] [facility]. She was reported to have been seen sitting down on the floor on her buttocks in front of her closet last night, 08/24/23.  She stated that she was getting up from her chair to go to the bathroom, her husband wheeled her walker over to her, she stood up and became dizzy and proceeded to walk towards the bathroom and fell backwards.  She stated that she  bumped her head on a small stool in her room.  She sustained a hematoma on the back of her head.  She denies pain.  She is able to move all 4 extremities and walk without an assistive device.  She denies dizziness  She was seen in her room and today with husband beside her.  She denies pain nor headache.  Recommended for her to go to the emergency room for imaging of her head due to her taking Eliquis  and hitting her head on a stool.  She takes Eliquis  for anticoagulation due to her atrial fibrillation.  Patient declined to go to the hospital.  She stated that she is still paying for her bills from her last hospitalization 04/08/2023.Aaron Aas  She takes lacosamide  50 mg twice a day for seizure.  She denies recent seizures.  She takes levothyroxine  75 mcg daily for hypothyroidism.    Past Medical History:  Diagnosis Date   Atrial fibrillation (HCC)    AFIB   Collagenous colitis    CVA (cerebral vascular accident) (HCC)    a.  L parietal CVA by MRI 12/12, likely embolic;  b.  TEE by report with neg bubble study;  c.  carotid dopplers  12/12:  + plaque, no sig ICA stenosis    History of postmenopausal HRT    Hx of adenomatous colonic polyps    Hypothyroidism    MVP (mitral valve prolapse)    a. s/p MV repair at [facility] in 2008;  b. Echocardiogram 7/12: EF 50%, status post mitral valve repair with mild MR and minimal MS, mean gradient 4, moderate  LAE, LA diam 55 mm; mild RAE, PASP 28-32;    Osteoarthritis    Osteoporosis    Past Surgical History:  Procedure Laterality Date   ANTERIOR APPROACH HEMI HIP ARTHROPLASTY Right 03/05/2023   Procedure: ANTERIOR APPROACH HEMI HIP ARTHROPLASTY;  Surgeon: Adonica Hoose, MD;  Location: [facility] ORS;  Service: Orthopedics;  Laterality: Right;   CARDIOVERSION  01/09/2012   Procedure: CARDIOVERSION;  Surgeon: Mardell Shade, MD;  Location: [facility] ENDOSCOPY;  Service: Cardiovascular;  Laterality: N/A;   CATARACT EXTRACTION     CHOLECYSTECTOMY     DILATION AND CURETTAGE  OF UTERUS     LUMBAR FUSION     MITRAL VALVE REPAIR  03/17/2006   "mitral valve repair CE ring and maze procedure"   TONSILLECTOMY      Allergies  Allergen Reactions   Tape Other (See Comments)    Skin tears and bruises easily.   Vioxx [Rofecoxib] Other (See Comments)    Elevated LFT's    Outpatient Encounter Medications as of 08/25/2023  Medication Sig   amoxicillin  (AMOXIL ) 500 MG tablet Take 2,000 mg by mouth daily as needed.   apixaban  (ELIQUIS ) 2.5 MG TABS tablet Take 1 tablet (2.5 mg total) by mouth 2 (two) times daily.   Calcium  Citrate-Vitamin D  (CITRACAL PETITES/VITAMIN D ) 200-6.25 MG-MCG TABS Take 1 tablet by mouth 2 (two) times daily.   docusate sodium  (COLACE) 100 MG capsule Take 1 capsule (100 mg total) by mouth 2 (two) times daily.   escitalopram  (LEXAPRO ) 20 MG tablet Take 20 mg by mouth daily.   fluticasone  (FLONASE ) 50 MCG/ACT nasal spray Place 2 sprays into both nostrils daily.   glucosamine-chondroitin 500-400 MG tablet Take 1 tablet by mouth 2 (two) times daily.   lacosamide  (VIMPAT ) 50 MG TABS tablet Take 1 tablet (50 mg total) by mouth 2 (two) times daily.   lactose free nutrition (BOOST) LIQD Take 237 mLs by mouth 2 (two) times daily between meals.   levothyroxine  (SYNTHROID ) 75 MCG tablet TAKE 1 TABLET BY MOUTH EVERY DAY BEFORE BREAKFAST   loratadine  (CLARITIN ) 10 MG tablet Take 1 tablet (10 mg total) by mouth daily.   metoprolol  tartrate (LOPRESSOR ) 25 MG tablet Take 1 tablet (25 mg total) by mouth 2 (two) times daily.   Multiple Vitamins-Minerals (PRESERVISION AREDS 2 PO) Take 1 capsule by mouth 2 (two) times daily. Reported on 09/04/2015   polyethylene glycol (MIRALAX  / GLYCOLAX ) 17 g packet Take 17 g by mouth daily as needed for mild constipation.   rosuvastatin  (CRESTOR ) 10 MG tablet Take 10 mg by mouth once. 10 mg by mouth in the evening for HLD   Saline (SIMPLY SALINE) 0.9 % AERS Place 2 each into the nose daily as needed.   TYLENOL  8 HOUR ARTHRITIS PAIN  650 MG CR tablet Take 650 mg by mouth daily.   vitamin A 10000 UNIT capsule Take 1 capsule by mouth daily.   zinc  oxide 20 % ointment Apply 1 Application topically See admin instructions. Apply topically to peri/buttocks as needed after each incontinent episode for skin protection.   No facility-administered encounter medications on file as of 08/25/2023.    Review of Systems  Constitutional:  Negative for appetite change, chills, fatigue and fever.  HENT:  Negative for congestion, hearing loss, rhinorrhea and sore throat.   Eyes: Negative.   Respiratory:  Negative for cough, shortness of breath and wheezing.   Cardiovascular:  Negative for chest pain, palpitations and leg swelling.  Gastrointestinal:  Negative for abdominal pain, constipation,  diarrhea, nausea and vomiting.  Genitourinary:  Negative for dysuria.  Musculoskeletal:  Negative for arthralgias, back pain and myalgias.  Skin:  Negative for color change, rash and wound.  Neurological:  Negative for dizziness, weakness and headaches.  Psychiatric/Behavioral:  Negative for behavioral problems. The patient is not nervous/anxious.       Immunization History  Administered Date(s) Administered   Fluad Quad(high Dose 65+) 11/30/2018, 01/17/2020   Fluad Trivalent(High Dose 65+) 03/02/2023   Influenza Split 12/17/2010, 12/02/2011   Influenza Whole 03/17/2001, 12/18/2006, 12/28/2007, 01/03/2009, 12/26/2009   Influenza, High Dose Seasonal PF 12/22/2012, 03/06/2015, 11/26/2015, 12/09/2017, 11/17/2022   Influenza,inj,Quad PF,6+ Mos 11/23/2013   Influenza-Unspecified 11/20/2016   PFIZER(Purple Top)SARS-COV-2 Vaccination 06/09/2019, 06/30/2019, 02/16/2020   Pneumococcal Conjugate-13 03/02/2014   Pneumococcal Polysaccharide-23 03/17/2000, 09/16/2006   Td 03/18/1995, 09/15/2006, 02/22/2021   Tdap 12/17/2010   Unspecified SARS-COV-2 Vaccination 12/08/2020   Zoster Recombinant(Shingrix) 09/18/2016, 11/20/2016   Pertinent  Health  Maintenance Due  Topic Date Due   INFLUENZA VACCINE  10/16/2023   DEXA SCAN  Completed      11/25/2021   10:46 AM 02/11/2022   11:26 AM 05/26/2022   11:09 AM 03/02/2023    3:17 PM 05/18/2023   11:29 AM  Fall Risk  Falls in the past year? 0 1 1 1 1   Was there an injury with Fall? 0 1 1 0 1  Was there an injury with Fall? - Comments  fractured rib     Fall Risk Category Calculator 0 3 3 2 2   Fall Risk Category (Retired) Low High     (RETIRED) Patient Fall Risk Level Low fall risk Low fall risk     Patient at Risk for Falls Due to No Fall Risks No Fall Risks History of fall(s) Impaired balance/gait;Impaired mobility;History of fall(s) History of fall(s);Impaired balance/gait;Impaired mobility  Fall risk Follow up Falls evaluation completed Falls evaluation completed Falls evaluation completed Falls prevention discussed Falls evaluation completed;Education provided     Vitals:   08/25/23 0935  BP: 123/69  Pulse: 78  Resp: 16  Temp: (!) 97.3 F (36.3 C)  SpO2: 97%  Weight: 109 lb (49.4 kg)  Height: 5' (1.524 m)   Body mass index is 21.29 kg/m.  Physical Exam Constitutional:      Appearance: Normal appearance.  HENT:     Head: Normocephalic and atraumatic.     Nose: Nose normal.     Mouth/Throat:     Mouth: Mucous membranes are moist.  Eyes:     Conjunctiva/sclera: Conjunctivae normal.  Cardiovascular:     Rate and Rhythm: Normal rate. Rhythm irregular.  Pulmonary:     Effort: Pulmonary effort is normal.     Breath sounds: Normal breath sounds.  Abdominal:     General: Bowel sounds are normal.     Palpations: Abdomen is soft.  Musculoskeletal:        General: Normal range of motion.     Cervical back: Normal range of motion.  Skin:    General: Skin is warm and dry.  Neurological:     General: No focal deficit present.     Mental Status: She is alert and oriented to person, place, and time.  Psychiatric:        Mood and Affect: Mood normal.        Behavior:  Behavior normal.        Thought Content: Thought content normal.        Judgment: Judgment normal.  Labs reviewed: Recent Labs    03/06/23 0337 03/07/23 0352 03/08/23 0352 04/06/23 1326 04/08/23 0438 04/08/23 0439 05/14/23 0948 05/14/23 0954 07/28/23 0847  NA 130* 133* 139   < > 140  --  139 139 135  K 4.0 4.6 4.4   < > 3.6  --  4.3 4.3 4.2  CL 97* 98 103   < > 105  --  102 103 104  CO2 26 29 30    < > 23  --  26  --  25  GLUCOSE 154* 131* 95   < > 89  --  110* 103* 93  BUN 25* 34* 27*   < > 24*  --  21 22 24*  CREATININE 0.93 0.89 0.64   < > 0.89  --  0.93 1.00 0.95  CALCIUM  7.7* 8.5* 8.6*   < > 8.6*  --  9.3  --  8.5*  MG  --  2.1 2.2  --   --  2.0  --   --   --   PHOS 3.1 1.6* 3.1  --   --   --   --   --   --    < > = values in this interval not displayed.   Recent Labs    03/03/23 2200 03/05/23 0748 03/08/23 0352 04/06/23 1326 05/14/23 0948  AST 29  --   --  33 29  ALT 42  --   --  28 25  ALKPHOS 81  --   --  159* 127*  BILITOT 0.7  --   --  0.7 0.9  PROT 5.9*  --   --  6.2* 6.5  ALBUMIN 3.6   < > 2.8* 3.6 3.8   < > = values in this interval not displayed.   Recent Labs    03/03/23 2124 03/05/23 0748 04/06/23 1326 05/14/23 0948 05/14/23 0954 07/28/23 0847  WBC 10.9*   < > 10.5 9.3  --  6.9  NEUTROABS 9.0*  --  8.0* 7.2  --   --   HGB 13.4   < > 11.9* 12.7 12.9 12.1  HCT 41.0   < > 37.9 39.8 38.0 38.4  MCV 102.2*   < > 101.6* 97.8  --  94.6  PLT 150   < > 191 173  --  173   < > = values in this interval not displayed.   Lab Results  Component Value Date   TSH 1.862 04/08/2023   Lab Results  Component Value Date   HGBA1C 5.5 03/06/2023   Lab Results  Component Value Date   CHOL 132 04/07/2023   HDL 66 04/07/2023   LDLCALC 57 04/07/2023   LDLDIRECT 47.0 09/04/2015   TRIG 43 04/07/2023   CHOLHDL 2.0 04/07/2023    Significant Diagnostic Results in last 30 days:  CT Head Wo Contrast Result Date: 07/28/2023 CLINICAL DATA:  Seizure  this morning.  Altered mental status. EXAM: CT HEAD WITHOUT CONTRAST TECHNIQUE: Contiguous axial images were obtained from the base of the skull through the vertex without intravenous contrast. RADIATION DOSE REDUCTION: This exam was performed according to the departmental dose-optimization program which includes automated exposure control, adjustment of the mA and/or kV according to patient size and/or use of iterative reconstruction technique. COMPARISON:  05/14/2023 FINDINGS: Brain: No evidence of intracranial hemorrhage, acute infarction, hydrocephalus, extra-axial collection, or mass lesion/mass effect. Mild diffuse cerebral and cerebellar atrophy again noted as well as mild chronic small vessel disease. Old parietal lobe  infarct again seen. Old lacunar infarcts noted in the left superior cerebellar vermis and bilateral basal ganglia again noted. Vascular:  No hyperdense vessel or other acute findings. Skull: No evidence of fracture or other significant bone abnormality. Sinuses/Orbits:  No acute findings. Other: None. IMPRESSION: No acute intracranial abnormality. Stable cerebral and cerebellar atrophy, and chronic small vessel disease. Old left parietal lobe infarct, and bilateral basal ganglia lacunar infarcts. Electronically Signed   By: Marlyce Sine M.D.   On: 07/28/2023 09:06    Assessment/Plan  1. Paroxysmal atrial fibrillation (HCC) (Primary) -  rate-controlled -  continue Eliquis  2.5 mg BID for anticoagulation and Metoprolol  tartrate 25 mg BID for rate-control  2. Status post fall -   Fell hitting back of her head on 08/24/23 -  denies dizziness, headache, able to move all 4 extremities -  declined to go to the hospital for evaluation -  continue neuro checks  3. Seizure (HCC) -  no recent seizures -   continue Lacosamide  50 mg BID -  fall precautions  4. Hypothyroidism due to acquired atrophy of thyroid  Lab Results  Component Value Date   TSH 1.862 04/08/2023    -  continue  Levothyroxine  75 mcg daily     Family/ staff Communication: Discussed plan of care with resident and charge nurse.  Labs/tests ordered:  None    Faigy Stretch Medina-Vargas, DNP, MSN, FNP-BC [facility] [phone] (Monday-Friday 8:00 a.m. - 5:00 p.m.) [phone] (after hours)

## 2023-08-26 DIAGNOSIS — R1312 Dysphagia, oropharyngeal phase: Secondary | ICD-10-CM | POA: Diagnosis not present

## 2023-08-26 DIAGNOSIS — M6281 Muscle weakness (generalized): Secondary | ICD-10-CM | POA: Diagnosis not present

## 2023-08-26 DIAGNOSIS — R269 Unspecified abnormalities of gait and mobility: Secondary | ICD-10-CM | POA: Diagnosis not present

## 2023-08-26 DIAGNOSIS — R41841 Cognitive communication deficit: Secondary | ICD-10-CM | POA: Diagnosis not present

## 2023-08-26 DIAGNOSIS — Z4789 Encounter for other orthopedic aftercare: Secondary | ICD-10-CM | POA: Diagnosis not present

## 2023-08-26 DIAGNOSIS — Z9181 History of falling: Secondary | ICD-10-CM | POA: Diagnosis not present

## 2023-08-26 DIAGNOSIS — S7291XA Unspecified fracture of right femur, initial encounter for closed fracture: Secondary | ICD-10-CM | POA: Diagnosis not present

## 2023-08-27 DIAGNOSIS — Z4789 Encounter for other orthopedic aftercare: Secondary | ICD-10-CM | POA: Diagnosis not present

## 2023-08-27 DIAGNOSIS — M6281 Muscle weakness (generalized): Secondary | ICD-10-CM | POA: Diagnosis not present

## 2023-08-27 DIAGNOSIS — Z9181 History of falling: Secondary | ICD-10-CM | POA: Diagnosis not present

## 2023-08-27 NOTE — Telephone Encounter (Signed)
 Maybe consider EKG at her next visit with you all. Vimpat  can sometimes increase the PR interval.

## 2023-08-27 NOTE — Telephone Encounter (Signed)
Ok will do. Thanks.

## 2023-08-28 DIAGNOSIS — R41841 Cognitive communication deficit: Secondary | ICD-10-CM | POA: Diagnosis not present

## 2023-08-28 DIAGNOSIS — M6281 Muscle weakness (generalized): Secondary | ICD-10-CM | POA: Diagnosis not present

## 2023-08-28 DIAGNOSIS — R1312 Dysphagia, oropharyngeal phase: Secondary | ICD-10-CM | POA: Diagnosis not present

## 2023-08-28 DIAGNOSIS — Z4789 Encounter for other orthopedic aftercare: Secondary | ICD-10-CM | POA: Diagnosis not present

## 2023-08-28 DIAGNOSIS — S7291XA Unspecified fracture of right femur, initial encounter for closed fracture: Secondary | ICD-10-CM | POA: Diagnosis not present

## 2023-08-28 DIAGNOSIS — R269 Unspecified abnormalities of gait and mobility: Secondary | ICD-10-CM | POA: Diagnosis not present

## 2023-08-31 ENCOUNTER — Non-Acute Institutional Stay: Payer: Self-pay | Admitting: Orthopedic Surgery

## 2023-08-31 ENCOUNTER — Encounter: Payer: Self-pay | Admitting: Orthopedic Surgery

## 2023-08-31 DIAGNOSIS — R296 Repeated falls: Secondary | ICD-10-CM | POA: Diagnosis not present

## 2023-08-31 DIAGNOSIS — R569 Unspecified convulsions: Secondary | ICD-10-CM | POA: Diagnosis not present

## 2023-08-31 DIAGNOSIS — S0083XA Contusion of other part of head, initial encounter: Secondary | ICD-10-CM | POA: Diagnosis not present

## 2023-08-31 DIAGNOSIS — Z9181 History of falling: Secondary | ICD-10-CM | POA: Diagnosis not present

## 2023-08-31 DIAGNOSIS — M6281 Muscle weakness (generalized): Secondary | ICD-10-CM | POA: Diagnosis not present

## 2023-08-31 DIAGNOSIS — R269 Unspecified abnormalities of gait and mobility: Secondary | ICD-10-CM | POA: Diagnosis not present

## 2023-08-31 DIAGNOSIS — I480 Paroxysmal atrial fibrillation: Secondary | ICD-10-CM | POA: Diagnosis not present

## 2023-08-31 DIAGNOSIS — Z4789 Encounter for other orthopedic aftercare: Secondary | ICD-10-CM | POA: Diagnosis not present

## 2023-08-31 NOTE — Progress Notes (Signed)
 Location:  [facility] Room Number: 28/A Place of Service:  ALF [phone]) Provider:  Arnetha Bhat, NP   Marguerite Shiley, MD  Patient Care Team: Marguerite Shiley, MD as PCP - General (Internal Medicine) Loyde Rule, MD as PCP - Cardiology (Cardiology) Oris Birmingham, MD as Consulting Physician (Ophthalmology) Edmon Gosling, Burnie Cartwright, MD as Referring Physician (Ophthalmology) Debbra Fairy, MD as Consulting Physician (Neurology) Myrle Aspen, [facility] (Inactive) as Pharmacist (Pharmacist) Marguerite Shiley, MD as Consulting Physician (Internal Medicine)  Extended Emergency Contact Information Primary Emergency Contact: Ethlyn Herd Work Phone: [phone] Mobile Phone: [phone] Relation: Other Secondary Emergency Contact: Heyge,Lorna Mobile Phone: [phone] Relation: Friend Interpreter needed? No  Code Status:  DNR Goals of care: Advanced Directive information    08/19/2023   12:08 PM  Advanced Directives  Does Patient Have a Medical Advance Directive? Yes  Type of Estate agent of [place];Living will  Does patient want to make changes to medical advance directive? No - Patient declined  Copy of Healthcare Power of Attorney in Chart? Yes - validated most recent copy scanned in chart (See row information)  Would patient like information on creating a medical advance directive? No - Patient declined     Chief Complaint  Patient presents with   Acute Visit    Head injury    HPI:  Pt is a 89 y.o. [name] seen today for acute visit due to head injury.   She currently resides on the skilled nursing unit at [facility]. PMH: left MCA stroke 2015, TIA, atrial fib, MVR, HTN, HLD, Raynaud's, hypothyroidism, s/p right hip hemiarthroplasty 03/05/2023, macular degeneration, GERD, CKD, and depression.   Reported unwitnessed fall 06/15 and 06/09. Developed hematoma after first fall. Yesterday, she was trying to go around bed  without walker and slipped. She was not using her walker. She hit the back of her head. Refused to go to the ED after event. Neuro checks have been normal. She is followed by PT. Falls safety discussed with patient. Denies chest pain, sob, dizziness, generalized pain. Afebrile. Vitals stable.   Remains on low dose Eliquis  for Afib/CVA prevention.   H/o seizures. Vimpat  recently increased. EKG recommended by neurology.    Past Medical History:  Diagnosis Date   Atrial fibrillation (HCC)    AFIB   Collagenous colitis    CVA (cerebral vascular accident) (HCC)    a.  L parietal CVA by MRI 12/12, likely embolic;  b.  TEE by report with neg bubble study;  c.  carotid dopplers  12/12:  + plaque, no sig ICA stenosis    History of postmenopausal HRT    Hx of adenomatous colonic polyps    Hypothyroidism    MVP (mitral valve prolapse)    a. s/p MV repair at [facility] in 2008;  b. Echocardiogram 7/12: EF 50%, status post mitral valve repair with mild MR and minimal MS, mean gradient 4, moderate LAE, LA diam 55 mm; mild RAE, PASP 28-32;    Osteoarthritis    Osteoporosis    Past Surgical History:  Procedure Laterality Date   ANTERIOR APPROACH HEMI HIP ARTHROPLASTY Right 03/05/2023   Procedure: ANTERIOR APPROACH HEMI HIP ARTHROPLASTY;  Surgeon: Adonica Hoose, MD;  Location: [facility] ORS;  Service: Orthopedics;  Laterality: Right;   CARDIOVERSION  01/09/2012   Procedure: CARDIOVERSION;  Surgeon: Mardell Shade, MD;  Location: [facility] ENDOSCOPY;  Service: Cardiovascular;  Laterality: N/A;   CATARACT EXTRACTION     CHOLECYSTECTOMY  DILATION AND CURETTAGE OF UTERUS     LUMBAR FUSION     MITRAL VALVE REPAIR  03/17/2006   mitral valve repair CE ring and maze procedure   TONSILLECTOMY      Allergies  Allergen Reactions   Tape Other (See Comments)    Skin tears and bruises easily.   Vioxx [Rofecoxib] Other (See Comments)    Elevated LFT's    Outpatient Encounter Medications as of 08/31/2023   Medication Sig   amoxicillin  (AMOXIL ) 500 MG tablet Take 2,000 mg by mouth daily as needed.   apixaban  (ELIQUIS ) 2.5 MG TABS tablet Take 1 tablet (2.5 mg total) by mouth 2 (two) times daily.   Calcium  Citrate-Vitamin D  (CITRACAL PETITES/VITAMIN D ) 200-6.25 MG-MCG TABS Take 1 tablet by mouth 2 (two) times daily.   docusate sodium  (COLACE) 100 MG capsule Take 1 capsule (100 mg total) by mouth 2 (two) times daily.   escitalopram  (LEXAPRO ) 20 MG tablet Take 20 mg by mouth daily.   fluticasone  (FLONASE ) 50 MCG/ACT nasal spray Place 2 sprays into both nostrils daily.   glucosamine-chondroitin 500-400 MG tablet Take 1 tablet by mouth 2 (two) times daily.   Lacosamide  100 MG TABS Take 1 tablet (100 mg total) by mouth in the morning and at bedtime.   lactose free nutrition (BOOST) LIQD Take 237 mLs by mouth 2 (two) times daily between meals.   levothyroxine  (SYNTHROID ) 75 MCG tablet TAKE 1 TABLET BY MOUTH EVERY DAY BEFORE BREAKFAST   loratadine  (CLARITIN ) 10 MG tablet Take 1 tablet (10 mg total) by mouth daily.   metoprolol  tartrate (LOPRESSOR ) 25 MG tablet Take 1 tablet (25 mg total) by mouth 2 (two) times daily.   Multiple Vitamins-Minerals (PRESERVISION AREDS 2 PO) Take 1 capsule by mouth 2 (two) times daily. Reported on 09/04/2015   polyethylene glycol (MIRALAX  / GLYCOLAX ) 17 g packet Take 17 g by mouth daily as needed for mild constipation.   rosuvastatin  (CRESTOR ) 10 MG tablet Take 10 mg by mouth once. 10 mg by mouth in the evening for HLD   Saline (SIMPLY SALINE) 0.9 % AERS Place 2 each into the nose daily as needed.   TYLENOL  8 HOUR ARTHRITIS PAIN 650 MG CR tablet Take 650 mg by mouth daily.   vitamin A 10000 UNIT capsule Take 1 capsule by mouth daily.   zinc  oxide 20 % ointment Apply 1 Application topically See admin instructions. Apply topically to peri/buttocks as needed after each incontinent episode for skin protection.   No facility-administered encounter medications on file as of  08/31/2023.    Review of Systems  Constitutional:  Negative for fatigue and fever.  HENT:  Negative for sore throat and trouble swallowing.   Eyes:  Negative for visual disturbance.  Respiratory:  Negative for cough and shortness of breath.   Cardiovascular:  Negative for chest pain and leg swelling.  Gastrointestinal:  Negative for abdominal distention and abdominal pain.  Genitourinary:  Negative for dysuria, frequency and hematuria.  Musculoskeletal:  Positive for gait problem. Negative for arthralgias.  Skin:  Positive for wound.  Neurological:  Positive for seizures and weakness. Negative for dizziness, light-headedness and headaches.  Psychiatric/Behavioral:  Positive for confusion. Negative for dysphoric mood and sleep disturbance. The patient is not nervous/anxious.     Immunization History  Administered Date(s) Administered   Fluad Quad(high Dose 65+) 11/30/2018, 01/17/2020   Fluad Trivalent(High Dose 65+) 03/02/2023   Influenza Split 12/17/2010, 12/02/2011   Influenza Whole 03/17/2001, 12/18/2006, 12/28/2007, 01/03/2009, 12/26/2009  Influenza, High Dose Seasonal PF 12/22/2012, 03/06/2015, 11/26/2015, 12/09/2017, 11/17/2022   Influenza,inj,Quad PF,6+ Mos 11/23/2013   Influenza-Unspecified 11/20/2016   PFIZER(Purple Top)SARS-COV-2 Vaccination 06/09/2019, 06/30/2019, 02/16/2020   Pneumococcal Conjugate-13 03/02/2014   Pneumococcal Polysaccharide-23 03/17/2000, 09/16/2006   Td 03/18/1995, 09/15/2006, 02/22/2021   Tdap 12/17/2010   Unspecified SARS-COV-2 Vaccination 12/08/2020   Zoster Recombinant(Shingrix) 09/18/2016, 11/20/2016   Pertinent  Health Maintenance Due  Topic Date Due   INFLUENZA VACCINE  10/16/2023   DEXA SCAN  Completed      11/25/2021   10:46 AM 02/11/2022   11:26 AM 05/26/2022   11:09 AM 03/02/2023    3:17 PM 05/18/2023   11:29 AM  Fall Risk  Falls in the past year? 0 1 1 1 1   Was there an injury with Fall? 0 1 1 0 1  Was there an injury with Fall? -  Comments  fractured rib     Fall Risk Category Calculator 0 3 3 2 2   Fall Risk Category (Retired) Low  High      (RETIRED) Patient Fall Risk Level Low fall risk  Low fall risk      Patient at Risk for Falls Due to No Fall Risks No Fall Risks History of fall(s) Impaired balance/gait;Impaired mobility;History of fall(s) History of fall(s);Impaired balance/gait;Impaired mobility  Fall risk Follow up Falls evaluation completed  Falls evaluation completed  Falls evaluation completed Falls prevention discussed Falls evaluation completed;Education provided     Data saved with a previous flowsheet row definition   Functional Status Survey:    Vitals:   08/31/23 1525  BP: 112/74  Resp: 16  Temp: (!) 97 F (36.1 C)  SpO2: 95%  Weight: 109 lb (49.4 kg)  Height: 5' (1.524 m)   Body mass index is 21.29 kg/m. Physical Exam Vitals reviewed.  Constitutional:      General: She is not in acute distress. HENT:     Head: Normocephalic.     Comments: Approx 4- 5 cm hematoma to occipital region of head, no skin breakdown    Right Ear: Tympanic membrane normal.     Left Ear: Tympanic membrane normal.     Nose: Nose normal.     Mouth/Throat:     Mouth: Mucous membranes are moist.   Eyes:     General:        Right eye: No discharge.        Left eye: No discharge.     Extraocular Movements: Extraocular movements intact.     Right eye: Normal extraocular motion and no nystagmus.     Left eye: Normal extraocular motion and no nystagmus.     Pupils: Pupils are equal, round, and reactive to light.    Cardiovascular:     Rate and Rhythm: Normal rate. Rhythm irregular.     Pulses: Normal pulses.     Heart sounds: Normal heart sounds.  Pulmonary:     Effort: Pulmonary effort is normal.     Breath sounds: Normal breath sounds.  Abdominal:     General: There is no distension.     Palpations: Abdomen is soft.     Tenderness: There is no abdominal tenderness.   Musculoskeletal:     Cervical  back: Neck supple.     Right lower leg: No edema.     Left lower leg: No edema.     Comments: Moves extremities without difficulty   Skin:    General: Skin is warm.     Capillary Refill: Capillary refill  takes less than 2 seconds.   Neurological:     General: No focal deficit present.     Mental Status: She is alert. Mental status is at baseline.     Motor: Weakness present.     Gait: Gait abnormal.   Psychiatric:        Mood and Affect: Mood normal.     Labs reviewed: Recent Labs    03/06/23 0337 03/07/23 0352 03/08/23 0352 04/06/23 1326 04/08/23 0438 04/08/23 0439 05/14/23 0948 05/14/23 0954 07/28/23 0847  NA 130* 133* 139   < > 140  --  139 139 135  K 4.0 4.6 4.4   < > 3.6  --  4.3 4.3 4.2  CL 97* 98 103   < > 105  --  102 103 104  CO2 26 29 30    < > 23  --  26  --  25  GLUCOSE 154* 131* 95   < > 89  --  110* 103* 93  BUN 25* 34* 27*   < > 24*  --  21 22 24*  CREATININE 0.93 0.89 0.64   < > 0.89  --  0.93 1.00 0.95  CALCIUM  7.7* 8.5* 8.6*   < > 8.6*  --  9.3  --  8.5*  MG  --  2.1 2.2  --   --  2.0  --   --   --   PHOS 3.1 1.6* 3.1  --   --   --   --   --   --    < > = values in this interval not displayed.   Recent Labs    03/03/23 2200 03/05/23 0748 03/08/23 0352 04/06/23 1326 05/14/23 0948  AST 29  --   --  33 29  ALT 42  --   --  28 25  ALKPHOS 81  --   --  159* 127*  BILITOT 0.7  --   --  0.7 0.9  PROT 5.9*  --   --  6.2* 6.5  ALBUMIN 3.6   < > 2.8* 3.6 3.8   < > = values in this interval not displayed.   Recent Labs    03/03/23 2124 03/05/23 0748 04/06/23 1326 05/14/23 0948 05/14/23 0954 07/28/23 0847  WBC 10.9*   < > 10.5 9.3  --  6.9  NEUTROABS 9.0*  --  8.0* 7.2  --   --   HGB 13.4   < > 11.9* 12.7 12.9 12.1  HCT 41.0   < > 37.9 39.8 38.0 38.4  MCV 102.2*   < > 101.6* 97.8  --  94.6  PLT 150   < > 191 173  --  173   < > = values in this interval not displayed.   Lab Results  Component Value Date   TSH 1.862 04/08/2023   Lab  Results  Component Value Date   HGBA1C 5.5 03/06/2023   Lab Results  Component Value Date   CHOL 132 04/07/2023   HDL 66 04/07/2023   LDLCALC 57 04/07/2023   LDLDIRECT 47.0 09/04/2015   TRIG 43 04/07/2023   CHOLHDL 2.0 04/07/2023    Significant Diagnostic Results in last 30 days:  No results found.  Assessment/Plan 1. Hematoma of occipital surface of head, initial encounter (Primary) - falls 06/09 & 06/15 - hematoma noted after 1st fall  - hit head with second fall  - refused ED evaluation - on low dose Eliquis   - neuro exam normal  today, asymptomatic  2. Frequent falls - see above - not using walker in room - falls safety precautions discussed - cont PT  3. Seizure (HCC) - no recent episodes - Vimpat  recently increased  - will order f/u EKG to check PR interval  4. Paroxysmal atrial fibrillation (HCC) - HR< 100 without medication - cont low dose Eliquis  - consider stopping DOAC if falls continue     Family/ staff Communication: plan discussed with patient and nurse  Labs/tests ordered:  EKG

## 2023-09-01 DIAGNOSIS — Z4789 Encounter for other orthopedic aftercare: Secondary | ICD-10-CM | POA: Diagnosis not present

## 2023-09-01 DIAGNOSIS — R41841 Cognitive communication deficit: Secondary | ICD-10-CM | POA: Diagnosis not present

## 2023-09-01 DIAGNOSIS — R1312 Dysphagia, oropharyngeal phase: Secondary | ICD-10-CM | POA: Diagnosis not present

## 2023-09-01 DIAGNOSIS — Z9181 History of falling: Secondary | ICD-10-CM | POA: Diagnosis not present

## 2023-09-01 DIAGNOSIS — M6281 Muscle weakness (generalized): Secondary | ICD-10-CM | POA: Diagnosis not present

## 2023-09-01 DIAGNOSIS — S7291XA Unspecified fracture of right femur, initial encounter for closed fracture: Secondary | ICD-10-CM | POA: Diagnosis not present

## 2023-09-02 DIAGNOSIS — S7291XA Unspecified fracture of right femur, initial encounter for closed fracture: Secondary | ICD-10-CM | POA: Diagnosis not present

## 2023-09-02 DIAGNOSIS — R269 Unspecified abnormalities of gait and mobility: Secondary | ICD-10-CM | POA: Diagnosis not present

## 2023-09-02 DIAGNOSIS — R1312 Dysphagia, oropharyngeal phase: Secondary | ICD-10-CM | POA: Diagnosis not present

## 2023-09-02 DIAGNOSIS — M6281 Muscle weakness (generalized): Secondary | ICD-10-CM | POA: Diagnosis not present

## 2023-09-02 DIAGNOSIS — R41841 Cognitive communication deficit: Secondary | ICD-10-CM | POA: Diagnosis not present

## 2023-09-02 DIAGNOSIS — Z4789 Encounter for other orthopedic aftercare: Secondary | ICD-10-CM | POA: Diagnosis not present

## 2023-09-03 DIAGNOSIS — S7291XA Unspecified fracture of right femur, initial encounter for closed fracture: Secondary | ICD-10-CM | POA: Diagnosis not present

## 2023-09-03 DIAGNOSIS — Z4789 Encounter for other orthopedic aftercare: Secondary | ICD-10-CM | POA: Diagnosis not present

## 2023-09-03 DIAGNOSIS — M6281 Muscle weakness (generalized): Secondary | ICD-10-CM | POA: Diagnosis not present

## 2023-09-03 DIAGNOSIS — R41841 Cognitive communication deficit: Secondary | ICD-10-CM | POA: Diagnosis not present

## 2023-09-03 DIAGNOSIS — R1312 Dysphagia, oropharyngeal phase: Secondary | ICD-10-CM | POA: Diagnosis not present

## 2023-09-03 DIAGNOSIS — Z9181 History of falling: Secondary | ICD-10-CM | POA: Diagnosis not present

## 2023-09-04 DIAGNOSIS — R1312 Dysphagia, oropharyngeal phase: Secondary | ICD-10-CM | POA: Diagnosis not present

## 2023-09-04 DIAGNOSIS — S7291XA Unspecified fracture of right femur, initial encounter for closed fracture: Secondary | ICD-10-CM | POA: Diagnosis not present

## 2023-09-04 DIAGNOSIS — R41841 Cognitive communication deficit: Secondary | ICD-10-CM | POA: Diagnosis not present

## 2023-09-04 DIAGNOSIS — M6281 Muscle weakness (generalized): Secondary | ICD-10-CM | POA: Diagnosis not present

## 2023-09-04 DIAGNOSIS — Z9181 History of falling: Secondary | ICD-10-CM | POA: Diagnosis not present

## 2023-09-04 DIAGNOSIS — Z4789 Encounter for other orthopedic aftercare: Secondary | ICD-10-CM | POA: Diagnosis not present

## 2023-09-04 DIAGNOSIS — R269 Unspecified abnormalities of gait and mobility: Secondary | ICD-10-CM | POA: Diagnosis not present

## 2023-09-07 DIAGNOSIS — R269 Unspecified abnormalities of gait and mobility: Secondary | ICD-10-CM | POA: Diagnosis not present

## 2023-09-07 DIAGNOSIS — Z4789 Encounter for other orthopedic aftercare: Secondary | ICD-10-CM | POA: Diagnosis not present

## 2023-09-07 DIAGNOSIS — R41841 Cognitive communication deficit: Secondary | ICD-10-CM | POA: Diagnosis not present

## 2023-09-07 DIAGNOSIS — R1312 Dysphagia, oropharyngeal phase: Secondary | ICD-10-CM | POA: Diagnosis not present

## 2023-09-07 DIAGNOSIS — M6281 Muscle weakness (generalized): Secondary | ICD-10-CM | POA: Diagnosis not present

## 2023-09-07 DIAGNOSIS — S7291XA Unspecified fracture of right femur, initial encounter for closed fracture: Secondary | ICD-10-CM | POA: Diagnosis not present

## 2023-09-08 DIAGNOSIS — R41841 Cognitive communication deficit: Secondary | ICD-10-CM | POA: Diagnosis not present

## 2023-09-08 DIAGNOSIS — R1312 Dysphagia, oropharyngeal phase: Secondary | ICD-10-CM | POA: Diagnosis not present

## 2023-09-08 DIAGNOSIS — Z9181 History of falling: Secondary | ICD-10-CM | POA: Diagnosis not present

## 2023-09-08 DIAGNOSIS — Z4789 Encounter for other orthopedic aftercare: Secondary | ICD-10-CM | POA: Diagnosis not present

## 2023-09-08 DIAGNOSIS — S7291XA Unspecified fracture of right femur, initial encounter for closed fracture: Secondary | ICD-10-CM | POA: Diagnosis not present

## 2023-09-08 DIAGNOSIS — M6281 Muscle weakness (generalized): Secondary | ICD-10-CM | POA: Diagnosis not present

## 2023-09-09 DIAGNOSIS — R41841 Cognitive communication deficit: Secondary | ICD-10-CM | POA: Diagnosis not present

## 2023-09-09 DIAGNOSIS — R1312 Dysphagia, oropharyngeal phase: Secondary | ICD-10-CM | POA: Diagnosis not present

## 2023-09-09 DIAGNOSIS — R269 Unspecified abnormalities of gait and mobility: Secondary | ICD-10-CM | POA: Diagnosis not present

## 2023-09-09 DIAGNOSIS — S7291XA Unspecified fracture of right femur, initial encounter for closed fracture: Secondary | ICD-10-CM | POA: Diagnosis not present

## 2023-09-09 DIAGNOSIS — M6281 Muscle weakness (generalized): Secondary | ICD-10-CM | POA: Diagnosis not present

## 2023-09-09 DIAGNOSIS — Z4789 Encounter for other orthopedic aftercare: Secondary | ICD-10-CM | POA: Diagnosis not present

## 2023-09-10 DIAGNOSIS — Z9181 History of falling: Secondary | ICD-10-CM | POA: Diagnosis not present

## 2023-09-10 DIAGNOSIS — M6281 Muscle weakness (generalized): Secondary | ICD-10-CM | POA: Diagnosis not present

## 2023-09-10 DIAGNOSIS — Z4789 Encounter for other orthopedic aftercare: Secondary | ICD-10-CM | POA: Diagnosis not present

## 2023-09-11 DIAGNOSIS — R269 Unspecified abnormalities of gait and mobility: Secondary | ICD-10-CM | POA: Diagnosis not present

## 2023-09-11 DIAGNOSIS — Z4789 Encounter for other orthopedic aftercare: Secondary | ICD-10-CM | POA: Diagnosis not present

## 2023-09-11 DIAGNOSIS — R1312 Dysphagia, oropharyngeal phase: Secondary | ICD-10-CM | POA: Diagnosis not present

## 2023-09-11 DIAGNOSIS — M6281 Muscle weakness (generalized): Secondary | ICD-10-CM | POA: Diagnosis not present

## 2023-09-11 DIAGNOSIS — R41841 Cognitive communication deficit: Secondary | ICD-10-CM | POA: Diagnosis not present

## 2023-09-11 DIAGNOSIS — S7291XA Unspecified fracture of right femur, initial encounter for closed fracture: Secondary | ICD-10-CM | POA: Diagnosis not present

## 2023-09-11 DIAGNOSIS — Z9181 History of falling: Secondary | ICD-10-CM | POA: Diagnosis not present

## 2023-09-14 DIAGNOSIS — S7291XA Unspecified fracture of right femur, initial encounter for closed fracture: Secondary | ICD-10-CM | POA: Diagnosis not present

## 2023-09-14 DIAGNOSIS — H34831 Tributary (branch) retinal vein occlusion, right eye, with macular edema: Secondary | ICD-10-CM | POA: Diagnosis not present

## 2023-09-14 DIAGNOSIS — R41841 Cognitive communication deficit: Secondary | ICD-10-CM | POA: Diagnosis not present

## 2023-09-14 DIAGNOSIS — R1312 Dysphagia, oropharyngeal phase: Secondary | ICD-10-CM | POA: Diagnosis not present

## 2023-09-15 DIAGNOSIS — S7291XA Unspecified fracture of right femur, initial encounter for closed fracture: Secondary | ICD-10-CM | POA: Diagnosis not present

## 2023-09-15 DIAGNOSIS — R41841 Cognitive communication deficit: Secondary | ICD-10-CM | POA: Diagnosis not present

## 2023-09-15 DIAGNOSIS — R1312 Dysphagia, oropharyngeal phase: Secondary | ICD-10-CM | POA: Diagnosis not present

## 2023-09-16 DIAGNOSIS — Z4789 Encounter for other orthopedic aftercare: Secondary | ICD-10-CM | POA: Diagnosis not present

## 2023-09-16 DIAGNOSIS — M6281 Muscle weakness (generalized): Secondary | ICD-10-CM | POA: Diagnosis not present

## 2023-09-16 DIAGNOSIS — Z9181 History of falling: Secondary | ICD-10-CM | POA: Diagnosis not present

## 2023-09-16 DIAGNOSIS — R269 Unspecified abnormalities of gait and mobility: Secondary | ICD-10-CM | POA: Diagnosis not present

## 2023-09-17 DIAGNOSIS — R41841 Cognitive communication deficit: Secondary | ICD-10-CM | POA: Diagnosis not present

## 2023-09-17 DIAGNOSIS — R1312 Dysphagia, oropharyngeal phase: Secondary | ICD-10-CM | POA: Diagnosis not present

## 2023-09-17 DIAGNOSIS — Z9181 History of falling: Secondary | ICD-10-CM | POA: Diagnosis not present

## 2023-09-17 DIAGNOSIS — Z4789 Encounter for other orthopedic aftercare: Secondary | ICD-10-CM | POA: Diagnosis not present

## 2023-09-17 DIAGNOSIS — R269 Unspecified abnormalities of gait and mobility: Secondary | ICD-10-CM | POA: Diagnosis not present

## 2023-09-17 DIAGNOSIS — S7291XA Unspecified fracture of right femur, initial encounter for closed fracture: Secondary | ICD-10-CM | POA: Diagnosis not present

## 2023-09-17 DIAGNOSIS — M6281 Muscle weakness (generalized): Secondary | ICD-10-CM | POA: Diagnosis not present

## 2023-09-18 DIAGNOSIS — R269 Unspecified abnormalities of gait and mobility: Secondary | ICD-10-CM | POA: Diagnosis not present

## 2023-09-18 DIAGNOSIS — S7291XA Unspecified fracture of right femur, initial encounter for closed fracture: Secondary | ICD-10-CM | POA: Diagnosis not present

## 2023-09-18 DIAGNOSIS — M6281 Muscle weakness (generalized): Secondary | ICD-10-CM | POA: Diagnosis not present

## 2023-09-18 DIAGNOSIS — R41841 Cognitive communication deficit: Secondary | ICD-10-CM | POA: Diagnosis not present

## 2023-09-18 DIAGNOSIS — R1312 Dysphagia, oropharyngeal phase: Secondary | ICD-10-CM | POA: Diagnosis not present

## 2023-09-18 DIAGNOSIS — Z4789 Encounter for other orthopedic aftercare: Secondary | ICD-10-CM | POA: Diagnosis not present

## 2023-09-20 DIAGNOSIS — M6281 Muscle weakness (generalized): Secondary | ICD-10-CM | POA: Diagnosis not present

## 2023-09-20 DIAGNOSIS — Z4789 Encounter for other orthopedic aftercare: Secondary | ICD-10-CM | POA: Diagnosis not present

## 2023-09-20 DIAGNOSIS — Z9181 History of falling: Secondary | ICD-10-CM | POA: Diagnosis not present

## 2023-09-21 DIAGNOSIS — S7291XA Unspecified fracture of right femur, initial encounter for closed fracture: Secondary | ICD-10-CM | POA: Diagnosis not present

## 2023-09-21 DIAGNOSIS — R41841 Cognitive communication deficit: Secondary | ICD-10-CM | POA: Diagnosis not present

## 2023-09-21 DIAGNOSIS — R269 Unspecified abnormalities of gait and mobility: Secondary | ICD-10-CM | POA: Diagnosis not present

## 2023-09-21 DIAGNOSIS — Z4789 Encounter for other orthopedic aftercare: Secondary | ICD-10-CM | POA: Diagnosis not present

## 2023-09-21 DIAGNOSIS — M6281 Muscle weakness (generalized): Secondary | ICD-10-CM | POA: Diagnosis not present

## 2023-09-21 DIAGNOSIS — R1312 Dysphagia, oropharyngeal phase: Secondary | ICD-10-CM | POA: Diagnosis not present

## 2023-09-22 DIAGNOSIS — Z9181 History of falling: Secondary | ICD-10-CM | POA: Diagnosis not present

## 2023-09-22 DIAGNOSIS — M6281 Muscle weakness (generalized): Secondary | ICD-10-CM | POA: Diagnosis not present

## 2023-09-22 DIAGNOSIS — Z4789 Encounter for other orthopedic aftercare: Secondary | ICD-10-CM | POA: Diagnosis not present

## 2023-09-23 DIAGNOSIS — R269 Unspecified abnormalities of gait and mobility: Secondary | ICD-10-CM | POA: Diagnosis not present

## 2023-09-23 DIAGNOSIS — H34831 Tributary (branch) retinal vein occlusion, right eye, with macular edema: Secondary | ICD-10-CM | POA: Diagnosis not present

## 2023-09-23 DIAGNOSIS — H35363 Drusen (degenerative) of macula, bilateral: Secondary | ICD-10-CM | POA: Diagnosis not present

## 2023-09-23 DIAGNOSIS — Z4789 Encounter for other orthopedic aftercare: Secondary | ICD-10-CM | POA: Diagnosis not present

## 2023-09-23 DIAGNOSIS — M6281 Muscle weakness (generalized): Secondary | ICD-10-CM | POA: Diagnosis not present

## 2023-09-23 DIAGNOSIS — S7291XA Unspecified fracture of right femur, initial encounter for closed fracture: Secondary | ICD-10-CM | POA: Diagnosis not present

## 2023-09-23 DIAGNOSIS — R1312 Dysphagia, oropharyngeal phase: Secondary | ICD-10-CM | POA: Diagnosis not present

## 2023-09-23 DIAGNOSIS — H353222 Exudative age-related macular degeneration, left eye, with inactive choroidal neovascularization: Secondary | ICD-10-CM | POA: Diagnosis not present

## 2023-09-23 DIAGNOSIS — R41841 Cognitive communication deficit: Secondary | ICD-10-CM | POA: Diagnosis not present

## 2023-09-23 DIAGNOSIS — H3562 Retinal hemorrhage, left eye: Secondary | ICD-10-CM | POA: Diagnosis not present

## 2023-09-24 DIAGNOSIS — M6281 Muscle weakness (generalized): Secondary | ICD-10-CM | POA: Diagnosis not present

## 2023-09-24 DIAGNOSIS — R1312 Dysphagia, oropharyngeal phase: Secondary | ICD-10-CM | POA: Diagnosis not present

## 2023-09-24 DIAGNOSIS — S7291XA Unspecified fracture of right femur, initial encounter for closed fracture: Secondary | ICD-10-CM | POA: Diagnosis not present

## 2023-09-24 DIAGNOSIS — R41841 Cognitive communication deficit: Secondary | ICD-10-CM | POA: Diagnosis not present

## 2023-09-24 DIAGNOSIS — Z4789 Encounter for other orthopedic aftercare: Secondary | ICD-10-CM | POA: Diagnosis not present

## 2023-09-24 DIAGNOSIS — Z9181 History of falling: Secondary | ICD-10-CM | POA: Diagnosis not present

## 2023-09-25 DIAGNOSIS — R269 Unspecified abnormalities of gait and mobility: Secondary | ICD-10-CM | POA: Diagnosis not present

## 2023-09-25 DIAGNOSIS — Z4789 Encounter for other orthopedic aftercare: Secondary | ICD-10-CM | POA: Diagnosis not present

## 2023-09-25 DIAGNOSIS — R1312 Dysphagia, oropharyngeal phase: Secondary | ICD-10-CM | POA: Diagnosis not present

## 2023-09-25 DIAGNOSIS — M6281 Muscle weakness (generalized): Secondary | ICD-10-CM | POA: Diagnosis not present

## 2023-09-25 DIAGNOSIS — S7291XA Unspecified fracture of right femur, initial encounter for closed fracture: Secondary | ICD-10-CM | POA: Diagnosis not present

## 2023-09-25 DIAGNOSIS — R41841 Cognitive communication deficit: Secondary | ICD-10-CM | POA: Diagnosis not present

## 2023-09-28 DIAGNOSIS — R269 Unspecified abnormalities of gait and mobility: Secondary | ICD-10-CM | POA: Diagnosis not present

## 2023-09-28 DIAGNOSIS — R41841 Cognitive communication deficit: Secondary | ICD-10-CM | POA: Diagnosis not present

## 2023-09-28 DIAGNOSIS — R1312 Dysphagia, oropharyngeal phase: Secondary | ICD-10-CM | POA: Diagnosis not present

## 2023-09-28 DIAGNOSIS — M6281 Muscle weakness (generalized): Secondary | ICD-10-CM | POA: Diagnosis not present

## 2023-09-28 DIAGNOSIS — S7291XA Unspecified fracture of right femur, initial encounter for closed fracture: Secondary | ICD-10-CM | POA: Diagnosis not present

## 2023-09-28 DIAGNOSIS — Z4789 Encounter for other orthopedic aftercare: Secondary | ICD-10-CM | POA: Diagnosis not present

## 2023-09-28 DIAGNOSIS — Z9181 History of falling: Secondary | ICD-10-CM | POA: Diagnosis not present

## 2023-09-29 DIAGNOSIS — S7291XA Unspecified fracture of right femur, initial encounter for closed fracture: Secondary | ICD-10-CM | POA: Diagnosis not present

## 2023-09-29 DIAGNOSIS — R1312 Dysphagia, oropharyngeal phase: Secondary | ICD-10-CM | POA: Diagnosis not present

## 2023-09-29 DIAGNOSIS — R41841 Cognitive communication deficit: Secondary | ICD-10-CM | POA: Diagnosis not present

## 2023-09-30 DIAGNOSIS — Z4789 Encounter for other orthopedic aftercare: Secondary | ICD-10-CM | POA: Diagnosis not present

## 2023-09-30 DIAGNOSIS — R269 Unspecified abnormalities of gait and mobility: Secondary | ICD-10-CM | POA: Diagnosis not present

## 2023-09-30 DIAGNOSIS — M6281 Muscle weakness (generalized): Secondary | ICD-10-CM | POA: Diagnosis not present

## 2023-09-30 DIAGNOSIS — Z9181 History of falling: Secondary | ICD-10-CM | POA: Diagnosis not present

## 2023-10-01 DIAGNOSIS — R41841 Cognitive communication deficit: Secondary | ICD-10-CM | POA: Diagnosis not present

## 2023-10-01 DIAGNOSIS — M6281 Muscle weakness (generalized): Secondary | ICD-10-CM | POA: Diagnosis not present

## 2023-10-01 DIAGNOSIS — S7291XA Unspecified fracture of right femur, initial encounter for closed fracture: Secondary | ICD-10-CM | POA: Diagnosis not present

## 2023-10-01 DIAGNOSIS — R1312 Dysphagia, oropharyngeal phase: Secondary | ICD-10-CM | POA: Diagnosis not present

## 2023-10-01 DIAGNOSIS — Z4789 Encounter for other orthopedic aftercare: Secondary | ICD-10-CM | POA: Diagnosis not present

## 2023-10-01 DIAGNOSIS — Z9181 History of falling: Secondary | ICD-10-CM | POA: Diagnosis not present

## 2023-10-02 ENCOUNTER — Other Ambulatory Visit: Payer: Self-pay | Admitting: Orthopedic Surgery

## 2023-10-02 DIAGNOSIS — R269 Unspecified abnormalities of gait and mobility: Secondary | ICD-10-CM | POA: Diagnosis not present

## 2023-10-02 DIAGNOSIS — R41841 Cognitive communication deficit: Secondary | ICD-10-CM | POA: Diagnosis not present

## 2023-10-02 DIAGNOSIS — Z4789 Encounter for other orthopedic aftercare: Secondary | ICD-10-CM | POA: Diagnosis not present

## 2023-10-02 DIAGNOSIS — R569 Unspecified convulsions: Secondary | ICD-10-CM

## 2023-10-02 DIAGNOSIS — S7291XA Unspecified fracture of right femur, initial encounter for closed fracture: Secondary | ICD-10-CM | POA: Diagnosis not present

## 2023-10-02 DIAGNOSIS — R1312 Dysphagia, oropharyngeal phase: Secondary | ICD-10-CM | POA: Diagnosis not present

## 2023-10-02 DIAGNOSIS — M6281 Muscle weakness (generalized): Secondary | ICD-10-CM | POA: Diagnosis not present

## 2023-10-02 MED ORDER — LACOSAMIDE 100 MG PO TABS
1.0000 | ORAL_TABLET | Freq: Two times a day (BID) | ORAL | 0 refills | Status: DC
Start: 2023-10-02 — End: 2023-11-02

## 2023-10-05 DIAGNOSIS — R1312 Dysphagia, oropharyngeal phase: Secondary | ICD-10-CM | POA: Diagnosis not present

## 2023-10-05 DIAGNOSIS — Z4789 Encounter for other orthopedic aftercare: Secondary | ICD-10-CM | POA: Diagnosis not present

## 2023-10-05 DIAGNOSIS — S7291XA Unspecified fracture of right femur, initial encounter for closed fracture: Secondary | ICD-10-CM | POA: Diagnosis not present

## 2023-10-05 DIAGNOSIS — R269 Unspecified abnormalities of gait and mobility: Secondary | ICD-10-CM | POA: Diagnosis not present

## 2023-10-05 DIAGNOSIS — R41841 Cognitive communication deficit: Secondary | ICD-10-CM | POA: Diagnosis not present

## 2023-10-05 DIAGNOSIS — M6281 Muscle weakness (generalized): Secondary | ICD-10-CM | POA: Diagnosis not present

## 2023-10-06 DIAGNOSIS — M6281 Muscle weakness (generalized): Secondary | ICD-10-CM | POA: Diagnosis not present

## 2023-10-06 DIAGNOSIS — Z4789 Encounter for other orthopedic aftercare: Secondary | ICD-10-CM | POA: Diagnosis not present

## 2023-10-06 DIAGNOSIS — R41841 Cognitive communication deficit: Secondary | ICD-10-CM | POA: Diagnosis not present

## 2023-10-06 DIAGNOSIS — S7291XA Unspecified fracture of right femur, initial encounter for closed fracture: Secondary | ICD-10-CM | POA: Diagnosis not present

## 2023-10-06 DIAGNOSIS — Z9181 History of falling: Secondary | ICD-10-CM | POA: Diagnosis not present

## 2023-10-06 DIAGNOSIS — R1312 Dysphagia, oropharyngeal phase: Secondary | ICD-10-CM | POA: Diagnosis not present

## 2023-10-07 DIAGNOSIS — R1312 Dysphagia, oropharyngeal phase: Secondary | ICD-10-CM | POA: Diagnosis not present

## 2023-10-07 DIAGNOSIS — S7291XA Unspecified fracture of right femur, initial encounter for closed fracture: Secondary | ICD-10-CM | POA: Diagnosis not present

## 2023-10-07 DIAGNOSIS — Z4789 Encounter for other orthopedic aftercare: Secondary | ICD-10-CM | POA: Diagnosis not present

## 2023-10-07 DIAGNOSIS — M6281 Muscle weakness (generalized): Secondary | ICD-10-CM | POA: Diagnosis not present

## 2023-10-07 DIAGNOSIS — R41841 Cognitive communication deficit: Secondary | ICD-10-CM | POA: Diagnosis not present

## 2023-10-07 DIAGNOSIS — R269 Unspecified abnormalities of gait and mobility: Secondary | ICD-10-CM | POA: Diagnosis not present

## 2023-10-08 DIAGNOSIS — Z9181 History of falling: Secondary | ICD-10-CM | POA: Diagnosis not present

## 2023-10-08 DIAGNOSIS — R41841 Cognitive communication deficit: Secondary | ICD-10-CM | POA: Diagnosis not present

## 2023-10-08 DIAGNOSIS — Z4789 Encounter for other orthopedic aftercare: Secondary | ICD-10-CM | POA: Diagnosis not present

## 2023-10-08 DIAGNOSIS — R1312 Dysphagia, oropharyngeal phase: Secondary | ICD-10-CM | POA: Diagnosis not present

## 2023-10-08 DIAGNOSIS — M6281 Muscle weakness (generalized): Secondary | ICD-10-CM | POA: Diagnosis not present

## 2023-10-08 DIAGNOSIS — S7291XA Unspecified fracture of right femur, initial encounter for closed fracture: Secondary | ICD-10-CM | POA: Diagnosis not present

## 2023-10-09 DIAGNOSIS — Z4789 Encounter for other orthopedic aftercare: Secondary | ICD-10-CM | POA: Diagnosis not present

## 2023-10-09 DIAGNOSIS — R269 Unspecified abnormalities of gait and mobility: Secondary | ICD-10-CM | POA: Diagnosis not present

## 2023-10-09 DIAGNOSIS — M6281 Muscle weakness (generalized): Secondary | ICD-10-CM | POA: Diagnosis not present

## 2023-10-12 DIAGNOSIS — R41841 Cognitive communication deficit: Secondary | ICD-10-CM | POA: Diagnosis not present

## 2023-10-12 DIAGNOSIS — Z4789 Encounter for other orthopedic aftercare: Secondary | ICD-10-CM | POA: Diagnosis not present

## 2023-10-12 DIAGNOSIS — S7291XA Unspecified fracture of right femur, initial encounter for closed fracture: Secondary | ICD-10-CM | POA: Diagnosis not present

## 2023-10-12 DIAGNOSIS — M6281 Muscle weakness (generalized): Secondary | ICD-10-CM | POA: Diagnosis not present

## 2023-10-12 DIAGNOSIS — R1312 Dysphagia, oropharyngeal phase: Secondary | ICD-10-CM | POA: Diagnosis not present

## 2023-10-12 DIAGNOSIS — R269 Unspecified abnormalities of gait and mobility: Secondary | ICD-10-CM | POA: Diagnosis not present

## 2023-10-13 DIAGNOSIS — R41841 Cognitive communication deficit: Secondary | ICD-10-CM | POA: Diagnosis not present

## 2023-10-13 DIAGNOSIS — M6281 Muscle weakness (generalized): Secondary | ICD-10-CM | POA: Diagnosis not present

## 2023-10-13 DIAGNOSIS — S7291XA Unspecified fracture of right femur, initial encounter for closed fracture: Secondary | ICD-10-CM | POA: Diagnosis not present

## 2023-10-13 DIAGNOSIS — Z4789 Encounter for other orthopedic aftercare: Secondary | ICD-10-CM | POA: Diagnosis not present

## 2023-10-13 DIAGNOSIS — Z9181 History of falling: Secondary | ICD-10-CM | POA: Diagnosis not present

## 2023-10-13 DIAGNOSIS — R1312 Dysphagia, oropharyngeal phase: Secondary | ICD-10-CM | POA: Diagnosis not present

## 2023-10-14 DIAGNOSIS — M6281 Muscle weakness (generalized): Secondary | ICD-10-CM | POA: Diagnosis not present

## 2023-10-14 DIAGNOSIS — S7291XA Unspecified fracture of right femur, initial encounter for closed fracture: Secondary | ICD-10-CM | POA: Diagnosis not present

## 2023-10-14 DIAGNOSIS — R41841 Cognitive communication deficit: Secondary | ICD-10-CM | POA: Diagnosis not present

## 2023-10-14 DIAGNOSIS — Z9181 History of falling: Secondary | ICD-10-CM | POA: Diagnosis not present

## 2023-10-14 DIAGNOSIS — Z4789 Encounter for other orthopedic aftercare: Secondary | ICD-10-CM | POA: Diagnosis not present

## 2023-10-14 DIAGNOSIS — R1312 Dysphagia, oropharyngeal phase: Secondary | ICD-10-CM | POA: Diagnosis not present

## 2023-10-14 DIAGNOSIS — R269 Unspecified abnormalities of gait and mobility: Secondary | ICD-10-CM | POA: Diagnosis not present

## 2023-10-15 DIAGNOSIS — Z4789 Encounter for other orthopedic aftercare: Secondary | ICD-10-CM | POA: Diagnosis not present

## 2023-10-15 DIAGNOSIS — Z9181 History of falling: Secondary | ICD-10-CM | POA: Diagnosis not present

## 2023-10-15 DIAGNOSIS — S7291XA Unspecified fracture of right femur, initial encounter for closed fracture: Secondary | ICD-10-CM | POA: Diagnosis not present

## 2023-10-15 DIAGNOSIS — R1312 Dysphagia, oropharyngeal phase: Secondary | ICD-10-CM | POA: Diagnosis not present

## 2023-10-15 DIAGNOSIS — M6281 Muscle weakness (generalized): Secondary | ICD-10-CM | POA: Diagnosis not present

## 2023-10-15 DIAGNOSIS — R41841 Cognitive communication deficit: Secondary | ICD-10-CM | POA: Diagnosis not present

## 2023-10-16 DIAGNOSIS — Z4789 Encounter for other orthopedic aftercare: Secondary | ICD-10-CM | POA: Diagnosis not present

## 2023-10-16 DIAGNOSIS — R269 Unspecified abnormalities of gait and mobility: Secondary | ICD-10-CM | POA: Diagnosis not present

## 2023-10-16 DIAGNOSIS — M6281 Muscle weakness (generalized): Secondary | ICD-10-CM | POA: Diagnosis not present

## 2023-10-19 DIAGNOSIS — R41841 Cognitive communication deficit: Secondary | ICD-10-CM | POA: Diagnosis not present

## 2023-10-19 DIAGNOSIS — R1312 Dysphagia, oropharyngeal phase: Secondary | ICD-10-CM | POA: Diagnosis not present

## 2023-10-19 DIAGNOSIS — S7291XA Unspecified fracture of right femur, initial encounter for closed fracture: Secondary | ICD-10-CM | POA: Diagnosis not present

## 2023-10-19 DIAGNOSIS — R269 Unspecified abnormalities of gait and mobility: Secondary | ICD-10-CM | POA: Diagnosis not present

## 2023-10-19 DIAGNOSIS — Z4789 Encounter for other orthopedic aftercare: Secondary | ICD-10-CM | POA: Diagnosis not present

## 2023-10-19 DIAGNOSIS — H34831 Tributary (branch) retinal vein occlusion, right eye, with macular edema: Secondary | ICD-10-CM | POA: Diagnosis not present

## 2023-10-19 DIAGNOSIS — M6281 Muscle weakness (generalized): Secondary | ICD-10-CM | POA: Diagnosis not present

## 2023-10-20 DIAGNOSIS — M6281 Muscle weakness (generalized): Secondary | ICD-10-CM | POA: Diagnosis not present

## 2023-10-20 DIAGNOSIS — R1312 Dysphagia, oropharyngeal phase: Secondary | ICD-10-CM | POA: Diagnosis not present

## 2023-10-20 DIAGNOSIS — Z4789 Encounter for other orthopedic aftercare: Secondary | ICD-10-CM | POA: Diagnosis not present

## 2023-10-20 DIAGNOSIS — S7291XA Unspecified fracture of right femur, initial encounter for closed fracture: Secondary | ICD-10-CM | POA: Diagnosis not present

## 2023-10-20 DIAGNOSIS — R41841 Cognitive communication deficit: Secondary | ICD-10-CM | POA: Diagnosis not present

## 2023-10-20 DIAGNOSIS — Z9181 History of falling: Secondary | ICD-10-CM | POA: Diagnosis not present

## 2023-10-21 DIAGNOSIS — Z4789 Encounter for other orthopedic aftercare: Secondary | ICD-10-CM | POA: Diagnosis not present

## 2023-10-21 DIAGNOSIS — M6281 Muscle weakness (generalized): Secondary | ICD-10-CM | POA: Diagnosis not present

## 2023-10-21 DIAGNOSIS — R269 Unspecified abnormalities of gait and mobility: Secondary | ICD-10-CM | POA: Diagnosis not present

## 2023-10-21 DIAGNOSIS — Z9181 History of falling: Secondary | ICD-10-CM | POA: Diagnosis not present

## 2023-10-22 DIAGNOSIS — Z9181 History of falling: Secondary | ICD-10-CM | POA: Diagnosis not present

## 2023-10-22 DIAGNOSIS — M6281 Muscle weakness (generalized): Secondary | ICD-10-CM | POA: Diagnosis not present

## 2023-10-22 DIAGNOSIS — R41841 Cognitive communication deficit: Secondary | ICD-10-CM | POA: Diagnosis not present

## 2023-10-22 DIAGNOSIS — R1312 Dysphagia, oropharyngeal phase: Secondary | ICD-10-CM | POA: Diagnosis not present

## 2023-10-22 DIAGNOSIS — Z4789 Encounter for other orthopedic aftercare: Secondary | ICD-10-CM | POA: Diagnosis not present

## 2023-10-22 DIAGNOSIS — S7291XA Unspecified fracture of right femur, initial encounter for closed fracture: Secondary | ICD-10-CM | POA: Diagnosis not present

## 2023-10-23 DIAGNOSIS — R269 Unspecified abnormalities of gait and mobility: Secondary | ICD-10-CM | POA: Diagnosis not present

## 2023-10-23 DIAGNOSIS — R1312 Dysphagia, oropharyngeal phase: Secondary | ICD-10-CM | POA: Diagnosis not present

## 2023-10-23 DIAGNOSIS — Z4789 Encounter for other orthopedic aftercare: Secondary | ICD-10-CM | POA: Diagnosis not present

## 2023-10-23 DIAGNOSIS — M6281 Muscle weakness (generalized): Secondary | ICD-10-CM | POA: Diagnosis not present

## 2023-10-23 DIAGNOSIS — R41841 Cognitive communication deficit: Secondary | ICD-10-CM | POA: Diagnosis not present

## 2023-10-23 DIAGNOSIS — S7291XA Unspecified fracture of right femur, initial encounter for closed fracture: Secondary | ICD-10-CM | POA: Diagnosis not present

## 2023-10-26 DIAGNOSIS — S7291XA Unspecified fracture of right femur, initial encounter for closed fracture: Secondary | ICD-10-CM | POA: Diagnosis not present

## 2023-10-26 DIAGNOSIS — R41841 Cognitive communication deficit: Secondary | ICD-10-CM | POA: Diagnosis not present

## 2023-10-26 DIAGNOSIS — R269 Unspecified abnormalities of gait and mobility: Secondary | ICD-10-CM | POA: Diagnosis not present

## 2023-10-26 DIAGNOSIS — M6281 Muscle weakness (generalized): Secondary | ICD-10-CM | POA: Diagnosis not present

## 2023-10-26 DIAGNOSIS — Z4789 Encounter for other orthopedic aftercare: Secondary | ICD-10-CM | POA: Diagnosis not present

## 2023-10-26 DIAGNOSIS — R1312 Dysphagia, oropharyngeal phase: Secondary | ICD-10-CM | POA: Diagnosis not present

## 2023-10-26 DIAGNOSIS — Z9181 History of falling: Secondary | ICD-10-CM | POA: Diagnosis not present

## 2023-10-27 DIAGNOSIS — S7291XA Unspecified fracture of right femur, initial encounter for closed fracture: Secondary | ICD-10-CM | POA: Diagnosis not present

## 2023-10-27 DIAGNOSIS — R1312 Dysphagia, oropharyngeal phase: Secondary | ICD-10-CM | POA: Diagnosis not present

## 2023-10-27 DIAGNOSIS — R41841 Cognitive communication deficit: Secondary | ICD-10-CM | POA: Diagnosis not present

## 2023-10-28 DIAGNOSIS — S7291XA Unspecified fracture of right femur, initial encounter for closed fracture: Secondary | ICD-10-CM | POA: Diagnosis not present

## 2023-10-28 DIAGNOSIS — Z9181 History of falling: Secondary | ICD-10-CM | POA: Diagnosis not present

## 2023-10-28 DIAGNOSIS — R1312 Dysphagia, oropharyngeal phase: Secondary | ICD-10-CM | POA: Diagnosis not present

## 2023-10-28 DIAGNOSIS — M6281 Muscle weakness (generalized): Secondary | ICD-10-CM | POA: Diagnosis not present

## 2023-10-28 DIAGNOSIS — R269 Unspecified abnormalities of gait and mobility: Secondary | ICD-10-CM | POA: Diagnosis not present

## 2023-10-28 DIAGNOSIS — R41841 Cognitive communication deficit: Secondary | ICD-10-CM | POA: Diagnosis not present

## 2023-10-28 DIAGNOSIS — Z4789 Encounter for other orthopedic aftercare: Secondary | ICD-10-CM | POA: Diagnosis not present

## 2023-10-29 DIAGNOSIS — M6281 Muscle weakness (generalized): Secondary | ICD-10-CM | POA: Diagnosis not present

## 2023-10-29 DIAGNOSIS — Z4789 Encounter for other orthopedic aftercare: Secondary | ICD-10-CM | POA: Diagnosis not present

## 2023-10-29 DIAGNOSIS — Z9181 History of falling: Secondary | ICD-10-CM | POA: Diagnosis not present

## 2023-10-30 DIAGNOSIS — M6281 Muscle weakness (generalized): Secondary | ICD-10-CM | POA: Diagnosis not present

## 2023-10-30 DIAGNOSIS — Z4789 Encounter for other orthopedic aftercare: Secondary | ICD-10-CM | POA: Diagnosis not present

## 2023-10-30 DIAGNOSIS — Z9181 History of falling: Secondary | ICD-10-CM | POA: Diagnosis not present

## 2023-10-30 DIAGNOSIS — R269 Unspecified abnormalities of gait and mobility: Secondary | ICD-10-CM | POA: Diagnosis not present

## 2023-11-02 ENCOUNTER — Other Ambulatory Visit: Payer: Self-pay | Admitting: Orthopedic Surgery

## 2023-11-02 DIAGNOSIS — Z4789 Encounter for other orthopedic aftercare: Secondary | ICD-10-CM | POA: Diagnosis not present

## 2023-11-02 DIAGNOSIS — M6281 Muscle weakness (generalized): Secondary | ICD-10-CM | POA: Diagnosis not present

## 2023-11-02 DIAGNOSIS — R569 Unspecified convulsions: Secondary | ICD-10-CM

## 2023-11-02 DIAGNOSIS — R41841 Cognitive communication deficit: Secondary | ICD-10-CM | POA: Diagnosis not present

## 2023-11-02 DIAGNOSIS — S7291XA Unspecified fracture of right femur, initial encounter for closed fracture: Secondary | ICD-10-CM | POA: Diagnosis not present

## 2023-11-02 DIAGNOSIS — R269 Unspecified abnormalities of gait and mobility: Secondary | ICD-10-CM | POA: Diagnosis not present

## 2023-11-02 DIAGNOSIS — R1312 Dysphagia, oropharyngeal phase: Secondary | ICD-10-CM | POA: Diagnosis not present

## 2023-11-02 MED ORDER — LACOSAMIDE 100 MG PO TABS: 1.0000 | ORAL_TABLET | Freq: Two times a day (BID) | ORAL | 0 refills | Status: DC

## 2023-11-04 DIAGNOSIS — Z4789 Encounter for other orthopedic aftercare: Secondary | ICD-10-CM | POA: Diagnosis not present

## 2023-11-04 DIAGNOSIS — S7291XA Unspecified fracture of right femur, initial encounter for closed fracture: Secondary | ICD-10-CM | POA: Diagnosis not present

## 2023-11-04 DIAGNOSIS — R1312 Dysphagia, oropharyngeal phase: Secondary | ICD-10-CM | POA: Diagnosis not present

## 2023-11-04 DIAGNOSIS — M6281 Muscle weakness (generalized): Secondary | ICD-10-CM | POA: Diagnosis not present

## 2023-11-04 DIAGNOSIS — Z9181 History of falling: Secondary | ICD-10-CM | POA: Diagnosis not present

## 2023-11-04 DIAGNOSIS — R41841 Cognitive communication deficit: Secondary | ICD-10-CM | POA: Diagnosis not present

## 2023-11-04 DIAGNOSIS — R269 Unspecified abnormalities of gait and mobility: Secondary | ICD-10-CM | POA: Diagnosis not present

## 2023-11-05 DIAGNOSIS — Z4789 Encounter for other orthopedic aftercare: Secondary | ICD-10-CM | POA: Diagnosis not present

## 2023-11-05 DIAGNOSIS — Z9181 History of falling: Secondary | ICD-10-CM | POA: Diagnosis not present

## 2023-11-05 DIAGNOSIS — M6281 Muscle weakness (generalized): Secondary | ICD-10-CM | POA: Diagnosis not present

## 2023-11-06 DIAGNOSIS — R1312 Dysphagia, oropharyngeal phase: Secondary | ICD-10-CM | POA: Diagnosis not present

## 2023-11-06 DIAGNOSIS — M6281 Muscle weakness (generalized): Secondary | ICD-10-CM | POA: Diagnosis not present

## 2023-11-06 DIAGNOSIS — Z4789 Encounter for other orthopedic aftercare: Secondary | ICD-10-CM | POA: Diagnosis not present

## 2023-11-06 DIAGNOSIS — S7291XA Unspecified fracture of right femur, initial encounter for closed fracture: Secondary | ICD-10-CM | POA: Diagnosis not present

## 2023-11-06 DIAGNOSIS — R269 Unspecified abnormalities of gait and mobility: Secondary | ICD-10-CM | POA: Diagnosis not present

## 2023-11-06 DIAGNOSIS — R41841 Cognitive communication deficit: Secondary | ICD-10-CM | POA: Diagnosis not present

## 2023-11-09 DIAGNOSIS — R1312 Dysphagia, oropharyngeal phase: Secondary | ICD-10-CM | POA: Diagnosis not present

## 2023-11-09 DIAGNOSIS — S7291XA Unspecified fracture of right femur, initial encounter for closed fracture: Secondary | ICD-10-CM | POA: Diagnosis not present

## 2023-11-09 DIAGNOSIS — Z9181 History of falling: Secondary | ICD-10-CM | POA: Diagnosis not present

## 2023-11-09 DIAGNOSIS — Z4789 Encounter for other orthopedic aftercare: Secondary | ICD-10-CM | POA: Diagnosis not present

## 2023-11-09 DIAGNOSIS — R41841 Cognitive communication deficit: Secondary | ICD-10-CM | POA: Diagnosis not present

## 2023-11-09 DIAGNOSIS — R269 Unspecified abnormalities of gait and mobility: Secondary | ICD-10-CM | POA: Diagnosis not present

## 2023-11-09 DIAGNOSIS — M6281 Muscle weakness (generalized): Secondary | ICD-10-CM | POA: Diagnosis not present

## 2023-11-10 DIAGNOSIS — R41841 Cognitive communication deficit: Secondary | ICD-10-CM | POA: Diagnosis not present

## 2023-11-10 DIAGNOSIS — Z9181 History of falling: Secondary | ICD-10-CM | POA: Diagnosis not present

## 2023-11-10 DIAGNOSIS — M6281 Muscle weakness (generalized): Secondary | ICD-10-CM | POA: Diagnosis not present

## 2023-11-10 DIAGNOSIS — M79671 Pain in right foot: Secondary | ICD-10-CM | POA: Diagnosis not present

## 2023-11-10 DIAGNOSIS — B351 Tinea unguium: Secondary | ICD-10-CM | POA: Diagnosis not present

## 2023-11-10 DIAGNOSIS — Z4789 Encounter for other orthopedic aftercare: Secondary | ICD-10-CM | POA: Diagnosis not present

## 2023-11-10 DIAGNOSIS — S7291XA Unspecified fracture of right femur, initial encounter for closed fracture: Secondary | ICD-10-CM | POA: Diagnosis not present

## 2023-11-10 DIAGNOSIS — M79672 Pain in left foot: Secondary | ICD-10-CM | POA: Diagnosis not present

## 2023-11-10 DIAGNOSIS — R1312 Dysphagia, oropharyngeal phase: Secondary | ICD-10-CM | POA: Diagnosis not present

## 2023-11-11 DIAGNOSIS — Z4789 Encounter for other orthopedic aftercare: Secondary | ICD-10-CM | POA: Diagnosis not present

## 2023-11-11 DIAGNOSIS — M6281 Muscle weakness (generalized): Secondary | ICD-10-CM | POA: Diagnosis not present

## 2023-11-11 DIAGNOSIS — R1312 Dysphagia, oropharyngeal phase: Secondary | ICD-10-CM | POA: Diagnosis not present

## 2023-11-11 DIAGNOSIS — S7291XA Unspecified fracture of right femur, initial encounter for closed fracture: Secondary | ICD-10-CM | POA: Diagnosis not present

## 2023-11-11 DIAGNOSIS — R41841 Cognitive communication deficit: Secondary | ICD-10-CM | POA: Diagnosis not present

## 2023-11-11 DIAGNOSIS — R269 Unspecified abnormalities of gait and mobility: Secondary | ICD-10-CM | POA: Diagnosis not present

## 2023-11-12 DIAGNOSIS — Z9181 History of falling: Secondary | ICD-10-CM | POA: Diagnosis not present

## 2023-11-12 DIAGNOSIS — R269 Unspecified abnormalities of gait and mobility: Secondary | ICD-10-CM | POA: Diagnosis not present

## 2023-11-12 DIAGNOSIS — R1312 Dysphagia, oropharyngeal phase: Secondary | ICD-10-CM | POA: Diagnosis not present

## 2023-11-12 DIAGNOSIS — S7291XA Unspecified fracture of right femur, initial encounter for closed fracture: Secondary | ICD-10-CM | POA: Diagnosis not present

## 2023-11-12 DIAGNOSIS — Z4789 Encounter for other orthopedic aftercare: Secondary | ICD-10-CM | POA: Diagnosis not present

## 2023-11-12 DIAGNOSIS — R41841 Cognitive communication deficit: Secondary | ICD-10-CM | POA: Diagnosis not present

## 2023-11-12 DIAGNOSIS — M6281 Muscle weakness (generalized): Secondary | ICD-10-CM | POA: Diagnosis not present

## 2023-11-13 DIAGNOSIS — Z4789 Encounter for other orthopedic aftercare: Secondary | ICD-10-CM | POA: Diagnosis not present

## 2023-11-13 DIAGNOSIS — R269 Unspecified abnormalities of gait and mobility: Secondary | ICD-10-CM | POA: Diagnosis not present

## 2023-11-13 DIAGNOSIS — M6281 Muscle weakness (generalized): Secondary | ICD-10-CM | POA: Diagnosis not present

## 2023-11-16 DIAGNOSIS — Z9181 History of falling: Secondary | ICD-10-CM | POA: Diagnosis not present

## 2023-11-16 DIAGNOSIS — Z4789 Encounter for other orthopedic aftercare: Secondary | ICD-10-CM | POA: Diagnosis not present

## 2023-11-16 DIAGNOSIS — M6281 Muscle weakness (generalized): Secondary | ICD-10-CM | POA: Diagnosis not present

## 2023-11-17 ENCOUNTER — Encounter: Payer: Self-pay | Admitting: Adult Health

## 2023-11-17 ENCOUNTER — Non-Acute Institutional Stay: Payer: Self-pay | Admitting: Adult Health

## 2023-11-17 DIAGNOSIS — E034 Atrophy of thyroid (acquired): Secondary | ICD-10-CM | POA: Diagnosis not present

## 2023-11-17 DIAGNOSIS — I480 Paroxysmal atrial fibrillation: Secondary | ICD-10-CM | POA: Diagnosis not present

## 2023-11-17 DIAGNOSIS — R41841 Cognitive communication deficit: Secondary | ICD-10-CM | POA: Diagnosis not present

## 2023-11-17 DIAGNOSIS — S7291XA Unspecified fracture of right femur, initial encounter for closed fracture: Secondary | ICD-10-CM | POA: Diagnosis not present

## 2023-11-17 DIAGNOSIS — R1312 Dysphagia, oropharyngeal phase: Secondary | ICD-10-CM | POA: Diagnosis not present

## 2023-11-17 NOTE — Progress Notes (Signed)
 Location:  [facility] Room Number: AL28-A Place of Service:  ALF [phone]) Provider:  Phyllis Jereld BROCKS, NP  Patient Care Team: Charlanne Fredia CROME, MD as PCP - General (Internal Medicine) Delford Maude BROCKS, MD as PCP - Cardiology (Cardiology) Rosan Credit, MD as Consulting Physician (Ophthalmology) Sharalyn, Janina GRADE, MD as Referring Physician (Ophthalmology) Buck Saucer, MD as Consulting Physician (Neurology) Charlanne Fredia CROME, MD as Consulting Physician (Internal Medicine)  Extended Emergency Contact Information Primary Emergency Contact: Rudi JANEANE Lesches Work Phone: [phone] Mobile Phone: [phone] Relation: Other Secondary Emergency Contact: Heyge,Lorna Mobile Phone: [phone] Relation: Friend Interpreter needed? No  Code Status:  DNR Goals of care: Advanced Directive information    08/19/2023   12:08 PM  Advanced Directives  Does Patient Have a Medical Advance Directive? Yes  Type of Estate agent of [place];Living will  Does patient want to make changes to medical advance directive? No - Patient declined  Copy of Healthcare Power of Attorney in Chart? Yes - validated most recent copy scanned in chart (See row information)  Would patient like information on creating a medical advance directive? No - Patient declined     Chief Complaint  Patient presents with   Fall    Unwitnessed fall and patient is currently on eliquis .    HPI:  Pt is a 89 y.o. female seen today for an acute visit for  an unwitnessed fall. She is a resident of [facility] [address] [facility]. She stated that she was putting her mails in her box in her room this morning when she lost her balance and fell on her buttocks. She stated that she hit the back of her head slightly. No noted bruising nor bump. She denies having headache. She is taking Eliquis  2.5 mg BID for atrial fibrillation. She declined going to ER for evaluation. She stated that she had  fallen before and nothing happened to her.   She has hypothyroidism and takes Levothyroxine  75 mcg daily.   Past Medical History:  Diagnosis Date   Atrial fibrillation (HCC)    AFIB   Collagenous colitis    CVA (cerebral vascular accident) (HCC)    a.  L parietal CVA by MRI 12/12, likely embolic;  b.  TEE by report with neg bubble study;  c.  carotid dopplers  12/12:  + plaque, no sig ICA stenosis    History of postmenopausal HRT    Hx of adenomatous colonic polyps    Hypothyroidism    MVP (mitral valve prolapse)    a. s/p MV repair at [facility] in 2008;  b. Echocardiogram 7/12: EF 50%, status post mitral valve repair with mild MR and minimal MS, mean gradient 4, moderate LAE, LA diam 55 mm; mild RAE, PASP 28-32;    Osteoarthritis    Osteoporosis    Past Surgical History:  Procedure Laterality Date   ANTERIOR APPROACH HEMI HIP ARTHROPLASTY Right 03/05/2023   Procedure: ANTERIOR APPROACH HEMI HIP ARTHROPLASTY;  Surgeon: Fidel Rogue, MD;  Location: [facility] ORS;  Service: Orthopedics;  Laterality: Right;   CARDIOVERSION  01/09/2012   Procedure: CARDIOVERSION;  Surgeon: Toribio JONELLE Fuel, MD;  Location: [facility] ENDOSCOPY;  Service: Cardiovascular;  Laterality: N/A;   CATARACT EXTRACTION     CHOLECYSTECTOMY     DILATION AND CURETTAGE OF UTERUS     LUMBAR FUSION     MITRAL VALVE REPAIR  03/17/2006   mitral valve repair CE ring and maze procedure   TONSILLECTOMY      Allergies  Allergen Reactions   Tape Other (See Comments)    Skin tears and bruises easily.   Vioxx [Rofecoxib] Other (See Comments)    Elevated LFT's    Outpatient Encounter Medications as of 11/17/2023  Medication Sig   amoxicillin  (AMOXIL ) 500 MG tablet Take 2,000 mg by mouth daily as needed.   apixaban  (ELIQUIS ) 2.5 MG TABS tablet Take 1 tablet (2.5 mg total) by mouth 2 (two) times daily.   Calcium  Citrate-Vitamin D  (CITRACAL PETITES/VITAMIN D ) 200-6.25 MG-MCG TABS Take 1 tablet by mouth 2 (two) times daily.   docusate  sodium (COLACE) 100 MG capsule Take 1 capsule (100 mg total) by mouth 2 (two) times daily.   escitalopram  (LEXAPRO ) 20 MG tablet Take 20 mg by mouth daily.   fluticasone  (FLONASE ) 50 MCG/ACT nasal spray Place 2 sprays into both nostrils daily.   glucosamine-chondroitin 500-400 MG tablet Take 1 tablet by mouth 2 (two) times daily.   Lacosamide  100 MG TABS Take 1 tablet (100 mg total) by mouth in the morning and at bedtime.   lactose free nutrition (BOOST) LIQD Take 237 mLs by mouth 2 (two) times daily between meals.   levothyroxine  (SYNTHROID ) 75 MCG tablet TAKE 1 TABLET BY MOUTH EVERY DAY BEFORE BREAKFAST   loratadine  (CLARITIN ) 10 MG tablet Take 1 tablet (10 mg total) by mouth daily.   metoprolol  tartrate (LOPRESSOR ) 25 MG tablet Take 1 tablet (25 mg total) by mouth 2 (two) times daily.   Multiple Vitamins-Minerals (PRESERVISION AREDS 2 PO) Take 1 capsule by mouth 2 (two) times daily. Reported on 09/04/2015   polyethylene glycol (MIRALAX  / GLYCOLAX ) 17 g packet Take 17 g by mouth daily as needed for mild constipation.   rosuvastatin  (CRESTOR ) 10 MG tablet Take 10 mg by mouth once. 10 mg by mouth in the evening for HLD   Saline (SIMPLY SALINE) 0.9 % AERS Place 2 each into the nose daily as needed.   TYLENOL  8 HOUR ARTHRITIS PAIN 650 MG CR tablet Take 650 mg by mouth daily.   vitamin A 10000 UNIT capsule Take 1 capsule by mouth daily.   zinc  oxide 20 % ointment Apply 1 Application topically See admin instructions. Apply topically to peri/buttocks as needed after each incontinent episode for skin protection.   No facility-administered encounter medications on file as of 11/17/2023.    Review of Systems  Constitutional:  Negative for appetite change, chills, fatigue and fever.  HENT:  Negative for congestion, hearing loss, rhinorrhea and sore throat.   Eyes: Negative.   Respiratory:  Negative for cough, shortness of breath and wheezing.   Cardiovascular:  Negative for chest pain, palpitations and  leg swelling.  Gastrointestinal:  Negative for abdominal pain, constipation, diarrhea, nausea and vomiting.  Genitourinary:  Negative for dysuria.  Musculoskeletal:  Negative for arthralgias, back pain and myalgias.  Skin:  Negative for color change, rash and wound.  Neurological:  Negative for dizziness, weakness and headaches.  Psychiatric/Behavioral:  Negative for behavioral problems. The patient is not nervous/anxious.     Immunization History  Administered Date(s) Administered   Fluad Quad(high Dose 65+) 11/30/2018, 01/17/2020   Fluad Trivalent(High Dose 65+) 03/02/2023   INFLUENZA, HIGH DOSE SEASONAL PF 12/22/2012, 03/06/2015, 11/26/2015, 12/09/2017, 11/17/2022   Influenza Split 12/17/2010, 12/02/2011   Influenza Whole 03/17/2001, 12/18/2006, 12/28/2007, 01/03/2009, 12/26/2009   Influenza,inj,Quad PF,6+ Mos 11/23/2013   Influenza-Unspecified 11/20/2016   PFIZER(Purple Top)SARS-COV-2 Vaccination 06/09/2019, 06/30/2019, 02/16/2020   Pneumococcal Conjugate-13 03/02/2014   Pneumococcal Polysaccharide-23 03/17/2000, 09/16/2006   Td 03/18/1995, 09/15/2006,  02/22/2021   Tdap 12/17/2010   Unspecified SARS-COV-2 Vaccination 12/08/2020   Zoster Recombinant(Shingrix) 09/18/2016, 11/20/2016   Pertinent  Health Maintenance Due  Topic Date Due   INFLUENZA VACCINE  10/16/2023   DEXA SCAN  Completed      11/25/2021   10:46 AM 02/11/2022   11:26 AM 05/26/2022   11:09 AM 03/02/2023    3:17 PM 05/18/2023   11:29 AM  Fall Risk  Falls in the past year? 0 1 1 1 1   Was there an injury with Fall? 0 1 1 0 1  Was there an injury with Fall? - Comments  fractured rib     Fall Risk Category Calculator 0 3 3 2 2   Fall Risk Category (Retired) Low  High      (RETIRED) Patient Fall Risk Level Low fall risk  Low fall risk      Patient at Risk for Falls Due to No Fall Risks No Fall Risks History of fall(s) Impaired balance/gait;Impaired mobility;History of fall(s) History of fall(s);Impaired  balance/gait;Impaired mobility  Fall risk Follow up Falls evaluation completed  Falls evaluation completed  Falls evaluation completed Falls prevention discussed Falls evaluation completed;Education provided     Data saved with a previous flowsheet row definition   Functional Status Survey:    Vitals:   11/17/23 1430 11/17/23 1431  BP: (!) 151/70 136/81  Pulse: 67   Temp: (!) 97.3 F (36.3 C)   SpO2: 98%   Weight: 108 lb 9.6 oz (49.3 kg)   Height: 5' (1.524 m)    Body mass index is 21.21 kg/m. Physical Exam Constitutional:      General: She is not in acute distress.    Appearance: Normal appearance.  HENT:     Head: Normocephalic and atraumatic.     Nose: Nose normal.     Mouth/Throat:     Mouth: Mucous membranes are moist.  Eyes:     Conjunctiva/sclera: Conjunctivae normal.  Cardiovascular:     Rate and Rhythm: Normal rate. Rhythm irregular.  Pulmonary:     Effort: Pulmonary effort is normal.     Breath sounds: Normal breath sounds.  Abdominal:     General: Bowel sounds are normal.     Palpations: Abdomen is soft.  Musculoskeletal:        General: Normal range of motion.     Cervical back: Normal range of motion.  Skin:    General: Skin is warm and dry.  Neurological:     General: No focal deficit present.     Mental Status: She is alert and oriented to person, place, and time.  Psychiatric:        Mood and Affect: Mood normal.        Behavior: Behavior normal.        Thought Content: Thought content normal.        Judgment: Judgment normal.     Labs reviewed: Recent Labs    03/06/23 0337 03/07/23 0352 03/08/23 0352 04/06/23 1326 04/08/23 0438 04/08/23 0439 05/14/23 0948 05/14/23 0954 07/28/23 0847  NA 130* 133* 139   < > 140  --  139 139 135  K 4.0 4.6 4.4   < > 3.6  --  4.3 4.3 4.2  CL 97* 98 103   < > 105  --  102 103 104  CO2 26 29 30    < > 23  --  26  --  25  GLUCOSE 154* 131* 95   < > 89  --  110* 103* 93  BUN 25* 34* 27*   < > 24*  --   21 22 24*  CREATININE 0.93 0.89 0.64   < > 0.89  --  0.93 1.00 0.95  CALCIUM  7.7* 8.5* 8.6*   < > 8.6*  --  9.3  --  8.5*  MG  --  2.1 2.2  --   --  2.0  --   --   --   PHOS 3.1 1.6* 3.1  --   --   --   --   --   --    < > = values in this interval not displayed.   Recent Labs    03/03/23 2200 03/05/23 0748 03/08/23 0352 04/06/23 1326 05/14/23 0948  AST 29  --   --  33 29  ALT 42  --   --  28 25  ALKPHOS 81  --   --  159* 127*  BILITOT 0.7  --   --  0.7 0.9  PROT 5.9*  --   --  6.2* 6.5  ALBUMIN 3.6   < > 2.8* 3.6 3.8   < > = values in this interval not displayed.   Recent Labs    03/03/23 2124 03/05/23 0748 04/06/23 1326 05/14/23 0948 05/14/23 0954 07/28/23 0847  WBC 10.9*   < > 10.5 9.3  --  6.9  NEUTROABS 9.0*  --  8.0* 7.2  --   --   HGB 13.4   < > 11.9* 12.7 12.9 12.1  HCT 41.0   < > 37.9 39.8 38.0 38.4  MCV 102.2*   < > 101.6* 97.8  --  94.6  PLT 150   < > 191 173  --  173   < > = values in this interval not displayed.   Lab Results  Component Value Date   TSH 1.862 04/08/2023   Lab Results  Component Value Date   HGBA1C 5.5 03/06/2023   Lab Results  Component Value Date   CHOL 132 04/07/2023   HDL 66 04/07/2023   LDLCALC 57 04/07/2023   LDLDIRECT 47.0 09/04/2015   TRIG 43 04/07/2023   CHOLHDL 2.0 04/07/2023    Significant Diagnostic Results in last 30 days:  No results found.  Assessment/Plan  1. Paroxysmal atrial fibrillation (HCC) (Primary) -  fell today and hit her head -  currently taking Eliquis  2.5 mg BID and Metoprolol  tartrate 25 mg BID -  declined going to ED for evaluation.   2. Hypothyroidism due to acquired atrophy of thyroid  Lab Results  Component Value Date   TSH 1.862 04/08/2023    -  continue Levothyroxine  75 mcg daily   Family/ staff Communication:   Discussed plan of care with resident and charge nurse.  Labs/tests ordered:  None

## 2023-11-18 DIAGNOSIS — S7291XA Unspecified fracture of right femur, initial encounter for closed fracture: Secondary | ICD-10-CM | POA: Diagnosis not present

## 2023-11-18 DIAGNOSIS — R1312 Dysphagia, oropharyngeal phase: Secondary | ICD-10-CM | POA: Diagnosis not present

## 2023-11-18 DIAGNOSIS — Z4789 Encounter for other orthopedic aftercare: Secondary | ICD-10-CM | POA: Diagnosis not present

## 2023-11-18 DIAGNOSIS — R269 Unspecified abnormalities of gait and mobility: Secondary | ICD-10-CM | POA: Diagnosis not present

## 2023-11-18 DIAGNOSIS — M6281 Muscle weakness (generalized): Secondary | ICD-10-CM | POA: Diagnosis not present

## 2023-11-18 DIAGNOSIS — R41841 Cognitive communication deficit: Secondary | ICD-10-CM | POA: Diagnosis not present

## 2023-11-19 ENCOUNTER — Encounter: Payer: Self-pay | Admitting: Orthopedic Surgery

## 2023-11-19 ENCOUNTER — Non-Acute Institutional Stay: Payer: Self-pay | Admitting: Orthopedic Surgery

## 2023-11-19 DIAGNOSIS — G3184 Mild cognitive impairment, so stated: Secondary | ICD-10-CM

## 2023-11-19 DIAGNOSIS — G40909 Epilepsy, unspecified, not intractable, without status epilepticus: Secondary | ICD-10-CM | POA: Diagnosis not present

## 2023-11-19 DIAGNOSIS — Z9181 History of falling: Secondary | ICD-10-CM | POA: Diagnosis not present

## 2023-11-19 DIAGNOSIS — Z4789 Encounter for other orthopedic aftercare: Secondary | ICD-10-CM | POA: Diagnosis not present

## 2023-11-19 DIAGNOSIS — S7291XA Unspecified fracture of right femur, initial encounter for closed fracture: Secondary | ICD-10-CM | POA: Diagnosis not present

## 2023-11-19 DIAGNOSIS — R41841 Cognitive communication deficit: Secondary | ICD-10-CM | POA: Diagnosis not present

## 2023-11-19 DIAGNOSIS — R1312 Dysphagia, oropharyngeal phase: Secondary | ICD-10-CM | POA: Diagnosis not present

## 2023-11-19 DIAGNOSIS — R296 Repeated falls: Secondary | ICD-10-CM | POA: Diagnosis not present

## 2023-11-19 DIAGNOSIS — I480 Paroxysmal atrial fibrillation: Secondary | ICD-10-CM | POA: Diagnosis not present

## 2023-11-19 DIAGNOSIS — I639 Cerebral infarction, unspecified: Secondary | ICD-10-CM | POA: Diagnosis not present

## 2023-11-19 DIAGNOSIS — M6281 Muscle weakness (generalized): Secondary | ICD-10-CM | POA: Diagnosis not present

## 2023-11-19 DIAGNOSIS — F339 Major depressive disorder, recurrent, unspecified: Secondary | ICD-10-CM

## 2023-11-19 DIAGNOSIS — E034 Atrophy of thyroid (acquired): Secondary | ICD-10-CM

## 2023-11-19 NOTE — Progress Notes (Addendum)
 Location:  [facility] Room Number: AL28-A Place of Service:  ALF [phone]) Provider:  Gil Greig BRAVO, NP  Patient Care Team: Charlanne Fredia CROME, MD as PCP - General (Internal Medicine) Delford Maude BROCKS, MD as PCP - Cardiology (Cardiology) Rosan Credit, MD as Consulting Physician (Ophthalmology) Sharalyn, Janina GRADE, MD as Referring Physician (Ophthalmology) Buck Saucer, MD as Consulting Physician (Neurology) Charlanne Fredia CROME, MD as Consulting Physician (Internal Medicine)  Extended Emergency Contact Information Primary Emergency Contact: Rudi JANEANE Lesches Work Phone: [phone] Mobile Phone: [phone] Relation: Other Secondary Emergency Contact: Heyge,Lorna Mobile Phone: [phone] Relation: Friend Interpreter needed? No  Code Status:  DNR Goals of care: Advanced Directive information    08/19/2023   12:08 PM  Advanced Directives  Does Patient Have a Medical Advance Directive? Yes  Type of Estate agent of [place];Living will  Does patient want to make changes to medical advance directive? No - Patient declined  Copy of Healthcare Power of Attorney in Chart? Yes - validated most recent copy scanned in chart (See row information)  Would patient like information on creating a medical advance directive? No - Patient declined     Chief Complaint  Patient presents with   Medical Management of Chronic Issues    HPI:  Pt is a 89 y.o. female seen today for medical management of chronic diseases.    She currently resides on the assisted living unit at [facility]. PMH: left MCA stroke 2015, TIA, atrial fib, MVR, HTN, HLD, Raynaud's, hypothyroidism, right hip fracture s/p hemiarthroplasty 03/05/2023, macular degeneration, GERD, CKD, and depression.    Recurrent strokes- left MCA stroke 2015, 01/20 embolic stroke involving left MCA/PCA, right MCA/ACA punctate (symptoms slurred speech and confusion), last stoke thought to be  related to PAF, remains on rosuvastatin  and Eliquis > BP goal < 130/90, LDL goal < 70, A1c< 5.5 Frequent falls- falls reported 09/02, 08/23, 08/08, 07/26, 07/09, forgets to use walker in room, 06/30 diagnosed with right retinal vein occlusion Cognitive impairment- MMSE 27/30 05/2023, CT head noted chronic small vessel disease, no behaviors, able to perform ADLs in AL, not on medication Seizure disorder- 02/27 ED evaluation due to aphasia and left sided weakness, CT head no acute intracranial abnormality, EEG negative, followed by neurology, no recent seizures, remains on Vimpat  PAF- HR controlled with metoprolol , remains on Eliquis  for clot prevention Hypothyroidism- TSH 1.862 04/08/2023, remains on levothyroxine  Depression- Na+ 135 07/28/2023, remains on Lexapro   Recent blood pressures:  09/03- 138/88  08/27- 151/70  08/20- 136/81  Recent weights:  09/01- 109.4 lbs  08/04- 108.6 lbs  07/01- 111 lbs      Past Medical History:  Diagnosis Date   Atrial fibrillation (HCC)    AFIB   Collagenous colitis    CVA (cerebral vascular accident) (HCC)    a.  L parietal CVA by MRI 12/12, likely embolic;  b.  TEE by report with neg bubble study;  c.  carotid dopplers  12/12:  + plaque, no sig ICA stenosis    History of postmenopausal HRT    Hx of adenomatous colonic polyps    Hypothyroidism    MVP (mitral valve prolapse)    a. s/p MV repair at [facility] in 2008;  b. Echocardiogram 7/12: EF 50%, status post mitral valve repair with mild MR and minimal MS, mean gradient 4, moderate LAE, LA diam 55 mm; mild RAE, PASP 28-32;    Osteoarthritis    Osteoporosis    Past Surgical History:  Procedure  Laterality Date   ANTERIOR APPROACH HEMI HIP ARTHROPLASTY Right 03/05/2023   Procedure: ANTERIOR APPROACH HEMI HIP ARTHROPLASTY;  Surgeon: Fidel Rogue, MD;  Location: [facility] ORS;  Service: Orthopedics;  Laterality: Right;   CARDIOVERSION  01/09/2012   Procedure: CARDIOVERSION;  Surgeon: Toribio JONELLE Fuel, MD;   Location: [facility] ENDOSCOPY;  Service: Cardiovascular;  Laterality: N/A;   CATARACT EXTRACTION     CHOLECYSTECTOMY     DILATION AND CURETTAGE OF UTERUS     LUMBAR FUSION     MITRAL VALVE REPAIR  03/17/2006   mitral valve repair CE ring and maze procedure   TONSILLECTOMY      Allergies  Allergen Reactions   Tape Other (See Comments)    Skin tears and bruises easily.   Vioxx [Rofecoxib] Other (See Comments)    Elevated LFT's    Outpatient Encounter Medications as of 11/19/2023  Medication Sig   amoxicillin  (AMOXIL ) 500 MG tablet Take 2,000 mg by mouth daily as needed.   apixaban  (ELIQUIS ) 2.5 MG TABS tablet Take 1 tablet (2.5 mg total) by mouth 2 (two) times daily.   Calcium  Citrate-Vitamin D  (CITRACAL PETITES/VITAMIN D ) 200-6.25 MG-MCG TABS Take 1 tablet by mouth 2 (two) times daily.   docusate sodium  (COLACE) 100 MG capsule Take 1 capsule (100 mg total) by mouth 2 (two) times daily.   escitalopram  (LEXAPRO ) 20 MG tablet Take 20 mg by mouth daily.   fluticasone  (FLONASE ) 50 MCG/ACT nasal spray Place 2 sprays into both nostrils daily.   glucosamine-chondroitin 500-400 MG tablet Take 1 tablet by mouth 2 (two) times daily.   Lacosamide  100 MG TABS Take 1 tablet (100 mg total) by mouth in the morning and at bedtime.   lactose free nutrition (BOOST) LIQD Take 237 mLs by mouth daily.   levothyroxine  (SYNTHROID ) 75 MCG tablet TAKE 1 TABLET BY MOUTH EVERY DAY BEFORE BREAKFAST   loratadine  (CLARITIN ) 10 MG tablet Take 1 tablet (10 mg total) by mouth daily.   metoprolol  tartrate (LOPRESSOR ) 25 MG tablet Take 1 tablet (25 mg total) by mouth 2 (two) times daily.   Multiple Vitamins-Minerals (PRESERVISION AREDS 2 PO) Take 1 capsule by mouth 2 (two) times daily. Reported on 09/04/2015   polyethylene glycol (MIRALAX  / GLYCOLAX ) 17 g packet Take 17 g by mouth daily as needed for mild constipation.   rosuvastatin  (CRESTOR ) 10 MG tablet Take 10 mg by mouth once. 10 mg by mouth in the evening for HLD    Saline (SIMPLY SALINE) 0.9 % AERS Place 2 each into the nose daily as needed.   TYLENOL  8 HOUR ARTHRITIS PAIN 650 MG CR tablet Take 650 mg by mouth daily.   vitamin A 10000 UNIT capsule Take 1 capsule by mouth daily.   zinc  oxide 20 % ointment Apply 1 Application topically See admin instructions. Apply topically to peri/buttocks as needed after each incontinent episode for skin protection.   No facility-administered encounter medications on file as of 11/19/2023.    Review of Systems  Constitutional: Negative.   HENT: Negative.    Eyes:  Positive for visual disturbance.  Respiratory:  Negative for cough and shortness of breath.   Cardiovascular:  Negative for chest pain and leg swelling.  Gastrointestinal:  Negative for abdominal distention and abdominal pain.  Genitourinary:  Negative for dysuria, frequency and hematuria.  Musculoskeletal:  Positive for gait problem. Negative for arthralgias.  Skin:  Negative for wound.  Neurological:  Positive for seizures. Negative for dizziness and headaches.  Psychiatric/Behavioral:  Positive for confusion.  Negative for behavioral problems, dysphoric mood and sleep disturbance. The patient is not nervous/anxious.     Immunization History  Administered Date(s) Administered   Fluad Quad(high Dose 65+) 11/30/2018, 01/17/2020   Fluad Trivalent(High Dose 65+) 03/02/2023   INFLUENZA, HIGH DOSE SEASONAL PF 12/22/2012, 03/06/2015, 11/26/2015, 12/09/2017, 11/17/2022   Influenza Split 12/17/2010, 12/02/2011   Influenza Whole 03/17/2001, 12/18/2006, 12/28/2007, 01/03/2009, 12/26/2009   Influenza,inj,Quad PF,6+ Mos 11/23/2013   Influenza-Unspecified 11/20/2016   PFIZER(Purple Top)SARS-COV-2 Vaccination 06/09/2019, 06/30/2019, 02/16/2020   Pneumococcal Conjugate-13 03/02/2014   Pneumococcal Polysaccharide-23 03/17/2000, 09/16/2006   Td 03/18/1995, 09/15/2006, 02/22/2021   Tdap 12/17/2010   Unspecified SARS-COV-2 Vaccination 12/08/2020   Zoster  Recombinant(Shingrix) 09/18/2016, 11/20/2016   Pertinent  Health Maintenance Due  Topic Date Due   INFLUENZA VACCINE  10/16/2023   DEXA SCAN  Completed      11/25/2021   10:46 AM 02/11/2022   11:26 AM 05/26/2022   11:09 AM 03/02/2023    3:17 PM 05/18/2023   11:29 AM  Fall Risk  Falls in the past year? 0 1 1 1 1   Was there an injury with Fall? 0 1 1 0 1  Was there an injury with Fall? - Comments  fractured rib     Fall Risk Category Calculator 0 3 3 2 2   Fall Risk Category (Retired) Low  High      (RETIRED) Patient Fall Risk Level Low fall risk  Low fall risk      Patient at Risk for Falls Due to No Fall Risks No Fall Risks History of fall(s) Impaired balance/gait;Impaired mobility;History of fall(s) History of fall(s);Impaired balance/gait;Impaired mobility  Fall risk Follow up Falls evaluation completed  Falls evaluation completed  Falls evaluation completed Falls prevention discussed Falls evaluation completed;Education provided     Data saved with a previous flowsheet row definition   Functional Status Survey:    Vitals:   11/19/23 1404  BP: 138/88  Pulse: 99  Temp: 98.3 F (36.8 C)  SpO2: 96%  Weight: 108 lb 9.6 oz (49.3 kg)  Height: 5' (1.524 m)   Body mass index is 21.21 kg/m. Physical Exam Vitals reviewed.  Constitutional:      General: She is not in acute distress. HENT:     Head: Normocephalic.     Right Ear: There is no impacted cerumen.     Left Ear: There is no impacted cerumen.     Nose: Nose normal.     Mouth/Throat:     Mouth: Mucous membranes are moist.  Eyes:     General:        Right eye: No discharge.        Left eye: No discharge.  Cardiovascular:     Rate and Rhythm: Normal rate and regular rhythm.     Pulses: Normal pulses.     Heart sounds: Normal heart sounds.  Pulmonary:     Effort: Pulmonary effort is normal. No respiratory distress.     Breath sounds: Normal breath sounds. No wheezing or rales.  Abdominal:     General: Bowel  sounds are normal.     Palpations: Abdomen is soft.  Musculoskeletal:     Cervical back: Neck supple.     Right lower leg: No edema.     Left lower leg: No edema.  Skin:    General: Skin is warm.     Capillary Refill: Capillary refill takes less than 2 seconds.  Neurological:     General: No focal deficit present.  Mental Status: She is alert. Mental status is at baseline.     Motor: Weakness present.     Gait: Gait abnormal.  Psychiatric:        Mood and Affect: Mood normal.     Labs reviewed: Recent Labs    03/06/23 0337 03/07/23 0352 03/08/23 0352 04/06/23 1326 04/08/23 0438 04/08/23 0439 05/14/23 0948 05/14/23 0954 07/28/23 0847  NA 130* 133* 139   < > 140  --  139 139 135  K 4.0 4.6 4.4   < > 3.6  --  4.3 4.3 4.2  CL 97* 98 103   < > 105  --  102 103 104  CO2 26 29 30    < > 23  --  26  --  25  GLUCOSE 154* 131* 95   < > 89  --  110* 103* 93  BUN 25* 34* 27*   < > 24*  --  21 22 24*  CREATININE 0.93 0.89 0.64   < > 0.89  --  0.93 1.00 0.95  CALCIUM  7.7* 8.5* 8.6*   < > 8.6*  --  9.3  --  8.5*  MG  --  2.1 2.2  --   --  2.0  --   --   --   PHOS 3.1 1.6* 3.1  --   --   --   --   --   --    < > = values in this interval not displayed.   Recent Labs    03/03/23 2200 03/05/23 0748 03/08/23 0352 04/06/23 1326 05/14/23 0948  AST 29  --   --  33 29  ALT 42  --   --  28 25  ALKPHOS 81  --   --  159* 127*  BILITOT 0.7  --   --  0.7 0.9  PROT 5.9*  --   --  6.2* 6.5  ALBUMIN 3.6   < > 2.8* 3.6 3.8   < > = values in this interval not displayed.   Recent Labs    03/03/23 2124 03/05/23 0748 04/06/23 1326 05/14/23 0948 05/14/23 0954 07/28/23 0847  WBC 10.9*   < > 10.5 9.3  --  6.9  NEUTROABS 9.0*  --  8.0* 7.2  --   --   HGB 13.4   < > 11.9* 12.7 12.9 12.1  HCT 41.0   < > 37.9 39.8 38.0 38.4  MCV 102.2*   < > 101.6* 97.8  --  94.6  PLT 150   < > 191 173  --  173   < > = values in this interval not displayed.   Lab Results  Component Value Date   TSH  1.862 04/08/2023   Lab Results  Component Value Date   HGBA1C 5.5 03/06/2023   Lab Results  Component Value Date   CHOL 132 04/07/2023   HDL 66 04/07/2023   LDLCALC 57 04/07/2023   LDLDIRECT 47.0 09/04/2015   TRIG 43 04/07/2023   CHOLHDL 2.0 04/07/2023    Significant Diagnostic Results in last 30 days:  No results found.  Assessment/Plan 1. Recurrent strokes (HCC) (Primary) - followed by neurology - no new episodes since starting Eliquis   2. Frequent falls - 5 falls in past 5 weeks - forgets to use walker, 08/2023 diagnosed with retinal occlusion - followed by OT  3. Seizure disorder (HCC) - no recent episodes - followed by neurology - cont Vimpat   4. Paroxysmal atrial fibrillation (HCC) - HR<  100 with metoprolol  - cont Eliquis  for clot prevention  5. Hypothyroidism due to acquired atrophy of thyroid  - TSH stable - cont levothyroxine   6. Recurrent depression (HCC) - no mood changes - cont Lexapro     Family/ staff Communication: plan discussed with patient and nurse  Labs/tests ordered:  cbc/diff, cmp, lipid panel, TSH, A1c 09/08

## 2023-11-20 DIAGNOSIS — S7291XA Unspecified fracture of right femur, initial encounter for closed fracture: Secondary | ICD-10-CM | POA: Diagnosis not present

## 2023-11-20 DIAGNOSIS — Z4789 Encounter for other orthopedic aftercare: Secondary | ICD-10-CM | POA: Diagnosis not present

## 2023-11-20 DIAGNOSIS — M6281 Muscle weakness (generalized): Secondary | ICD-10-CM | POA: Diagnosis not present

## 2023-11-20 DIAGNOSIS — R41841 Cognitive communication deficit: Secondary | ICD-10-CM | POA: Diagnosis not present

## 2023-11-20 DIAGNOSIS — R269 Unspecified abnormalities of gait and mobility: Secondary | ICD-10-CM | POA: Diagnosis not present

## 2023-11-20 DIAGNOSIS — R1312 Dysphagia, oropharyngeal phase: Secondary | ICD-10-CM | POA: Diagnosis not present

## 2023-11-23 DIAGNOSIS — E782 Mixed hyperlipidemia: Secondary | ICD-10-CM | POA: Diagnosis not present

## 2023-11-23 DIAGNOSIS — R269 Unspecified abnormalities of gait and mobility: Secondary | ICD-10-CM | POA: Diagnosis not present

## 2023-11-23 DIAGNOSIS — G4089 Other seizures: Secondary | ICD-10-CM | POA: Diagnosis not present

## 2023-11-23 DIAGNOSIS — Z4789 Encounter for other orthopedic aftercare: Secondary | ICD-10-CM | POA: Diagnosis not present

## 2023-11-23 DIAGNOSIS — M6281 Muscle weakness (generalized): Secondary | ICD-10-CM | POA: Diagnosis not present

## 2023-11-24 DIAGNOSIS — R1312 Dysphagia, oropharyngeal phase: Secondary | ICD-10-CM | POA: Diagnosis not present

## 2023-11-24 DIAGNOSIS — H34831 Tributary (branch) retinal vein occlusion, right eye, with macular edema: Secondary | ICD-10-CM | POA: Diagnosis not present

## 2023-11-24 DIAGNOSIS — Z4789 Encounter for other orthopedic aftercare: Secondary | ICD-10-CM | POA: Diagnosis not present

## 2023-11-24 DIAGNOSIS — Z9181 History of falling: Secondary | ICD-10-CM | POA: Diagnosis not present

## 2023-11-24 DIAGNOSIS — S7291XA Unspecified fracture of right femur, initial encounter for closed fracture: Secondary | ICD-10-CM | POA: Diagnosis not present

## 2023-11-24 DIAGNOSIS — M6281 Muscle weakness (generalized): Secondary | ICD-10-CM | POA: Diagnosis not present

## 2023-11-24 DIAGNOSIS — L03213 Periorbital cellulitis: Secondary | ICD-10-CM | POA: Diagnosis not present

## 2023-11-24 DIAGNOSIS — R41841 Cognitive communication deficit: Secondary | ICD-10-CM | POA: Diagnosis not present

## 2023-11-25 DIAGNOSIS — Z4789 Encounter for other orthopedic aftercare: Secondary | ICD-10-CM | POA: Diagnosis not present

## 2023-11-25 DIAGNOSIS — R269 Unspecified abnormalities of gait and mobility: Secondary | ICD-10-CM | POA: Diagnosis not present

## 2023-11-25 DIAGNOSIS — M6281 Muscle weakness (generalized): Secondary | ICD-10-CM | POA: Diagnosis not present

## 2023-11-26 DIAGNOSIS — Z9181 History of falling: Secondary | ICD-10-CM | POA: Diagnosis not present

## 2023-11-26 DIAGNOSIS — Z4789 Encounter for other orthopedic aftercare: Secondary | ICD-10-CM | POA: Diagnosis not present

## 2023-11-26 DIAGNOSIS — R41841 Cognitive communication deficit: Secondary | ICD-10-CM | POA: Diagnosis not present

## 2023-11-26 DIAGNOSIS — R1312 Dysphagia, oropharyngeal phase: Secondary | ICD-10-CM | POA: Diagnosis not present

## 2023-11-26 DIAGNOSIS — S7291XA Unspecified fracture of right femur, initial encounter for closed fracture: Secondary | ICD-10-CM | POA: Diagnosis not present

## 2023-11-26 DIAGNOSIS — M6281 Muscle weakness (generalized): Secondary | ICD-10-CM | POA: Diagnosis not present

## 2023-11-27 DIAGNOSIS — R269 Unspecified abnormalities of gait and mobility: Secondary | ICD-10-CM | POA: Diagnosis not present

## 2023-11-27 DIAGNOSIS — M6281 Muscle weakness (generalized): Secondary | ICD-10-CM | POA: Diagnosis not present

## 2023-11-27 DIAGNOSIS — Z4789 Encounter for other orthopedic aftercare: Secondary | ICD-10-CM | POA: Diagnosis not present

## 2023-11-30 DIAGNOSIS — R1312 Dysphagia, oropharyngeal phase: Secondary | ICD-10-CM | POA: Diagnosis not present

## 2023-11-30 DIAGNOSIS — S7291XA Unspecified fracture of right femur, initial encounter for closed fracture: Secondary | ICD-10-CM | POA: Diagnosis not present

## 2023-11-30 DIAGNOSIS — R269 Unspecified abnormalities of gait and mobility: Secondary | ICD-10-CM | POA: Diagnosis not present

## 2023-11-30 DIAGNOSIS — Z4789 Encounter for other orthopedic aftercare: Secondary | ICD-10-CM | POA: Diagnosis not present

## 2023-12-01 ENCOUNTER — Other Ambulatory Visit: Payer: Self-pay | Admitting: Adult Health

## 2023-12-01 DIAGNOSIS — S7291XA Unspecified fracture of right femur, initial encounter for closed fracture: Secondary | ICD-10-CM | POA: Diagnosis not present

## 2023-12-01 DIAGNOSIS — Z9181 History of falling: Secondary | ICD-10-CM | POA: Diagnosis not present

## 2023-12-01 DIAGNOSIS — R569 Unspecified convulsions: Secondary | ICD-10-CM

## 2023-12-01 DIAGNOSIS — R1312 Dysphagia, oropharyngeal phase: Secondary | ICD-10-CM | POA: Diagnosis not present

## 2023-12-01 DIAGNOSIS — R41841 Cognitive communication deficit: Secondary | ICD-10-CM | POA: Diagnosis not present

## 2023-12-01 DIAGNOSIS — M6281 Muscle weakness (generalized): Secondary | ICD-10-CM | POA: Diagnosis not present

## 2023-12-01 DIAGNOSIS — Z4789 Encounter for other orthopedic aftercare: Secondary | ICD-10-CM | POA: Diagnosis not present

## 2023-12-01 MED ORDER — LACOSAMIDE 100 MG PO TABS: 1.0000 | ORAL_TABLET | Freq: Two times a day (BID) | ORAL | 0 refills | Status: DC

## 2023-12-02 DIAGNOSIS — R1312 Dysphagia, oropharyngeal phase: Secondary | ICD-10-CM | POA: Diagnosis not present

## 2023-12-03 DIAGNOSIS — Z4789 Encounter for other orthopedic aftercare: Secondary | ICD-10-CM | POA: Diagnosis not present

## 2023-12-03 DIAGNOSIS — Z9181 History of falling: Secondary | ICD-10-CM | POA: Diagnosis not present

## 2023-12-03 DIAGNOSIS — M6281 Muscle weakness (generalized): Secondary | ICD-10-CM | POA: Diagnosis not present

## 2023-12-04 DIAGNOSIS — Z4789 Encounter for other orthopedic aftercare: Secondary | ICD-10-CM | POA: Diagnosis not present

## 2023-12-04 DIAGNOSIS — S7291XA Unspecified fracture of right femur, initial encounter for closed fracture: Secondary | ICD-10-CM | POA: Diagnosis not present

## 2023-12-04 DIAGNOSIS — R269 Unspecified abnormalities of gait and mobility: Secondary | ICD-10-CM | POA: Diagnosis not present

## 2023-12-04 DIAGNOSIS — R41841 Cognitive communication deficit: Secondary | ICD-10-CM | POA: Diagnosis not present

## 2023-12-04 DIAGNOSIS — M6281 Muscle weakness (generalized): Secondary | ICD-10-CM | POA: Diagnosis not present

## 2023-12-04 DIAGNOSIS — R1312 Dysphagia, oropharyngeal phase: Secondary | ICD-10-CM | POA: Diagnosis not present

## 2023-12-07 ENCOUNTER — Ambulatory Visit: Admitting: Neurology

## 2023-12-07 ENCOUNTER — Telehealth: Payer: Self-pay | Admitting: Neurology

## 2023-12-07 DIAGNOSIS — R41841 Cognitive communication deficit: Secondary | ICD-10-CM | POA: Diagnosis not present

## 2023-12-07 DIAGNOSIS — S7291XA Unspecified fracture of right femur, initial encounter for closed fracture: Secondary | ICD-10-CM | POA: Diagnosis not present

## 2023-12-07 DIAGNOSIS — R269 Unspecified abnormalities of gait and mobility: Secondary | ICD-10-CM | POA: Diagnosis not present

## 2023-12-07 DIAGNOSIS — R1312 Dysphagia, oropharyngeal phase: Secondary | ICD-10-CM | POA: Diagnosis not present

## 2023-12-07 DIAGNOSIS — M6281 Muscle weakness (generalized): Secondary | ICD-10-CM | POA: Diagnosis not present

## 2023-12-07 DIAGNOSIS — Z4789 Encounter for other orthopedic aftercare: Secondary | ICD-10-CM | POA: Diagnosis not present

## 2023-12-07 NOTE — Telephone Encounter (Signed)
 Pt facility called to informed that PT will not be able to make appt today due to being sick  Appt Rescheduled

## 2023-12-08 DIAGNOSIS — R41841 Cognitive communication deficit: Secondary | ICD-10-CM | POA: Diagnosis not present

## 2023-12-08 DIAGNOSIS — M6281 Muscle weakness (generalized): Secondary | ICD-10-CM | POA: Diagnosis not present

## 2023-12-08 DIAGNOSIS — Z4789 Encounter for other orthopedic aftercare: Secondary | ICD-10-CM | POA: Diagnosis not present

## 2023-12-08 DIAGNOSIS — Z9181 History of falling: Secondary | ICD-10-CM | POA: Diagnosis not present

## 2023-12-08 DIAGNOSIS — S7291XA Unspecified fracture of right femur, initial encounter for closed fracture: Secondary | ICD-10-CM | POA: Diagnosis not present

## 2023-12-08 DIAGNOSIS — R1312 Dysphagia, oropharyngeal phase: Secondary | ICD-10-CM | POA: Diagnosis not present

## 2023-12-10 DIAGNOSIS — M6281 Muscle weakness (generalized): Secondary | ICD-10-CM | POA: Diagnosis not present

## 2023-12-10 DIAGNOSIS — Z4789 Encounter for other orthopedic aftercare: Secondary | ICD-10-CM | POA: Diagnosis not present

## 2023-12-10 DIAGNOSIS — R1312 Dysphagia, oropharyngeal phase: Secondary | ICD-10-CM | POA: Diagnosis not present

## 2023-12-10 DIAGNOSIS — S7291XA Unspecified fracture of right femur, initial encounter for closed fracture: Secondary | ICD-10-CM | POA: Diagnosis not present

## 2023-12-10 DIAGNOSIS — R41841 Cognitive communication deficit: Secondary | ICD-10-CM | POA: Diagnosis not present

## 2023-12-10 DIAGNOSIS — R269 Unspecified abnormalities of gait and mobility: Secondary | ICD-10-CM | POA: Diagnosis not present

## 2023-12-11 DIAGNOSIS — R41841 Cognitive communication deficit: Secondary | ICD-10-CM | POA: Diagnosis not present

## 2023-12-11 DIAGNOSIS — Z4789 Encounter for other orthopedic aftercare: Secondary | ICD-10-CM | POA: Diagnosis not present

## 2023-12-11 DIAGNOSIS — R269 Unspecified abnormalities of gait and mobility: Secondary | ICD-10-CM | POA: Diagnosis not present

## 2023-12-11 DIAGNOSIS — R1312 Dysphagia, oropharyngeal phase: Secondary | ICD-10-CM | POA: Diagnosis not present

## 2023-12-11 DIAGNOSIS — S7291XA Unspecified fracture of right femur, initial encounter for closed fracture: Secondary | ICD-10-CM | POA: Diagnosis not present

## 2023-12-11 DIAGNOSIS — Z9181 History of falling: Secondary | ICD-10-CM | POA: Diagnosis not present

## 2023-12-11 DIAGNOSIS — M6281 Muscle weakness (generalized): Secondary | ICD-10-CM | POA: Diagnosis not present

## 2023-12-14 DIAGNOSIS — R269 Unspecified abnormalities of gait and mobility: Secondary | ICD-10-CM | POA: Diagnosis not present

## 2023-12-14 DIAGNOSIS — M6281 Muscle weakness (generalized): Secondary | ICD-10-CM | POA: Diagnosis not present

## 2023-12-14 DIAGNOSIS — Z4789 Encounter for other orthopedic aftercare: Secondary | ICD-10-CM | POA: Diagnosis not present

## 2023-12-14 DIAGNOSIS — R41841 Cognitive communication deficit: Secondary | ICD-10-CM | POA: Diagnosis not present

## 2023-12-14 DIAGNOSIS — S7291XA Unspecified fracture of right femur, initial encounter for closed fracture: Secondary | ICD-10-CM | POA: Diagnosis not present

## 2023-12-14 DIAGNOSIS — R1312 Dysphagia, oropharyngeal phase: Secondary | ICD-10-CM | POA: Diagnosis not present

## 2023-12-15 DIAGNOSIS — S7291XA Unspecified fracture of right femur, initial encounter for closed fracture: Secondary | ICD-10-CM | POA: Diagnosis not present

## 2023-12-15 DIAGNOSIS — R41841 Cognitive communication deficit: Secondary | ICD-10-CM | POA: Diagnosis not present

## 2023-12-15 DIAGNOSIS — R1312 Dysphagia, oropharyngeal phase: Secondary | ICD-10-CM | POA: Diagnosis not present

## 2023-12-16 DIAGNOSIS — R41841 Cognitive communication deficit: Secondary | ICD-10-CM | POA: Diagnosis not present

## 2023-12-16 DIAGNOSIS — H34831 Tributary (branch) retinal vein occlusion, right eye, with macular edema: Secondary | ICD-10-CM | POA: Diagnosis not present

## 2023-12-16 DIAGNOSIS — R269 Unspecified abnormalities of gait and mobility: Secondary | ICD-10-CM | POA: Diagnosis not present

## 2023-12-16 DIAGNOSIS — M6281 Muscle weakness (generalized): Secondary | ICD-10-CM | POA: Diagnosis not present

## 2023-12-16 DIAGNOSIS — R1312 Dysphagia, oropharyngeal phase: Secondary | ICD-10-CM | POA: Diagnosis not present

## 2023-12-16 DIAGNOSIS — S7291XA Unspecified fracture of right femur, initial encounter for closed fracture: Secondary | ICD-10-CM | POA: Diagnosis not present

## 2023-12-16 DIAGNOSIS — Z4789 Encounter for other orthopedic aftercare: Secondary | ICD-10-CM | POA: Diagnosis not present

## 2023-12-16 DIAGNOSIS — L03213 Periorbital cellulitis: Secondary | ICD-10-CM | POA: Diagnosis not present

## 2023-12-17 DIAGNOSIS — R41841 Cognitive communication deficit: Secondary | ICD-10-CM | POA: Diagnosis not present

## 2023-12-17 DIAGNOSIS — S7291XA Unspecified fracture of right femur, initial encounter for closed fracture: Secondary | ICD-10-CM | POA: Diagnosis not present

## 2023-12-17 DIAGNOSIS — M6281 Muscle weakness (generalized): Secondary | ICD-10-CM | POA: Diagnosis not present

## 2023-12-17 DIAGNOSIS — Z4789 Encounter for other orthopedic aftercare: Secondary | ICD-10-CM | POA: Diagnosis not present

## 2023-12-17 DIAGNOSIS — R1312 Dysphagia, oropharyngeal phase: Secondary | ICD-10-CM | POA: Diagnosis not present

## 2023-12-17 DIAGNOSIS — Z9181 History of falling: Secondary | ICD-10-CM | POA: Diagnosis not present

## 2023-12-17 DIAGNOSIS — R269 Unspecified abnormalities of gait and mobility: Secondary | ICD-10-CM | POA: Diagnosis not present

## 2023-12-21 DIAGNOSIS — R1312 Dysphagia, oropharyngeal phase: Secondary | ICD-10-CM | POA: Diagnosis not present

## 2023-12-21 DIAGNOSIS — M6281 Muscle weakness (generalized): Secondary | ICD-10-CM | POA: Diagnosis not present

## 2023-12-21 DIAGNOSIS — R269 Unspecified abnormalities of gait and mobility: Secondary | ICD-10-CM | POA: Diagnosis not present

## 2023-12-21 DIAGNOSIS — S7291XA Unspecified fracture of right femur, initial encounter for closed fracture: Secondary | ICD-10-CM | POA: Diagnosis not present

## 2023-12-21 DIAGNOSIS — Z4789 Encounter for other orthopedic aftercare: Secondary | ICD-10-CM | POA: Diagnosis not present

## 2023-12-21 DIAGNOSIS — R41841 Cognitive communication deficit: Secondary | ICD-10-CM | POA: Diagnosis not present

## 2023-12-21 DIAGNOSIS — E039 Hypothyroidism, unspecified: Secondary | ICD-10-CM | POA: Diagnosis not present

## 2023-12-22 DIAGNOSIS — M6281 Muscle weakness (generalized): Secondary | ICD-10-CM | POA: Diagnosis not present

## 2023-12-22 DIAGNOSIS — Z9181 History of falling: Secondary | ICD-10-CM | POA: Diagnosis not present

## 2023-12-22 DIAGNOSIS — S7291XA Unspecified fracture of right femur, initial encounter for closed fracture: Secondary | ICD-10-CM | POA: Diagnosis not present

## 2023-12-22 DIAGNOSIS — Z4789 Encounter for other orthopedic aftercare: Secondary | ICD-10-CM | POA: Diagnosis not present

## 2023-12-22 DIAGNOSIS — R41841 Cognitive communication deficit: Secondary | ICD-10-CM | POA: Diagnosis not present

## 2023-12-22 DIAGNOSIS — R1312 Dysphagia, oropharyngeal phase: Secondary | ICD-10-CM | POA: Diagnosis not present

## 2023-12-23 DIAGNOSIS — R269 Unspecified abnormalities of gait and mobility: Secondary | ICD-10-CM | POA: Diagnosis not present

## 2023-12-23 DIAGNOSIS — Z4789 Encounter for other orthopedic aftercare: Secondary | ICD-10-CM | POA: Diagnosis not present

## 2023-12-23 DIAGNOSIS — R41841 Cognitive communication deficit: Secondary | ICD-10-CM | POA: Diagnosis not present

## 2023-12-23 DIAGNOSIS — M6281 Muscle weakness (generalized): Secondary | ICD-10-CM | POA: Diagnosis not present

## 2023-12-23 DIAGNOSIS — R1312 Dysphagia, oropharyngeal phase: Secondary | ICD-10-CM | POA: Diagnosis not present

## 2023-12-23 DIAGNOSIS — S7291XA Unspecified fracture of right femur, initial encounter for closed fracture: Secondary | ICD-10-CM | POA: Diagnosis not present

## 2023-12-24 DIAGNOSIS — Z9181 History of falling: Secondary | ICD-10-CM | POA: Diagnosis not present

## 2023-12-24 DIAGNOSIS — S7291XA Unspecified fracture of right femur, initial encounter for closed fracture: Secondary | ICD-10-CM | POA: Diagnosis not present

## 2023-12-24 DIAGNOSIS — R41841 Cognitive communication deficit: Secondary | ICD-10-CM | POA: Diagnosis not present

## 2023-12-24 DIAGNOSIS — M6281 Muscle weakness (generalized): Secondary | ICD-10-CM | POA: Diagnosis not present

## 2023-12-24 DIAGNOSIS — Z4789 Encounter for other orthopedic aftercare: Secondary | ICD-10-CM | POA: Diagnosis not present

## 2023-12-24 DIAGNOSIS — R1312 Dysphagia, oropharyngeal phase: Secondary | ICD-10-CM | POA: Diagnosis not present

## 2023-12-25 DIAGNOSIS — Z4789 Encounter for other orthopedic aftercare: Secondary | ICD-10-CM | POA: Diagnosis not present

## 2023-12-25 DIAGNOSIS — R269 Unspecified abnormalities of gait and mobility: Secondary | ICD-10-CM | POA: Diagnosis not present

## 2023-12-25 DIAGNOSIS — M6281 Muscle weakness (generalized): Secondary | ICD-10-CM | POA: Diagnosis not present

## 2023-12-28 DIAGNOSIS — R1312 Dysphagia, oropharyngeal phase: Secondary | ICD-10-CM | POA: Diagnosis not present

## 2023-12-28 DIAGNOSIS — Z4789 Encounter for other orthopedic aftercare: Secondary | ICD-10-CM | POA: Diagnosis not present

## 2023-12-28 DIAGNOSIS — R269 Unspecified abnormalities of gait and mobility: Secondary | ICD-10-CM | POA: Diagnosis not present

## 2023-12-28 DIAGNOSIS — S7291XA Unspecified fracture of right femur, initial encounter for closed fracture: Secondary | ICD-10-CM | POA: Diagnosis not present

## 2023-12-28 DIAGNOSIS — M6281 Muscle weakness (generalized): Secondary | ICD-10-CM | POA: Diagnosis not present

## 2023-12-28 DIAGNOSIS — R41841 Cognitive communication deficit: Secondary | ICD-10-CM | POA: Diagnosis not present

## 2023-12-29 DIAGNOSIS — M6281 Muscle weakness (generalized): Secondary | ICD-10-CM | POA: Diagnosis not present

## 2023-12-29 DIAGNOSIS — Z4789 Encounter for other orthopedic aftercare: Secondary | ICD-10-CM | POA: Diagnosis not present

## 2023-12-29 DIAGNOSIS — Z9181 History of falling: Secondary | ICD-10-CM | POA: Diagnosis not present

## 2023-12-30 DIAGNOSIS — M6281 Muscle weakness (generalized): Secondary | ICD-10-CM | POA: Diagnosis not present

## 2023-12-30 DIAGNOSIS — Z4789 Encounter for other orthopedic aftercare: Secondary | ICD-10-CM | POA: Diagnosis not present

## 2023-12-30 DIAGNOSIS — R269 Unspecified abnormalities of gait and mobility: Secondary | ICD-10-CM | POA: Diagnosis not present

## 2023-12-31 ENCOUNTER — Other Ambulatory Visit: Payer: Self-pay | Admitting: Internal Medicine

## 2023-12-31 DIAGNOSIS — Z9181 History of falling: Secondary | ICD-10-CM | POA: Diagnosis not present

## 2023-12-31 DIAGNOSIS — M6281 Muscle weakness (generalized): Secondary | ICD-10-CM | POA: Diagnosis not present

## 2023-12-31 DIAGNOSIS — Z4789 Encounter for other orthopedic aftercare: Secondary | ICD-10-CM | POA: Diagnosis not present

## 2023-12-31 DIAGNOSIS — R569 Unspecified convulsions: Secondary | ICD-10-CM

## 2023-12-31 MED ORDER — LACOSAMIDE 100 MG PO TABS: 1.0000 | ORAL_TABLET | Freq: Two times a day (BID) | ORAL | 3 refills | Status: DC

## 2024-01-01 DIAGNOSIS — M6281 Muscle weakness (generalized): Secondary | ICD-10-CM | POA: Diagnosis not present

## 2024-01-01 DIAGNOSIS — R269 Unspecified abnormalities of gait and mobility: Secondary | ICD-10-CM | POA: Diagnosis not present

## 2024-01-01 DIAGNOSIS — Z4789 Encounter for other orthopedic aftercare: Secondary | ICD-10-CM | POA: Diagnosis not present

## 2024-01-05 ENCOUNTER — Encounter: Payer: Self-pay | Admitting: Neurology

## 2024-01-05 ENCOUNTER — Ambulatory Visit: Admitting: Neurology

## 2024-01-05 VITALS — BP 145/88 | HR 87 | Ht 60.0 in | Wt 110.0 lb

## 2024-01-05 DIAGNOSIS — G40909 Epilepsy, unspecified, not intractable, without status epilepticus: Secondary | ICD-10-CM | POA: Diagnosis not present

## 2024-01-05 DIAGNOSIS — R269 Unspecified abnormalities of gait and mobility: Secondary | ICD-10-CM | POA: Diagnosis not present

## 2024-01-05 DIAGNOSIS — M6281 Muscle weakness (generalized): Secondary | ICD-10-CM | POA: Diagnosis not present

## 2024-01-05 DIAGNOSIS — Z8673 Personal history of transient ischemic attack (TIA), and cerebral infarction without residual deficits: Secondary | ICD-10-CM | POA: Diagnosis not present

## 2024-01-05 DIAGNOSIS — R030 Elevated blood-pressure reading, without diagnosis of hypertension: Secondary | ICD-10-CM

## 2024-01-05 DIAGNOSIS — Z4789 Encounter for other orthopedic aftercare: Secondary | ICD-10-CM | POA: Diagnosis not present

## 2024-01-05 NOTE — Progress Notes (Signed)
 Subjective:    Patient ID: Theresa [name] is a 89 y.o. female.  HPI    Interim history:   Theresa [name] is a 89 year old female with an underlying medical history of stroke, A-fib, colitis, colonic polyps, hyperlipidemia, depression, chronic kidney disease, mitral valve prolapse, osteoporosis and osteoarthritis, right hip fracture with status post right hip hemiarthroplasty in December 2024, hypothyroidism and memory loss, who presents for follow-up consultation after her stroke earlier this year, deemed embolic secondary to A-fib.  The patient is unaccompanied and came [facility].  She was last seen in our clinic in March 2025 by Duwaine Russell, NP, at which time her MMSE was 26.  She was advised to have close follow-up with PCP, continue with Eliquis , continue with her medication regimen and continue with low-dose Vimpat  50 mg twice daily as there had been concern of possible focal seizures during the hospitalization back in January 2025.  Today, 01/05/2024: She reports feeling stable.  She does admit to having had a fall about a week ago, reports that she did not injure herself, even upon repeated asking, she denies any pain, landed on her back and behind, did not hit her head or lost consciousness.  She uses a rolling walker.  She tries to hydrate well with water and some pills she takes with applesauce.  She denies any new weakness or numbness.  She is not completely sure about her medications.  Medication list on her MAR was reconciled with our records.  Previously (copied from previous notes for reference):    06/03/2023 Theresa Russell, NP): <<Theresa [name] is a 89 y.o. female who is here today for Hospital follow-up.    She was admitted to the ED on 04/06/23 after experiencing three episodes of transient confusion and speech difficulty. MRI results showed multifocal acute ischemic strokes. She reports feeling well today and denies any  further episodes of confusion since the stroke. While she has not noticed any new neurological deficits, she does mention a change in her speech--it's slower and raspy, which may differ from her baseline. She also continues to have difficulty with word-finding, a chronic issue she has had even before the recent stroke. She is compliant with her medications, including Eliquis , and reports no concerns or side effects with her current treatment.   Had a second visit to the ED in February for transient aphasia and left-sided weakness.  Due to EEG showing slowing she was placed on Vimpat .  The patient is preparing to move into an assisted living facility soon, where she will live with her husband. She reports no issues with memory and is able to manage her finances independently. Prior to her hip fracture, she was able to complete all activities of daily living (ADLs) on her own, but since the fracture, she requires some assistance, particularly with mobility. She currently uses a walker and receives help with some tasks. She does not cook and eats at her friend's home. In December 2024, she experienced a right hip fracture and underwent hemi hip arthroplasty. Additionally, she notes mild left arm weakness, which she attributes to a childhood fracture.   EEG IMPRESSION: This study is suggestive of cortical dysfunction arising from left  fronto-temporal region. No seizures or epileptiform discharges were seen throughout the recording.   MRI brain wo contrast: IMPRESSION:05/14/23 1. No acute intracranial abnormality. 2. Chronic microvascular ischemic changes and parenchymal volume loss. 3. Remote infarct in the left frontal operculum. Additional small remote infarcts  in the bilateral basal ganglia and cerebellum.   CTA HEAD AND NECK:04/06/23   1. Negative CTA for large vessel occlusion or other emergent finding. 2. Mild for age atheromatous disease without hemodynamically significant stenosis. 3.   Aortic Atherosclerosis (ICD10-I70.0). 4. Small layering right pleural effusion.   MRI brain wo contrast 04/06/23: IMPRESSION: 1. Multiple small foci of restricted diffusion in the superior left cerebellar hemisphere, left occipital lobe, bilateral frontal lobes and right parietal lobe, consistent with acute infarcts, likely embolic. 2. Area of encephalomalacia and gliosis in the left frontoparietal region. 3. Multiple small chronic infarcts in the bilateral cerebellar hemispheres. 4. Moderate parenchymal volume loss.      HISTORY [name] is a 89 y.o. female with PMH significant for atrial fibrillation (on Xarelto  at home), hypertension, hyperlipidemia, left parietal stroke in 2012, hypothyroidism, MVR 2008, OA, recent right hip fracture status post Hemi hip arthroplasty December 2024 who presented to the ED 1/20 due to 3 episodes of transient confusion and speech difficulty. MRI brain reveals multifocal punctate acute ischemic strokes.  CTA negative for LVO. Patient endorsed compliance with Xarelto .   Stroke: left cerebellar, L MCA/PCA, R MCA/ACA punctate acute ischemic strokes Etiology: Likely due to atrial fibrillation with failure of Xarelto  ? CTA head & neck  ? Negative CTA for large vessel occlusion or other emergent finding. Mild for age atheromatous disease without hemodynamically significant stenosis. Aortic atherosclerosis  ? MRI   ? Several punctate/small foci of restricted diffusion are seen: one in the superior left cerebellar hemisphere, one in the left occipital lobe, the bilateral frontal lobes and right parietal lobe, consistent with acute infarcts, likely embolic. Area of encephalomalacia and gliosis in the left frontoparietal region is also noted. Multiple small chronic infarcts in the bilateral cerebellar hemispheres. Moderate parenchymal volume loss.  ? VAS US  LE DVT: Negative ? 2D Echo EF 55-60% ? LDL 57 ? HgbA1c 5.5 in 02/2023 ? VTE prophylaxis -  Eliquis   ? Xarelto  (rivaroxaban ) daily prior to admission, switch to Eliquis  2.5 mg twice daily ? Therapy recommendations:  SNF ? Disposition: Pending   Hx of Stroke/TIA ? 02/2011 admitted for aphasia and right facial droop.  MRI: Left parietal infarct.  Changed from aspirin to Plavix, put on Lipitor .   ? Started on Coumadin  around 05/2011 ? 03/2013 admitted for transient left-sided weakness and slurring speech.  MRI negative.  Continue on Xarelto , and recommend to take with big meal >>  05/24/2018 (SA): Ms. Lennartz is an 89 year old right-handed woman with an underlying medical history of A. fib, stroke, history of colitis, colonic polyps, hyperlipidemia, depression, chronic kidney disease, hypothyroidism, MVP, osteoporosis, and osteoarthritis, who reports difficulty with her balance for the past few years. I reviewed your office note from 05/03/2018. She was previously followed by neurology at [facility], by Drs. Cyrena, then [place], for her history of stroke and memory loss. Of note, she had a brain MRI without contrast and MRA head without contrast on 03/21/2013 which I reviewed: IMPRESSION: No acute infarction. Generalized atrophy. Old small vessel cerebellar infarctions. Old left parietal cortical and subcortical infarction.   No major vessel occlusion or correctable proximal stenosis. She does report a stroke many years ago affecting her right side. She has had worsening difficulty with her speech. She has not fallen recently.she had physical therapy a couple years ago and was offered physical therapy again. She found PT not very effective. She has been using a cane for the past several months off and on. She also  bought a 2 wheeled walker maybe a month ago. She does not smoke, she admits that she does not always drink a lot of water. She drinks alcohol in the form of wine, 6 ounces per day on average. She reports that she has scoliosis.   Her Past Medical History Is Significant For: Past  Medical History:  Diagnosis Date   Atrial fibrillation (HCC)    AFIB   Collagenous colitis    CVA (cerebral vascular accident) (HCC)    a.  L parietal CVA by MRI 12/12, likely embolic;  b.  TEE by report with neg bubble study;  c.  carotid dopplers  12/12:  + plaque, no sig ICA stenosis    History of postmenopausal HRT    Hx of adenomatous colonic polyps    Hypothyroidism    MVP (mitral valve prolapse)    a. s/p MV repair at [facility] in 2008;  b. Echocardiogram 7/12: EF 50%, status post mitral valve repair with mild MR and minimal MS, mean gradient 4, moderate LAE, LA diam 55 mm; mild RAE, PASP 28-32;    Osteoarthritis    Osteoporosis     Her Past Surgical History Is Significant For: Past Surgical History:  Procedure Laterality Date   ANTERIOR APPROACH HEMI HIP ARTHROPLASTY Right 03/05/2023   Procedure: ANTERIOR APPROACH HEMI HIP ARTHROPLASTY;  Surgeon: Fidel Rogue, MD;  Location: [facility] ORS;  Service: Orthopedics;  Laterality: Right;   CARDIOVERSION  01/09/2012   Procedure: CARDIOVERSION;  Surgeon: Toribio JONELLE Fuel, MD;  Location: [facility]COPY;  Service: Cardiovascular;  Laterality: N/A;   CATARACT EXTRACTION     CHOLECYSTECTOMY     DILATION AND CURETTAGE OF UTERUS     LUMBAR FUSION     MITRAL VALVE REPAIR  03/17/2006   mitral valve repair CE ring and maze procedure   TONSILLECTOMY      Her Family History Is Significant For: Family History  Problem Relation Age of Onset   Cancer Father        COLON   Stroke Neg Hx        1ST DEGREE RELATIVE <60    Her Social History Is Significant For: Social History   Socioeconomic History   Marital status: Married    Spouse name: Not on file   Number of children: Not on file   Years of education: Not on file   Highest education level: Master's degree (e.g., MA, MS, MEng, MEd, MSW, MBA)  Occupational History   Occupation: RETIRED    Employer: RETIRED    Comment: Psychiatric nurse  Tobacco Use   Smoking status: Former    Current  packs/day: 0.00    Types: Cigarettes    Quit date: 04/28/1966    Years since quitting: 57.7   Smokeless tobacco: Never  Vaping Use   Vaping status: Never Used  Substance and Sexual Activity   Alcohol use: Yes    Alcohol/week: 7.0 standard drinks of alcohol    Types: 7 Glasses of wine per week    Comment: 1 small glass with luch and dinner   Drug use: No   Sexual activity: Not Currently  Other Topics Concern   Not on file  Social History Narrative   Married for 12 years-1st marriage for both. No kids. No pets.       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   Lived in [place] for 10 years prior (masters in music and music education)-undergrad at [employer]  Hobbies: eating out, opera in [place]   Social Drivers of Health   Financial Resource Strain: Low Risk  (03/02/2023)   Overall Financial Resource Strain (CARDIA)    Difficulty of Paying Living Expenses: Not hard at all  Food Insecurity: No Food Insecurity (04/07/2023)   Hunger Vital Sign    Worried About Running Out of Food in the Last Year: Never true    Ran Out of Food in the Last Year: Never true  Transportation Needs: No Transportation Needs (04/07/2023)   PRAPARE - Administrator, Civil Service (Medical): No    Lack of Transportation (Non-Medical): No  Physical Activity: Insufficiently Active (03/02/2023)   Exercise Vital Sign    Days of Exercise per Week: 5 days    Minutes of Exercise per Session: 20 min  Stress: No Stress Concern Present (03/02/2023)   [employer] of Occupational Health - Occupational Stress Questionnaire    Feeling of Stress : Not at all  Social Connections: Moderately Isolated (04/07/2023)   Social Connection and Isolation Panel    Frequency of Communication with Friends and Family: Once a week    Frequency of Social Gatherings with Friends and Family: More than three times a week    Attends Religious Services: Never    Database administrator or  Organizations: No    Attends Banker Meetings: Never    Marital Status: Married    Her Allergies Are:  Allergies  Allergen Reactions   Tape Other (See Comments)    Skin tears and bruises easily.   Vioxx [Rofecoxib] Other (See Comments)    Elevated LFT's  :   Her Current Medications Are:  Outpatient Encounter Medications as of 01/05/2024  Medication Sig   apixaban  (ELIQUIS ) 2.5 MG TABS tablet Take 1 tablet (2.5 mg total) by mouth 2 (two) times daily.   ascorbic acid  (VITAMIN C ) 1000 MG tablet Take 1,000 mg by mouth daily.   Calcium  Citrate-Vitamin D  (CITRACAL PETITES/VITAMIN D ) 200-6.25 MG-MCG TABS Take 1 tablet by mouth 2 (two) times daily.   docusate sodium  (COLACE) 100 MG capsule Take 1 capsule (100 mg total) by mouth 2 (two) times daily.   escitalopram  (LEXAPRO ) 20 MG tablet Take 20 mg by mouth daily.   fluticasone  (FLONASE ) 50 MCG/ACT nasal spray Place 2 sprays into both nostrils daily.   glucosamine-chondroitin 500-400 MG tablet Take 1 tablet by mouth 2 (two) times daily.   Lacosamide  100 MG TABS Take 1 tablet (100 mg total) by mouth in the morning and at bedtime.   lactose free nutrition (BOOST) LIQD Take 237 mLs by mouth daily.   levothyroxine  (SYNTHROID ) 75 MCG tablet TAKE 1 TABLET BY MOUTH EVERY DAY BEFORE BREAKFAST   loratadine  (CLARITIN ) 10 MG tablet Take 1 tablet (10 mg total) by mouth daily.   metoprolol  tartrate (LOPRESSOR ) 25 MG tablet Take 1 tablet (25 mg total) by mouth 2 (two) times daily.   Multiple Vitamins-Minerals (PRESERVISION AREDS 2 PO) Take 1 capsule by mouth 2 (two) times daily. Reported on 09/04/2015   polyethylene glycol (MIRALAX  / GLYCOLAX ) 17 g packet Take 17 g by mouth daily as needed for mild constipation.   rosuvastatin  (CRESTOR ) 10 MG tablet Take 10 mg by mouth once. 10 mg by mouth in the evening for HLD   Saline (SIMPLY SALINE) 0.9 % AERS Place 2 each into the nose daily as needed.   TYLENOL  8 HOUR ARTHRITIS PAIN 650 MG CR tablet Take  650 mg by mouth daily.  vitamin A 10000 UNIT capsule Take 1 capsule by mouth daily.   zinc  oxide 20 % ointment Apply 1 Application topically See admin instructions. Apply topically to peri/buttocks as needed after each incontinent episode for skin protection.   [DISCONTINUED] amoxicillin  (AMOXIL ) 500 MG tablet Take 2,000 mg by mouth daily as needed.   No facility-administered encounter medications on file as of 01/05/2024.  :  Review of Systems:  Out of a complete 14 point review of systems, all are reviewed and negative with the exception of these symptoms as listed below:   Review of Systems  Neurological:        Patient is here for CVA follow up.     Objective:  Neurological Exam  Physical Exam Physical Examination:   Vitals:   01/05/24 0951  BP: (!) 160/98  Pulse: 87   She denies any symptoms from her elevated blood pressure value.  Recheck at the end of the visit 145/88.  General Examination: The patient is a very pleasant 89 y.o. female in no acute distress. She appears well groomed.  She is frail, able to answer questions appropriately.  HEENT: Normocephalic, atraumatic, pupils are equal, round and reactive to light and accommodation.Hearing is grossly intact, she has no facial asymmetry, minimal dysarthria is noted. Extraocular tracking is preserved. Airway examination reveals mild to moderate mouth dryness, no tongue deviation, and palate elevates symmetrically. She has no carotid bruit.   Chest: Clear to auscultation without wheezing, rhonchi or crackles noted.   Heart: S1+S2+0, Irregularly irregular. No murmurs.    Abdomen: Soft, non-tender and non-distended with normal bowel sounds appreciated on auscultation.   Extremities: There is no pitting edema in the distal lower extremities bilaterally. Pedal pulses are intact.   Skin: Warm and dry without trophic changes noted.   Musculoskeletal: exam reveals prominent arthritic changes in both hands.     Neurologically:  Mental status: The patient is awake, alert and oriented in all 4 spheres. Her immediate and remote memory, attention, language skills and fund of knowledge are appropriate. There is no evidence of aphasia, agnosia, apraxia or anomia. Speech is clear with normal prosody and enunciation. Thought process is linear. Mood is normal and affect is normal.  Cranial nerves II - XII are as described above under HEENT exam. In addition: shoulder shrug is normal with equal shoulder height noted. Motor exam: thin bulk, global strength of 4+/5, normal tone is noted. There is no drift, tremor or rebound. Romberg is not tested secondary to safety concerns. Reflexes are 1+. Babinski: Toes are down. Fine motor skills and coordination: grossly intact for age. Cerebellar testing: No dysmetria or intention tremor on finger to nose testing. Heel to shin is unremarkable bilaterally. There is no truncal or gait ataxia.  Sensory exam: intact to light touch in the upper and lower extremities.  Gait, station and balance: She stands easily. No veering to one side is noted. Posture is mildly stooped and she does have some scoliosis. No leaning to one side is noted. Stance is slightly wider base. She walks somewhat insecurely slightly wider based. Preserved arm swing noted. She turns insecurely. She has a 2 wheeled walker which she maneuvers well.    Assessment and Plan:   In summary, Dorene Bruni is a 89 year old female with an underlying medical history of stroke, A-fib, colitis, colonic polyps, hyperlipidemia, depression, chronic kidney disease, mitral valve prolapse, osteoporosis and osteoarthritis, right hip fracture with status post right hip hemiarthroplasty in December 2024, hypothyroidism and memory loss,  who presents for follow-up consultation of her history of embolic strokes in January 2025 and suspected partial seizures, on Vimpat , which is currently 100 mg twice daily.  Her blood pressure is  elevated today upon triage and we rechecked it.  It came down a little bit.  She did not have any symptoms from blood pressure elevation.  We talked about her condition at length, we talked about the importance of fall prevention.  She is advised to continue with her current management and follow-up in our clinic routinely in about 6 months to see the nurse practitioner again.  We talked about the importance of maintaining a healthy lifestyle, good nutrition and good hydration with water.  She is advised to use her walker at all times.  I answered all her questions today and she was in agreement with the plan.  I spent 45 minutes in total face-to-face time and in reviewing records during pre-charting, more than 50% of which was spent in counseling and coordination of care, reviewing test results, reviewing medications and treatment regimen and/or in discussing or reviewing the diagnosis of stroke, partial seizures, the prognosis and treatment options. Pertinent laboratory and imaging test results that were available during this visit with the patient were reviewed by me and considered in my medical decision making (see chart for details).

## 2024-01-06 DIAGNOSIS — R269 Unspecified abnormalities of gait and mobility: Secondary | ICD-10-CM | POA: Diagnosis not present

## 2024-01-06 DIAGNOSIS — Z9181 History of falling: Secondary | ICD-10-CM | POA: Diagnosis not present

## 2024-01-06 DIAGNOSIS — Z4789 Encounter for other orthopedic aftercare: Secondary | ICD-10-CM | POA: Diagnosis not present

## 2024-01-06 DIAGNOSIS — M6281 Muscle weakness (generalized): Secondary | ICD-10-CM | POA: Diagnosis not present

## 2024-01-06 DIAGNOSIS — R41841 Cognitive communication deficit: Secondary | ICD-10-CM | POA: Diagnosis not present

## 2024-01-07 DIAGNOSIS — Z9181 History of falling: Secondary | ICD-10-CM | POA: Diagnosis not present

## 2024-01-07 DIAGNOSIS — Z4789 Encounter for other orthopedic aftercare: Secondary | ICD-10-CM | POA: Diagnosis not present

## 2024-01-07 DIAGNOSIS — M6281 Muscle weakness (generalized): Secondary | ICD-10-CM | POA: Diagnosis not present

## 2024-01-08 DIAGNOSIS — Z4789 Encounter for other orthopedic aftercare: Secondary | ICD-10-CM | POA: Diagnosis not present

## 2024-01-08 DIAGNOSIS — R269 Unspecified abnormalities of gait and mobility: Secondary | ICD-10-CM | POA: Diagnosis not present

## 2024-01-08 DIAGNOSIS — M6281 Muscle weakness (generalized): Secondary | ICD-10-CM | POA: Diagnosis not present

## 2024-01-08 DIAGNOSIS — R41841 Cognitive communication deficit: Secondary | ICD-10-CM | POA: Diagnosis not present

## 2024-01-11 DIAGNOSIS — R41841 Cognitive communication deficit: Secondary | ICD-10-CM | POA: Diagnosis not present

## 2024-01-12 DIAGNOSIS — M6281 Muscle weakness (generalized): Secondary | ICD-10-CM | POA: Diagnosis not present

## 2024-01-12 DIAGNOSIS — Z4789 Encounter for other orthopedic aftercare: Secondary | ICD-10-CM | POA: Diagnosis not present

## 2024-01-12 DIAGNOSIS — R269 Unspecified abnormalities of gait and mobility: Secondary | ICD-10-CM | POA: Diagnosis not present

## 2024-01-12 DIAGNOSIS — Z9181 History of falling: Secondary | ICD-10-CM | POA: Diagnosis not present

## 2024-01-13 DIAGNOSIS — M6281 Muscle weakness (generalized): Secondary | ICD-10-CM | POA: Diagnosis not present

## 2024-01-13 DIAGNOSIS — Z4789 Encounter for other orthopedic aftercare: Secondary | ICD-10-CM | POA: Diagnosis not present

## 2024-01-13 DIAGNOSIS — R269 Unspecified abnormalities of gait and mobility: Secondary | ICD-10-CM | POA: Diagnosis not present

## 2024-01-13 DIAGNOSIS — R41841 Cognitive communication deficit: Secondary | ICD-10-CM | POA: Diagnosis not present

## 2024-01-14 DIAGNOSIS — H34831 Tributary (branch) retinal vein occlusion, right eye, with macular edema: Secondary | ICD-10-CM | POA: Diagnosis not present

## 2024-01-14 DIAGNOSIS — Z4789 Encounter for other orthopedic aftercare: Secondary | ICD-10-CM | POA: Diagnosis not present

## 2024-01-14 DIAGNOSIS — M6281 Muscle weakness (generalized): Secondary | ICD-10-CM | POA: Diagnosis not present

## 2024-01-14 DIAGNOSIS — Z9181 History of falling: Secondary | ICD-10-CM | POA: Diagnosis not present

## 2024-01-15 DIAGNOSIS — R269 Unspecified abnormalities of gait and mobility: Secondary | ICD-10-CM | POA: Diagnosis not present

## 2024-01-15 DIAGNOSIS — R41841 Cognitive communication deficit: Secondary | ICD-10-CM | POA: Diagnosis not present

## 2024-01-15 DIAGNOSIS — M6281 Muscle weakness (generalized): Secondary | ICD-10-CM | POA: Diagnosis not present

## 2024-01-15 DIAGNOSIS — Z4789 Encounter for other orthopedic aftercare: Secondary | ICD-10-CM | POA: Diagnosis not present

## 2024-01-18 DIAGNOSIS — R41841 Cognitive communication deficit: Secondary | ICD-10-CM | POA: Diagnosis not present

## 2024-01-19 DIAGNOSIS — Z9181 History of falling: Secondary | ICD-10-CM | POA: Diagnosis not present

## 2024-01-19 DIAGNOSIS — Z4789 Encounter for other orthopedic aftercare: Secondary | ICD-10-CM | POA: Diagnosis not present

## 2024-01-19 DIAGNOSIS — M6281 Muscle weakness (generalized): Secondary | ICD-10-CM | POA: Diagnosis not present

## 2024-01-20 DIAGNOSIS — R41841 Cognitive communication deficit: Secondary | ICD-10-CM | POA: Diagnosis not present

## 2024-01-20 DIAGNOSIS — R269 Unspecified abnormalities of gait and mobility: Secondary | ICD-10-CM | POA: Diagnosis not present

## 2024-01-20 DIAGNOSIS — Z4789 Encounter for other orthopedic aftercare: Secondary | ICD-10-CM | POA: Diagnosis not present

## 2024-01-20 DIAGNOSIS — M6281 Muscle weakness (generalized): Secondary | ICD-10-CM | POA: Diagnosis not present

## 2024-01-21 DIAGNOSIS — Z4789 Encounter for other orthopedic aftercare: Secondary | ICD-10-CM | POA: Diagnosis not present

## 2024-01-21 DIAGNOSIS — M6281 Muscle weakness (generalized): Secondary | ICD-10-CM | POA: Diagnosis not present

## 2024-01-21 DIAGNOSIS — Z9181 History of falling: Secondary | ICD-10-CM | POA: Diagnosis not present

## 2024-01-22 DIAGNOSIS — M6281 Muscle weakness (generalized): Secondary | ICD-10-CM | POA: Diagnosis not present

## 2024-01-22 DIAGNOSIS — Z4789 Encounter for other orthopedic aftercare: Secondary | ICD-10-CM | POA: Diagnosis not present

## 2024-01-22 DIAGNOSIS — R41841 Cognitive communication deficit: Secondary | ICD-10-CM | POA: Diagnosis not present

## 2024-01-22 DIAGNOSIS — R269 Unspecified abnormalities of gait and mobility: Secondary | ICD-10-CM | POA: Diagnosis not present

## 2024-01-25 DIAGNOSIS — Z4789 Encounter for other orthopedic aftercare: Secondary | ICD-10-CM | POA: Diagnosis not present

## 2024-01-25 DIAGNOSIS — R269 Unspecified abnormalities of gait and mobility: Secondary | ICD-10-CM | POA: Diagnosis not present

## 2024-01-25 DIAGNOSIS — M6281 Muscle weakness (generalized): Secondary | ICD-10-CM | POA: Diagnosis not present

## 2024-01-25 DIAGNOSIS — R41841 Cognitive communication deficit: Secondary | ICD-10-CM | POA: Diagnosis not present

## 2024-01-26 DIAGNOSIS — Z4789 Encounter for other orthopedic aftercare: Secondary | ICD-10-CM | POA: Diagnosis not present

## 2024-01-26 DIAGNOSIS — R269 Unspecified abnormalities of gait and mobility: Secondary | ICD-10-CM | POA: Diagnosis not present

## 2024-01-26 DIAGNOSIS — M6281 Muscle weakness (generalized): Secondary | ICD-10-CM | POA: Diagnosis not present

## 2024-01-27 DIAGNOSIS — Z9181 History of falling: Secondary | ICD-10-CM | POA: Diagnosis not present

## 2024-01-27 DIAGNOSIS — R41841 Cognitive communication deficit: Secondary | ICD-10-CM | POA: Diagnosis not present

## 2024-01-27 DIAGNOSIS — M6281 Muscle weakness (generalized): Secondary | ICD-10-CM | POA: Diagnosis not present

## 2024-01-27 DIAGNOSIS — Z4789 Encounter for other orthopedic aftercare: Secondary | ICD-10-CM | POA: Diagnosis not present

## 2024-01-27 DIAGNOSIS — R269 Unspecified abnormalities of gait and mobility: Secondary | ICD-10-CM | POA: Diagnosis not present

## 2024-01-28 DIAGNOSIS — Z4789 Encounter for other orthopedic aftercare: Secondary | ICD-10-CM | POA: Diagnosis not present

## 2024-01-28 DIAGNOSIS — Z9181 History of falling: Secondary | ICD-10-CM | POA: Diagnosis not present

## 2024-01-28 DIAGNOSIS — M6281 Muscle weakness (generalized): Secondary | ICD-10-CM | POA: Diagnosis not present

## 2024-01-29 DIAGNOSIS — R41841 Cognitive communication deficit: Secondary | ICD-10-CM | POA: Diagnosis not present

## 2024-02-01 ENCOUNTER — Other Ambulatory Visit: Payer: Self-pay | Admitting: Nurse Practitioner

## 2024-02-01 DIAGNOSIS — R569 Unspecified convulsions: Secondary | ICD-10-CM

## 2024-02-01 DIAGNOSIS — R41841 Cognitive communication deficit: Secondary | ICD-10-CM | POA: Diagnosis not present

## 2024-02-01 DIAGNOSIS — Z4789 Encounter for other orthopedic aftercare: Secondary | ICD-10-CM | POA: Diagnosis not present

## 2024-02-01 DIAGNOSIS — M6281 Muscle weakness (generalized): Secondary | ICD-10-CM | POA: Diagnosis not present

## 2024-02-01 DIAGNOSIS — Z9181 History of falling: Secondary | ICD-10-CM | POA: Diagnosis not present

## 2024-02-01 MED ORDER — LACOSAMIDE 100 MG PO TABS: 1.0000 | ORAL_TABLET | Freq: Two times a day (BID) | ORAL | 3 refills | Status: DC

## 2024-02-03 DIAGNOSIS — R41841 Cognitive communication deficit: Secondary | ICD-10-CM | POA: Diagnosis not present

## 2024-02-05 DIAGNOSIS — R41841 Cognitive communication deficit: Secondary | ICD-10-CM | POA: Diagnosis not present

## 2024-02-11 DIAGNOSIS — R41841 Cognitive communication deficit: Secondary | ICD-10-CM | POA: Diagnosis not present

## 2024-02-15 ENCOUNTER — Encounter (HOSPITAL_COMMUNITY): Payer: Self-pay

## 2024-02-15 ENCOUNTER — Emergency Department (HOSPITAL_COMMUNITY)

## 2024-02-15 ENCOUNTER — Emergency Department (HOSPITAL_COMMUNITY)
Admission: EM | Admit: 2024-02-15 | Discharge: 2024-02-15 | Disposition: A | Discharge status: Skilled Nursing Facility | Attending: Emergency Medicine | Admitting: Emergency Medicine

## 2024-02-15 ENCOUNTER — Other Ambulatory Visit: Payer: Self-pay

## 2024-02-15 DIAGNOSIS — Z7901 Long term (current) use of anticoagulants: Secondary | ICD-10-CM | POA: Diagnosis not present

## 2024-02-15 DIAGNOSIS — Z8673 Personal history of transient ischemic attack (TIA), and cerebral infarction without residual deficits: Secondary | ICD-10-CM | POA: Diagnosis not present

## 2024-02-15 DIAGNOSIS — Z981 Arthrodesis status: Secondary | ICD-10-CM | POA: Diagnosis not present

## 2024-02-15 DIAGNOSIS — S199XXA Unspecified injury of neck, initial encounter: Secondary | ICD-10-CM | POA: Diagnosis not present

## 2024-02-15 DIAGNOSIS — R0989 Other specified symptoms and signs involving the circulatory and respiratory systems: Secondary | ICD-10-CM | POA: Diagnosis not present

## 2024-02-15 DIAGNOSIS — Z79899 Other long term (current) drug therapy: Secondary | ICD-10-CM | POA: Diagnosis not present

## 2024-02-15 DIAGNOSIS — S0990XA Unspecified injury of head, initial encounter: Secondary | ICD-10-CM | POA: Diagnosis not present

## 2024-02-15 DIAGNOSIS — W1839XA Other fall on same level, initial encounter: Secondary | ICD-10-CM | POA: Insufficient documentation

## 2024-02-15 DIAGNOSIS — I6782 Cerebral ischemia: Secondary | ICD-10-CM | POA: Diagnosis not present

## 2024-02-15 DIAGNOSIS — Y92129 Unspecified place in nursing home as the place of occurrence of the external cause: Secondary | ICD-10-CM | POA: Diagnosis not present

## 2024-02-15 DIAGNOSIS — I480 Paroxysmal atrial fibrillation: Secondary | ICD-10-CM | POA: Insufficient documentation

## 2024-02-15 DIAGNOSIS — N189 Chronic kidney disease, unspecified: Secondary | ICD-10-CM | POA: Insufficient documentation

## 2024-02-15 DIAGNOSIS — M4802 Spinal stenosis, cervical region: Secondary | ICD-10-CM | POA: Diagnosis not present

## 2024-02-15 DIAGNOSIS — W19XXXA Unspecified fall, initial encounter: Secondary | ICD-10-CM

## 2024-02-15 DIAGNOSIS — R918 Other nonspecific abnormal finding of lung field: Secondary | ICD-10-CM | POA: Diagnosis not present

## 2024-02-15 DIAGNOSIS — S50312A Abrasion of left elbow, initial encounter: Secondary | ICD-10-CM | POA: Diagnosis not present

## 2024-02-15 DIAGNOSIS — M4312 Spondylolisthesis, cervical region: Secondary | ICD-10-CM | POA: Diagnosis not present

## 2024-02-15 DIAGNOSIS — Z96641 Presence of right artificial hip joint: Secondary | ICD-10-CM | POA: Diagnosis not present

## 2024-02-15 DIAGNOSIS — Z043 Encounter for examination and observation following other accident: Secondary | ICD-10-CM | POA: Diagnosis not present

## 2024-02-15 DIAGNOSIS — R41841 Cognitive communication deficit: Secondary | ICD-10-CM | POA: Diagnosis not present

## 2024-02-15 DIAGNOSIS — M47812 Spondylosis without myelopathy or radiculopathy, cervical region: Secondary | ICD-10-CM | POA: Diagnosis not present

## 2024-02-15 DIAGNOSIS — M19022 Primary osteoarthritis, left elbow: Secondary | ICD-10-CM | POA: Diagnosis not present

## 2024-02-15 DIAGNOSIS — R220 Localized swelling, mass and lump, head: Secondary | ICD-10-CM | POA: Diagnosis not present

## 2024-02-15 LAB — CBC WITH DIFFERENTIAL/PLATELET
Abs Immature Granulocytes: 0.06 K/uL (ref 0.00–0.07)
Basophils Absolute: 0.1 K/uL (ref 0.0–0.1)
Basophils Relative: 1 %
Eosinophils Absolute: 0.1 K/uL (ref 0.0–0.5)
Eosinophils Relative: 1 %
HCT: 41.6 % (ref 36.0–46.0)
Hemoglobin: 13.4 g/dL (ref 12.0–15.0)
Immature Granulocytes: 1 %
Lymphocytes Relative: 21 %
Lymphs Abs: 2 K/uL (ref 0.7–4.0)
MCH: 31.1 pg (ref 26.0–34.0)
MCHC: 32.2 g/dL (ref 30.0–36.0)
MCV: 96.5 fL (ref 80.0–100.0)
Monocytes Absolute: 0.4 K/uL (ref 0.1–1.0)
Monocytes Relative: 5 %
Neutro Abs: 6.9 K/uL (ref 1.7–7.7)
Neutrophils Relative %: 71 %
Platelets: 188 K/uL (ref 150–400)
RBC: 4.31 MIL/uL (ref 3.87–5.11)
RDW: 13.2 % (ref 11.5–15.5)
WBC: 9.6 K/uL (ref 4.0–10.5)
nRBC: 0 % (ref 0.0–0.2)

## 2024-02-15 LAB — I-STAT CHEM 8, ED
BUN: 24 mg/dL — ABNORMAL HIGH (ref 8–23)
Calcium, Ion: 1.17 mmol/L (ref 1.15–1.40)
Chloride: 104 mmol/L (ref 98–111)
Creatinine, Ser: 1.3 mg/dL — ABNORMAL HIGH (ref 0.44–1.00)
Glucose, Bld: 126 mg/dL — ABNORMAL HIGH (ref 70–99)
HCT: 41 % (ref 36.0–46.0)
Hemoglobin: 13.9 g/dL (ref 12.0–15.0)
Potassium: 4.4 mmol/L (ref 3.5–5.1)
Sodium: 140 mmol/L (ref 135–145)
TCO2: 25 mmol/L (ref 22–32)

## 2024-02-15 LAB — TYPE AND SCREEN
ABO/RH(D): O POS
Antibody Screen: NEGATIVE

## 2024-02-15 LAB — BASIC METABOLIC PANEL WITH GFR
Anion gap: 8 (ref 5–15)
BUN: 24 mg/dL — ABNORMAL HIGH (ref 8–23)
CO2: 28 mmol/L (ref 22–32)
Calcium: 9.3 mg/dL (ref 8.9–10.3)
Chloride: 103 mmol/L (ref 98–111)
Creatinine, Ser: 1.15 mg/dL — ABNORMAL HIGH (ref 0.44–1.00)
GFR, Estimated: 45 mL/min — ABNORMAL LOW (ref >60)
Glucose, Bld: 126 mg/dL — ABNORMAL HIGH (ref 70–99)
Potassium: 4.4 mmol/L (ref 3.5–5.1)
Sodium: 139 mmol/L (ref 135–145)

## 2024-02-15 NOTE — ED Notes (Signed)
 POA Rosaline Sharps with [facility] at bedside.

## 2024-02-15 NOTE — ED Provider Notes (Signed)
 [place] EMERGENCY DEPARTMENT AT [facility] Provider Note   CSN: [id number] Arrival date & time: 02/15/24  9048     Patient presents with: Theresa [name] is a 89 y.o. female.   89 year old female with prior medical history as detailed below presents for evaluation.  She resides at local SNF.  She had a witnessed fall this morning.  Staff reported brief episode of shaking lasting approximately 15 seconds after the fall.  Patient with history of seizure and is on lacosamide  for same.  She apparently was not postictal.  She denies pain or injury on evaluation today.  C-collar placed by EMS.  She is on Eliquis  with history of A-fib.  DNR/DNI form arrives with the patient.  Prior medical history includes stroke, seizures, paroxysmal atrial fibrillation, on chronic anticoagulation, CKD.  The history is provided by the patient, medical records and the EMS personnel.       Prior to Admission medications   Medication Sig Start Date End Date Taking? Authorizing Provider  apixaban  (ELIQUIS ) 2.5 MG TABS tablet Take 1 tablet (2.5 mg total) by mouth 2 (two) times daily. 04/08/23   Tobie Yetta HERO, MD  ascorbic acid  (VITAMIN C ) 1000 MG tablet Take 1,000 mg by mouth daily.    [provider]  Calcium  Citrate-Vitamin D  (CITRACAL PETITES/VITAMIN D ) 200-6.25 MG-MCG TABS Take 1 tablet by mouth 2 (two) times daily.    [provider]  docusate sodium  (COLACE) 100 MG capsule Take 1 capsule (100 mg total) by mouth 2 (two) times daily. 03/09/23   Rai, Ripudeep MARLA, MD  escitalopram  (LEXAPRO ) 20 MG tablet Take 20 mg by mouth daily.    [provider]  fluticasone  (FLONASE ) 50 MCG/ACT nasal spray Place 2 sprays into both nostrils daily. 02/11/22   Jesus Bernardino MATSU, MD  glucosamine-chondroitin 500-400 MG tablet Take 1 tablet by mouth 2 (two) times daily.    [provider]  Lacosamide  100 MG TABS Take 1 tablet (100 mg total) by mouth in the morning and at  bedtime. 02/01/24   Mast, Man X, NP  lactose free nutrition (BOOST) LIQD Take 237 mLs by mouth daily.    [provider]  levothyroxine  (SYNTHROID ) 75 MCG tablet TAKE 1 TABLET BY MOUTH EVERY DAY BEFORE BREAKFAST 12/12/22   Katrinka Garnette KIDD, MD  loratadine  (CLARITIN ) 10 MG tablet Take 1 tablet (10 mg total) by mouth daily. 02/11/22   Jesus Bernardino MATSU, MD  metoprolol  tartrate (LOPRESSOR ) 25 MG tablet Take 1 tablet (25 mg total) by mouth 2 (two) times daily. 03/09/23   Rai, Nydia MARLA, MD  Multiple Vitamins-Minerals (PRESERVISION AREDS 2 PO) Take 1 capsule by mouth 2 (two) times daily. Reported on 09/04/2015    [provider]  polyethylene glycol (MIRALAX  / GLYCOLAX ) 17 g packet Take 17 g by mouth daily as needed for mild constipation. 03/09/23   Rai, Nydia MARLA, MD  rosuvastatin  (CRESTOR ) 10 MG tablet Take 10 mg by mouth once. 10 mg by mouth in the evening for HLD    [provider]  Saline (SIMPLY SALINE) 0.9 % AERS Place 2 each into the nose daily as needed. 02/11/22   Jesus Bernardino MATSU, MD  TYLENOL  8 HOUR ARTHRITIS PAIN 650 MG CR tablet Take 650 mg by mouth daily.    [provider]  vitamin A 10000 UNIT capsule Take 1 capsule by mouth daily.    [provider]  zinc  oxide 20 % ointment Apply 1 Application topically  See admin instructions. Apply topically to peri/buttocks as needed after each incontinent episode for skin protection.    [provider]    Allergies: Tape and Vioxx [rofecoxib]    Review of Systems  All other systems reviewed and are negative.   Updated Vital Signs BP (!) 180/90 (BP Location: Right Arm)   Pulse 91   Temp 98.6 F (37 C) (Oral)   Resp 19   Ht 5' (1.524 m)   Wt 49 kg   LMP  (LMP Unknown)   SpO2 96%   BMI 21.10 kg/m   Physical Exam Vitals and nursing note reviewed.  Constitutional:      General: She is not in acute distress.    Appearance: Normal appearance. She is well-developed.  HENT:     Head:  Normocephalic and atraumatic.     Mouth/Throat:     Comments: No injury to tongue Eyes:     Conjunctiva/sclera: Conjunctivae normal.     Pupils: Pupils are equal, round, and reactive to light.  Neck:     Comments: Cervical collar in place Cardiovascular:     Rate and Rhythm: Normal rate and regular rhythm.     Heart sounds: Normal heart sounds.  Pulmonary:     Effort: Pulmonary effort is normal. No respiratory distress.     Breath sounds: Normal breath sounds.  Abdominal:     General: There is no distension.     Palpations: Abdomen is soft.     Tenderness: There is no abdominal tenderness.  Genitourinary:    Comments: No obvious incontinence of urine Musculoskeletal:        General: No deformity. Normal range of motion.     Comments: Contusion and abrasion at least 6 to 45 days old on the left lateral elbow.  Skin:    General: Skin is warm and dry.  Neurological:     General: No focal deficit present.     Mental Status: She is alert.     Comments: Alert, oriented to person.  No focal weakness.  Normal speech.  No facial droop.     (all labs ordered are listed, but only abnormal results are displayed) Labs Reviewed  CBC WITH DIFFERENTIAL/PLATELET  BASIC METABOLIC PANEL WITH GFR  I-STAT CHEM 8, ED  TYPE AND SCREEN    EKG: None  Radiology: No results found.   Procedures   Medications Ordered in the ED - No data to display                                  Medical Decision Making Patient presents after fall from standing while at her facility.  She is without clear evidence of significant traumatic injury on exam.  She is on Eliquis .  Obtained imaging is reassuringly without evidence of acute injury or fracture.  Patient is noted to have some mild word finding difficulty.  This appears to be new since her POA last saw her 3 weeks ago.  Patient is otherwise neurologically intact.  The word finding difficulty is intermittent.  Patient has capacity fused care.   Patient offered MRI and/or admission for further workup of possible stroke.  Patient declines this workup.  She does not want to stay for an MRI.  She wants to be discharged.  Described symptoms are not consistent with likely seizure.  Patient is compliant with previously prescribed antiepileptics.  Importance of close follow-up is stressed.  Strict return  precautions given both the patient and patient's POA Jessie Sharps) who is present at bedside.  Prior to discharge, patient is ambulating with her walker at baseline.  Patient reports that she feels quite comfortable with tested ambulation.  Amount and/or Complexity of Data Reviewed Labs: ordered. Radiology: ordered.        Final diagnoses:  Fall, initial encounter    ED Discharge Orders     None          Laurice Maude BROCKS, MD 02/15/24 1249

## 2024-02-15 NOTE — Discharge Instructions (Signed)
 Return for any problem.  ?

## 2024-02-15 NOTE — ED Notes (Signed)
 POA Rosaline Sharps set up ride home with [facility].

## 2024-02-15 NOTE — ED Triage Notes (Signed)
 Pt to ED via EMS from SNF with c/o witnessed fall this am. EMS states facility reported seizure-like activity for 15 seconds after the fall. Hx seizures. Pt A&Ox1. Pt arrives in c-collar. +blood thinners. Pt maintaining own airway and secretions. Pt breathing even and unlabored. No uncontrolled bleeding noted. Abrasion and swelling noted to left elbow. PERRL.   180/90 manual BP

## 2024-02-16 ENCOUNTER — Encounter: Payer: Self-pay | Admitting: Nurse Practitioner

## 2024-02-16 ENCOUNTER — Non-Acute Institutional Stay: Payer: Self-pay | Admitting: Nurse Practitioner

## 2024-02-16 DIAGNOSIS — E034 Atrophy of thyroid (acquired): Secondary | ICD-10-CM

## 2024-02-16 DIAGNOSIS — N183 Chronic kidney disease, stage 3 unspecified: Secondary | ICD-10-CM | POA: Diagnosis not present

## 2024-02-16 DIAGNOSIS — K5901 Slow transit constipation: Secondary | ICD-10-CM | POA: Insufficient documentation

## 2024-02-16 DIAGNOSIS — R296 Repeated falls: Secondary | ICD-10-CM | POA: Diagnosis not present

## 2024-02-16 DIAGNOSIS — M6281 Muscle weakness (generalized): Secondary | ICD-10-CM | POA: Diagnosis not present

## 2024-02-16 DIAGNOSIS — Z9181 History of falling: Secondary | ICD-10-CM | POA: Diagnosis not present

## 2024-02-16 DIAGNOSIS — I480 Paroxysmal atrial fibrillation: Secondary | ICD-10-CM

## 2024-02-16 DIAGNOSIS — R41841 Cognitive communication deficit: Secondary | ICD-10-CM | POA: Diagnosis not present

## 2024-02-16 DIAGNOSIS — F3342 Major depressive disorder, recurrent, in full remission: Secondary | ICD-10-CM

## 2024-02-16 DIAGNOSIS — R2681 Unsteadiness on feet: Secondary | ICD-10-CM | POA: Diagnosis not present

## 2024-02-16 DIAGNOSIS — R569 Unspecified convulsions: Secondary | ICD-10-CM | POA: Insufficient documentation

## 2024-02-16 DIAGNOSIS — I10 Essential (primary) hypertension: Secondary | ICD-10-CM

## 2024-02-16 DIAGNOSIS — G40909 Epilepsy, unspecified, not intractable, without status epilepticus: Secondary | ICD-10-CM | POA: Insufficient documentation

## 2024-02-16 NOTE — Assessment & Plan Note (Signed)
 Last BM today, stable, taking MiraLAX , Colace

## 2024-02-16 NOTE — Assessment & Plan Note (Signed)
 Her mood is stable, will reduce Lexapro  15/20mg  to reduce possible contribution to fall or interaction with lacosamide , Na 140 02/15/24, observed

## 2024-02-16 NOTE — Progress Notes (Signed)
 Location:   [facility] Room Number: [id number] Place of Service:  [facility] [phone]) Provider: Larwance Alveena Taira NP  Charlanne Fredia CROME, MD  Patient Care Team: Charlanne Fredia CROME, MD as PCP - General (Internal Medicine) Delford Maude BROCKS, MD as PCP - Cardiology (Cardiology) Rosan Credit, MD as Consulting Physician (Ophthalmology) Sharalyn, Janina GRADE, MD as Referring Physician (Ophthalmology) Buck Saucer, MD as Consulting Physician (Neurology) Charlanne Fredia CROME, MD as Consulting Physician (Internal Medicine)  Extended Emergency Contact Information Primary Emergency Contact: Rudi JANEANE Lesches Work Phone: [phone] Mobile Phone: [phone] Relation: Other Secondary Emergency Contact: Heyge,Lorna Mobile Phone: [phone] Relation: Friend Interpreter needed? No  Code Status:  DNR Goals of care: Advanced Directive information    02/16/2024   11:29 AM  Advanced Directives  Does Patient Have a Medical Advance Directive? Yes  Type of Estate Agent of [place];Living will;Out of facility DNR (pink MOST or yellow form)  Does patient want to make changes to medical advance directive? No - Patient declined  Copy of Healthcare Power of Attorney in Chart? Yes - validated most recent copy scanned in chart (See row information)     Chief Complaint  Patient presents with   Acute Visit    Follow-up ED visit 02/15/2024    HPI:  Pt is a 89 y.o. female seen today for an acute visit for ED eval for fall and altered mental status, BMP/CBC, CT head/cervical spine, chest x-ray, x-ray of left elbow, x-ray of pelvis are unremarkable. Declined MRI   Fall: a witnessed fall with brief episode of shaking, last about 15 secs after fall.   The patient is in her usual state of health today upon my visit, answered questions appropriately, she denied headache, dizziness, change of vision, trouble swallowing, chest pain/palpitation, cough, shortness of breath, nausea, vomiting, or ABD pain.   She admitted a slight burning sensation upon urination, but no increased frequency or incontinence.  Last bowel movement was this morning.  The patient stated that she slept well last night and had breakfast.  No O2 desaturation, she is afebrile.  No noted neurological deficit.  Frequent falls, forgets using walking, working with therapy.   Hx of CVA, CT head 02/15/24, 1. No acute intracranial abnormality. 2. Right parietal scalp soft tissue swelling. 3. Chronic ischemia with multiple old infarcts.  Followed by Neurology  Afib, heart rate is in control, taking metoprolol , Eliquis   Seizures, taking lacosamide , followed by neurology  HLD, taking Crestor   CKD, Bun/creat 24/1.3 02/15/24, 24/0.95 07/28/23  Depression, on Lexapro  20mg , Na 140 02/15/24  Hypothyroidism, taking levothyroxine , TSH 1.04 04/08/23  Constipation, stable, taking MiraLAX , Colace  HTN, intermittent elevated, asymptomatic, on Metoprolol .         Past Medical History:  Diagnosis Date   Atrial fibrillation (HCC)    AFIB   Collagenous colitis    CVA (cerebral vascular accident) (HCC)    a.  L parietal CVA by MRI 12/12, likely embolic;  b.  TEE by report with neg bubble study;  c.  carotid dopplers  12/12:  + plaque, no sig ICA stenosis    History of postmenopausal HRT    Hx of adenomatous colonic polyps    Hypothyroidism    MVP (mitral valve prolapse)    a. s/p MV repair at [facility] in 2008;  b. Echocardiogram 7/12: EF 50%, status post mitral valve repair with mild MR and minimal MS, mean gradient 4, moderate LAE, LA diam 55 mm; mild RAE, PASP 28-32;  Osteoarthritis    Osteoporosis    Past Surgical History:  Procedure Laterality Date   ANTERIOR APPROACH HEMI HIP ARTHROPLASTY Right 03/05/2023   Procedure: ANTERIOR APPROACH HEMI HIP ARTHROPLASTY;  Surgeon: Fidel Rogue, MD;  Location: [facility];  Service: Orthopedics;  Laterality: Right;   CARDIOVERSION  01/09/2012   Procedure: CARDIOVERSION;  Surgeon: Toribio JONELLE Fuel,  MD;  Location: [facility] ENDOSCOPY;  Service: Cardiovascular;  Laterality: N/A;   CATARACT EXTRACTION     CHOLECYSTECTOMY     DILATION AND CURETTAGE OF UTERUS     LUMBAR FUSION     MITRAL VALVE REPAIR  03/17/2006   mitral valve repair CE ring and maze procedure   TONSILLECTOMY      Allergies  Allergen Reactions   Tape Other (See Comments)    Skin tears and bruises easily.   Vioxx [Rofecoxib] Other (See Comments)    Elevated LFT's    Allergies as of 02/16/2024       Reactions   Tape Other (See Comments)   Skin tears and bruises easily.   Vioxx [rofecoxib] Other (See Comments)   Elevated LFT's        Medication List        Accurate as of February 16, 2024  2:54 PM. If you have any questions, ask your nurse or doctor.          amoxicillin  500 MG capsule Commonly known as: AMOXIL  Take 2,000 mg by mouth as needed (dental Appointment). Give 2000 mg by mouth as needed for dental appointments AM of dental appointments   ascorbic acid  1000 MG tablet Commonly known as: VITAMIN C  Take 1,000 mg by mouth daily.   Citracal Petites/Vitamin D  200-6.25 MG-MCG Tabs Generic drug: Calcium  Citrate-Vitamin D  Take 1 tablet by mouth 2 (two) times daily.   docusate sodium  100 MG capsule Commonly known as: COLACE Take 1 capsule (100 mg total) by mouth 2 (two) times daily.   Eliquis  2.5 MG Tabs tablet Generic drug: apixaban  Take 1 tablet (2.5 mg total) by mouth 2 (two) times daily.   escitalopram  20 MG tablet Commonly known as: LEXAPRO  Take 20 mg by mouth daily.   fluticasone  50 MCG/ACT nasal spray Commonly known as: FLONASE  Place 2 sprays into both nostrils daily.   glucosamine-chondroitin 500-400 MG tablet Take 1 tablet by mouth 2 (two) times daily.   Lacosamide  100 MG Tabs Take 1 tablet (100 mg total) by mouth in the morning and at bedtime.   lactose free nutrition Liqd Take 237 mLs by mouth daily.   levothyroxine  75 MCG tablet Commonly known as: SYNTHROID  TAKE 1 TABLET  BY MOUTH EVERY DAY BEFORE BREAKFAST   loratadine  10 MG tablet Commonly known as: CLARITIN  Take 1 tablet (10 mg total) by mouth daily.   metoprolol  tartrate 25 MG tablet Commonly known as: LOPRESSOR  Take 1 tablet (25 mg total) by mouth 2 (two) times daily.   polyethylene glycol 17 g packet Commonly known as: MIRALAX  / GLYCOLAX  Take 17 g by mouth daily as needed for mild constipation.   PRESERVISION AREDS 2 PO Take 1 capsule by mouth 2 (two) times daily. Reported on 09/04/2015   rosuvastatin  10 MG tablet Commonly known as: CRESTOR  Take 10 mg by mouth once. 10 mg by mouth in the evening for HLD   Simply Saline 0.9 % Aers Generic drug: Saline Place 2 each into the nose daily as needed.   Tylenol  8 Hour Arthritis Pain 650 MG CR tablet Generic drug: acetaminophen  Take 650 mg by mouth  daily.   vitamin A 10000 UNIT capsule Take 1 capsule by mouth daily.   zinc  oxide 20 % ointment Apply 1 Application topically See admin instructions. Apply topically to peri/buttocks as needed after each incontinent episode for skin protection.        Review of Systems  Constitutional:  Positive for fatigue. Negative for appetite change and fever.  HENT:  Negative for congestion and trouble swallowing.   Eyes:  Positive for visual disturbance.  Respiratory:  Negative for cough.   Cardiovascular:  Negative for chest pain, palpitations and leg swelling.  Gastrointestinal:  Negative for abdominal pain, constipation, nausea and vomiting.  Genitourinary:  Positive for dysuria. Negative for frequency and urgency.  Musculoskeletal:  Positive for arthralgias.  Skin:  Negative for color change.  Neurological:  Positive for seizures. Negative for dizziness, speech difficulty, weakness and headaches.  Psychiatric/Behavioral:  Negative for confusion and sleep disturbance. The patient is not nervous/anxious.     Immunization History  Administered Date(s) Administered   Fluad Quad(high Dose 65+)  11/30/2018, 01/17/2020   Fluad Trivalent(High Dose 65+) 03/02/2023   INFLUENZA, HIGH DOSE SEASONAL PF 12/22/2012, 03/06/2015, 11/26/2015, 12/09/2017, 11/17/2022   Influenza Split 12/17/2010, 12/02/2011   Influenza Whole 03/17/2001, 12/18/2006, 12/28/2007, 01/03/2009, 12/26/2009   Influenza,inj,Quad PF,6+ Mos 11/23/2013   Influenza-Unspecified 11/20/2016   PFIZER(Purple Top)SARS-COV-2 Vaccination 06/09/2019, 06/30/2019, 02/16/2020   Pneumococcal Conjugate-13 03/02/2014   Pneumococcal Polysaccharide-23 03/17/2000, 09/16/2006   Td 03/18/1995, 09/15/2006, 02/22/2021   Tdap 12/17/2010   Unspecified SARS-COV-2 Vaccination 12/08/2020, 01/12/2024   Zoster Recombinant(Shingrix) 09/18/2016, 11/20/2016   Pertinent  Health Maintenance Due  Topic Date Due   Influenza Vaccine  10/16/2023   Bone Density Scan  Completed      02/11/2022   11:26 AM 05/26/2022   11:09 AM 03/02/2023    3:17 PM 05/18/2023   11:29 AM 11/19/2023    6:09 PM  Fall Risk  Falls in the past year? 1 1 1 1 1   Was there an injury with Fall? 1  1  0  1  1   Was there an injury with Fall? - Comments fractured rib       Fall Risk Category Calculator 3 3 2 2 3   Fall Risk Category (Retired) High       (RETIRED) Patient Fall Risk Level Low fall risk       Patient at Risk for Falls Due to No Fall Risks History of fall(s) Impaired balance/gait;Impaired mobility;History of fall(s) History of fall(s);Impaired balance/gait;Impaired mobility History of fall(s);Impaired balance/gait;Impaired mobility  Fall risk Follow up Falls evaluation completed  Falls evaluation completed Falls prevention discussed Falls evaluation completed;Education provided Falls evaluation completed;Education provided     Data saved with a previous flowsheet row definition   Functional Status Survey:    Vitals:   02/16/24 1045  BP: (!) 140/82  Pulse: 79  Resp: 17  Temp: (!) 96.9 F (36.1 C)  SpO2: 97%  Weight: 112 lb (50.8 kg)   Body mass index is 21.87  kg/m. Physical Exam Constitutional:      Appearance: Normal appearance.  HENT:     Head: Normocephalic and atraumatic.     Nose: Nose normal.     Mouth/Throat:     Mouth: Mucous membranes are dry.  Eyes:     Extraocular Movements: Extraocular movements intact.     Conjunctiva/sclera: Conjunctivae normal.     Pupils: Pupils are equal, round, and reactive to light.  Cardiovascular:     Rate and Rhythm: Normal rate.  Rhythm irregular.     Heart sounds: No murmur heard. Pulmonary:     Breath sounds: No wheezing or rales.  Abdominal:     General: Bowel sounds are normal.     Palpations: Abdomen is soft.     Tenderness: There is no abdominal tenderness. There is no right CVA tenderness, left CVA tenderness, guarding or rebound.  Musculoskeletal:        General: No tenderness.     Right lower leg: No edema.     Left lower leg: No edema.  Skin:    General: Skin is warm and dry.  Neurological:     General: No focal deficit present.     Mental Status: She is alert and oriented to person, place, and time. Mental status is at baseline.     Motor: No weakness.     Gait: Gait abnormal.  Psychiatric:        Mood and Affect: Mood normal.        Behavior: Behavior normal.        Thought Content: Thought content normal.        Judgment: Judgment normal.     Labs reviewed: Recent Labs    03/06/23 0337 03/07/23 0352 03/08/23 0352 04/06/23 1326 04/08/23 0439 05/14/23 0948 05/14/23 0954 07/28/23 0847 02/15/24 0959 02/15/24 1026  NA 130* 133* 139   < >  --  139   < > 135 139 140  K 4.0 4.6 4.4   < >  --  4.3   < > 4.2 4.4 4.4  CL 97* 98 103   < >  --  102   < > 104 103 104  CO2 26 29 30    < >  --  26  --  25 28  --   GLUCOSE 154* 131* 95   < >  --  110*   < > 93 126* 126*  BUN 25* 34* 27*   < >  --  21   < > 24* 24* 24*  CREATININE 0.93 0.89 0.64   < >  --  0.93   < > 0.95 1.15* 1.30*  CALCIUM  7.7* 8.5* 8.6*   < >  --  9.3  --  8.5* 9.3  --   MG  --  2.1 2.2  --  2.0  --   --    --   --   --   PHOS 3.1 1.6* 3.1  --   --   --   --   --   --   --    < > = values in this interval not displayed.   Recent Labs    03/03/23 2200 03/05/23 0748 03/08/23 0352 04/06/23 1326 05/14/23 0948  AST 29  --   --  33 29  ALT 42  --   --  28 25  ALKPHOS 81  --   --  159* 127*  BILITOT 0.7  --   --  0.7 0.9  PROT 5.9*  --   --  6.2* 6.5  ALBUMIN 3.6   < > 2.8* 3.6 3.8   < > = values in this interval not displayed.   Recent Labs    04/06/23 1326 05/14/23 0948 05/14/23 0954 07/28/23 0847 02/15/24 0959 02/15/24 1026  WBC 10.5 9.3  --  6.9 9.6  --   NEUTROABS 8.0* 7.2  --   --  6.9  --   HGB 11.9* 12.7   < > 12.1  13.4 13.9  HCT 37.9 39.8   < > 38.4 41.6 41.0  MCV 101.6* 97.8  --  94.6 96.5  --   PLT 191 173  --  173 188  --    < > = values in this interval not displayed.   Lab Results  Component Value Date   TSH 1.862 04/08/2023   Lab Results  Component Value Date   HGBA1C 5.5 03/06/2023   Lab Results  Component Value Date   CHOL 132 04/07/2023   HDL 66 04/07/2023   LDLCALC 57 04/07/2023   LDLDIRECT 47.0 09/04/2015   TRIG 43 04/07/2023   CHOLHDL 2.0 04/07/2023    Significant Diagnostic Results in last 30 days:  CT Head Wo Contrast Result Date: 02/15/2024 EXAM: CT HEAD WITHOUT 02/15/2024 10:36:19 AM TECHNIQUE: CT of the head was performed without the administration of intravenous contrast. Automated exposure control, iterative reconstruction, and/or weight based adjustment of the mA/kV was utilized to reduce the radiation dose to as low as reasonably achievable. COMPARISON: Head CT 07/28/2023 and MRI 05/14/2023. CLINICAL HISTORY: Head trauma, minor (Age >= 65y). FINDINGS: BRAIN AND VENTRICLES: There is no evidence of an acute infarct, intracranial hemorrhage, mass, midline shift, hydrocephalus, or extra-axial fluid collection. A small to moderate sized chronic left MCA infarct is again noted. Patchy hypodensities elsewhere in the cerebral white matter bilaterally  are similar to the prior CT and are nonspecific but compatible with mild chronic small vessel ischemic disease. Chronic lacunar infarcts are again noted bilaterally in the cerebellum and basal ganglia. There is mild cerebral atrophy. Calcified atherosclerosis at the skull base. ORBITS: Bilateral cataract extraction. SINUSES AND MASTOIDS: No acute abnormality. SOFT TISSUES AND SKULL: No acute skull fracture. Right parietal scalp soft tissue swelling. IMPRESSION: 1. No acute intracranial abnormality. 2. Right parietal scalp soft tissue swelling. 3. Chronic ischemia with multiple old infarcts. Electronically signed by: Dasie Hamburg MD 02/15/2024 11:07 AM EST RP Workstation: [id number]   DG Elbow 2 Views Left Result Date: 02/15/2024 CLINICAL DATA:  Fall. EXAM: LEFT ELBOW - 2 VIEW COMPARISON:  None Available. FINDINGS: Bony overgrowth involving the elbow joint, especially along the radial head. This makes assessment for subtle radial neck fracture challenging. There does appear to be a joint effusion which may be degenerative in etiology. IMPRESSION: Degenerative changes in the elbow joint with bony overgrowth involving the radial head, making assessment for a subtle radial neck fracture challenging. Probable degenerative joint effusion. If further evaluation is desired, CT is recommended. Electronically Signed   By: Newell Eke M.D.   On: 02/15/2024 10:50   CT Cervical Spine Wo Contrast Result Date: 02/15/2024 CLINICAL DATA:  Neck trauma (Age >= 65y) Witnessed fall this morning. EXAM: CT CERVICAL SPINE WITHOUT CONTRAST TECHNIQUE: Multidetector CT imaging of the cervical spine was performed without intravenous contrast. Multiplanar CT image reconstructions were also generated. RADIATION DOSE REDUCTION: This exam was performed according to the departmental dose-optimization program which includes automated exposure control, adjustment of the mA and/or kV according to patient size and/or use of iterative  reconstruction technique. COMPARISON:  Limited correlation made with thoracic spine radiographs 01/08/2022. FINDINGS: Alignment: Reversal of the usual cervical lordosis with a fused anterolisthesis at C4-5. Additional degenerative anterolisthesis at C3-4 and C7-T1. Skull base and vertebrae: No evidence of acute fracture or traumatic subluxation. Multilevel spondylosis. There is interbody ankylosis at C4-5 and C5-6 and interfacetal ankylosis bilaterally at C4-5. Multilevel facet arthropathy. Soft tissues and spinal canal: No prevertebral fluid or swelling. No visible canal hematoma.  Disc levels: Multilevel spondylosis with disc space narrowing, uncinate spurring and facet hypertrophy. Up to moderate resulting spinal stenosis at C6-7. Multilevel mild-to-moderate osseous foraminal narrowing. Upper chest: Biapical scarring. Other: Bilateral carotid atherosclerosis. IMPRESSION: 1. No evidence of acute cervical spine fracture, traumatic subluxation or static signs of instability. 2. Multilevel cervical spondylosis as described with up to moderate spinal stenosis at C6-7. Electronically Signed   By: Elsie Perone M.D.   On: 02/15/2024 10:48   DG Chest Portable 1 View Result Date: 02/15/2024 CLINICAL DATA:  Fall EXAM: PORTABLE CHEST 1 VIEW COMPARISON:  Chest radiograph dated 03/03/2023 FINDINGS: Low lung volumes with bronchovascular crowding. Bilateral lower lung patchy opacities. No pleural effusion or pneumothorax. Enlarged cardiomediastinal silhouette. No radiographic finding of acute displaced fracture. IMPRESSION: 1. Low lung volumes with bronchovascular crowding. Bilateral lower lung patchy opacities, likely atelectasis. Aspiration or pneumonia can be considered in the appropriate clinical setting. 2. Enlarged cardiomediastinal silhouette. Electronically Signed   By: Limin  Xu M.D.   On: 02/15/2024 10:48   DG Pelvis Portable Result Date: 02/15/2024 CLINICAL DATA:  Fall. EXAM: PORTABLE PELVIS 1-2 VIEWS  COMPARISON:  None Available. FINDINGS: Right hip arthroplasty. No fracture or dislocation. Sacroiliac joints are patent. Partially imaged lumbar fusion hardware. IMPRESSION: No acute findings. Electronically Signed   By: Newell Eke M.D.   On: 02/15/2024 10:47    Assessment/Plan Falls frequently TIA vs active seizure vs cardiac etiology? Will continue therapy Will decrease Lexapro  15mg /20mg  to decreased possible contributory factors of falling or interaction with Lacosamide , based on her stable mood presently. Observe.  Will f/u BMP one week, encourage oral fluid intake, Bun/creat trended up gradually.  Will obtain UA C/S Will VS, neuro check q shift x72hrs.   Paroxysmal atrial fibrillation (HCC) heart rate is in control, taking metoprolol , Eliquis   Seizure (HCC) taking lacosamide , followed by neurology Shaking after fall? F/u neurology  CKD (chronic kidney disease), stage III (HCC) Bun/creat 24/1.3 02/15/24, 24/0.95 07/28/23, trended up gradually Encourage oral fluid intake Follow-up with BMP 1 week  Major depression in full remission (HCC) Her mood is stable, will reduce Lexapro  15/20mg  to reduce possible contribution to fall or interaction with lacosamide , Na 140 02/15/24, observed  Hypothyroidism  taking levothyroxine , TSH 1.04 04/08/23  Slow transit constipation Last BM today, stable, taking MiraLAX , Colace  Hypertension Permissive Bp control, intermittent elevated, asymptomatic, on Metoprolol .     Family/ staff Communication: Plan of care reviewed with the patient and the charge nurse  Labs/tests ordered: UA C/S, BMP

## 2024-02-16 NOTE — Assessment & Plan Note (Signed)
 taking levothyroxine , TSH 1.04 04/08/23

## 2024-02-16 NOTE — Assessment & Plan Note (Signed)
 Bun/creat 24/1.3 02/15/24, 24/0.95 07/28/23, trended up gradually Encourage oral fluid intake Follow-up with BMP 1 week

## 2024-02-16 NOTE — Assessment & Plan Note (Signed)
 TIA vs active seizure vs cardiac etiology? Will continue therapy Will decrease Lexapro  15mg /20mg  to decreased possible contributory factors of falling or interaction with Lacosamide , based on her stable mood presently. Observe.  Will f/u BMP one week, encourage oral fluid intake, Bun/creat trended up gradually.  Will obtain UA C/S Will VS, neuro check q shift x72hrs.

## 2024-02-16 NOTE — Assessment & Plan Note (Signed)
 taking lacosamide , followed by neurology Shaking after fall? F/u neurology

## 2024-02-16 NOTE — Assessment & Plan Note (Signed)
 Permissive Bp control, intermittent elevated, asymptomatic, on Metoprolol .

## 2024-02-16 NOTE — Assessment & Plan Note (Signed)
 heart rate is in control, taking metoprolol , Eliquis 

## 2024-02-17 DIAGNOSIS — M6281 Muscle weakness (generalized): Secondary | ICD-10-CM | POA: Diagnosis not present

## 2024-02-17 DIAGNOSIS — R41841 Cognitive communication deficit: Secondary | ICD-10-CM | POA: Diagnosis not present

## 2024-02-17 DIAGNOSIS — H34831 Tributary (branch) retinal vein occlusion, right eye, with macular edema: Secondary | ICD-10-CM | POA: Diagnosis not present

## 2024-02-17 DIAGNOSIS — Z9181 History of falling: Secondary | ICD-10-CM | POA: Diagnosis not present

## 2024-02-17 DIAGNOSIS — R2681 Unsteadiness on feet: Secondary | ICD-10-CM | POA: Diagnosis not present

## 2024-02-17 DIAGNOSIS — N390 Urinary tract infection, site not specified: Secondary | ICD-10-CM | POA: Diagnosis not present

## 2024-02-18 ENCOUNTER — Telehealth: Payer: Self-pay | Admitting: Neurology

## 2024-02-18 DIAGNOSIS — Z9181 History of falling: Secondary | ICD-10-CM | POA: Diagnosis not present

## 2024-02-18 DIAGNOSIS — M6281 Muscle weakness (generalized): Secondary | ICD-10-CM | POA: Diagnosis not present

## 2024-02-18 DIAGNOSIS — R2681 Unsteadiness on feet: Secondary | ICD-10-CM | POA: Diagnosis not present

## 2024-02-18 NOTE — Telephone Encounter (Signed)
 Please advise caller that if there is acute concern for seizure-like activity, patient has to be sent to the emergency room.

## 2024-02-18 NOTE — Telephone Encounter (Signed)
 I did see the emergency room records from 02/15/2024 and she declined further workup/admission at the time.  If there are new or acute concerns that are changed since then, I recommend she go to the emergency room.  She can schedule a follow-up with me or nurse practitioner (already scheduled).

## 2024-02-18 NOTE — Telephone Encounter (Signed)
 Called Katie/[facility] at [phone]. Spoke w/ Ronal who transferred me to [facility]. She states pt has had no more seizure activity. Doing a lot of word searching, a little confused while speaking. However, she has improved since Monday and continues to improve. Relayed they should bring her back to ED if she develops any more seizure activity. Pt currently scheduled for appt with Dr. Buck 03/25/24 at 9:45am. I added her to wait list. Advised they can also call to check on cx before then as well. She verbalized understanding.

## 2024-02-18 NOTE — Telephone Encounter (Signed)
 Katie from [facility] reports that Dr Charlanne has asked that pt be scheduled due to seizure like activity, word finding difficulty and a hit on the back of the head all on 12/1.  Katie states Dr Charlanne would like to know if it would be suggested there be an increase to Lacosamide , please call.  Appointment scheduled with MD because NP is scheduling months in advance.

## 2024-02-19 DIAGNOSIS — Z9181 History of falling: Secondary | ICD-10-CM | POA: Diagnosis not present

## 2024-02-19 DIAGNOSIS — M6281 Muscle weakness (generalized): Secondary | ICD-10-CM | POA: Diagnosis not present

## 2024-02-19 DIAGNOSIS — R41841 Cognitive communication deficit: Secondary | ICD-10-CM | POA: Diagnosis not present

## 2024-02-19 DIAGNOSIS — R2681 Unsteadiness on feet: Secondary | ICD-10-CM | POA: Diagnosis not present

## 2024-02-22 DIAGNOSIS — I10 Essential (primary) hypertension: Secondary | ICD-10-CM | POA: Diagnosis not present

## 2024-03-02 ENCOUNTER — Other Ambulatory Visit: Payer: Self-pay | Admitting: Adult Health

## 2024-03-02 DIAGNOSIS — R569 Unspecified convulsions: Secondary | ICD-10-CM

## 2024-03-02 MED ORDER — LACOSAMIDE 100 MG PO TABS: 1.0000 | ORAL_TABLET | Freq: Two times a day (BID) | ORAL | 0 refills | Status: DC

## 2024-03-15 ENCOUNTER — Emergency Department (HOSPITAL_COMMUNITY)

## 2024-03-15 ENCOUNTER — Emergency Department (HOSPITAL_COMMUNITY)
Admission: EM | Admit: 2024-03-15 | Discharge: 2024-03-15 | Disposition: A | Discharge status: Skilled Nursing Facility | Attending: Emergency Medicine | Admitting: Emergency Medicine

## 2024-03-15 DIAGNOSIS — I4891 Unspecified atrial fibrillation: Secondary | ICD-10-CM | POA: Insufficient documentation

## 2024-03-15 DIAGNOSIS — W0110XA Fall on same level from slipping, tripping and stumbling with subsequent striking against unspecified object, initial encounter: Secondary | ICD-10-CM | POA: Diagnosis not present

## 2024-03-15 DIAGNOSIS — S0990XA Unspecified injury of head, initial encounter: Secondary | ICD-10-CM | POA: Diagnosis present

## 2024-03-15 DIAGNOSIS — E039 Hypothyroidism, unspecified: Secondary | ICD-10-CM | POA: Insufficient documentation

## 2024-03-15 DIAGNOSIS — S0003XA Contusion of scalp, initial encounter: Secondary | ICD-10-CM | POA: Insufficient documentation

## 2024-03-15 DIAGNOSIS — Y92002 Bathroom of unspecified non-institutional (private) residence single-family (private) house as the place of occurrence of the external cause: Secondary | ICD-10-CM | POA: Diagnosis not present

## 2024-03-15 DIAGNOSIS — W19XXXA Unspecified fall, initial encounter: Secondary | ICD-10-CM

## 2024-03-15 DIAGNOSIS — Z8673 Personal history of transient ischemic attack (TIA), and cerebral infarction without residual deficits: Secondary | ICD-10-CM | POA: Insufficient documentation

## 2024-03-15 DIAGNOSIS — Z7901 Long term (current) use of anticoagulants: Secondary | ICD-10-CM | POA: Diagnosis not present

## 2024-03-15 DIAGNOSIS — Z79890 Hormone replacement therapy (postmenopausal): Secondary | ICD-10-CM | POA: Insufficient documentation

## 2024-03-15 NOTE — ED Provider Notes (Signed)
 " [facility] Provider Note   CSN: [id number] Arrival date & time: 03/15/24  1055     Patient presents with: Theresa [name] is a 89 y.o. female.   Patient is a 89 year old female with a history of atrial fibrillation on Eliquis , CVA and hypothyroidism who is presenting today as a level 2 trauma.  Patient reports she was in her bathroom and she slipped on a towel falling hitting the right side of her head on the floor.  She reports initially she was not able to get up but then someone came and helped her up and she was able to ambulate.  She denies any pain in her arms or legs.  She denies significant headache.  No new vision changes or dizziness.  She denies any nausea or vomiting.  She did last take her Eliquis  this morning and her morning medications.  She denies any neck pain.  She otherwise had been feeling her normal self today.  The history is provided by the patient, medical records and the EMS personnel.  Fall       Prior to Admission medications  Medication Sig Start Date End Date Taking? Authorizing Provider  amoxicillin  (AMOXIL ) 500 MG capsule Take 2,000 mg by mouth as needed (dental Appointment). Give 2000 mg by mouth as needed for dental appointments AM of dental appointments    [provider]  apixaban  (ELIQUIS ) 2.5 MG TABS tablet Take 1 tablet (2.5 mg total) by mouth 2 (two) times daily. 04/08/23   Tobie Yetta HERO, MD  ascorbic acid  (VITAMIN C ) 1000 MG tablet Take 1,000 mg by mouth daily.    [provider]  Calcium  Citrate-Vitamin D  (CITRACAL PETITES/VITAMIN D ) 200-6.25 MG-MCG TABS Take 1 tablet by mouth 2 (two) times daily.    [provider]  docusate sodium  (COLACE) 100 MG capsule Take 1 capsule (100 mg total) by mouth 2 (two) times daily. 03/09/23   Rai, Nydia POUR, MD  escitalopram  (LEXAPRO ) 20 MG tablet Take 20 mg by mouth daily.    [provider]  fluticasone  (FLONASE ) 50  MCG/ACT nasal spray Place 2 sprays into both nostrils daily. 02/11/22   Jesus Bernardino MATSU, MD  glucosamine-chondroitin 500-400 MG tablet Take 1 tablet by mouth 2 (two) times daily.    [provider]  Lacosamide  100 MG TABS Take 1 tablet (100 mg total) by mouth in the morning and at bedtime. 03/02/24   Medina-Vargas, Monina C, NP  lactose free nutrition (BOOST) LIQD Take 237 mLs by mouth daily.    [provider]  levothyroxine  (SYNTHROID ) 75 MCG tablet TAKE 1 TABLET BY MOUTH EVERY DAY BEFORE BREAKFAST 12/12/22   Katrinka Garnette KIDD, MD  loratadine  (CLARITIN ) 10 MG tablet Take 1 tablet (10 mg total) by mouth daily. 02/11/22   Jesus Bernardino MATSU, MD  metoprolol  tartrate (LOPRESSOR ) 25 MG tablet Take 1 tablet (25 mg total) by mouth 2 (two) times daily. 03/09/23   Rai, Nydia POUR, MD  Multiple Vitamins-Minerals (PRESERVISION AREDS 2 PO) Take 1 capsule by mouth 2 (two) times daily. Reported on 09/04/2015    [provider]  polyethylene glycol (MIRALAX  / GLYCOLAX ) 17 g packet Take 17 g by mouth daily as needed for mild constipation. 03/09/23   Rai, Nydia POUR, MD  rosuvastatin  (CRESTOR ) 10 MG tablet Take 10 mg by mouth once. 10 mg by mouth in the evening for HLD    [provider]  Saline (SIMPLY SALINE) 0.9 %  AERS Place 2 each into the nose daily as needed. 02/11/22   Jesus Bernardino MATSU, MD  TYLENOL  8 HOUR ARTHRITIS PAIN 650 MG CR tablet Take 650 mg by mouth daily.    [provider]  vitamin A 10000 UNIT capsule Take 1 capsule by mouth daily.    [provider]  zinc  oxide 20 % ointment Apply 1 Application topically See admin instructions. Apply topically to peri/buttocks as needed after each incontinent episode for skin protection.    [provider]    Allergies: Tape and Vioxx [rofecoxib]    Review of Systems  Updated Vital Signs BP (!) 146/96 (BP Location: Left Arm)   Pulse 87   Temp 97.6 F (36.4 C) (Oral)   Resp 15   Ht 5' (1.524 m)    Wt 49 kg   LMP  (LMP Unknown)   SpO2 96%   BMI 21.10 kg/m   Physical Exam Vitals and nursing note reviewed.  Constitutional:      General: She is not in acute distress.    Appearance: She is well-developed.  HENT:     Head: Normocephalic and atraumatic.   Eyes:     Pupils: Pupils are equal, round, and reactive to light.  Cardiovascular:     Rate and Rhythm: Normal rate and regular rhythm.     Heart sounds: Normal heart sounds. No murmur heard.    No friction rub.  Pulmonary:     Effort: Pulmonary effort is normal.     Breath sounds: Normal breath sounds. No wheezing or rales.  Abdominal:     General: Bowel sounds are normal. There is no distension.     Palpations: Abdomen is soft.     Tenderness: There is no abdominal tenderness. There is no guarding or rebound.  Musculoskeletal:        General: No tenderness. Normal range of motion.     Cervical back: Normal range of motion and neck supple. No tenderness.     Comments: No edema.  Able to range bilateral upper extremities and lower extremities without any pain  Skin:    General: Skin is warm and dry.     Findings: No rash.  Neurological:     Mental Status: She is alert and oriented to person, place, and time. Mental status is at baseline.     Cranial Nerves: No cranial nerve deficit.     Sensory: No sensory deficit.     Motor: No weakness.     Coordination: Coordination normal.  Psychiatric:        Behavior: Behavior normal.     (all labs ordered are listed, but only abnormal results are displayed) Labs Reviewed - No data to display  EKG: None  Radiology: CT Cervical Spine Wo Contrast Result Date: 03/15/2024 EXAM: CT CERVICAL SPINE WITHOUT CONTRAST 03/15/2024 11:16:00 AM TECHNIQUE: CT of the cervical spine was performed without the administration of intravenous contrast. Multiplanar reformatted images are provided for review. Automated exposure control, iterative reconstruction, and/or weight based adjustment  of the mA/kV was utilized to reduce the radiation dose to as low as reasonably achievable. COMPARISON: 02/15/2024 CLINICAL HISTORY: Neck trauma (Age >= 65y) FINDINGS: BONES AND ALIGNMENT: Similar alignment of the cervical spine with slight reversal of the cervical lordosis. There is similar degenerative anterolisthesis of C3 on C4, C4 on C5, and C7 on T1. There is additional similar trace degenerative retrolisthesis of C5 on C6. There is ankylosis of the vertebral bodies from the C4 to C6  levels, similar to prior. No acute fracture or traumatic malalignment. DEGENERATIVE CHANGES: Disc space narrowing at multiple levels. Disc osteophyte complex at C6-C7 resulting in moderate spinal canal stenosis, similar to prior. Facet arthrosis and uncovertebral hypertrophy at multiple levels. Foraminal stenosis at multiple levels throughout the cervical spine. SOFT TISSUES: No prevertebral soft tissue swelling. LUNGS: Biapical pleural scarring. IMPRESSION: 1. No evidence of acute traumatic injury. 2. Degenerative changes as above. Electronically signed by: Donnice Mania MD 03/15/2024 11:32 AM EST RP Workstation: [id number]   CT Head Wo Contrast Result Date: 03/15/2024 EXAM: CT HEAD WITHOUT CONTRAST 03/15/2024 11:16:00 AM TECHNIQUE: CT of the head was performed without the administration of intravenous contrast. Automated exposure control, iterative reconstruction, and/or weight based adjustment of the mA/kV was utilized to reduce the radiation dose to as low as reasonably achievable. COMPARISON: 02/26/2024 CLINICAL HISTORY: Head trauma, moderate-severe. FINDINGS: BRAIN AND VENTRICLES: No acute hemorrhage. No evidence of acute infarct. No hydrocephalus. No extra-axial collection. No mass effect or midline shift. Similar remote left MCA territory infarct. Similar appearance of chronic microvascular ischemic changes and mild parenchymal volume loss. Chronic lacunar infarcts in the bilateral basal ganglia and cerebellum.  Atherosclerosis at the skull base. ORBITS: Bilateral lens replacements. SINUSES: No acute abnormality. SOFT TISSUES AND SKULL: Right frontal scalp hematoma. No skull fracture. IMPRESSION: 1. No acute intracranial abnormality related to the head trauma. 2. Right frontal scalp hematoma. 3. Similar remote left MCA territory infarct and chronic lacunar infarcts in the bilateral basal ganglia and cerebellum. Electronically signed by: Donnice Mania MD 03/15/2024 11:27 AM EST RP Workstation: [id number]     Procedures   Medications Ordered in the ED - No data to display                                  Medical Decision Making Amount and/or Complexity of Data Reviewed Radiology: ordered and independent interpretation performed. Decision-making details documented in ED Course.   Pt with multiple medical problems and comorbidities and presenting today with a complaint that caries a high risk for morbidity and mortality.  Here today with the above complaints.  Patient does have trauma to the right temporal area.  Neurologically intact but patient does take Eliquis .  CT to ensure no underlying skull fracture or bleed.  Patient currently is in no acute distress and well-appearing.  Low suspicion for an underlying medical condition that caused her fall today.  She reports that she slipped on a towel that was lying on the floor.  She denies any chest pain, shortness of breath, dizziness, fatigue or any abnormal symptoms in the last few days preceding the fall.  11:47 AM I have independently visualized and interpreted pt's images today.  CT of the head was negative for intracranial hemorrhage.  Cervical spine without fracture.  Radiology reports no acute intracranial abnormalities but she does have a right frontal scalp hematoma.  Prior left MCA territory infarcts and lacunar infarcts that are unchanged from the past.  No evidence of traumatic injury to the spine and degenerative changes noted.  All this was  discussed with the patient and at this time she remains awake and alert.  Feel that she can be discharged home.  She was given return precautions.       Final diagnoses:  Fall, initial encounter  Scalp hematoma, initial encounter    ED Discharge Orders     None  Doretha Folks, MD 03/15/24 1147  "

## 2024-03-15 NOTE — Progress Notes (Signed)
 Orthopedic Tech Progress Note Patient Details:  Theresa [name] 1931/07/28 [id number]  Level 2 trauma   Patient ID: Theresa [name], female   DOB: Aug 22, 1931, 89 y.o.   MRN: [id number]  Theresa [name] Pac 03/15/2024, 12:45 PM

## 2024-03-15 NOTE — ED Notes (Signed)
 [name] called unable to give pick up time

## 2024-03-15 NOTE — Discharge Instructions (Addendum)
 The CAT scan of your head and neck looked okay today.  There was no sign of internal bleeding or fractures.  It is okay to continue your current medications.  You can take Tylenol  as needed for pain.  If you start having repetitive vomiting, change in your vision, confusion or difficulty walking you would need to return to the emergency room immediately for repeat scan.

## 2024-03-15 NOTE — ED Triage Notes (Signed)
 Pt bib ems from [facility]. Pt fell around 9-10:30 am. Pt was sitting on toilet when she lost her balance on a towel. Pt has a hematoma on left side of head. Pt takes Eliquis .   BP 134/82 HR 72 RR16 CBG 184

## 2024-03-20 ENCOUNTER — Observation Stay (HOSPITAL_COMMUNITY)
Admission: EM | Admit: 2024-03-20 | Discharge: 2024-03-25 | Disposition: A | Discharge status: Skilled Nursing Facility | Attending: Internal Medicine | Admitting: Internal Medicine

## 2024-03-20 ENCOUNTER — Observation Stay (HOSPITAL_COMMUNITY)

## 2024-03-20 ENCOUNTER — Emergency Department (HOSPITAL_COMMUNITY)

## 2024-03-20 ENCOUNTER — Encounter (HOSPITAL_COMMUNITY): Payer: Self-pay | Admitting: Internal Medicine

## 2024-03-20 ENCOUNTER — Other Ambulatory Visit: Payer: Self-pay

## 2024-03-20 DIAGNOSIS — R4789 Other speech disturbances: Secondary | ICD-10-CM | POA: Diagnosis not present

## 2024-03-20 DIAGNOSIS — N1831 Chronic kidney disease, stage 3a: Secondary | ICD-10-CM | POA: Insufficient documentation

## 2024-03-20 DIAGNOSIS — K219 Gastro-esophageal reflux disease without esophagitis: Secondary | ICD-10-CM | POA: Diagnosis not present

## 2024-03-20 DIAGNOSIS — G40909 Epilepsy, unspecified, not intractable, without status epilepticus: Secondary | ICD-10-CM | POA: Diagnosis not present

## 2024-03-20 DIAGNOSIS — R4182 Altered mental status, unspecified: Secondary | ICD-10-CM | POA: Diagnosis present

## 2024-03-20 DIAGNOSIS — G9340 Encephalopathy, unspecified: Secondary | ICD-10-CM | POA: Diagnosis not present

## 2024-03-20 DIAGNOSIS — R296 Repeated falls: Secondary | ICD-10-CM | POA: Insufficient documentation

## 2024-03-20 DIAGNOSIS — I10 Essential (primary) hypertension: Secondary | ICD-10-CM | POA: Diagnosis not present

## 2024-03-20 DIAGNOSIS — D72829 Elevated white blood cell count, unspecified: Secondary | ICD-10-CM | POA: Diagnosis not present

## 2024-03-20 DIAGNOSIS — E034 Atrophy of thyroid (acquired): Secondary | ICD-10-CM

## 2024-03-20 DIAGNOSIS — I480 Paroxysmal atrial fibrillation: Secondary | ICD-10-CM | POA: Diagnosis not present

## 2024-03-20 DIAGNOSIS — F32A Depression, unspecified: Secondary | ICD-10-CM | POA: Diagnosis present

## 2024-03-20 DIAGNOSIS — I129 Hypertensive chronic kidney disease with stage 1 through stage 4 chronic kidney disease, or unspecified chronic kidney disease: Secondary | ICD-10-CM | POA: Insufficient documentation

## 2024-03-20 DIAGNOSIS — Z7901 Long term (current) use of anticoagulants: Secondary | ICD-10-CM | POA: Insufficient documentation

## 2024-03-20 DIAGNOSIS — G9341 Metabolic encephalopathy: Secondary | ICD-10-CM | POA: Diagnosis not present

## 2024-03-20 DIAGNOSIS — R509 Fever, unspecified: Secondary | ICD-10-CM | POA: Diagnosis present

## 2024-03-20 DIAGNOSIS — N183 Chronic kidney disease, stage 3 unspecified: Secondary | ICD-10-CM | POA: Diagnosis present

## 2024-03-20 DIAGNOSIS — Z87891 Personal history of nicotine dependence: Secondary | ICD-10-CM | POA: Diagnosis not present

## 2024-03-20 DIAGNOSIS — W19XXXA Unspecified fall, initial encounter: Secondary | ICD-10-CM | POA: Diagnosis not present

## 2024-03-20 DIAGNOSIS — E039 Hypothyroidism, unspecified: Secondary | ICD-10-CM | POA: Diagnosis present

## 2024-03-20 DIAGNOSIS — Z8673 Personal history of transient ischemic attack (TIA), and cerebral infarction without residual deficits: Secondary | ICD-10-CM | POA: Insufficient documentation

## 2024-03-20 DIAGNOSIS — E785 Hyperlipidemia, unspecified: Secondary | ICD-10-CM | POA: Diagnosis not present

## 2024-03-20 DIAGNOSIS — G3184 Mild cognitive impairment, so stated: Secondary | ICD-10-CM | POA: Diagnosis not present

## 2024-03-20 DIAGNOSIS — Z79899 Other long term (current) drug therapy: Secondary | ICD-10-CM | POA: Diagnosis not present

## 2024-03-20 LAB — COMPREHENSIVE METABOLIC PANEL WITH GFR
ALT: 46 U/L — ABNORMAL HIGH (ref 0–44)
AST: 65 U/L — ABNORMAL HIGH (ref 15–41)
Albumin: 4.6 g/dL (ref 3.5–5.0)
Alkaline Phosphatase: 147 U/L — ABNORMAL HIGH (ref 38–126)
Anion gap: 10 (ref 5–15)
BUN: 28 mg/dL — ABNORMAL HIGH (ref 8–23)
CO2: 25 mmol/L (ref 22–32)
Calcium: 9.5 mg/dL (ref 8.9–10.3)
Chloride: 102 mmol/L (ref 98–111)
Creatinine, Ser: 1.11 mg/dL — ABNORMAL HIGH (ref 0.44–1.00)
GFR, Estimated: 47 mL/min — ABNORMAL LOW
Glucose, Bld: 125 mg/dL — ABNORMAL HIGH (ref 70–99)
Potassium: 5.1 mmol/L (ref 3.5–5.1)
Sodium: 137 mmol/L (ref 135–145)
Total Bilirubin: 0.5 mg/dL (ref 0.0–1.2)
Total Protein: 7.1 g/dL (ref 6.5–8.1)

## 2024-03-20 LAB — URINALYSIS, ROUTINE W REFLEX MICROSCOPIC
Bacteria, UA: NONE SEEN
Bilirubin Urine: NEGATIVE
Glucose, UA: NEGATIVE mg/dL
Hgb urine dipstick: NEGATIVE
Ketones, ur: NEGATIVE mg/dL
Nitrite: NEGATIVE
Protein, ur: 100 mg/dL — AB
Specific Gravity, Urine: 1.017 (ref 1.005–1.030)
pH: 5 (ref 5.0–8.0)

## 2024-03-20 LAB — I-STAT CHEM 8, ED
BUN: 27 mg/dL — ABNORMAL HIGH (ref 8–23)
Calcium, Ion: 1.08 mmol/L — ABNORMAL LOW (ref 1.15–1.40)
Chloride: 106 mmol/L (ref 98–111)
Creatinine, Ser: 1 mg/dL (ref 0.44–1.00)
Glucose, Bld: 108 mg/dL — ABNORMAL HIGH (ref 70–99)
HCT: 35 % — ABNORMAL LOW (ref 36.0–46.0)
Hemoglobin: 11.9 g/dL — ABNORMAL LOW (ref 12.0–15.0)
Potassium: 4.3 mmol/L (ref 3.5–5.1)
Sodium: 141 mmol/L (ref 135–145)
TCO2: 23 mmol/L (ref 22–32)

## 2024-03-20 LAB — CBC
HCT: 42.6 % (ref 36.0–46.0)
Hemoglobin: 13.7 g/dL (ref 12.0–15.0)
MCH: 31.4 pg (ref 26.0–34.0)
MCHC: 32.2 g/dL (ref 30.0–36.0)
MCV: 97.5 fL (ref 80.0–100.0)
Platelets: 202 K/uL (ref 150–400)
RBC: 4.37 MIL/uL (ref 3.87–5.11)
RDW: 13.3 % (ref 11.5–15.5)
WBC: 12.7 K/uL — ABNORMAL HIGH (ref 4.0–10.5)
nRBC: 0 % (ref 0.0–0.2)

## 2024-03-20 LAB — RESPIRATORY PANEL BY PCR

## 2024-03-20 LAB — RESP PANEL BY RT-PCR (RSV, FLU A&B, COVID)  RVPGX2
Influenza A by PCR: NEGATIVE
Influenza B by PCR: NEGATIVE
Resp Syncytial Virus by PCR: NEGATIVE
SARS Coronavirus 2 by RT PCR: NEGATIVE

## 2024-03-20 LAB — MAGNESIUM: Magnesium: 2.1 mg/dL (ref 1.7–2.4)

## 2024-03-20 MED ORDER — IOHEXOL 350 MG/ML SOLN
75.0000 mL | Freq: Once | INTRAVENOUS | Status: AC | PRN
  Administered 2024-03-20: 75 mL via INTRAVENOUS

## 2024-03-20 MED ORDER — LEVOTHYROXINE SODIUM 75 MCG PO TABS
75.0000 ug | ORAL_TABLET | Freq: Every day | ORAL | Status: DC
  Administered 2024-03-21 – 2024-03-25 (×5): 75 ug via ORAL
  Filled 2024-03-20 (×5): qty 1

## 2024-03-20 MED ORDER — SODIUM CHLORIDE 0.9% FLUSH
3.0000 mL | Freq: Two times a day (BID) | INTRAVENOUS | Status: DC
  Administered 2024-03-20 – 2024-03-24 (×7): 3 mL via INTRAVENOUS

## 2024-03-20 MED ORDER — LACOSAMIDE 50 MG PO TABS
50.0000 mg | ORAL_TABLET | Freq: Two times a day (BID) | ORAL | Status: DC
  Administered 2024-03-20: 50 mg via ORAL
  Filled 2024-03-20: qty 1

## 2024-03-20 MED ORDER — POLYETHYLENE GLYCOL 3350 17 G PO PACK: 17.0000 g | PACK | Freq: Every day | ORAL | Status: DC | PRN

## 2024-03-20 MED ORDER — ACETAMINOPHEN 650 MG RE SUPP: 650.0000 mg | Freq: Four times a day (QID) | RECTAL | Status: DC | PRN

## 2024-03-20 MED ORDER — ACETAMINOPHEN 325 MG PO TABS
650.0000 mg | ORAL_TABLET | Freq: Four times a day (QID) | ORAL | Status: DC | PRN
  Administered 2024-03-22: 650 mg via ORAL
  Filled 2024-03-20 (×2): qty 2

## 2024-03-20 MED ORDER — APIXABAN 2.5 MG PO TABS
2.5000 mg | ORAL_TABLET | Freq: Two times a day (BID) | ORAL | Status: DC
  Administered 2024-03-20 – 2024-03-25 (×10): 2.5 mg via ORAL
  Filled 2024-03-20 (×10): qty 1

## 2024-03-20 MED ORDER — ROSUVASTATIN CALCIUM 5 MG PO TABS
10.0000 mg | ORAL_TABLET | Freq: Every day | ORAL | Status: DC
  Administered 2024-03-21 – 2024-03-25 (×5): 10 mg via ORAL
  Filled 2024-03-20 (×5): qty 2

## 2024-03-20 NOTE — ED Provider Triage Note (Signed)
 Emergency Medicine Provider Triage Evaluation Note  Theresa [name] , a 89 y.o. female  was evaluated in triage.  Pt complains of fall, confusion.  Review of Systems  Positive: Fall, confusion, low-grade fever Negative: Chest pain, abdominal pain, numbness, weakness  Physical Exam  LMP  (LMP Unknown)  Gen:   Awake, no distress   Resp:  Normal effort  MSK:   Moves extremities without difficulty   Medical Decision Making  Medically screening exam initiated at 1:11 PM.  Appropriate orders placed.  Theresa [name] was informed that the remainder of the evaluation will be completed by another provider, this initial triage assessment does not replace that evaluation, and the importance of remaining in the ED until their evaluation is complete.  Patient presents from assisted living facility for concern of unwitnessed fall this morning at 5 AM in addition to new onset of confusion today.  She reportedly had a recent low-grade fever.  She is on Eliquis .  She was seen in the ED 5 days ago for a separate fall.  She does have ongoing right forehead bruising from that fall.  Patient denies any new areas of pain.   [name], MD 03/20/24 [phone]

## 2024-03-20 NOTE — ED Provider Notes (Signed)
 " [name] Provider Note   CSN: [id number] Arrival date & time: 03/20/24  1307     Patient presents with: Fall and Altered Mental Status   Theresa [name] is a 89 y.o. female.   89 year old female past medical history of CVA and atrial fibrillation on Eliquis  presenting to the emergency department today after a fall at her nursing facility.  This was unwitnessed earlier.  The patient does not member this.  They reports that she is little more confused than normal.  The patient is not able to really provide much additional history at this point but is denying any complaints.   Fall  Altered Mental Status      Prior to Admission medications  Medication Sig Start Date End Date Taking? Authorizing Provider  amoxicillin  (AMOXIL ) 500 MG capsule Take 2,000 mg by mouth as needed (dental Appointment). Give 2000 mg by mouth as needed for dental appointments AM of dental appointments   Yes [provider]  apixaban  (ELIQUIS ) 2.5 MG TABS tablet Take 1 tablet (2.5 mg total) by mouth 2 (two) times daily. 04/08/23  Yes Tobie Yetta HERO, MD  Calcium  Citrate-Vitamin D  (CITRACAL PETITES/VITAMIN D ) 200-6.25 MG-MCG TABS Take 1 tablet by mouth 2 (two) times daily.   Yes [provider]  docusate sodium  (COLACE) 100 MG capsule Take 1 capsule (100 mg total) by mouth 2 (two) times daily. 03/09/23  Yes Rai, Ripudeep K, MD  escitalopram  (LEXAPRO ) 10 MG tablet Take 15 mg by mouth daily.   Yes [provider]  fluticasone  (FLONASE ) 50 MCG/ACT nasal spray Place 2 sprays into both nostrils daily. 02/11/22  Yes Jesus Bernardino MATSU, MD  glucosamine-chondroitin 500-400 MG tablet Take 1 tablet by mouth 2 (two) times daily.   Yes [provider]  Lacosamide  100 MG TABS Take 1 tablet (100 mg total) by mouth in the morning and at bedtime. 03/02/24  Yes Medina-Vargas, Monina C, NP  levothyroxine  (SYNTHROID ) 75 MCG tablet TAKE 1 TABLET BY MOUTH  EVERY DAY BEFORE BREAKFAST 12/12/22  Yes Katrinka Garnette KIDD, MD  loratadine  (CLARITIN ) 10 MG tablet Take 1 tablet (10 mg total) by mouth daily. 02/11/22  Yes Jesus Bernardino MATSU, MD  metoprolol  tartrate (LOPRESSOR ) 25 MG tablet Take 1 tablet (25 mg total) by mouth 2 (two) times daily. 03/09/23  Yes Rai, Ripudeep K, MD  Multiple Vitamins-Minerals (PRESERVISION AREDS 2 PO) Take 1 capsule by mouth 2 (two) times daily. Reported on 09/04/2015   Yes [provider]  polyethylene glycol (MIRALAX  / GLYCOLAX ) 17 g packet Take 17 g by mouth daily as needed for mild constipation. 03/09/23  Yes Rai, Ripudeep K, MD  rosuvastatin  (CRESTOR ) 10 MG tablet Take 10 mg by mouth once. 10 mg by mouth in the evening for HLD   Yes [provider]  Saline (SIMPLY SALINE) 0.9 % AERS Place 2 each into the nose daily as needed. 02/11/22  Yes Jesus Bernardino MATSU, MD  TYLENOL  8 HOUR ARTHRITIS PAIN 650 MG CR tablet Take 650 mg by mouth daily.   Yes [provider]  vitamin A 10000 UNIT capsule Take 1 capsule by mouth daily.   Yes [provider]  zinc  oxide 20 % ointment Apply 1 Application topically See admin instructions. Apply topically to peri/buttocks as needed after each incontinent episode for skin protection.   Yes [provider]    Allergies: Tape and Vioxx [rofecoxib]    Review of Systems  Reason unable to perform  ROS: Current mental status.    Updated Vital Signs BP (!) 155/116   Pulse 97   Temp 98.4 F (36.9 C)   Resp 17   LMP  (LMP Unknown)   SpO2 97%   Physical Exam Vitals and nursing note reviewed.   Gen: NAD Eyes: PERRL, EOMI, right periorbital ecchymosis that is old appearing noted HEENT: no oropharyngeal swelling Neck: trachea midline, no cervical spine tenderness, no stepoffs or deformities Resp: clear to auscultation bilaterally Card: RRR, no murmurs, rubs, or gallops Abd: nontender, nondistended, no seatbelt sign Extremities: no calf tenderness, no  edema MSK: no thoracic spinal tenderness, no lumbar spinal tenderness, no step-offs or deformities Vascular: 2+ radial pulses bilaterally, 2+ DP pulses bilaterally Neuro: Alert and oriented x 3, equal strength sensation throughout bilateral upper and lower extremities Skin: no rashes    (all labs ordered are listed, but only abnormal results are displayed) Labs Reviewed  COMPREHENSIVE METABOLIC PANEL WITH GFR - Abnormal; Notable for the following components:      Result Value   Glucose, Bld 125 (*)    BUN 28 (*)    Creatinine, Ser 1.11 (*)    AST 65 (*)    ALT 46 (*)    Alkaline Phosphatase 147 (*)    GFR, Estimated 47 (*)    All other components within normal limits  CBC - Abnormal; Notable for the following components:   WBC 12.7 (*)    All other components within normal limits  URINALYSIS, ROUTINE W REFLEX MICROSCOPIC - Abnormal; Notable for the following components:   Protein, ur 100 (*)    Leukocytes,Ua SMALL (*)    All other components within normal limits  I-STAT CHEM 8, ED - Abnormal; Notable for the following components:   BUN 27 (*)    Glucose, Bld 108 (*)    Calcium , Ion 1.08 (*)    Hemoglobin 11.9 (*)    HCT 35.0 (*)    All other components within normal limits  RESP PANEL BY RT-PCR (RSV, FLU A&B, COVID)  RVPGX2  MAGNESIUM  AMMONIA    EKG: EKG Interpretation Date/Time:  Sunday March 20 2024 13:43:32 EST Ventricular Rate:  105 PR Interval:    QRS Duration:  98 QT Interval:  344 QTC Calculation: 454 R Axis:   -66  Text Interpretation: Atrial flutter with variable A-V block Left axis deviation Nonspecific ST and T wave abnormality Abnormal ECG Confirmed by Ula Barter [id number]) on 03/20/2024 4:11:45 PM  Radiology: CT CERVICAL SPINE WO CONTRAST Result Date: 03/20/2024 CLINICAL DATA:  Trauma EXAM: CT CERVICAL SPINE WITHOUT CONTRAST TECHNIQUE: Multidetector CT imaging of the cervical spine was performed without intravenous contrast. Multiplanar CT image  reconstructions were also generated. RADIATION DOSE REDUCTION: This exam was performed according to the departmental dose-optimization program which includes automated exposure control, adjustment of the mA and/or kV according to patient size and/or use of iterative reconstruction technique. COMPARISON:  CT 03/15/2024, 02/15/2024 FINDINGS: Alignment: Reversal of cervical lordosis as before. Trace anterolisthesis C3 on C4 and C7 on T1. Similar fused anterolisthesis C4 on C5. Stable facet alignment with chronic, fused slightly anterior positioning of the right C4 facet. Skull base and vertebrae: No acute fracture is seen. Interbody fusion C4-C5 and C5-C6 with facet ankylosis at C4-C5. Soft tissues and spinal canal: No prevertebral fluid or swelling. No visible canal hematoma. Disc levels: Interbody fusion C4 through C6. Advanced disc space narrowing and degenerative change C3-C4 and C6-C7. At least moderate spinal stenosis at C6-C7. Facet degenerative  change at multiple levels with foraminal narrowing. Upper chest: Negative. Other: None IMPRESSION: 1. No acute osseous abnormality. 2. Similar reversal of cervical lordosis and degenerative changes as above. Suspected at least moderate spinal stenosis at C6-C7 Electronically Signed   By: Luke Bun M.D.   On: 03/20/2024 15:23   CT HEAD WO CONTRAST Result Date: 03/20/2024 CLINICAL DATA:  Head trauma EXAM: CT HEAD WITHOUT CONTRAST TECHNIQUE: Contiguous axial images were obtained from the base of the skull through the vertex without intravenous contrast. RADIATION DOSE REDUCTION: This exam was performed according to the departmental dose-optimization program which includes automated exposure control, adjustment of the mA and/or kV according to patient size and/or use of iterative reconstruction technique. COMPARISON:  CT 03/15/2024, 02/15/2024 FINDINGS: Brain: No acute territorial infarction, hemorrhage or intracranial mass. Chronic left MCA infarct. Atrophy and  chronic small vessel ischemic changes of the white matter. Small chronic cerebellar infarcts. Chronic lacunar infarcts in the basal ganglia. Stable ventricle size. Vascular: No hyperdense vessels.  Carotid vascular calcification Skull: No fracture Sinuses/Orbits: No acute finding. Other: Small right frontal scalp hematoma, decreased compared to prior. IMPRESSION: 1. No CT evidence for acute intracranial abnormality. 2. Atrophy and chronic small vessel ischemic changes of the white matter. Chronic left MCA infarct and small chronic cerebellar and basal ganglial infarcts Electronically Signed   By: Luke Bun M.D.   On: 03/20/2024 15:11   DG Pelvis 1-2 Views Result Date: 03/20/2024 CLINICAL DATA:  [id number] Trauma [id number] EXAM: PELVIS - 1-2 VIEW COMPARISON:  March 03, 2023, February 15, 2024 FINDINGS: Osteopenia. No acute fracture or dislocation. Partial visualization of orthopedic hardware status post posterior fixation of the lower lumbar spine and RIGHT hip arthroplasty. Sacrum is obscured by overlapping bowel contents. IMPRESSION: 1. No acute fracture or dislocation. 2. If there is a persistent clinical concern for nondisplaced hip or pelvic fracture, recommend dedicated pelvic CT or MRI. Electronically Signed   By: Corean Salter M.D.   On: 03/20/2024 13:55   DG Chest 1 View Result Date: 03/20/2024 CLINICAL DATA:  [id number] Trauma [id number] EXAM: CHEST  1 VIEW COMPARISON:  February 15, 2024, March 03, 2023 FINDINGS: The cardiomediastinal silhouette is unchanged and enlarged in contour.Status post valve repair. Atherosclerotic calcifications of the tortuous aorta. No pleural effusion. No pneumothorax. No acute pleuroparenchymal abnormality. Remote LEFT-sided rib fractures. IMPRESSION: No acute cardiopulmonary abnormality. Electronically Signed   By: Corean Salter M.D.   On: 03/20/2024 13:53     Procedures   Medications Ordered in the ED - No data to display                                   Medical Decision Making 89 year old female past medical history of seizures, CVA, and atrial fibrillation on Eliquis  presenting to the emergency department today after an unwitnessed fall at her nursing facility.  I will further evaluate the patient here with basic labs to evaluate for anemia or electrolyte abnormalities.  Will obtain EKG and keep patient on the cardiac monitor to evaluate for arrhythmias.  Will obtain a CT scan of her head and cervical spine for further evaluation for acute traumatic injuries.  Also obtain an ammonia level and urinalysis to evaluate for reasons for her confusion.  I will reevaluate for ultimate disposition.  The patient is a workup is largely unremarkable.  I was able to speak with a child psychotherapist from her assisted living facility.  This  does seem to be a relatively acute change over the past 24 hours.  I did try to get in touch with the patient's POA but was unsuccessful.  The patient's social worker reports that she is normally able to handle her husband's affairs and most recently did this yesterday.  She reports that compared to her baseline the patient does seem to be having word finding difficulty and does seem somewhat confused.  Does seem to be trying to answer questions but answering inappropriately.  MRI is ordered.  This being an acute change and with her being from an assisted living facility calls placed the hospitalist service for admission for encephalopathy versus CVA.  She does not have any obvious focal unilateral deficits on exam to suggest a large vessel occlusion and with her last known normal being yesterday she is outside the window for any acute intervention at this time.  Amount and/or Complexity of Data Reviewed Labs: ordered. Radiology: ordered.        Final diagnoses:  Acute encephalopathy    ED Discharge Orders     None          Ula Prentice SAUNDERS, MD 03/20/24 1831  "

## 2024-03-20 NOTE — H&P (Addendum)
 " History and Physical   Theresa [name] FMW:[id number] DOB: December 24, 1931 DOA: 03/20/2024  PCP: Theresa Fredia CROME, [name]   Patient coming from: SNF  Chief Complaint: Altered mental status, fall  HPI: Theresa [name] is a 89 y.o. female with medical history significant of hypertension, hyperlipidemia, TIA, CVA, hypothyroidism, atrial fibrillation, GERD, CKD 3, depression, seizure disorder, mild cognitive impairment presenting after a fall and altered mental status.  Patient was at [facility] today.  Had unwitnessed fall per staff.  Patient does not remember the events.  Facility and patient social worker reported to the EDP that this was an increased confusion for baseline over the past 24 hours.  Patient with new word finding difficulty as well.  Patient has had increased falls for the past month.  Patient denies fevers, chills, chest pain, shortness of breath, abdominal pain, constipation, diarrhea, nausea, vomiting.  ED Course: Vital signs in the ED notable for blood pressure in the 150s-160s systolic.  Lab workup included CMP with BUN 28, creatinine 1.11, glucose 125, AST 65, ALT 46, alk phos 147.  CBC with leukocytosis of 12.7.  Respiratory panel for flu COVID RSV negative.  Urinalysis with protein, leukocytes, no bacteria.  Ammonia level pending.  Magnesium normal.  Chest x-ray showed no acute Sharol.  Pelvis x-ray showed no acute Sharol.  CT head and CT C-spine showed no acute abnormality.  MRI brain ordered and is pending.  No initial interventions in the ED.  Review of Systems: As per HPI otherwise all other systems reviewed and are negative.  Past Medical History:  Diagnosis Date   Atrial fibrillation (HCC)    AFIB   Collagenous colitis    CVA (cerebral vascular accident) (HCC)    a.  L parietal CVA by MRI 12/12, likely embolic;  b.  TEE by report with neg bubble study;  c.  carotid dopplers  12/12:  + plaque, no sig ICA stenosis    History of postmenopausal HRT    Hx of  adenomatous colonic polyps    Hx of ischemic left MCA stroke 04/08/2011   Long term numbness in right hand in 1st 2 fingers, as well as slowed speech  Atorvastatin , Xarelto  20mg . -? If stroke from a fib      Hypothyroidism    MVP (mitral valve prolapse)    a. s/p MV repair at [facility] in 2008;  b. Echocardiogram 7/12: EF 50%, status post mitral valve repair with mild MR and minimal MS, mean gradient 4, moderate LAE, LA diam 55 mm; mild RAE, PASP 28-32;    Osteoarthritis    Osteoporosis     Past Surgical History:  Procedure Laterality Date   ANTERIOR APPROACH HEMI HIP ARTHROPLASTY Right 03/05/2023   Procedure: ANTERIOR APPROACH HEMI HIP ARTHROPLASTY;  Surgeon: Theresa Rogue, [name];  Location: [facility] ORS;  Service: Orthopedics;  Laterality: Right;   CARDIOVERSION  01/09/2012   Procedure: CARDIOVERSION;  Surgeon: Theresa JONELLE Fuel, [name];  Location: [facility] ENDOSCOPY;  Service: Cardiovascular;  Laterality: N/A;   CATARACT EXTRACTION     CHOLECYSTECTOMY     DILATION AND CURETTAGE OF UTERUS     LUMBAR FUSION     MITRAL VALVE REPAIR  03/17/2006   mitral valve repair CE ring and maze procedure   TONSILLECTOMY      Social History  reports that she quit smoking about 57 years ago. Her smoking use included cigarettes. She has never used smokeless tobacco. She reports current alcohol use of about 7.0 standard drinks of alcohol per  week. She reports that she does not use drugs.  Allergies[1]  Family History  Problem Relation Age of Onset   Cancer Father        COLON   Stroke Neg Hx        1ST DEGREE RELATIVE <60  Reviewed on admission  Prior to Admission medications  Medication Sig Start Date End Date Taking? Authorizing Provider  amoxicillin  (AMOXIL ) 500 MG capsule Take 2,000 mg by mouth as needed (dental Appointment). Give 2000 mg by mouth as needed for dental appointments AM of dental appointments   Yes Provider, Historical, [name]  apixaban  (ELIQUIS ) 2.5 MG TABS tablet Take 1 tablet (2.5 mg total) by  mouth 2 (two) times daily. 04/08/23  Yes Tobie Yetta HERO, [name]  Calcium  Citrate-Vitamin D  (CITRACAL PETITES/VITAMIN D ) 200-6.25 MG-MCG TABS Take 1 tablet by mouth 2 (two) times daily.   Yes Provider, Historical, [name]  docusate sodium  (COLACE) 100 MG capsule Take 1 capsule (100 mg total) by mouth 2 (two) times daily. 03/09/23  Yes Theresa [name]  escitalopram  (LEXAPRO ) 10 MG tablet Take 15 mg by mouth daily.   Yes Provider, Historical, [name]  fluticasone  (FLONASE ) 50 MCG/ACT nasal spray Place 2 sprays into both nostrils daily. 02/11/22  Yes Theresa [name]  glucosamine-chondroitin 500-400 MG tablet Take 1 tablet by mouth 2 (two) times daily.   Yes Provider, Historical, [name]  Lacosamide  100 MG TABS Take 1 tablet (100 mg total) by mouth in the morning and at bedtime. 03/02/24  Yes Theresa [name]  levothyroxine  (SYNTHROID ) 75 MCG tablet TAKE 1 TABLET BY MOUTH EVERY DAY BEFORE BREAKFAST 12/12/22  Yes Theresa [name]  loratadine  (CLARITIN ) 10 MG tablet Take 1 tablet (10 mg total) by mouth daily. 02/11/22  Yes Theresa [name]  metoprolol  tartrate (LOPRESSOR ) 25 MG tablet Take 1 tablet (25 mg total) by mouth 2 (two) times daily. 03/09/23  Yes Theresa [name]  Multiple Vitamins-Minerals (PRESERVISION AREDS 2 PO) Take 1 capsule by mouth 2 (two) times daily. Reported on 09/04/2015   Yes Provider, Historical, [name]  polyethylene glycol (MIRALAX  / GLYCOLAX ) 17 g packet Take 17 g by mouth daily as needed for mild constipation. 03/09/23  Yes Theresa [name]  rosuvastatin  (CRESTOR ) 10 MG tablet Take 10 mg by mouth once. 10 mg by mouth in the evening for HLD   Yes Provider, Historical, [name]  Saline (SIMPLY SALINE) 0.9 % AERS Place 2 each into the nose daily as needed. 02/11/22  Yes Theresa [name]  TYLENOL  8 HOUR ARTHRITIS PAIN 650 MG CR tablet Take 650 mg by mouth daily.   Yes Provider, Historical, [name]  vitamin A 10000 UNIT capsule Take 1 capsule by mouth daily.   Yes Provider,  Historical, [name]  zinc  oxide 20 % ointment Apply 1 Application topically See admin instructions. Apply topically to peri/buttocks as needed after each incontinent episode for skin protection.   Yes Provider, Historical, [name]    Physical Exam: Vitals:   03/20/24 1316 03/20/24 1317 03/20/24 1634 03/20/24 1637  BP:  (!) 166/109 (!) 155/116 (!) 155/116  Pulse:  94  97  Resp:  17  17  Temp:  98.1 F (36.7 C)  98.4 F (36.9 C)  SpO2: 96% 95%  97%    Physical Exam Constitutional:      General: She is not in acute distress.    Appearance: Normal appearance.  HENT:     Head: Normocephalic and atraumatic.  Mouth/Throat:     Mouth: Mucous membranes are moist.     Pharynx: Oropharynx is clear.  Eyes:     Extraocular Movements: Extraocular movements intact.     Pupils: Pupils are equal, round, and reactive to light.  Cardiovascular:     Rate and Rhythm: Normal rate and regular rhythm.     Pulses: Normal pulses.     Heart sounds: Normal heart sounds.  Pulmonary:     Effort: Pulmonary effort is normal. No respiratory distress.     Breath sounds: Normal breath sounds.  Abdominal:     General: Bowel sounds are normal. There is no distension.     Palpations: Abdomen is soft.     Tenderness: There is no abdominal tenderness.  Musculoskeletal:        General: No swelling or deformity.  Skin:    General: Skin is warm and dry.  Neurological:     Comments: Mental Status: Patient is awake, alert Some word finding difficulty Cranial Nerves: II: Pupils equal, round, and reactive III,IV, VI: EOMI  V: Facial sensation is symmetric to light touch VII: Facial movement is symmetric.  VIII: hearing is intact to voice X: Uvula elevates symmetrically XI: Shoulder shrug is symmetric. XII: Difficulty with patient following instructions to evaluate tongue movement Motor: Good effort throughout with encouragement, but difficult to get patient to participate fully in knee flexion/extension.  Upper  extremities 5/5 bilaterally.  Lower extremities 5/5 with ankle extension and 4-5/5 with ankle flexion.  Sensory: Sensation is grossly intact bilateral UEs & LEs    Labs on Admission: I have personally reviewed following labs and imaging studies  CBC: Recent Labs  Lab 03/20/24 1355 03/20/24 1408  WBC 12.7*  --   HGB 13.7 11.9*  HCT 42.6 35.0*  MCV 97.5  --   PLT 202  --     Basic Metabolic Panel: Recent Labs  Lab 03/20/24 1355 03/20/24 1408  NA 137 141  K 5.1 4.3  CL 102 106  CO2 25  --   GLUCOSE 125* 108*  BUN 28* 27*  CREATININE 1.11* 1.00  CALCIUM  9.5  --   MG 2.1  --     GFR: Estimated Creatinine Clearance: 26.9 mL/min (by C-G formula based on SCr of 1 mg/dL).  Liver Function Tests: Recent Labs  Lab 03/20/24 1355  AST 65*  ALT 46*  ALKPHOS 147*  BILITOT 0.5  PROT 7.1  ALBUMIN 4.6    Urine analysis:    Component Value Date/Time   COLORURINE YELLOW 03/20/2024 1355   APPEARANCEUR CLEAR 03/20/2024 1355   LABSPEC 1.017 03/20/2024 1355   PHURINE 5.0 03/20/2024 1355   GLUCOSEU NEGATIVE 03/20/2024 1355   HGBUR NEGATIVE 03/20/2024 1355   HGBUR negative 12/19/2009 0945   BILIRUBINUR NEGATIVE 03/20/2024 1355   BILIRUBINUR 2+ 01/13/2013 1027   KETONESUR NEGATIVE 03/20/2024 1355   PROTEINUR 100 (A) 03/20/2024 1355   UROBILINOGEN 0.2 03/21/2013 1242   NITRITE NEGATIVE 03/20/2024 1355   LEUKOCYTESUR SMALL (A) 03/20/2024 1355    Radiological Exams on Admission: CT CERVICAL SPINE WO CONTRAST Result Date: 03/20/2024 CLINICAL DATA:  Trauma EXAM: CT CERVICAL SPINE WITHOUT CONTRAST TECHNIQUE: Multidetector CT imaging of the cervical spine was performed without intravenous contrast. Multiplanar CT image reconstructions were also generated. RADIATION DOSE REDUCTION: This exam was performed according to the departmental dose-optimization program which includes automated exposure control, adjustment of the mA and/or kV according to patient size and/or use of iterative  reconstruction technique. COMPARISON:  CT 03/15/2024,  02/15/2024 FINDINGS: Alignment: Reversal of cervical lordosis as before. Trace anterolisthesis C3 on C4 and C7 on T1. Similar fused anterolisthesis C4 on C5. Stable facet alignment with chronic, fused slightly anterior positioning of the right C4 facet. Skull base and vertebrae: No acute fracture is seen. Interbody fusion C4-C5 and C5-C6 with facet ankylosis at C4-C5. Soft tissues and spinal canal: No prevertebral fluid or swelling. No visible canal hematoma. Disc levels: Interbody fusion C4 through C6. Advanced disc space narrowing and degenerative change C3-C4 and C6-C7. At least moderate spinal stenosis at C6-C7. Facet degenerative change at multiple levels with foraminal narrowing. Upper chest: Negative. Other: None IMPRESSION: 1. No acute osseous abnormality. 2. Similar reversal of cervical lordosis and degenerative changes as above. Suspected at least moderate spinal stenosis at C6-C7 Electronically Signed   By: Luke Bun M.D.   On: 03/20/2024 15:23   CT HEAD WO CONTRAST Result Date: 03/20/2024 CLINICAL DATA:  Head trauma EXAM: CT HEAD WITHOUT CONTRAST TECHNIQUE: Contiguous axial images were obtained from the base of the skull through the vertex without intravenous contrast. RADIATION DOSE REDUCTION: This exam was performed according to the departmental dose-optimization program which includes automated exposure control, adjustment of the mA and/or kV according to patient size and/or use of iterative reconstruction technique. COMPARISON:  CT 03/15/2024, 02/15/2024 FINDINGS: Brain: No acute territorial infarction, hemorrhage or intracranial mass. Chronic left MCA infarct. Atrophy and chronic small vessel ischemic changes of the white matter. Small chronic cerebellar infarcts. Chronic lacunar infarcts in the basal ganglia. Stable ventricle size. Vascular: No hyperdense vessels.  Carotid vascular calcification Skull: No fracture Sinuses/Orbits: No acute  finding. Other: Small right frontal scalp hematoma, decreased compared to prior. IMPRESSION: 1. No CT evidence for acute intracranial abnormality. 2. Atrophy and chronic small vessel ischemic changes of the white matter. Chronic left MCA infarct and small chronic cerebellar and basal ganglial infarcts Electronically Signed   By: Luke Bun M.D.   On: 03/20/2024 15:11   DG Pelvis 1-2 Views Result Date: 03/20/2024 CLINICAL DATA:  [id number] Trauma [id number] EXAM: PELVIS - 1-2 VIEW COMPARISON:  March 03, 2023, February 15, 2024 FINDINGS: Osteopenia. No acute fracture or dislocation. Partial visualization of orthopedic hardware status post posterior fixation of the lower lumbar spine and RIGHT hip arthroplasty. Sacrum is obscured by overlapping bowel contents. IMPRESSION: 1. No acute fracture or dislocation. 2. If there is a persistent clinical concern for nondisplaced hip or pelvic fracture, recommend dedicated pelvic CT or MRI. Electronically Signed   By: Corean Salter M.D.   On: 03/20/2024 13:55   DG Chest 1 View Result Date: 03/20/2024 CLINICAL DATA:  [id number] Trauma [id number] EXAM: CHEST  1 VIEW COMPARISON:  February 15, 2024, March 03, 2023 FINDINGS: The cardiomediastinal silhouette is unchanged and enlarged in contour.Status post valve repair. Atherosclerotic calcifications of the tortuous aorta. No pleural effusion. No pneumothorax. No acute pleuroparenchymal abnormality. Remote LEFT-sided rib fractures. IMPRESSION: No acute cardiopulmonary abnormality. Electronically Signed   By: Corean Salter M.D.   On: 03/20/2024 13:53   EKG: Independently reviewed.  Atrial flutter with variable block with rate 105 bpm.  Nonspecific T wave changes.  Assessment/Plan Principal Problem:   Acute encephalopathy Active Problems:   Paroxysmal atrial fibrillation (HCC)   Hypothyroidism   GASTROESOPHAGEAL REFLUX DISEASE, MILD   Hypertension   History of TIA (transient ischemic attack) and stroke   CKD (chronic  kidney disease), stage III (HCC)   Hyperlipidemia   Depression   MCI (mild cognitive impairment)   Seizure disorder (  HCC)   Acute cephalopathy Rule out CVA History of TIA/CVA Seizure disorder > Patient presenting with decline in mental status over the last 24 hours with new word finding difficulty and disorientation.  No other focal deficits noted though some portions of the exam limited by patient's participation/ability to follow directions. > History of prior CVA and TIA.  Baseline mild cognitive impairment per chart.  Though currently lives in ALF and participates in the discussions about the care for her husband who is in the SNF portion of the facility.  History of seizure disorder.  CT head and CT C-spine without acute abnormality. > No witnessed seizure activity and this would be prolonged postictal state if it were seizure however with no alternative explanation check EEG. > No evidence of infection on chest x-ray or urinalysis.  Does have mild leukocytosis 12.7 however.  With mildly elevated LFTs this may need further investigation.  - Monitor on telemetry overnight - MRI brain - EEG - Check TSH - Check B12 - Follow-up ammonia level - Trend fever curve and WBC - Full RVP, to evaluate for viral infectious etiology of encephalopathy. - Trend LFTs - Continue with home rosuvastatin  - Continue home lacosamide  - Hold metoprolol  in case permissive hypertension as needed - If MRI positive, pursue CVA workup and neurology consultation  Hypertension - Permissive hypertension pending result of MRI  Hyperlipidemia - Continue rosuvastatin   Hypothyroidism - Continue home Synthroid  - Checking TSH as above  CKD 3 > Creatinine stable - Trend renal function and electrolytes  Depression - Continue home Lexapro   Atrial fibrillation - Holding metoprolol  - Continue home Eliquis   DVT prophylaxis: Eliquis  Code Status:   DNR.  Gold form at bedside and confirmed with patient's  POA/social work. Family Communication:  Child psychotherapist updated at bedside.  She will be available until midnight by phone/text if needed.  Disposition Plan:   Patient is from:  SNF  Anticipated DC to:  Same as above  Anticipated DC date:  1 to 3 days  Anticipated DC barriers: None  Consults called:  None  Admission status:  Observation, telemetry  Severity of Illness: The appropriate patient status for this patient is OBSERVATION. Observation status is judged to be reasonable and necessary in order to provide the required intensity of service to ensure the patient's safety. The patient's presenting symptoms, physical exam findings, and initial radiographic and laboratory data in the context of their medical condition is felt to place them at decreased risk for further clinical deterioration. Furthermore, it is anticipated that the patient will be medically stable for discharge from the hospital within 2 midnights of admission.    Marsa KATHEE Scurry [name] [facility]  How to contact the [facility] Attending or Consulting provider 7A - 7P or covering provider during after hours 7P -7A, for this patient?   Check the care team in [facility] and look for a) attending/consulting [facility] provider listed and b) the [facility] team listed Log into [email or web address] and use [facility]'s universal password to access. If you do not have the password, please contact the hospital operator. Locate the [facility] provider you are looking for under [facility] and page to a number that you can be directly reached. If you still have difficulty reaching the provider, please page the [facility] (Director on Call) for the Hospitalists listed on amion for assistance.  03/20/2024, 6:50 PM       [1]  Allergies Allergen Reactions   Tape Other (See Comments)    Skin tears and  bruises easily.   Vioxx [Rofecoxib] Other (See Comments)    Elevated LFT's   "

## 2024-03-20 NOTE — ED Triage Notes (Signed)
 BIB [facility] from [facility]. Facility states that she fell at 5am. EMS reports they were unable to give a time when she was found. Unsure if she hit her head she is confused. Denies pain. Facility told EMS she is more altered than usual.  EMS reports that she was A-fib on the monitor with HR 80-120 mostly staying in the mid 80's. She also has bruising to her right eye from a previous fall on 12/31.   A&Ox1 to self. Neg stroke screen. Takes eliquis  for A-fib

## 2024-03-20 NOTE — ED Notes (Signed)
 Patients oxygen concentration drops into the 60's when she is asleep. Placed on oxygen while asleep.

## 2024-03-20 NOTE — ED Notes (Signed)
 Patient transported to CT

## 2024-03-21 ENCOUNTER — Observation Stay (HOSPITAL_COMMUNITY)

## 2024-03-21 ENCOUNTER — Encounter (HOSPITAL_COMMUNITY): Payer: Self-pay

## 2024-03-21 DIAGNOSIS — R569 Unspecified convulsions: Secondary | ICD-10-CM

## 2024-03-21 LAB — TSH: TSH: 1.07 u[IU]/mL (ref 0.350–4.500)

## 2024-03-21 LAB — AMMONIA: Ammonia: <13 umol/L (ref 9–35)

## 2024-03-21 LAB — COMPREHENSIVE METABOLIC PANEL WITH GFR
ALT: 37 U/L (ref 0–44)
AST: 42 U/L — ABNORMAL HIGH (ref 15–41)
Albumin: 4 g/dL (ref 3.5–5.0)
Alkaline Phosphatase: 120 U/L (ref 38–126)
Anion gap: 11 (ref 5–15)
BUN: 27 mg/dL — ABNORMAL HIGH (ref 8–23)
CO2: 23 mmol/L (ref 22–32)
Calcium: 9.2 mg/dL (ref 8.9–10.3)
Chloride: 102 mmol/L (ref 98–111)
Creatinine, Ser: 1.02 mg/dL — ABNORMAL HIGH (ref 0.44–1.00)
GFR, Estimated: 52 mL/min — ABNORMAL LOW
Glucose, Bld: 111 mg/dL — ABNORMAL HIGH (ref 70–99)
Potassium: 4.4 mmol/L (ref 3.5–5.1)
Sodium: 136 mmol/L (ref 135–145)
Total Bilirubin: 0.6 mg/dL (ref 0.0–1.2)
Total Protein: 6.2 g/dL — ABNORMAL LOW (ref 6.5–8.1)

## 2024-03-21 LAB — CBC
HCT: 37.2 % (ref 36.0–46.0)
Hemoglobin: 12.3 g/dL (ref 12.0–15.0)
MCH: 32.1 pg (ref 26.0–34.0)
MCHC: 33.1 g/dL (ref 30.0–36.0)
MCV: 97.1 fL (ref 80.0–100.0)
Platelets: 180 K/uL (ref 150–400)
RBC: 3.83 MIL/uL — ABNORMAL LOW (ref 3.87–5.11)
RDW: 13.3 % (ref 11.5–15.5)
WBC: 10.2 K/uL (ref 4.0–10.5)
nRBC: 0 % (ref 0.0–0.2)

## 2024-03-21 LAB — VITAMIN B12: Vitamin B-12: 786 pg/mL (ref 180–914)

## 2024-03-21 MED ORDER — LACOSAMIDE 50 MG PO TABS: 100.0000 mg | ORAL_TABLET | Freq: Two times a day (BID) | ORAL | Status: DC

## 2024-03-21 MED ORDER — LACOSAMIDE 50 MG PO TABS
100.0000 mg | ORAL_TABLET | Freq: Two times a day (BID) | ORAL | Status: DC
  Administered 2024-03-21 – 2024-03-25 (×9): 100 mg via ORAL
  Filled 2024-03-21 (×9): qty 2

## 2024-03-21 NOTE — Discharge Planning (Signed)
 Transition of Care [facility]) - Inpatient Brief Assessment   Patient Details  Name: Theresa [name] MRN: [id number] Date of Birth: 1931/08/22  [facility] of [facility]) CM/SW Contact:    Debarah Saunas, RN Phone Number: 03/21/2024, 10:17 AM   Clinical Narrative: Inpatient Care Management (ICM) has reviewed patient at bedside and no ICM needs have been identified at this time. We will continue to monitor patient advancement through interdisciplinary progression rounds. If new patient transition needs arise, please place a ICM consult.      Transition of Care Asessment: Insurance and Status: Insurance coverage has been reviewed Patient has primary care physician: Yes Home environment has been reviewed: from [facility] (assisted living) Prior level of function:: uses walker for ambulation, minimal assistance with ADL Prior/Current Home Services: No current home services Social Drivers of Health Review: SDOH reviewed no interventions necessary Readmission risk has been reviewed: Yes Transition of care needs: no transition of care needs at this time

## 2024-03-21 NOTE — ED Notes (Signed)
 EEG @ bedside.

## 2024-03-21 NOTE — Care Management Obs Status (Signed)
 MEDICARE OBSERVATION STATUS NOTIFICATION   Patient Details  Name: Theresa [name] MRN: [id number] Date of Birth: 1932-02-21   Medicare Observation Status Notification Given:  Yes    Debarah Saunas, RN 03/21/2024, 10:10 AM

## 2024-03-21 NOTE — Plan of Care (Signed)
   Problem: Education: Goal: Knowledge of General Education information will improve Description: Including pain rating scale, medication(s)/side effects and non-pharmacologic comfort measures Outcome: Progressing   Problem: Activity: Goal: Risk for activity intolerance will decrease Outcome: Progressing

## 2024-03-21 NOTE — Progress Notes (Signed)
 EEG complete - results pending

## 2024-03-21 NOTE — Progress Notes (Signed)
 Attempted to complete admission, but patient is disoriented and confused. Contact [name], but she notified me that she is no longer her healthcare power of attorney. This nurse contacted her care facility, [facility] [address] on [address], and the social worker, Darice Lunger, informed me of who her current [facility] POA is. Her name is Theresa [name] and she is the current [facility] POA through [employer] [phone]. This nurse attempted to contact Mrs. [name], but got no answer.

## 2024-03-21 NOTE — Progress Notes (Signed)
 " PROGRESS NOTE    Theresa [name]  FMW:[id number] DOB: 22-Jun-1931 DOA: 03/20/2024 PCP: Charlanne Fredia CROME, MD   Brief Narrative:   Theresa [name] is a 89 y.o. female with medical history significant of hypertension, hyperlipidemia, TIA, CVA, hypothyroidism, atrial fibrillation, GERD, CKD 3, depression, seizure disorder, mild cognitive impairment presenting after a fall and altered mental status at SNF.  Reportedly she has been having increasing confusion in the past 24 hours.  Patient has had increased falls for the past month.  Patient denies fevers, chills, chest pain, shortness of breath, abdominal pain, constipation, diarrhea, nausea, vomiting.   ED Course: Vital signs in the ED notable for blood pressure in the 150s-160s systolic.  Lab workup included CMP with BUN 28, creatinine 1.11, glucose 125, AST 65, ALT 46, alk phos 147.  CBC with leukocytosis of 12.7.  Respiratory panel for flu COVID RSV negative.  Urinalysis with protein, leukocytes, no bacteria.  Ammonia negative. Chest x-ray, pelvis x-ray, CT head and CT cervical spine negative for acute abnormalities  MRI brain is also negative for any acute findings. Patient admitted for further management   Subjective: Patient seen and examined at the bedside.  She was agitated when asked basic questions.  Says why are you all asking the same questions.  She denied having falls.  Vital signs are stable.   Assessment & Plan:   Principal Problem:   Acute encephalopathy Active Problems:   Paroxysmal atrial fibrillation (HCC)   Hypothyroidism   GASTROESOPHAGEAL REFLUX DISEASE, MILD   Hypertension   History of TIA (transient ischemic attack) and stroke   CKD (chronic kidney disease), stage III (HCC)   Hyperlipidemia   Depression   MCI (mild cognitive impairment)   Seizure disorder (HCC)   Acute cephalopathy Rule out CVA History of TIA/CVA Seizure disorder > Patient presenting with decline in mental status over the last 24  hours with new word finding difficulty and disorientation.  No other focal deficits noted though some portions of the exam limited by patient's participation/ability to follow directions. > History of prior CVA and TIA.  Baseline mild cognitive impairment per chart.  Though currently lives in ALF and participates in the discussions about the care for her husband who is in the SNF portion of the facility.  History of seizure disorder.  CT head and CT C-spine without acute abnormality.  MRI brain is negative. > EEG negative for epileptiform activities,/ > No evidence of infection on chest x-ray or urinalysis.  Does have mild leukocytosis 12.7 however.   -TSH, vitamin B12, ammonia within normal limits. --Continue telemetry monitoring -PT/OT/SLP eval -  Hypertension - Resume metoprolol .   Hyperlipidemia - Continue rosuvastatin    Hypothyroidism - Continue home Synthroid  - Checking TSH as above   CKD 3 > Creatinine stable - Trend renal function and electrolytes   Depression - Continue home Lexapro    Atrial fibrillation - Holding metoprolol  - Continue home Eliquis    DVT prophylaxis:      Eliquis  Code Status:              DNR.   Disposition Plan:              Patient is from:                        SNF             Anticipated DC to:  Same as above             Anticipated DC date:               1 to 3 days             Anticipated DC barriers:         None    Consults called:        None  Admission status:     Observation, telemetry   Severity of Illness: The appropriate patient status for this patient is OBSERVATION. Observation status is judged to be reasonable and necessary in order to provide the required intensity of service to ensure the patient's safety. The patient's presenting symptoms, physical exam findings, and initial radiographic and laboratory data in the context of their medical condition is felt to place them at decreased risk for further clinical  deterioration. Furthermore, it is anticipated that the patient will be medically stable for discharge from the hospital within 2 midnights of admission.   Objective: Vitals:   03/21/24 1057 03/21/24 1100 03/21/24 1314 03/21/24 1418  BP:  (!) 168/84  (!) 137/99  Pulse: 70 80  (!) 101  Resp: 18 12  18   Temp:   98 F (36.7 C) 98.2 F (36.8 C)  TempSrc:    Oral  SpO2: 94% 100%  93%   No intake or output data in the 24 hours ending 03/21/24 1425 There were no vitals filed for this visit.  Examination:  General: Alert, irritated. HEENT: Right frontal bruise Chest: Clear to auscultation bilaterally, no wheezing CVS: S1, S2, no murmur, regular rhythm Abdomen: Soft, nontender no organomegaly Extremities: No edema Neuro: PERRLA, EOMI, moving all extremities.   Data Reviewed: I have personally reviewed following labs and imaging studies  CBC: Recent Labs  Lab 03/20/24 1355 03/20/24 1408 03/21/24 0345  WBC 12.7*  --  10.2  HGB 13.7 11.9* 12.3  HCT 42.6 35.0* 37.2  MCV 97.5  --  97.1  PLT 202  --  180   Basic Metabolic Panel: Recent Labs  Lab 03/20/24 1355 03/20/24 1408 03/21/24 0345  NA 137 141 136  K 5.1 4.3 4.4  CL 102 106 102  CO2 25  --  23  GLUCOSE 125* 108* 111*  BUN 28* 27* 27*  CREATININE 1.11* 1.00 1.02*  CALCIUM  9.5  --  9.2  MG 2.1  --   --    GFR: Estimated Creatinine Clearance: 26.3 mL/min (A) (by C-G formula based on SCr of 1.02 mg/dL (H)). Liver Function Tests: Recent Labs  Lab 03/20/24 1355 03/21/24 0345  AST 65* 42*  ALT 46* 37  ALKPHOS 147* 120  BILITOT 0.5 0.6  PROT 7.1 6.2*  ALBUMIN 4.6 4.0   No results for input(s): LIPASE, AMYLASE in the last 168 hours. Recent Labs  Lab 03/21/24 0345  AMMONIA <13   Coagulation Profile: No results for input(s): INR, PROTIME in the last 168 hours. Cardiac Enzymes: No results for input(s): CKTOTAL, CKMB, CKMBINDEX, TROPONINI in the last 168 hours. BNP (last 3 results) No results  for input(s): PROBNP in the last 8760 hours. HbA1C: No results for input(s): HGBA1C in the last 72 hours. CBG: No results for input(s): GLUCAP in the last 168 hours. Lipid Profile: No results for input(s): CHOL, HDL, LDLCALC, TRIG, CHOLHDL, LDLDIRECT in the last 72 hours. Thyroid  Function Tests: Recent Labs    03/21/24 0345  TSH 1.070   Anemia Panel: Recent Labs    03/21/24 0345  VITAMINB12 786   Sepsis Labs: No results for input(s): PROCALCITON, LATICACIDVEN in the last 168 hours.  Recent Results (from the past 240 hours)  Resp panel by RT-PCR (RSV, Flu A&B, Covid) Anterior Nasal Swab     Status: None   Collection Time: 03/20/24  1:55 PM   Specimen: Anterior Nasal Swab  Result Value Ref Range Status   SARS Coronavirus 2 by RT PCR NEGATIVE NEGATIVE Final   Influenza A by PCR NEGATIVE NEGATIVE Final   Influenza B by PCR NEGATIVE NEGATIVE Final    Comment: (NOTE) The Xpert Xpress SARS-CoV-2/FLU/RSV plus assay is intended as an aid in the diagnosis of influenza from Nasopharyngeal swab specimens and should not be used as a sole basis for treatment. Nasal washings and aspirates are unacceptable for Xpert Xpress SARS-CoV-2/FLU/RSV testing.  Fact Sheet for Patients: bloggercourse.com  Fact Sheet for Healthcare Providers: seriousbroker.it  This test is not yet approved or cleared by the [place]  FDA and has been authorized for detection and/or diagnosis of SARS-CoV-2 by FDA under an Emergency Use Authorization (EUA). This EUA will remain in effect (meaning this test can be used) for the duration of the COVID-19 declaration under Section 564(b)(1) of the Act, 21 U.S.C. section 360bbb-3(b)(1), unless the authorization is terminated or revoked.     Resp Syncytial Virus by PCR NEGATIVE NEGATIVE Final    Comment: (NOTE) Fact Sheet for Patients: bloggercourse.com  Fact Sheet  for Healthcare Providers: seriousbroker.it  This test is not yet approved or cleared by the [place]  FDA and has been authorized for detection and/or diagnosis of SARS-CoV-2 by FDA under an Emergency Use Authorization (EUA). This EUA will remain in effect (meaning this test can be used) for the duration of the COVID-19 declaration under Section 564(b)(1) of the Act, 21 U.S.C. section 360bbb-3(b)(1), unless the authorization is terminated or revoked.  Performed at [facility], [address] [place], [place] [address]   Respiratory (~20 pathogens) panel by PCR     Status: None   Collection Time: 03/20/24  2:31 PM   Specimen: Nasopharyngeal Swab; Respiratory  Result Value Ref Range Status   Adenovirus NOT DETECTED NOT DETECTED Final   Coronavirus 229E NOT DETECTED NOT DETECTED Final    Comment: (NOTE) The Coronavirus on the Respiratory Panel, DOES NOT test for the novel  Coronavirus (2019 nCoV)    Coronavirus HKU1 NOT DETECTED NOT DETECTED Final   Coronavirus NL63 NOT DETECTED NOT DETECTED Final   Coronavirus OC43 NOT DETECTED NOT DETECTED Final   Metapneumovirus NOT DETECTED NOT DETECTED Final   Rhinovirus / Enterovirus NOT DETECTED NOT DETECTED Final   Influenza A NOT DETECTED NOT DETECTED Final   Influenza B NOT DETECTED NOT DETECTED Final   Parainfluenza Virus 1 NOT DETECTED NOT DETECTED Final   Parainfluenza Virus 2 NOT DETECTED NOT DETECTED Final   Parainfluenza Virus 3 NOT DETECTED NOT DETECTED Final   Parainfluenza Virus 4 NOT DETECTED NOT DETECTED Final   Respiratory Syncytial Virus NOT DETECTED NOT DETECTED Final   Bordetella pertussis NOT DETECTED NOT DETECTED Final   Bordetella Parapertussis NOT DETECTED NOT DETECTED Final   Chlamydophila pneumoniae NOT DETECTED NOT DETECTED Final   Mycoplasma pneumoniae NOT DETECTED NOT DETECTED Final    Comment: Performed at [facility], [address], [place], [place] [address]          Radiology Studies: EEG adult Result Date: 03/21/2024 Shelton Arlin KIDD, MD     03/21/2024  9:33 AM Patient  Name: Theresa [name] MRN: [id number] Epilepsy Attending: Arlin MALVA Krebs Referring Physician/Provider: Seena Marsa NOVAK, MD Date: 03/21/2024 Duration: 22.27 mins Patient history: 89yo F with decline in mental status over the last 24 hours with new word finding difficulty and disorientation. EEG to evaluate for seizure Level of alertness: Awake, drowsy AEDs during EEG study: None Technical aspects: This EEG study was done with scalp electrodes positioned according to the 10-20 International system of electrode placement. Electrical activity was reviewed with band pass filter of 1-70Hz , sensitivity of 7 uV/mm, display speed of 5mm/sec with a 60Hz  notched filter applied as appropriate. EEG data were recorded continuously and digitally stored.  Video monitoring was available and reviewed as appropriate. Description: The posterior dominant rhythm consists of 8 Hz activity of moderate voltage (25-35 uV) seen predominantly in posterior head regions, symmetric and reactive to eye opening and eye closing. Drowsiness was characterized by attenuation of the posterior background rhythm. EEG showed intermittent generalized 3-6hz  theta-delta slowing admixed with 15 to 18 Hz beta activity distributed symmetrically and diffusely. Hyperventilation and photic stimulation were not performed.   ABNORMALITY - Intermittent slow, generalized  IMPRESSION: This study is suggestive of mild diffuse encephalopathy. No seizures or epileptiform discharges were seen throughout the recording.  Arlin MALVA Krebs    MR BRAIN WO CONTRAST Result Date: 03/21/2024 EXAM: MRI BRAIN WITHOUT CONTRAST 03/21/2024 03:35:28 AM TECHNIQUE: Multiplanar multisequence MRI of the head/brain was performed without the administration of intravenous contrast. COMPARISON: CT of the head dated 03/20/2024 and MRI of the head dated 05/14/2023. CLINICAL  HISTORY: Neuro deficit, acute, stroke suspected. FINDINGS: BRAIN AND VENTRICLES: There is no restricted diffusion to indicate acute or recent infarction. No intracranial hemorrhage. No mass. No midline shift. No hydrocephalus. The sella is unremarkable. Normal flow voids. There are focal encephalomalacia changes within the left frontal operculum. There are gliotic changes within the left frontal and parietal lobes. Chronic lacunar infarcts are again noted within the basal ganglia bilaterally, the left thalamus, and bilateral cerebellar hemispheres. There is moderate periventricular and deep cerebral white matter disease. ORBITS: The patient is status post bilateral lens replacement. SINUSES AND MASTOIDS: No acute abnormality. BONES AND SOFT TISSUES: Normal marrow signal. No acute soft tissue abnormality. IMPRESSION: 1. No evidence of acute or recent infarction. 2. Focal encephalomalacia within the left frontal lobe with gliotic changes in the frontal and parietal lobes. 3. Chronic lacunar infarcts within the basal ganglia bilaterally, the left thalamus, and bilateral cerebellar hemispheres. 4. Moderate periventricular and deep cerebral white matter disease. Electronically signed by: Evalene Coho MD 03/21/2024 04:05 AM EST RP Workstation: [id number]   DG Abd 1 View Result Date: 03/21/2024 EXAM: 1 VIEW XRAY OF THE ABDOMEN 03/21/2024 01:39:00 AM COMPARISON: None available. CLINICAL HISTORY: Encounter for imaging to screen for metal prior to MRI. FINDINGS: BOWEL: Nonobstructive bowel gas pattern. SOFT TISSUES: Residual contrast material in the renal collecting system and bladder. Surgical clips in the right upper quadrant. Cardiac valve prosthesis. No radiopaque foreign bodies are demonstrated within the field of view, although residual contrast material could obscure foreign bodies in those areas. BONES: Postoperative changes with rod and screw fixation of the lumbar spine. Right hip hemiarthroplasty.  Degenerative changes in the lumbar spine. Scoliosis convex towards the right. IMPRESSION: 1. No radiopaque foreign bodies identified within the field of view, although residual contrast material could obscure foreign bodies. 2. Postsurgical metallic hardware including lumbar rod and screw fixation, right hip hemiarthroplasty, cardiac valve prosthesis, and right upper quadrant surgical clips. Electronically signed  by: Elsie Gravely MD 03/21/2024 01:46 AM EST RP Workstation: [id number]   CT Angio Chest PE W and/or Wo Contrast Result Date: 03/20/2024 EXAM: CTA CHEST 03/20/2024 11:23:05 PM TECHNIQUE: CTA of the chest was performed after the administration of intravenous contrast. Multiplanar reformatted images are provided for review. MIP images are provided for review. Automated exposure control, iterative reconstruction, and/or weight based adjustment of the mA/kV was utilized to reduce the radiation dose to as low as reasonably achievable. COMPARISON: None available. CLINICAL HISTORY: Pulmonary embolism (PE) suspected, high prob. FINDINGS: PULMONARY ARTERIES: Pulmonary arteries are adequately opacified for evaluation. No acute pulmonary embolus. Main pulmonary artery is normal in caliber. MEDIASTINUM: Cardiomegaly. Aortic atherosclerosis. The pericardium demonstrates no acute abnormality. There is no acute abnormality of the thoracic aorta. LYMPH NODES: No mediastinal, hilar or axillary lymphadenopathy. LUNGS AND PLEURA: Mosaic attenuation throughout the lungs compatible with air trapping. Peripheral ground glass opacities in the lower lobes, likely fibrosis. No acute confluent airspace opacities. No effusions. No pneumothorax. UPPER ABDOMEN: Limited images of the upper abdomen are unremarkable. SOFT TISSUES AND BONES: No acute bone or soft tissue abnormality. IMPRESSION: 1. No pulmonary embolism. 2. Mosaic attenuation throughout the lungs compatible with air trapping. 3. Peripheral ground glass opacities in the  lower lobes, likely fibrosis. 4. Cardiomegaly and aortic atherosclerosis. Electronically signed by: Franky Crease MD 03/20/2024 11:28 PM EST RP Workstation: [id number]   CT CERVICAL SPINE WO CONTRAST Result Date: 03/20/2024 CLINICAL DATA:  Trauma EXAM: CT CERVICAL SPINE WITHOUT CONTRAST TECHNIQUE: Multidetector CT imaging of the cervical spine was performed without intravenous contrast. Multiplanar CT image reconstructions were also generated. RADIATION DOSE REDUCTION: This exam was performed according to the departmental dose-optimization program which includes automated exposure control, adjustment of the mA and/or kV according to patient size and/or use of iterative reconstruction technique. COMPARISON:  CT 03/15/2024, 02/15/2024 FINDINGS: Alignment: Reversal of cervical lordosis as before. Trace anterolisthesis C3 on C4 and C7 on T1. Similar fused anterolisthesis C4 on C5. Stable facet alignment with chronic, fused slightly anterior positioning of the right C4 facet. Skull base and vertebrae: No acute fracture is seen. Interbody fusion C4-C5 and C5-C6 with facet ankylosis at C4-C5. Soft tissues and spinal canal: No prevertebral fluid or swelling. No visible canal hematoma. Disc levels: Interbody fusion C4 through C6. Advanced disc space narrowing and degenerative change C3-C4 and C6-C7. At least moderate spinal stenosis at C6-C7. Facet degenerative change at multiple levels with foraminal narrowing. Upper chest: Negative. Other: None IMPRESSION: 1. No acute osseous abnormality. 2. Similar reversal of cervical lordosis and degenerative changes as above. Suspected at least moderate spinal stenosis at C6-C7 Electronically Signed   By: Luke Bun M.D.   On: 03/20/2024 15:23   CT HEAD WO CONTRAST Result Date: 03/20/2024 CLINICAL DATA:  Head trauma EXAM: CT HEAD WITHOUT CONTRAST TECHNIQUE: Contiguous axial images were obtained from the base of the skull through the vertex without intravenous contrast. RADIATION  DOSE REDUCTION: This exam was performed according to the departmental dose-optimization program which includes automated exposure control, adjustment of the mA and/or kV according to patient size and/or use of iterative reconstruction technique. COMPARISON:  CT 03/15/2024, 02/15/2024 FINDINGS: Brain: No acute territorial infarction, hemorrhage or intracranial mass. Chronic left MCA infarct. Atrophy and chronic small vessel ischemic changes of the white matter. Small chronic cerebellar infarcts. Chronic lacunar infarcts in the basal ganglia. Stable ventricle size. Vascular: No hyperdense vessels.  Carotid vascular calcification Skull: No fracture Sinuses/Orbits: No acute finding. Other: Small right frontal scalp hematoma,  decreased compared to prior. IMPRESSION: 1. No CT evidence for acute intracranial abnormality. 2. Atrophy and chronic small vessel ischemic changes of the white matter. Chronic left MCA infarct and small chronic cerebellar and basal ganglial infarcts Electronically Signed   By: Luke Bun M.D.   On: 03/20/2024 15:11   DG Pelvis 1-2 Views Result Date: 03/20/2024 CLINICAL DATA:  [id number] Trauma [id number] EXAM: PELVIS - 1-2 VIEW COMPARISON:  March 03, 2023, February 15, 2024 FINDINGS: Osteopenia. No acute fracture or dislocation. Partial visualization of orthopedic hardware status post posterior fixation of the lower lumbar spine and RIGHT hip arthroplasty. Sacrum is obscured by overlapping bowel contents. IMPRESSION: 1. No acute fracture or dislocation. 2. If there is a persistent clinical concern for nondisplaced hip or pelvic fracture, recommend dedicated pelvic CT or MRI. Electronically Signed   By: Corean Salter M.D.   On: 03/20/2024 13:55   DG Chest 1 View Result Date: 03/20/2024 CLINICAL DATA:  [id number] Trauma [id number] EXAM: CHEST  1 VIEW COMPARISON:  February 15, 2024, March 03, 2023 FINDINGS: The cardiomediastinal silhouette is unchanged and enlarged in contour.Status post valve repair.  Atherosclerotic calcifications of the tortuous aorta. No pleural effusion. No pneumothorax. No acute pleuroparenchymal abnormality. Remote LEFT-sided rib fractures. IMPRESSION: No acute cardiopulmonary abnormality. Electronically Signed   By: Corean Salter M.D.   On: 03/20/2024 13:53        Scheduled Meds:  apixaban   2.5 mg Oral BID   lacosamide   100 mg Oral BID   levothyroxine   75 mcg Oral Q0600   rosuvastatin   10 mg Oral Daily   sodium chloride  flush  3 mL Intravenous Q12H   Continuous Infusions:        Derryl Duval, MD [facility] 03/21/2024, 2:25 PM   "

## 2024-03-21 NOTE — ED Notes (Signed)
 Pt ambulated to bathroom. Pt Denies Dizziness. Steady gait

## 2024-03-21 NOTE — Procedures (Signed)
 Patient Name: Theresa [name]  MRN: [id number]  Epilepsy Attending: Arlin MALVA Krebs  Referring Physician/Provider: Seena Marsa NOVAK, MD  Date: 03/21/2024 Duration: 22.27 mins  Patient history: 89yo F with decline in mental status over the last 24 hours with new word finding difficulty and disorientation. EEG to evaluate for seizure  Level of alertness: Awake, drowsy  AEDs during EEG study: None  Technical aspects: This EEG study was done with scalp electrodes positioned according to the 10-20 International system of electrode placement. Electrical activity was reviewed with band pass filter of 1-70Hz , sensitivity of 7 uV/mm, display speed of 75mm/sec with a 60Hz  notched filter applied as appropriate. EEG data were recorded continuously and digitally stored.  Video monitoring was available and reviewed as appropriate.  Description: The posterior dominant rhythm consists of 8 Hz activity of moderate voltage (25-35 uV) seen predominantly in posterior head regions, symmetric and reactive to eye opening and eye closing. Drowsiness was characterized by attenuation of the posterior background rhythm. EEG showed intermittent generalized 3-6hz  theta-delta slowing admixed with 15 to 18 Hz beta activity distributed symmetrically and diffusely. Hyperventilation and photic stimulation were not performed.     ABNORMALITY - Intermittent slow, generalized   IMPRESSION: This study is suggestive of mild diffuse encephalopathy. No seizures or epileptiform discharges were seen throughout the recording.   Chirstine Defrain O Joletta Manner

## 2024-03-22 MED ORDER — MELATONIN 5 MG PO TABS
5.0000 mg | ORAL_TABLET | Freq: Once | ORAL | Status: AC
  Administered 2024-03-22: 5 mg via ORAL
  Filled 2024-03-22: qty 1

## 2024-03-22 MED ORDER — METOPROLOL TARTRATE 25 MG PO TABS
25.0000 mg | ORAL_TABLET | Freq: Two times a day (BID) | ORAL | Status: DC
  Administered 2024-03-22 – 2024-03-25 (×7): 25 mg via ORAL
  Filled 2024-03-22: qty 2
  Filled 2024-03-22 (×6): qty 1

## 2024-03-22 NOTE — Plan of Care (Signed)

## 2024-03-22 NOTE — Progress Notes (Signed)
 " PROGRESS NOTE    Theresa [name]  FMW:[id number] DOB: 1931/06/28 DOA: 03/20/2024 PCP: Charlanne Fredia CROME, MD   Brief Narrative:  This 89 y.o. female with medical history significant of hypertension, hyperlipidemia, TIA, CVA, hypothyroidism, atrial fibrillation, GERD, CKD 3, depression, seizure disorder, mild cognitive impairment presenting after a fall and altered mental status at SNF.  Reportedly she has been having increasing confusion in the past 24 hours.  Patient has had increased falls for the past month.  Lab workup included CMP with BUN 28, creatinine 1.11, glucose 125, AST 65, ALT 46, alk phos 147.  CBC with leukocytosis of 12.7.  Respiratory panel for flu COVID RSV negative.  Urinalysis with protein, leukocytes, no bacteria.  Ammonia negative. Chest x-ray, pelvis x-ray, CT head and CT cervical spine negative for acute abnormalities  MRI brain is also negative for any acute findings. Patient admitted for further management  Assessment & Plan:   Principal Problem:   Acute encephalopathy Active Problems:   Paroxysmal atrial fibrillation (HCC)   Hypothyroidism   GASTROESOPHAGEAL REFLUX DISEASE, MILD   Hypertension   History of TIA (transient ischemic attack) and stroke   CKD (chronic kidney disease), stage III (HCC)   Hyperlipidemia   Depression   MCI (mild cognitive impairment)   Seizure disorder (HCC)  Acute encephalopathy CVA ruled out. History of TIA/CVA Seizure disorder Patient presented with altered mental status over the last 24 hours with new word finding difficulty and disorientation.  No other focal deficits noted on the exam . She has history of prior CVA and TIA.  Baseline mild cognitive impairment per chart.  Though currently lives in ALF and participates in the discussions about the care for her husband who is in the SNF portion of the facility.  History of seizure disorder.  CT head and CT C-spine without acute abnormality.  MRI brain is negative. EEG negative  for epileptiform activities. No evidence of infection on chest x-ray or urinalysis.  Does have mild leukocytosis 12.7 however.   TSH, vitamin B12, ammonia within normal limits. Continue telemetry monitoring. PT/OT/SLP eval  Hypertension: Continue metoprolol  25 mg twice daily.   Hyperlipidemia: Continue rosuvastatin    Hypothyroidism: Continue home Synthroid .  CKD 3: Creatinine stable Trend renal function and electrolytes.   Depression: Continue home Lexapro .   Atrial fibrillation: Continue metoprolol . Continue home Eliquis .   DVT prophylaxis: Eliquis  Code Status: DNR Family Communication: No family at bed side. Disposition Plan:     Status is: Observation The patient remains OBS appropriate and will d/c before 2 midnights.  Patient admitted for altered mental status, multifactorial.  Stroke workup has been in progress.  Patient is not medically ready for discharge yet.  Consultants:  None  Procedures: CT , MRI  Antimicrobials:  Anti-infectives (From admission, onward)    None      Subjective: Patient was seen and examined at bedside.  Overnight events noted.  Patient seems much improved she is alert and oriented following commands.  Objective: Vitals:   03/21/24 2014 03/22/24 0534 03/22/24 0714 03/22/24 1427  BP: (!) 153/98 (!) 173/109 (!) 169/97 (!) 164/98  Pulse:  82 (!) 103 (!) 109  Resp: 20 15 16 16   Temp: 97.8 F (36.6 C) 97.8 F (36.6 C) 98.1 F (36.7 C) 98.2 F (36.8 C)  TempSrc: Oral  Oral Oral  SpO2: 94% 93% 99% 97%  Weight:      Height:       No intake or output data in the 24 hours  ending 03/22/24 1525 Filed Weights   03/21/24 1500  Weight: 49 kg    Examination:  General exam: Appears calm and comfortable,  deconditioned, not in any acute distress Respiratory system: CTA Bilaterally . Respiratory effort normal. RR 14 Cardiovascular system: S1 & S2 heard, RRR. No JVD, murmurs, rubs, gallops or clicks.  Gastrointestinal system:  Abdomen is non distended, soft and non tender. Normal bowel sounds heard. Central nervous system: Alert and oriented x 3. No focal neurological deficits. Extremities: No edema, no cyanosis, no clubbing. Skin: No rashes, lesions or ulcers Psychiatry: Judgement and insight appear normal. Mood & affect appropriate.     Data Reviewed: I have personally reviewed following labs and imaging studies  CBC: Recent Labs  Lab 03/20/24 1355 03/20/24 1408 03/21/24 0345  WBC 12.7*  --  10.2  HGB 13.7 11.9* 12.3  HCT 42.6 35.0* 37.2  MCV 97.5  --  97.1  PLT 202  --  180   Basic Metabolic Panel: Recent Labs  Lab 03/20/24 1355 03/20/24 1408 03/21/24 0345  NA 137 141 136  K 5.1 4.3 4.4  CL 102 106 102  CO2 25  --  23  GLUCOSE 125* 108* 111*  BUN 28* 27* 27*  CREATININE 1.11* 1.00 1.02*  CALCIUM  9.5  --  9.2  MG 2.1  --   --    GFR: Estimated Creatinine Clearance: 27.7 mL/min (A) (by C-G formula based on SCr of 1.02 mg/dL (H)). Liver Function Tests: Recent Labs  Lab 03/20/24 1355 03/21/24 0345  AST 65* 42*  ALT 46* 37  ALKPHOS 147* 120  BILITOT 0.5 0.6  PROT 7.1 6.2*  ALBUMIN 4.6 4.0   No results for input(s): LIPASE, AMYLASE in the last 168 hours. Recent Labs  Lab 03/21/24 0345  AMMONIA <13   Coagulation Profile: No results for input(s): INR, PROTIME in the last 168 hours. Cardiac Enzymes: No results for input(s): CKTOTAL, CKMB, CKMBINDEX, TROPONINI in the last 168 hours. BNP (last 3 results) No results for input(s): PROBNP in the last 8760 hours. HbA1C: No results for input(s): HGBA1C in the last 72 hours. CBG: No results for input(s): GLUCAP in the last 168 hours. Lipid Profile: No results for input(s): CHOL, HDL, LDLCALC, TRIG, CHOLHDL, LDLDIRECT in the last 72 hours. Thyroid  Function Tests: Recent Labs    03/21/24 0345  TSH 1.070   Anemia Panel: Recent Labs    03/21/24 0345  VITAMINB12 786   Sepsis Labs: No results  for input(s): PROCALCITON, LATICACIDVEN in the last 168 hours.  Recent Results (from the past 240 hours)  Resp panel by RT-PCR (RSV, Flu A&B, Covid) Anterior Nasal Swab     Status: None   Collection Time: 03/20/24  1:55 PM   Specimen: Anterior Nasal Swab  Result Value Ref Range Status   SARS Coronavirus 2 by RT PCR NEGATIVE NEGATIVE Final   Influenza A by PCR NEGATIVE NEGATIVE Final   Influenza B by PCR NEGATIVE NEGATIVE Final    Comment: (NOTE) The Xpert Xpress SARS-CoV-2/FLU/RSV plus assay is intended as an aid in the diagnosis of influenza from Nasopharyngeal swab specimens and should not be used as a sole basis for treatment. Nasal washings and aspirates are unacceptable for Xpert Xpress SARS-CoV-2/FLU/RSV testing.  Fact Sheet for Patients: bloggercourse.com  Fact Sheet for Healthcare Providers: seriousbroker.it  This test is not yet approved or cleared by the [place]  FDA and has been authorized for detection and/or diagnosis of SARS-CoV-2 by FDA under an Emergency Use Authorization (  EUA). This EUA will remain in effect (meaning this test can be used) for the duration of the COVID-19 declaration under Section 564(b)(1) of the Act, 21 U.S.C. section 360bbb-3(b)(1), unless the authorization is terminated or revoked.     Resp Syncytial Virus by PCR NEGATIVE NEGATIVE Final    Comment: (NOTE) Fact Sheet for Patients: bloggercourse.com  Fact Sheet for Healthcare Providers: seriousbroker.it  This test is not yet approved or cleared by the [place]  FDA and has been authorized for detection and/or diagnosis of SARS-CoV-2 by FDA under an Emergency Use Authorization (EUA). This EUA will remain in effect (meaning this test can be used) for the duration of the COVID-19 declaration under Section 564(b)(1) of the Act, 21 U.S.C. section 360bbb-3(b)(1), unless the  authorization is terminated or revoked.  Performed at [facility], [address] [place], [place] [address]   Respiratory (~20 pathogens) panel by PCR     Status: None   Collection Time: 03/20/24  2:31 PM   Specimen: Nasopharyngeal Swab; Respiratory  Result Value Ref Range Status   Adenovirus NOT DETECTED NOT DETECTED Final   Coronavirus 229E NOT DETECTED NOT DETECTED Final    Comment: (NOTE) The Coronavirus on the Respiratory Panel, DOES NOT test for the novel  Coronavirus (2019 nCoV)    Coronavirus HKU1 NOT DETECTED NOT DETECTED Final   Coronavirus NL63 NOT DETECTED NOT DETECTED Final   Coronavirus OC43 NOT DETECTED NOT DETECTED Final   Metapneumovirus NOT DETECTED NOT DETECTED Final   Rhinovirus / Enterovirus NOT DETECTED NOT DETECTED Final   Influenza A NOT DETECTED NOT DETECTED Final   Influenza B NOT DETECTED NOT DETECTED Final   Parainfluenza Virus 1 NOT DETECTED NOT DETECTED Final   Parainfluenza Virus 2 NOT DETECTED NOT DETECTED Final   Parainfluenza Virus 3 NOT DETECTED NOT DETECTED Final   Parainfluenza Virus 4 NOT DETECTED NOT DETECTED Final   Respiratory Syncytial Virus NOT DETECTED NOT DETECTED Final   Bordetella pertussis NOT DETECTED NOT DETECTED Final   Bordetella Parapertussis NOT DETECTED NOT DETECTED Final   Chlamydophila pneumoniae NOT DETECTED NOT DETECTED Final   Mycoplasma pneumoniae NOT DETECTED NOT DETECTED Final    Comment: Performed at [facility], [address] [place], [place] [address]         Radiology Studies: EEG adult Result Date: 03/21/2024 Shelton Arlin KIDD, MD     03/21/2024  9:33 AM Patient Name: Cosandra Plouffe MRN: [id number] Epilepsy Attending: Arlin KIDD Shelton Referring Physician/Provider: Seena Marsa NOVAK, MD Date: 03/21/2024 Duration: 22.27 mins Patient history: 89yo F with decline in mental status over the last 24 hours with new word finding difficulty and disorientation. EEG to evaluate for seizure Level of  alertness: Awake, drowsy AEDs during EEG study: None Technical aspects: This EEG study was done with scalp electrodes positioned according to the 10-20 International system of electrode placement. Electrical activity was reviewed with band pass filter of 1-70Hz , sensitivity of 7 uV/mm, display speed of 68mm/sec with a 60Hz  notched filter applied as appropriate. EEG data were recorded continuously and digitally stored.  Video monitoring was available and reviewed as appropriate. Description: The posterior dominant rhythm consists of 8 Hz activity of moderate voltage (25-35 uV) seen predominantly in posterior head regions, symmetric and reactive to eye opening and eye closing. Drowsiness was characterized by attenuation of the posterior background rhythm. EEG showed intermittent generalized 3-6hz  theta-delta slowing admixed with 15 to 18 Hz beta activity distributed symmetrically and diffusely. Hyperventilation and photic  stimulation were not performed.   ABNORMALITY - Intermittent slow, generalized  IMPRESSION: This study is suggestive of mild diffuse encephalopathy. No seizures or epileptiform discharges were seen throughout the recording.  Arlin MALVA Krebs    MR BRAIN WO CONTRAST Result Date: 03/21/2024 EXAM: MRI BRAIN WITHOUT CONTRAST 03/21/2024 03:35:28 AM TECHNIQUE: Multiplanar multisequence MRI of the head/brain was performed without the administration of intravenous contrast. COMPARISON: CT of the head dated 03/20/2024 and MRI of the head dated 05/14/2023. CLINICAL HISTORY: Neuro deficit, acute, stroke suspected. FINDINGS: BRAIN AND VENTRICLES: There is no restricted diffusion to indicate acute or recent infarction. No intracranial hemorrhage. No mass. No midline shift. No hydrocephalus. The sella is unremarkable. Normal flow voids. There are focal encephalomalacia changes within the left frontal operculum. There are gliotic changes within the left frontal and parietal lobes. Chronic lacunar infarcts are  again noted within the basal ganglia bilaterally, the left thalamus, and bilateral cerebellar hemispheres. There is moderate periventricular and deep cerebral white matter disease. ORBITS: The patient is status post bilateral lens replacement. SINUSES AND MASTOIDS: No acute abnormality. BONES AND SOFT TISSUES: Normal marrow signal. No acute soft tissue abnormality. IMPRESSION: 1. No evidence of acute or recent infarction. 2. Focal encephalomalacia within the left frontal lobe with gliotic changes in the frontal and parietal lobes. 3. Chronic lacunar infarcts within the basal ganglia bilaterally, the left thalamus, and bilateral cerebellar hemispheres. 4. Moderate periventricular and deep cerebral white matter disease. Electronically signed by: Evalene Coho MD 03/21/2024 04:05 AM EST RP Workstation: [id number]   DG Abd 1 View Result Date: 03/21/2024 EXAM: 1 VIEW XRAY OF THE ABDOMEN 03/21/2024 01:39:00 AM COMPARISON: None available. CLINICAL HISTORY: Encounter for imaging to screen for metal prior to MRI. FINDINGS: BOWEL: Nonobstructive bowel gas pattern. SOFT TISSUES: Residual contrast material in the renal collecting system and bladder. Surgical clips in the right upper quadrant. Cardiac valve prosthesis. No radiopaque foreign bodies are demonstrated within the field of view, although residual contrast material could obscure foreign bodies in those areas. BONES: Postoperative changes with rod and screw fixation of the lumbar spine. Right hip hemiarthroplasty. Degenerative changes in the lumbar spine. Scoliosis convex towards the right. IMPRESSION: 1. No radiopaque foreign bodies identified within the field of view, although residual contrast material could obscure foreign bodies. 2. Postsurgical metallic hardware including lumbar rod and screw fixation, right hip hemiarthroplasty, cardiac valve prosthesis, and right upper quadrant surgical clips. Electronically signed by: Elsie Gravely MD 03/21/2024 01:46 AM  EST RP Workstation: [id number]   CT Angio Chest PE W and/or Wo Contrast Result Date: 03/20/2024 EXAM: CTA CHEST 03/20/2024 11:23:05 PM TECHNIQUE: CTA of the chest was performed after the administration of intravenous contrast. Multiplanar reformatted images are provided for review. MIP images are provided for review. Automated exposure control, iterative reconstruction, and/or weight based adjustment of the mA/kV was utilized to reduce the radiation dose to as low as reasonably achievable. COMPARISON: None available. CLINICAL HISTORY: Pulmonary embolism (PE) suspected, high prob. FINDINGS: PULMONARY ARTERIES: Pulmonary arteries are adequately opacified for evaluation. No acute pulmonary embolus. Main pulmonary artery is normal in caliber. MEDIASTINUM: Cardiomegaly. Aortic atherosclerosis. The pericardium demonstrates no acute abnormality. There is no acute abnormality of the thoracic aorta. LYMPH NODES: No mediastinal, hilar or axillary lymphadenopathy. LUNGS AND PLEURA: Mosaic attenuation throughout the lungs compatible with air trapping. Peripheral ground glass opacities in the lower lobes, likely fibrosis. No acute confluent airspace opacities. No effusions. No pneumothorax. UPPER ABDOMEN: Limited images of the upper abdomen are unremarkable. SOFT TISSUES  AND BONES: No acute bone or soft tissue abnormality. IMPRESSION: 1. No pulmonary embolism. 2. Mosaic attenuation throughout the lungs compatible with air trapping. 3. Peripheral ground glass opacities in the lower lobes, likely fibrosis. 4. Cardiomegaly and aortic atherosclerosis. Electronically signed by: Franky Crease MD 03/20/2024 11:28 PM EST RP Workstation: [id number]   Scheduled Meds:  apixaban   2.5 mg Oral BID   lacosamide   100 mg Oral BID   levothyroxine   75 mcg Oral Q0600   metoprolol  tartrate  25 mg Oral BID   rosuvastatin   10 mg Oral Daily   sodium chloride  flush  3 mL Intravenous Q12H   Continuous Infusions:   LOS: 0 days    Time  spent: 50 mins    Darcel Dawley, MD [facility]   If 7PM-7AM, please contact night-coverage  "

## 2024-03-22 NOTE — Plan of Care (Signed)
   Problem: Education: Goal: Knowledge of General Education information will improve Description: Including pain rating scale, medication(s)/side effects and non-pharmacologic comfort measures Outcome: Progressing   Problem: Activity: Goal: Risk for activity intolerance will decrease Outcome: Progressing   Problem: Nutrition: Goal: Adequate nutrition will be maintained Outcome: Progressing   Problem: Coping: Goal: Level of anxiety will decrease Outcome: Not Progressing

## 2024-03-23 DIAGNOSIS — E039 Hypothyroidism, unspecified: Secondary | ICD-10-CM

## 2024-03-23 DIAGNOSIS — N183 Chronic kidney disease, stage 3 unspecified: Secondary | ICD-10-CM | POA: Diagnosis not present

## 2024-03-23 DIAGNOSIS — G9340 Encephalopathy, unspecified: Secondary | ICD-10-CM | POA: Diagnosis not present

## 2024-03-23 DIAGNOSIS — Z8673 Personal history of transient ischemic attack (TIA), and cerebral infarction without residual deficits: Secondary | ICD-10-CM | POA: Diagnosis not present

## 2024-03-23 DIAGNOSIS — G40909 Epilepsy, unspecified, not intractable, without status epilepticus: Secondary | ICD-10-CM | POA: Diagnosis not present

## 2024-03-23 DIAGNOSIS — I480 Paroxysmal atrial fibrillation: Secondary | ICD-10-CM | POA: Diagnosis not present

## 2024-03-23 DIAGNOSIS — I10 Essential (primary) hypertension: Secondary | ICD-10-CM | POA: Diagnosis not present

## 2024-03-23 NOTE — Progress Notes (Signed)
 "        [facility]                                                                               Theresa [name], is a 89 y.o. female, DOB - 05/28/1931, FMW:[id number] Admit date - 03/20/2024    Outpatient Primary MD for the patient is Charlanne Fredia CROME, MD  LOS - 0  days    Brief summary    89 y.o. female with medical history significant of hypertension, hyperlipidemia, TIA, CVA, hypothyroidism, atrial fibrillation, GERD, CKD 3, depression, seizure disorder, mild cognitive impairment presenting after a fall and altered mental status at SNF.  Reportedly she has been having increasing confusion in the past 24 hours.  Patient has had increased falls for the past month.  Lab workup included CMP with BUN 28, creatinine 1.11, glucose 125, AST 65, ALT 46, alk phos 147.  CBC with leukocytosis of 12.7.  Respiratory panel for flu COVID RSV negative.  Urinalysis with protein, leukocytes, no bacteria.  Ammonia negative. Chest x-ray, pelvis x-ray, CT head and CT cervical spine negative for acute abnormalities  MRI brain is also negative for any acute findings. Patient admitted for further management    Assessment & Plan    Assessment and Plan:   Acute metabolic encephalopathy in the setting of TIA and seizure disorder No focal deficits noted on exam Patient has baseline cognitive impairment On admission patient underwent CT head, CT spine and MRI brain which were all negative for acute abnormality EEG is negative for new epileptiform activities. No source of infection at this time on chest x-ray or urinalysis TSH, vitamin B12 and ammonia within normal limits Suspect a component of dehydration and worsening of her cognitive impairment Therapy evaluations ordered and pending Essential hypertension Blood pressure parameters are optimal    Hypothyroidism TSH within normal limits, continue Synthroid   Stage IIIa CKD Creatinine at baseline and stable    Atrial fibrillation Rate  controlled on metoprolol , on Eliquis  for anticoagulation. In view of her recurrent falls and cognitive impairment recommend holding Eliquis  after discussing with her cardiologist outpatient.    Depression Continue with Lexapro     Hyperlipidemia Continue with statin   Leukocytosis Resolved     Estimated body mass index is 20.41 kg/m as calculated from the following:   Height as of this encounter: 5' 1 (1.549 m).   Weight as of this encounter: 49 kg.  Code Status: DNR limited.  DVT Prophylaxis:  apixaban  (ELIQUIS ) tablet 2.5 mg Start: 03/20/24 2200 apixaban  (ELIQUIS ) tablet 2.5 mg   Level of Care: Level of care: Med-Surg Family Communication: none at bedside.   Disposition Plan:     Remains inpatient appropriate:  pending further work up  Procedures:  None.   Consultants:   None.  Antimicrobials:   Anti-infectives (From admission, onward)    None        Medications  Scheduled Meds:  apixaban   2.5 mg Oral BID   lacosamide   100 mg Oral BID   levothyroxine   75 mcg Oral Q0600   metoprolol  tartrate  25 mg Oral BID   rosuvastatin   10 mg Oral Daily   sodium chloride  flush  3 mL Intravenous Q12H   Continuous Infusions: PRN Meds:.acetaminophen  **OR** acetaminophen , polyethylene glycol    Subjective:   Theresa [name] was seen and examined today.  Confused, getting out of bed, pulled out IV ,   Objective:   Vitals:   03/22/24 2013 03/23/24 0345 03/23/24 0723 03/23/24 1313  BP: (!) 151/88 (!) 142/87 (!) 162/77 (!) 159/94  Pulse: 93 87  100  Resp: 16 15 18 18   Temp: 98 F (36.7 C) 97.6 F (36.4 C) 98.9 F (37.2 C) 98 F (36.7 C)  TempSrc:      SpO2: 93% 96% 95% 91%  Weight:      Height:        Intake/Output Summary (Last 24 hours) at 03/23/2024 1534 Last data filed at 03/23/2024 0807 Gross per 24 hour  Intake 240 ml  Output --  Net 240 ml   Filed Weights   03/21/24 1500  Weight: 49 kg     Exam General exam: Appears calm and comfortable   Respiratory system: Clear to auscultation. Respiratory effort normal. Cardiovascular system: S1 & S2 heard, RRR.  Gastrointestinal system: Abdomen is nondistended, soft and nontender.  Central nervous system: Alert and oriented to person only. Confused.  Extremities: Symmetric 5 x 5 power. Skin: No rashes, Psychiatry: Patient is pleasantly confused   Data Reviewed:  I have personally reviewed following labs and imaging studies   CBC Lab Results  Component Value Date   WBC 10.2 03/21/2024   RBC 3.83 (L) 03/21/2024   HGB 12.3 03/21/2024   HCT 37.2 03/21/2024   MCV 97.1 03/21/2024   MCH 32.1 03/21/2024   PLT 180 03/21/2024   MCHC 33.1 03/21/2024   RDW 13.3 03/21/2024   LYMPHSABS 2.0 02/15/2024   MONOABS 0.4 02/15/2024   EOSABS 0.1 02/15/2024   BASOSABS 0.1 02/15/2024     Last metabolic panel Lab Results  Component Value Date   NA 136 03/21/2024   K 4.4 03/21/2024   CL 102 03/21/2024   CO2 23 03/21/2024   BUN 27 (H) 03/21/2024   CREATININE 1.02 (H) 03/21/2024   GLUCOSE 111 (H) 03/21/2024   GFRNONAA 52 (L) 03/21/2024   GFRAA 76 (L) 03/21/2013   CALCIUM  9.2 03/21/2024   PHOS 3.1 03/08/2023   PROT 6.2 (L) 03/21/2024   ALBUMIN 4.0 03/21/2024   BILITOT 0.6 03/21/2024   ALKPHOS 120 03/21/2024   AST 42 (H) 03/21/2024   ALT 37 03/21/2024   ANIONGAP 11 03/21/2024    CBG (last 3)  No results for input(s): GLUCAP in the last 72 hours.    Coagulation Profile: No results for input(s): INR, PROTIME in the last 168 hours.   Radiology Studies: No results found.     Elgie Butter M.D. [facility] 03/23/2024, 3:34 PM  Available via Epic secure chat 7am-7pm After 7 pm, please refer to night coverage provider listed on amion.    "

## 2024-03-23 NOTE — Progress Notes (Signed)
 Pt with multiple attempts to exit the bed overnight attempting to put the sheets up and call bill because he hit his head. Pt ripped out IV resulting estimated blood loss. I came into the room as she was holding her PIV to her face stating that it was eyedrops.

## 2024-03-23 NOTE — Plan of Care (Signed)
   Problem: Activity: Goal: Risk for activity intolerance will decrease Outcome: Progressing   Problem: Nutrition: Goal: Adequate nutrition will be maintained Outcome: Progressing   Problem: Pain Managment: Goal: General experience of comfort will improve and/or be controlled Outcome: Progressing   Problem: Safety: Goal: Ability to remain free from injury will improve Outcome: Progressing

## 2024-03-24 DIAGNOSIS — Z8673 Personal history of transient ischemic attack (TIA), and cerebral infarction without residual deficits: Secondary | ICD-10-CM | POA: Diagnosis not present

## 2024-03-24 DIAGNOSIS — N183 Chronic kidney disease, stage 3 unspecified: Secondary | ICD-10-CM | POA: Diagnosis not present

## 2024-03-24 DIAGNOSIS — I480 Paroxysmal atrial fibrillation: Secondary | ICD-10-CM | POA: Diagnosis not present

## 2024-03-24 DIAGNOSIS — G9340 Encephalopathy, unspecified: Secondary | ICD-10-CM | POA: Diagnosis not present

## 2024-03-24 NOTE — Progress Notes (Signed)
 Mobility Specialist: Progress Note   03/24/24 1500  Mobility  Activity Ambulated with assistance  Level of Assistance Contact guard assist, steadying assist  Assistive Device Front wheel walker  Distance Ambulated (ft) 20 ft  Activity Response Tolerated well  Mobility Referral Yes  Mobility visit 1 Mobility  Mobility Specialist Start Time (ACUTE ONLY) 1530  Mobility Specialist Stop Time (ACUTE ONLY) 1541  Mobility Specialist Time Calculation (min) (ACUTE ONLY) 11 min    Pt received in chair. MinA for STS, CGA for ambulation to BR. Void successful, pericare done in standing. MinA for STS from elevated commode seat. Returned to EOB. MinA for sit>supine to assist LE. Left in bed with posey belt on, all needs met, call bell in reach. Bed alarm on.   Ileana Lute Mobility Specialist Please contact via SecureChat or Rehab office at [phone]

## 2024-03-24 NOTE — TOC Initial Note (Addendum)
 Transition of Care [facility]) - Initial/Assessment Note    Patient Details  Name: Theresa [name] MRN: [id number] Date of Birth: [birth year]  Transition of Care [facility]) CM/SW Contact:    Bridget Cordella Simmonds, LCSW Phone Number: 03/24/2024, 3:00 PM  Clinical Narrative:     CSW met with pt POA Rosaline Sharps in pt room.  Pt oriented x1, did not participate, all info from [facility].  Pt lives with her husband in assisted living at [facility].  Husband currently in the nursing unit there.  Michelle aware of STR recommendation and is also aware that STR is at [facility] but prefers to pursue return to the nursing unit at [facility] instead.  She has spoken with Bobetta.  CSW left message with Bobetta/[facility].   1345: TC Bobetta: she confirmed all of the above info, they can receive pt back at [facility] nursing unit.  Pt does need to be restraint free x24 hours.  Team updated, RN to confirm pt restraint free.               Expected Discharge Plan: Skilled Nursing Facility Barriers to Discharge: Other (must enter comment) (pending [facility] acceptance)   Patient Goals and CMS Choice     Choice offered to / list presented to : [facility] POA / Guardian Everlina Sharps, [place])      Expected Discharge Plan and Services In-house Referral: Clinical Social Work   Post Acute Care Choice: Skilled Nursing Facility Living arrangements for the past 2 months: Assisted Living Facility ([facility])                                      Prior Living Arrangements/Services Living arrangements for the past 2 months: Assisted Living Facility ([facility]) Lives with:: Spouse, Facility Resident Patient language and need for interpreter reviewed:: Yes        Need for Family Participation in Patient Care: Yes (Comment)   Current home services: Other (comment) (na) Criminal Activity/Legal Involvement Pertinent to Current Situation/Hospitalization: No -  Comment as needed  Activities of Daily Living      Permission Sought/Granted                  Emotional Assessment Appearance:: Appears stated age Attitude/Demeanor/Rapport: Unable to Assess Affect (typically observed): Unable to Assess Orientation: : Oriented to Self      Admission diagnosis:  Acute encephalopathy [G93.40] Patient Active Problem List   Diagnosis Date Noted   Acute encephalopathy 03/20/2024   Falls frequently 02/16/2024   Seizure disorder (HCC) 02/16/2024   Slow transit constipation 02/16/2024   MCI (mild cognitive impairment) 05/19/2023   Pain and swelling of right lower leg 04/06/2023   Fracture of femoral neck, right, closed (HCC) 03/03/2023   Depression 03/03/2023   Pain due to onychomycosis of toenails of both feet 12/23/2019   Balance disorder 05/03/2018   Macular degeneration 03/31/2017   Difficulty swallowing pills 11/26/2016   Hyperlipidemia 09/01/2014   CKD (chronic kidney disease), stage III (HCC) 03/02/2014   Rosacea 11/23/2013   Major depression in full remission (HCC) 11/23/2013   History of TIA (transient ischemic attack) and stroke 03/21/2013   Hypertension 03/04/2012   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 05/30/2011   History of colonic polyps 10/22/2009   CHANGE IN BOWELS 09/20/2009   GASTROESOPHAGEAL REFLUX DISEASE, MILD 06/04/2009   Raynaud's  syndrome 03/06/2009   Idiopathic scoliosis and kyphoscoliosis 01/03/2009   Paroxysmal atrial fibrillation (HCC) 07/07/2007   Hypothyroidism 11/23/2006   Osteopenia 11/23/2006   History of mitral valve repair 09/11/2006   Osteoarthritis 09/11/2006   PCP:  Charlanne Fredia CROME, MD Pharmacy:   [facility] - [place], [place] - [phone] [address] [place] [address] Phone: [phone] Fax: [phone]     Social Drivers of Health (SDOH) Social History: SDOH Screenings   Food Insecurity: No Food Insecurity (03/21/2024)   Housing: Low Risk (03/21/2024)  Transportation Needs: No Transportation Needs (03/21/2024)  Utilities: Not At Risk (03/21/2024)  Depression (PHQ2-9): Low Risk (11/19/2023)  Financial Resource Strain: Low Risk (03/02/2023)  Physical Activity: Insufficiently Active (03/02/2023)  Social Connections: Moderately Isolated (03/21/2024)  Stress: No Stress Concern Present (03/02/2023)  Tobacco Use: Medium Risk (02/16/2024)  Health Literacy: Adequate Health Literacy (03/02/2023)   SDOH Interventions:     Readmission Risk Interventions     No data to display

## 2024-03-24 NOTE — Progress Notes (Signed)
 Pt oriented x1.  Reportedly has no family and professional POA: Astronomer.  From [facility], unclear which facility. Message left with Amber at [facility]. Message left with Rosaline Sharps. Cathlyn Ferry, MSW, LCSW 1/8/20262:04 PM

## 2024-03-24 NOTE — Progress Notes (Addendum)
 The patient got out of bed and sat at the edge of the bed despite being on restraints.She had no clothes on,upon this RNs assessment the patient had developed some redness on her belly.

## 2024-03-24 NOTE — Progress Notes (Signed)
 "        [facility]                                                                               Theresa [name], is a 89 y.o. female, DOB - 04-06-1931, FMW:[id number] Admit date - 03/20/2024    Outpatient Primary MD for the patient is Charlanne Fredia CROME, MD  LOS - 0  days    Brief summary    89 y.o. female with medical history significant of hypertension, hyperlipidemia, TIA, CVA, hypothyroidism, atrial fibrillation, GERD, CKD 3, depression, seizure disorder, mild cognitive impairment presenting after a fall and altered mental status at SNF.  Reportedly she has been having increasing confusion in the past 24 hours.  Patient has had increased falls for the past month.  Lab workup included CMP with BUN 28, creatinine 1.11, glucose 125, AST 65, ALT 46, alk phos 147.  CBC with leukocytosis of 12.7.  Respiratory panel for flu COVID RSV negative.  Urinalysis with protein, leukocytes, no bacteria.  Ammonia negative. Chest x-ray, pelvis x-ray, CT head and CT cervical spine negative for acute abnormalities  MRI brain is also negative for any acute findings. Patient admitted for further management    Assessment & Plan    Assessment and Plan:   Acute metabolic encephalopathy in the setting of TIA and seizure disorder No focal deficits noted on exam Patient has baseline cognitive impairment, but she is more alert today and oriented to place and person. She is still a little impulsive when getting out of bed or chair.  On admission patient underwent CT head, CT spine and MRI brain which were all negative for acute abnormality EEG is negative for new epileptiform activities. No source of infection at this time on chest x-ray or urinalysis TSH, vitamin B12 and ammonia within normal limits Suspect a component of dehydration and worsening of her cognitive impairment Therapy evaluations ordered and recommending SNF.     Essential hypertension Blood pressure parameters are  optimal    Hypothyroidism TSH within normal limits, continue Synthroid   Stage IIIa CKD Creatinine at baseline and stable    Atrial fibrillation Rate controlled on metoprolol , on Eliquis  for anticoagulation. In view of her recurrent falls and cognitive impairment recommend holding/ stopping Eliquis  after discussing with her cardiologist outpatient.    Depression Continue with Lexapro     Hyperlipidemia Continue with statin   Leukocytosis Resolved   Waiting for her to be off restraints so she can be discharged to SNF int he next 24 hours.   Estimated body mass index is 20.41 kg/m as calculated from the following:   Height as of this encounter: 5' 1 (1.549 m).   Weight as of this encounter: 49 kg.  Code Status: DNR limited.  DVT Prophylaxis:  apixaban  (ELIQUIS ) tablet 2.5 mg Start: 03/20/24 2200 apixaban  (ELIQUIS ) tablet 2.5 mg   Level of Care: Level of care: Med-Surg Family Communication: none at bedside. Unable to reach family/ tried both phone numbers on facesheet.   Disposition Plan:     Remains inpatient appropriate:  SNF  Procedures:  None.   Consultants:   None.  Antimicrobials:   Anti-infectives (From admission, onward)  None        Medications  Scheduled Meds:  apixaban   2.5 mg Oral BID   lacosamide   100 mg Oral BID   levothyroxine   75 mcg Oral Q0600   metoprolol  tartrate  25 mg Oral BID   rosuvastatin   10 mg Oral Daily   sodium chloride  flush  3 mL Intravenous Q12H   Continuous Infusions: PRN Meds:.acetaminophen  **OR** acetaminophen , polyethylene glycol    Subjective:   Theresa [name] was seen and examined today.  More alert and comfortable.   Objective:   Vitals:   03/23/24 1313 03/23/24 1935 03/24/24 0731 03/24/24 1607  BP: (!) 159/94 (!) 148/104 (!) 147/117 129/89  Pulse: 100 (!) 106 74 84  Resp: 18 18 17 16   Temp: 98 F (36.7 C) 99.1 F (37.3 C) 97.7 F (36.5 C) 97.8 F (36.6 C)  TempSrc:  Oral    SpO2: 91% 94%  92% 97%  Weight:      Height:        Intake/Output Summary (Last 24 hours) at 03/24/2024 1752 Last data filed at 03/24/2024 1639 Gross per 24 hour  Intake 480 ml  Output --  Net 480 ml   Filed Weights   03/21/24 1500  Weight: 49 kg     Exam General exam: Appears calm and comfortable  Respiratory system: Clear to auscultation. Respiratory effort normal. Cardiovascular system: S1 & S2 heard, RRR. No JVD, Gastrointestinal system: Abdomen is nondistended, soft and nontender.  Central nervous system: Alert and oriented. To person and place.  Extremities: Symmetric 5 x 5 power. Skin: No rashes,  Psychiatry: pleasantly confused.     Data Reviewed:  I have personally reviewed following labs and imaging studies   CBC Lab Results  Component Value Date   WBC 10.2 03/21/2024   RBC 3.83 (L) 03/21/2024   HGB 12.3 03/21/2024   HCT 37.2 03/21/2024   MCV 97.1 03/21/2024   MCH 32.1 03/21/2024   PLT 180 03/21/2024   MCHC 33.1 03/21/2024   RDW 13.3 03/21/2024   LYMPHSABS 2.0 02/15/2024   MONOABS 0.4 02/15/2024   EOSABS 0.1 02/15/2024   BASOSABS 0.1 02/15/2024     Last metabolic panel Lab Results  Component Value Date   NA 136 03/21/2024   K 4.4 03/21/2024   CL 102 03/21/2024   CO2 23 03/21/2024   BUN 27 (H) 03/21/2024   CREATININE 1.02 (H) 03/21/2024   GLUCOSE 111 (H) 03/21/2024   GFRNONAA 52 (L) 03/21/2024   GFRAA 76 (L) 03/21/2013   CALCIUM  9.2 03/21/2024   PHOS 3.1 03/08/2023   PROT 6.2 (L) 03/21/2024   ALBUMIN 4.0 03/21/2024   BILITOT 0.6 03/21/2024   ALKPHOS 120 03/21/2024   AST 42 (H) 03/21/2024   ALT 37 03/21/2024   ANIONGAP 11 03/21/2024    CBG (last 3)  No results for input(s): GLUCAP in the last 72 hours.    Coagulation Profile: No results for input(s): INR, PROTIME in the last 168 hours.   Radiology Studies: No results found.     Elgie Butter M.D. [facility] 03/24/2024, 5:52 PM  Available via Epic secure chat 7am-7pm After 7  pm, please refer to night coverage provider listed on amion.    "

## 2024-03-24 NOTE — Evaluation (Signed)
 Physical Therapy Evaluation Patient Details Name: Theresa [name] MRN: [id number] DOB: [birth year] Today's Date: 03/24/2024  History of Present Illness  Pt is a 89 y/o F presenting from SNF after fall with altered mental status. PMH includes hypertension, hyperlipidemia, TIA, CVA, hypothyroidism, atrial fibrillation, GERD, CKD 3, depression, seizure disorder, mild cognitive impairment  Clinical Impression  Received pt supine in bed with NT checking vitals. Pt agreeable to PT evaluation and pleasantly disoriented throughout session but able to recall location and that she lives at [facility] and used rollator with mobility. Removed waist restraint and pt performed bed mobility with supervision and all transfers with RW and min A (initial posterior bias). Pt ambulated in/out of bathroom with RW and CGA/min A. Pt able to void and performed hygiene management in standing without assist. Breakfast tray arrived and set pt up to eat and washed face. Pt left sitting in recliner eating breakfast, needs within reach, and seatbelt alarm on. RN and NT aware of pt's current status. Pt appears near baseline but would benefit from continued therapy <3 hours/day to address current strength, endurance, and balance deficits. Acute PT to cont to follow.       If plan is discharge home, recommend the following: A little help with walking and/or transfers;A little help with bathing/dressing/bathroom;Assistance with cooking/housework;Direct supervision/assist for medications management;Direct supervision/assist for financial management;Assist for transportation;Help with stairs or ramp for entrance;Supervision due to cognitive status   Can travel by private vehicle   Yes    Equipment Recommendations Rolling walker (2 wheels)  Recommendations for Other Services       Functional Status Assessment Patient has had a recent decline in their functional status and demonstrates the ability to make significant  improvements in function in a reasonable and predictable amount of time.     Precautions / Restrictions Precautions Precautions: Fall Recall of Precautions/Restrictions: Impaired Restrictions Weight Bearing Restrictions Per Provider Order: No      Mobility  Bed Mobility Overal bed mobility: Needs Assistance Bed Mobility: Rolling, Supine to Sit Rolling: Supervision   Supine to sit: Supervision     General bed mobility comments: from flat bed using bedrails Patient Response: Cooperative  Transfers Overall transfer level: Needs assistance Equipment used: Rolling walker (2 wheels) Transfers: Sit to/from Stand, Bed to chair/wheelchair/BSC Sit to Stand: Min assist           General transfer comment: min A to stand with inital posterior bias    Ambulation/Gait Ambulation/Gait assistance: Contact guard assist, Min assist Gait Distance (Feet): 15 Feet Assistive device: Rolling walker (2 wheels) Gait Pattern/deviations: Decreased step length - right, Decreased step length - left, Decreased stride length, Trunk flexed, Narrow base of support Gait velocity: decreased Gait velocity interpretation: <1.31 ft/sec, indicative of household Public Librarian Bed Tilt Bed Patient Response: Cooperative  Modified Rankin (Stroke Patients Only)       Balance Overall balance assessment: Needs assistance Sitting-balance support: Feet supported, Bilateral upper extremity supported Sitting balance-Leahy Scale: Fair Sitting balance - Comments: able to maintain static sitting balance with close supervision Postural control: Posterior lean Standing balance support: Bilateral upper extremity supported, Reliant on assistive device for balance (RW) Standing balance-Leahy Scale: Poor Standing balance comment: required CGA/min A for dynamic standing balance  Pertinent Vitals/Pain Pain  Assessment Pain Assessment: Faces Faces Pain Scale: No hurt    Home Living Family/patient expects to be discharged to:: Skilled nursing facility                   Additional Comments: [facility] SNF    Prior Function Prior Level of Function : Needs assist             Mobility Comments: reports using rollator at SNF and states she was independent prior to fall       Extremity/Trunk Assessment   Upper Extremity Assessment Upper Extremity Assessment: Defer to OT evaluation    Lower Extremity Assessment Lower Extremity Assessment: Generalized weakness    Cervical / Trunk Assessment Cervical / Trunk Assessment: Kyphotic  Communication   Communication Communication: No apparent difficulties    Cognition Arousal: Alert Behavior During Therapy: WFL for tasks assessed/performed   PT - Cognitive impairments: History of cognitive impairments, No family/caregiver present to determine baseline, Orientation, Awareness, Memory, Sequencing, Problem solving, Safety/Judgement   Orientation impairments: Person, Place                   PT - Cognition Comments: pleasantly disoriented but able to recall location and where she was living prior to admission Following commands: Intact       Cueing Cueing Techniques: Verbal cues, Tactile cues     General Comments      Exercises     Assessment/Plan    PT Assessment Patient needs continued PT services  PT Problem List Decreased strength;Decreased activity tolerance;Decreased balance;Decreased mobility;Decreased coordination;Decreased safety awareness;Decreased cognition;Decreased skin integrity       PT Treatment Interventions DME instruction;Gait training;Functional mobility training;Therapeutic activities;Therapeutic exercise;Balance training;Stair training;Neuromuscular re-education;Patient/family education;Wheelchair mobility training    PT Goals (Current goals can be found in the Care Plan section)  Acute  Rehab PT Goals Patient Stated Goal: did not state PT Goal Formulation: With patient Time For Goal Achievement: 04/07/24 Potential to Achieve Goals: Good    Frequency Min 2X/week     Co-evaluation               AM-PAC PT 6 Clicks Mobility  Outcome Measure Help needed turning from your back to your side while in a flat bed without using bedrails?: A Little Help needed moving from lying on your back to sitting on the side of a flat bed without using bedrails?: A Little Help needed moving to and from a bed to a chair (including a wheelchair)?: A Little Help needed standing up from a chair using your arms (e.g., wheelchair or bedside chair)?: A Little Help needed to walk in hospital room?: A Little Help needed climbing 3-5 steps with a railing? : A Lot 6 Click Score: 17    End of Session Equipment Utilized During Treatment: Gait belt Activity Tolerance: Patient tolerated treatment well Patient left: in chair;with call bell/phone within reach;with chair alarm set Nurse Communication: Mobility status (RN and NT notified of pt's current status. Seatbelt alarm applied) PT Visit Diagnosis: Unsteadiness on feet (R26.81);Repeated falls (R29.6);Muscle weakness (generalized) (M62.81);History of falling (Z91.81);Dizziness and giddiness (R42)    Time: 9271-9248 PT Time Calculation (min) (ACUTE ONLY): 23 min   Charges:   PT Evaluation $PT Eval Low Complexity: 1 Low PT Treatments $Therapeutic Activity: 8-22 mins PT General Charges $$ ACUTE PT VISIT: 1 Visit         Therisa Stains PT, DPT Therisa HERO Zaunegger 03/24/2024, 8:01 AM

## 2024-03-24 NOTE — NC FL2 (Addendum)
 " [place]  MEDICAID FL2 LEVEL OF CARE FORM     IDENTIFICATION  Patient Name: Theresa [name] Birthdate: 07-Jul-1931 Sex: female Admission Date (Current Location): 03/20/2024  [facility] and [place] Number:  Producer, Television/film/video and Address:  [facility], [address], [place], [place] [address]      Provider Number: [id number]  Attending Physician Name and Address:  Cherlyn Labella, MD  Relative Name and Phone Number:       Current Level of Care: Hospital Recommended Level of Care: Skilled Nursing Facility Prior Approval Number:    Date Approved/Denied:   PASRR Number: [id number]  Discharge Plan: SNF    Current Diagnoses: Patient Active Problem List   Diagnosis Date Noted   Acute encephalopathy 03/20/2024   Falls frequently 02/16/2024   Seizure disorder (HCC) 02/16/2024   Slow transit constipation 02/16/2024   MCI (mild cognitive impairment) 05/19/2023   Pain and swelling of right lower leg 04/06/2023   Fracture of femoral neck, right, closed (HCC) 03/03/2023   Depression 03/03/2023   Pain due to onychomycosis of toenails of both feet 12/23/2019   Balance disorder 05/03/2018   Macular degeneration 03/31/2017   Difficulty swallowing pills 11/26/2016   Hyperlipidemia 09/01/2014   CKD (chronic kidney disease), stage III (HCC) 03/02/2014   Rosacea 11/23/2013   Major depression in full remission (HCC) 11/23/2013   History of TIA (transient ischemic attack) and stroke 03/21/2013   Hypertension 03/04/2012   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 05/30/2011   History of colonic polyps 10/22/2009   CHANGE IN BOWELS 09/20/2009   GASTROESOPHAGEAL REFLUX DISEASE, MILD 06/04/2009   Raynaud's syndrome 03/06/2009   Idiopathic scoliosis and kyphoscoliosis 01/03/2009   Paroxysmal atrial fibrillation (HCC) 07/07/2007   Hypothyroidism 11/23/2006   Osteopenia 11/23/2006   History of mitral valve repair 09/11/2006   Osteoarthritis 09/11/2006     Orientation RESPIRATION BLADDER Height & Weight     Self  Normal Continent Weight: 108 lb 0.4 oz (49 kg) Height:  5' 1 (154.9 cm)  BEHAVIORAL SYMPTOMS/MOOD NEUROLOGICAL BOWEL NUTRITION STATUS    Convulsions/Seizures Continent Diet (see discharge summary)  AMBULATORY STATUS COMMUNICATION OF NEEDS Skin   Limited Assist Verbally Other (Comment) (ecchymosis, redness)                       Personal Care Assistance Level of Assistance  Bathing, Feeding, Dressing Bathing Assistance: Limited assistance Feeding assistance: Limited assistance Dressing Assistance: Limited assistance     Functional Limitations Info             SPECIAL CARE FACTORS FREQUENCY  PT (By licensed PT), OT (By licensed OT)     PT evaluate and treat. OT evaluate and treat.             Contractures Contractures Info: Not present    Additional Factors Info  Code Status, Allergies Code Status Info: DNR Allergies Info: Tape, Vioxx (Rofecoxib)           Current Medications (03/24/2024):  This is the current hospital active medication list Current Facility-Administered Medications  Medication Dose Route Frequency Provider Last Rate Last Admin   acetaminophen  (TYLENOL ) tablet 650 mg  650 mg Oral Q6H PRN Seena Marsa NOVAK, MD   650 mg at 03/22/24 2016   Or   acetaminophen  (TYLENOL ) suppository 650 mg  650 mg Rectal Q6H PRN Seena Marsa NOVAK, MD       apixaban  (ELIQUIS ) tablet 2.5 mg  2.5 mg  Oral BID Melvin, Alexander B, MD   2.5 mg at 03/24/24 1021   lacosamide  (VIMPAT ) tablet 100 mg  100 mg Oral BID Sigdel, Santosh, MD   100 mg at 03/24/24 1021   levothyroxine  (SYNTHROID ) tablet 75 mcg  75 mcg Oral Q0600 Melvin, Alexander B, MD   75 mcg at 03/24/24 9386   metoprolol  tartrate (LOPRESSOR ) tablet 25 mg  25 mg Oral BID Leotis Bogus, MD   25 mg at 03/24/24 1021   polyethylene glycol (MIRALAX  / GLYCOLAX ) packet 17 g  17 g Oral Daily PRN Melvin, Alexander B, MD       rosuvastatin  (CRESTOR )  tablet 10 mg  10 mg Oral Daily Melvin, Alexander B, MD   10 mg at 03/24/24 1021   sodium chloride  flush (NS) 0.9 % injection 3 mL  3 mL Intravenous Q12H Seena Marsa NOVAK, MD   3 mL at 03/24/24 1022     Discharge Medications: Please see discharge summary for a list of discharge medications.  Relevant Imaging Results:  Relevant Lab Results:   Additional Information SS# [id number]  Bridget Cordella Simmonds, LCSW     "

## 2024-03-24 NOTE — Plan of Care (Signed)

## 2024-03-25 ENCOUNTER — Ambulatory Visit: Admitting: Neurology

## 2024-03-25 DIAGNOSIS — G9340 Encephalopathy, unspecified: Secondary | ICD-10-CM | POA: Diagnosis not present

## 2024-03-25 DIAGNOSIS — E039 Hypothyroidism, unspecified: Secondary | ICD-10-CM | POA: Diagnosis not present

## 2024-03-25 DIAGNOSIS — I10 Essential (primary) hypertension: Secondary | ICD-10-CM | POA: Diagnosis not present

## 2024-03-25 NOTE — TOC Progression Note (Addendum)
 Transition of Care [facility]) - Progression Note    Patient Details  Name: Theresa [name] MRN: [id number] Date of Birth: June 05, 1931  [facility] CM/SW Contact  Bridget Cordella Simmonds, LCSW Phone Number: 03/25/2024, 12:21 PM  Clinical Narrative:   CSW spoke with Bobetta/[facility]: discussed pt has been out of restraints since 1630 yesterday.  They can receive pt today after 1630.  MD informed.    1500: CSW spoke with POA Rosaline regarding transportation. Per PT, does not require ambulance transport.  Michelle arranged wheelchair transport through [employer], [phone], with plan to arrive at 530pm.   Expected Discharge Plan: Skilled Nursing Facility Barriers to Discharge: Other (must enter comment) (pending [facility] acceptance)               Expected Discharge Plan and Services In-house Referral: Clinical Social Work   Post Acute Care Choice: Skilled Nursing Facility Living arrangements for the past 2 months: Assisted Living Facility ([facility])                                       Social Drivers of Health (SDOH) Interventions SDOH Screenings   Food Insecurity: No Food Insecurity (03/21/2024)  Housing: Low Risk (03/21/2024)  Transportation Needs: No Transportation Needs (03/21/2024)  Utilities: Not At Risk (03/21/2024)  Depression (PHQ2-9): Low Risk (11/19/2023)  Financial Resource Strain: Low Risk (03/02/2023)  Physical Activity: Insufficiently Active (03/02/2023)  Social Connections: Moderately Isolated (03/21/2024)  Stress: No Stress Concern Present (03/02/2023)  Tobacco Use: Medium Risk (02/16/2024)  Health Literacy: Adequate Health Literacy (03/02/2023)    Readmission Risk Interventions     No data to display

## 2024-03-25 NOTE — Discharge Summary (Signed)
 " Physician Discharge Summary   Patient: Theresa [name] MRN: [id number] DOB: [birth year]  Admit date:     03/20/2024  Discharge date: 03/25/2024  Discharge Physician: Elgie Butter   PCP: Charlanne Fredia CROME, MD   Recommendations at discharge:  Defer to PCP and cardiology for stopping the eliquis .   Discharge Diagnoses: Principal Problem:   Acute encephalopathy Active Problems:   Paroxysmal atrial fibrillation (HCC)   Hypothyroidism   GASTROESOPHAGEAL REFLUX DISEASE, MILD   Hypertension   History of TIA (transient ischemic attack) and stroke   CKD (chronic kidney disease), stage III (HCC)   Hyperlipidemia   Depression   MCI (mild cognitive impairment)   Seizure disorder (HCC)  Resolved Problems:   * No resolved hospital problems. *  Hospital Course: 89 y.o. female with medical history significant of hypertension, hyperlipidemia, TIA, CVA, hypothyroidism, atrial fibrillation, GERD, CKD 3, depression, seizure disorder, mild cognitive impairment presenting after a fall and altered mental status at SNF.  Reportedly she has been having increasing confusion in the past 24 hours.  Patient has had increased falls for the past month.  Lab workup included CMP with BUN 28, creatinine 1.11, glucose 125, AST 65, ALT 46, alk phos 147.  CBC with leukocytosis of 12.7.  Respiratory panel for flu COVID RSV negative.  Urinalysis with protein, leukocytes, no bacteria.  Ammonia negative. Chest x-ray, pelvis x-ray, CT head and CT cervical spine negative for acute abnormalities  MRI brain is also negative for any acute findings. Patient admitted for further management     Assessment and Plan:    Acute metabolic encephalopathy in the setting of TIA and seizure disorder No focal deficits noted on exam Patient has baseline cognitive impairment, but she is more alert today and oriented to place and person. She is still a little impulsive when getting out of bed or chair.  On admission patient underwent CT  head, CT spine and MRI brain which were all negative for acute abnormality EEG is negative for new epileptiform activities. No source of infection at this time on chest x-ray or urinalysis TSH, vitamin B12 and ammonia within normal limits Suspect a component of dehydration and worsening of her cognitive impairment Therapy evaluations ordered and recommending SNF.        Essential hypertension Blood pressure parameters are optimal       Hypothyroidism TSH within normal limits, continue Synthroid    Stage IIIa CKD Creatinine at baseline and stable       Atrial fibrillation Rate controlled on metoprolol , on Eliquis  for anticoagulation. In view of her recurrent falls and cognitive impairment recommend holding/ stopping Eliquis  after discussing with her cardiologist outpatient.       Depression Continue with Lexapro        Hyperlipidemia Continue with statin     Leukocytosis Resolved       Consultants: none.  Procedures performed: MRI brain.  Disposition: Skilled nursing facility Diet recommendation:  Regular diet DISCHARGE MEDICATION: Allergies as of 03/25/2024       Reactions   Tape Other (See Comments)   Skin tears and bruises easily.   Vioxx [rofecoxib] Other (See Comments)   Elevated LFT's        Medication List     TAKE these medications    amoxicillin  500 MG capsule Commonly known as: AMOXIL  Take 2,000 mg by mouth as needed (dental Appointment). Give 2000 mg by mouth as needed for dental appointments AM of dental appointments   Citracal Petites/Vitamin D  200-6.25 MG-MCG Tabs Generic  drug: Calcium  Citrate-Vitamin D  Take 1 tablet by mouth 2 (two) times daily.   docusate sodium  100 MG capsule Commonly known as: COLACE Take 1 capsule (100 mg total) by mouth 2 (two) times daily.   Eliquis  2.5 MG Tabs tablet Generic drug: apixaban  Take 1 tablet (2.5 mg total) by mouth 2 (two) times daily.   escitalopram  10 MG tablet Commonly known as:  LEXAPRO  Take 15 mg by mouth daily.   fluticasone  50 MCG/ACT nasal spray Commonly known as: FLONASE  Place 2 sprays into both nostrils daily.   glucosamine-chondroitin 500-400 MG tablet Take 1 tablet by mouth 2 (two) times daily.   Lacosamide  100 MG Tabs Take 1 tablet (100 mg total) by mouth in the morning and at bedtime.   levothyroxine  75 MCG tablet Commonly known as: SYNTHROID  TAKE 1 TABLET BY MOUTH EVERY DAY BEFORE BREAKFAST   loratadine  10 MG tablet Commonly known as: CLARITIN  Take 1 tablet (10 mg total) by mouth daily.   metoprolol  tartrate 25 MG tablet Commonly known as: LOPRESSOR  Take 1 tablet (25 mg total) by mouth 2 (two) times daily.   polyethylene glycol 17 g packet Commonly known as: MIRALAX  / GLYCOLAX  Take 17 g by mouth daily as needed for mild constipation.   PRESERVISION AREDS 2 PO Take 1 capsule by mouth 2 (two) times daily. Reported on 09/04/2015   rosuvastatin  10 MG tablet Commonly known as: CRESTOR  Take 10 mg by mouth once. 10 mg by mouth in the evening for HLD   Simply Saline 0.9 % Aers Generic drug: Saline Place 2 each into the nose daily as needed.   Tylenol  8 Hour Arthritis Pain 650 MG CR tablet Generic drug: acetaminophen  Take 650 mg by mouth daily.   vitamin A 10000 UNIT capsule Take 1 capsule by mouth daily.   zinc  oxide 20 % ointment Apply 1 Application topically See admin instructions. Apply topically to peri/buttocks as needed after each incontinent episode for skin protection.        Contact information for after-discharge care     Destination     [facility] .   Service: Skilled Nursing Contact information: [phone] [place]  [address] [phone]                    Discharge Exam: Theresa [name]   03/21/24 1500  Weight: 49 kg   General exam: Appears calm and comfortable  Respiratory system: Clear to auscultation. Respiratory effort normal. Cardiovascular system: S1 & S2  heard, RRR Gastrointestinal system: Abdomen is nondistended, soft and nontender.  Central nervous system: Alert and oriented to person and place.  Extremities: Symmetric 5 x 5 power. Skin: No rashes,  Psychiatry: Mood & affect appropriate.    Condition at discharge: fair  The results of significant diagnostics from this hospitalization (including imaging, microbiology, ancillary and laboratory) are listed below for reference.   Imaging Studies: EEG adult Result Date: 03/21/2024 Shelton Arlin KIDD, MD     03/21/2024  9:33 AM Patient Name: Theresa [name] MRN: [id number] Epilepsy Attending: Arlin KIDD Shelton Referring Physician/Provider: Seena Marsa NOVAK, MD Date: 03/21/2024 Duration: 22.27 mins Patient history: 89yo F with decline in mental status over the last 24 hours with new word finding difficulty and disorientation. EEG to evaluate for seizure Level of alertness: Awake, drowsy AEDs during EEG study: None Technical aspects: This EEG study was done with scalp electrodes positioned according to the 10-20 International system of electrode placement. Electrical activity was reviewed with band  pass filter of 1-70Hz , sensitivity of 7 uV/mm, display speed of 101mm/sec with a 60Hz  notched filter applied as appropriate. EEG data were recorded continuously and digitally stored.  Video monitoring was available and reviewed as appropriate. Description: The posterior dominant rhythm consists of 8 Hz activity of moderate voltage (25-35 uV) seen predominantly in posterior head regions, symmetric and reactive to eye opening and eye closing. Drowsiness was characterized by attenuation of the posterior background rhythm. EEG showed intermittent generalized 3-6hz  theta-delta slowing admixed with 15 to 18 Hz beta activity distributed symmetrically and diffusely. Hyperventilation and photic stimulation were not performed.   ABNORMALITY - Intermittent slow, generalized  IMPRESSION: This study is suggestive of mild diffuse  encephalopathy. No seizures or epileptiform discharges were seen throughout the recording.  Arlin MALVA Krebs    MR BRAIN WO CONTRAST Result Date: 03/21/2024 EXAM: MRI BRAIN WITHOUT CONTRAST 03/21/2024 03:35:28 AM TECHNIQUE: Multiplanar multisequence MRI of the head/brain was performed without the administration of intravenous contrast. COMPARISON: CT of the head dated 03/20/2024 and MRI of the head dated 05/14/2023. CLINICAL HISTORY: Neuro deficit, acute, stroke suspected. FINDINGS: BRAIN AND VENTRICLES: There is no restricted diffusion to indicate acute or recent infarction. No intracranial hemorrhage. No mass. No midline shift. No hydrocephalus. The sella is unremarkable. Normal flow voids. There are focal encephalomalacia changes within the left frontal operculum. There are gliotic changes within the left frontal and parietal lobes. Chronic lacunar infarcts are again noted within the basal ganglia bilaterally, the left thalamus, and bilateral cerebellar hemispheres. There is moderate periventricular and deep cerebral white matter disease. ORBITS: The patient is status post bilateral lens replacement. SINUSES AND MASTOIDS: No acute abnormality. BONES AND SOFT TISSUES: Normal marrow signal. No acute soft tissue abnormality. IMPRESSION: 1. No evidence of acute or recent infarction. 2. Focal encephalomalacia within the left frontal lobe with gliotic changes in the frontal and parietal lobes. 3. Chronic lacunar infarcts within the basal ganglia bilaterally, the left thalamus, and bilateral cerebellar hemispheres. 4. Moderate periventricular and deep cerebral white matter disease. Electronically signed by: Evalene Coho MD 03/21/2024 04:05 AM EST RP Workstation: [id number]   DG Abd 1 View Result Date: 03/21/2024 EXAM: 1 VIEW XRAY OF THE ABDOMEN 03/21/2024 01:39:00 AM COMPARISON: None available. CLINICAL HISTORY: Encounter for imaging to screen for metal prior to MRI. FINDINGS: BOWEL: Nonobstructive bowel gas  pattern. SOFT TISSUES: Residual contrast material in the renal collecting system and bladder. Surgical clips in the right upper quadrant. Cardiac valve prosthesis. No radiopaque foreign bodies are demonstrated within the field of view, although residual contrast material could obscure foreign bodies in those areas. BONES: Postoperative changes with rod and screw fixation of the lumbar spine. Right hip hemiarthroplasty. Degenerative changes in the lumbar spine. Scoliosis convex towards the right. IMPRESSION: 1. No radiopaque foreign bodies identified within the field of view, although residual contrast material could obscure foreign bodies. 2. Postsurgical metallic hardware including lumbar rod and screw fixation, right hip hemiarthroplasty, cardiac valve prosthesis, and right upper quadrant surgical clips. Electronically signed by: Elsie Gravely MD 03/21/2024 01:46 AM EST RP Workstation: [id number]   CT Angio Chest PE W and/or Wo Contrast Result Date: 03/20/2024 EXAM: CTA CHEST 03/20/2024 11:23:05 PM TECHNIQUE: CTA of the chest was performed after the administration of intravenous contrast. Multiplanar reformatted images are provided for review. MIP images are provided for review. Automated exposure control, iterative reconstruction, and/or weight based adjustment of the mA/kV was utilized to reduce the radiation dose to as low as reasonably achievable. COMPARISON: None available.  CLINICAL HISTORY: Pulmonary embolism (PE) suspected, high prob. FINDINGS: PULMONARY ARTERIES: Pulmonary arteries are adequately opacified for evaluation. No acute pulmonary embolus. Main pulmonary artery is normal in caliber. MEDIASTINUM: Cardiomegaly. Aortic atherosclerosis. The pericardium demonstrates no acute abnormality. There is no acute abnormality of the thoracic aorta. LYMPH NODES: No mediastinal, hilar or axillary lymphadenopathy. LUNGS AND PLEURA: Mosaic attenuation throughout the lungs compatible with air trapping.  Peripheral ground glass opacities in the lower lobes, likely fibrosis. No acute confluent airspace opacities. No effusions. No pneumothorax. UPPER ABDOMEN: Limited images of the upper abdomen are unremarkable. SOFT TISSUES AND BONES: No acute bone or soft tissue abnormality. IMPRESSION: 1. No pulmonary embolism. 2. Mosaic attenuation throughout the lungs compatible with air trapping. 3. Peripheral ground glass opacities in the lower lobes, likely fibrosis. 4. Cardiomegaly and aortic atherosclerosis. Electronically signed by: Franky Crease MD 03/20/2024 11:28 PM EST RP Workstation: [id number]   CT CERVICAL SPINE WO CONTRAST Result Date: 03/20/2024 CLINICAL DATA:  Trauma EXAM: CT CERVICAL SPINE WITHOUT CONTRAST TECHNIQUE: Multidetector CT imaging of the cervical spine was performed without intravenous contrast. Multiplanar CT image reconstructions were also generated. RADIATION DOSE REDUCTION: This exam was performed according to the departmental dose-optimization program which includes automated exposure control, adjustment of the mA and/or kV according to patient size and/or use of iterative reconstruction technique. COMPARISON:  CT 03/15/2024, 02/15/2024 FINDINGS: Alignment: Reversal of cervical lordosis as before. Trace anterolisthesis C3 on C4 and C7 on T1. Similar fused anterolisthesis C4 on C5. Stable facet alignment with chronic, fused slightly anterior positioning of the right C4 facet. Skull base and vertebrae: No acute fracture is seen. Interbody fusion C4-C5 and C5-C6 with facet ankylosis at C4-C5. Soft tissues and spinal canal: No prevertebral fluid or swelling. No visible canal hematoma. Disc levels: Interbody fusion C4 through C6. Advanced disc space narrowing and degenerative change C3-C4 and C6-C7. At least moderate spinal stenosis at C6-C7. Facet degenerative change at multiple levels with foraminal narrowing. Upper chest: Negative. Other: None IMPRESSION: 1. No acute osseous abnormality. 2. Similar  reversal of cervical lordosis and degenerative changes as above. Suspected at least moderate spinal stenosis at C6-C7 Electronically Signed   By: Luke Bun M.D.   On: 03/20/2024 15:23   CT HEAD WO CONTRAST Result Date: 03/20/2024 CLINICAL DATA:  Head trauma EXAM: CT HEAD WITHOUT CONTRAST TECHNIQUE: Contiguous axial images were obtained from the base of the skull through the vertex without intravenous contrast. RADIATION DOSE REDUCTION: This exam was performed according to the departmental dose-optimization program which includes automated exposure control, adjustment of the mA and/or kV according to patient size and/or use of iterative reconstruction technique. COMPARISON:  CT 03/15/2024, 02/15/2024 FINDINGS: Brain: No acute territorial infarction, hemorrhage or intracranial mass. Chronic left MCA infarct. Atrophy and chronic small vessel ischemic changes of the white matter. Small chronic cerebellar infarcts. Chronic lacunar infarcts in the basal ganglia. Stable ventricle size. Vascular: No hyperdense vessels.  Carotid vascular calcification Skull: No fracture Sinuses/Orbits: No acute finding. Other: Small right frontal scalp hematoma, decreased compared to prior. IMPRESSION: 1. No CT evidence for acute intracranial abnormality. 2. Atrophy and chronic small vessel ischemic changes of the white matter. Chronic left MCA infarct and small chronic cerebellar and basal ganglial infarcts Electronically Signed   By: Luke Bun M.D.   On: 03/20/2024 15:11   DG Pelvis 1-2 Views Result Date: 03/20/2024 CLINICAL DATA:  [id number] Trauma [id number] EXAM: PELVIS - 1-2 VIEW COMPARISON:  March 03, 2023, February 15, 2024 FINDINGS: Osteopenia. No acute fracture  or dislocation. Partial visualization of orthopedic hardware status post posterior fixation of the lower lumbar spine and RIGHT hip arthroplasty. Sacrum is obscured by overlapping bowel contents. IMPRESSION: 1. No acute fracture or dislocation. 2. If there is a  persistent clinical concern for nondisplaced hip or pelvic fracture, recommend dedicated pelvic CT or MRI. Electronically Signed   By: Corean Salter M.D.   On: 03/20/2024 13:55   DG Chest 1 View Result Date: 03/20/2024 CLINICAL DATA:  [id number] Trauma [id number] EXAM: CHEST  1 VIEW COMPARISON:  February 15, 2024, March 03, 2023 FINDINGS: The cardiomediastinal silhouette is unchanged and enlarged in contour.Status post valve repair. Atherosclerotic calcifications of the tortuous aorta. No pleural effusion. No pneumothorax. No acute pleuroparenchymal abnormality. Remote LEFT-sided rib fractures. IMPRESSION: No acute cardiopulmonary abnormality. Electronically Signed   By: Corean Salter M.D.   On: 03/20/2024 13:53   CT Cervical Spine Wo Contrast Result Date: 03/15/2024 EXAM: CT CERVICAL SPINE WITHOUT CONTRAST 03/15/2024 11:16:00 AM TECHNIQUE: CT of the cervical spine was performed without the administration of intravenous contrast. Multiplanar reformatted images are provided for review. Automated exposure control, iterative reconstruction, and/or weight based adjustment of the mA/kV was utilized to reduce the radiation dose to as low as reasonably achievable. COMPARISON: 02/15/2024 CLINICAL HISTORY: Neck trauma (Age >= 65y) FINDINGS: BONES AND ALIGNMENT: Similar alignment of the cervical spine with slight reversal of the cervical lordosis. There is similar degenerative anterolisthesis of C3 on C4, C4 on C5, and C7 on T1. There is additional similar trace degenerative retrolisthesis of C5 on C6. There is ankylosis of the vertebral bodies from the C4 to C6 levels, similar to prior. No acute fracture or traumatic malalignment. DEGENERATIVE CHANGES: Disc space narrowing at multiple levels. Disc osteophyte complex at C6-C7 resulting in moderate spinal canal stenosis, similar to prior. Facet arthrosis and uncovertebral hypertrophy at multiple levels. Foraminal stenosis at multiple levels throughout the cervical  spine. SOFT TISSUES: No prevertebral soft tissue swelling. LUNGS: Biapical pleural scarring. IMPRESSION: 1. No evidence of acute traumatic injury. 2. Degenerative changes as above. Electronically signed by: Donnice Mania MD 03/15/2024 11:32 AM EST RP Workstation: [id number]   CT Head Wo Contrast Result Date: 03/15/2024 EXAM: CT HEAD WITHOUT CONTRAST 03/15/2024 11:16:00 AM TECHNIQUE: CT of the head was performed without the administration of intravenous contrast. Automated exposure control, iterative reconstruction, and/or weight based adjustment of the mA/kV was utilized to reduce the radiation dose to as low as reasonably achievable. COMPARISON: 02/26/2024 CLINICAL HISTORY: Head trauma, moderate-severe. FINDINGS: BRAIN AND VENTRICLES: No acute hemorrhage. No evidence of acute infarct. No hydrocephalus. No extra-axial collection. No mass effect or midline shift. Similar remote left MCA territory infarct. Similar appearance of chronic microvascular ischemic changes and mild parenchymal volume loss. Chronic lacunar infarcts in the bilateral basal ganglia and cerebellum. Atherosclerosis at the skull base. ORBITS: Bilateral lens replacements. SINUSES: No acute abnormality. SOFT TISSUES AND SKULL: Right frontal scalp hematoma. No skull fracture. IMPRESSION: 1. No acute intracranial abnormality related to the head trauma. 2. Right frontal scalp hematoma. 3. Similar remote left MCA territory infarct and chronic lacunar infarcts in the bilateral basal ganglia and cerebellum. Electronically signed by: Donnice Mania MD 03/15/2024 11:27 AM EST RP Workstation: [id number]    Microbiology: Results for orders placed or performed during the hospital encounter of 03/20/24  Resp panel by RT-PCR (RSV, Flu A&B, Covid) Anterior Nasal Swab     Status: None   Collection Time: 03/20/24  1:55 PM   Specimen: Anterior Nasal Swab  Result Value  Ref Range Status   SARS Coronavirus 2 by RT PCR NEGATIVE NEGATIVE Final   Influenza A by  PCR NEGATIVE NEGATIVE Final   Influenza B by PCR NEGATIVE NEGATIVE Final    Comment: (NOTE) The Xpert Xpress SARS-CoV-2/FLU/RSV plus assay is intended as an aid in the diagnosis of influenza from Nasopharyngeal swab specimens and should not be used as a sole basis for treatment. Nasal washings and aspirates are unacceptable for Xpert Xpress SARS-CoV-2/FLU/RSV testing.  Fact Sheet for Patients: bloggercourse.com  Fact Sheet for Healthcare Providers: seriousbroker.it  This test is not yet approved or cleared by the [place]  FDA and has been authorized for detection and/or diagnosis of SARS-CoV-2 by FDA under an Emergency Use Authorization (EUA). This EUA will remain in effect (meaning this test can be used) for the duration of the COVID-19 declaration under Section 564(b)(1) of the Act, 21 U.S.C. section 360bbb-3(b)(1), unless the authorization is terminated or revoked.     Resp Syncytial Virus by PCR NEGATIVE NEGATIVE Final    Comment: (NOTE) Fact Sheet for Patients: bloggercourse.com  Fact Sheet for Healthcare Providers: seriousbroker.it  This test is not yet approved or cleared by the [place]  FDA and has been authorized for detection and/or diagnosis of SARS-CoV-2 by FDA under an Emergency Use Authorization (EUA). This EUA will remain in effect (meaning this test can be used) for the duration of the COVID-19 declaration under Section 564(b)(1) of the Act, 21 U.S.C. section 360bbb-3(b)(1), unless the authorization is terminated or revoked.  Performed at [facility], [address] [place], [place] [address]   Respiratory (~20 pathogens) panel by PCR     Status: None   Collection Time: 03/20/24  2:31 PM   Specimen: Nasopharyngeal Swab; Respiratory  Result Value Ref Range Status   Adenovirus NOT DETECTED NOT DETECTED Final   Coronavirus 229E NOT DETECTED  NOT DETECTED Final    Comment: (NOTE) The Coronavirus on the Respiratory Panel, DOES NOT test for the novel  Coronavirus (2019 nCoV)    Coronavirus HKU1 NOT DETECTED NOT DETECTED Final   Coronavirus NL63 NOT DETECTED NOT DETECTED Final   Coronavirus OC43 NOT DETECTED NOT DETECTED Final   Metapneumovirus NOT DETECTED NOT DETECTED Final   Rhinovirus / Enterovirus NOT DETECTED NOT DETECTED Final   Influenza A NOT DETECTED NOT DETECTED Final   Influenza B NOT DETECTED NOT DETECTED Final   Parainfluenza Virus 1 NOT DETECTED NOT DETECTED Final   Parainfluenza Virus 2 NOT DETECTED NOT DETECTED Final   Parainfluenza Virus 3 NOT DETECTED NOT DETECTED Final   Parainfluenza Virus 4 NOT DETECTED NOT DETECTED Final   Respiratory Syncytial Virus NOT DETECTED NOT DETECTED Final   Bordetella pertussis NOT DETECTED NOT DETECTED Final   Bordetella Parapertussis NOT DETECTED NOT DETECTED Final   Chlamydophila pneumoniae NOT DETECTED NOT DETECTED Final   Mycoplasma pneumoniae NOT DETECTED NOT DETECTED Final    Comment: Performed at [facility], [address] [place], [place] [address]    Labs: CBC: Recent Labs  Lab 03/20/24 1355 03/20/24 1408 03/21/24 0345  WBC 12.7*  --  10.2  HGB 13.7 11.9* 12.3  HCT 42.6 35.0* 37.2  MCV 97.5  --  97.1  PLT 202  --  180   Basic Metabolic Panel: Recent Labs  Lab 03/20/24 1355 03/20/24 1408 03/21/24 0345  NA 137 141 136  K 5.1 4.3 4.4  CL 102 106 102  CO2 25  --  23  GLUCOSE 125* 108* 111*  BUN 28* 27* 27*  CREATININE 1.11* 1.00 1.02*  CALCIUM  9.5  --  9.2  MG 2.1  --   --    Liver Function Tests: Recent Labs  Lab 03/20/24 1355 03/21/24 0345  AST 65* 42*  ALT 46* 37  ALKPHOS 147* 120  BILITOT 0.5 0.6  PROT 7.1 6.2*  ALBUMIN 4.6 4.0   CBG: No results for input(s): GLUCAP in the last 168 hours.  Discharge time spent: 36 minutes.   Signed: Elgie Butter, MD [facility] 03/25/2024 "

## 2024-03-25 NOTE — TOC Transition Note (Signed)
 Transition of Care [facility]) - Discharge Note   Patient Details  Name: Theresa [name] MRN: [id number] Date of Birth: [birth year]  Transition of Care [facility]) CM/SW Contact:  Bridget Cordella Simmonds, LCSW Phone Number: 03/25/2024, 3:08 PM   Clinical Narrative:   Pt discharging to [facility], room 2 nursing unit. RN call report to [phone] ext [phone] or [phone].   Clancy Schmitz transportation to arrive at [facility]	[place] entrance at 530. POA Rosaline will be here at 5pm to coordinate. [phone].    Final next level of care: Skilled Nursing Facility Barriers to Discharge: Barriers Resolved   Patient Goals and CMS Choice     Choice offered to / list presented to : [facility] POA / Guardian Everlina Sharps, [place])      Discharge Placement              Patient chooses bed at: [facility] Patient to be transferred to facility by: Clancy Schmitz transport Name of family member notified: POA Michelle Patient and family notified of of transfer: 03/25/24  Discharge Plan and Services Additional resources added to the After Visit Summary for   In-house Referral: Clinical Social Work   Post Acute Care Choice: Skilled Nursing Facility                               Social Drivers of Health (SDOH) Interventions SDOH Screenings   Food Insecurity: No Food Insecurity (03/21/2024)  Housing: Low Risk (03/21/2024)  Transportation Needs: No Transportation Needs (03/21/2024)  Utilities: Not At Risk (03/21/2024)  Depression (PHQ2-9): Low Risk (11/19/2023)  Financial Resource Strain: Low Risk (03/02/2023)  Physical Activity: Insufficiently Active (03/02/2023)  Social Connections: Moderately Isolated (03/21/2024)  Stress: No Stress Concern Present (03/02/2023)  Tobacco Use: Medium Risk (02/16/2024)  Health Literacy: Adequate Health Literacy (03/02/2023)     Readmission Risk Interventions     No data to display

## 2024-03-25 NOTE — Progress Notes (Signed)
 Mobility Specialist Progress Note:   03/25/24 1051  Mobility  Activity Ambulated with assistance (In hallway)  Level of Assistance Contact guard assist, steadying assist  Assistive Device Front wheel walker  Distance Ambulated (ft) 100 ft  Activity Response Tolerated well  Mobility Referral Yes  Mobility visit 1 Mobility  Mobility Specialist Start Time (ACUTE ONLY) 1039  Mobility Specialist Stop Time (ACUTE ONLY) 1051  Mobility Specialist Time Calculation (min) (ACUTE ONLY) 12 min   Received pt in bed and agreeable to mobility. Pt required MinG and VC w/ RW management d/t not having glasses. C/o RLE discomfort, otherwise tolerated well. Returned to room without fault. Left pt in bed with alarm on. Personal belongings and call light within reach. All needs met.  Lavanda Pollack Mobility Specialist  Please contact via [employer] or  Rehab Office [phone]

## 2024-03-25 NOTE — Plan of Care (Signed)

## 2024-03-28 ENCOUNTER — Non-Acute Institutional Stay (SKILLED_NURSING_FACILITY): Payer: Self-pay | Admitting: Nurse Practitioner

## 2024-03-28 ENCOUNTER — Encounter: Payer: Self-pay | Admitting: Nurse Practitioner

## 2024-03-28 DIAGNOSIS — I10 Essential (primary) hypertension: Secondary | ICD-10-CM | POA: Diagnosis not present

## 2024-03-28 DIAGNOSIS — E039 Hypothyroidism, unspecified: Secondary | ICD-10-CM | POA: Diagnosis not present

## 2024-03-28 DIAGNOSIS — E785 Hyperlipidemia, unspecified: Secondary | ICD-10-CM

## 2024-03-28 DIAGNOSIS — K5901 Slow transit constipation: Secondary | ICD-10-CM | POA: Diagnosis not present

## 2024-03-28 DIAGNOSIS — N183 Chronic kidney disease, stage 3 unspecified: Secondary | ICD-10-CM

## 2024-03-28 DIAGNOSIS — G40909 Epilepsy, unspecified, not intractable, without status epilepticus: Secondary | ICD-10-CM | POA: Diagnosis not present

## 2024-03-28 DIAGNOSIS — I480 Paroxysmal atrial fibrillation: Secondary | ICD-10-CM

## 2024-03-28 DIAGNOSIS — F3342 Major depressive disorder, recurrent, in full remission: Secondary | ICD-10-CM | POA: Diagnosis not present

## 2024-03-28 DIAGNOSIS — Z8673 Personal history of transient ischemic attack (TIA), and cerebral infarction without residual deficits: Secondary | ICD-10-CM | POA: Diagnosis not present

## 2024-03-28 DIAGNOSIS — R296 Repeated falls: Secondary | ICD-10-CM

## 2024-03-28 DIAGNOSIS — G3184 Mild cognitive impairment, so stated: Secondary | ICD-10-CM

## 2024-03-28 NOTE — Assessment & Plan Note (Signed)
"   Hx of CVA/TIA, 03/21/24 MRI: 1. No evidence of acute or recent infarction. 2. Focal encephalomalacia within the left frontal lobe with gliotic changes in the frontal and parietal lobes. 3. Chronic lacunar infarcts within the basal ganglia bilaterally, the left thalamus, and bilateral cerebellar hemispheres. 4. Moderate periventricular and deep cerebral white matter disease.               Followed by Neurology "

## 2024-03-28 NOTE — Assessment & Plan Note (Signed)
 on Lexapro , Na 136 03/21/24

## 2024-03-28 NOTE — Assessment & Plan Note (Signed)
 taking lacosamide , followed by neurology, negative EEG in hospital 03/2024

## 2024-03-28 NOTE — Progress Notes (Unsigned)
 " Location:   [facility] Room Number: 2 Place of Service:  SNF (31) Provider: [facility] Lateka Rady NP  Charlanne Fredia CROME, MD  Patient Care Team: Charlanne Fredia CROME, MD as PCP - General (Internal Medicine) Delford Maude BROCKS, MD as PCP - Cardiology (Cardiology) Rosan Credit, MD as Consulting Physician (Ophthalmology) Sharalyn, Janina GRADE, MD as Referring Physician (Ophthalmology) Buck Saucer, MD as Consulting Physician (Neurology) Charlanne Fredia CROME, MD as Consulting Physician (Internal Medicine)  Extended Emergency Contact Information Primary Emergency Contact: [facility] Phone: [phone] Relation: None Secondary Emergency Contact: Heyge,Lorna Mobile Phone: [phone] Relation: Friend Interpreter needed? No  Code Status:  DNR Goals of care: Advanced Directive information    03/20/2024    1:22 PM  Advanced Directives  Does Patient Have a Medical Advance Directive? No     Chief Complaint  Patient presents with   Acute Visit    Following hospital stay    HPI:  Pt is a 89 y.o. female seen today for an acute visit for follow-up of hospital stay  Hospitalized 03/20/2024 to 03/25/2024 for fall, acute encephalopathy(deemed to be progression of cognitive impairment and dehydration)  Lab workup included CMP with BUN 28, creatinine 1.11, glucose 125, AST 65, ALT 46, alk phos 147.  CBC with leukocytosis of 12.7, normal TSH, Vit B12, ammonia level.   Respiratory panel for flu COVID RSV negative.  Urinalysis with protein, leukocytes, no bacteria.  Ammonia negative. Chest x-ray, pelvis x-ray, CT head and CT cervical spine negative for acute abnormalities  MRI brain is also negative for any acute findings, negative EEG  Frequent falls, forgets using walking, increased frailty are contributory             Hx of CVA/TIA, 03/21/24 MRI: 1. No evidence of acute or recent infarction. 2. Focal encephalomalacia within the left frontal lobe with gliotic changes in the frontal and  parietal lobes. 3. Chronic lacunar infarcts within the basal ganglia bilaterally, the left thalamus, and bilateral cerebellar hemispheres. 4. Moderate periventricular and deep cerebral white matter disease.               Followed by Neurology             Afib, heart rate is in control, taking metoprolol , Eliquis              Seizures, taking lacosamide , followed by neurology, negative EEG in hospital 03/2024              HLD, taking Crestor              CKD, Bun/creat 27/1.02 03/22/23             Depression, on Lexapro , Na 136 03/21/24             Hypothyroidism, taking levothyroxine , TSH 1.070 03/21/24             Constipation, stable, taking MiraLAX , Colace             HTN, intermittent elevated, asymptomatic, on Metoprolol .   GERD, Hgb 12.3 03/21/24, stable.   MCI, needs higher level care for safety                              Past Medical History:  Diagnosis Date   Atrial fibrillation (HCC)    AFIB   Collagenous colitis    CVA (cerebral vascular accident) (HCC)    a.  L parietal CVA by MRI 12/12, likely  embolic;  b.  TEE by report with neg bubble study;  c.  carotid dopplers  12/12:  + plaque, no sig ICA stenosis    History of postmenopausal HRT    Hx of adenomatous colonic polyps    Hx of ischemic left MCA stroke 04/08/2011   Long term numbness in right hand in 1st 2 fingers, as well as slowed speech  Atorvastatin , Xarelto  20mg . -? If stroke from a fib      Hypothyroidism    MVP (mitral valve prolapse)    a. s/p MV repair at [facility] in 2008;  b. Echocardiogram 7/12: EF 50%, status post mitral valve repair with mild MR and minimal MS, mean gradient 4, moderate LAE, LA diam 55 mm; mild RAE, PASP 28-32;    Osteoarthritis    Osteoporosis    Past Surgical History:  Procedure Laterality Date   ANTERIOR APPROACH HEMI HIP ARTHROPLASTY Right 03/05/2023   Procedure: ANTERIOR APPROACH HEMI HIP ARTHROPLASTY;  Surgeon: Fidel Rogue, MD;  Location: [facility] ORS;  Service: Orthopedics;   Laterality: Right;   CARDIOVERSION  01/09/2012   Procedure: CARDIOVERSION;  Surgeon: Toribio JONELLE Fuel, MD;  Location: [facility] ENDOSCOPY;  Service: Cardiovascular;  Laterality: N/A;   CATARACT EXTRACTION     CHOLECYSTECTOMY     DILATION AND CURETTAGE OF UTERUS     LUMBAR FUSION     MITRAL VALVE REPAIR  03/17/2006   mitral valve repair CE ring and maze procedure   TONSILLECTOMY      Allergies[1]  Allergies as of 03/28/2024       Reactions   Tape Other (See Comments)   Skin tears and bruises easily.   Vioxx [rofecoxib] Other (See Comments)   Elevated LFT's        Medication List        Accurate as of March 28, 2024 11:59 PM. If you have any questions, ask your nurse or doctor.          amoxicillin  500 MG capsule Commonly known as: AMOXIL  Take 2,000 mg by mouth as needed (dental Appointment). Give 2000 mg by mouth as needed for dental appointments AM of dental appointments   Citracal Petites/Vitamin D  200-6.25 MG-MCG Tabs Generic drug: Calcium  Citrate-Vitamin D  Take 1 tablet by mouth 2 (two) times daily.   docusate sodium  100 MG capsule Commonly known as: COLACE Take 1 capsule (100 mg total) by mouth 2 (two) times daily.   Eliquis  2.5 MG Tabs tablet Generic drug: apixaban  Take 1 tablet (2.5 mg total) by mouth 2 (two) times daily.   escitalopram  10 MG tablet Commonly known as: LEXAPRO  Take 15 mg by mouth daily.   fluticasone  50 MCG/ACT nasal spray Commonly known as: FLONASE  Place 2 sprays into both nostrils daily.   glucosamine-chondroitin 500-400 MG tablet Take 1 tablet by mouth 2 (two) times daily.   Lacosamide  100 MG Tabs Take 1 tablet (100 mg total) by mouth in the morning and at bedtime.   levothyroxine  75 MCG tablet Commonly known as: SYNTHROID  TAKE 1 TABLET BY MOUTH EVERY DAY BEFORE BREAKFAST   loratadine  10 MG tablet Commonly known as: CLARITIN  Take 1 tablet (10 mg total) by mouth daily.   metoprolol  tartrate 25 MG tablet Commonly  known as: LOPRESSOR  Take 1 tablet (25 mg total) by mouth 2 (two) times daily.   polyethylene glycol 17 g packet Commonly known as: MIRALAX  / GLYCOLAX  Take 17 g by mouth daily as needed for mild constipation.   PRESERVISION AREDS 2 PO Take 1 capsule  by mouth 2 (two) times daily. Reported on 09/04/2015   rosuvastatin  10 MG tablet Commonly known as: CRESTOR  Take 10 mg by mouth once. 10 mg by mouth in the evening for HLD   Simply Saline 0.9 % Aers Generic drug: Saline Place 2 each into the nose daily as needed.   Tylenol  8 Hour Arthritis Pain 650 MG CR tablet Generic drug: acetaminophen  Take 650 mg by mouth daily.   vitamin A 10000 UNIT capsule Take 1 capsule by mouth daily.   zinc  oxide 20 % ointment Apply 1 Application topically See admin instructions. Apply topically to peri/buttocks as needed after each incontinent episode for skin protection.        Review of Systems  Constitutional:  Negative for appetite change, fatigue and fever.  HENT:  Negative for congestion and trouble swallowing.   Eyes:  Positive for visual disturbance.  Respiratory:  Negative for cough.   Cardiovascular:  Negative for chest pain, palpitations and leg swelling.  Gastrointestinal:  Negative for abdominal pain, constipation, nausea and vomiting.  Genitourinary:  Negative for dysuria, frequency and urgency.  Musculoskeletal:  Positive for arthralgias.  Skin:  Negative for color change.  Neurological:  Positive for seizures. Negative for dizziness, speech difficulty, weakness and headaches.  Psychiatric/Behavioral:  Negative for confusion and sleep disturbance. The patient is not nervous/anxious.     Immunization History  Administered Date(s) Administered   Fluad Quad(high Dose 65+) 11/30/2018, 01/17/2020   Fluad Trivalent(High Dose 65+) 03/02/2023   INFLUENZA, HIGH DOSE SEASONAL PF 12/22/2012, 03/06/2015, 11/26/2015, 12/09/2017, 11/17/2022   Influenza Split 12/17/2010, 12/02/2011    Influenza Whole 03/17/2001, 12/18/2006, 12/28/2007, 01/03/2009, 12/26/2009   Influenza,inj,Quad PF,6+ Mos 11/23/2013   Influenza-Unspecified 11/20/2016   PFIZER(Purple Top)SARS-COV-2 Vaccination 06/09/2019, 06/30/2019, 02/16/2020   Pneumococcal Conjugate-13 03/02/2014   Pneumococcal Polysaccharide-23 03/17/2000, 09/16/2006   Td 03/18/1995, 09/15/2006, 02/22/2021   Tdap 12/17/2010   Unspecified SARS-COV-2 Vaccination 12/08/2020, 01/12/2024   Zoster Recombinant(Shingrix) 09/18/2016, 11/20/2016   Pertinent  Health Maintenance Due  Topic Date Due   Influenza Vaccine  10/16/2023   Bone Density Scan  Completed      02/11/2022   11:26 AM 05/26/2022   11:09 AM 03/02/2023    3:17 PM 05/18/2023   11:29 AM 11/19/2023    6:09 PM  Fall Risk  Falls in the past year? 1 1 1 1 1   Was there an injury with Fall? 1  1  0  1  1   Was there an injury with Fall? - Comments fractured rib       Fall Risk Category Calculator 3 3 2 2 3   Fall Risk Category (Retired) High       (RETIRED) Patient Fall Risk Level Low fall risk       Patient at Risk for Falls Due to No Fall Risks History of fall(s) Impaired balance/gait;Impaired mobility;History of fall(s) History of fall(s);Impaired balance/gait;Impaired mobility History of fall(s);Impaired balance/gait;Impaired mobility  Fall risk Follow up Falls evaluation completed  Falls evaluation completed Falls prevention discussed Falls evaluation completed;Education provided Falls evaluation completed;Education provided     Data saved with a previous flowsheet row definition   Functional Status Survey:    Vitals:   03/28/24 1314 03/29/24 1316  BP: (!) 136/92 (!) 136/92  Pulse: 74   Resp: 20   Temp: (!) 97.3 F (36.3 C)   SpO2: 95%   Weight: 113 lb 12.8 oz (51.6 kg)    Body mass index is 21.5 kg/m. Physical Exam Constitutional:  Appearance: Normal appearance.  HENT:     Head: Normocephalic and atraumatic.     Nose: Nose normal.      Mouth/Throat:     Mouth: Mucous membranes are moist.  Eyes:     Extraocular Movements: Extraocular movements intact.     Conjunctiva/sclera: Conjunctivae normal.     Pupils: Pupils are equal, round, and reactive to light.  Cardiovascular:     Rate and Rhythm: Normal rate. Rhythm irregular.     Heart sounds: No murmur heard.    Comments: Raynaud's  Pulmonary:     Breath sounds: No wheezing or rales.  Abdominal:     General: Bowel sounds are normal.     Palpations: Abdomen is soft.     Tenderness: There is no abdominal tenderness. There is no right CVA tenderness.  Musculoskeletal:        General: No tenderness.     Right lower leg: No edema.     Left lower leg: No edema.  Skin:    General: Skin is warm and dry.  Neurological:     General: No focal deficit present.     Mental Status: She is alert and oriented to person, place, and time. Mental status is at baseline.     Motor: No weakness.     Gait: Gait abnormal.  Psychiatric:        Mood and Affect: Mood normal.        Behavior: Behavior normal.        Thought Content: Thought content normal.     Labs reviewed: Recent Labs    04/08/23 0439 05/14/23 0948 02/15/24 0959 02/15/24 1026 03/20/24 1355 03/20/24 1408 03/21/24 0345  NA  --    < > 139   < > 137 141 136  K  --    < > 4.4   < > 5.1 4.3 4.4  CL  --    < > 103   < > 102 106 102  CO2  --    < > 28  --  25  --  23  GLUCOSE  --    < > 126*   < > 125* 108* 111*  BUN  --    < > 24*   < > 28* 27* 27*  CREATININE  --    < > 1.15*   < > 1.11* 1.00 1.02*  CALCIUM   --    < > 9.3  --  9.5  --  9.2  MG 2.0  --   --   --  2.1  --   --    < > = values in this interval not displayed.   Recent Labs    05/14/23 0948 03/20/24 1355 03/21/24 0345  AST 29 65* 42*  ALT 25 46* 37  ALKPHOS 127* 147* 120  BILITOT 0.9 0.5 0.6  PROT 6.5 7.1 6.2*  ALBUMIN 3.8 4.6 4.0   Recent Labs    04/06/23 1326 05/14/23 0948 05/14/23 0954 02/15/24 0959 02/15/24 1026 03/20/24 1355  03/20/24 1408 03/21/24 0345  WBC 10.5 9.3   < > 9.6  --  12.7*  --  10.2  NEUTROABS 8.0* 7.2  --  6.9  --   --   --   --   HGB 11.9* 12.7   < > 13.4   < > 13.7 11.9* 12.3  HCT 37.9 39.8   < > 41.6   < > 42.6 35.0* 37.2  MCV 101.6* 97.8   < >  96.5  --  97.5  --  97.1  PLT 191 173   < > 188  --  202  --  180   < > = values in this interval not displayed.   Lab Results  Component Value Date   TSH 1.070 03/21/2024   Lab Results  Component Value Date   HGBA1C 5.5 03/06/2023   Lab Results  Component Value Date   CHOL 132 04/07/2023   HDL 66 04/07/2023   LDLCALC 57 04/07/2023   LDLDIRECT 47.0 09/04/2015   TRIG 43 04/07/2023   CHOLHDL 2.0 04/07/2023    Significant Diagnostic Results in last 30 days:  EEG adult Result Date: 03/21/2024 Shelton Arlin KIDD, MD     03/21/2024  9:33 AM Patient Name: Chasty Randal MRN: [id number] Epilepsy Attending: Arlin KIDD Shelton Referring Physician/Provider: Seena Marsa NOVAK, MD Date: 03/21/2024 Duration: 22.27 mins Patient history: 89yo F with decline in mental status over the last 24 hours with new word finding difficulty and disorientation. EEG to evaluate for seizure Level of alertness: Awake, drowsy AEDs during EEG study: None Technical aspects: This EEG study was done with scalp electrodes positioned according to the 10-20 International system of electrode placement. Electrical activity was reviewed with band pass filter of 1-70Hz , sensitivity of 7 uV/mm, display speed of 53mm/sec with a 60Hz  notched filter applied as appropriate. EEG data were recorded continuously and digitally stored.  Video monitoring was available and reviewed as appropriate. Description: The posterior dominant rhythm consists of 8 Hz activity of moderate voltage (25-35 uV) seen predominantly in posterior head regions, symmetric and reactive to eye opening and eye closing. Drowsiness was characterized by attenuation of the posterior background rhythm. EEG showed intermittent  generalized 3-6hz  theta-delta slowing admixed with 15 to 18 Hz beta activity distributed symmetrically and diffusely. Hyperventilation and photic stimulation were not performed.   ABNORMALITY - Intermittent slow, generalized  IMPRESSION: This study is suggestive of mild diffuse encephalopathy. No seizures or epileptiform discharges were seen throughout the recording.  Arlin KIDD Shelton    MR BRAIN WO CONTRAST Result Date: 03/21/2024 EXAM: MRI BRAIN WITHOUT CONTRAST 03/21/2024 03:35:28 AM TECHNIQUE: Multiplanar multisequence MRI of the head/brain was performed without the administration of intravenous contrast. COMPARISON: CT of the head dated 03/20/2024 and MRI of the head dated 05/14/2023. CLINICAL HISTORY: Neuro deficit, acute, stroke suspected. FINDINGS: BRAIN AND VENTRICLES: There is no restricted diffusion to indicate acute or recent infarction. No intracranial hemorrhage. No mass. No midline shift. No hydrocephalus. The sella is unremarkable. Normal flow voids. There are focal encephalomalacia changes within the left frontal operculum. There are gliotic changes within the left frontal and parietal lobes. Chronic lacunar infarcts are again noted within the basal ganglia bilaterally, the left thalamus, and bilateral cerebellar hemispheres. There is moderate periventricular and deep cerebral white matter disease. ORBITS: The patient is status post bilateral lens replacement. SINUSES AND MASTOIDS: No acute abnormality. BONES AND SOFT TISSUES: Normal marrow signal. No acute soft tissue abnormality. IMPRESSION: 1. No evidence of acute or recent infarction. 2. Focal encephalomalacia within the left frontal lobe with gliotic changes in the frontal and parietal lobes. 3. Chronic lacunar infarcts within the basal ganglia bilaterally, the left thalamus, and bilateral cerebellar hemispheres. 4. Moderate periventricular and deep cerebral white matter disease. Electronically signed by: Evalene Coho MD 03/21/2024 04:05  AM EST RP Workstation: [id number]   DG Abd 1 View Result Date: 03/21/2024 EXAM: 1 VIEW XRAY OF THE ABDOMEN 03/21/2024 01:39:00 AM COMPARISON: None available. CLINICAL HISTORY:  Encounter for imaging to screen for metal prior to MRI. FINDINGS: BOWEL: Nonobstructive bowel gas pattern. SOFT TISSUES: Residual contrast material in the renal collecting system and bladder. Surgical clips in the right upper quadrant. Cardiac valve prosthesis. No radiopaque foreign bodies are demonstrated within the field of view, although residual contrast material could obscure foreign bodies in those areas. BONES: Postoperative changes with rod and screw fixation of the lumbar spine. Right hip hemiarthroplasty. Degenerative changes in the lumbar spine. Scoliosis convex towards the right. IMPRESSION: 1. No radiopaque foreign bodies identified within the field of view, although residual contrast material could obscure foreign bodies. 2. Postsurgical metallic hardware including lumbar rod and screw fixation, right hip hemiarthroplasty, cardiac valve prosthesis, and right upper quadrant surgical clips. Electronically signed by: Elsie Gravely MD 03/21/2024 01:46 AM EST RP Workstation: [id number]   CT Angio Chest PE W and/or Wo Contrast Result Date: 03/20/2024 EXAM: CTA CHEST 03/20/2024 11:23:05 PM TECHNIQUE: CTA of the chest was performed after the administration of intravenous contrast. Multiplanar reformatted images are provided for review. MIP images are provided for review. Automated exposure control, iterative reconstruction, and/or weight based adjustment of the mA/kV was utilized to reduce the radiation dose to as low as reasonably achievable. COMPARISON: None available. CLINICAL HISTORY: Pulmonary embolism (PE) suspected, high prob. FINDINGS: PULMONARY ARTERIES: Pulmonary arteries are adequately opacified for evaluation. No acute pulmonary embolus. Main pulmonary artery is normal in caliber. MEDIASTINUM: Cardiomegaly. Aortic  atherosclerosis. The pericardium demonstrates no acute abnormality. There is no acute abnormality of the thoracic aorta. LYMPH NODES: No mediastinal, hilar or axillary lymphadenopathy. LUNGS AND PLEURA: Mosaic attenuation throughout the lungs compatible with air trapping. Peripheral ground glass opacities in the lower lobes, likely fibrosis. No acute confluent airspace opacities. No effusions. No pneumothorax. UPPER ABDOMEN: Limited images of the upper abdomen are unremarkable. SOFT TISSUES AND BONES: No acute bone or soft tissue abnormality. IMPRESSION: 1. No pulmonary embolism. 2. Mosaic attenuation throughout the lungs compatible with air trapping. 3. Peripheral ground glass opacities in the lower lobes, likely fibrosis. 4. Cardiomegaly and aortic atherosclerosis. Electronically signed by: Franky Crease MD 03/20/2024 11:28 PM EST RP Workstation: [id number]   CT CERVICAL SPINE WO CONTRAST Result Date: 03/20/2024 CLINICAL DATA:  Trauma EXAM: CT CERVICAL SPINE WITHOUT CONTRAST TECHNIQUE: Multidetector CT imaging of the cervical spine was performed without intravenous contrast. Multiplanar CT image reconstructions were also generated. RADIATION DOSE REDUCTION: This exam was performed according to the departmental dose-optimization program which includes automated exposure control, adjustment of the mA and/or kV according to patient size and/or use of iterative reconstruction technique. COMPARISON:  CT 03/15/2024, 02/15/2024 FINDINGS: Alignment: Reversal of cervical lordosis as before. Trace anterolisthesis C3 on C4 and C7 on T1. Similar fused anterolisthesis C4 on C5. Stable facet alignment with chronic, fused slightly anterior positioning of the right C4 facet. Skull base and vertebrae: No acute fracture is seen. Interbody fusion C4-C5 and C5-C6 with facet ankylosis at C4-C5. Soft tissues and spinal canal: No prevertebral fluid or swelling. No visible canal hematoma. Disc levels: Interbody fusion C4 through C6.  Advanced disc space narrowing and degenerative change C3-C4 and C6-C7. At least moderate spinal stenosis at C6-C7. Facet degenerative change at multiple levels with foraminal narrowing. Upper chest: Negative. Other: None IMPRESSION: 1. No acute osseous abnormality. 2. Similar reversal of cervical lordosis and degenerative changes as above. Suspected at least moderate spinal stenosis at C6-C7 Electronically Signed   By: Luke Bun M.D.   On: 03/20/2024 15:23   CT HEAD WO CONTRAST  Result Date: 03/20/2024 CLINICAL DATA:  Head trauma EXAM: CT HEAD WITHOUT CONTRAST TECHNIQUE: Contiguous axial images were obtained from the base of the skull through the vertex without intravenous contrast. RADIATION DOSE REDUCTION: This exam was performed according to the departmental dose-optimization program which includes automated exposure control, adjustment of the mA and/or kV according to patient size and/or use of iterative reconstruction technique. COMPARISON:  CT 03/15/2024, 02/15/2024 FINDINGS: Brain: No acute territorial infarction, hemorrhage or intracranial mass. Chronic left MCA infarct. Atrophy and chronic small vessel ischemic changes of the white matter. Small chronic cerebellar infarcts. Chronic lacunar infarcts in the basal ganglia. Stable ventricle size. Vascular: No hyperdense vessels.  Carotid vascular calcification Skull: No fracture Sinuses/Orbits: No acute finding. Other: Small right frontal scalp hematoma, decreased compared to prior. IMPRESSION: 1. No CT evidence for acute intracranial abnormality. 2. Atrophy and chronic small vessel ischemic changes of the white matter. Chronic left MCA infarct and small chronic cerebellar and basal ganglial infarcts Electronically Signed   By: Luke Bun M.D.   On: 03/20/2024 15:11   DG Pelvis 1-2 Views Result Date: 03/20/2024 CLINICAL DATA:  [id number] Trauma [id number] EXAM: PELVIS - 1-2 VIEW COMPARISON:  March 03, 2023, February 15, 2024 FINDINGS: Osteopenia. No acute  fracture or dislocation. Partial visualization of orthopedic hardware status post posterior fixation of the lower lumbar spine and RIGHT hip arthroplasty. Sacrum is obscured by overlapping bowel contents. IMPRESSION: 1. No acute fracture or dislocation. 2. If there is a persistent clinical concern for nondisplaced hip or pelvic fracture, recommend dedicated pelvic CT or MRI. Electronically Signed   By: Corean Salter M.D.   On: 03/20/2024 13:55   DG Chest 1 View Result Date: 03/20/2024 CLINICAL DATA:  [id number] Trauma [id number] EXAM: CHEST  1 VIEW COMPARISON:  February 15, 2024, March 03, 2023 FINDINGS: The cardiomediastinal silhouette is unchanged and enlarged in contour.Status post valve repair. Atherosclerotic calcifications of the tortuous aorta. No pleural effusion. No pneumothorax. No acute pleuroparenchymal abnormality. Remote LEFT-sided rib fractures. IMPRESSION: No acute cardiopulmonary abnormality. Electronically Signed   By: Corean Salter M.D.   On: 03/20/2024 13:53   CT Cervical Spine Wo Contrast Result Date: 03/15/2024 EXAM: CT CERVICAL SPINE WITHOUT CONTRAST 03/15/2024 11:16:00 AM TECHNIQUE: CT of the cervical spine was performed without the administration of intravenous contrast. Multiplanar reformatted images are provided for review. Automated exposure control, iterative reconstruction, and/or weight based adjustment of the mA/kV was utilized to reduce the radiation dose to as low as reasonably achievable. COMPARISON: 02/15/2024 CLINICAL HISTORY: Neck trauma (Age >= 65y) FINDINGS: BONES AND ALIGNMENT: Similar alignment of the cervical spine with slight reversal of the cervical lordosis. There is similar degenerative anterolisthesis of C3 on C4, C4 on C5, and C7 on T1. There is additional similar trace degenerative retrolisthesis of C5 on C6. There is ankylosis of the vertebral bodies from the C4 to C6 levels, similar to prior. No acute fracture or traumatic malalignment. DEGENERATIVE  CHANGES: Disc space narrowing at multiple levels. Disc osteophyte complex at C6-C7 resulting in moderate spinal canal stenosis, similar to prior. Facet arthrosis and uncovertebral hypertrophy at multiple levels. Foraminal stenosis at multiple levels throughout the cervical spine. SOFT TISSUES: No prevertebral soft tissue swelling. LUNGS: Biapical pleural scarring. IMPRESSION: 1. No evidence of acute traumatic injury. 2. Degenerative changes as above. Electronically signed by: Donnice Mania MD 03/15/2024 11:32 AM EST RP Workstation: [id number]   CT Head Wo Contrast Result Date: 03/15/2024 EXAM: CT HEAD WITHOUT CONTRAST 03/15/2024 11:16:00 AM TECHNIQUE: CT of the  head was performed without the administration of intravenous contrast. Automated exposure control, iterative reconstruction, and/or weight based adjustment of the mA/kV was utilized to reduce the radiation dose to as low as reasonably achievable. COMPARISON: 02/26/2024 CLINICAL HISTORY: Head trauma, moderate-severe. FINDINGS: BRAIN AND VENTRICLES: No acute hemorrhage. No evidence of acute infarct. No hydrocephalus. No extra-axial collection. No mass effect or midline shift. Similar remote left MCA territory infarct. Similar appearance of chronic microvascular ischemic changes and mild parenchymal volume loss. Chronic lacunar infarcts in the bilateral basal ganglia and cerebellum. Atherosclerosis at the skull base. ORBITS: Bilateral lens replacements. SINUSES: No acute abnormality. SOFT TISSUES AND SKULL: Right frontal scalp hematoma. No skull fracture. IMPRESSION: 1. No acute intracranial abnormality related to the head trauma. 2. Right frontal scalp hematoma. 3. Similar remote left MCA territory infarct and chronic lacunar infarcts in the bilateral basal ganglia and cerebellum. Electronically signed by: Donnice Mania MD 03/15/2024 11:27 AM EST RP Workstation: [id number]    Assessment/Plan MCI (mild cognitive impairment) Hospitalized 03/20/2024 to  03/25/2024 for fall, acute encephalopathy(deemed to be progression of cognitive impairment and dehydration) needs higher level care for safety  Falls frequently forgets using walking, increased frailty are contributory Therapy to eval and tx  History of TIA (transient ischemic attack) and stroke  Hx of CVA/TIA, 03/21/24 MRI: 1. No evidence of acute or recent infarction. 2. Focal encephalomalacia within the left frontal lobe with gliotic changes in the frontal and parietal lobes. 3. Chronic lacunar infarcts within the basal ganglia bilaterally, the left thalamus, and bilateral cerebellar hemispheres. 4. Moderate periventricular and deep cerebral white matter disease.               Followed by Neurology  Paroxysmal atrial fibrillation (HCC) heart rate is in control, taking metoprolol , Eliquis   Seizure disorder (HCC) taking lacosamide , followed by neurology, negative EEG in hospital 03/2024   Hyperlipidemia taking Crestor   CKD (chronic kidney disease), stage III (HCC) Bun/creat 27/1.02 03/22/23  Major depression in full remission (HCC) on Lexapro , Na 136 03/21/24  Hypothyroidism  taking levothyroxine , TSH 1.070 03/21/24  Slow transit constipation stable, taking MiraLAX , Colace  Hypertension  intermittent elevated, asymptomatic, on Metoprolol .     Family/ staff Communication: Plan of care reviewed with the patient and charge nurse  Labs/tests ordered: None         [1] Allergies Allergen Reactions   Tape Other (See Comments)    Skin tears and bruises easily.   Vioxx [Rofecoxib] Other (See Comments)    Elevated LFT's  "

## 2024-03-28 NOTE — Assessment & Plan Note (Signed)
 taking Crestor 

## 2024-03-28 NOTE — Assessment & Plan Note (Signed)
"   taking levothyroxine , TSH 1.070 03/21/24 "

## 2024-03-28 NOTE — Assessment & Plan Note (Signed)
 heart rate is in control, taking metoprolol , Eliquis 

## 2024-03-28 NOTE — Assessment & Plan Note (Signed)
"   intermittent elevated, asymptomatic, on Metoprolol .  "

## 2024-03-28 NOTE — Assessment & Plan Note (Signed)
 forgets using walking, increased frailty are contributory Therapy to eval and tx

## 2024-03-28 NOTE — Assessment & Plan Note (Signed)
 Hospitalized 03/20/2024 to 03/25/2024 for fall, acute encephalopathy(deemed to be progression of cognitive impairment and dehydration) needs higher level care for safety

## 2024-03-28 NOTE — Assessment & Plan Note (Signed)
 stable, taking MiraLAX , Colace

## 2024-03-28 NOTE — Assessment & Plan Note (Signed)
 Bun/creat 27/1.02 03/22/23

## 2024-03-31 ENCOUNTER — Non-Acute Institutional Stay: Payer: Self-pay | Admitting: Internal Medicine

## 2024-03-31 DIAGNOSIS — E039 Hypothyroidism, unspecified: Secondary | ICD-10-CM

## 2024-03-31 DIAGNOSIS — G3184 Mild cognitive impairment, so stated: Secondary | ICD-10-CM

## 2024-03-31 DIAGNOSIS — Z8673 Personal history of transient ischemic attack (TIA), and cerebral infarction without residual deficits: Secondary | ICD-10-CM

## 2024-03-31 DIAGNOSIS — R410 Disorientation, unspecified: Secondary | ICD-10-CM

## 2024-03-31 DIAGNOSIS — E785 Hyperlipidemia, unspecified: Secondary | ICD-10-CM

## 2024-03-31 DIAGNOSIS — I480 Paroxysmal atrial fibrillation: Secondary | ICD-10-CM

## 2024-03-31 DIAGNOSIS — F3342 Major depressive disorder, recurrent, in full remission: Secondary | ICD-10-CM

## 2024-03-31 DIAGNOSIS — R296 Repeated falls: Secondary | ICD-10-CM

## 2024-03-31 DIAGNOSIS — G40909 Epilepsy, unspecified, not intractable, without status epilepticus: Secondary | ICD-10-CM

## 2024-03-31 DIAGNOSIS — N183 Chronic kidney disease, stage 3 unspecified: Secondary | ICD-10-CM

## 2024-03-31 NOTE — Progress Notes (Unsigned)
 "  Location: [facility] of Service:  SNF ([id number])  Provider:   Code Status: DNR Goals of Care:     03/20/2024    1:22 PM  Advanced Directives  Does Patient Have a Medical Advance Directive? No     Chief Complaint  Patient presents with   Acute Visit    HPI: Patient is a 89 y.o. female seen today for an acute visit for Admit in SNF  Patient was admitted in the hospital from 1/4 to 1/9 for acute encephalopathy  Before hospitalization patient was living in [place] in [facility] she has a history of recurrent embolic stroke is on Eliquis    She also has a history of A-fib, hypertension, hypothyroidism, GERD, CKD, depression  History of  fracture of right femoral neck s/p Arthroplasty in 12/24  Possible seizures EEG has shown cortical dysfunction but no seizure activity was on Vimpat   She was sent to the hospital for recurrent falls and altered mental status. Her workup in the hospital including labs flu test COVID test RSV were negative UA was negative  chest x-ray CT of the head was negative  MRI showed focal encephalomalacia in left frontal lobe with gliotic changes in frontal and parietal lobes and chronic lacunar infarcts EEG was negative  Her encephalopathy was thought to be due to worsening cognition and questionable dehydration. It was decided to now admit patient in skilled.  Since she has been here she continues to walk with her walker.  Has not had any new falls.  Her mental status was at her baseline today. She did not have any complaints  Past Medical History:  Diagnosis Date   Atrial fibrillation (HCC)    AFIB   Collagenous colitis    CVA (cerebral vascular accident) (HCC)    a.  L parietal CVA by MRI 12/12, likely embolic;  b.  TEE by report with neg bubble study;  c.  carotid dopplers  12/12:  + plaque, no sig ICA stenosis    History of postmenopausal HRT    Hx of adenomatous colonic polyps    Hx of ischemic left MCA stroke 04/08/2011   Long term numbness  in right hand in 1st 2 fingers, as well as slowed speech  Atorvastatin , Xarelto  20mg . -? If stroke from a fib      Hypothyroidism    MVP (mitral valve prolapse)    a. s/p MV repair at [facility] in 2008;  b. Echocardiogram 7/12: EF 50%, status post mitral valve repair with mild MR and minimal MS, mean gradient 4, moderate LAE, LA diam 55 mm; mild RAE, PASP 28-32;    Osteoarthritis    Osteoporosis     Past Surgical History:  Procedure Laterality Date   ANTERIOR APPROACH HEMI HIP ARTHROPLASTY Right 03/05/2023   Procedure: ANTERIOR APPROACH HEMI HIP ARTHROPLASTY;  Surgeon: Fidel Rogue, MD;  Location: [facility] ORS;  Service: Orthopedics;  Laterality: Right;   CARDIOVERSION  01/09/2012   Procedure: CARDIOVERSION;  Surgeon: Toribio JONELLE Fuel, MD;  Location: [facility] ENDOSCOPY;  Service: Cardiovascular;  Laterality: N/A;   CATARACT EXTRACTION     CHOLECYSTECTOMY     DILATION AND CURETTAGE OF UTERUS     LUMBAR FUSION     MITRAL VALVE REPAIR  03/17/2006   mitral valve repair CE ring and maze procedure   TONSILLECTOMY      Allergies[1]  Outpatient Encounter Medications as of 03/31/2024  Medication Sig   amoxicillin  (AMOXIL ) 500 MG capsule Take 2,000 mg  by mouth as needed (dental Appointment). Give 2000 mg by mouth as needed for dental appointments AM of dental appointments   apixaban  (ELIQUIS ) 2.5 MG TABS tablet Take 1 tablet (2.5 mg total) by mouth 2 (two) times daily.   Calcium  Citrate-Vitamin D  (CITRACAL PETITES/VITAMIN D ) 200-6.25 MG-MCG TABS Take 1 tablet by mouth 2 (two) times daily.   docusate sodium  (COLACE) 100 MG capsule Take 1 capsule (100 mg total) by mouth 2 (two) times daily.   escitalopram  (LEXAPRO ) 10 MG tablet Take 15 mg by mouth daily.   fluticasone  (FLONASE ) 50 MCG/ACT nasal spray Place 2 sprays into both nostrils daily.   glucosamine-chondroitin 500-400 MG tablet Take 1 tablet by mouth 2 (two) times daily.   Lacosamide  100 MG TABS Take 1 tablet (100 mg total) by mouth in the morning  and at bedtime.   levothyroxine  (SYNTHROID ) 75 MCG tablet TAKE 1 TABLET BY MOUTH EVERY DAY BEFORE BREAKFAST   loratadine  (CLARITIN ) 10 MG tablet Take 1 tablet (10 mg total) by mouth daily.   metoprolol  tartrate (LOPRESSOR ) 25 MG tablet Take 1 tablet (25 mg total) by mouth 2 (two) times daily.   Multiple Vitamins-Minerals (PRESERVISION AREDS 2 PO) Take 1 capsule by mouth 2 (two) times daily. Reported on 09/04/2015   polyethylene glycol (MIRALAX  / GLYCOLAX ) 17 g packet Take 17 g by mouth daily as needed for mild constipation.   rosuvastatin  (CRESTOR ) 10 MG tablet Take 10 mg by mouth once. 10 mg by mouth in the evening for HLD   Saline (SIMPLY SALINE) 0.9 % AERS Place 2 each into the nose daily as needed.   TYLENOL  8 HOUR ARTHRITIS PAIN 650 MG CR tablet Take 650 mg by mouth daily.   vitamin A 10000 UNIT capsule Take 1 capsule by mouth daily.   zinc  oxide 20 % ointment Apply 1 Application topically See admin instructions. Apply topically to peri/buttocks as needed after each incontinent episode for skin protection.   No facility-administered encounter medications on file as of 03/31/2024.    Review of Systems:  Review of Systems  Constitutional:  Negative for activity change and appetite change.  HENT: Negative.    Respiratory:  Negative for cough and shortness of breath.   Cardiovascular:  Negative for leg swelling.  Gastrointestinal:  Negative for constipation.  Genitourinary: Negative.   Musculoskeletal:  Positive for gait problem. Negative for arthralgias and myalgias.  Skin: Negative.   Neurological:  Negative for dizziness and weakness.  Psychiatric/Behavioral:  Positive for confusion. Negative for dysphoric mood and sleep disturbance.     Health Maintenance  Topic Date Due   Influenza Vaccine  10/16/2023   Medicare Annual Wellness (AWV)  03/01/2024   COVID-19 Vaccine (6 - Pfizer risk 2025-26 season) 07/12/2024   DTaP/Tdap/Td (5 - Td or Tdap) 02/23/2031   Pneumococcal Vaccine: 50+  Years  Completed   Bone Density Scan  Completed   Zoster Vaccines- Shingrix  Completed   Meningococcal B Vaccine  Aged Out    Physical Exam: Vitals:   03/31/24 1109  BP: (!) 136/90  Pulse: 74  Resp: 20  Temp: (!) 97.3 F (36.3 C)  Weight: 114 lb (51.7 kg)   Body mass index is 21.54 kg/m. Physical Exam Vitals reviewed.  Constitutional:      Appearance: Normal appearance.  HENT:     Head: Normocephalic.     Nose: Nose normal.     Mouth/Throat:     Mouth: Mucous membranes are moist.     Pharynx: Oropharynx is  clear.  Eyes:     Pupils: Pupils are equal, round, and reactive to light.  Cardiovascular:     Rate and Rhythm: Normal rate and regular rhythm.     Pulses: Normal pulses.     Heart sounds: Normal heart sounds. No murmur heard. Pulmonary:     Effort: Pulmonary effort is normal.     Breath sounds: Normal breath sounds.  Abdominal:     General: Abdomen is flat. Bowel sounds are normal.     Palpations: Abdomen is soft.  Musculoskeletal:        General: No swelling.     Cervical back: Neck supple.  Skin:    General: Skin is warm.  Neurological:     General: No focal deficit present.     Mental Status: She is alert and oriented to person, place, and time.  Psychiatric:        Mood and Affect: Mood normal.        Thought Content: Thought content normal.     Labs reviewed: Basic Metabolic Panel: Recent Labs    04/08/23 0438 04/08/23 0439 05/14/23 0948 02/15/24 0959 02/15/24 1026 03/20/24 1355 03/20/24 1408 03/21/24 0345  NA 140  --    < > 139   < > 137 141 136  K 3.6  --    < > 4.4   < > 5.1 4.3 4.4  CL 105  --    < > 103   < > 102 106 102  CO2 23  --    < > 28  --  25  --  23  GLUCOSE 89  --    < > 126*   < > 125* 108* 111*  BUN 24*  --    < > 24*   < > 28* 27* 27*  CREATININE 0.89  --    < > 1.15*   < > 1.11* 1.00 1.02*  CALCIUM  8.6*  --    < > 9.3  --  9.5  --  9.2  MG  --  2.0  --   --   --  2.1  --   --   TSH 1.862  --   --   --   --   --   --   1.070   < > = values in this interval not displayed.   Liver Function Tests: Recent Labs    05/14/23 0948 03/20/24 1355 03/21/24 0345  AST 29 65* 42*  ALT 25 46* 37  ALKPHOS 127* 147* 120  BILITOT 0.9 0.5 0.6  PROT 6.5 7.1 6.2*  ALBUMIN 3.8 4.6 4.0   No results for input(s): LIPASE, AMYLASE in the last 8760 hours. Recent Labs    03/21/24 0345  AMMONIA <13   CBC: Recent Labs    04/06/23 1326 05/14/23 0948 05/14/23 0954 02/15/24 0959 02/15/24 1026 03/20/24 1355 03/20/24 1408 03/21/24 0345  WBC 10.5 9.3   < > 9.6  --  12.7*  --  10.2  NEUTROABS 8.0* 7.2  --  6.9  --   --   --   --   HGB 11.9* 12.7   < > 13.4   < > 13.7 11.9* 12.3  HCT 37.9 39.8   < > 41.6   < > 42.6 35.0* 37.2  MCV 101.6* 97.8   < > 96.5  --  97.5  --  97.1  PLT 191 173   < > 188  --  202  --  180   < > =  values in this interval not displayed.   Lipid Panel: Recent Labs    04/07/23 0502  CHOL 132  HDL 66  LDLCALC 57  TRIG 43  CHOLHDL 2.0   Lab Results  Component Value Date   HGBA1C 5.5 03/06/2023    Procedures since last visit: EEG adult Result Date: 03/21/2024 Shelton Arlin KIDD, MD     03/21/2024  9:33 AM Patient Name: Theresa [name] MRN: [id number] Epilepsy Attending: Arlin KIDD Shelton Referring Physician/Provider: Seena Marsa NOVAK, MD Date: 03/21/2024 Duration: 22.27 mins Patient history: 89yo F with decline in mental status over the last 24 hours with new word finding difficulty and disorientation. EEG to evaluate for seizure Level of alertness: Awake, drowsy AEDs during EEG study: None Technical aspects: This EEG study was done with scalp electrodes positioned according to the 10-20 International system of electrode placement. Electrical activity was reviewed with band pass filter of 1-70Hz , sensitivity of 7 uV/mm, display speed of 64mm/sec with a 60Hz  notched filter applied as appropriate. EEG data were recorded continuously and digitally stored.  Video monitoring was available and  reviewed as appropriate. Description: The posterior dominant rhythm consists of 8 Hz activity of moderate voltage (25-35 uV) seen predominantly in posterior head regions, symmetric and reactive to eye opening and eye closing. Drowsiness was characterized by attenuation of the posterior background rhythm. EEG showed intermittent generalized 3-6hz  theta-delta slowing admixed with 15 to 18 Hz beta activity distributed symmetrically and diffusely. Hyperventilation and photic stimulation were not performed.   ABNORMALITY - Intermittent slow, generalized  IMPRESSION: This study is suggestive of mild diffuse encephalopathy. No seizures or epileptiform discharges were seen throughout the recording.  Arlin KIDD Shelton    MR BRAIN WO CONTRAST Result Date: 03/21/2024 EXAM: MRI BRAIN WITHOUT CONTRAST 03/21/2024 03:35:28 AM TECHNIQUE: Multiplanar multisequence MRI of the head/brain was performed without the administration of intravenous contrast. COMPARISON: CT of the head dated 03/20/2024 and MRI of the head dated 05/14/2023. CLINICAL HISTORY: Neuro deficit, acute, stroke suspected. FINDINGS: BRAIN AND VENTRICLES: There is no restricted diffusion to indicate acute or recent infarction. No intracranial hemorrhage. No mass. No midline shift. No hydrocephalus. The sella is unremarkable. Normal flow voids. There are focal encephalomalacia changes within the left frontal operculum. There are gliotic changes within the left frontal and parietal lobes. Chronic lacunar infarcts are again noted within the basal ganglia bilaterally, the left thalamus, and bilateral cerebellar hemispheres. There is moderate periventricular and deep cerebral white matter disease. ORBITS: The patient is status post bilateral lens replacement. SINUSES AND MASTOIDS: No acute abnormality. BONES AND SOFT TISSUES: Normal marrow signal. No acute soft tissue abnormality. IMPRESSION: 1. No evidence of acute or recent infarction. 2. Focal encephalomalacia within  the left frontal lobe with gliotic changes in the frontal and parietal lobes. 3. Chronic lacunar infarcts within the basal ganglia bilaterally, the left thalamus, and bilateral cerebellar hemispheres. 4. Moderate periventricular and deep cerebral white matter disease. Electronically signed by: Evalene Coho MD 03/21/2024 04:05 AM EST RP Workstation: [id number]   DG Abd 1 View Result Date: 03/21/2024 EXAM: 1 VIEW XRAY OF THE ABDOMEN 03/21/2024 01:39:00 AM COMPARISON: None available. CLINICAL HISTORY: Encounter for imaging to screen for metal prior to MRI. FINDINGS: BOWEL: Nonobstructive bowel gas pattern. SOFT TISSUES: Residual contrast material in the renal collecting system and bladder. Surgical clips in the right upper quadrant. Cardiac valve prosthesis. No radiopaque foreign bodies are demonstrated within the field of view, although residual contrast material could obscure foreign bodies in those areas.  BONES: Postoperative changes with rod and screw fixation of the lumbar spine. Right hip hemiarthroplasty. Degenerative changes in the lumbar spine. Scoliosis convex towards the right. IMPRESSION: 1. No radiopaque foreign bodies identified within the field of view, although residual contrast material could obscure foreign bodies. 2. Postsurgical metallic hardware including lumbar rod and screw fixation, right hip hemiarthroplasty, cardiac valve prosthesis, and right upper quadrant surgical clips. Electronically signed by: Elsie Gravely MD 03/21/2024 01:46 AM EST RP Workstation: [id number]   CT Angio Chest PE W and/or Wo Contrast Result Date: 03/20/2024 EXAM: CTA CHEST 03/20/2024 11:23:05 PM TECHNIQUE: CTA of the chest was performed after the administration of intravenous contrast. Multiplanar reformatted images are provided for review. MIP images are provided for review. Automated exposure control, iterative reconstruction, and/or weight based adjustment of the mA/kV was utilized to reduce the radiation  dose to as low as reasonably achievable. COMPARISON: None available. CLINICAL HISTORY: Pulmonary embolism (PE) suspected, high prob. FINDINGS: PULMONARY ARTERIES: Pulmonary arteries are adequately opacified for evaluation. No acute pulmonary embolus. Main pulmonary artery is normal in caliber. MEDIASTINUM: Cardiomegaly. Aortic atherosclerosis. The pericardium demonstrates no acute abnormality. There is no acute abnormality of the thoracic aorta. LYMPH NODES: No mediastinal, hilar or axillary lymphadenopathy. LUNGS AND PLEURA: Mosaic attenuation throughout the lungs compatible with air trapping. Peripheral ground glass opacities in the lower lobes, likely fibrosis. No acute confluent airspace opacities. No effusions. No pneumothorax. UPPER ABDOMEN: Limited images of the upper abdomen are unremarkable. SOFT TISSUES AND BONES: No acute bone or soft tissue abnormality. IMPRESSION: 1. No pulmonary embolism. 2. Mosaic attenuation throughout the lungs compatible with air trapping. 3. Peripheral ground glass opacities in the lower lobes, likely fibrosis. 4. Cardiomegaly and aortic atherosclerosis. Electronically signed by: Franky Crease MD 03/20/2024 11:28 PM EST RP Workstation: [id number]   CT CERVICAL SPINE WO CONTRAST Result Date: 03/20/2024 CLINICAL DATA:  Trauma EXAM: CT CERVICAL SPINE WITHOUT CONTRAST TECHNIQUE: Multidetector CT imaging of the cervical spine was performed without intravenous contrast. Multiplanar CT image reconstructions were also generated. RADIATION DOSE REDUCTION: This exam was performed according to the departmental dose-optimization program which includes automated exposure control, adjustment of the mA and/or kV according to patient size and/or use of iterative reconstruction technique. COMPARISON:  CT 03/15/2024, 02/15/2024 FINDINGS: Alignment: Reversal of cervical lordosis as before. Trace anterolisthesis C3 on C4 and C7 on T1. Similar fused anterolisthesis C4 on C5. Stable facet alignment  with chronic, fused slightly anterior positioning of the right C4 facet. Skull base and vertebrae: No acute fracture is seen. Interbody fusion C4-C5 and C5-C6 with facet ankylosis at C4-C5. Soft tissues and spinal canal: No prevertebral fluid or swelling. No visible canal hematoma. Disc levels: Interbody fusion C4 through C6. Advanced disc space narrowing and degenerative change C3-C4 and C6-C7. At least moderate spinal stenosis at C6-C7. Facet degenerative change at multiple levels with foraminal narrowing. Upper chest: Negative. Other: None IMPRESSION: 1. No acute osseous abnormality. 2. Similar reversal of cervical lordosis and degenerative changes as above. Suspected at least moderate spinal stenosis at C6-C7 Electronically Signed   By: Luke Bun M.D.   On: 03/20/2024 15:23   CT HEAD WO CONTRAST Result Date: 03/20/2024 CLINICAL DATA:  Head trauma EXAM: CT HEAD WITHOUT CONTRAST TECHNIQUE: Contiguous axial images were obtained from the base of the skull through the vertex without intravenous contrast. RADIATION DOSE REDUCTION: This exam was performed according to the departmental dose-optimization program which includes automated exposure control, adjustment of the mA and/or kV according to patient size and/or  use of iterative reconstruction technique. COMPARISON:  CT 03/15/2024, 02/15/2024 FINDINGS: Brain: No acute territorial infarction, hemorrhage or intracranial mass. Chronic left MCA infarct. Atrophy and chronic small vessel ischemic changes of the white matter. Small chronic cerebellar infarcts. Chronic lacunar infarcts in the basal ganglia. Stable ventricle size. Vascular: No hyperdense vessels.  Carotid vascular calcification Skull: No fracture Sinuses/Orbits: No acute finding. Other: Small right frontal scalp hematoma, decreased compared to prior. IMPRESSION: 1. No CT evidence for acute intracranial abnormality. 2. Atrophy and chronic small vessel ischemic changes of the white matter. Chronic left  MCA infarct and small chronic cerebellar and basal ganglial infarcts Electronically Signed   By: Luke Bun M.D.   On: 03/20/2024 15:11   DG Pelvis 1-2 Views Result Date: 03/20/2024 CLINICAL DATA:  [id number] Trauma [id number] EXAM: PELVIS - 1-2 VIEW COMPARISON:  March 03, 2023, February 15, 2024 FINDINGS: Osteopenia. No acute fracture or dislocation. Partial visualization of orthopedic hardware status post posterior fixation of the lower lumbar spine and RIGHT hip arthroplasty. Sacrum is obscured by overlapping bowel contents. IMPRESSION: 1. No acute fracture or dislocation. 2. If there is a persistent clinical concern for nondisplaced hip or pelvic fracture, recommend dedicated pelvic CT or MRI. Electronically Signed   By: Corean Salter M.D.   On: 03/20/2024 13:55   DG Chest 1 View Result Date: 03/20/2024 CLINICAL DATA:  [id number] Trauma [id number] EXAM: CHEST  1 VIEW COMPARISON:  February 15, 2024, March 03, 2023 FINDINGS: The cardiomediastinal silhouette is unchanged and enlarged in contour.Status post valve repair. Atherosclerotic calcifications of the tortuous aorta. No pleural effusion. No pneumothorax. No acute pleuroparenchymal abnormality. Remote LEFT-sided rib fractures. IMPRESSION: No acute cardiopulmonary abnormality. Electronically Signed   By: Corean Salter M.D.   On: 03/20/2024 13:53   CT Cervical Spine Wo Contrast Result Date: 03/15/2024 EXAM: CT CERVICAL SPINE WITHOUT CONTRAST 03/15/2024 11:16:00 AM TECHNIQUE: CT of the cervical spine was performed without the administration of intravenous contrast. Multiplanar reformatted images are provided for review. Automated exposure control, iterative reconstruction, and/or weight based adjustment of the mA/kV was utilized to reduce the radiation dose to as low as reasonably achievable. COMPARISON: 02/15/2024 CLINICAL HISTORY: Neck trauma (Age >= 65y) FINDINGS: BONES AND ALIGNMENT: Similar alignment of the cervical spine with slight reversal of the  cervical lordosis. There is similar degenerative anterolisthesis of C3 on C4, C4 on C5, and C7 on T1. There is additional similar trace degenerative retrolisthesis of C5 on C6. There is ankylosis of the vertebral bodies from the C4 to C6 levels, similar to prior. No acute fracture or traumatic malalignment. DEGENERATIVE CHANGES: Disc space narrowing at multiple levels. Disc osteophyte complex at C6-C7 resulting in moderate spinal canal stenosis, similar to prior. Facet arthrosis and uncovertebral hypertrophy at multiple levels. Foraminal stenosis at multiple levels throughout the cervical spine. SOFT TISSUES: No prevertebral soft tissue swelling. LUNGS: Biapical pleural scarring. IMPRESSION: 1. No evidence of acute traumatic injury. 2. Degenerative changes as above. Electronically signed by: Donnice Mania MD 03/15/2024 11:32 AM EST RP Workstation: [id number]   CT Head Wo Contrast Result Date: 03/15/2024 EXAM: CT HEAD WITHOUT CONTRAST 03/15/2024 11:16:00 AM TECHNIQUE: CT of the head was performed without the administration of intravenous contrast. Automated exposure control, iterative reconstruction, and/or weight based adjustment of the mA/kV was utilized to reduce the radiation dose to as low as reasonably achievable. COMPARISON: 02/26/2024 CLINICAL HISTORY: Head trauma, moderate-severe. FINDINGS: BRAIN AND VENTRICLES: No acute hemorrhage. No evidence of acute infarct. No hydrocephalus. No extra-axial collection. No mass  effect or midline shift. Similar remote left MCA territory infarct. Similar appearance of chronic microvascular ischemic changes and mild parenchymal volume loss. Chronic lacunar infarcts in the bilateral basal ganglia and cerebellum. Atherosclerosis at the skull base. ORBITS: Bilateral lens replacements. SINUSES: No acute abnormality. SOFT TISSUES AND SKULL: Right frontal scalp hematoma. No skull fracture. IMPRESSION: 1. No acute intracranial abnormality related to the head trauma. 2. Right  frontal scalp hematoma. 3. Similar remote left MCA territory infarct and chronic lacunar infarcts in the bilateral basal ganglia and cerebellum. Electronically signed by: Donnice Mania MD 03/15/2024 11:27 AM EST RP Workstation: [id number]    Assessment/Plan 1. Disorientation (Primary) Her workup in the hospital was negative.  She does have focal encephalomalacia  with chronic multiple lacunar infarcts in her MRI.  She has now moved to skilled level of care.   Her mental status seems at baseline. If patient worsen to consider goals of care  2. MCI (mild cognitive impairment) Will discuss with speech therapy Continue care in SNF  3. Falls frequently Will continue therapy.  Walking with her walker Consider stopping Eliquis  and goals of care if patient continues to call  4. History of TIA (transient ischemic attack) and stroke Will continue Eliquis  for now  5. Paroxysmal atrial fibrillation (HCC) She is on metoprolol  and Eliquis   6. Seizure disorder (HCC) On lacosamide   7. Hyperlipidemia, unspecified hyperlipidemia type Crestor  LDL 57 in 03/2023  8. Stage 3 chronic kidney disease,  Stable 9. Recurrent major depressive disorder, in full remission Lexapro   10. Hypothyroidism, unspecified type TSH normal in Hospital    Labs/tests ordered:  * No order type specified * Next appt:  Visit date not found     [1]  Allergies Allergen Reactions   Tape Other (See Comments)    Skin tears and bruises easily.   Vioxx [Rofecoxib] Other (See Comments)    Elevated LFT's   "

## 2024-04-03 ENCOUNTER — Encounter: Payer: Self-pay | Admitting: Internal Medicine

## 2024-04-08 ENCOUNTER — Other Ambulatory Visit: Payer: Self-pay | Admitting: Internal Medicine

## 2024-04-08 DIAGNOSIS — R569 Unspecified convulsions: Secondary | ICD-10-CM

## 2024-04-08 MED ORDER — LACOSAMIDE 100 MG PO TABS: 1.0000 | ORAL_TABLET | Freq: Two times a day (BID) | ORAL | 3 refills | Status: AC

## 2024-04-19 ENCOUNTER — Emergency Department (HOSPITAL_COMMUNITY)

## 2024-04-19 ENCOUNTER — Other Ambulatory Visit: Payer: Self-pay

## 2024-04-19 ENCOUNTER — Inpatient Hospital Stay (HOSPITAL_COMMUNITY): Admission: EM | Admit: 2024-04-19 | Source: Skilled Nursing Facility | Attending: Hospitalist | Admitting: Hospitalist

## 2024-04-19 ENCOUNTER — Encounter (HOSPITAL_COMMUNITY): Payer: Self-pay

## 2024-04-19 DIAGNOSIS — G9340 Encephalopathy, unspecified: Secondary | ICD-10-CM | POA: Diagnosis present

## 2024-04-19 DIAGNOSIS — G4089 Other seizures: Secondary | ICD-10-CM

## 2024-04-19 DIAGNOSIS — R4701 Aphasia: Principal | ICD-10-CM

## 2024-04-19 DIAGNOSIS — R0902 Hypoxemia: Secondary | ICD-10-CM

## 2024-04-19 DIAGNOSIS — R569 Unspecified convulsions: Secondary | ICD-10-CM

## 2024-04-19 LAB — DIFFERENTIAL
Abs Immature Granulocytes: 0.03 10*3/uL (ref 0.00–0.07)
Basophils Absolute: 0.1 10*3/uL (ref 0.0–0.1)
Basophils Relative: 1 %
Eosinophils Absolute: 0.1 10*3/uL (ref 0.0–0.5)
Eosinophils Relative: 1 %
Immature Granulocytes: 0 %
Lymphocytes Relative: 22 %
Lymphs Abs: 2.5 10*3/uL (ref 0.7–4.0)
Monocytes Absolute: 0.6 10*3/uL (ref 0.1–1.0)
Monocytes Relative: 6 %
Neutro Abs: 7.8 10*3/uL — ABNORMAL HIGH (ref 1.7–7.7)
Neutrophils Relative %: 70 %

## 2024-04-19 LAB — COMPREHENSIVE METABOLIC PANEL WITH GFR
ALT: 17 U/L (ref 0–44)
AST: 41 U/L (ref 15–41)
Albumin: 4.1 g/dL (ref 3.5–5.0)
Alkaline Phosphatase: 137 U/L — ABNORMAL HIGH (ref 38–126)
Anion gap: 12 (ref 5–15)
BUN: 18 mg/dL (ref 8–23)
CO2: 22 mmol/L (ref 22–32)
Calcium: 9.1 mg/dL (ref 8.9–10.3)
Chloride: 104 mmol/L (ref 98–111)
Creatinine, Ser: 1.14 mg/dL — ABNORMAL HIGH (ref 0.44–1.00)
GFR, Estimated: 46 mL/min — ABNORMAL LOW
Glucose, Bld: 159 mg/dL — ABNORMAL HIGH (ref 70–99)
Potassium: 5.1 mmol/L (ref 3.5–5.1)
Sodium: 138 mmol/L (ref 135–145)
Total Bilirubin: 0.5 mg/dL (ref 0.0–1.2)
Total Protein: 6.7 g/dL (ref 6.5–8.1)

## 2024-04-19 LAB — I-STAT CHEM 8, ED
BUN: 20 mg/dL (ref 8–23)
Calcium, Ion: 1.03 mmol/L — ABNORMAL LOW (ref 1.15–1.40)
Chloride: 104 mmol/L (ref 98–111)
Creatinine, Ser: 1.1 mg/dL — ABNORMAL HIGH (ref 0.44–1.00)
Glucose, Bld: 159 mg/dL — ABNORMAL HIGH (ref 70–99)
HCT: 39 % (ref 36.0–46.0)
Hemoglobin: 13.3 g/dL (ref 12.0–15.0)
Potassium: 4.6 mmol/L (ref 3.5–5.1)
Sodium: 140 mmol/L (ref 135–145)
TCO2: 26 mmol/L (ref 22–32)

## 2024-04-19 LAB — CBC
HCT: 41.3 % (ref 36.0–46.0)
Hemoglobin: 13 g/dL (ref 12.0–15.0)
MCH: 31.8 pg (ref 26.0–34.0)
MCHC: 31.5 g/dL (ref 30.0–36.0)
MCV: 101 fL — ABNORMAL HIGH (ref 80.0–100.0)
Platelets: 166 10*3/uL (ref 150–400)
RBC: 4.09 MIL/uL (ref 3.87–5.11)
RDW: 13.3 % (ref 11.5–15.5)
WBC: 11.2 10*3/uL — ABNORMAL HIGH (ref 4.0–10.5)
nRBC: 0 % (ref 0.0–0.2)

## 2024-04-19 LAB — ETHANOL: Alcohol, Ethyl (B): <15 mg/dL

## 2024-04-19 LAB — GLUCOSE, CAPILLARY: Glucose-Capillary: 107 mg/dL — ABNORMAL HIGH (ref 70–99)

## 2024-04-19 LAB — CBG MONITORING, ED
Glucose-Capillary: 129 mg/dL — ABNORMAL HIGH (ref 70–99)
Glucose-Capillary: 147 mg/dL — ABNORMAL HIGH (ref 70–99)

## 2024-04-19 MED ORDER — POLYETHYLENE GLYCOL 3350 17 G PO PACK: 17.0000 g | PACK | Freq: Every day | ORAL | Status: AC | PRN

## 2024-04-19 MED ORDER — SODIUM CHLORIDE 0.9 % IV SOLN: INTRAVENOUS | Status: AC

## 2024-04-19 MED ORDER — LEVOTHYROXINE SODIUM 75 MCG PO TABS
75.0000 ug | ORAL_TABLET | Freq: Every day | ORAL | Status: AC
  Administered 2024-04-20 – 2024-04-22 (×3): 75 ug via ORAL
  Filled 2024-04-19 (×3): qty 1

## 2024-04-19 MED ORDER — LORAZEPAM 2 MG/ML IJ SOLN
1.0000 mg | Freq: Once | INTRAMUSCULAR | Status: AC
  Administered 2024-04-19: 1 mg via INTRAVENOUS
  Filled 2024-04-19: qty 1

## 2024-04-19 MED ORDER — PROSIGHT PO TABS
1.0000 | ORAL_TABLET | Freq: Every day | ORAL | Status: AC
  Administered 2024-04-19 – 2024-04-22 (×4): 1 via ORAL
  Filled 2024-04-19 (×4): qty 1

## 2024-04-19 MED ORDER — SODIUM CHLORIDE 0.9 % IV SOLN
100.0000 mg | Freq: Once | INTRAVENOUS | Status: AC
  Administered 2024-04-19: 100 mg via INTRAVENOUS
  Filled 2024-04-19: qty 10

## 2024-04-19 MED ORDER — ESCITALOPRAM OXALATE 10 MG PO TABS
15.0000 mg | ORAL_TABLET | Freq: Every day | ORAL | Status: AC
  Administered 2024-04-19 – 2024-04-22 (×4): 15 mg via ORAL
  Filled 2024-04-19 (×5): qty 2

## 2024-04-19 MED ORDER — DOCUSATE SODIUM 100 MG PO CAPS
100.0000 mg | ORAL_CAPSULE | Freq: Two times a day (BID) | ORAL | Status: AC
  Administered 2024-04-19 – 2024-04-22 (×6): 100 mg via ORAL
  Filled 2024-04-19 (×8): qty 1

## 2024-04-19 MED ORDER — LACOSAMIDE 50 MG PO TABS
100.0000 mg | ORAL_TABLET | Freq: Two times a day (BID) | ORAL | Status: DC
  Filled 2024-04-19: qty 2

## 2024-04-19 MED ORDER — OCUVITE-LUTEIN PO CAPS: 1.0000 | ORAL_CAPSULE | Freq: Every day | ORAL | Status: DC

## 2024-04-19 MED ORDER — FLUTICASONE PROPIONATE 50 MCG/ACT NA SUSP
2.0000 | Freq: Every day | NASAL | Status: AC
  Administered 2024-04-19 – 2024-04-22 (×3): 2 via NASAL
  Filled 2024-04-19: qty 16

## 2024-04-19 MED ORDER — SODIUM CHLORIDE 0.9% FLUSH
3.0000 mL | Freq: Once | INTRAVENOUS | Status: AC
  Administered 2024-04-19: 3 mL via INTRAVENOUS

## 2024-04-19 MED ORDER — LACOSAMIDE 200 MG PO TABS
200.0000 mg | ORAL_TABLET | Freq: Two times a day (BID) | ORAL | Status: AC
  Administered 2024-04-19 – 2024-04-22 (×7): 200 mg via ORAL
  Filled 2024-04-19 (×7): qty 1

## 2024-04-19 MED ORDER — IOHEXOL 350 MG/ML SOLN
75.0000 mL | Freq: Once | INTRAVENOUS | Status: AC | PRN
  Administered 2024-04-19: 75 mL via INTRAVENOUS

## 2024-04-19 MED ORDER — METOPROLOL TARTRATE 25 MG PO TABS
25.0000 mg | ORAL_TABLET | Freq: Two times a day (BID) | ORAL | Status: DC
  Administered 2024-04-19 – 2024-04-20 (×4): 25 mg via ORAL
  Filled 2024-04-19 (×5): qty 1

## 2024-04-19 MED ORDER — APIXABAN 2.5 MG PO TABS
2.5000 mg | ORAL_TABLET | Freq: Two times a day (BID) | ORAL | Status: AC
  Administered 2024-04-20 – 2024-04-22 (×6): 2.5 mg via ORAL
  Filled 2024-04-19 (×6): qty 1

## 2024-04-19 MED ORDER — PRESERVISION AREDS 2 PO CHEW: CHEWABLE_TABLET | Freq: Two times a day (BID) | ORAL | Status: DC

## 2024-04-19 MED ORDER — ROSUVASTATIN CALCIUM 5 MG PO TABS
10.0000 mg | ORAL_TABLET | Freq: Every day | ORAL | Status: AC
  Administered 2024-04-19 – 2024-04-22 (×4): 10 mg via ORAL
  Filled 2024-04-19 (×4): qty 2

## 2024-04-19 NOTE — Plan of Care (Signed)

## 2024-04-19 NOTE — ED Notes (Signed)
 Pt turned and repositioned, while placing brief pt went unresponsive, O2 sat 55 w/ good pleth, lips cyanotic. Ambu bag placed, pt w/ good pulses. MD X 2 to bedside. O2 sat increased to 95%, pt more responsive. Ambu bag removed, O2 sat decreased to 80%, NRB placed at 15L

## 2024-04-19 NOTE — ED Notes (Signed)
 Spoke with Admitting MD and wants to do EEG overnight before MRI so floor notified patient will be coming up.

## 2024-04-19 NOTE — Progress Notes (Cosign Needed Addendum)
 STAT LTM EEG hooked up and running - no initial skin breakdown - push button tested - [facility] not monitoring at this time pt is in the ED MRI safe leads used.

## 2024-04-19 NOTE — H&P (Signed)
 " History and Physical    Patient: Theresa [name] FMW:[id number] DOB: 05/03/1931 DOA: 04/19/2024 DOS: the patient was seen and examined on 04/19/2024 PCP: Charlanne Fredia CROME, MD  Patient coming from: Home  Chief Complaint:  Chief Complaint  Patient presents with   Code Stroke   HPI: Theresa [name] is a 89 y.o. female with medical history significant of A-fib on Eliquis , prior CVA, seizures on VImpat , hypothyroidism, HLD, HTN, CKD, depression, GERD who p/w acute encephalopathy after unresponsiveness and initially thought to be code stroke, but now more c/w seizure.  Pt is sleeping, awakens to voice, but unable to recall to events that lead to her admission. Per facility member at bedside, the patient experienced an episode of unresponsiveness following breakfast. The patient's husband noticed that the patient seemed slumped over and drooling while they were watching TV. Attempts to rouse the patient were unsuccessful. The duration of the unresponsiveness was 30 seconds per Neurology note. This event was noted to be different from previous visits to the ER in the last month or two, as pt did not fall this admission, but there is concern that the visits may be connected. Per report, the patient eventually returned to her baseline and was was oriented x3. Of note, while transferring from ED 16 to ED 39 pt again became symptomatically unresponsive with EEG in place, and EDP called Neurologist to eval.  In the ED, pt tachycardic, and hypertensive. Labs unremarkable. CXR showed no acute process. CTA head neck neg for LVO. EEG pending. EDP consulted Neurology who requested medicine admission.   Review of Systems: As mentioned in the history of present illness. All other systems reviewed and are negative. Past Medical History:  Diagnosis Date   Atrial fibrillation (HCC)    AFIB   Collagenous colitis    CVA (cerebral vascular accident) (HCC)    a.  L parietal CVA by MRI 12/12, likely embolic;  b.   TEE by report with neg bubble study;  c.  carotid dopplers  12/12:  + plaque, no sig ICA stenosis    History of postmenopausal HRT    Hx of adenomatous colonic polyps    Hx of ischemic left MCA stroke 04/08/2011   Long term numbness in right hand in 1st 2 fingers, as well as slowed speech  Atorvastatin , Xarelto  20mg . -? If stroke from a fib      Hypothyroidism    MVP (mitral valve prolapse)    a. s/p MV repair at [facility] in 2008;  b. Echocardiogram 7/12: EF 50%, status post mitral valve repair with mild MR and minimal MS, mean gradient 4, moderate LAE, LA diam 55 mm; mild RAE, PASP 28-32;    Osteoarthritis    Osteoporosis    Past Surgical History:  Procedure Laterality Date   ANTERIOR APPROACH HEMI HIP ARTHROPLASTY Right 03/05/2023   Procedure: ANTERIOR APPROACH HEMI HIP ARTHROPLASTY;  Surgeon: Fidel Rogue, MD;  Location: [facility];  Service: Orthopedics;  Laterality: Right;   CARDIOVERSION  01/09/2012   Procedure: CARDIOVERSION;  Surgeon: Toribio JONELLE Fuel, MD;  Location: [facility] ENDOSCOPY;  Service: Cardiovascular;  Laterality: N/A;   CATARACT EXTRACTION     CHOLECYSTECTOMY     DILATION AND CURETTAGE OF UTERUS     LUMBAR FUSION     MITRAL VALVE REPAIR  03/17/2006   mitral valve repair CE ring and maze procedure   TONSILLECTOMY     Social History:  reports that she quit smoking about 58 years ago. Her smoking use  included cigarettes. She has never used smokeless tobacco. She reports current alcohol use of about 7.0 standard drinks of alcohol per week. She reports that she does not use drugs.  Allergies[1]  Family History  Problem Relation Age of Onset   Cancer Father        COLON   Stroke Neg Hx        1ST DEGREE RELATIVE <60    Prior to Admission medications  Medication Sig Start Date End Date Taking? Authorizing Provider  acetaminophen  (TYLENOL ) 650 MG CR tablet Take 650 mg by mouth daily.   Yes [provider]  amoxicillin  (AMOXIL ) 500 MG tablet Take 2,000 mg by mouth  once as needed (1 hour prior to dental work).   Yes [provider]  apixaban  (ELIQUIS ) 2.5 MG TABS tablet Take 1 tablet (2.5 mg total) by mouth 2 (two) times daily. 04/08/23  Yes Tobie Yetta HERO, MD  Calcium  Citrate-Vitamin D  (CITRACAL PETITES/VITAMIN D ) 200-6.25 MG-MCG TABS Take 1 tablet by mouth 2 (two) times daily.   Yes [provider]  docusate sodium  (COLACE) 100 MG capsule Take 1 capsule (100 mg total) by mouth 2 (two) times daily. 03/09/23  Yes Rai, Ripudeep K, MD  ESCITALOPRAM  OXALATE PO Take 15 mg by mouth daily.   Yes [provider]  fluticasone  (FLONASE ) 50 MCG/ACT nasal spray Place 2 sprays into both nostrils daily. 02/11/22  Yes Jesus Bernardino MATSU, MD  GLUCOSAMINE-CHONDROITIN PO Take 1 tablet by mouth 2 (two) times daily. 03/26/24  Yes [provider]  Lacosamide  100 MG TABS Take 1 tablet (100 mg total) by mouth in the morning and at bedtime. 04/08/24  Yes Charlanne Fredia CROME, MD  levothyroxine  (SYNTHROID ) 75 MCG tablet TAKE 1 TABLET BY MOUTH EVERY DAY BEFORE BREAKFAST 12/12/22  Yes Katrinka Garnette KIDD, MD  loratadine  (CLARITIN ) 10 MG tablet Take 1 tablet (10 mg total) by mouth daily. 02/11/22  Yes Jesus Bernardino MATSU, MD  metoprolol  tartrate (LOPRESSOR ) 25 MG tablet Take 1 tablet (25 mg total) by mouth 2 (two) times daily. 03/09/23  Yes Rai, Ripudeep K, MD  Multiple Vitamins-Minerals (PRESERVISION AREDS 2 PO) Take 1 capsule by mouth 2 (two) times daily. 03/26/24  Yes [provider]  polyethylene glycol (MIRALAX  / GLYCOLAX ) 17 g packet Take 17 g by mouth daily as needed for mild constipation. 03/09/23  Yes Rai, Ripudeep K, MD  rosuvastatin  (CRESTOR ) 10 MG tablet Take 10 mg by mouth daily.   Yes [provider]  Saline (SIMPLY SALINE) 0.9 % AERS Place 2 each into the nose daily as needed. 02/11/22  Yes Jesus Bernardino MATSU, MD  Zinc  Oxide 20 % PSTE Apply 1 application  topically See admin instructions. Apply topically to peri/buttocks every 24 hours as  needed for skin protection. Apply after each incontinent episode for skin protection. 03/25/24  Yes [provider]    Physical Exam: Vitals:   04/19/24 1115  BP: (!) 160/92  Pulse: (!) 120  Resp: 17  Temp: 98.5 F (36.9 C)  TempSrc: Oral  SpO2: 95%   General: Alert, oriented x3, resting comfortably in no acute distress Respiratory: Lungs clear to auscultation bilaterally with normal respiratory effort; no w/r/r Cardiovascular: Regular rate and rhythm w/o m/r/g Abdomen: Soft, nontender, nondistended. Positive bowel sounds   Data Reviewed:  Lab Results  Component Value Date   WBC 11.2 (H) 04/19/2024   HGB 13.3 04/19/2024   HCT 39.0 04/19/2024   MCV 101.0 (H) 04/19/2024   PLT 166 04/19/2024  Lab Results  Component Value Date   GLUCOSE 159 (H) 04/19/2024   CALCIUM  9.1 04/19/2024   NA 140 04/19/2024   K 4.6 04/19/2024   CO2 22 04/19/2024   CL 104 04/19/2024   BUN 20 04/19/2024   CREATININE 1.10 (H) 04/19/2024   Lab Results  Component Value Date   ALT 17 04/19/2024   AST 41 04/19/2024   ALKPHOS 137 (H) 04/19/2024   BILITOT 0.5 04/19/2024   Lab Results  Component Value Date   INR 1.1 05/14/2023   INR 1.1 04/06/2023   INR 1.1 03/03/2023   Radiology: DG Chest Portable 1 View Result Date: 04/19/2024 CLINICAL DATA:  Altered mental status.  Stroke. EXAM: PORTABLE CHEST 1 VIEW COMPARISON:  Radiographs 03/20/2024 and 02/15/2024.  CT 02/15/2024. FINDINGS: 1304 hours. The heart size and mediastinal contours are stable with cardiomegaly and aortic atherosclerosis post mitral valve repair. There is interval improved aeration of the lung bases. No edema, confluent airspace disease, pneumothorax or significant pleural effusion. Old rib fractures are present on the left. Retained epicardial pacing leads, thoracolumbar scoliosis and postsurgical changes in the lumbar spine noted. IMPRESSION: Interval improved aeration of the lung bases. No acute cardiopulmonary process.  Electronically Signed   By: Elsie Perone M.D.   On: 04/19/2024 13:30   CT ANGIO HEAD NECK W WO CM (CODE STROKE) Result Date: 04/19/2024 EXAM: CTA HEAD AND NECK WITHOUT AND WITH 04/19/2024 11:09:15 AM TECHNIQUE: CTA of the head and neck was performed without and with the administration of 75 mL of intravenous iohexol  (OMNIPAQUE ) 350 MG/ML injection. Multiplanar 2D and/or 3D reformatted images are provided for review. Automated exposure control, iterative reconstruction, and/or weight based adjustment of the mA/kV was utilized to reduce the radiation dose to as low as reasonably achievable. Stenosis of the internal carotid arteries measured using NASCET criteria. COMPARISON: CT of the head dated 04/19/2024. CLINICAL HISTORY: Neuro deficit, acute, stroke suspected. Acute neurological deficit; stroke suspected. FINDINGS: CTA NECK: AORTIC ARCH AND ARCH VESSELS: No dissection or arterial injury. No significant stenosis of the brachiocephalic or subclavian arteries. CERVICAL CAROTID ARTERIES: Minimal calcific plaque within the carotid bulbs with no appreciable stenosis. The cervical segments of the internal carotid arteries are moderately tortuous bilaterally, but normal in caliber. No dissection or arterial injury. CERVICAL VERTEBRAL ARTERIES: The right vertebral artery is dominant and the left vertebral artery is hypoplastic. No dissection, arterial injury, or significant stenosis. LUNGS AND MEDIASTINUM: Unremarkable. SOFT TISSUES: No acute abnormality. BONES: Grade 1 anterolisthesis at C3-C4. Reversal of the normal cervical lordosis. Fusion of the C4-C5 and C5-C6 disc spaces. Moderate chronic degenerative disc disease at C6-C7. Grade 1 degenerative anterolisthesis at C7-T1. CTA HEAD: ANTERIOR CIRCULATION: Mild calcific atheromatous disease within the carotid siphons with no associated stenosis. No significant stenosis of the internal carotid arteries. No significant stenosis of the anterior cerebral arteries. No  significant stenosis of the middle cerebral arteries. No aneurysm. POSTERIOR CIRCULATION: No significant stenosis of the posterior cerebral arteries. No significant stenosis of the basilar artery. No significant stenosis of the vertebral arteries. No aneurysm. OTHER: No dural venous sinus thrombosis on this non-dedicated study. The above findings were communicated to Dr. Michaela at 11:19 AM 04/19/2024. IMPRESSION: 1. No large vessel occlusion, hemodynamically significant stenosis, or aneurysm in the head or neck. 2. Mild calcific atheromatous disease within the carotid siphons without associated stenosis. 3. Minimal calcific plaque within the carotid bulbs without appreciable stenosis. 4. Moderately tortuous cervical segments of the internal carotid arteries bilaterally, normal in caliber.  5. Dominant right vertebral artery and hypoplastic left vertebral artery. 6. Findings communicated to Dr. Michaela at 11:19 AM on 04/19/24. Electronically signed by: Evalene Coho MD 04/19/2024 11:21 AM EST RP Workstation: [id number]   CT HEAD CODE STROKE WO CONTRAST Result Date: 04/19/2024 EXAM: CT HEAD WITHOUT CONTRAST 04/19/2024 11:02:18 AM TECHNIQUE: CT of the head was performed without the administration of intravenous contrast. Automated exposure control, iterative reconstruction, and/or weight based adjustment of the mA/kV was utilized to reduce the radiation dose to as low as reasonably achievable. COMPARISON: CT of the head dated 03/20/2024. CLINICAL HISTORY: Neuro deficit, acute, stroke suspected. Acute neurologic deficit; stroke suspected. FINDINGS: BRAIN AND VENTRICLES: Age-related atrophy and mild-to-moderate cerebral white matter disease. Chronic encephalomalacia changes are again demonstrated at the left frontoparietal junction from remote MCA distribution infarct. No acute hemorrhage. No evidence of acute infarct. No hydrocephalus. No extra-axial collection. No mass effect or midline shift. ORBITS: The  patient is status post bilateral lens replacement. SINUSES: Mild mucoperiosteal thickening present within the right maxillary sinus. SOFT TISSUES AND SKULL: No acute soft tissue abnormality. No skull fracture. [place] Stroke Program Early CT Score (ASPECTS) ----- Ganglionic (caudate, IC, lentiform nucleus, insula, M1-M3): 7 Supraganglionic (M4-M6): 3 Total: 10 IMPRESSION: 1. No acute intracranial abnormality in the setting of acute neurologic deficit; no acute hemorrhage, mass effect, or acute cortical infarct. 2. Age-related atrophy and mild-to-moderate chronic cerebral white matter disease. 3. Chronic encephalomalacia at the left frontoparietal junction from remote MCA distribution infarct. 4. Findings communicated to Dr. Michaela at 11:06 AM on 04/19/24. 5. ASPECTS: 10. Electronically signed by: Evalene Coho MD 04/19/2024 11:08 AM EST RP Workstation: [id number]    Assessment and Plan: 69F h/o A-fib on Eliquis , prior CVA, seizures on VImpat , hypothyroidism, HLD, HTN, CKD, depression, GERD who p/w acute encephalopathy after unresponsiveness and initially thought to be code stroke, but now more c/w seizure.  Acute encephalopathy Seizure -Neurology following; apprec eval/recs -PTA locasamide 100mg  BID for now -Anticipate pt would benefit from AED load, but will defer to Neurology for now -F/u MRI to exclude stroke  Afib -PTA Eliquis  5mg  BID to resume tomorrow -PTA metoprolol  tartrate 25mg  BID  HLD -PTA Crestor  10mg  daily  Hypothyroid -PTA Synthroid  75mcg daily to resume tomorrow  Depression -PTA Lexapro  15mg  daily   Advance Care Planning:   Code Status: Prior   Consults: Neurology  Family Communication: Rosaline Sharps [phone]  Severity of Illness: The appropriate patient status for this patient is INPATIENT. Inpatient status is judged to be reasonable and necessary in order to provide the required intensity of service to ensure the patient's safety. The patient's presenting  symptoms, physical exam findings, and initial radiographic and laboratory data in the context of their chronic comorbidities is felt to place them at high risk for further clinical deterioration. Furthermore, it is not anticipated that the patient will be medically stable for discharge from the hospital within 2 midnights of admission.   * I certify that at the point of admission it is my clinical judgment that the patient will require inpatient hospital care spanning beyond 2 midnights from the point of admission due to high intensity of service, high risk for further deterioration and high frequency of surveillance required.*   ------- I spent 57 minutes reviewing previous notes, at the bedside counseling/discussing the treatment plan, and performing clinical documentation.  Author: Marsha Ada, MD 04/19/2024 1:52 PM  For on call review [email or web address].      [1]  Allergies Allergen Reactions   Tape Other (  See Comments)    Skin tears and bruises easily.   Vioxx [Rofecoxib] Other (See Comments)    Elevated LFT's   "

## 2024-04-20 ENCOUNTER — Inpatient Hospital Stay (HOSPITAL_COMMUNITY)

## 2024-04-20 DIAGNOSIS — G4089 Other seizures: Secondary | ICD-10-CM | POA: Diagnosis not present

## 2024-04-20 DIAGNOSIS — R569 Unspecified convulsions: Secondary | ICD-10-CM | POA: Diagnosis not present

## 2024-04-20 LAB — CBC
HCT: 35.1 % — ABNORMAL LOW (ref 36.0–46.0)
Hemoglobin: 11.8 g/dL — ABNORMAL LOW (ref 12.0–15.0)
MCH: 31.8 pg (ref 26.0–34.0)
MCHC: 33.6 g/dL (ref 30.0–36.0)
MCV: 94.6 fL (ref 80.0–100.0)
Platelets: 147 10*3/uL — ABNORMAL LOW (ref 150–400)
RBC: 3.71 MIL/uL — ABNORMAL LOW (ref 3.87–5.11)
RDW: 13.2 % (ref 11.5–15.5)
WBC: 9 10*3/uL (ref 4.0–10.5)
nRBC: 0 % (ref 0.0–0.2)

## 2024-04-20 LAB — BASIC METABOLIC PANEL WITH GFR
Anion gap: 9 (ref 5–15)
BUN: 17 mg/dL (ref 8–23)
CO2: 26 mmol/L (ref 22–32)
Calcium: 8.6 mg/dL — ABNORMAL LOW (ref 8.9–10.3)
Chloride: 106 mmol/L (ref 98–111)
Creatinine, Ser: 0.79 mg/dL (ref 0.44–1.00)
GFR, Estimated: >60 mL/min
Glucose, Bld: 95 mg/dL (ref 70–99)
Potassium: 3.6 mmol/L (ref 3.5–5.1)
Sodium: 141 mmol/L (ref 135–145)

## 2024-04-20 LAB — PROTIME-INR
INR: 1.1 (ref 0.8–1.2)
Prothrombin Time: 14.3 s (ref 11.4–15.2)

## 2024-04-20 LAB — APTT: aPTT: 31 s (ref 24–36)

## 2024-04-20 MED ORDER — LORAZEPAM 2 MG/ML IJ SOLN: 1.0000 mg | Freq: Once | INTRAMUSCULAR | Status: DC | PRN

## 2024-04-20 MED ORDER — GADOBUTROL 1 MMOL/ML IV SOLN
5.0000 mL | Freq: Once | INTRAVENOUS | Status: AC | PRN
  Administered 2024-04-20: 5 mL via INTRAVENOUS

## 2024-04-20 NOTE — Progress Notes (Signed)
 Spoke with radiology about MRI.  EEG still ongoing.  Leads are compatible but box is not per MRI tech.  Pt also very restless at this time, unable to remain still.  Will hold off for now and update team.

## 2024-04-20 NOTE — Progress Notes (Signed)
 NEUROLOGY CONSULT FOLLOW UP NOTE   Date of service: April 20, 2024 Patient Name: Theresa [name] MRN:  [id number] DOB:  [birth year]  Interval Hx/subjective   LTM read notes 2 seizures yesterday, 1425 and 1516 lasting 45 seconds and 1 minute 45 seconds each. No further seizures noted after Vimpat  load and Vimpat  maintenance dose given.  She has improved alertness and mental status today, but still has some mild aphasia and confusion.   Vitals   Vitals:   04/19/24 1629 04/19/24 2036 04/20/24 0014 04/20/24 0302  BP: (!) 156/88 (!) 176/84 (!) 168/90 137/62  Pulse: (!) 109 (!) 110 87 70  Resp: 19  15 19   Temp: 97.9 F (36.6 C) (!) 97.5 F (36.4 C) 98 F (36.7 C) 98 F (36.7 C)  TempSrc: Oral Axillary Axillary Oral  SpO2: 100%   100%  Weight: 57.3 kg     Height: 5' (1.524 m)        Body mass index is 24.67 kg/m.  Physical Exam   Constitutional: Appears well-developed and well-nourished.  Cardiovascular: Normal rate and regular rhythm.  Respiratory: Effort normal, non-labored breathing.   Neurologic Examination   Neuro: Mental Status: Patient is awake, alert, oriented to person, age, birthday. Confused to place, current month.  When asked where she is, she says the [place] [facility], I'm where the fleeta took me (she did come in via ambulance). I asked her if she was in the hospital and she said no. I then told her she was and asked her if she remembers coming in the ambulance and she said yes, and added that it was yesterday. No signs of aphasia or neglect Cranial Nerves: II: Visual Fields are full. Pupils are equal, round, and reactive to light.   III,IV, VI: EOMI without ptosis or diploplia.  V: Facial sensation is symmetric to light touch VII: Facial movement is symmetric.  VIII: hearing is intact to voice X: Uvula elevates symmetrically XI: Shoulder shrug is symmetric. XII: tongue is midline without atrophy or fasciculations.  Motor: Tone is normal. Bulk is normal.   Generalized weakness, no drifts.  Sensory: Sensation is symmetric to light touch in the arms and legs. Cerebellar: FNF slow but intact. No ataxia.    Medications Current Medications[1]  Labs and Diagnostic Imaging   CBC:  Recent Labs  Lab 04/19/24 1058 04/19/24 1103 04/20/24 0802  WBC 11.2*  --  9.0  NEUTROABS 7.8*  --   --   HGB 13.0 13.3 11.8*  HCT 41.3 39.0 35.1*  MCV 101.0*  --  94.6  PLT 166  --  147*    Basic Metabolic Panel:  Lab Results  Component Value Date   NA 141 04/20/2024   K 3.6 04/20/2024   CO2 26 04/20/2024   GLUCOSE 95 04/20/2024   BUN 17 04/20/2024   CREATININE 0.79 04/20/2024   CALCIUM  8.6 (L) 04/20/2024   GFRNONAA >60 04/20/2024   GFRAA 76 (L) 03/21/2013   Lipid Panel:  Lab Results  Component Value Date   LDLCALC 57 04/07/2023   HgbA1c:  Lab Results  Component Value Date   HGBA1C 5.5 03/06/2023   Urine Drug Screen:     Component Value Date/Time   LABOPIA NONE DETECTED 03/21/2013 1242   COCAINSCRNUR NONE DETECTED 03/21/2013 1242   LABBENZ NONE DETECTED 03/21/2013 1242   AMPHETMU NONE DETECTED 03/21/2013 1242   THCU NONE DETECTED 03/21/2013 1242   LABBARB NONE DETECTED 03/21/2013 1242    Alcohol Level  Component Value Date/Time   [facility] <15 04/19/2024 1110   INR  Lab Results  Component Value Date   INR 1.1 04/20/2024   APTT  Lab Results  Component Value Date   APTT 31 04/20/2024   AED levels: No results found for: PHENYTOIN, ZONISAMIDE, LAMOTRIGINE, LEVETIRACETA  CT Head without contrast(Personally reviewed): No acute intracranial abnormality in the setting of acute neurologic deficit; no acute hemorrhage, mass effect, or acute cortical infarct. Age-related atrophy and mild-to-moderate chronic cerebral white matter disease. Chronic encephalomalacia at the left frontoparietal junction from remote MCA distribution infarct  CT angio Head and Neck with contrast(Personally reviewed): No LVO   MRI  Brain(Personally reviewed): Pending.   rEEG: 04/19/24 1221 to 04/20/24 0845 ABNORMALITY - Focal seizure, left frontotemporal region - Focal seizure, right frontotemporal region - Continuous low, generalized IMPRESSION: This study showed two seizures arising from left frontotemporal region on 04/19/2024 at 1344 and 1516 as described above, lasting about 1 minute 30 seconds and 1 minute 45 seconds respectively. Another seizure arising from right frontotemporal region was also noted on 04/19/2024 at 1425 as described above, lasting for about 45 seconds. Additional EEG was suggestive of generalized cerebral dysfunction (encephalopathy)  Assessment   Theresa [name] is a 89 y.o. female  hx of A fib on Eliquis , prior CVA, seizures on VImpat , hypothyroidism, HLD, HTN, CKD, depression, GERD who presents from independent facility, witnessed by husband She lifted her arm and then started rubbing her fingers together and was unresponsive for 30-60seconds and then was noted to be altered. CT head with no acute process. CTA head and neck with no LVO. Breakthrough seizure was suspected and she was connected to LTM. Read this AM notes 2 seziures, but none since Vimpat  load and maintenance dose given. MRI brain completed today is negative. CXR negative, UA not completed yet, Labs unremarkable.   On exam this morning, she seems to be improved in her mental status but per chart review and facility staff, not quite back to her baseline. EEG showed the known seizures, but no activity since she received the Vimpat . Continuing the LTM is warranted to see if the Vimpat  continues to be effective for her.   Recommendations   - Seizure precautions - Delirium precautions - UA - Continue LTM - Continue Vimpat  ______________________________________________________________________  Bonney Rocky JAYSON Judithe, NP [facility]   She is improved compared to yesterday, she had no further seizures following lacosamide   load yesterday and she is now on an increased dose.  We will continue to monitor overnight again given that she also got Ativan  I want to make sure that she will not start seizing again once it wears off.  As long as she does not have any further seizures overnight and continues to improve, likely could discontinue tomorrow  Aisha Seals, MD [facility]   If 7pm- 7am, please page neurology on call as listed in AMION.     [1]  Current Facility-Administered Medications:    0.9 %  sodium chloride  infusion, , Intravenous, Continuous, Georgina Basket, MD, Stopped at 04/20/24 [phone]   apixaban  (ELIQUIS ) tablet 2.5 mg, 2.5 mg, Oral, BID, Georgina Basket, MD   docusate sodium  (COLACE) capsule 100 mg, 100 mg, Oral, BID, Moore, Willie, MD, 100 mg at 04/19/24 1718   escitalopram  (LEXAPRO ) tablet 15 mg, 15 mg, Oral, Daily, Georgina Basket, MD, 15 mg at 04/19/24 1719   fluticasone  (FLONASE ) 50 MCG/ACT nasal spray 2 spray, 2 spray, Each Nare, Daily, Georgina Basket, MD, 2 spray at  04/19/24 1723   lacosamide  (VIMPAT ) tablet 200 mg, 200 mg, Oral, BID, Michaela Aisha SQUIBB, MD, 200 mg at 04/19/24 2142   levothyroxine  (SYNTHROID ) tablet 75 mcg, 75 mcg, Oral, Q0600, Georgina Basket, MD   LORazepam  (ATIVAN ) injection 1 mg, 1 mg, Intravenous, Once PRN, Opyd, Timothy S, MD   metoprolol  tartrate (LOPRESSOR ) tablet 25 mg, 25 mg, Oral, BID, Georgina Basket, MD, 25 mg at 04/19/24 2142   multivitamin (PROSIGHT) tablet 1 tablet, 1 tablet, Oral, Daily, Georgina Basket, MD, 1 tablet at 04/19/24 1718   polyethylene glycol (MIRALAX  / GLYCOLAX ) packet 17 g, 17 g, Oral, Daily PRN, Georgina Basket, MD   rosuvastatin  (CRESTOR ) tablet 10 mg, 10 mg, Oral, QHS, Moore, Willie, MD, 10 mg at 04/19/24 2142

## 2024-04-20 NOTE — TOC Initial Note (Signed)
 Transition of Care [facility]) - Initial/Assessment Note    Patient Details  Name: Theresa [name] MRN: [id number] Date of Birth: Mar 05, 1932  Transition of Care [facility]) CM/SW Contact:    Almarie CHRISTELLA Goodie, LCSW Phone Number: 04/20/2024, 3:04 PM  Clinical Narrative:       Patient is a SNF resident at [facility], recently moved into LTC SNF after a recent hospital stay. Patient's spouse is also a resident in memory care. Patient has a POA, Rosaline, through [employer]. CSW met with Rosaline at bedside to discuss patient's plan to return to SNF when ready. CSW to follow.            Expected Discharge Plan: Skilled Nursing Facility Barriers to Discharge: Continued Medical Work up   Patient Goals and CMS Choice Patient states their goals for this hospitalization and ongoing recovery are:: patient unable to participate in goal setting, not fully oriented CMS Medicare.gov Compare Post Acute Care list provided to:: Patient Represenative (must comment) Choice offered to / list presented to : [facility] POA / Guardian [place] ownership interest in [facility].provided to:: [facility] POA / Guardian    Expected Discharge Plan and Services     Post Acute Care Choice: Skilled Nursing Facility Living arrangements for the past 2 months: Skilled Nursing Facility                                      Prior Living Arrangements/Services Living arrangements for the past 2 months: Skilled Nursing Facility Lives with:: Facility Resident Patient language and need for interpreter reviewed:: No Do you feel safe going back to the place where you live?: Yes      Need for Family Participation in Patient Care: Yes (Comment) Care giver support system in place?: Yes (comment)   Criminal Activity/Legal Involvement Pertinent to Current Situation/Hospitalization: No - Comment as needed  Activities of Daily Living      Permission Sought/Granted Permission sought to share information  with : Facility Medical Sales Representative, Family Supports Permission granted to share information with : Yes, Verbal Permission Granted  Share Information with NAME: Rosaline  Permission granted to share info w AGENCY: [facility]  Permission granted to share info w Relationship: POA     Emotional Assessment Appearance:: Appears stated age Attitude/Demeanor/Rapport: Unable to Assess Affect (typically observed): Unable to Assess Orientation: : Oriented to Self Alcohol / Substance Use: Not Applicable Psych Involvement: No (comment)  Admission diagnosis:  Aphasia [R47.01] Seizure (HCC) [R56.9] Acute encephalopathy [G93.40] Patient Active Problem List   Diagnosis Date Noted   Seizure (HCC) 04/19/2024   Acute encephalopathy 03/20/2024   Falls frequently 02/16/2024   Seizure disorder (HCC) 02/16/2024   Slow transit constipation 02/16/2024   MCI (mild cognitive impairment) 05/19/2023   Pain and swelling of right lower leg 04/06/2023   Fracture of femoral neck, right, closed (HCC) 03/03/2023   Depression 03/03/2023   Pain due to onychomycosis of toenails of both feet 12/23/2019   Balance disorder 05/03/2018   Macular degeneration 03/31/2017   Difficulty swallowing pills 11/26/2016   Hyperlipidemia 09/01/2014   CKD (chronic kidney disease), stage III (HCC) 03/02/2014   Rosacea 11/23/2013   Major depression in full remission (HCC) 11/23/2013   History of TIA (transient ischemic attack) and stroke 03/21/2013   Hypertension 03/04/2012   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 [place] [address] Phone: [phone] Fax: [phone]     Social Drivers of Health (SDOH) Social History: SDOH Screenings   Food Insecurity: No Food Insecurity (04/19/2024)  Housing: Low Risk (04/19/2024)  Transportation Needs: No Transportation Needs (04/19/2024)  Utilities: Not At Risk (04/19/2024)  Depression (PHQ2-9): Low Risk (11/19/2023)  Financial Resource Strain: Low Risk (03/02/2023)  Physical Activity: Insufficiently Active (03/02/2023)  Social Connections: Moderately Isolated (04/19/2024)  Stress: No Stress Concern Present (03/02/2023)  Tobacco Use: Medium Risk (04/19/2024)  Health Literacy: Adequate Health Literacy (03/02/2023)   SDOH Interventions:     Readmission Risk Interventions     No data to display

## 2024-04-20 NOTE — Plan of Care (Signed)
  Problem: Clinical Measurements: Goal: Ability to maintain clinical measurements within normal limits will improve Outcome: Progressing Goal: Diagnostic test results will improve Outcome: Progressing Goal: Respiratory complications will improve Outcome: Progressing   

## 2024-04-20 NOTE — Procedures (Signed)
 Patient Name: Theresa [name]  MRN: [id number]  Epilepsy Attending: Arlin MALVA Krebs  Referring Physician/Provider: Waddell Karna LABOR, NP  Duration: 04/19/2024 1221 to 04/20/2024 1400  Patient history: 89yo F with seizure like episodes. EEG to evaluate for seizure  Level of alertness: Awake/lethargic, asleep  AEDs during EEG study: LCM  Technical aspects: This EEG study was done with scalp electrodes positioned according to the 10-20 International system of electrode placement. Electrical activity was reviewed with band pass filter of 1-70Hz , sensitivity of 7 uV/mm, display speed of 46mm/sec with a 60Hz  notched filter applied as appropriate. EEG data were recorded continuously and digitally stored.  Video monitoring was available and reviewed as appropriate.  Description: No clear posterior dominant rhythm was seen.  Sleep was characterized by sleep spindles (13 to 15 Hz), maximal frontocentral region.  EEG showed continuous generalized 3 to 6 Hz theta and delta slowing.  Hyperventilation and photic stimulation were not performed.     One seizure was noted on 04/19/2024 at 1344. At the beginning of the seizure, patient was lying in bed with eyes closed. She then appeared to open her eyes, had some chewing movements/oral automatisms and then started looking at her right hand.  She also appeared to have left hand automatisms. This was followed by bicycling movements which were difficult to see as patient's legs were under the sheets. Someone walked into the room asking questions but patient was not able to respond.  EEG initially showed amplitude sharply contoured 5 to 6 Hz theta slowing in left frontotemporal region which then evolved all of left hemisphere.  EEG then evolved into 2 to 3 Hz delta slowing and involved right hemisphere.  Total duration of seizure was about 1 minute 30 seconds.  One seizure was noted on 04/19/2024 at 1425.  During the seizure, initially no clinical signs were noted.   Eventually patient had left hand automatisms.  EEG showed high amplitude sharply contoured 5 to 6 Hz theta slowing in right frontotemporal region.  EEG then evolved into 1.5 to 2 Hz delta slowing and involved left hemisphere.  Duration of seizure was about 45 seconds.  One seizure was noted on 04/19/2024 at 1516. At the beginning of the seizure, patient was lying in bed and had some chewing movements/oral automatisms  She also appeared to have left hand automatisms. RN was in the room asking questions but patient was not able to respond. EEG initially showed amplitude sharply contoured 5 to 6 Hz theta slowing in left frontotemporal region which then evolved all of left hemisphere.  EEG then evolved into 2 to 3 Hz delta slowing and involved right hemisphere. Total duration of seizure was about 1 minute 45 seconds.  EEG was disconnected between 04/20/2024 0937 to 1115 for testing  ABNORMALITY - Focal seizure, left frontotemporal region - Focal seizure, right frontotemporal region - Continuous slow, generalized  IMPRESSION: This study showed two seizures arising from left frontotemporal region on 04/19/2024 at 1344 and 1516 as described above, lasting about 1 minute 30 seconds and 1 minute 45 seconds respectively.  Another seizure arising from right frontotemporal region was also noted on 04/19/2024 at 1425 as described above, lasting for about 45 seconds.  Additional EEG was suggestive of generalized cerebral dysfunction (encephalopathy)  Theresa [name] MALVA Krebs

## 2024-04-20 NOTE — Progress Notes (Signed)
 " PROGRESS NOTE  Ventura Leggitt  FMW:[id number] DOB: 1931-12-20 DOA: 04/19/2024 PCP: Charlanne Fredia CROME, MD   Brief Narrative: Patient is a 89 year old female with history of A-fib on Eliquis , prior CVA, seizure disorder, hypothyroidism, hyperlipidemia, hypertension, CKD, depression, GERD who presented with sudden onset of confusion, unresponsiveness.  She was found slumped over and drooling while watching TV.  Initially called as code stroke.  Neurology consulted.  CT head, CT angiogram negative  for LVO.  Metabolic encephalopathy is expiratory from seizure.  EEG showed focal seizure in the left frontotemporal region, right frontotemporal region.  Neurology closely following.  Assessment & Plan:  Principal Problem:   Seizure (HCC) Active Problems:   Acute encephalopathy  Acute encephalopathy secondary to seizure: History of seizures.  EEG showedfocal seizure in the left frontotemporal region, right frontotemporal region.  Neurology closely following.  CT imaging did not show any acute findings.  MRI planned for today Remains encephalopathic, obtunded.  On restraints, mittens Continue Vimpat    Paroxysmal A-fib: Remains in A-fib rhythm.  On metoprolol  for rate control.  On Eliquis  for anticoagulation.  Hyperlipidemia: On Crestor .  Hypothyroidism: On Synthyroid  Depression: On Lexapro   Debility/deconditioning/advanced age: Patient lives with her family.  Will consult PT/OT when appropriate.           DVT prophylaxis:apixaban  (ELIQUIS ) tablet 2.5 mg Start: 04/20/24 1000 SCDs Start: 04/19/24 1405 apixaban  (ELIQUIS ) tablet 2.5 mg     Code Status: Limited: Do not attempt resuscitation (DNR) -DNR-LIMITED -Do Not Intubate/DNI   Family Communication: None at bedside  Patient status:Inpatient  Patient is from :home  Anticipated discharge un:ynfz vs SNF  Estimated DC date:Not sure   Consultants: Neurology  Procedures:Continous EEG  Antimicrobials:  Anti-infectives (From  admission, onward)    None       Subjective: Patient seen and examined at bedside today.  She was lying on bed.  She was on continuous EEG.  She remains encephalopathic.  She is on mittens and restraints.  Not agitated.  Eyes closed.  Did not wake up on calling her name.  Going  for MRI.  Objective: Vitals:   04/19/24 1629 04/19/24 2036 04/20/24 0014 04/20/24 0302  BP: (!) 156/88 (!) 176/84 (!) 168/90 137/62  Pulse: (!) 109 (!) 110 87 70  Resp: 19  15 19   Temp: 97.9 F (36.6 C) (!) 97.5 F (36.4 C) 98 F (36.7 C) 98 F (36.7 C)  TempSrc: Oral Axillary Axillary Oral  SpO2: 100%   100%  Weight: 57.3 kg     Height: 5' (1.524 m)       Intake/Output Summary (Last 24 hours) at 04/20/2024 0930 Last data filed at 04/20/2024 0700 Gross per 24 hour  Intake 873.4 ml  Output 0 ml  Net 873.4 ml   Filed Weights   04/19/24 1629  Weight: 57.3 kg    Examination:  General exam: Moderate distress, encephalopathic, lying in bed HEENT: Eyes closed,EEG leads on the scalp Respiratory system:  no wheezes or crackles  Cardiovascular system: Irregularly irregular rhythm. Gastrointestinal system: Abdomen is nondistended, soft and nontender. Central nervous system: Not alert or oriented Extremities: No edema, no clubbing ,no cyanosis Skin: No rashes, no ulcers,no icterus     Data Reviewed: I have personally reviewed following labs and imaging studies  CBC: Recent Labs  Lab 04/19/24 1058 04/19/24 1103 04/20/24 0802  WBC 11.2*  --  9.0  NEUTROABS 7.8*  --   --   HGB 13.0 13.3 11.8*  HCT 41.3 39.0  35.1*  MCV 101.0*  --  94.6  PLT 166  --  147*   Basic Metabolic Panel: Recent Labs  Lab 04/19/24 1058 04/19/24 1103 04/20/24 0802  NA 138 140 141  K 5.1 4.6 3.6  CL 104 104 106  CO2 22  --  26  GLUCOSE 159* 159* 95  BUN 18 20 17   CREATININE 1.14* 1.10* 0.79  CALCIUM  9.1  --  8.6*     No results found for this or any previous visit (from the past 240 hours).   Radiology  Studies: Overnight EEG with video Result Date: 04/20/2024 Shelton Arlin KIDD, MD     04/20/2024  9:06 AM Patient Name: Norman Bier MRN: [id number] Epilepsy Attending: Arlin KIDD Shelton Referring Physician/Provider: Waddell Karna LABOR, NP Duration: 04/19/2024 1221 to 04/20/2024 0845 Patient history: 89yo F with seizure like episodes. EEG to evaluate for seizure Level of alertness: Awake/lethargic, aslepe AEDs during EEG study: LCM Technical aspects: This EEG study was done with scalp electrodes positioned according to the 10-20 International system of electrode placement. Electrical activity was reviewed with band pass filter of 1-70Hz , sensitivity of 7 uV/mm, display speed of 75mm/sec with a 60Hz  notched filter applied as appropriate. EEG data were recorded continuously and digitally stored.  Video monitoring was available and reviewed as appropriate. Description: No clear posterior dominant rhythm was seen.  Sleep was characterized by sleep spindles (13 to 15 Hz), maximal frontocentral region.  EEG showed continuous generalized 3 to 6 Hz theta and delta slowing.  Hyperventilation and photic stimulation were not performed.   One seizure was noted on 04/19/2024 at 1344. At the beginning of the seizure, patient was lying in bed with eyes closed. She then appeared to open her eyes, had some chewing movements/oral automatisms and then started looking at her right hand.  She also appeared to have left hand automatisms. This was followed by bicycling movements which were difficult to see as patient's legs were under the sheets. Someone walked into the room asking questions but patient was not able to respond.  EEG initially showed amplitude sharply contoured 5 to 6 Hz theta slowing in left frontotemporal region which then evolved all of left hemisphere.  EEG then evolved into 2 to 3 Hz delta slowing and involved right hemisphere.  Total duration of seizure was about 1 minute 30 seconds. One seizure was noted on 04/19/2024 at  1425.  During the seizure, initially no clinical signs were noted.  Eventually patient had left hand automatisms.  EEG showed high amplitude sharply contoured 5 to 6 Hz theta slowing in right frontotemporal region.  EEG then evolved into 1.5 to 2 Hz delta slowing and involved left hemisphere.  Duration of seizure was about 45 seconds. One seizure was noted on 04/19/2024 at 1516. At the beginning of the seizure, patient was lying in bed and had some chewing movements/oral automatisms  She also appeared to have left hand automatisms. RN was in the room asking questions but patient was not able to respond. EEG initially showed amplitude sharply contoured 5 to 6 Hz theta slowing in left frontotemporal region which then evolved all of left hemisphere.  EEG then evolved into 2 to 3 Hz delta slowing and involved right hemisphere. Total duration of seizure was about 1 minute 45 seconds. ABNORMALITY - Focal seizure, left frontotemporal region - Focal seizure, right frontotemporal region - Continuous low, generalized IMPRESSION: This study showed two seizures arising from left frontotemporal region on 04/19/2024 at 1344 and 1516  as described above, lasting about 1 minute 30 seconds and 1 minute 45 seconds respectively. Another seizure arising from right frontotemporal region was also noted on 04/19/2024 at 1425 as described above, lasting for about 45 seconds. Additional EEG was suggestive of generalized cerebral dysfunction (encephalopathy) Arlin MALVA Krebs   DG Chest Portable 1 View Result Date: 04/19/2024 CLINICAL DATA:  Altered mental status.  Stroke. EXAM: PORTABLE CHEST 1 VIEW COMPARISON:  Radiographs 03/20/2024 and 02/15/2024.  CT 02/15/2024. FINDINGS: 1304 hours. The heart size and mediastinal contours are stable with cardiomegaly and aortic atherosclerosis post mitral valve repair. There is interval improved aeration of the lung bases. No edema, confluent airspace disease, pneumothorax or significant pleural effusion.  Old rib fractures are present on the left. Retained epicardial pacing leads, thoracolumbar scoliosis and postsurgical changes in the lumbar spine noted. IMPRESSION: Interval improved aeration of the lung bases. No acute cardiopulmonary process. Electronically Signed   By: Elsie Perone M.D.   On: 04/19/2024 13:30   CT ANGIO HEAD NECK W WO CM (CODE STROKE) Result Date: 04/19/2024 EXAM: CTA HEAD AND NECK WITHOUT AND WITH 04/19/2024 11:09:15 AM TECHNIQUE: CTA of the head and neck was performed without and with the administration of 75 mL of intravenous iohexol  (OMNIPAQUE ) 350 MG/ML injection. Multiplanar 2D and/or 3D reformatted images are provided for review. Automated exposure control, iterative reconstruction, and/or weight based adjustment of the mA/kV was utilized to reduce the radiation dose to as low as reasonably achievable. Stenosis of the internal carotid arteries measured using NASCET criteria. COMPARISON: CT of the head dated 04/19/2024. CLINICAL HISTORY: Neuro deficit, acute, stroke suspected. Acute neurological deficit; stroke suspected. FINDINGS: CTA NECK: AORTIC ARCH AND ARCH VESSELS: No dissection or arterial injury. No significant stenosis of the brachiocephalic or subclavian arteries. CERVICAL CAROTID ARTERIES: Minimal calcific plaque within the carotid bulbs with no appreciable stenosis. The cervical segments of the internal carotid arteries are moderately tortuous bilaterally, but normal in caliber. No dissection or arterial injury. CERVICAL VERTEBRAL ARTERIES: The right vertebral artery is dominant and the left vertebral artery is hypoplastic. No dissection, arterial injury, or significant stenosis. LUNGS AND MEDIASTINUM: Unremarkable. SOFT TISSUES: No acute abnormality. BONES: Grade 1 anterolisthesis at C3-C4. Reversal of the normal cervical lordosis. Fusion of the C4-C5 and C5-C6 disc spaces. Moderate chronic degenerative disc disease at C6-C7. Grade 1 degenerative anterolisthesis at C7-T1.  CTA HEAD: ANTERIOR CIRCULATION: Mild calcific atheromatous disease within the carotid siphons with no associated stenosis. No significant stenosis of the internal carotid arteries. No significant stenosis of the anterior cerebral arteries. No significant stenosis of the middle cerebral arteries. No aneurysm. POSTERIOR CIRCULATION: No significant stenosis of the posterior cerebral arteries. No significant stenosis of the basilar artery. No significant stenosis of the vertebral arteries. No aneurysm. OTHER: No dural venous sinus thrombosis on this non-dedicated study. The above findings were communicated to Dr. Michaela at 11:19 AM 04/19/2024. IMPRESSION: 1. No large vessel occlusion, hemodynamically significant stenosis, or aneurysm in the head or neck. 2. Mild calcific atheromatous disease within the carotid siphons without associated stenosis. 3. Minimal calcific plaque within the carotid bulbs without appreciable stenosis. 4. Moderately tortuous cervical segments of the internal carotid arteries bilaterally, normal in caliber. 5. Dominant right vertebral artery and hypoplastic left vertebral artery. 6. Findings communicated to Dr. Michaela at 11:19 AM on 04/19/24. Electronically signed by: Evalene Coho MD 04/19/2024 11:21 AM EST RP Workstation: [id number]   CT HEAD CODE STROKE WO CONTRAST Result Date: 04/19/2024 EXAM: CT HEAD WITHOUT CONTRAST 04/19/2024 11:02:18  AM TECHNIQUE: CT of the head was performed without the administration of intravenous contrast. Automated exposure control, iterative reconstruction, and/or weight based adjustment of the mA/kV was utilized to reduce the radiation dose to as low as reasonably achievable. COMPARISON: CT of the head dated 03/20/2024. CLINICAL HISTORY: Neuro deficit, acute, stroke suspected. Acute neurologic deficit; stroke suspected. FINDINGS: BRAIN AND VENTRICLES: Age-related atrophy and mild-to-moderate cerebral white matter disease. Chronic encephalomalacia  changes are again demonstrated at the left frontoparietal junction from remote MCA distribution infarct. No acute hemorrhage. No evidence of acute infarct. No hydrocephalus. No extra-axial collection. No mass effect or midline shift. ORBITS: The patient is status post bilateral lens replacement. SINUSES: Mild mucoperiosteal thickening present within the right maxillary sinus. SOFT TISSUES AND SKULL: No acute soft tissue abnormality. No skull fracture. [place] Stroke Program Early CT Score (ASPECTS) ----- Ganglionic (caudate, IC, lentiform nucleus, insula, M1-M3): 7 Supraganglionic (M4-M6): 3 Total: 10 IMPRESSION: 1. No acute intracranial abnormality in the setting of acute neurologic deficit; no acute hemorrhage, mass effect, or acute cortical infarct. 2. Age-related atrophy and mild-to-moderate chronic cerebral white matter disease. 3. Chronic encephalomalacia at the left frontoparietal junction from remote MCA distribution infarct. 4. Findings communicated to Dr. Michaela at 11:06 AM on 04/19/24. 5. ASPECTS: 10. Electronically signed by: Evalene Coho MD 04/19/2024 11:08 AM EST RP Workstation: [id number]    Scheduled Meds:  apixaban   2.5 mg Oral BID   docusate sodium   100 mg Oral BID   escitalopram   15 mg Oral Daily   fluticasone   2 spray Each Nare Daily   lacosamide   200 mg Oral BID   levothyroxine   75 mcg Oral Q0600   metoprolol  tartrate  25 mg Oral BID   multivitamin  1 tablet Oral Daily   rosuvastatin   10 mg Oral QHS   Continuous Infusions:  sodium chloride  Stopped (04/20/24 0558)     LOS: 1 day   Ivonne Mustache, MD [facility] P2/06/2024, 9:30 AM  "

## 2024-04-21 ENCOUNTER — Inpatient Hospital Stay (HOSPITAL_COMMUNITY)

## 2024-04-21 DIAGNOSIS — R569 Unspecified convulsions: Secondary | ICD-10-CM | POA: Diagnosis not present

## 2024-04-21 MED ORDER — METOPROLOL TARTRATE 50 MG PO TABS
50.0000 mg | ORAL_TABLET | Freq: Two times a day (BID) | ORAL | Status: DC
  Administered 2024-04-21 (×2): 50 mg via ORAL
  Filled 2024-04-21 (×2): qty 1

## 2024-04-21 NOTE — Progress Notes (Signed)
 NEUROLOGY CONSULT FOLLOW UP NOTE   Date of service: April 21, 2024 Patient Name: Theresa [name] MRN:  [id number] DOB:  02/13/1932  Interval Hx/subjective   LTM read notes no seizures overnight.  She has further improved alertness and mental status today, but still has some mild word-finding difficulty and confusion.   Vitals   Vitals:   04/20/24 1922 04/21/24 0015 04/21/24 0016 04/21/24 1106  BP: (!) 163/66 (!) 165/72  (!) 130/93  Pulse: 76 88  (!) 105  Resp:  16  15  Temp: 97.8 F (36.6 C)  98.6 F (37 C) (!) 97.1 F (36.2 C)  TempSrc: Oral  Axillary Oral  SpO2: 100% 94%  100%  Weight:      Height:         Body mass index is 24.67 kg/m.  Physical Exam   Constitutional: Appears well-developed and well-nourished.  Cardiovascular: Normal rate and regular rhythm.  Respiratory: Effort normal, non-labored breathing.   Neurologic Examination   Neuro: Mental Status: Patient is awake, alert, oriented to person, age, birthday, year. Confused to place When asked where she is, she says she does not know. She can tell me what facility she lives in and that her husband is there. She is worried about him, because she says he worries about me a lot. She does have some continued word-finding difficulty, which she says she has at home sometimes, but not as much as she is currently having.  Cranial Nerves: II: Visual Fields are full. Pupils are equal, round, and reactive to light.   III,IV, VI: EOMI without ptosis or diploplia.  V: Facial sensation is symmetric to light touch VII: Facial movement is symmetric.  VIII: hearing is intact to voice X: Uvula elevates symmetrically XI: Shoulder shrug is symmetric. XII: tongue is midline without atrophy or fasciculations.  Motor: Tone is normal. Bulk is normal.  Generalized weakness, no drifts.  Sensory: Sensation is symmetric to light touch in the arms and legs. Cerebellar: FNF slow but intact. No ataxia.     Medications Current Medications[1]  Labs and Diagnostic Imaging   CBC:  Recent Labs  Lab 04/19/24 1058 04/19/24 1103 04/20/24 0802  WBC 11.2*  --  9.0  NEUTROABS 7.8*  --   --   HGB 13.0 13.3 11.8*  HCT 41.3 39.0 35.1*  MCV 101.0*  --  94.6  PLT 166  --  147*    Basic Metabolic Panel:  Lab Results  Component Value Date   NA 141 04/20/2024   K 3.6 04/20/2024   CO2 26 04/20/2024   GLUCOSE 95 04/20/2024   BUN 17 04/20/2024   CREATININE 0.79 04/20/2024   CALCIUM  8.6 (L) 04/20/2024   GFRNONAA >60 04/20/2024   GFRAA 76 (L) 03/21/2013   Lipid Panel:  Lab Results  Component Value Date   LDLCALC 57 04/07/2023   HgbA1c:  Lab Results  Component Value Date   HGBA1C 5.5 03/06/2023   Urine Drug Screen:     Component Value Date/Time   LABOPIA NONE DETECTED 03/21/2013 1242   COCAINSCRNUR NONE DETECTED 03/21/2013 1242   LABBENZ NONE DETECTED 03/21/2013 1242   AMPHETMU NONE DETECTED 03/21/2013 1242   THCU NONE DETECTED 03/21/2013 1242   LABBARB NONE DETECTED 03/21/2013 1242    Alcohol Level     Component Value Date/Time   ETH <15 04/19/2024 1110   INR  Lab Results  Component Value Date   INR 1.1 04/20/2024   APTT  Lab Results  Component Value  Date   APTT 31 04/20/2024   AED levels: No results found for: PHENYTOIN, ZONISAMIDE, LAMOTRIGINE, LEVETIRACETA  CT Head without contrast(Personally reviewed): No acute intracranial abnormality in the setting of acute neurologic deficit; no acute hemorrhage, mass effect, or acute cortical infarct. Age-related atrophy and mild-to-moderate chronic cerebral white matter disease. Chronic encephalomalacia at the left frontoparietal junction from remote MCA distribution infarct  CT angio Head and Neck with contrast(Personally reviewed): No LVO   MRI Brain(Personally reviewed): Pending.   cEEG: 04/19/24 1221 to 04/20/24 0845 ABNORMALITY - Focal seizure, left frontotemporal region - Focal seizure, right  frontotemporal region - Continuous low, generalized IMPRESSION: This study showed two seizures arising from left frontotemporal region on 04/19/2024 at 1344 and 1516 as described above, lasting about 1 minute 30 seconds and 1 minute 45 seconds respectively. Another seizure arising from right frontotemporal region was also noted on 04/19/2024 at 1425 as described above, lasting for about 45 seconds. Additional EEG was suggestive of generalized cerebral dysfunction (encephalopathy)  cEEG 04/20/24 1400 to 04/21/24 1015 ABNORMALITY  - Continuous slow, generalized   IMPRESSION: This technically difficult study was suggestive of generalized cerebral dysfunction (encephalopathy). No seizures were noted.  Assessment   Glennice Marcos is a 89 y.o. female  hx of A fib on Eliquis , prior CVA, seizures on VImpat , hypothyroidism, HLD, HTN, CKD, depression, GERD who presents from independent facility, witnessed by husband She lifted her arm and then started rubbing her fingers together and was unresponsive for 30-60seconds and then was noted to be altered. CT head with no acute process. CTA head and neck with no LVO. Breakthrough seizure was suspected and she was connected to LTM. Read this AM notes 2 seziures, but none since Vimpat  load and maintenance dose given. MRI brain completed today is negative. CXR negative, UA not completed yet, Labs unremarkable.   On exam this morning, Patient has continued to improve in her mental status and alertness. 2/4 EEG showed the known seizures, but no activity since she received the Vimpat . 2/5 EEG shows no seizures.  Last seizure was 2/3 at 3:16 PM  Recommendations   - Discontinue LTM - Continue Vimpat  - Continue seizure precautions - Continue delirium precautions ______________________________________________________________________  Signed, Rocky JAYSON Likes, NP [facility]   I have seen the patient and reviewed the above note.  She continues to improve,  and seems to be doing well on the higher dose of Vimpat .  I would expect her to continue to gradually improve over time.  With no seizures now for 48 hours, we can discontinue EEG monitoring.  I would continue her current dose of Vimpat , and she can follow-up with outpatient neurology.  Neurology will continue to be available as needed, please call if further questions or concerns.  Aisha Seals, MD [facility]   If 7pm- 7am, please page neurology on call as listed in AMION.       [1]  Current Facility-Administered Medications:    apixaban  (ELIQUIS ) tablet 2.5 mg, 2.5 mg, Oral, BID, Georgina Basket, MD, 2.5 mg at 04/21/24 1001   docusate sodium  (COLACE) capsule 100 mg, 100 mg, Oral, BID, Georgina Basket, MD, 100 mg at 04/21/24 1001   escitalopram  (LEXAPRO ) tablet 15 mg, 15 mg, Oral, Daily, Georgina Basket, MD, 15 mg at 04/21/24 1001   fluticasone  (FLONASE ) 50 MCG/ACT nasal spray 2 spray, 2 spray, Each Nare, Daily, Georgina Basket, MD, 2 spray at 04/20/24 1122   lacosamide  (VIMPAT ) tablet 200 mg, 200 mg, Oral, BID, Seals Aisha SQUIBB,  MD, 200 mg at 04/21/24 1001   levothyroxine  (SYNTHROID ) tablet 75 mcg, 75 mcg, Oral, Q0600, Georgina Basket, MD, 75 mcg at 04/21/24 0631   LORazepam  (ATIVAN ) injection 1 mg, 1 mg, Intravenous, Once PRN, Opyd, Timothy S, MD   metoprolol  tartrate (LOPRESSOR ) tablet 50 mg, 50 mg, Oral, BID, Adhikari, Amrit, MD, 50 mg at 04/21/24 1005   multivitamin (PROSIGHT) tablet 1 tablet, 1 tablet, Oral, Daily, Georgina Basket, MD, 1 tablet at 04/21/24 1001   polyethylene glycol (MIRALAX  / GLYCOLAX ) packet 17 g, 17 g, Oral, Daily PRN, Georgina Basket, MD   rosuvastatin  (CRESTOR ) tablet 10 mg, 10 mg, Oral, QHS, Moore, Willie, MD, 10 mg at 04/20/24 2149

## 2024-04-21 NOTE — Evaluation (Signed)
 Occupational Therapy Evaluation Patient Details Name: Theresa [name] MRN: [id number] DOB: Feb 16, 1932 Today's Date: 04/21/2024   History of Present Illness   Pt is a 89 y.o female admitted 2/3 from [facility] for syncope episode, came to with R sided weakness and AMS. CRI showed no acute changes. EEG showed 2 focal seizures in L & R frontotemporal region. PMH: afib, CVA, seizures, hypothyroidism, HLD, HTN, CKD, recent R hip sx 02/2024     Clinical Impressions Pt admitted based on above, and was seen based on problem list below. Pt reports PTA she was living at [facility] and was using RW for mobility, with staff assisting with ADLs prn. Today pt is requiring set up  to min  assist for  ADLs. Functional transfers are  CGA with use of RW. Limited to march in place d/t EEG wires. Noted pt with difficulty expressing self, poor stm, problem solving, and reasoning skills. Recommendation of <3 hours of skilled rehab daily. OT will continue to follow acutely to maximize functional independence.     If plan is discharge home, recommend the following:   A little help with walking and/or transfers;A little help with bathing/dressing/bathroom;Assistance with cooking/housework;Supervision due to cognitive status;Assist for transportation     Functional Status Assessment   Patient has had a recent decline in their functional status and demonstrates the ability to make significant improvements in function in a reasonable and predictable amount of time.     Equipment Recommendations   Other (comment) (Defer to next venue)      Precautions/Restrictions   Precautions Precautions: Fall Recall of Precautions/Restrictions: Impaired Restrictions Weight Bearing Restrictions Per Provider Order: No     Mobility Bed Mobility Overal bed mobility: Modified Independent     General bed mobility comments: HOB elevated    Transfers Overall transfer level: Needs assistance Equipment  used: Rolling walker (2 wheels) Transfers: Sit to/from Stand Sit to Stand: Contact guard assist           General transfer comment: CGA for balance, limited to marching in place d/t EEG wires      Balance Overall balance assessment: Needs assistance Sitting-balance support: No upper extremity supported, Feet supported Sitting balance-Leahy Scale: Fair     Standing balance support: Bilateral upper extremity supported, During functional activity, Reliant on assistive device for balance Standing balance-Leahy Scale: Poor Standing balance comment: Reliant on RW           ADL either performed or assessed with clinical judgement   ADL Overall ADL's : Needs assistance/impaired Eating/Feeding: Set up;Sitting   Grooming: Set up;Sitting   Upper Body Bathing: Set up;Sitting   Lower Body Bathing: Minimal assistance;Sit to/from stand   Upper Body Dressing : Set up;Sitting   Lower Body Dressing: Minimal assistance;Sit to/from stand               Functional mobility during ADLs: Contact guard assist;Rolling walker (2 wheels) General ADL Comments: Limited d/t EEG     Vision Baseline Vision/History: 0 No visual deficits Vision Assessment?: Vision impaired- to be further tested in functional context Additional Comments: Difficult to assess d/t cog and impaired communication            Pertinent Vitals/Pain Pain Assessment Pain Assessment: No/denies pain     Extremity/Trunk Assessment Upper Extremity Assessment Upper Extremity Assessment: Generalized weakness   Lower Extremity Assessment Lower Extremity Assessment: Defer to PT evaluation   Cervical / Trunk Assessment Cervical / Trunk Assessment: Kyphotic   Communication Communication Communication:  Impaired Factors Affecting Communication: Difficulty expressing self   Cognition Arousal: Alert Behavior During Therapy: WFL for tasks assessed/performed Cognition: No family/caregiver present to determine  baseline, Cognition impaired   Orientation impairments: Time, Place Awareness: Online awareness impaired Memory impairment (select all impairments): Working civil service fast streamer, Emergency Planning/management Officer functioning impairment (select all impairments): Problem solving, Reasoning OT - Cognition Comments: Pt with difficulty expressing self. Reporting today is Monday, initally reports to OT she is at [facility], when MD enters and asks reports [facility]. Aware of concerns for seizure/CVA. Recalls events leading up to syncope       Following commands: Impaired Following commands impaired: Follows one step commands inconsistently, Follows one step commands with increased time     Cueing  General Comments   Cueing Techniques: Verbal cues  Max HR 133 BPM with activity, BP elevated, MD present and aware           Home Living Family/patient expects to be discharged to:: Skilled nursing facility       Additional Comments: [facility]      Prior Functioning/Environment Prior Level of Function : Needs assist;Patient poor historian/Family not available       Mobility Comments: Reports use of RW ADLs Comments: Reports mod I, with staff available to assist as needed    OT Problem List: Decreased strength;Decreased activity tolerance;Impaired balance (sitting and/or standing);Impaired vision/perception;Decreased cognition;Decreased safety awareness;Decreased knowledge of use of DME or AE   OT Treatment/Interventions: Self-care/ADL training;Therapeutic exercise;Energy conservation;DME and/or AE instruction;Therapeutic activities;Patient/family education;Balance training      OT Goals(Current goals can be found in the care plan section)   Acute Rehab OT Goals Patient Stated Goal: None stated OT Goal Formulation: With patient Time For Goal Achievement: 05/05/24 Potential to Achieve Goals: Good   OT Frequency:  Min 2X/week       AM-PAC OT 6 Clicks Daily Activity     Outcome Measure Help  from another person eating meals?: None Help from another person taking care of personal grooming?: A Little Help from another person toileting, which includes using toliet, bedpan, or urinal?: Total Help from another person bathing (including washing, rinsing, drying)?: A Lot Help from another person to put on and taking off regular upper body clothing?: A Little Help from another person to put on and taking off regular lower body clothing?: A Lot 6 Click Score: 15   End of Session Equipment Utilized During Treatment: Gait belt;Rolling walker (2 wheels) Nurse Communication: Mobility status  Activity Tolerance: Patient tolerated treatment well Patient left: in bed;with call bell/phone within reach;with bed alarm set;with restraints reapplied  OT Visit Diagnosis: Unsteadiness on feet (R26.81);Other abnormalities of gait and mobility (R26.89);Muscle weakness (generalized) (M62.81);History of falling (Z91.81)                Time: 9099-9057 OT Time Calculation (min): 42 min Charges:  OT General Charges $OT Visit: 1 Visit OT Evaluation $OT Eval Moderate Complexity: 1 Mod OT Treatments $Self Care/Home Management : 23-37 mins  Adrianne BROCKS, OT  Acute Rehabilitation Services Office [phone] Secure chat preferred   Adrianne GORMAN Savers 04/21/2024, 12:12 PM

## 2024-04-21 NOTE — Progress Notes (Signed)
 " PROGRESS NOTE  Theresa [name]  FMW:[id number] DOB: 12/04/1931 DOA: 04/19/2024 PCP: Charlanne Fredia CROME, MD   Brief Narrative: Patient is a 89 year old female from [facility] with history of A-fib on Eliquis , prior CVA, seizure disorder, hypothyroidism, hyperlipidemia, hypertension, CKD, depression, GERD who presented with sudden onset of confusion, unresponsiveness.  She was found slumped over and drooling while watching TV.  Initially called as code stroke.  Neurology consulted.  CT head, CT angiogram negative  for LVO.  MRI of the brain did not show any acute findings.  Metabolic encephalopathy was suspected  from seizure.  EEG showed focal seizure in the left frontotemporal region, right frontotemporal region but continuous EEG did not show any seizure, showed generalized cerebral dysfunction.  PT/OT consulted.  Neurology closely following.  Assessment & Plan:  Principal Problem:   Seizure (HCC) Active Problems:   Acute encephalopathy  Acute encephalopathy secondary to seizure: History of seizures.  EEG showedfocal seizure in the left frontotemporal region, right frontotemporal region.  Neurology closely following.  CT imaging did not show any acute findings.  MRI did not show any acute findings, showed chronic ischemic and small vessel disease. Mentation has improved today but he still remains confused.  She is alert and awake and communicates otherwise commands today. Continue Vimpat    Paroxysmal A-fib: Remains in A-fib rhythm.  On metoprolol  for rate control.  On Eliquis  for anticoagulation.  Dose of metoprolol  increased.  Hypertension: Blood pressure was severely elevated this morning.  Increase the dose of metoprolol   Hyperlipidemia: On Crestor .  Hypothyroidism: On Synthyroid  Depression: On Lexapro   Debility/deconditioning/advanced age/goals of care: Patient lives at [facility]. consulted PT/OT.  She has a power of attorney Rosaline Sharps.  CODE STATUS is DNI          DVT prophylaxis:apixaban  (ELIQUIS ) tablet 2.5 mg Start: 04/20/24 1000 SCDs Start: 04/19/24 1405 apixaban  (ELIQUIS ) tablet 2.5 mg     Code Status: Limited: Do not attempt resuscitation (DNR) -DNR-LIMITED -Do Not Intubate/DNI   Family Communication: Called Rosaline Sharps ,HCPOA on hone twice,calls not received  Patient status:Inpatient  Patient is from :home  Anticipated discharge un:ynfz vs SNF  Estimated DC date:Not sure   Consultants: Neurology  Procedures:Continous EEG  Antimicrobials:  Anti-infectives (From admission, onward)    None       Subjective: Patient seen and examined at bedside today.  Remains in A-fib rhythm, blood pressure elevated, heart rate in the range of 110.  She was lying on bed, appeared more coherent today but still remains confused.  Obeys commands.  Communicates Does not appear to be in any acute distress  Objective: Vitals:   04/20/24 1922 04/21/24 0015 04/21/24 0016 04/21/24 1106  BP: (!) 163/66 (!) 165/72  (!) 130/93  Pulse: 76 88  (!) 105  Resp:  16  15  Temp: 97.8 F (36.6 C)  98.6 F (37 C)   TempSrc: Oral  Axillary   SpO2: 100% 94%  100%  Weight:      Height:        Intake/Output Summary (Last 24 hours) at 04/21/2024 1116 Last data filed at 04/20/2024 1600 Gross per 24 hour  Intake --  Output 400 ml  Net -400 ml   Filed Weights   04/19/24 1629  Weight: 57.3 kg    Examination:  General exam: Overall comfortable, not in distress, lying in bed HEENT: PERRL Respiratory system:  no wheezes or crackles  Cardiovascular system: Irregularly irregular rhythm Gastrointestinal system: Abdomen is nondistended, soft  and nontender. Central nervous system: Alert and awake  but confused  extremities: No edema, no clubbing ,no cyanosis Skin: No rashes, no ulcers,no icterus       Data Reviewed: I have personally reviewed following labs and imaging studies  CBC: Recent Labs  Lab 04/19/24 1058 04/19/24 1103 04/20/24 0802  WBC  11.2*  --  9.0  NEUTROABS 7.8*  --   --   HGB 13.0 13.3 11.8*  HCT 41.3 39.0 35.1*  MCV 101.0*  --  94.6  PLT 166  --  147*   Basic Metabolic Panel: Recent Labs  Lab 04/19/24 1058 04/19/24 1103 04/20/24 0802  NA 138 140 141  K 5.1 4.6 3.6  CL 104 104 106  CO2 22  --  26  GLUCOSE 159* 159* 95  BUN 18 20 17   CREATININE 1.14* 1.10* 0.79  CALCIUM  9.1  --  8.6*     No results found for this or any previous visit (from the past 240 hours).   Radiology Studies: MR BRAIN W WO CONTRAST Result Date: 04/20/2024 EXAM: MRI BRAIN WITH AND WITHOUT CONTRAST 04/20/2024 10:31:28 AM TECHNIQUE: Multiplanar multisequence MRI of the head/brain was performed with and without the administration of intravenous contrast. COMPARISON: CT head, CTA head and neck 04/19/2024 and previous brain MRI 03/21/2024. CLINICAL HISTORY: 89 year old female with seizure disorder, clinical change, and status post code stroke presentation 04/19/2024. FINDINGS: BRAIN AND VENTRICLES: No acute infarct. No acute intracranial hemorrhage. No mass effect or midline shift. No hydrocephalus. The sella is unremarkable. Normal flow voids. Following contrast, major dural venous sinuses are enhancing and appear patent. Stable chronic infarct with encephalomalacia posterior left MCA territory. Numerous small chronic infarcts scattered in the bilateral cerebellar hemispheres with multiple chronic cerebellar microhemorrhages, including in the midline vermis. T2 and FLAIR hyperintensity in the right dorsal medulla (series 6 image 6) appears new since last month but has not been abnormal on diffusion. Brainstem elsewhere is within normal limits for age. Only occasional chronic supratentorial blood products. No other new signal abnormality in the brain. Chronic lacunar infarcts bilaterally with deep gray nuclei. Stable patchy and scattered chronic cerebral white matter T2 and FLAIR hyperintensity including some deep white matter capsule involvement. On  thin sliced coronal images, the hippocampal formations and mesial temporal lobes appear symmetric. No abnormal intracranial enhancement or dural thickening identified. ORBITS: No significant abnormality. SINUSES: No significant abnormality. Normal visible internal auditory structures. BONES AND SOFT TISSUES: Advanced cervical spine degeneration with ankylosis visible. Normal bone marrow signal. No soft tissue abnormality. IMPRESSION: 1. No acute intracranial abnormality. 2. Chronic ischemic and small vessel disease, with possible progression in the right medulla since last month. Electronically signed by: Helayne Hurst MD 04/20/2024 11:01 AM EST RP Workstation: [id number]   Overnight EEG with video Result Date: 04/20/2024 Shelton Arlin KIDD, MD     04/21/2024 10:08 AM Patient Name: Theresa [name] MRN: [id number] Epilepsy Attending: Arlin KIDD Shelton Referring Physician/Provider: Waddell Karna LABOR, NP Duration: 04/19/2024 1221 to 04/20/2024 1400 Patient history: 89yo F with seizure like episodes. EEG to evaluate for seizure Level of alertness: Awake/lethargic, asleep AEDs during EEG study: LCM Technical aspects: This EEG study was done with scalp electrodes positioned according to the 10-20 International system of electrode placement. Electrical activity was reviewed with band pass filter of 1-70Hz , sensitivity of 7 uV/mm, display speed of 78mm/sec with a 60Hz  notched filter applied as appropriate. EEG data were recorded continuously and digitally stored.  Video monitoring was available and reviewed as  appropriate. Description: No clear posterior dominant rhythm was seen.  Sleep was characterized by sleep spindles (13 to 15 Hz), maximal frontocentral region.  EEG showed continuous generalized 3 to 6 Hz theta and delta slowing.  Hyperventilation and photic stimulation were not performed.   One seizure was noted on 04/19/2024 at 1344. At the beginning of the seizure, patient was lying in bed with eyes closed. She then appeared  to open her eyes, had some chewing movements/oral automatisms and then started looking at her right hand.  She also appeared to have left hand automatisms. This was followed by bicycling movements which were difficult to see as patient's legs were under the sheets. Someone walked into the room asking questions but patient was not able to respond.  EEG initially showed amplitude sharply contoured 5 to 6 Hz theta slowing in left frontotemporal region which then evolved all of left hemisphere.  EEG then evolved into 2 to 3 Hz delta slowing and involved right hemisphere.  Total duration of seizure was about 1 minute 30 seconds. One seizure was noted on 04/19/2024 at 1425.  During the seizure, initially no clinical signs were noted.  Eventually patient had left hand automatisms.  EEG showed high amplitude sharply contoured 5 to 6 Hz theta slowing in right frontotemporal region.  EEG then evolved into 1.5 to 2 Hz delta slowing and involved left hemisphere.  Duration of seizure was about 45 seconds. One seizure was noted on 04/19/2024 at 1516. At the beginning of the seizure, patient was lying in bed and had some chewing movements/oral automatisms  She also appeared to have left hand automatisms. RN was in the room asking questions but patient was not able to respond. EEG initially showed amplitude sharply contoured 5 to 6 Hz theta slowing in left frontotemporal region which then evolved all of left hemisphere.  EEG then evolved into 2 to 3 Hz delta slowing and involved right hemisphere. Total duration of seizure was about 1 minute 45 seconds. EEG was disconnected between 04/20/2024 0937 to 1115 for testing ABNORMALITY - Focal seizure, left frontotemporal region - Focal seizure, right frontotemporal region - Continuous slow, generalized IMPRESSION: This study showed two seizures arising from left frontotemporal region on 04/19/2024 at 1344 and 1516 as described above, lasting about 1 minute 30 seconds and 1 minute 45 seconds  respectively. Another seizure arising from right frontotemporal region was also noted on 04/19/2024 at 1425 as described above, lasting for about 45 seconds. Additional EEG was suggestive of generalized cerebral dysfunction (encephalopathy) Arlin MALVA Krebs   DG Chest Portable 1 View Result Date: 04/19/2024 CLINICAL DATA:  Altered mental status.  Stroke. EXAM: PORTABLE CHEST 1 VIEW COMPARISON:  Radiographs 03/20/2024 and 02/15/2024.  CT 02/15/2024. FINDINGS: 1304 hours. The heart size and mediastinal contours are stable with cardiomegaly and aortic atherosclerosis post mitral valve repair. There is interval improved aeration of the lung bases. No edema, confluent airspace disease, pneumothorax or significant pleural effusion. Old rib fractures are present on the left. Retained epicardial pacing leads, thoracolumbar scoliosis and postsurgical changes in the lumbar spine noted. IMPRESSION: Interval improved aeration of the lung bases. No acute cardiopulmonary process. Electronically Signed   By: Elsie Perone M.D.   On: 04/19/2024 13:30    Scheduled Meds:  apixaban   2.5 mg Oral BID   docusate sodium   100 mg Oral BID   escitalopram   15 mg Oral Daily   fluticasone   2 spray Each Nare Daily   lacosamide   200 mg Oral BID  levothyroxine   75 mcg Oral Q0600   metoprolol  tartrate  50 mg Oral BID   multivitamin  1 tablet Oral Daily   rosuvastatin   10 mg Oral QHS   Continuous Infusions:     LOS: 2 days   Ivonne Mustache, MD [facility] P2/07/2024, 11:16 AM  "

## 2024-04-21 NOTE — Evaluation (Signed)
 Physical Therapy Evaluation Patient Details Name: Theresa [name] MRN: [id number] DOB: 1931-04-30 Today's Date: 04/21/2024  History of Present Illness  Pt is a 89 y.o female admitted 2/3 from [facility] for syncope episode, came to with R sided weakness and AMS. CRI showed no acute changes. EEG showed 2 focal seizures in L & R frontotemporal region. PMH: afib, CVA, seizures, hypothyroidism, HLD, HTN, CKD, recent R hip sx 02/2024  Clinical Impression  Pt presents with admitting diagnosis above. Despite working very well with OT, pt had poor tolerance to PT today. Pt with zero command following requiring Mod A to stand and noted with extreme posterior lean. PTA pt is a long term resident at SNF where she was using a RW for mobility. Patient will benefit from continued inpatient follow up therapy, <3 hours/day. PT will continue to follow.         If plan is discharge home, recommend the following: Two people to help with walking and/or transfers;A lot of help with bathing/dressing/bathroom;Assistance with cooking/housework;Direct supervision/assist for medications management;Direct supervision/assist for financial management;Assist for transportation;Help with stairs or ramp for entrance;Supervision due to cognitive status   Can travel by private vehicle   No    Equipment Recommendations None recommended by PT  Recommendations for Other Services       Functional Status Assessment Patient has had a recent decline in their functional status and demonstrates the ability to make significant improvements in function in a reasonable and predictable amount of time.     Precautions / Restrictions Precautions Precautions: Fall Recall of Precautions/Restrictions: Impaired Restrictions Weight Bearing Restrictions Per Provider Order: No      Mobility  Bed Mobility Overal bed mobility: Needs Assistance Bed Mobility: Rolling, Supine to Sit, Sit to Supine Rolling: Max assist   Supine to  sit: Mod assist Sit to supine: Total assist   General bed mobility comments: Max A to roll for pad placement. Mod A for trunk elevation. Total A to return to bed due to poor command following.    Transfers Overall transfer level: Needs assistance Equipment used: Rolling walker (2 wheels) Transfers: Sit to/from Stand Sit to Stand: Mod assist           General transfer comment: Mod A due to extreme posterior lean against bed with pt picking up RW off the floor. standing marches.    Ambulation/Gait             Pre-gait activities: Standing marches General Gait Details: Deferred for safety.  Stairs            Wheelchair Mobility     Tilt Bed    Modified Rankin (Stroke Patients Only)       Balance Overall balance assessment: Needs assistance Sitting-balance support: No upper extremity supported, Feet supported Sitting balance-Leahy Scale: Fair     Standing balance support: Bilateral upper extremity supported, During functional activity, Reliant on assistive device for balance Standing balance-Leahy Scale: Poor Standing balance comment: Reliant on RW                             Pertinent Vitals/Pain Pain Assessment Pain Assessment: No/denies pain    Home Living Family/patient expects to be discharged to:: Skilled nursing facility                   Additional Comments: [facility]    Prior Function Prior Level of Function : Needs assist;Patient poor historian/Family  not available             Mobility Comments: Reports use of RW ADLs Comments: Reports mod I, with staff available to assist as needed     Extremity/Trunk Assessment   Upper Extremity Assessment Upper Extremity Assessment: Generalized weakness    Lower Extremity Assessment Lower Extremity Assessment: Generalized weakness    Cervical / Trunk Assessment Cervical / Trunk Assessment: Kyphotic  Communication   Communication Communication:  Impaired Factors Affecting Communication: Difficulty expressing self    Cognition Arousal: Alert Behavior During Therapy: WFL for tasks assessed/performed   PT - Cognitive impairments: Orientation, Awareness, Memory, Attention, Initiation, Sequencing, Problem solving, Safety/Judgement   Orientation impairments: Situation                   PT - Cognition Comments: Zero command following. Following commands: Impaired Following commands impaired: Follows one step commands inconsistently, Follows one step commands with increased time     Cueing Cueing Techniques: Verbal cues     General Comments General comments (skin integrity, edema, etc.): VSS. Pt in afib.    Exercises     Assessment/Plan    PT Assessment Patient needs continued PT services  PT Problem List Decreased strength;Decreased range of motion;Decreased activity tolerance;Decreased balance;Decreased mobility;Decreased coordination;Decreased cognition;Decreased knowledge of use of DME;Decreased safety awareness;Decreased knowledge of precautions;Cardiopulmonary status limiting activity       PT Treatment Interventions DME instruction;Gait training;Stair training;Functional mobility training;Therapeutic activities;Therapeutic exercise;Balance training;Neuromuscular re-education;Patient/family education;Cognitive remediation;Wheelchair mobility training    PT Goals (Current goals can be found in the Care Plan section)  Acute Rehab PT Goals Patient Stated Goal: to go home PT Goal Formulation: With patient Time For Goal Achievement: 05/05/24 Potential to Achieve Goals: Fair    Frequency Min 2X/week     Co-evaluation               AM-PAC PT 6 Clicks Mobility  Outcome Measure Help needed turning from your back to your side while in a flat bed without using bedrails?: A Lot Help needed moving from lying on your back to sitting on the side of a flat bed without using bedrails?: A Lot Help needed moving  to and from a bed to a chair (including a wheelchair)?: A Lot Help needed standing up from a chair using your arms (e.g., wheelchair or bedside chair)?: A Lot Help needed to walk in hospital room?: A Lot Help needed climbing 3-5 steps with a railing? : Total 6 Click Score: 11    End of Session Equipment Utilized During Treatment: Gait belt Activity Tolerance: Patient limited by fatigue;Other (comment) (Limited by cognitive deficits.) Patient left: in bed;with call bell/phone within reach;with bed alarm set;with nursing/sitter in room Nurse Communication: Mobility status PT Visit Diagnosis: Other abnormalities of gait and mobility (R26.89)    Time: 8795-8779 PT Time Calculation (min) (ACUTE ONLY): 16 min   Charges:   PT Evaluation $PT Eval Moderate Complexity: 1 Mod   PT General Charges $$ ACUTE PT VISIT: 1 Visit         Syla Devoss B, PT, DPT Acute Rehab Services [phone]   Shanya Ferriss 04/21/2024, 5:22 PM

## 2024-04-21 NOTE — Procedures (Addendum)
 Patient Name: Laikyn Gewirtz  MRN: [id number]  Epilepsy Attending: Arlin MALVA Krebs  Referring Physician/Provider: Waddell Karna LABOR, NP  Duration: 04/20/2024 1400 to 04/21/2024 1157   Patient history: 89yo F with seizure like episodes. EEG to evaluate for seizure   Level of alertness: Awake/lethargic, asleep   AEDs during EEG study: LCM   Technical aspects: This EEG study was done with scalp electrodes positioned according to the 10-20 International system of electrode placement. Electrical activity was reviewed with band pass filter of 1-70Hz , sensitivity of 7 uV/mm, display speed of 77mm/sec with a 60Hz  notched filter applied as appropriate. EEG data were recorded continuously and digitally stored.  Video monitoring was available and reviewed as appropriate.   Description: No clear posterior dominant rhythm was seen. Sleep was characterized by sleep spindles (13 to 15 Hz), maximal frontocentral region. EEG showed continuous generalized 3 to 6 Hz theta and delta slowing. Hyperventilation and photic stimulation were not performed.    seconds.  Parts of study were technically difficult due to significant electrode artifact   ABNORMALITY  - Continuous slow, generalized   IMPRESSION: This technically difficult study was suggestive of generalized cerebral dysfunction (encephalopathy). No seizures were noted.    Terre Zabriskie O Josey Dettmann

## 2024-04-21 NOTE — Plan of Care (Signed)
" °  Problem: Education: Goal: Knowledge of General Education information will improve Description: Including pain rating scale, medication(s)/side effects and non-pharmacologic comfort measures Outcome: Progressing   Problem: Health Behavior/Discharge Planning: Goal: Ability to manage health-related needs will improve Outcome: Progressing   Problem: Clinical Measurements: Goal: Ability to maintain clinical measurements within normal limits will improve Outcome: Progressing Goal: Will remain free from infection Outcome: Progressing Goal: Diagnostic test results will improve Outcome: Progressing Goal: Respiratory complications will improve Outcome: Progressing Goal: Cardiovascular complication will be avoided Outcome: Progressing   Problem: Activity: Goal: Risk for activity intolerance will decrease Outcome: Progressing   Problem: Nutrition: Goal: Adequate nutrition will be maintained Outcome: Progressing   Problem: Coping: Goal: Level of anxiety will decrease Outcome: Progressing   Problem: Elimination: Goal: Will not experience complications related to bowel motility Outcome: Progressing Goal: Will not experience complications related to urinary retention Outcome: Progressing   Problem: Pain Managment: Goal: General experience of comfort will improve and/or be controlled Outcome: Progressing   Problem: Safety: Goal: Ability to remain free from injury will improve Outcome: Progressing   Problem: Skin Integrity: Goal: Risk for impaired skin integrity will decrease Outcome: Progressing   Problem: Safety: Goal: Non-violent Restraint(s) Outcome: Progressing   Problem: Education: Goal: Knowledge of disease or condition will improve Outcome: Progressing Goal: Knowledge of secondary prevention will improve (MUST DOCUMENT ALL) Outcome: Progressing Goal: Knowledge of patient specific risk factors will improve (DELETE if not current risk factor) Outcome: Progressing    Problem: Ischemic Stroke/TIA Tissue Perfusion: Goal: Complications of ischemic stroke/TIA will be minimized Outcome: Progressing   Problem: Coping: Goal: Will verbalize positive feelings about self Outcome: Progressing Goal: Will identify appropriate support needs Outcome: Progressing   Problem: Health Behavior/Discharge Planning: Goal: Ability to manage health-related needs will improve Outcome: Progressing Goal: Goals will be collaboratively established with patient/family Outcome: Progressing   Problem: Self-Care: Goal: Ability to participate in self-care as condition permits will improve Outcome: Progressing Goal: Verbalization of feelings and concerns over difficulty with self-care will improve Outcome: Progressing Goal: Ability to communicate needs accurately will improve Outcome: Progressing   Problem: Nutrition: Goal: Risk of aspiration will decrease Outcome: Progressing Goal: Dietary intake will improve Outcome: Progressing   "

## 2024-04-22 MED ORDER — METOPROLOL TARTRATE 50 MG PO TABS
50.0000 mg | ORAL_TABLET | Freq: Three times a day (TID) | ORAL | Status: AC
  Administered 2024-04-22 (×2): 50 mg via ORAL
  Filled 2024-04-22 (×2): qty 1

## 2024-04-22 NOTE — [place] FL2 (Signed)
 " [place]  MEDICAID FL2 LEVEL OF CARE FORM     IDENTIFICATION  Patient Name: Theresa [name] Birthdate: 11-Nov-1931 Sex: female Admission Date (Current Location): 04/19/2024  [facility] [place] Number:  Producer, Television/film/video and Address:  [facility] [place], [address] [place], [place] [address]      Provider Number: [id number]  Attending Physician Name and Address:  Jillian Buttery, MD  Relative Name and Phone Number:       Current Level of Care: Hospital Recommended Level of Care: Skilled Nursing Facility Prior Approval Number:    Date Approved/Denied:   PASRR Number:    Discharge Plan: SNF    Current Diagnoses: Patient Active Problem List   Diagnosis Date Noted   Seizure (HCC) 04/19/2024   Acute encephalopathy 03/20/2024   Falls frequently 02/16/2024   Seizure disorder (HCC) 02/16/2024   Slow transit constipation 02/16/2024   MCI (mild cognitive impairment) 05/19/2023   Pain and swelling of right lower leg 04/06/2023   Fracture of femoral neck, right, closed (HCC) 03/03/2023   Depression 03/03/2023   Pain due to onychomycosis of toenails of both feet 12/23/2019   Balance disorder 05/03/2018   Macular degeneration 03/31/2017   Difficulty swallowing pills 11/26/2016   Hyperlipidemia 09/01/2014   CKD (chronic kidney disease), stage III (HCC) 03/02/2014   Rosacea 11/23/2013   Major depression in full remission (HCC) 11/23/2013   History of TIA (transient ischemic attack) and stroke 03/21/2013   Hypertension 03/04/2012   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 05/30/2011   History of colonic polyps 10/22/2009   CHANGE IN BOWELS 09/20/2009   GASTROESOPHAGEAL REFLUX DISEASE, MILD 06/04/2009   Raynaud's syndrome 03/06/2009   Idiopathic scoliosis and kyphoscoliosis 01/03/2009   Paroxysmal atrial fibrillation (HCC) 07/07/2007   Hypothyroidism 11/23/2006   Osteopenia 11/23/2006   History of mitral valve repair 09/11/2006    Osteoarthritis 09/11/2006    Orientation RESPIRATION BLADDER Height & Weight     Self, Time, Place  Normal Incontinent Weight: 126 lb 5.2 oz (57.3 kg) Height:  5' (152.4 cm)  BEHAVIORAL SYMPTOMS/MOOD NEUROLOGICAL BOWEL NUTRITION STATUS    Convulsions/Seizures Continent Diet (soft)  AMBULATORY STATUS COMMUNICATION OF NEEDS Skin   Extensive Assist Verbally Normal                       Personal Care Assistance Level of Assistance  Bathing, Feeding, Dressing Bathing Assistance: Maximum assistance Feeding assistance: Independent Dressing Assistance: Maximum assistance     Functional Limitations Info  Sight Sight Info: Impaired        SPECIAL CARE FACTORS FREQUENCY  PT (By licensed PT), OT (By licensed OT)     PT Frequency: 5x/wk OT Frequency: 5x/wk            Contractures Contractures Info: Not present    Additional Factors Info  Code Status, Allergies Code Status Info: DNR Allergies Info: Tape, Vioxx (Rofecoxib)           Current Medications (04/22/2024):  This is the current hospital active medication list Current Facility-Administered Medications  Medication Dose Route Frequency Provider Last Rate Last Admin   apixaban  (ELIQUIS ) tablet 2.5 mg  2.5 mg Oral BID Georgina Basket, MD   2.5 mg at 04/22/24 9048   docusate sodium  (COLACE) capsule 100 mg  100 mg Oral BID Georgina Basket, MD   100 mg at 04/22/24 9048   escitalopram  (LEXAPRO ) tablet 15 mg  15 mg Oral Daily Georgina Basket, MD  15 mg at 04/22/24 9048   fluticasone  (FLONASE ) 50 MCG/ACT nasal spray 2 spray  2 spray Each Nare Daily Georgina Basket, MD   2 spray at 04/22/24 1024   lacosamide  (VIMPAT ) tablet 200 mg  200 mg Oral BID Michaela Aisha SQUIBB, MD   200 mg at 04/22/24 9049   levothyroxine  (SYNTHROID ) tablet 75 mcg  75 mcg Oral Q0600 Georgina Basket, MD   75 mcg at 04/22/24 0559   metoprolol  tartrate (LOPRESSOR ) tablet 50 mg  50 mg Oral TID Jillian Buttery, MD   50 mg at 04/22/24 0957   multivitamin  (PROSIGHT) tablet 1 tablet  1 tablet Oral Daily Georgina Basket, MD   1 tablet at 04/22/24 9048   polyethylene glycol (MIRALAX  / GLYCOLAX ) packet 17 g  17 g Oral Daily PRN Georgina Basket, MD       rosuvastatin  (CRESTOR ) tablet 10 mg  10 mg Oral QHS Georgina Basket, MD   10 mg at 04/21/24 2238     Discharge Medications: Please see discharge summary for a list of discharge medications.  Relevant Imaging Results:  Relevant Lab Results:   Additional Information SS#: [id number]  Almarie CHRISTELLA Goodie, LCSW     "

## 2024-04-22 NOTE — Progress Notes (Signed)
 " PROGRESS NOTE  Theresa [name]  FMW:[id number] DOB: 1932-02-02 DOA: 04/19/2024 PCP: Charlanne Fredia CROME, MD   Brief Narrative: Patient is a 89 year old female from [facility] with history of A-fib on Eliquis , prior CVA, seizure disorder, hypothyroidism, hyperlipidemia, hypertension, CKD, depression, GERD who presented with sudden onset of confusion, unresponsiveness.  She was found slumped over and drooling while watching TV.  Initially called as code stroke.  Neurology consulted.  CT head, CT angiogram negative  for LVO.  MRI of the brain did not show any acute findings.  Metabolic encephalopathy was suspected  from seizure.  EEG showed focal seizure in the left frontotemporal region, right frontotemporal region but continuous EEG did not show any seizure, showed generalized cerebral dysfunction.  PT/OT consulted.  Recommended SNF.  Current plan is to continue Vimpat .  Still remains  confused today, remains in A-fib rhythm with elevated heart rate.  Plan to monitor another day before discharge back to SNF.  Assessment & Plan:  Principal Problem:   Seizure (HCC) Active Problems:   Acute encephalopathy  Acute encephalopathy secondary to seizure: History of seizures.  Spot EEG showed focal seizure in the left frontotemporal region, right frontotemporal region.  Neurology closely following.  CT imaging did not show any acute findings.  MRI did not show any acute findings, showed chronic ischemic and small vessel disease. Mentation has improved  but she still remains confused.  She is alert and awake and communicates,follows commands. Plan is to continue Vimpat  as per neurology.  No further recommendation.  Paroxysmal A-fib: Remains in A-fib rhythm.  On metoprolol  for rate control.  On Eliquis  for anticoagulation.  Dose of metoprolol  increased to 50 mg TID.  Heart rate in the range of 90-110  Hypertension: Continue the dose of metoprolol   Hyperlipidemia: On Crestor .  Hypothyroidism: On  Synthyroid  Depression: On Lexapro   Debility/deconditioning/advanced age/goals of care: Patient lives at [facility]/S[facility]. consulted PT/OT.  She has a power of attorney Rosaline Sharps.  CODE STATUS is DNI         DVT prophylaxis:apixaban  (ELIQUIS ) tablet 2.5 mg Start: 04/20/24 1000 SCDs Start: 04/19/24 1405 apixaban  (ELIQUIS ) tablet 2.5 mg     Code Status: Limited: Do not attempt resuscitation (DNR) -DNR-LIMITED -Do Not Intubate/DNI   Family Communication: Called Rosaline Sharps ,HCPOA on phone twice on 2/6,calls not received  Patient status:Inpatient  Patient is from :home  Anticipated discharge to:SNF  Estimated DC date:1-2 days   Consultants: Neurology  Procedures:Continous EEG  Antimicrobials:  Anti-infectives (From admission, onward)    None       Subjective: Patient seen and examined at bedside today.  Hemodynamically stable.  Mentation has improved but she still remains confused.  Eating her breakfast today.  Denies any complaints.  Obeys commands.  Remains in A-fib rhythm with heart rate more than 90  Objective: Vitals:   04/21/24 2300 04/22/24 0436 04/22/24 0853 04/22/24 0953  BP: (!) 149/87 139/89 (!) 165/109 (!) 149/95  Pulse: 98 98 94   Resp: 20 18 18 20   Temp: 99.7 F (37.6 C) 99.3 F (37.4 C) 98 F (36.7 C)   TempSrc: Axillary Oral Oral   SpO2: 100% 100% 99%   Weight:      Height:        Intake/Output Summary (Last 24 hours) at 04/22/2024 1110 Last data filed at 04/21/2024 1630 Gross per 24 hour  Intake 480 ml  Output --  Net 480 ml   Filed Weights   04/19/24 1629  Weight:  57.3 kg    Examination:   General exam: Overall comfortable, not in distress HEENT: PERRL Respiratory system:  no wheezes or crackles  Cardiovascular system: Irregularly irregular rhythm Gastrointestinal system: Abdomen is nondistended, soft and nontender. Central nervous system: Alert and, oriented to place only Extremities: No edema, no clubbing ,no  cyanosis Skin: No rashes, no ulcers,no icterus       Data Reviewed: I have personally reviewed following labs and imaging studies  CBC: Recent Labs  Lab 04/19/24 1058 04/19/24 1103 04/20/24 0802  WBC 11.2*  --  9.0  NEUTROABS 7.8*  --   --   HGB 13.0 13.3 11.8*  HCT 41.3 39.0 35.1*  MCV 101.0*  --  94.6  PLT 166  --  147*   Basic Metabolic Panel: Recent Labs  Lab 04/19/24 1058 04/19/24 1103 04/20/24 0802  NA 138 140 141  K 5.1 4.6 3.6  CL 104 104 106  CO2 22  --  26  GLUCOSE 159* 159* 95  BUN 18 20 17   CREATININE 1.14* 1.10* 0.79  CALCIUM  9.1  --  8.6*     No results found for this or any previous visit (from the past 240 hours).   Radiology Studies: No results found.   Scheduled Meds:  apixaban   2.5 mg Oral BID   docusate sodium   100 mg Oral BID   escitalopram   15 mg Oral Daily   fluticasone   2 spray Each Nare Daily   lacosamide   200 mg Oral BID   levothyroxine   75 mcg Oral Q0600   metoprolol  tartrate  50 mg Oral TID   multivitamin  1 tablet Oral Daily   rosuvastatin   10 mg Oral QHS   Continuous Infusions:     LOS: 3 days   Ivonne Mustache, MD [facility] P2/08/2024, 11:10 AM  "

## 2024-04-22 NOTE — TOC Progression Note (Addendum)
 Transition of Care [facility]) - Progression Note    Patient Details  Name: Theresa [name] MRN: [id number] Date of Birth: 04/22/1931  Transition of Care [facility]) CM/SW Contact  Theresa [name], Theresa [name] Phone Number: 04/22/2024, 12:39 PM  Clinical Narrative:   CSW updated by MD that patient could potentially be ready to return to SNF over the weekend. CSW spoke with [facility], confirmed they are able to accept patient back over the weekend. Patient will return to SNF. CSW to call the Healthcare Nursing Desk at [phone] when patient is ready for discharge to discuss patient's return. CSW spoke with patient's POA Theresa [name] and updated her on plan, as well. Theresa [name] appreciative of update. CSW to follow.  UPDATE 3:41 PM: CSW met with patient and POA at bedside to answer questions about transportation. Patient at this time would need stretcher transport, and patient and POA in agreement. CSW to follow.    Expected Discharge Plan: Skilled Nursing Facility Barriers to Discharge: Continued Medical Work up               Expected Discharge Plan and Services     Post Acute Care Choice: Skilled Nursing Facility Living arrangements for the past 2 months: Skilled Nursing Facility                                       Social Drivers of Health (SDOH) Interventions SDOH Screenings   Food Insecurity: No Food Insecurity (04/19/2024)  Housing: Low Risk (04/19/2024)  Transportation Needs: No Transportation Needs (04/19/2024)  Utilities: Not At Risk (04/19/2024)  Depression (PHQ2-9): Low Risk (11/19/2023)  Financial Resource Strain: Low Risk (03/02/2023)  Physical Activity: Insufficiently Active (03/02/2023)  Social Connections: Moderately Isolated (04/19/2024)  Stress: No Stress Concern Present (03/02/2023)  Tobacco Use: Medium Risk (04/19/2024)  Health Literacy: Adequate Health Literacy (03/02/2023)    Readmission Risk Interventions     No data to display

## 2024-04-22 NOTE — Plan of Care (Addendum)
 Patient remained stable throughout the shift, still is confused and fidgety, plan of care continued Problem: Clinical Measurements: Goal: Respiratory complications will improve Outcome: Progressing Goal: Cardiovascular complication will be avoided Outcome: Progressing   Problem: Nutrition: Goal: Adequate nutrition will be maintained Outcome: Progressing   Problem: Coping: Goal: Level of anxiety will decrease Outcome: Progressing   Problem: Elimination: Goal: Will not experience complications related to bowel motility Outcome: Progressing Goal: Will not experience complications related to urinary retention Outcome: Progressing   Problem: Skin Integrity: Goal: Risk for impaired skin integrity will decrease Outcome: Progressing

## 2024-04-22 NOTE — Plan of Care (Signed)
" °  Problem: Education: Goal: Knowledge of General Education information will improve Description: Including pain rating scale, medication(s)/side effects and non-pharmacologic comfort measures Outcome: Progressing   Problem: Health Behavior/Discharge Planning: Goal: Ability to manage health-related needs will improve Outcome: Progressing   Problem: Clinical Measurements: Goal: Ability to maintain clinical measurements within normal limits will improve Outcome: Progressing Goal: Will remain free from infection Outcome: Progressing Goal: Diagnostic test results will improve Outcome: Progressing Goal: Respiratory complications will improve Outcome: Progressing Goal: Cardiovascular complication will be avoided Outcome: Progressing   Problem: Activity: Goal: Risk for activity intolerance will decrease Outcome: Progressing   Problem: Nutrition: Goal: Adequate nutrition will be maintained Outcome: Progressing   Problem: Coping: Goal: Level of anxiety will decrease Outcome: Progressing   Problem: Elimination: Goal: Will not experience complications related to bowel motility Outcome: Progressing Goal: Will not experience complications related to urinary retention Outcome: Progressing   Problem: Pain Managment: Goal: General experience of comfort will improve and/or be controlled Outcome: Progressing   Problem: Safety: Goal: Ability to remain free from injury will improve Outcome: Progressing   Problem: Skin Integrity: Goal: Risk for impaired skin integrity will decrease Outcome: Progressing   Problem: Safety: Goal: Non-violent Restraint(s) Outcome: Progressing   Problem: Education: Goal: Knowledge of disease or condition will improve Outcome: Progressing Goal: Knowledge of secondary prevention will improve (MUST DOCUMENT ALL) Outcome: Progressing Goal: Knowledge of patient specific risk factors will improve (DELETE if not current risk factor) Outcome: Progressing    Problem: Ischemic Stroke/TIA Tissue Perfusion: Goal: Complications of ischemic stroke/TIA will be minimized Outcome: Progressing   Problem: Coping: Goal: Will verbalize positive feelings about self Outcome: Progressing Goal: Will identify appropriate support needs Outcome: Progressing   Problem: Health Behavior/Discharge Planning: Goal: Ability to manage health-related needs will improve Outcome: Progressing Goal: Goals will be collaboratively established with patient/family Outcome: Progressing   Problem: Self-Care: Goal: Ability to participate in self-care as condition permits will improve Outcome: Progressing Goal: Verbalization of feelings and concerns over difficulty with self-care will improve Outcome: Progressing Goal: Ability to communicate needs accurately will improve Outcome: Progressing   Problem: Nutrition: Goal: Risk of aspiration will decrease Outcome: Progressing Goal: Dietary intake will improve Outcome: Progressing   "

## 2024-10-10 ENCOUNTER — Ambulatory Visit: Admitting: Adult Health
